# Patient Record
Sex: Female | Born: 1960 | Race: White | Hispanic: No | Smoking: Former smoker
Health system: Southern US, Community
[De-identification: ages and names within clinical notes are randomized; demographics above are authoritative.]

## PROBLEM LIST (undated history)

## (undated) ENCOUNTER — Emergency Department (HOSPITAL_COMMUNITY): Payer: Medicaid Other

## (undated) DIAGNOSIS — I219 Acute myocardial infarction, unspecified: Secondary | ICD-10-CM

## (undated) DIAGNOSIS — J439 Emphysema, unspecified: Secondary | ICD-10-CM

## (undated) DIAGNOSIS — Z9889 Other specified postprocedural states: Secondary | ICD-10-CM

## (undated) DIAGNOSIS — F319 Bipolar disorder, unspecified: Secondary | ICD-10-CM

## (undated) DIAGNOSIS — IMO0001 Reserved for inherently not codable concepts without codable children: Secondary | ICD-10-CM

## (undated) DIAGNOSIS — E039 Hypothyroidism, unspecified: Secondary | ICD-10-CM

## (undated) DIAGNOSIS — S82002A Unspecified fracture of left patella, initial encounter for closed fracture: Secondary | ICD-10-CM

## (undated) DIAGNOSIS — I509 Heart failure, unspecified: Secondary | ICD-10-CM

## (undated) DIAGNOSIS — M549 Dorsalgia, unspecified: Secondary | ICD-10-CM

## (undated) DIAGNOSIS — Z9981 Dependence on supplemental oxygen: Secondary | ICD-10-CM

## (undated) DIAGNOSIS — M1990 Unspecified osteoarthritis, unspecified site: Secondary | ICD-10-CM

## (undated) DIAGNOSIS — S42301A Unspecified fracture of shaft of humerus, right arm, initial encounter for closed fracture: Secondary | ICD-10-CM

## (undated) DIAGNOSIS — S4291XA Fracture of right shoulder girdle, part unspecified, initial encounter for closed fracture: Secondary | ICD-10-CM

## (undated) DIAGNOSIS — R569 Unspecified convulsions: Secondary | ICD-10-CM

## (undated) DIAGNOSIS — K219 Gastro-esophageal reflux disease without esophagitis: Secondary | ICD-10-CM

## (undated) DIAGNOSIS — F339 Major depressive disorder, recurrent, unspecified: Secondary | ICD-10-CM

## (undated) DIAGNOSIS — G8929 Other chronic pain: Secondary | ICD-10-CM

## (undated) DIAGNOSIS — I050 Rheumatic mitral stenosis: Secondary | ICD-10-CM

## (undated) DIAGNOSIS — J449 Chronic obstructive pulmonary disease, unspecified: Secondary | ICD-10-CM

## (undated) DIAGNOSIS — R112 Nausea with vomiting, unspecified: Secondary | ICD-10-CM

## (undated) DIAGNOSIS — D696 Thrombocytopenia, unspecified: Secondary | ICD-10-CM

## (undated) DIAGNOSIS — I10 Essential (primary) hypertension: Secondary | ICD-10-CM

## (undated) DIAGNOSIS — G629 Polyneuropathy, unspecified: Secondary | ICD-10-CM

## (undated) DIAGNOSIS — F411 Generalized anxiety disorder: Secondary | ICD-10-CM

## (undated) DIAGNOSIS — E119 Type 2 diabetes mellitus without complications: Secondary | ICD-10-CM

## (undated) DIAGNOSIS — F4001 Agoraphobia with panic disorder: Secondary | ICD-10-CM

## (undated) DIAGNOSIS — Z720 Tobacco use: Secondary | ICD-10-CM

## (undated) DIAGNOSIS — Z9289 Personal history of other medical treatment: Secondary | ICD-10-CM

## (undated) DIAGNOSIS — D649 Anemia, unspecified: Secondary | ICD-10-CM

## (undated) DIAGNOSIS — I00 Rheumatic fever without heart involvement: Secondary | ICD-10-CM

## (undated) DIAGNOSIS — E7800 Pure hypercholesterolemia, unspecified: Secondary | ICD-10-CM

## (undated) DIAGNOSIS — R011 Cardiac murmur, unspecified: Secondary | ICD-10-CM

## (undated) DIAGNOSIS — J189 Pneumonia, unspecified organism: Secondary | ICD-10-CM

## (undated) HISTORY — PX: COLON SURGERY: SHX602

## (undated) HISTORY — PX: KNEE SURGERY: SHX244

## (undated) HISTORY — PX: INGUINAL HERNIA REPAIR: SUR1180

## (undated) HISTORY — PX: BACK SURGERY: SHX140

## (undated) HISTORY — PX: PATELLA FRACTURE SURGERY: SHX735

## (undated) HISTORY — PX: ORIF ANKLE FRACTURE: SUR919

## (undated) HISTORY — PX: SHOULDER SURGERY: SHX246

## (undated) HISTORY — PX: TONSILLECTOMY AND ADENOIDECTOMY: SUR1326

## (undated) HISTORY — PX: FOOT SURGERY: SHX648

## (undated) HISTORY — PX: TUBAL LIGATION: SHX77

---

## 1998-01-19 ENCOUNTER — Other Ambulatory Visit: Admission: RE | Admit: 1998-01-19 | Discharge: 1998-01-19 | Payer: Self-pay | Admitting: Family Medicine

## 1999-08-14 ENCOUNTER — Other Ambulatory Visit: Admission: RE | Admit: 1999-08-14 | Discharge: 1999-08-14 | Payer: Self-pay | Admitting: Family Medicine

## 2000-02-22 ENCOUNTER — Encounter: Payer: Self-pay | Admitting: Emergency Medicine

## 2000-02-22 ENCOUNTER — Inpatient Hospital Stay (HOSPITAL_COMMUNITY): Admission: EM | Admit: 2000-02-22 | Discharge: 2000-02-27 | Payer: Self-pay | Admitting: *Deleted

## 2000-10-20 ENCOUNTER — Encounter: Payer: Self-pay | Admitting: Ophthalmology

## 2000-10-20 ENCOUNTER — Ambulatory Visit (HOSPITAL_COMMUNITY): Admission: RE | Admit: 2000-10-20 | Discharge: 2000-10-20 | Payer: Self-pay | Admitting: Ophthalmology

## 2000-10-22 ENCOUNTER — Ambulatory Visit (HOSPITAL_COMMUNITY): Admission: RE | Admit: 2000-10-22 | Discharge: 2000-10-23 | Payer: Self-pay | Admitting: Ophthalmology

## 2001-02-15 ENCOUNTER — Emergency Department (HOSPITAL_COMMUNITY): Admission: EM | Admit: 2001-02-15 | Discharge: 2001-02-15 | Payer: Self-pay | Admitting: *Deleted

## 2001-06-07 ENCOUNTER — Emergency Department (HOSPITAL_COMMUNITY): Admission: EM | Admit: 2001-06-07 | Discharge: 2001-06-07 | Payer: Self-pay | Admitting: Emergency Medicine

## 2002-01-21 ENCOUNTER — Encounter: Payer: Self-pay | Admitting: Emergency Medicine

## 2002-01-21 ENCOUNTER — Emergency Department (HOSPITAL_COMMUNITY): Admission: EM | Admit: 2002-01-21 | Discharge: 2002-01-21 | Payer: Self-pay | Admitting: Emergency Medicine

## 2002-01-29 ENCOUNTER — Ambulatory Visit (HOSPITAL_BASED_OUTPATIENT_CLINIC_OR_DEPARTMENT_OTHER): Admission: RE | Admit: 2002-01-29 | Discharge: 2002-01-30 | Payer: Self-pay | Admitting: Orthopedic Surgery

## 2002-03-19 ENCOUNTER — Ambulatory Visit: Admission: RE | Admit: 2002-03-19 | Discharge: 2002-03-19 | Payer: Self-pay | Admitting: Orthopedic Surgery

## 2002-09-20 ENCOUNTER — Emergency Department (HOSPITAL_COMMUNITY): Admission: EM | Admit: 2002-09-20 | Discharge: 2002-09-20 | Payer: Self-pay | Admitting: Emergency Medicine

## 2002-09-20 ENCOUNTER — Encounter: Payer: Self-pay | Admitting: Emergency Medicine

## 2002-10-13 ENCOUNTER — Encounter: Payer: Self-pay | Admitting: Family Medicine

## 2002-10-13 ENCOUNTER — Encounter (INDEPENDENT_AMBULATORY_CARE_PROVIDER_SITE_OTHER): Payer: Self-pay | Admitting: Specialist

## 2002-10-13 ENCOUNTER — Encounter: Admission: RE | Admit: 2002-10-13 | Discharge: 2002-10-13 | Payer: Self-pay | Admitting: Family Medicine

## 2003-02-10 ENCOUNTER — Emergency Department (HOSPITAL_COMMUNITY): Admission: EM | Admit: 2003-02-10 | Discharge: 2003-02-10 | Payer: Self-pay | Admitting: Emergency Medicine

## 2003-04-07 ENCOUNTER — Ambulatory Visit (HOSPITAL_COMMUNITY): Admission: RE | Admit: 2003-04-07 | Discharge: 2003-04-07 | Payer: Self-pay | Admitting: Family Medicine

## 2003-04-07 ENCOUNTER — Encounter: Payer: Self-pay | Admitting: Family Medicine

## 2003-06-15 ENCOUNTER — Ambulatory Visit (HOSPITAL_COMMUNITY): Admission: RE | Admit: 2003-06-15 | Discharge: 2003-06-15 | Payer: Self-pay | Admitting: Family Medicine

## 2003-06-15 ENCOUNTER — Encounter: Payer: Self-pay | Admitting: Family Medicine

## 2003-06-27 ENCOUNTER — Encounter: Payer: Self-pay | Admitting: Emergency Medicine

## 2003-06-27 ENCOUNTER — Emergency Department (HOSPITAL_COMMUNITY): Admission: EM | Admit: 2003-06-27 | Discharge: 2003-06-27 | Payer: Self-pay | Admitting: *Deleted

## 2003-12-05 ENCOUNTER — Other Ambulatory Visit: Admission: RE | Admit: 2003-12-05 | Discharge: 2003-12-05 | Payer: Self-pay | Admitting: Family Medicine

## 2003-12-21 ENCOUNTER — Encounter: Admission: RE | Admit: 2003-12-21 | Discharge: 2003-12-21 | Payer: Self-pay | Admitting: Family Medicine

## 2004-02-07 ENCOUNTER — Inpatient Hospital Stay (HOSPITAL_COMMUNITY): Admission: RE | Admit: 2004-02-07 | Discharge: 2004-02-15 | Payer: Self-pay | Admitting: Psychiatry

## 2004-05-01 ENCOUNTER — Emergency Department (HOSPITAL_COMMUNITY): Admission: EM | Admit: 2004-05-01 | Discharge: 2004-05-01 | Payer: Self-pay | Admitting: Emergency Medicine

## 2004-09-12 ENCOUNTER — Ambulatory Visit: Payer: Self-pay | Admitting: Family Medicine

## 2005-01-17 ENCOUNTER — Ambulatory Visit: Payer: Self-pay | Admitting: Family Medicine

## 2005-01-31 ENCOUNTER — Ambulatory Visit (HOSPITAL_COMMUNITY): Admission: RE | Admit: 2005-01-31 | Discharge: 2005-01-31 | Payer: Self-pay | Admitting: Family Medicine

## 2005-02-05 ENCOUNTER — Encounter (INDEPENDENT_AMBULATORY_CARE_PROVIDER_SITE_OTHER): Payer: Self-pay | Admitting: *Deleted

## 2005-02-05 ENCOUNTER — Inpatient Hospital Stay (HOSPITAL_COMMUNITY): Admission: RE | Admit: 2005-02-05 | Discharge: 2005-02-07 | Payer: Self-pay | Admitting: Orthopedic Surgery

## 2005-06-30 ENCOUNTER — Emergency Department (HOSPITAL_COMMUNITY): Admission: EM | Admit: 2005-06-30 | Discharge: 2005-07-01 | Payer: Self-pay | Admitting: Emergency Medicine

## 2005-07-17 ENCOUNTER — Encounter: Admission: RE | Admit: 2005-07-17 | Discharge: 2005-08-02 | Payer: Self-pay | Admitting: Orthopedic Surgery

## 2005-08-14 ENCOUNTER — Ambulatory Visit: Payer: Self-pay | Admitting: Family Medicine

## 2005-08-21 ENCOUNTER — Emergency Department (HOSPITAL_COMMUNITY): Admission: EM | Admit: 2005-08-21 | Discharge: 2005-08-21 | Payer: Self-pay | Admitting: Emergency Medicine

## 2005-09-05 ENCOUNTER — Ambulatory Visit: Payer: Self-pay | Admitting: Family Medicine

## 2005-09-23 DIAGNOSIS — I219 Acute myocardial infarction, unspecified: Secondary | ICD-10-CM

## 2005-09-23 HISTORY — DX: Acute myocardial infarction, unspecified: I21.9

## 2005-11-22 ENCOUNTER — Ambulatory Visit: Payer: Self-pay | Admitting: Family Medicine

## 2006-06-17 ENCOUNTER — Inpatient Hospital Stay (HOSPITAL_COMMUNITY): Admission: RE | Admit: 2006-06-17 | Discharge: 2006-06-20 | Payer: Self-pay | Admitting: Orthopedic Surgery

## 2006-06-20 ENCOUNTER — Emergency Department (HOSPITAL_COMMUNITY): Admission: EM | Admit: 2006-06-20 | Discharge: 2006-06-21 | Payer: Self-pay | Admitting: Emergency Medicine

## 2006-07-01 ENCOUNTER — Ambulatory Visit: Payer: Self-pay | Admitting: Family Medicine

## 2006-07-18 ENCOUNTER — Encounter: Admission: RE | Admit: 2006-07-18 | Discharge: 2006-09-25 | Payer: Self-pay | Admitting: Orthopedic Surgery

## 2006-11-07 ENCOUNTER — Emergency Department (HOSPITAL_COMMUNITY): Admission: EM | Admit: 2006-11-07 | Discharge: 2006-11-08 | Payer: Self-pay | Admitting: Emergency Medicine

## 2006-12-04 ENCOUNTER — Ambulatory Visit: Payer: Self-pay | Admitting: Cardiology

## 2006-12-04 ENCOUNTER — Inpatient Hospital Stay (HOSPITAL_COMMUNITY): Admission: EM | Admit: 2006-12-04 | Discharge: 2006-12-07 | Payer: Self-pay | Admitting: Emergency Medicine

## 2006-12-11 ENCOUNTER — Ambulatory Visit: Payer: Self-pay | Admitting: Cardiovascular Disease

## 2006-12-19 ENCOUNTER — Inpatient Hospital Stay (HOSPITAL_COMMUNITY): Admission: RE | Admit: 2006-12-19 | Discharge: 2006-12-26 | Payer: Self-pay | Admitting: Orthopedic Surgery

## 2006-12-27 ENCOUNTER — Emergency Department (HOSPITAL_COMMUNITY): Admission: EM | Admit: 2006-12-27 | Discharge: 2006-12-27 | Payer: Self-pay | Admitting: *Deleted

## 2007-01-06 ENCOUNTER — Ambulatory Visit: Payer: Self-pay | Admitting: Family Medicine

## 2007-03-23 ENCOUNTER — Ambulatory Visit: Payer: Self-pay | Admitting: Family Medicine

## 2007-07-15 ENCOUNTER — Emergency Department (HOSPITAL_COMMUNITY): Admission: EM | Admit: 2007-07-15 | Discharge: 2007-07-15 | Payer: Self-pay | Admitting: Emergency Medicine

## 2007-08-17 ENCOUNTER — Encounter: Admission: RE | Admit: 2007-08-17 | Discharge: 2007-08-17 | Payer: Self-pay | Admitting: Orthopedic Surgery

## 2007-08-27 ENCOUNTER — Encounter: Admission: RE | Admit: 2007-08-27 | Discharge: 2007-11-25 | Payer: Self-pay | Admitting: Family Medicine

## 2007-09-22 ENCOUNTER — Inpatient Hospital Stay (HOSPITAL_COMMUNITY): Admission: RE | Admit: 2007-09-22 | Discharge: 2007-10-05 | Payer: Self-pay | Admitting: Orthopedic Surgery

## 2007-09-29 ENCOUNTER — Encounter (INDEPENDENT_AMBULATORY_CARE_PROVIDER_SITE_OTHER): Payer: Self-pay | Admitting: Orthopedic Surgery

## 2007-12-04 ENCOUNTER — Emergency Department (HOSPITAL_COMMUNITY): Admission: EM | Admit: 2007-12-04 | Discharge: 2007-12-05 | Payer: Self-pay | Admitting: Emergency Medicine

## 2007-12-15 ENCOUNTER — Inpatient Hospital Stay (HOSPITAL_COMMUNITY): Admission: RE | Admit: 2007-12-15 | Discharge: 2007-12-22 | Payer: Self-pay | Admitting: Orthopedic Surgery

## 2008-01-12 ENCOUNTER — Inpatient Hospital Stay (HOSPITAL_COMMUNITY): Admission: EM | Admit: 2008-01-12 | Discharge: 2008-01-14 | Payer: Self-pay | Admitting: Emergency Medicine

## 2008-02-18 ENCOUNTER — Encounter: Admission: RE | Admit: 2008-02-18 | Discharge: 2008-02-18 | Payer: Self-pay | Admitting: Orthopedic Surgery

## 2008-02-18 ENCOUNTER — Inpatient Hospital Stay (HOSPITAL_COMMUNITY): Admission: EM | Admit: 2008-02-18 | Discharge: 2008-02-22 | Payer: Self-pay | Admitting: Emergency Medicine

## 2008-02-29 ENCOUNTER — Observation Stay (HOSPITAL_COMMUNITY): Admission: EM | Admit: 2008-02-29 | Discharge: 2008-03-01 | Payer: Self-pay | Admitting: Emergency Medicine

## 2008-02-29 ENCOUNTER — Ambulatory Visit: Payer: Self-pay | Admitting: Surgery

## 2008-04-05 ENCOUNTER — Emergency Department (HOSPITAL_COMMUNITY): Admission: EM | Admit: 2008-04-05 | Discharge: 2008-04-05 | Payer: Self-pay | Admitting: Emergency Medicine

## 2008-06-07 ENCOUNTER — Emergency Department (HOSPITAL_COMMUNITY): Admission: EM | Admit: 2008-06-07 | Discharge: 2008-06-07 | Payer: Self-pay | Admitting: Emergency Medicine

## 2008-06-29 ENCOUNTER — Ambulatory Visit: Payer: Self-pay | Admitting: Surgery

## 2008-07-11 ENCOUNTER — Emergency Department (HOSPITAL_COMMUNITY): Admission: EM | Admit: 2008-07-11 | Discharge: 2008-07-12 | Payer: Self-pay | Admitting: Emergency Medicine

## 2008-07-12 ENCOUNTER — Inpatient Hospital Stay (HOSPITAL_COMMUNITY): Admission: EM | Admit: 2008-07-12 | Discharge: 2008-07-20 | Payer: Self-pay | Admitting: Emergency Medicine

## 2008-07-19 ENCOUNTER — Encounter (INDEPENDENT_AMBULATORY_CARE_PROVIDER_SITE_OTHER): Payer: Self-pay | Admitting: Gastroenterology

## 2008-08-16 ENCOUNTER — Inpatient Hospital Stay (HOSPITAL_COMMUNITY): Admission: RE | Admit: 2008-08-16 | Discharge: 2008-08-20 | Payer: Self-pay | Admitting: Orthopedic Surgery

## 2008-10-06 ENCOUNTER — Emergency Department (HOSPITAL_COMMUNITY): Admission: EM | Admit: 2008-10-06 | Discharge: 2008-10-07 | Payer: Self-pay | Admitting: Emergency Medicine

## 2009-08-07 ENCOUNTER — Other Ambulatory Visit: Payer: Self-pay | Admitting: Emergency Medicine

## 2009-08-07 ENCOUNTER — Ambulatory Visit: Payer: Self-pay | Admitting: Psychiatry

## 2009-08-07 ENCOUNTER — Inpatient Hospital Stay (HOSPITAL_COMMUNITY): Admission: AD | Admit: 2009-08-07 | Discharge: 2009-08-14 | Payer: Self-pay | Admitting: Psychiatry

## 2009-09-07 ENCOUNTER — Emergency Department (HOSPITAL_COMMUNITY): Admission: EM | Admit: 2009-09-07 | Discharge: 2009-09-07 | Payer: Self-pay | Admitting: Emergency Medicine

## 2009-09-09 ENCOUNTER — Emergency Department (HOSPITAL_COMMUNITY): Admission: EM | Admit: 2009-09-09 | Discharge: 2009-09-09 | Payer: Self-pay | Admitting: Emergency Medicine

## 2009-10-16 ENCOUNTER — Emergency Department (HOSPITAL_COMMUNITY): Admission: EM | Admit: 2009-10-16 | Discharge: 2009-10-17 | Payer: Self-pay | Admitting: Emergency Medicine

## 2010-03-20 ENCOUNTER — Inpatient Hospital Stay (HOSPITAL_COMMUNITY): Admission: EM | Admit: 2010-03-20 | Discharge: 2010-03-29 | Payer: Self-pay | Admitting: Emergency Medicine

## 2010-07-10 IMAGING — CT CT HEAD W/O CM
4 of 6 series · 14 of 30 positions shown, 16 images · non-contrast
Comparison: [DATE]

CLINICAL DATA: Altered level of consciousness.  Confusion.
Diabetic ketoacidosis.

CT HEAD WITHOUT CONTRAST
TECHNIQUE: Contiguous axial images were obtained from the base of
the skull through the vertex without contrast.

[Series 2: head_seq 4.5 h37s st · axial · 0.43mm/px · z∈[+904,+1030]mm · 8 of 36 slices shown, 10 images (1 of 4)]
[im 4/36  brain]
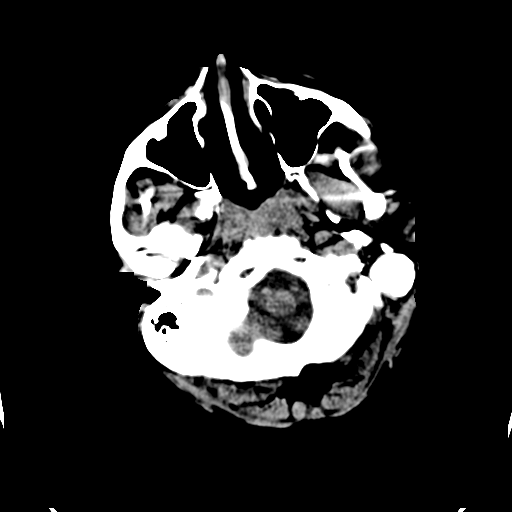
[im 4/36  bone]
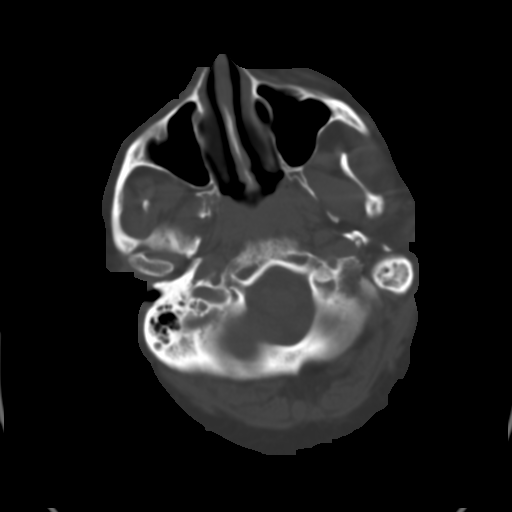
[im 8/36  brain]
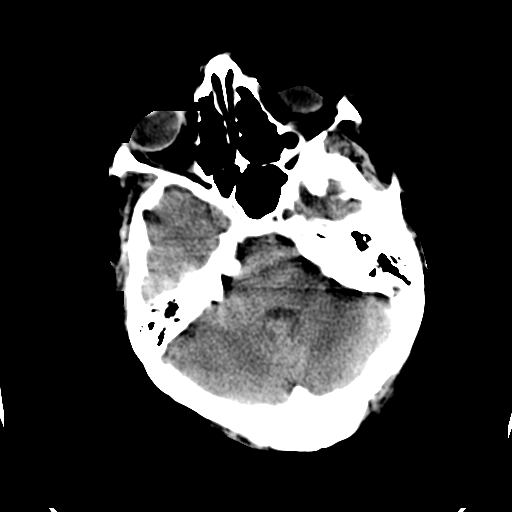
[im 12/36  brain]
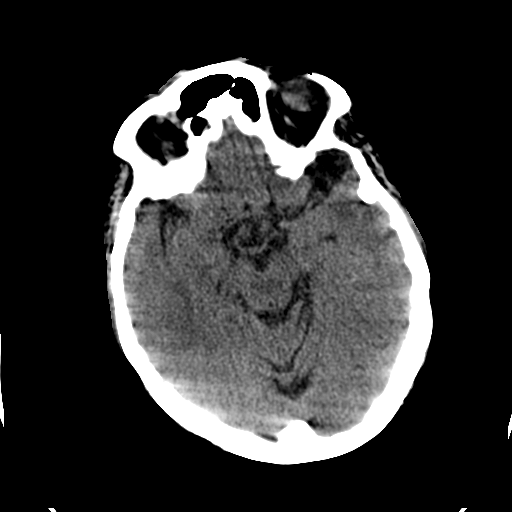
[im 16/36  brain]
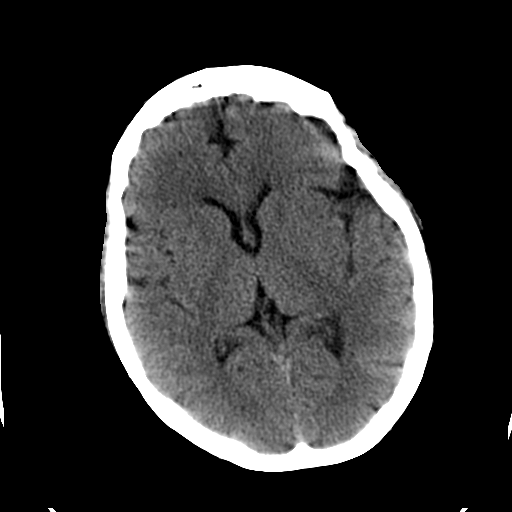
[im 20/36  brain]
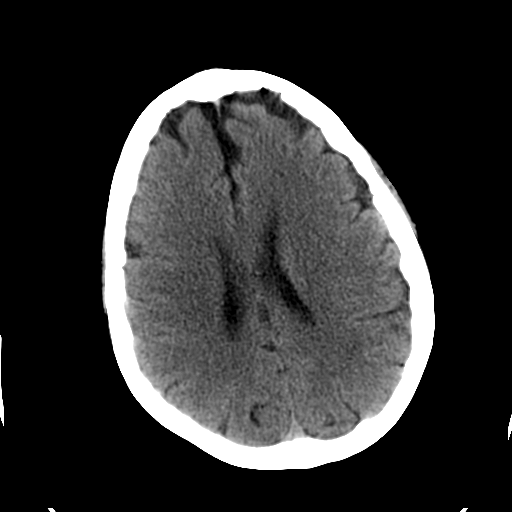
[im 20/36  bone]
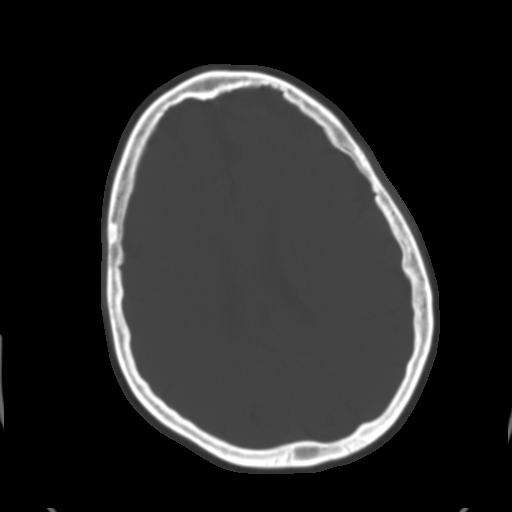
[im 24/36  brain]
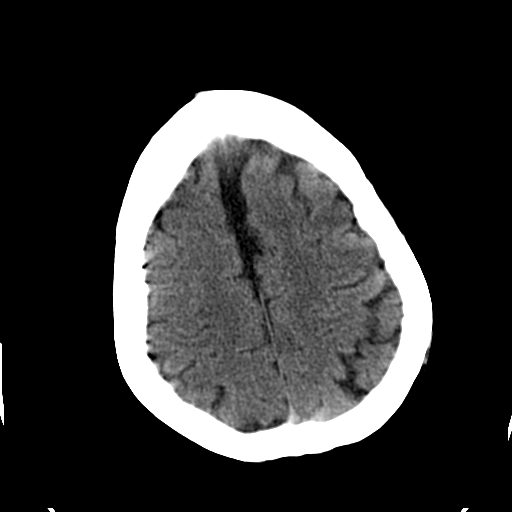
[im 28/36  brain]
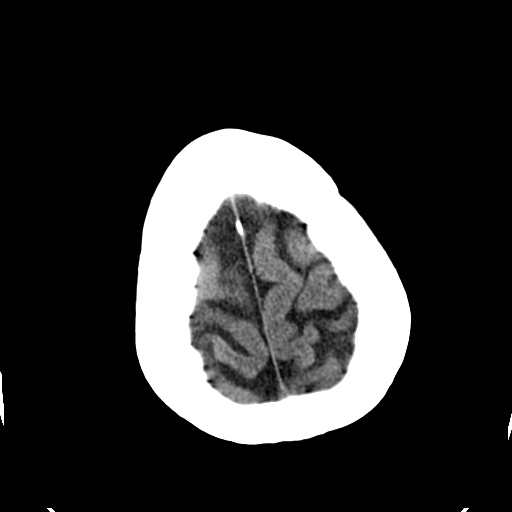
[im 32/36  brain]
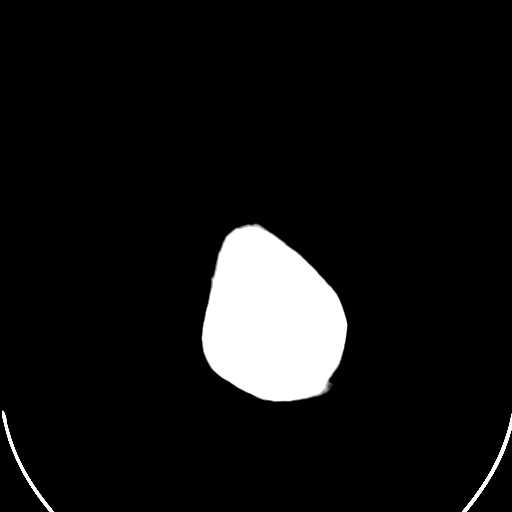

[Series 3: head_seq 4.5 h37s st · axial · 0.43mm/px · z∈[+913,+935]mm · 2 of 16 slices shown (2 of 4)]
[im 6/16  brain]
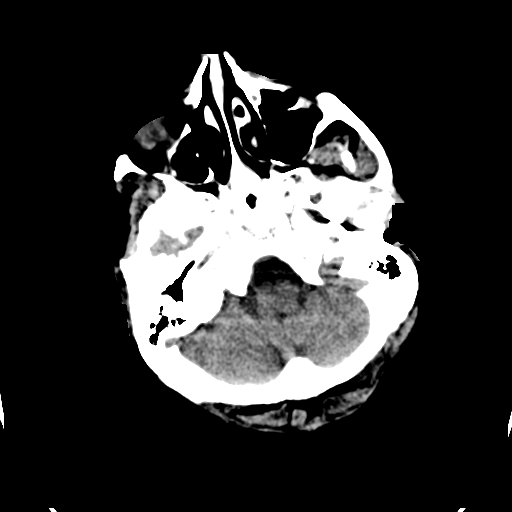
[im 11/16  brain]
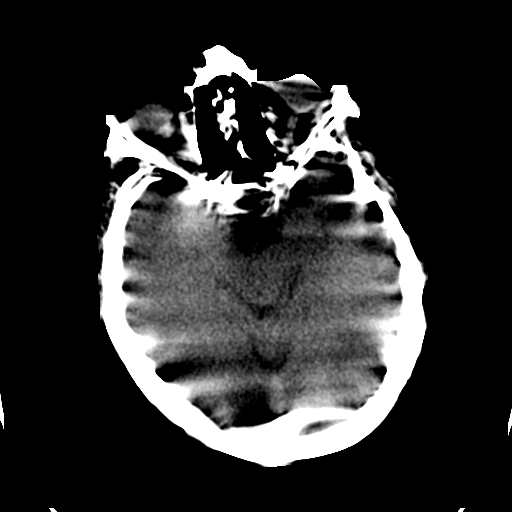

[Series 4: head_seq 4.5 h37s st · axial · 0.43mm/px · z∈[+913,+935]mm · 2 of 16 slices shown (3 of 4)]
[im 6/16  brain]
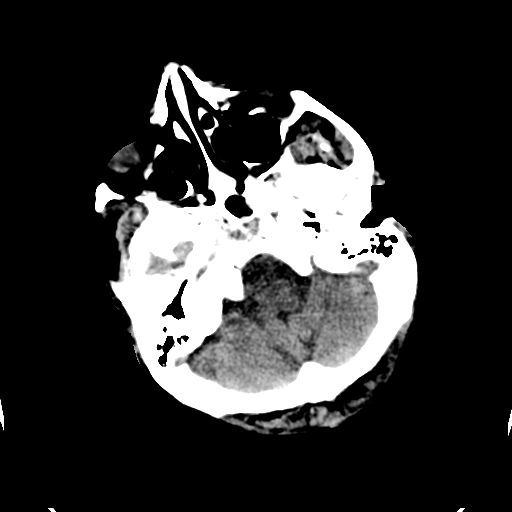
[im 11/16  brain]
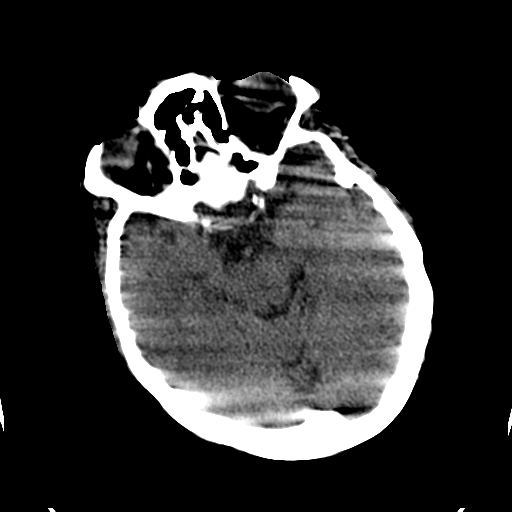

[Series 5: head_seq 4.5 h37s st · axial · 0.43mm/px · z∈[+913,+935]mm · 2 of 16 slices shown (4 of 4)]
[im 6/16  brain]
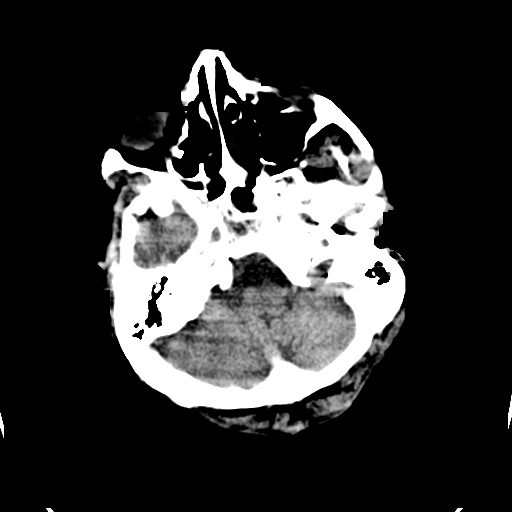
[im 11/16  brain]
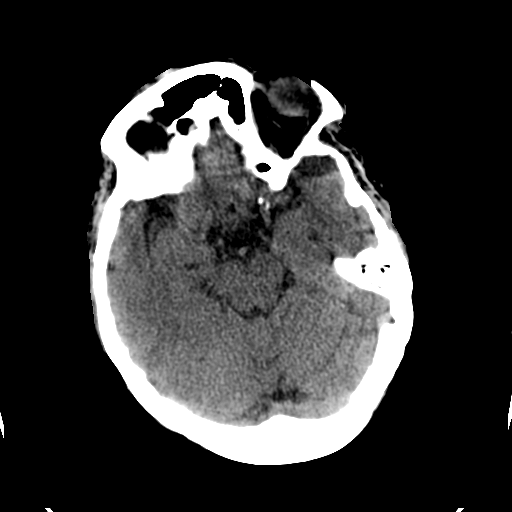

[14 of 30 positions shown; findings below may reference images not displayed]

FINDINGS: No change since previous examination.  There is mild
generalized atrophy but no evidence of acute infarction, mass
lesion, hemorrhage, hydrocephalus or extra-axial collection.
Calvarium is unremarkable.  The sinuses, middle ears and mastoids
are clear.
IMPRESSION: No change or acute finding.  Mild brain atrophy.

## 2010-10-13 ENCOUNTER — Encounter: Payer: Self-pay | Admitting: Internal Medicine

## 2010-10-15 ENCOUNTER — Encounter: Payer: Self-pay | Admitting: Orthopedic Surgery

## 2010-12-09 LAB — BASIC METABOLIC PANEL
BUN: 6 mg/dL (ref 6–23)
BUN: 15 mg/dL (ref 6–23)
Calcium: 9.2 mg/dL (ref 8.4–10.5)
Chloride: 102 mEq/L (ref 96–112)
Chloride: 84 mEq/L — ABNORMAL LOW (ref 96–112)
Creatinine, Ser: 0.51 mg/dL (ref 0.4–1.2)
Creatinine, Ser: 1.47 mg/dL — ABNORMAL HIGH (ref 0.4–1.2)
GFR calc Af Amer: >60 mL/min (ref >60)
GFR calc non Af Amer: >60 mL/min (ref >60)

## 2010-12-09 LAB — COMPREHENSIVE METABOLIC PANEL
AST: 12 U/L (ref 0–37)
AST: 14 U/L (ref 0–37)
Albumin: 3 g/dL — ABNORMAL LOW (ref 3.5–5.2)
Albumin: 3.2 g/dL — ABNORMAL LOW (ref 3.5–5.2)
Albumin: 3.3 g/dL — ABNORMAL LOW (ref 3.5–5.2)
Albumin: 3.3 g/dL — ABNORMAL LOW (ref 3.5–5.2)
Albumin: 3.6 g/dL (ref 3.5–5.2)
Alkaline Phosphatase: 86 U/L (ref 39–117)
Alkaline Phosphatase: 89 U/L (ref 39–117)
BUN: 11 mg/dL (ref 6–23)
BUN: 5 mg/dL — ABNORMAL LOW (ref 6–23)
BUN: 6 mg/dL (ref 6–23)
BUN: 8 mg/dL (ref 6–23)
Calcium: 8.5 mg/dL (ref 8.4–10.5)
Calcium: 8.9 mg/dL (ref 8.4–10.5)
Calcium: 9.3 mg/dL (ref 8.4–10.5)
Chloride: 96 mEq/L (ref 96–112)
Chloride: 97 mEq/L (ref 96–112)
Creatinine, Ser: 0.5 mg/dL (ref 0.4–1.2)
Creatinine, Ser: 0.55 mg/dL (ref 0.4–1.2)
GFR calc Af Amer: >60 mL/min (ref >60)
GFR calc Af Amer: >60 mL/min (ref >60)
GFR calc non Af Amer: >60 mL/min (ref >60)
Glucose, Bld: 147 mg/dL — ABNORMAL HIGH (ref 70–99)
Glucose, Bld: 127 mg/dL — ABNORMAL HIGH (ref 70–99)
Potassium: 2.8 mEq/L — ABNORMAL LOW (ref 3.5–5.1)
Potassium: 3.3 mEq/L — ABNORMAL LOW (ref 3.5–5.1)
Sodium: 136 mEq/L (ref 135–145)
Sodium: 131 mEq/L — ABNORMAL LOW (ref 135–145)
Total Bilirubin: 0.9 mg/dL (ref 0.3–1.2)
Total Bilirubin: 1 mg/dL (ref 0.3–1.2)
Total Bilirubin: 1.3 mg/dL — ABNORMAL HIGH (ref 0.3–1.2)
Total Protein: 5.5 g/dL — ABNORMAL LOW (ref 6.0–8.3)
Total Protein: 6.5 g/dL (ref 6.0–8.3)

## 2010-12-09 LAB — GLUCOSE, CAPILLARY
Glucose-Capillary: 122 mg/dL — ABNORMAL HIGH (ref 70–99)
Glucose-Capillary: 132 mg/dL — ABNORMAL HIGH (ref 70–99)
Glucose-Capillary: 158 mg/dL — ABNORMAL HIGH (ref 70–99)
Glucose-Capillary: 159 mg/dL — ABNORMAL HIGH (ref 70–99)
Glucose-Capillary: 159 mg/dL — ABNORMAL HIGH (ref 70–99)
Glucose-Capillary: 171 mg/dL — ABNORMAL HIGH (ref 70–99)
Glucose-Capillary: 193 mg/dL — ABNORMAL HIGH (ref 70–99)
Glucose-Capillary: 198 mg/dL — ABNORMAL HIGH (ref 70–99)
Glucose-Capillary: 207 mg/dL — ABNORMAL HIGH (ref 70–99)
Glucose-Capillary: 209 mg/dL — ABNORMAL HIGH (ref 70–99)
Glucose-Capillary: 210 mg/dL — ABNORMAL HIGH (ref 70–99)
Glucose-Capillary: 258 mg/dL — ABNORMAL HIGH (ref 70–99)
Glucose-Capillary: 267 mg/dL — ABNORMAL HIGH (ref 70–99)
Glucose-Capillary: 281 mg/dL — ABNORMAL HIGH (ref 70–99)
Glucose-Capillary: 292 mg/dL — ABNORMAL HIGH (ref 70–99)
Glucose-Capillary: 317 mg/dL — ABNORMAL HIGH (ref 70–99)
Glucose-Capillary: 329 mg/dL — ABNORMAL HIGH (ref 70–99)
Glucose-Capillary: 51 mg/dL — ABNORMAL LOW (ref 70–99)
Glucose-Capillary: 52 mg/dL — ABNORMAL LOW (ref 70–99)
Glucose-Capillary: 60 mg/dL — ABNORMAL LOW (ref 70–99)
Glucose-Capillary: 89 mg/dL (ref 70–99)
Glucose-Capillary: 231 mg/dL — ABNORMAL HIGH (ref 70–99)
Glucose-Capillary: 314 mg/dL — ABNORMAL HIGH (ref 70–99)
Glucose-Capillary: 131 mg/dL — ABNORMAL HIGH (ref 70–99)
Glucose-Capillary: 244 mg/dL — ABNORMAL HIGH (ref 70–99)

## 2010-12-09 LAB — DIFFERENTIAL
Basophils Absolute: 0 10*3/uL (ref 0.0–0.1)
Basophils Absolute: 0 10*3/uL (ref 0.0–0.1)
Basophils Absolute: 0 10*3/uL (ref 0.0–0.1)
Basophils Absolute: 0 10*3/uL (ref 0.0–0.1)
Basophils Absolute: 0.1 10*3/uL (ref 0.0–0.1)
Basophils Relative: 0 % (ref 0–1)
Basophils Relative: 1 % (ref 0–1)
Eosinophils Relative: 2 % (ref 0–5)
Eosinophils Relative: 3 % (ref 0–5)
Lymphocytes Relative: 22 % (ref 12–46)
Lymphocytes Relative: 23 % (ref 12–46)
Lymphocytes Relative: 23 % (ref 12–46)
Lymphs Abs: 1.8 10*3/uL (ref 0.7–4.0)
Lymphs Abs: 1.9 10*3/uL (ref 0.7–4.0)
Lymphs Abs: 1.9 10*3/uL (ref 0.7–4.0)
Lymphs Abs: 0.9 10*3/uL (ref 0.7–4.0)
Monocytes Absolute: 0.5 10*3/uL (ref 0.1–1.0)
Monocytes Absolute: 0.6 10*3/uL (ref 0.1–1.0)
Monocytes Absolute: 0.5 10*3/uL (ref 0.1–1.0)
Monocytes Absolute: 0.6 10*3/uL (ref 0.1–1.0)
Monocytes Relative: 7 % (ref 3–12)
Monocytes Relative: 7 % (ref 3–12)
Monocytes Relative: 4 % (ref 3–12)
Neutro Abs: 4.7 10*3/uL (ref 1.7–7.7)
Neutro Abs: 5.2 10*3/uL (ref 1.7–7.7)
Neutro Abs: 5.4 10*3/uL (ref 1.7–7.7)
Neutro Abs: 5.7 10*3/uL (ref 1.7–7.7)
Neutro Abs: 10.4 10*3/uL — ABNORMAL HIGH (ref 1.7–7.7)
Neutro Abs: 10.9 10*3/uL — ABNORMAL HIGH (ref 1.7–7.7)
Neutro Abs: 8.3 10*3/uL — ABNORMAL HIGH (ref 1.7–7.7)
Neutrophils Relative %: 66 % (ref 43–77)
Neutrophils Relative %: 66 % (ref 43–77)
Neutrophils Relative %: 68 % (ref 43–77)
Neutrophils Relative %: 80 % — ABNORMAL HIGH (ref 43–77)
Neutrophils Relative %: 87 % — ABNORMAL HIGH (ref 43–77)
Neutrophils Relative %: 88 % — ABNORMAL HIGH (ref 43–77)

## 2010-12-09 LAB — CBC
HCT: 41.7 % (ref 36.0–46.0)
HCT: 36.6 % (ref 36.0–46.0)
HCT: 40.4 % (ref 36.0–46.0)
Hemoglobin: 12.8 g/dL (ref 12.0–15.0)
Hemoglobin: 13.8 g/dL (ref 12.0–15.0)
MCH: 31.4 pg (ref 26.0–34.0)
MCH: 31.4 pg (ref 26.0–34.0)
MCH: 31 pg (ref 26.0–34.0)
MCHC: 34.1 g/dL (ref 30.0–36.0)
MCHC: 34.5 g/dL (ref 30.0–36.0)
MCHC: 34.7 g/dL (ref 30.0–36.0)
MCV: 90.7 fL (ref 78.0–100.0)
MCV: 91 fL (ref 78.0–100.0)
MCV: 90.6 fL (ref 78.0–100.0)
Platelets: 166 10*3/uL (ref 150–400)
Platelets: 168 10*3/uL (ref 150–400)
Platelets: 193 10*3/uL (ref 150–400)
Platelets: 214 10*3/uL (ref 150–400)
Platelets: 181 10*3/uL (ref 150–400)
RBC: 4.31 MIL/uL (ref 3.87–5.11)
RBC: 4.03 MIL/uL (ref 3.87–5.11)
RBC: 4.37 MIL/uL (ref 3.87–5.11)
RBC: 4.44 MIL/uL (ref 3.87–5.11)
RDW: 12.4 % (ref 11.5–15.5)
RDW: 12.4 % (ref 11.5–15.5)
RDW: 12.4 % (ref 11.5–15.5)
RDW: 12.8 % (ref 11.5–15.5)
WBC: 6.9 10*3/uL (ref 4.0–10.5)
WBC: 8.2 10*3/uL (ref 4.0–10.5)
WBC: 10.4 10*3/uL (ref 4.0–10.5)
WBC: 12.1 10*3/uL — ABNORMAL HIGH (ref 4.0–10.5)

## 2010-12-09 LAB — LIPID PANEL
Cholesterol: 176 mg/dL (ref 0–200)
Total CHOL/HDL Ratio: 3.1 RATIO

## 2010-12-09 LAB — URINALYSIS, ROUTINE W REFLEX MICROSCOPIC
Hgb urine dipstick: NEGATIVE
Nitrite: NEGATIVE
Specific Gravity, Urine: 1.01 (ref 1.005–1.030)
Urobilinogen, UA: 0.2 mg/dL (ref 0.0–1.0)

## 2010-12-09 LAB — MAGNESIUM: Magnesium: 1.8 mg/dL (ref 1.5–2.5)

## 2010-12-09 LAB — HEMOGLOBIN A1C: Hgb A1c MFr Bld: 7.7 % — ABNORMAL HIGH (ref <5.7)

## 2010-12-24 LAB — GLUCOSE, CAPILLARY: Glucose-Capillary: 370 mg/dL — ABNORMAL HIGH (ref 70–99)

## 2010-12-24 LAB — BASIC METABOLIC PANEL
BUN: 6 mg/dL (ref 6–23)
CO2: 27 mEq/L (ref 19–32)
Chloride: 101 mEq/L (ref 96–112)
Creatinine, Ser: 0.65 mg/dL (ref 0.4–1.2)
Glucose, Bld: 168 mg/dL — ABNORMAL HIGH (ref 70–99)

## 2010-12-24 LAB — DIFFERENTIAL
Basophils Relative: 1 % (ref 0–1)
Eosinophils Absolute: 0.1 10*3/uL (ref 0.0–0.7)
Neutrophils Relative %: 73 % (ref 43–77)

## 2010-12-24 LAB — RAPID URINE DRUG SCREEN, HOSP PERFORMED
Benzodiazepines: NOT DETECTED
Cocaine: NOT DETECTED

## 2010-12-24 LAB — ETHANOL: Alcohol, Ethyl (B): <5 mg/dL (ref 0–10)

## 2010-12-24 LAB — POCT I-STAT, CHEM 8
Creatinine, Ser: 0.6 mg/dL (ref 0.4–1.2)
Glucose, Bld: 389 mg/dL — ABNORMAL HIGH (ref 70–99)
Hemoglobin: 16.7 g/dL — ABNORMAL HIGH (ref 12.0–15.0)
Potassium: 3.6 mEq/L (ref 3.5–5.1)

## 2010-12-24 LAB — CBC
MCHC: 34.3 g/dL (ref 30.0–36.0)
MCV: 89 fL (ref 78.0–100.0)
Platelets: 285 10*3/uL (ref 150–400)
RDW: 12.8 % (ref 11.5–15.5)
WBC: 8.7 10*3/uL (ref 4.0–10.5)

## 2010-12-26 LAB — URINALYSIS, ROUTINE W REFLEX MICROSCOPIC
Bilirubin Urine: NEGATIVE
Glucose, UA: NEGATIVE mg/dL
Hgb urine dipstick: NEGATIVE
Hgb urine dipstick: NEGATIVE
Ketones, ur: NEGATIVE mg/dL
Nitrite: NEGATIVE
Protein, ur: NEGATIVE mg/dL
Protein, ur: NEGATIVE mg/dL
Specific Gravity, Urine: 1.011 (ref 1.005–1.030)
Urobilinogen, UA: 0.2 mg/dL (ref 0.0–1.0)
Urobilinogen, UA: 1 mg/dL (ref 0.0–1.0)
pH: 8 (ref 5.0–8.0)

## 2010-12-26 LAB — CBC
HCT: 42.5 % (ref 36.0–46.0)
Hemoglobin: 12.7 g/dL (ref 12.0–15.0)
Hemoglobin: 14.5 g/dL (ref 12.0–15.0)
MCHC: 32.9 g/dL (ref 30.0–36.0)
MCHC: 34.2 g/dL (ref 30.0–36.0)
MCV: 88.2 fL (ref 78.0–100.0)
Platelets: 242 K/uL (ref 150–400)
RBC: 4.32 MIL/uL (ref 3.87–5.11)
RBC: 4.82 MIL/uL (ref 3.87–5.11)
RDW: 12.6 % (ref 11.5–15.5)
WBC: 7 10*3/uL (ref 4.0–10.5)
WBC: 9.5 10*3/uL (ref 4.0–10.5)

## 2010-12-26 LAB — BASIC METABOLIC PANEL WITH GFR
BUN: 9 mg/dL (ref 6–23)
CO2: 33 meq/L — ABNORMAL HIGH (ref 19–32)
Calcium: 9.5 mg/dL (ref 8.4–10.5)
Chloride: 98 meq/L (ref 96–112)
Creatinine, Ser: 0.7 mg/dL (ref 0.4–1.2)
Glucose, Bld: 90 mg/dL (ref 70–99)

## 2010-12-26 LAB — GLUCOSE, CAPILLARY
Glucose-Capillary: 139 mg/dL — ABNORMAL HIGH (ref 70–99)
Glucose-Capillary: 155 mg/dL — ABNORMAL HIGH (ref 70–99)
Glucose-Capillary: 156 mg/dL — ABNORMAL HIGH (ref 70–99)
Glucose-Capillary: 156 mg/dL — ABNORMAL HIGH (ref 70–99)
Glucose-Capillary: 173 mg/dL — ABNORMAL HIGH (ref 70–99)
Glucose-Capillary: 193 mg/dL — ABNORMAL HIGH (ref 70–99)
Glucose-Capillary: 198 mg/dL — ABNORMAL HIGH (ref 70–99)
Glucose-Capillary: 264 mg/dL — ABNORMAL HIGH (ref 70–99)
Glucose-Capillary: 264 mg/dL — ABNORMAL HIGH (ref 70–99)
Glucose-Capillary: 323 mg/dL — ABNORMAL HIGH (ref 70–99)
Glucose-Capillary: 361 mg/dL — ABNORMAL HIGH (ref 70–99)
Glucose-Capillary: 369 mg/dL — ABNORMAL HIGH (ref 70–99)
Glucose-Capillary: 97 mg/dL (ref 70–99)
Glucose-Capillary: 99 mg/dL (ref 70–99)

## 2010-12-26 LAB — POCT TOXICOLOGY PANEL: Benzodiazepines: POSITIVE

## 2010-12-26 LAB — BASIC METABOLIC PANEL
GFR calc Af Amer: >60 mL/min (ref >60)
GFR calc non Af Amer: >60 mL/min (ref >60)
Potassium: 3.3 mEq/L — ABNORMAL LOW (ref 3.5–5.1)
Sodium: 139 mEq/L (ref 135–145)

## 2010-12-26 LAB — ETHANOL: Alcohol, Ethyl (B): <5 mg/dL (ref 0–10)

## 2011-01-07 LAB — URINALYSIS, ROUTINE W REFLEX MICROSCOPIC
Hgb urine dipstick: NEGATIVE
Specific Gravity, Urine: 1.039 — ABNORMAL HIGH (ref 1.005–1.030)
Urobilinogen, UA: 0.2 mg/dL (ref 0.0–1.0)

## 2011-01-07 LAB — DIFFERENTIAL
Basophils Absolute: 0 10*3/uL (ref 0.0–0.1)
Basophils Relative: 0 % (ref 0–1)
Eosinophils Absolute: 0.2 10*3/uL (ref 0.0–0.7)
Eosinophils Relative: 3 % (ref 0–5)
Lymphocytes Relative: 23 % (ref 12–46)
Monocytes Absolute: 0.4 10*3/uL (ref 0.1–1.0)

## 2011-01-07 LAB — RAPID URINE DRUG SCREEN, HOSP PERFORMED
Amphetamines: NOT DETECTED
Opiates: POSITIVE — AB
Tetrahydrocannabinol: NOT DETECTED

## 2011-01-07 LAB — URINE MICROSCOPIC-ADD ON

## 2011-01-07 LAB — GLUCOSE, CAPILLARY: Glucose-Capillary: 214 mg/dL — ABNORMAL HIGH (ref 70–99)

## 2011-01-07 LAB — POCT I-STAT, CHEM 8
Calcium, Ion: 1.15 mmol/L (ref 1.12–1.32)
Glucose, Bld: 119 mg/dL — ABNORMAL HIGH (ref 70–99)
HCT: 40 % (ref 36.0–46.0)
Hemoglobin: 13.6 g/dL (ref 12.0–15.0)
TCO2: 27 mmol/L (ref 0–100)

## 2011-01-07 LAB — ACETAMINOPHEN LEVEL: Acetaminophen (Tylenol), Serum: 11.2 ug/mL (ref 10–30)

## 2011-01-07 LAB — CBC
HCT: 38.2 % (ref 36.0–46.0)
Hemoglobin: 12.6 g/dL (ref 12.0–15.0)
MCHC: 32.9 g/dL (ref 30.0–36.0)
MCV: 88.7 fL (ref 78.0–100.0)
Platelets: 266 10*3/uL (ref 150–400)
RDW: 13.9 % (ref 11.5–15.5)

## 2011-01-07 LAB — SALICYLATE LEVEL: Salicylate Lvl: <4 mg/dL (ref 2.8–20.0)

## 2011-02-05 NOTE — Discharge Summary (Signed)
NAMEELAISHA, ZAHNISER                ACCOUNT NO.:  [id number]   MEDICAL RECORD NO.:  [id number]          PATIENT TYPE:  INP   LOCATION:  1510                         FACILITY:  [facility]   PHYSICIAN:  Fleet Contras, M.D.    DATE OF BIRTH:  1961-01-27   DATE OF ADMISSION:  07/12/2008  DATE OF DISCHARGE:  07/20/2008                               DISCHARGE SUMMARY   HISTORY OF PRESENT ILLNESS:  Please see the dictated H and P for full  details of presentation.  In summary, Ms. Piscitelli is a 50 year old  Caucasian lady with past medical history significant for type 2 diabetes  mellitus, systemic hypertension, dyslipidemia, depression, anxiety,  herniated lumbar disk.  She presented to the emergency room at [facility] with progressively worsening confusion, disorientation and  delirium after she had been to the emergency room a second time.  Her  workup including CT scan of the head, urinalysis and chest x-ray  essentially unrevealing on the first presentation, but subsequently she  was found to have a glucose of over 900, with large ketones in her blood  and low bicarb and elevated BUN and creatinine.  She was in diabetic  ketoacidosis, therefore admitted to the intensive care unit.   HOSPITAL COURSE:  On admission, the patient was started on intravenous  fluids, received hydration, as well as a glucose stabilizer protocol  with hourly monitoring of CBGs, strict input and output monitoring.  Within 24 hours, her glucose seemed to have normalized and she was able  to be transitioned to Lantus insulin.  However, she did not wake up and  her delirium did not clear.  A repeat CT scan of the head was performed  and again this showed no acute intracranial lesions.  Neurology consult  was requested and the patient was kindly seen by Dr. Sharene Skeans, who  concurred that this was most likely metabolic delirium and very likely  will clear with time.  He requested an EEG which was performed and  showed slow waves, compatible with metabolic encephalopathy.  Other  tests performed during this time including ammonia levels, TSH, sed  rate, ANA all returned within normal limits.  In the next 24-hours, the  patient became more alert and awake and was able to communicate.  She  was therefore transferred to a medical bed.  She had a low potassium of  2.8, and this was repleted both orally, as well as intravenously.  When  she started feeding, she complained of severe odynophagia despite  treatment with Protonix, Carafate and Maalox.  A gastroenterology  consult was requested and the patient was kindly seen by Dr. Evette Cristal, who  recommended that the patient should have upper GI endoscopy.  At this  time, she has had ultrasound scan of the abdomen that revealed no  gallstones and upper GI series did not show any ulcers or other  abnormalities.  Upper GI endoscopy was performed by Dr. Bosie Clos on  July 19, 2008, and showed severe erosive esophagitis.  He recommended  the patient should be on intravenous Protonix 40 mg q.12 for 24 hours,  and then transitioned to p.o. b.i.d. for 12 weeks.  He also recommended  the patient should be on Carafate 1 gm q.i.d., and this is what the  patient will be discharged home on.  This morning, she is able to  tolerate clear liquids without significant pain.   PHYSICAL EXAMINATION:  VITAL SIGNS:  Her vital signs are stable.  [name]  afebrile.  Her CBG this morning is 155.  CHEST:  Clear to auscultation.  HEART:  Heart sounds 1-2 are heard with no murmurs.  No S3 gallops.  No  rubs.  ABDOMEN:  Soft, nontender, no masses.  Bowel sounds present.  EXTREMITIES:  Shows no edema, calf tenderness or swelling.  NEUROLOGIC:  [name] alert, oriented x3 with no focal neurological  deficits.   [name] therefore considered stable for discharge home today.   DISCHARGE DIAGNOSES:  1. Erosive esophagitis.  2. Diabetic ketoacidosis, now resolved.  3. Hypokalemia,  resolved.  4. Acute renal failure, resolved.  5. Metabolic acidosis, resolved.  6. Severe dehydration, resolved.  7. Type 2 diabetes mellitus, moderately controlled.  8. Systemic hypertension, well-controlled.   DISCHARGE MEDICATIONS:  1. Protonix 40 mg p.o. b.i.d. for 12 weeks.  2. Carafate 1 gm p.o. q.i.d.  3. Hydrocodone/APAP 7.5/325 as [facility] by [facility].  4. Lipitor 10 mg daily.  5. Enalapril 20 mg daily.  6. Phenergan 25 mg p.o. q.6 p.r.n.  7. Hydrochlorothiazide 25 mg p.o. daily.  8. Robaxin 500 mg p.o. t.i.d. p.r.n.  9. Celexa 20 mg p.o. daily.  10.Wellbutrin 300 mg p.o. daily.  11.Hydroxyzine 25 mg p.o. q.6 p.r.n.   This discharge plan has been discussed with her and her questions  answered.   CONDITION ON DISCHARGE:  Stable.   DISPOSITION:  Home.   Follow-up will be with me in the office in about 2 weeks.      Fleet Contras, M.D.  Electronically Signed     EA/MEDQ  D:  07/20/2008  T:  07/20/2008  Job:  [id number]

## 2011-02-05 NOTE — Consult Note (Signed)
[name], [name]                ACCOUNT NO.:  [id number]   MEDICAL RECORD NO.:  [id number]          PATIENT TYPE:  INP   LOCATION:  1231                         FACILITY:  [facility]   PHYSICIAN:  Elizabeth Artis. [name], M.D.DATE OF BIRTH:  07-23-1961   DATE OF CONSULTATION:  07/14/2008  DATE OF DISCHARGE:                                 CONSULTATION   CHIEF COMPLAINT:  Altered mental status, hyperglycemia, febrile illness.   HISTORY OF PRESENT ILLNESS:  [name] is a 50 year old woman  admitted to [facility] and placed in the intensive care unit  in a setting of delirium with glucose of 958, which was corrected over a  period of less than 12 hours to normal.   The patient has had ongoing delirium.  I was asked to see her to  determine the etiology of her dysfunction and make recommendations for  further workup and treatment.   The patient presented to [facility] 12 hours before she was  admitted.  She came in with fever and mild delirium.  She was thought to  have a drug overdose, however, drug screen failed to show anything other  than opiates which she takes for chronic pain.  She had a glucose of  300.  She had low grade fever.  She was discharged home.  Twelve hours  later she came in, in much more severe delirium.   CT scan of the brain on July 11, 2008, as well as July 13, 2008,  shows no acute findings, no stroke, no hydrocephalus, no subdural and no  hemorrhage.   Family does not report history of seizures or syncope.  The patient has  not complained of chest pain or shortness of breath, orthopnea or  palpitations.  Currently she is complaining of some abdominal pain but  at that time had no vomiting or diarrhea.  She also had no dysuria, no  cough, hemoptysis, passages of blood her stools.  She had a very  extensive workup on the first evaluation including CT scan of the brain,  CT scan of the abdomen and pelvis; all this was unremarkable.   PAST MEDICAL HISTORY:  Remarkable for  1. Type 2 diabetes mellitus requiring insulin.  Last hemoglobin A1c      January 2009 was 6.6.  2. Systemic hypertension.  3. Dyslipidemia.  4. Depression.  5. Anxiety disorder.  6. Herniated lumbar disk.  7. Possible deep vein thrombosis.   PAST SURGICAL HISTORY:  Lumbar laminectomy with L4-L5 fusion March 2009  by Dr. Alveda Reasons.   MEDICATIONS ON ADMISSION:  1. Lantus insulin 40 units at bedtime.  2. Humalog sliding scale.  3. Actos 30 mg daily.  4. Enalapril 20 mg daily.  5. Celexa 20 mg daily.  6. Hydrochlorothiazide 25 mg daily.  7. Lipitor 10 mg daily.  8. Xanax 0.25 mg twice daily as needed.  9. Wellbutrin XL 300 mg twice daily.  10.Norco 5/225 one every 6 hours as needed.  11.Reglan 10 mg 3 times a day before meals.  12.Prilosec 40 mg daily.  13.Phenergan 25 mg every 6 hours  as needed.   DRUG ALLERGIES:  CELEBREX, response unknown.   REVIEW OF SYSTEMS:  As noted above.   FAMILY HISTORY:  Unknown.   SOCIAL HISTORY:  The patient lives with her family.  Family has not been  present much during this hospitalization.  I cannot say more about that.  The ER note said that she did not use tobacco, alcohol or drugs.   PHYSICAL EXAMINATION:  GENERAL APPEARANCE:  The patient is awake.  She  tells me her name.  She will sometimes start a sentence and not finish  it.  When I ask her if she hurt, she said no; later on she said my  stomach, my stomach.  VITAL SIGNS:  Blood pressure 154/82, resting pulse 94, respirations 19,  temperature 98.5.  HEENT:  Tympanic membranes are normal.  Pharynx I cannot see.  Conjunctivae are not inflamed.  NECK:  Supple.  There are no cranial or cervical bruits.  LUNGS:  Clear.  No dyspnea.  She has a nonproductive cough.  HEART:  No murmurs.  Pulses normal.  ABDOMEN:  Protuberant.  Bowel sounds normal.  Mildly tender.  No  hepatosplenomegaly.  EXTREMITIES:  Well-formed, however, she has tight heel cords.   She has  obvious operations at the ankle which appeared to be some form of ankle  stabilization procedure.  Neurologic:  Mental status is has been described above.  The patient is  in delirium.  She tends to keep her eyes closed.  She will briefly open  them and then close them again.  She would not follow commands other  than to protrude her tongue.  She did not tell me where she was.  She  told my physician's assistant that she was in [place], could not tell  us where exactly she was.  Cranial nerves:  Round reactive pupils.  She  has definite photophobia.  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  Disk margins appeared  sharp.  I could not test visual fields.  Extraocular movements could not  be tested easily.  She has symmetric facial strength and midline tongue.  I could not see in her pharynx.  Motor examination:  The patient moves  all four extremities.  She can keep her arms in the space for a few  seconds at a time before they drift down.  She will not wiggle her  fingers.  She did not grasp.  She definitely withdraws to noxious  stimuli.  I can prop her legs on the bed, bend it at the knee with her  foot on the bed bilaterally.  She withdraws to noxious stimuli in her  legs as well.  Deep tendon reflexes were symmetric and normal.  There  were no crossed adductor.  There is no ankle clonus.  The patient  appears to have bilateral extensor plantar responses though she seems to  have hammertoes and the toes appear to be slightly in extension even at  rest.   IMPRESSION:  Delirium, etiology unknown.  (293.0)  I have some concerns  that this may be related not only to her hyperglycemia but rapid  correction.  The patient's fever is declining rapidly.  I do not think  that she has sepsis or meningitis.  [name] not showing signs of  seizures.  She has no focal features.  It is unlikely the patient has a  structural abnormality of her  brain.  It is unlikely that she  has  meningoencephalitis.   RECOMMENDATIONS:  1. EEG.  2. MRI scan would be helpful, but unless she calms down, she would      have to go under conscious sedation and be transferred to the [place] [place] temporarily for that procedure.      Similarly, I do not think that lumbar puncture is needed at this      time.  If she shows spiking fevers or develops stiff neck or      delivery worsens, then it would be necessary.   I appreciate the opportunity to participate in her care.  I would limit  the number of sedatives that she has and restrain her as needed.  I  suspect that this will clear with time.  I appreciate the opportunity to  participate in her care and will continue to follow along with you.      [name], M.D.  Electronically Signed     WHH/MEDQ  D:  07/14/2008  T:  07/14/2008  Job:  [id number]

## 2011-02-05 NOTE — Op Note (Signed)
NAMEVICI, NOVICK                ACCOUNT NO.:  [id number]   MEDICAL RECORD NO.:  [id number]          PATIENT TYPE:  INP   LOCATION:  1615                         [facility]ACILITY:  [facility]   PHYSICIAN:  Deidre Ala, M.D.    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   POSTOPERATIVE DIAGNOSIS:  DJD (degenerative joint disease) end-stage  left knee.   PROCEDURE:  Left total knee arthroplasty using cemented DePuy components  LCS type with rotating platform cemented MBT revision type stem.   SURGEON:  1. Charlesetta Shanks, M.D.   ASSISTANT:  Phineas Semen, P.A.   ANESTHESIA:  General with femoral nerve block.   CULTURES:  None.   DRAINS:  Two medium Hemovac to self suction.   ESTIMATED BLOOD LOSS:  Minimal.   TOURNIQUET TIME:  1 hour and 23 minutes.   PATHOLOGIC FINDINGS AND HISTORY:  [name] is a rather miserable  person with terrible back pain, end-stage DJD of the knee, failure of  cortisone injections, chronic knee pain who elected to proceed with left  total knee arthroplasty.  At surgery classic findings were noted.  We  fitted her to a 2.5 tibia revision cemented tray, a size medium femur, a  10 mm rotating platform, a 75 x 14 mm fluted stem and an oval dome three  peg all poly patella 32 mm.  In any case, she had full extension with  flexion to 100 degrees, stable to varus-valgus stressing, good patellar  tilt and track with a slight patellar release carried out.   PROCEDURE IN DETAIL:  With adequate anesthesia obtained using LMA  technique and femoral nerve block the patient was placed in a supine  position.  The left lower extremity was prepped from the toes to the  tourniquet in standard fashion.  After standard prepping and draping  Esmarch exsanguination was used.  The tourniquet was let up to 350 mmHg.  We then made a median parapatellar skin incision followed by a median  parapatellar retinacular incision.  The incision was deepened sharply  with a knife and hemostasis was obtained using the Bovie  electrocoagulator.  Dissection was carried down through the retinaculum.  The patella everted, the fat pad excised and then we removed both  cruciates and the menisci.  I then amputated the tibial spine, smoothed  it flat and started drilling with the intermedullary drill and reamed up  to a 14.  The tibial cutting jig 2 degree was put in place and the cut  made.  I cut 2.5 mm and 2 mm more.  I then sized the femur to a medium,  placed intermedullary guide, placed a C clamp with it moved up to  bringing it anterior which gave [place] a 10 mm flexion gap.  Anterior/posterior cuts were made and the 10 fit in flexion.  I then  placed the fourth degree distal femoral cutting jig in place and made  the cut  on the first cut but had to cut 5 mm more to fit 10 in  flexion/extension.  The finishing guide on the femur was then put in  place and those cuts made as well as the drill holes.  I then sized the  femur to a 2.5, reamed the proximal ring then the stem ring then placed  the trial with the 14 x 75 with a keel punch.  I then placed on the  femoral component and articulated the knee through a full range of  motion with full extension and appropriate valgus angle.  I then used  the patellar cutting jig to cut down 8 mm appropriately thinning the  patella to accept an 8 mm 32 all poly button.  The template was used to  drill those holes.  The patella button was then put in place and the  knee articulated through a range of motion with good track with a  lateral release carried out.  All trial components were then removed  while the knee was thoroughly jet lavaged and components came on the  field and were sized.  We mixed cement with the vancomycin and we then  assembled the tibial component and cemented  it, impacted it and removed  excess cement.  We placed the 10 mm rotating platform.  We then cemented  on the femoral component, impacted it, removed excess cement.  We then  placed the patella in place, impacted it and removed excess cement and  held the knee, held it until it cured.  The knee was then articulated  through a range of motion.  When the cement had cured the tourniquet was  let down.  Bleeding points were cauterized.  Hemovac drains were placed  in the medial and lateral gutter and brought out through the  superolateral portal.  Additional jet lavage was carried out.  FloSeal  was placed in the wound.  The wound was then closed in layers with #1  Vicryl on the retinaculum, 0 and 2-0 Vicryl on the subcu and skin  staples.  We did use a running locking over sew of #1 PDS on the  retinaculum.  Hemovacs were hooked up to self suction.  A bulky sterile  compressive dressing was applied with knee immobilizer and the patient  having tolerated the procedure well was awakened, taken to the recovery  room in satisfactory condition to be admitted for routine postoperative  care, CPM and analgesia.      Deidre Ala, M.D.  Electronically Signed     VEP/MEDQ  D:  08/16/2008  T:  08/17/2008  Job:  [id number]   cc:   Fleet Contras, M.D.  Fax: [phone]

## 2011-02-05 NOTE — H&P (Signed)
[name], [name]                ACCOUNT NO.:  [id number]   MEDICAL RECORD NO.:  [id number]          PATIENT TYPE:  INP   LOCATION:  4732                         FACILITY:  [facility]   PHYSICIAN:  Fleet Contras, M.D.    DATE OF BIRTH:  1960/12/15   DATE OF ADMISSION:  01/13/2008  DATE OF DISCHARGE:                              HISTORY & PHYSICAL   PRESENTING COMPLAINTS:  1. Chest pain.  2. Back pain.  3. Leg pains.   HISTORY OF PRESENT ILLNESS:  [name] is a 50 year old Caucasian lady  with past medical history significant for type 2 diabetes mellitus,  systemic hypertension, hyperlipidemia, depression, anxiety and herniated  lumbar disk, status post L4-L5 spondylolisthesis fusion performed on  December 15, 2007.  she presented to Emergency Room at [facility]  with 4 to 5 hours history of chest pain mainly in her left shoulder and  then moving to have central chest, which she says started while she was  home.  She felt nervous and anxious and also had pains in her lower back  and also in her legs.  [name] about a month out from recent surgery on  her lower back performed by Dr. Patrica Duel and she has been ambulating at  home with a walker.  She denied having any nausea, diaphoresis, or  shortness of breath with this pain.  The pain improved once she go to  the hospital and received some Vicodin at the emergency room.  She had  no fevers, no cough, or sputum production, or no hemoptysis.  She denied  having any palpitations, orthopnea, or PND.  She has also had chronic  low back pain and recently worse after her surgery on her lower back but  has been slowly getting better.  The pain radiated to her legs and had  become pain medication postoperatively.  At the emergency room, her  vital signs were stable, however she was in painful distress and could  not ambulate and was therefore admitted to the hospital for further  evaluation and appropriate management.   PAST MEDICAL  HISTORY:  1. Type 2 diabetes mellitus.  Her last hemoglobin A1c on October 22, 2007 was 6.6  2. Systemic hypertension.  3. Hyperlipidemia.  Her last total cholesterol was 130, triglycerides      106, LDL 39, HDL 70 on October 22, 2007.  4. Depression.  5. Anxiety disorder.  6. Herniated lumbar disk.   PAST SURGICAL HISTORY:  Lumbar spine surgeries were last one being the  fusion of L4-L5 on December 14, 2007.   MEDICATION HISTORY:  1. [name] on Lantus insulin 40 units at bedtime.  2. Humalog insulin per sliding scale.  3. Actos 30 mg daily.  4. Enalapril 20 mg daily.  5. Celexa 20 mg daily.  6. Hydrochlorothiazide 25 mg daily.  7. Lipitor 10 mg daily.  8. Xanax 0.25 mg b.i.d.  9. Wellbutrin XL 300 mg one p.o. b.i.d.  10.Norco 5/325 one p.o. q. 6 p.r.n.  11.Reglan 10 mg t.i.d.  12.Prilosec 40 mg daily.  13.Phenergan 25 mg q. 6 p.r.n.   ALLERGIES:  [name] allergic to CELEBREX which gives her stomachache.   SOCIAL HISTORY:  [name] married and lives with her family.  She has one  brother and two daughters who are alive and well.  Both parents are  deceased.  She does not smoke cigarettes or use alcohol or illicit  drugs.   REVIEW OF SYSTEMS:  Essentially as above.   PHYSICAL EXAMINATION:  GENERAL:  [name] lying in the hospital bed in  moderate painful distress in her lower back.  She denies any chest pain  at the movement.  She denied any acute respiratory distress.  VITAL SIGNS:  Blood pressure of 94/55, heart rate of 72 regular,  respiratory rate of 28, temperature 97.4, and O2 sats on room air is  99%.  [name] not pale.  [name] not icteric.  [name] not cyanosed.  She  is well-hydrated, oral mucosa is moist.  NECK:  Supple with no elevated JVD.  No cervical lymphadenopathy.  No  carotid bruit.  CHEST:  Good air entry bilaterally with no rales, rhonchi, or wheezes.  HEART:  Heart sounds S1 and S2 are heard with no murmurs, no S3 or  gallops.  ABDOMEN:  Obese, soft,  nontender.  No masses.  Bowel sounds are present.  EXTREMITIES:  No edema, no calf tenderness, or swelling.  LUMBAR SPINE:  Shows healing wounds over her lower lumbar spine with no  obvious redness or drainage.  CNS:  [name] alert, oriented x3 with no focal neurological deficits.   LABORATORY DATA:  Her white count is 5.4.  Hemoglobin 10.3, hematocrit  30.6, and platelet count of 323.  Sodium is 127, potassium 5.0, chloride  91, bicarbonate of 29, BUN is 9, creatinine 0.84, and glucose of 320.  D-  dimer is 3.87.  Chest x-ray shows no acute infiltrates.  Urinalysis  positive for small leukocyte esterase and 3 to 6 WBC per high-power  field.   ASSESSMENT:  [name] is a 50 year old Caucasian lady with multiple  medical problems presented to the emergency room with acute onset of  chest pain associated with anxiety.  She also has chronic low back pain,  recently was seen by surgery of her lumbar spine.  She was already to  the hospital to rule out acute coronary syndrome and control of her  pain.   ADMISSION DIAGNOSES:  1. Atypical chest pain, rule out myocardial infarction.  2. Chronic low back pain secondary to the lumbar spondylolisthesis      status post L4-L5 fusion a month ago.  3. Hypokalemia repleted.  4. Urinary tract infection likely related to recent catheterization      after lumbar spine surgery.  5. Type 2 diabetes mellitus poorly controlled.   PLAN OF CARE:  She will be on telemetry bed.  Serial CKs and troponin  were performed to rule out acute coronary syndrome with Cipro 500 mg  b.i.d. for 7 days.  CT scan of the chest to rule out acute pulmonary  embolism  or DVT and GI prophylaxis.  CBG is a.c. and nightly and cooperative  sliding scale NovoLog insulin per moderate protocol.  PT consult was  requested to help ambulate. This plan of care has been discussed with  the patient and her husband and their questions answered.      Fleet Contras, M.D.   Electronically Signed     EA/MEDQ  D:  01/13/2008  T:  01/14/2008  Job:  [id number]

## 2011-02-05 NOTE — Procedures (Signed)
DUPLEX DEEP VENOUS EXAM - LOWER EXTREMITY   INDICATION:  Right lower extremity pain with previous swelling.   HISTORY:  Edema:  No, had previous swelling until was put on fluid  pill.  Trauma/Surgery:  No.  Pain:  Right entire lower extremity.  PE:  No.  Previous DVT:  No.  Anticoagulants:  Coumadin.  Other:   DUPLEX EXAM:                CFV   SFV   PopV  PTV    GSV                R  L  R  L  R  L  R   L  R  L  Thrombosis    o  o  o     o     o      o  Spontaneous   +  +  +     +     +      +  Phasic        +  +  +     +     +      +  Augmentation  +  +  +     +     +      +  Compressible  +  +  +     +     +      +  Competent     +  +  +     +     +      +   Legend:  + - yes  o - no  p - partial  D - decreased   IMPRESSION:  No evidence of DVT or SVT in the right lower extremity or  the left common femoral vein.    _____________________________  V. Charlena Cross, MD   AS/MEDQ  D:  06/29/2008  T:  06/29/2008  Job:  [id number]

## 2011-02-05 NOTE — Op Note (Signed)
NAMEMADISON, [name]                ACCOUNT NO.:  [id number]   MEDICAL RECORD NO.:  [id number]          PATIENT TYPE:  INP   LOCATION:  5010                         FACILITY:  [facility]   PHYSICIAN:  Nelda Severe, MD      DATE OF BIRTH:  [birth year]   DATE OF PROCEDURE:  09/22/2007  DATE OF DISCHARGE:                               OPERATIVE REPORT   SURGEON:  Dr. Nelda Severe.   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   PROCEDURE:  Repair of L4-5 pseudoarthrosis (posterolateral L4-5 fusion);  re-insertion pedicle screws at L4-5; harvest iliac crest bone graft  right side posterior.   OPERATIVE NOTE:  The patient was placed under general endotracheal  anesthesia.  Prophylactic antibiotics were administered preoperatively  including Cipro 400 mg for apparent urinary tract infection.  Sequential  compression leggings were placed on both sides.  She was positioned  prone on a Wilson frame.  The upper extremities were positioned to avoid  hyper flexion and abduction of the right shoulder and hyperflexion of  the right elbow.  The right upper extremity was padded with foam from  axilla to hand.  The left upper extremity, where previous shoulder  hemiarthroplasty has been performed has limited range of motion, is  impossible to position the arm in the usual fashion.  Therefore, the arm  was well padded and tucked at the side in adduction with the elbow  extended.   A skin marker was used to outline the previous midline scar in  elliptical fashion.  A mark was also made on the skin over the proposed  incision posterior bone graft harvest on the right side.  A time-out was  held identifying the patient, the preoperative diagnosis and the  intended surgery etc.   After preparation of the skin with DuraPrep and draping in square  fashion  and securing the drape with Ioban, the skin was scored to  outline the elliptical excision of scar.  Subcutaneous tissue was  injected with a mixture of 0.25% plain Marcaine and 1% lidocaine with  epinephrine.  The excision of the ellipse was then carried out using  cutting current.  The dissection was carried down to the intact spinous  processes and the cross-table lateral radiograph taken.  Scar was  divided in the midline and mobilized bilaterally to reveal the pedicle  screws and rods at L4-5 which were grossly loose.  The rod/screw  construct was uncoupled and the screws removed.  The loosest screw of  all was right L4 and it could be just simply pulled out.  The others  were very loose, but had to be turned out.   We then further exposed the transverse processes of L4 and the L5  bilaterally and removed all adherent scar.  There was no vestige of the  previously placed bone graft.   Next, we harvested a right posterior iliac crest graft through a  separate incision.  Dissection was carried  down through the subcutaneous  tissue onto the gluteal fascia.  The gluteal fascia was detached from  the outer lip of the posterior aspect of the right iliac crest.  The  muscle was elevated off the outer table of the ilium.  A deep Taylor  retractor was placed.  The acetabular reamer was used to harvest bone  graft from the cortical outer table of the ilium and gouges were then  used to remove as much cancellous bone as was possible and ultimately,  we had a reasonably moderately large amount of graft available.  We then  irrigated the wound with saline and packed the bony void with Gelfoam.  The gluteal muscle was then reattached to the iliac crest using  interrupted figure-of-eight #1 Vicryl sutures, 3 in number.   Next, we used a high-speed bur to decorticate the transverse processes  on the right at L4-L5 as well as the lateral aspect of the L4 superior  articular process.  Graft was  packed in posterolaterally and then we  placed screws at L4 and L5 on the right side.  An 8.5- mm diameter screw  was used at L4 and depth was measured and the screw length chosen so as  to achieve bicortical fixation at L4 where the screw had been extremely  loose.  The previously placed screw was a 6.5-mm screw.  A 7.5-mm screw  was then placed at L5 on the right.  A 48-mm pre-contoured rod was then  provisionally attached but the couplings not torqued.   Next, we further exposed and then decorticated the transverse processes  on the left at L4-5 as well as the lateral aspect of the superior  articular process of L4.  More graft was packed in posterolaterally.  We  then placed 8.5-mm screws at both levels on the left.  Once again a  screw of length to facilitate bicortical fixation at L4 was placed.   Actually the rod was not attached on the right side (as dictated above)  until we had tested all screws and stimulated them and elicited distal  EMG activity, in each instance the stimulation occurring at a safe  level.  This process was repeated on the left with similar results.  We  then attached a pre-contoured 40-mm rod on the left.  We then torqued  all couplings on both sides.  The rest of the graft was packed in  posterolaterally on both sides.   A cross-table lateral radiograph was taken and showed that we had  penetrated the anterior cortex of L4, having achieved bicortical contact  at L4 which was the most loose level.   A 15-gauge Blake drain was placed in the subfascial layer and brought  out through the skin to the right side.  It was secured with 2-0 nylon  in basket weave fashion.  The thoracolumbar fascia was then closed using  interrupted and continuous #1 Vicryl suture.  A one-eighth inch Hemovac  drain was brought in the central lumbar wound out through the bone graft  harvest wound in a subcutaneous layer and through the skin more  proximally on the right side.  It  was secured with 2-0 nylon in basket  weave fashion.  Subcutaneous layer of both lumbar and bone graft harvest  wound were closed using interrupted 2-0 Vicryl suture and in the central  wound continuous 2-0 Vicryl suture.  Both wounds were closed with  subcuticular 3-0 undyed Vicryl suture in running fashion.  The skin  edges were reinforced with Dermabond.  Nonadherent antibiotic ointment  dressings were applied.   The blood loss estimated at 300-400 mL.  There were no intraoperative  complications.  The patient was stable throughout the procedure.  Sponge  and needle counts were correct.   At the time of dictation the patient has not awakened and therefore no  neurologic report is made here.      Nelda Severe, MD  Electronically Signed     MT/MEDQ  D:  09/22/2007  T:  09/23/2007  Job:  [id number]

## 2011-02-05 NOTE — Op Note (Signed)
[name], [name]                ACCOUNT NO.:  [id number]   MEDICAL RECORD NO.:  [id number]          PATIENT TYPE:  INP   LOCATION:  5010                         FACILITY:  [facility]   PHYSICIAN:  Nelda Severe, MD      DATE OF BIRTH:  06/12/1961   DATE OF PROCEDURE:  09/29/2007  DATE OF DISCHARGE:                               OPERATIVE REPORT   SURGEON:  Nelda Severe, MD   ASSISTANT:  Lianne Cure, PA-C   PREOPERATIVE DIAGNOSIS:  Status post repair of L4-5 pseudoarthrosis  (previous laminectomy at L4-5 and fusion at L4-5 for spinal stenosis and  spondylolisthesis; deep and superficial wound seroma, malposition of  left L4 and L5 pedicle screws, severe right leg pain L4 distribution).   POSTOPERATIVE DIAGNOSIS:  Status post repair of L4-5 pseudoarthrosis  (previous laminectomy at L4-5 and fusion at L4-5 for spinal stenosis and  spondylolisthesis; superficial wound seroma, malposition of left L4 and  L5 pedicle screws, severe right leg pain L4 distribution).   OPERATIVE PROCEDURE:  S1 revision laminectomy, right L4-5 with  decompression of right L4 nerve root; debridement of the wound, lumbar  spine; reinsertion of left L4 and L5 pedicle screws.   CLINICAL NOTE:  This woman is one week postop.  She has had wound  drainage which has persisted throughout that period of time.  The  draining has not been purulent but serosanguineous.  She has not had  fever nor a leukocytosis.  However, the wound has persistently drained  on a daily basis.  She also has had fairly intractable right leg pain in  what I perceived to be the right L4 nerve root distribution.  For this  reason a CT scan was done.  The cause of the right leg pain was not  evident on the CT scan, but it was noted that the left pedicle screws  had not followed the previous pedicle hole tracts and the screws were  malpositioned with most of the threaded portion of screw being lateral  to the vertebral body, left side  only.   For this reason I decided to return the patient to the operating room.  I was concerned that continued drainage might predispose her to a deep  wound infection and presumed that the seroma was deep as well as  superficial, as well as for the purpose of repositioning of the pedicle  screws for better fixation and to make sure that the L4 nerve root has  been satisfactorily decompressed on the right side, notwithstanding that  there did not appear to be any nerve root compression at L4-5 based on  the CT scan, performed without contrast.  This was reviewed with Dr.  Mariam Dollar prior to the surgery.   OPERATIVE PROCEDURE:  The patient was placed under general endotracheal  anesthesia.  A Foley catheter was placed in the bladder.  A gram of  vancomycin was administered intravenously.  Sequential compression  devices were placed on both lower extremities.   The patient was positioned prone on a Jackson frame.  Care was taken to  position the right upper extremities  so as to avoid hyperflexion and  abduction of the shoulder and hyperflexion of the elbow.  Because of her  previously placed hemiarthroplasty on the left side, the shoulder has  very limited range of motion and the left arm was secured at the side of  the patient in the position of adduction and supported on arm board  parallel to the table.  The skin of the lumbar area was first cleaned up  using adhesive remover to remove the residual adhesive tape.  It was  then prepped with DuraPrep.  A square draping was applied and the drapes  secured with Ioban around the perimeter of the operative field.   An elliptical incision was made around the entire excision so as to  debride the wound edges down into the subcutaneous layer.  The specimen  of excised wound was submitted for pathology.  Cultures were taken of  the subcutaneous layer for aerobic and anaerobic culture.   We then removed the sutures in the thoracolumbar fascia and  separated  the thoracolumbar fascia in the midline.  In fact there appeared to be  will have any liquefied seroma in the deep tissues, a small amount of  organized clot being present and nothing suggesting any pus formation,  etc.  The paraspinal muscles reflected bilaterally.   Initially we uncoupled the screws from the rod on the left side and  removed the pedicle screws.   I then proceeded to do a revision laminar foraminotomy of L4-5 on the  right side to address her L4 nerve root pain.  Combination of high-speed  bur, Kerrison rongeurs and curettes were used.  Essentially what I  accomplished was complete decompression of lateral recess medial to the  right L4 pedicle and following the nerve into the neural foramen.  No  foramen further foraminal compression could be identified with a ball-  tipped nerve hook.   We then having soaked the previously removed screws and antibiotic  solution and cleaned them up, reinserted them at L4-L5, making great  effort to direct the tips medially to the maximum extent possible.  This  involved the need to make a hole through the paraspinal muscles on the  left-sided order to medially angulate the L4-5 screw sufficiently.  We  then took AP and lateral radiographs.  The L5 screw tip appeared to be  too lateral in position, especially when I compared the x-ray with the  cross sectional CT scan showing that the deep end of the L5 pedicle  hole, previously created was near the midline.  The screw was then  removed and even greater effort made to direct it medially and reinsert  it.  Another AP x-ray revealed that we had achieved a more medial  angulation and that in all likelihood it is in the previously made screw  tract.   We then attached and torqued the couplings.   While waiting for x-rays on each occasion the wound was filled with  antibiotic solution and allowed to soak with antibiotic solution.   A 15 gauge Blake drain was then placed in  the depths of the wound and  secured with a basket weave nylon suture to the skin on the right side  of the wound.  We then closed the thoracolumbar fascia using interrupted  figure-of-eight #1 Vicryl sutures.  I then placed an eighth inch Hemovac  drain in the subcutaneous layer, also brought it out to the right side  and secured it with a 2-0  nylon suture in basket weave fashion.  The  subcutaneous layer was closed using interrupted 2-0 Vicryl sutures.  The  skin was closed using vertical mattress sutures of 2-0 nylon.  Antibiotic ointment dressing was applied and secured with OpSite.   There were no intraoperative complications.  The sponge and needle  counts were correct.  Blood loss estimated at approximately 200 mL  (surgeon's estimate).   The time of dictation, I have not examined the patient in the recovery  room as of yet.     Nelda Severe, MD  Electronically Signed    MT/MEDQ  D:  09/29/2007  T:  09/30/2007  Job:  [id number]

## 2011-02-05 NOTE — Op Note (Signed)
NAMELILLAR, [name]                ACCOUNT NO.:  [id number]   MEDICAL RECORD NO.:  [id number]          PATIENT TYPE:  INP   LOCATION:  1510                         [facility]   PHYSICIAN:  Shirley Friar, MDDATE OF BIRTH:  [birth year]   DATE OF PROCEDURE:  07/19/2008  DATE OF DISCHARGE:                               OPERATIVE REPORT   PROCEDURE:  Upper endoscopy.   INDICATION:  Abdominal pain, odynophagia.   MEDICATIONS:  Fentanyl 50 mcg IV, Versed 5 mg IV.   FINDINGS:  Endoscope was inserted through the oropharynx and esophagus  was intubated.  The proximal portion of the esophagus was normal in  appearance.  The midportion of the esophagus extending down to  gastroesophageal junction revealed superficial ulceration and edema  consistent with severe erosive esophagitis.  There was an area of  nodularity with some black eschar in the distal most portion of the  esophagus near the GE junction.  No active bleeding was seen.  Endoscope  was advanced down into the stomach which revealed normal body of the  stomach.  Retroflexion was done which revealed normal proximal stomach.  The scope was straightened and advanced down to the distal portion of  the stomach which revealed a small amount of erythema in the distal  portion of the stomach and no ulcer seen.  Endoscope was advanced into  the duodenal bulb which revealed a focal nodular area with edema but no  active ulcer or mass seen.  Endoscope was passed down to second portion  of duodenum which was normal.  Endoscope was withdrawn back to do the  duodenal bulb and one biopsy was taken of this edematous mucosa for  histology.  Endoscope was withdrawn back into the stomach and into the  esophagus and esophageal brushing was done for cytology.   ASSESSMENT:  1. Severe erosive esophagitis, status post esophageal brushing.  2. Slightly edematous duodenal bulb status post biopsy.  3. No ulcer or mass noted.   PLAN:  1.  Follow-up on path.  2. IV PPI therapy q.12 hours x24 hours then p.o. b.i.d. x 12 weeks.  3. Carafate q.i.d.  4. Avoid NSAIDs.      Shirley Friar, MD  Electronically Signed     VCS/MEDQ  D:  07/19/2008  T:  07/19/2008  Job:  [id number]   cc:   Fleet Contras, M.D.  Fax: [phone]

## 2011-02-05 NOTE — Discharge Summary (Signed)
NAMETANGIA, PINARD                ACCOUNT NO.:  [id number]   MEDICAL RECORD NO.:  [id number]          PATIENT TYPE:  INP   LOCATION:  5502                         FACILITY:  [facility]   PHYSICIAN:  Fleet Contras, M.D.    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 Alveda Reasons.  The patient has been admitted twice since  that time with recurrent severe pain involving the lower back and  radiating down to the right leg.  This time she came in complaining of  episode of dizzy spell without any syncope.  She had no seizures.  No  chest pain, palpitations, orthopnea, or PND.  In the emergency room, she  received intravenous pain medication with some relief of back and leg  pain, but initial lab work showed an elevated troponin at 0.11 and she  was therefore admitted to the hospital to rule out for acute coronary  syndrome.  She apparently was also evaluated about a week prior to this  admission with this right leg pain and was thought to have developed  deep venous thrombosis.  She was started on subcu Lovenox as well as  p.o. Coumadin by orthopedist.  Pending ultrasound scan and CT of the  abdomen and pelvis to rule out for deep venous thromboses of the iliac  vessels.  This turned out to be negative for deep venous thromboses on  Feb 19, 2008.  She, however, has continued to take these anticoagulants  at home and I was called by home health nurse who was performing INR  checks and I informed her I was not aware of this therapy that she was  on and she therefore came to the emergency room for further  evaluation.  She has had another venous Doppler study done of her right leg and again  showed no deep venous thrombosis.  She will therefore be discontinued  from the anticoagulation therapy pending her followup with Dr. Alveda Reasons on  March 02, 2008 in the office.   HOSPITAL COURSE:  On admission, the patient was placed on telemetry bed.  She received IV fluids with half-normal saline at 75 mL an hour.  She  received intravenous morphine 1-2 mg q.4 h. as well as Percocet 5/325  one p.o. q. 6 p.r.n.  CBGs were monitored a.c. and h.s., and covered  with sliding scale NovoLog insulin.  Her home medications were  continued.  Serial CKs and troponin were performed x3 so far are all  negative.  Today, she is feeling some better with less pain or swelling  involving the right leg.  [name] ambulant with a walker.  Her vital  signs are stable.  CBG this morning is 180 and chest is clear to  auscultation.  Abdomen is benign.  Right leg shows no swelling.  No  focal areas of tenderness or deformity.  [name] alert and oriented x3  with no focal neurological deficits.  Laboratory data this morning shows  a white count of 5.6, hemoglobin 12, hematocrit 35.4, platelet count of  334.  Sodium is 133, potassium is 4.1, chloride is 98, carbonate is 28,  BUN is 9, creatinine is 0.74, and glucose is 211.  Serial troponin x3 so  far have been negative.  [name] therefore considered stable for  discharge home.   DISCHARGE DIAGNOSES:  1. Chronic low-back pain with right sciatica status post L4-L5 fusion      about 3 months ago.  2. Chronic right leg pain.  There is no evidence of deep venous      thrombosis and she will be discontinued from anticoagulation      therapy.  3. Type 2 diabetes mellitus, moderately controlled.  4. Systemic hypertension, well controlled.   DISCHARGE MEDICATIONS:  1. Lantus insulin 40 units at bedtime.  2. Humalog insulin per sliding scale.  3. Actos 30 mg daily.  4. Enalapril 20 mg  daily.  5. Celexa 20 mg daily.  6. Hydrochlorothiazide 25 mg daily.  7. Lipitor 10 mg daily.  8. Xanax 0.25 mg b.i.d.  9. Wellbutrin XL 300 mg b.i.d.  10.Norco 5/325 one p.o. q. 6 p.r.n.  11.Reglan 10 mg p.o. t.i.d.  12.Prilosec 40 mg daily.  13.Phenergan 25 mg q. 6 p.r.n.   Follow-up will be with me in about 2 weeks and with Dr. Alveda Reasons as  previously arranged on March 02, 2008 in his office.  This plan of care  has been discussed with her and her questions answered.      Fleet Contras, M.D.  Electronically Signed     EA/MEDQ  D:  03/01/2008  T:  03/01/2008  Job:  [id number]

## 2011-02-05 NOTE — Discharge Summary (Signed)
[name], [name]                ACCOUNT NO.:  [id number]   MEDICAL RECORD NO.:  [id number]          PATIENT TYPE:  INP   LOCATION:  5023                         FACILITY:  [facility]   PHYSICIAN:  Lianne Cure, P.A.  DATE OF BIRTH:  Feb 24, 1961   DATE OF ADMISSION:  12/15/2007  DATE OF DISCHARGE:                               DISCHARGE SUMMARY   PREOPERATIVE ADMISSION DIAGNOSES:  Status post lumbar effusion L4- L5  spondylolisthesis, failed fixation of hardware screws.   On December 15, 2007, [name] was brought to the [facility]  for the purpose of removal of  hardware failure fixation of screws.  Postoperatively, she was neurovascularly intact.  She had minimal blood  loss.  Her surgery was postponed initially secondary to high glucose.  She was put on a Glucommander preoperatively.  Her CBG at surgery time  167.  Postop at 1520, CBG was 109.  On postop day 1, she has complained  of right leg pain down the posterior leg any time she moved.  She was  alert and oriented.  Postop day 1, she did have some extreme drowsiness,  would fall asleep during conversation.  We decreased her    DICTATION ENDED AT THIS POINT.      Lianne Cure, P.A.     MC/MEDQ  D:  12/22/2007  T:  12/22/2007  Job:  [id number]

## 2011-02-05 NOTE — Consult Note (Signed)
NAMEJASHIYA, [name]                ACCOUNT NO.:  [id number]   MEDICAL RECORD NO.:  [id number]          PATIENT TYPE:  INP   LOCATION:  1510                         FACILITY:  [facility]   PHYSICIAN:  Graylin Shiver, M.D.   DATE OF BIRTH:  07-31-1961   DATE OF CONSULTATION:  07/18/2008  DATE OF DISCHARGE:                                 CONSULTATION   We were asked to see [name] today in consultation for postprandial  pain by Dr. Fleet Contras.   HISTORY OF PRESENT ILLNESS:  This is a 50 year old female who was  admitted with altered mental status on July 12, 2008.  She also has  had a glucose of over 900.  The patient reports severe pain after eating  in her epigastrium.  She states that this has been going on since before  the first of the year.  She denies vomiting.  She tells me that her pain  starts immediately after eating and is severe, but if she just waits and  does not eat anymore, the pain will pass.  Sucralfate helps the pain.  Reglan and Protonix did not.  The patient takes ibuprofen every 6 hours  for back pain from previous surgeries.  She denies any dark stools or  change in her bowel habits.  She refuses to take pills now secondary to  her pain.   PAST MEDICAL HISTORY:  Significant for:  1. Type 2 diabetes.  2. Hypertension.  3. Dyslipidemia.  4. Depression.  5. Anxiety disorder.  6. Herniated lumbar disk.  [name] status post surgery in March 2009.  7. There is a question of history of a DVT.   CURRENT MEDICATIONS:  Current medications prior to this admission  include:  1. Hydrocodone/acetaminophen.  2. Lipitor.  3. Enalapril.  4. Phenergan.  5. Hydrochlorothiazide.  6. Coumadin.  7. Methocarbamol/aspirin.  8. Celexa.  9. Wellbutrin.  10.Reglan.  11.Prilosec.  12.Hydroxyzine.   ALLERGIES:  She has an allergy to CELEBREX.   SOCIAL HISTORY:  Her social history is significant for crack cocaine use  x 17 years.  She quit 4 years ago.  She smoked her  crack cocaine.  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   FAMILY HISTORY:  Significant for a mother who had stomach ulcers.   REVIEW OF SYSTEMS:  As per HPI and includes back pain.   PHYSICAL EXAMINATION:  GENERAL:  [name] alert and oriented and in no  apparent distress.  VITAL SIGNS:  Her temperature is 98, pulse 73, respirations 16, blood  pressure is 148/84.  HEART:  A regular rate and rhythm.  LUNGS:  Clear to auscultation.  ABDOMEN:  Soft, nontender, nondistended with good bowel sounds.   LABS:  Her BMET is within normal limits other than a glucose of 144.  Her LFTs on October 24 showed a mildly elevated bilirubin of 1.6.  Her  indirect is greater than her direct.  On October 24, her hemoglobin was  11.5, hematocrit 34.3, platelets 219,000, white  count 6.   RADIOLOGICAL EXAM:  She had an upper GI series today that showed she had  normal swallowing.  No esophageal obstruction.  No ulcers.  She did have  prominent folds in the first and second duodenum.  Abdominal ultrasound  done on the 24th showed no acute finding within normal gallbladder, and  on October 20 she had a CT scan of her abdomen and pelvis that showed a  normal gallbladder and small umbilical hernia that contained fat but no  bowel.   ASSESSMENT:  Dr. Herbert Moors has seen and examined the patient,  collected history and reviewed the chart.  His impression is that this  is a 50 year old female with several months of postprandial pain who has  also been using heavy ibuprofen and has a long-term history of crack  cocaine smoking, albeit 4 years ago that she quit.   PLAN:  1. Upper endoscopy in the morning.  2. Continue proton pump inhibitor therapy.  3. Will discontinue nonsteroidal antiinflammatory drugs.  4. Discontinue Reglan.  5. Plan to hold her heparin at 3:00 a.m. this morning for an endoscopy      at approximately 9:30 a.m. by Dr. Bosie Clos.   Thanks very much for      this consultation.      Stephani Police, PA    ______________________________  Graylin Shiver, M.D.    MLY/MEDQ  D:  07/18/2008  T:  07/18/2008  Job:  [id number]   cc:   Graylin Shiver, M.D.  Fax: [phone]   Fleet Contras, M.D.  Fax: [phone]

## 2011-02-05 NOTE — Op Note (Signed)
[name], [name]                ACCOUNT NO.:  [id number]   MEDICAL RECORD NO.:  [id number]          PATIENT TYPE:  INP   LOCATION:  5023                         FACILITY:  [facility]   PHYSICIAN:  Nelda Severe, MD      DATE OF BIRTH:  [birth year]   DATE OF PROCEDURE:  12/15/2007  DATE OF DISCHARGE:                               OPERATIVE REPORT   SURGEON:  Dr. Nelda Severe.   ASSISTANT:  Lianne Cure, PA-C   PREOPERATIVE DIAGNOSIS:  Status post lumbar fusion for L4-5  spondylolisthesis, failed fixation with screw dislodgement.   POSTOPERATIVE DIAGNOSIS:  Status post lumbar fusion for L4-5  spondylolisthesis, failed fixation with screw dislodgement.   OPERATIVE PROCEDURE:  Removal of nonsegmental fixation (screws  bilaterally at L4 and L5); augmentation of posterolateral fusion  bilaterally L4-5 (L4-5 fusion) with beta tricalcium phosphate and  infuse.   OPERATIVE NOTE:  The patient was placed under general endotracheal  anesthesia.  Foley catheter was placed in the bladder.  Sequential  compression devices were placed on both lower extremities.  Prophylactic  intravenous antibiotics had been administered.   She was positioned prone on a [place] frame.  Care was taken to position  the left upper extremity so as to avoid tension on the shoulder which  had exposed which had limited external rotation.  The upper extremity on  the left side was supported on an arm board and padded with foam from  axilla to hand.  The right shoulder was positioned the normal way, being  careful to avoid hyperflexion or abduction of the shoulder and being  careful to avoid hyperflexion of the elbow.  The extremity was padded  with foam from axilla to hand.  Hyperflexion of the elbows was also  avoided on the left side.  The thighs, knees and shins were supported  with pillows.   The previous midline scar was outlined the skin marker.  The lumbar area  was prepared using DuraPrep and the drapes  applied in rectangular  fashion and secured with Ioban.  The dermis was incised around the  ellipse of the scar and the subcutaneous tissue injected a mixture of  0.25% plain Marcaine with 1% lidocaine with epinephrine.  Then  dissection was carried down into the subcutaneous layer using cutting  current and the ellipse removed.  We then split the midline fascia and  scar and mobilized muscle and scar bilaterally until we got to the  spinous process the most distal vertebra.  Cross-table lateral  radiograph was taken which gave [place] an idea of how far above the fixation  we were.  We then mobilized the scar and paraspinal muscle laterally  until the screw heads were identified bilaterally.  The set screws were  removed from the couplings on the left side and the rod removed.  The  screws were then backed out.  I then removed scar tissue down to the  intertransverse area.  The previously placed cancellous graft could be  identified, as yet unincorporated.  I then removed more scar from  lateral aspect of the superior articular process  of L4 on the left side  and of L5 on the left side.  What remained of the transverse processes  was also denuded of scar.   I then moved to the right side and then removed the screws which had  backed out considerably.  Similarly I removed scar down to the  cancellous graft which had been previously placed and as yet has not  incorporated.  I denuded the bone on the lateral aspect of the superior  articular process of L4 and L5 and transverse process of scar tissue.   And we then aspirated 15 mL of bone marrow from the left posterior iliac  crest and mixed with 15 mL of beta tricalcium phosphate porous morsels.  A medium pack of Infuse was prepared.   I then created two small sandwiches placing the filler of beta  tricalcium phosphate soaked in bone marrow between two pieces of Infuse  soaked collagen sponge.  Once the sandwich was placed posterolaterally   on my right side and one on the left side.   The remaining beta tricalcium phosphate was packed in superficially on  either side, that is superficial to the sandwich.   Next a 15 gauge Blake drain was placed subfascially and brought out  through the skin to the right side.  It was secured with 2-0 nylon  suture.  We then closed the thoracolumbar fascia using continuous #1  Vicryl suture.  A one-eighth inch Hemovac drain was placed in the  subcutaneous layer and brought out through the skin to the right side  and secured with 2-0 nylon suture.  The subcutaneous layer was then  closed using interrupted 2-0 Vicryl suture.  Skin was closed then using  a subcuticular running 3-0 undyed Vicryl suture.  The skin edges were  reinforced with Steri-Strips.  Antibiotic ointment dressing was applied  and secured with OpSite.   Blood loss estimated to approximately 200 mL.  There were no  intraoperative complications.  The patient was stable throughout the  procedure.  Sponge, needle counts were correct.   At the time of dictation the patient has not awakened so no examination  in recovery room is reported here.      Nelda Severe, MD  Electronically Signed     MT/MEDQ  D:  12/15/2007  T:  12/16/2007  Job:  [id number]

## 2011-02-05 NOTE — H&P (Signed)
[name], [name]                ACCOUNT NO.:  [id number]   MEDICAL RECORD NO.:  [id number]          PATIENT TYPE:  INP   LOCATION:  1231                         [facility]AC[facility]:  [facility]   PHYSICIAN:  Fleet Contras, M.D.    DATE OF BIRTH:  10-23-1960   DATE OF ADMISSION:  07/12/2008  DATE OF DISCHARGE:                              HISTORY & PHYSICAL   PRESENTING COMPLAINT:  Mental status change.   HISTORY OF PRESENT ILLNESS:  [name] is a 50 year old lady with  multiple medical problems including systemic hypertension, type 2  diabetes mellitus, dyslipidemia, depression, anxiety, herniated lumbar  disk status post L4-L5 spondylolisthesis fusion.  She presented to the  emergency room brought by EMS and her family members with 2 days history  of progressively worsening mental status change who was apparently seen  at the emergency room yesterday for same complaints.  At that time  workup including blood work, CT scan of the head, urinalysis, chest x-  ray were essentially nonrevealing and she was discharged home.  She  returned with progressively worsening symptoms.  There was no report of  any seizures, syncope, chest pain, shortness of breath, orthopnea or  palpitations.  There was no report of any abdominal pain or vomiting or  diarrhea.  No urinary symptoms.  She had not had any recent cold or  cough symptoms, no hemoptysis, no passage of blood in her stools.  Essentially the cause of her symptoms was unknown.  However, an  evaluation of her laboratory data glucose was over 900 with large  ketones in her blood and very low bicarbonates and elevated BUN and  creatinine.  [name] not thought to be in diabetic ketoacidosis, so she  is being admitted to the intensive care unit for close monitoring and  further evaluation.   PAST MEDICAL HISTORY:  1. Type 2 diabetes mellitus requiring insulin therapy.  Her last      hemoglobin A1c January of 2009 was 6.6.  2. Systemic hypertension.  3.  Dyslipidemia.  4. Depression.  5. Anxiety disorder.  6. Herniated lumbar disk status post L4-L5 fusion in March 2009.  7. 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      This has not been completely confirmed.  I am not certain if she is      still on anticoagulation therapy.   PAST SURGICAL HISTORY:  Lumbar spine L4-L5 fusion in March 2009 by Dr.  Alveda Reasons.   MEDICATION HISTORY:  According to her last discharge summary in June  2009, she was on:  1. Lantus insulin 40 units at bedtime.  2. Humalog insulin per sliding scale.  3. Actos 30 mg daily.  4. Enalapril 20 mg daily.  5. Celexa 20 mg daily.  6. Hydrochlorothiazide 25 mg p.o. daily.  7. Lipitor 10 mg daily.  8. Xanax 0.25 mg b.i.d. p.r.n.  9. Wellbutrin XL 300 mg b.i.d.  10.Norco 5/325 1 p.o. every 6 p.r.n.  11.Reglan 10 mg p.o. t.i.d. before meals.  12.Prilosec 40 mg p.o. daily.  13.Phenergan 25 mg every 6 p.r.n.  ALLERGIES:  She has allergies to CELEBREX.   FAMILY/SOCIAL HISTORY:  [name] married and lives with her family but  there is nobody available at this point for further information.  She  does not smoke cigarettes or use alcohol or illicit drugs.   REVIEW OF SYSTEMS:  Essentially as above.   PHYSICAL EXAMINATION:  [name] lying comfortably in the examination bed,  not in acute respiratory or painful distress.  [name] not pale.  [name]  not icteric.  [name] not cyanosed.  [name] markedly dehydrated with dry  oral and tongue mucosa.  Her pupils are equal, round, reactive to light  and condition.  NECK:  Supple with no elevated JVD.  No cervical lymphadenopathy.  No  carotid bruit.  CHEST:  Shows good air entry bilaterally with scattered expiratory  rhonchi.  No wheezes and no rales.  Heart sounds 1 and 2 are heard with  no murmurs, no S3 gallops, no rubs.  ABDOMEN:  Obese, soft nontender.  There are multiple midline scars.  There are no palpable masses.  Bowel sounds are present.   EXTREMITIES:  Shows no edema, no calf tenderness or swelling.  There are  deformities of the feet with previous left ankle surgeries.  CNS:  [name] barely alert in response to speech, painful stimuli with  eye-opening.  She has no focal neurological deficits.   LABORATORY DATA:  Urinalysis is negative for nitrite or leukocyte  esterase with 0-2 WBCs per high-power field, 0-2 RBCs per high-power  field, sodium is 129, potassium 6.1, chloride 89, bicarbonate of 6,  glucose is 978, BUN 33, creatinine 2.11, calcium is 8.4, total protein  6.1, albumin 3.6, AST is 16, ALT 9, alkaline phosphatase 144, bilirubin  1.8, white blood cell count is 23.5, hemoglobin 11.8, hematocrit 36.2,  platelet count is 403.  Acetone in blood is large.  CT scan of the head  is reported as no acute intracranial abnormalities.  CT scan of the  abdomen shows no acute abnormality with arthrosclerosis.  The pelvis  shows postoperative changes of the pelvis and lower lumbar spine.  Urine  drug screen is positive for opiates which is prescribed.  Her later  vital signs at 6:30 p.m. shows a blood pressure of 129/88, heart rate of  102, respiratory rate of 20, temperature was 97.8.   ASSESSMENT:  This is a 50 year old Caucasian lady with multiple medical  problems present to the hospital emergency room at [facility]  with mental status change.  This is most likely due to diabetic  ketoacidosis with hyperosmolar state and anion gap metabolic acidosis.   ADMISSION DIAGNOSES:  1. Severe hyperglycemia.  2. Diabetic ketoacidosis.  3. Metabolic acidosis.  4. Acute renal failure.  5. Hyperglycemia.  6. Severe dehydration.   PLAN OF CARE:  She will be admitted to intensive care unit.  The PICC  line will be placed for poor peripheral access.  Her antihypertensive  medication will be put on hold.  She will be started on glucose  stabilizer protocol.  She will receive 3 L of IV fluids followed by  normal saline at  250 mL an hour for 4 hours and then half-normal saline  with 20 of potassium at 125 mL an hour and strict input and output to be  monitored.  CBGs to be checked according  to the glucose stabilizer protocol.  Electrolytes to be checked every 6  hours x2.  She will be started on  intravenous Zosyn 2.25 g every 8 hours  empirically for possible infection.  Her condition will be monitored  closely and treatment adjusted according to her response to the above  therapeutic plan.      Fleet Contras, M.D.  Electronically Signed     EA/MEDQ  D:  07/12/2008  T:  07/13/2008  Job:  [id number]

## 2011-02-05 NOTE — Procedures (Signed)
EEG NUMBER:  [id number].   REQUESTING PHYSICIAN:  Deanna Artis. Sharene Skeans, MD   CLINICAL HISTORY:  A 50 year old woman admitted July 12, 2008, with  diabetic ketoacidosis and altered mental status.  EEG is for evaluation.  The patient is described as awake and asleep.  This portable EEG is done  without photic stimulation done at the bedside without photic  stimulation and hyperventilation.   DESCRIPTION:  No dominant waking rhythm is discerned, although on brief  occasions or towards the end of the records, a 7-8 Hz alpha rhythm  appears to be few seconds at a time, perhaps during brief affects of  wakefulness.  Mostly, however, record consists of diffuse 5-6 Hz theta  rhythms which appear diffusely without abnormal asymmetry, sometimes  superimposed by 2-3 Hz higher amplitude delta slowing in the central and  temporal areas.  No focal slowing is noted and no epileptiform  discharges are seen.  Photic stimulation and hyperventilation are not  performed.  Single channel devoted EKG revealed sinus rhythm throughout  with a rate of approximately 84 beats per minute.   CONCLUSIONS:  Abnormal study due to the presence of moderately severe  generalized slowing and disorganization in the background rhythms.  Findings suggestive of diffuse widespread cerebral dysfunction and  consistent with encephalopathic and/or demented state.  No focal slowing  is noted and no epileptiform discharges are seen.      Michael L. Thad Ranger, M.D.  Electronically Signed     [id number]  D:  07/15/2008 18:28:58  T:  07/16/2008 03:53:49  Job #:  [id number]

## 2011-02-05 NOTE — Discharge Summary (Signed)
NAMEESTELLA, MALATESTA                ACCOUNT NO.:  [id number]   MEDICAL RECORD NO.:  [id number]          PATIENT TYPE:  INP   LOCATION:  5010                         FACILITY:  [facility]   PHYSICIAN:  Nelda Severe, MD      DATE OF BIRTH:  [birth year]   DATE OF ADMISSION:  09/22/2007  DATE OF DISCHARGE:                               DISCHARGE SUMMARY   On September 22, 2007, Ms. Kayes was admitted to [facility] for  posterior lumbar surgery.  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   Postoperatively, the patient was stable, admitted to room 5010.  Minimal  blood loss.  Neurovascular and motor intact.  On postop day #1, in  stable condition.   She has had to have her IV restarted from the IV team, pulled out in the  middle of the night.  States her back feels fine.  She does have some  right leg pain.  Blood glucose on a sliding scale was reported at 303  this morning.  Hemoglobin stable at 9.5.  Care management helping with  discharge planning.  Physical therapy helping with mobility.   On postop day #2, the Foley and the PCA were DC'd.  Breakthrough pain  medicine was written for.  T max was 99.1.  Vital signs were stable.  Drain output was 150 cc total.  Urinary output was 1000+ cc.  She was  eating and drinking at this point.  Hemoglobin 8.6.  She continued to be  afebrile.  She continued to increase her mobility.  Continued to  complain of right leg pain.  She had serosanguineous  drainage daily on  the dressing that was changed.  I also ordered a nystatin powder for her  skin fold abdominal area secondary to probable yeast and skin  irritation.   With continued drainage and continued leg pain, Dr. Alveda Reasons decided to  take her back to the operating room.  On September 29, 2007, we took her  back to the operating room secondary to superficial wound seroma and  malposition of left L4 and left L5 pedicle  screws with severe right leg  pain in the L4 known distribution.  Patient tolerated the second surgery  well.  She had minimal blood loss.  Neurovascular and motor intact.  Complained of postoperative pain but right leg pain was reported as  being better.  A new drain was placed, and interrupted sutures were  placed postoperatively.   Patient was readmitted to her room, 5010.  Patient complained of chest  pain radiating down the left arm.  EKG was done, which was normal.  Hemoglobin was critical at 7.8.  She was transfused 2 units of packed  red blood cells.  Patient tolerated this well.  She had some  hypoglycemic events at CBG 52 and CBG at 51 that were documented on  October 02, 2007.  Still complains of some right upper thigh pain but it  is not as severe as it was prior to the second surgical procedure of the  knee, now due to  blood loss following surgery.  We did take AP lateral x-  rays and order a brace.  She was continued to be afebrile, increased her  mobility.   Her LXO brace arrived.  [name] wearing it for out of bed activities.  The wound has no active drainage, still sanguineous on the dressing.   DISPOSITION:  Stable.   DIET:  Low carbohydrate.   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  Previous  laminectomy, L4-5 with spinal stenosis, spondylolisthesis, deep and  superficial wound seroma, malpositioning of L4-5.  Left screws  repositioned.   PLAN:  For activity, we are going to get a nurse through home health to  do wound checks and dressing changes.  She will come back to our office  in 10 days to have the sutures removed.  She also needs a bedside toilet  for home use.  She may shower, increase walking activities.  No bending,  stooping, lifting.  [name] going to follow up in about 10 days.  Sending  her home with Norco 10/325, count of 120, 1-2 q.4h. p.r.n. pain and  Robaxin 500 1-2 q.6h., count of 100 with refill.      Lianne Cure,  P.A.      Nelda Severe, MD  Electronically Signed    MC/MEDQ  D:  10/05/2007  T:  10/05/2007  Job:  [id number]

## 2011-02-05 NOTE — Discharge Summary (Signed)
NAMEADRIELLE, [name]                ACCOUNT NO.:  [id number]   MEDICAL RECORD NO.:  [id number]          PATIENT TYPE:  INP   LOCATION:  5023                         FACILITY:  [facility]   PHYSICIAN:  Lianne Cure, P.A.  DATE OF BIRTH:  02-17-1961   DATE OF ADMISSION:  12/15/2007  DATE OF DISCHARGE:                               DISCHARGE SUMMARY   ADMITTING DIAGNOSIS:  Status post fusion L4-L5 spondylolisthesis failed  fixations with screws dislodgment.   Postoperatively, the patient was grossly neurovascularly motor intact.  Preoperatively, we did had to put her on a Glucomed secondary to high  glucose of over 400.  At 1520,  the CBG was reported to be 109.  Postoperatively, the patient had chief complaint of right leg pain, with  scream out in pain.  She was placed on a Dilaudid PCA.  She was  extremely drowsy postop day #1.  She was afebrile and stable, otherwise.  We did place her on Neurontin 600 mg 2 in the a.m., 1 at noon, and 2 in  the p.m.  Her IV came out prematurely secondary to infiltration.  We  gave her IM injection of morphine periodically to help with pain control  as well as Norco 7.5.  She continued to have drowsiness.  We reduced her  Vicodin dose to further 500 one q.4.  We continued with Robaxin 500 to  try to help with muscle spasms and pain.  She still needed periodic IM  injections to help with pain control.  She maintained stable hemoglobin.  Glucose fluctuated, she was treated with a sliding scale.  Drains were  discontinued on December 17, 2007.  Incision appeared to be healing well.  No active drainage.  No erythema.  She continued to progress with her  mobility with the assistance of physical therapy.  The patient had  decreased leg pain over the following days today on December 22, 2007.  The  patient still has chief complaint of right leg pain down the posterior  aspect, but only happens when she rises, when she gets moving with a  rolling walker throughout her  room.  The pain does subsides some.  We  have decided to discharge her home today.   DISCHARGE DIAGNOSES:  Status post lumbar fusion L4-L5 spondylolisthesis,  failed fixation screw removal as well as rods, posterior spine.  We are  sending her home with Neurontin 600 mg 2 in the a.m., 1 at noon, and 2  in the evening. and send her to home with Norco 7.5/325 one to two q.4  h. p.r.n. for pain.  Robaxin 500 mg 1 every 6 as needed for muscles  spasms.  She does have equipment at home that includes rolling walker,  bedside toilet.  She has family to help care for her.  [name] going to  follow up in her office in 2 weeks.  She will call to make an  appointment.  No dressing is necessary.  She will continue on all her  preoperative home medications.   DISPOSITION:  [name] stable in her disposition.   DIET:  Regular/diabetic diet.      Lianne Cure, P.A.     MC/MEDQ  D:  12/22/2007  T:  12/22/2007  Job:  [id number]
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
## 2011-02-08 NOTE — Op Note (Signed)
[place]  Patient:    [name], [name]                         MRN: [id number] Proc. Date: 10/22/00 Adm. Date:  [id number] Attending:  Ernesto Rutherford                           Operative Report  PREOPERATIVE DIAGNOSES: 1. Dense vitreous hemorrhage, left eye. 2. 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  PROCEDURES:  Posterior vitrectomy with Endolaser panretinal photocoagulation, left eye.  SURGEON:  Ernesto Rutherford, M.D.  ANESTHESIA:  General endotracheal anesthesia.  DESCRIPTION OF PROCEDURE:  The patient is a 50 year old woman with profound vision loss and impairment of activities of daily living and the ability to work because of a profound vision loss in the left eye.  This is an attempt to induce quiescence of diabetic retinopathy so as to salvage and restore as much visual acuity functioning as possible in this left eye.  Patient understands the risks of anesthesia, including the rare of occurrence of death, as well as to the eye, including hemorrhage, infection, scarring, need for further surgery, no change in vision, loss of vision, and progressive disease despite intervention.  An appropriate signed consent was obtained.  DESCRIPTION OF PROCEDURE:  Patient was taken to the operating room.  In the operating room, general endotracheal anesthesia instituted without difficulty. Left periocular region sterilely prepped and draped in the usual ophthalmic fashion.  A lid speculum applied.  Conjunctival peritomy fashioned temporally and superonasally.  A 4 mm infusion was then secured 4 mm posterior to the limbus in the inferotemporal quadrant.  Placement in the vitreous cavity verified visually.  Superior sclerotomies were then fashioned while the microscope placed in position with BIOM attached.  Core vitrectomy was then begun.  Iatrogenic posterior vitreous detachment  was necessary, and neovascularization elsewhere along the superotemporal arcade was identified as the site of probable recurrent dense vitreous hemorrhages.  This attachment was transected and all traction released, and the remainder of the hyaloid was removed in modified en bloc technique, trimming of vitreous skirt 360 degrees. Hemostasis was spontaneous.  Preretinal vitreous hemorrhage was aspirated.  At this time, instruments removed from the eye.  Endolaser photocoagulation was then placed peripherally in fill-in pattern to complete the panretinal photocoagulation.  At this time, superior sclerotomies were then closed with 7-0 Vicryl suture. The infusion removed and closed with 7-0 Vicryl suture.  Conjunctiva closed with 7-0 Vicryl suture.  Subconjunctival injections of antibiotic and steroid applied.  The patient tolerated the procedure well without complications. DD:  10/22/00 TD:  10/23/00 Job: [id number]

## 2011-02-08 NOTE — Consult Note (Signed)
[name], [name]                ACCOUNT NO.:  [id number]   MEDICAL RECORD NO.:  [id number]          PATIENT TYPE:  INP   LOCATION:  4729                         FACILITY:  [facility]   PHYSICIAN:  Everardo Beals. Juanda Chance, MD, FACCDATE OF BIRTH:  [birth year]   DATE OF CONSULTATION:  12/05/2006  DATE OF DISCHARGE:                                 CONSULTATION   PATIENT PROFILE:  A 50 year old Caucasian female with multiple risk  factors for coronary disease including hypertension, hyperlipidemia and  diabetes who presented with epigastric and substernal discomfort, for  which we are asked to consult.   PROBLEM LIST:  1. Chest/epigastric pain.      a.     Abnormal D. dimer  0.76.      b.     November 05, 2006 chest CT:  No PE dissection or aneurysm.       Regular greater than left apical microcytic changes with scattered       alveolar consolidation.  Atypical infection cannot be excluded.  2. Hypertension.  3. Hyperlipidemia.  4. Type 2 diabetes mellitus.  5. Depression.  6. Anxiety.  7. Left shoulder hemiarthroplasty October 2007.  8. Right herniorrhaphy.  9. Tonsillectomy.  10.Surgery of the spine.  11.Bilateral multiple foot surgeries.  12.Herniated disk in the lower back.  13.Osteoarthritis.  14.Remote polysubstance abuse including 45 pack-year history of      tobacco abuse EtOH abuse and cocaine abuse.  Quit cocaine and      alcohol 10 years ago.  Quit tobacco 10 months ago.   HISTORY OF PRESENT ILLNESS:  A 50 year old Caucasian female with the  above problem list.  In November 2007, she had an episode of chest pain  which resolved on its own at home.  She presented to primary care the  next day and an ECG was performed and per patient reports she was told  that she may have had an MI and that she did not require any additional  treatment.  She was in her usual state of health until 7:00 p.m. on  March 12 when she developed epigastric and substernal chest discomfort  and stabbing  pain that was worse with inspiration occurring  approximately 30 minutes after eating a meal.  About 30 minutes later  she developed some nausea and shortness of breath as well as right arm  numbness.  She finally contacted EMS and was taken to [facility] ED.  On  transport to the ED, she was treated aspirin and nitroglycerin and  thinks the aspirin may have helped although the nitroglycerin just gave  her a headache.  While in the ED she received morphine and Protonix with  improvement in symptoms.  This morning she had a  1 out of 10 dull ache  which is reproducible with palpation in the epigastrium.  We have been  asked to consult given her risk factors and ongoing chest pain.   ALLERGIES:  CELEBREX.   HOME MEDICATIONS:  1. Vicodin 7.5/500 mg p.r.n.  2. Humalog.  3. Fluticasone 50 mcg inhaler.  4. Enalapril 20 mg daily.  5. Actos 30 mg daily.  6. Lipitor 10 daily.  7. Hydroxyzine 50 mg.  8. Celexa 20 mg daily.  9. Bupropion XL 300 mg daily.  10.Lantus 40 units subcu q.p.m.   FAMILY HISTORY:  Mother died at age 9 of an MI.  Father died of lung  cancer at age 58.  She had a grandfather died at age 96 an MI and  diabetes and a grandmother who died at 2 of an MI.   SOCIAL HISTORY:  She lives in [place] with her two daughters and two  grandchildren.  [name] on disability and does not work.  She has a 45  pack-year history of tobacco abuse, quitting approximately 10 months  ago.  She previously drank heavily on weekends but quit this 2-1/2 years  ago.  She previously used cocaine and quit that 2-1/2 years ago as well.   REVIEW OF SYSTEMS:  Positive for unintentional 30-pound weight loss over  the past six months, spurred by her son dying and loss of appetite as  result of that.  She will occasionally have chills and headaches.  She  had chest pain, shortness breath, dyspnea on exertion as per HPI.  She  has also noted some palpitations wheezing lower extremity edema.  She   reports a 73-month history of straining to urinate.  She also has  intermittent right hand numbness, arthralgias and back pain and  constipation.   PHYSICAL EXAMINATION:  VITAL SIGNS:  Temperature 97.1, heart rate 66,  respirations 20, blood pressure 101/56, pulse ox 97% in  room air.  Weight is 96.3 kg.  GENERAL:  Pleasant obese white female in no acute distress, awake, alert  x3.  NECK:  No bruits or JVD.  LUNGS:  Respirations regular and unlabored. Clear to auscultation.  CARDIAC:  Regular S1 and S2.  No S3 or S4.  There is 2/6 systolic murmur  heard best at the right upper sternal border.  She has reproducible  substernal/epigastric chest pain.  ABDOMEN:  Obese, soft, nontender and nondistended with a mildly tender  epigastrium.  Bowel sounds present x4.  EXTREMITIES:  Warm, dry and pink.  No clubbing, cyanosis or edema.   Chest x-ray shows no acute disease.  Chest CT shows no PE, dissection or  aneurysm. EKG shows sinus rhythm with no acute ST-T changes.  Lab work:  Hemoglobin 12.2, hematocrit 35, WBC 7.3, platelets 318.  Sodium 136,  potassium 3.7, chloride 100, CO2 25, BUN 12, creatinine 0.79, glucose  122, D. dimer 0.76, calcium 9.5, total bilirubin 0.8, alkaline  phosphatase 93, AST 19, ALT 10, total protein 6.9, albumin 3.9.  Cardiac  enzymes are negative x2.  Hemoglobin A1c 7.  TSH 29.758.   ASSESSMENT AND PLAN:  1. Chest pain, atypical and reproducible.  However, given the      patient's recurrence of chest discomfort with action including      hypertension, hyperlipidemia and diabetes we will pursue an      inpatient adenosine Myoview tomorrow morning to rule out ischemia      definitively.  Continue aspirin, statin and ACE inhibitor.  2. Hypertension.  This is well controlled on enalapril therapy.  3. Hyperlipidemia.  She remains on statin therapy.  LFTs look okay.  I      do not see lipids in the system. 4. Depression and anxiety per primary team.  5. Hypothyroidism.   TSH is elevated at 29.758..  Per primary team.      She  was not previously on Synthroid.      Nicolasa Ducking, ANP      Bruce R. Juanda Chance, MD, [facility]  Electronically Signed    CB/MEDQ  D:  12/05/2006  T:  12/06/2006  Job:  [id number]

## 2011-02-08 NOTE — Op Note (Signed)
[name], [name]                ACCOUNT NO.:  [id number]   MEDICAL RECORD NO.:  [id number]          PATIENT TYPE:  INP   LOCATION:  0010                         FACILITY:  [facility]   PHYSICIAN:  Deidre Ala, M.D.    DATE OF BIRTH:  01-15-1961   DATE OF PROCEDURE:  06/17/2006  DATE OF DISCHARGE:                                 OPERATIVE REPORT   PREOPERATIVE DIAGNOSIS:  End-stage DJD left shoulder, glenohumeral joint  disease with deformity.   POSTOPERATIVE DIAGNOSIS:  End-stage DJD left shoulder, glenohumeral joint  disease with deformity.   PROCEDURE:  Left shoulder hemiarthroplasty using DePuy Global Advantage  System.   SURGEON:  1. Charlesetta Shanks, M.D.   ASSISTANT:  Clarene Reamer, [facility]   ANESTHESIA:  General endotracheal with preoperative scalene block.   CULTURES:  None.   DRAINS:  None.   BLOOD LOSS:  100 mL, replaced without.   PATHOLOGIC FINDINGS AND HISTORY:  [name] is a 50 year old who had previous  shoulder scope for debridement but has had increasing pain in her shoulder  with evidence of severe DJD with some head and neck deformity almost an  avascular necrosis.  No previous trauma.  At surgery she was extraordinarily  tight, but we were able to put a uncemented Advantage 8 stem with impaction  grafting proximally with appropriate retroversion and a 44 x 15 mm humeral  head which is smallest size.  This granted her marked improvement in range  of motion over preoperatively where she had only an arc of about 5 degrees,  internal, external rotation as well as about 40 degrees of forward flexion.  At the end of surgery she had a 45 degrees arc of internal, external  rotation, abduction to about 80, forward flexion at least 80.   PROCEDURE:  With adequate anesthesia obtained using endotracheal technique  after scalene block.  The patient was placed in supine beach chair position.  The left shoulder was prepped and draped in standard fashion.  After  standard  prepping and draping, a [place] modified deltopectoral incision  was made obliquely from the distal clavicle and just medial to it, down  across to the lateral pectoralis insertion area.  Incision was deepened  sharply with knife and hemostasis obtained using Bovie electrocoagulater.  Dissection was carried down to the deltopectoral interval which was  developed with blunt finger dissection.  We then placed retractors. I  released the lateral conjoined tendon and then placed deep self-retaining  retractors.  I then exposed the proximal humerus and brought out the  subscapularis and capsule in one flap just medial to the lesser tuberosity  and brought it up with cutting Bovie and tagged it with #1 fiber wire  sutures.  There was a large anterior osteophyte that had to be removed to  gain visual access to the joint.  Retractors were then placed. I placed  template and made the head cut removing the deformed small femoral head.  I  then did a labrum debridement. The glenoid surface actually looked with only  moderate degenerative change.  I then exposed the femoral  head used the  canal finder. I had released the pectoralis insertion proximally on the  humerus and did some capsular release.  I was able expose the humeral neck  which was extremely tight but eventually got the 8 reamer down, broached to  an 8, trialed the appropriate stem with retroversion at the 45 x 15 mm head  and was satisfied with it.  I then took some cancellous graft from the head,  impacted the actual Dupuy Global Advantage humeral stem 8 mm diameter,  length 133, and then impacted on the Global Advantage humeral head, 44 x 15  mm.  I reduced the glenohumeral joint with improved range of motion.  I then  repaired the anterior capsule with the Fiberwire back to the anterior fascia  with simple sutures.  I then supplemented the repair on the subscapularis,  supraspinatus interval with #1 Ethibond and inferiorly with #1  Ethibond.  I  then irrigated.  I then closed the conjoin tendon release with #1 running  locking Ethibond. I then closed the subcutaneous tissue with 0, 2-0 and 3-0  Vicryl and skin staples.  No drain was used.  Bulky sterile compressive  dressing was applied with sling and the patient having tolerated procedure  well, was awakened, taken to recovery room in satisfactory condition to be  admitted for routine postoperative care and analgesia.           ______________________________  V. Charlesetta Shanks, M.D.     VEP/MEDQ  D:  06/17/2006  T:  06/19/2006  Job:  [id number]

## 2011-02-08 NOTE — Op Note (Signed)
[name], [name]                ACCOUNT NO.:  [id number]   MEDICAL RECORD NO.:  [id number]          PATIENT TYPE:  INP   LOCATION:  5006                         FACILITY:  [facility]   PHYSICIAN:  Nelda Severe, MD      DATE OF BIRTH:  September 17, 1961   DATE OF PROCEDURE:  12/19/2006  DATE OF DISCHARGE:                               OPERATIVE REPORT   SURGEON:  Dr. Nelda Severe.   ASSISTANT:  Lianne Cure, P.A.-C.   PREOPERATIVE DIAGNOSIS:  L4-5 spondylosis and spinal stenosis.   POSTOPERATIVE DIAGNOSIS:  L4-5 spondylosis and spinal stenosis.   OPERATIVE PROCEDURE:  L4-5 laminectomy and posterolateral fusion with  local autogenous graft and beta-tricalcium phosphate (Vitoss) with bone  marrow; pedicle screw insertion of L4-5 bilaterally.   OPERATIVE NOTE:  The patient was placed under general endotracheal  anesthesia.  A Foley catheter was placed in the bladder.  Sequential  compression devices were placed on both lower extremities.  Antibiotic  was infused.   She was positioned prone on transverse bolsters.  Because of a previous  shoulder replacement on the left side, it was impossible to place the  left upper extremity in the usual position.  Therefore, the forearm was  well padded and placed with the hand between the transverse bolsters,  between her abdomen and the table.  The upper arm was abducted at the  side.  The right upper extremity was placed on an arm board in the usual  fashion, avoiding hyperabduction and hyperflexion of the shoulder and  avoiding hyperflexion of the elbows.  The right upper extremity was  padded with foam from axilla to hands.  The thighs, knees, shins and  feet were supported on pillows.   The lumbar area was prepped with DuraPrep and draped in rectangular  fashion.  The drapes were secured with Ioban.  The subcutaneous tissue  was injected with a mixture of quarter percent Marcaine with epinephrine  with 1% lidocaine.  Dissection was carried  down to the spinous processes  and the paraspinal muscles reflected bilaterally.  The last mobile  segment was identified.  A cross-table lateral radiograph was also used  to confirm level.   Pedicle holes were made bilaterally at L5 and L4 in the usual fashion.  The base of the superior articular process was identified, a piece of  bone removed with a Leksell rongeur, the posterior pedicle identified,  perforated with an awl and then a hole made through the pedicle into the  vertebral body using either a pedicle probe or 3.5-mm drill bit.  Each  hole was carefully palpated with a ball-tip probe to make sure it was  circumferentially intact and then sounded for depth and the depths  recorded.  Radio-opaque markers were placed in each hole after injecting  each hole with FloSeal to stop the bleeding.  Cross-table lateral  radiographs showed satisfactory position of markers.   In the meantime, we performed bilateral laminectomy and facetectomy of  L4 and the upper portion of L5.  This had the effect of completely  decompressing the L4 neural foramen bilaterally and  decompressing the  lateral recess at L5 bilaterally.  We harvested bone graft meticulously  as the laminectomy was performed.  This revealed a moderate amount of  bone which was then mixed with 15 mL of beta-tricalcium phosphate  (Vitoss) which had been mixed with 15 mL of bone marrow aspirate from  the right posterior iliac crest, obtained via an 18-gauge spinal needle.   We then decorticated the transverse processes at L4 and L5 on the right  side and placed a moderately large quantity of bone graft/Vitoss at that  location.  Pedicle screws were then placed at L5 and L4 on the right  side.  These were stimulated and distal EMG activity monitored.  The  current required to stimulate was high enough that there was very little  likelihood that there was any direct contact between the screw and nerve  root.   We then did  exactly the same thing on the left side, decorticating the  transverse process and lateral aspects of the superior articular  processes of L4 and L5 and placing bone graft posterolaterally.  Screws  were again inserted, stimulated and the currents were at levels high  enough to suggest that there was no contact between screw thread and  nerve root.   We then coupled the screws to the rods.  Cross-table lateral radiographs  were satisfactory, actually done before attaching both rods.   We then closed the wound using continuous #1 Vicryl suture in the lumbar  fascia over a 15-gauge Blake drain placed subfascially.  The Blake drain  was secured to the skin using a 2-0 nylon in basket-weave fashion.  An  1/8-inch Hemovac drain was placed in the subcutaneous layer which was  very thick.  It was also secured to the 2-0 nylon suture in basket-weave  fashion.  The subcutaneous layer was closed using interrupted 2-0 Vicryl  and continuous 2-0 Vicryl suture.  The skin was closed using a  continuous subcuticular 3-0 undyed Vicryl.  The skin was reinforced with  Dermabond.  Antibiotic ointment dressing was then applied and secured  with OpSite.   The patient was then placed in her bed, awakened and brought to the  recovery room where she was in satisfactory condition and could move all  limbs.   Blood loss estimated at about 400 to 500 mL.   No intraoperative complications.  Sponge and needle counts were correct.      Nelda Severe, MD  Electronically Signed     MT/MEDQ  D:  12/19/2006  T:  12/20/2006  Job:  [id number]

## 2011-02-08 NOTE — Op Note (Signed)
. [facility]  Patient:    [name], [name] Visit Number: [id number] MRN: [id number]          Service Type: DSU Location: [facility] Attending Physician:  Teena Dunk Dictated by:   Sharlot Gowda., M.D. Proc. Date: 01/29/02 Admit Date:  01/29/2002 Discharge Date: 01/29/2002                             Operative Report  PREOPERATIVE DIAGNOSES: 1. Split depressed lateral tibial plateau fracture, left knee. 2. Lateral meniscus tear. 3. Possible partial anterior cruciate ligament.  POSTOPERATIVE DIAGNOSES: 1. Split depressed lateral tibial plateau fracture, left knee. 2. Lateral meniscus tear. 3. Possible partial anterior cruciate ligament.  PROCEDURES: 1. Arthroscopic-aided lateral tibial plateau fracture with 6.5 Ace titanium    screws. 2. Partial medial meniscectomy.  SURGEON:  Sharlot Gowda., M.D.  ASSISTANT:  Jamelle Rushing, P.A.  ANESTHESIA:  General.  INDICATION:  A 50 year old, fell approximately five to six days prior to being seen in the office.  Was seen earlier this week in the office, noted to have a depressed lateral tibial plateau fracture, based on her young age was thought to be amenable to open reduction with an arthroscope with possible substitute bone graft, as an overnight.  She was advised that without this she would stand a fairly good chance of having an arthritic knee balanced against the general risk of surgery.  She opted for the surgery.  DESCRIPTION OF PROCEDURE:  Arthroscope through superomedial, inferomedial, and inferolateral portal.  Systematic inspection of the joint showed the patellofemoral and medial compartment to be normal with some hemorrhage in the ACL but no significant rupture.  Lateral plateau showed more depression than a split.  It was elected to elevate the depression.  For this reason an accessory small incision was made laterally, a small window created after  the Concept tibial guide was used to place a pin just short of the joint. Elevation around this was carried out after insertion of an 8 mm cannulated drill bit just short of the joint.  elevation was accomplished, followed by placement of bone conduit from the DePuy system, 5 cc, packing the bone to pack up the depression.  This was followed by placement of the two pins from lateral to medial for the 6.5 Ace screws.  One of the screws fit extremely well in the lateral plateau.  One placed slightly inferior to this did not bite as well, but certainly this kept reduced the split compression fracture, confirmed to be in good position with the arthroscope as well as the OEC. Small meniscus tear representing about 15-20% of the meniscus substance was trimmed.  Again this was a relatively stable construct.  Closure was effected over the incisions with 2-0 Vicryl and skin clips.  Marcaine with epinephrine in the wound and the joint.  Taken to the recovery room in stable condition. A lightly compressive sterile dressing applied. Dictated by:   Sharlot Gowda., M.D. Attending Physician:  Teena Dunk DD:  01/29/02 TD:  02/01/02 Job: [id number]

## 2011-02-08 NOTE — Assessment & Plan Note (Signed)
[facility]                            CARDIOLOGY OFFICE NOTE   NAME:[name], Elizabeth PESCH                       MRN:          [id number]  DATE:12/11/2006                            DOB:          10-19-1960    [name] is seen today as a new [name] to me, however, she is not  new to [facility].  She has just been seen by Dr. Juanda Chance in [facility]  on December 05, 2006.  [name] a 50 year old female with multiple coronary  risk factors, including hypertension, hyperlipidemia, diabetes.  She was  admitted with epigastric and substernal chest pain.   She had a CT scan that was negative for aneurysm or dissection.  She had  a low risk Myoview scan which showed an EF of 76%.   [name] needs back surgery.  She has chronic lower back problems  since [facility] late 1990s.  [name] a previous alcoholic, and fell a lot.   In talking to [name], she has not had any problems since her  discharge from [facility].   [name] allergic to CELEBREX.   Her medications included Vicodin 7.5/500, Humalog as indicated by  sliding scale, fluconazole 50 mcg a day, enalapril 28 a day, Actos 30 a  day, Lipitor 10 a day, Celexa 20 a day, Lantus 40 units subcu at night.   Her mother died at age 11 of an MI, her father died of lung cancer at  age 5.  She lives in [place] with her 2 daughters and 2  grandchildren.  [name] on disability.  She quit smoking in June of 2007.  She reports being dry for at least 2-1/2 years.   Her exam is remarkable for a blood pressure of 120/70, pulse 77 and  regular.  HEENT:  Normal.  LUNGS:  Clear.  Carotid is normal without bruit.  There is an S1, S2 with normal heart  sounds.  ABDOMEN:  Benign.  LOWER EXTREMITIES:  Intact pulses, no edema.   EKG shows sinus rhythm.  She has mildly abnormal R-wave progression  across [facility] precordium and left axis deviation.   IMPRESSION:  A 50 year old diabetic, hypertensive with multiple coronary  risk factors.  [name] was just hospitalized for chest pain.  She  had a low risk Myoview study.  [name] clear for any type of back surgery  that she may need.  [name] will continue her current medications in  regards to her diabetes, blood pressure, and hyperlipidemia.   We would be happy to follow [name] in [facility] if Dr. Alveda Reasons  needs [place] to.     Noralyn Pick. Eden Emms, MD, [facility]  Electronically Signed    PCN/MedQ  DD: 12/11/2006  DT: 12/11/2006  Job #: [id number]

## 2011-02-08 NOTE — H&P (Signed)
[name], BAUS                ACCOUNT NO.:  [id number]   MEDICAL RECORD NO.:  [id number]          PATIENT TYPE:  INP   LOCATION:  2550                         FACILITY:  [facility]   PHYSICIAN:  Nelda Severe, MD      DATE OF BIRTH:  1961-05-24   DATE OF ADMISSION:  12/19/2006  DATE OF DISCHARGE:                              HISTORY & PHYSICAL   SUBJECTIVE:  She has come in to [facility] secondary to lumbar  pain with right leg pain.   PAST MEDICAL HISTORY:  Includes coronary artery disease, hypertension,  diabetes.   DRUG ALLERGIES:  CELEBREX, __________ ALLERGY.   MEDICATIONS:  Include fluconazole, Lantus, Lipitor, citalopram, HCTZ,  hydroxyzine, enalapril, Vicodin, Actos, bupropion, Humalog.   PAST SURGICAL HISTORY:  ORIF of left ankle, right __________shoulder  surgery, and cervical spinal fusion.  [name] a nonsmoker.  She stopped  smoking 11 months ago.   REVIEW OF SYSTEMS:  She reports no fever, no chills, no shortness of  breath, no bleeding tendencies, no bowel or bladder incontinence.  She  does use a wheelchair for mobility.  She can transfer independently from  the wheelchair to her bed or to the bathroom.   PHYSICAL EXAMINATION:  GENERAL:  In general, she appears well-developed,  well-nourished.  HEENT:  Normocephalic.  Her pupils are equal, round, and react to light.  She does have decreased vision secondary to diabetes.  NECK:  Range of motion is approximately 50%.  CHEST:  Clear to auscultation.  HEART:  Regular rate and rhythm.  ABDOMEN:  Soft, nontender.  EXTREMITIES:  She has sensation grossly intact and equal bilaterally,  but she does have peripheral neuropathy secondary to diabetes.  She has  __________ of her toes.  Limbs __________ ankle status post surgery.  Palpable DP pulses.   X-rays were reviewed at the office, shows spinal stenosis, lumbar disk  herniation at L4-5 and spinal stenosis __________ posterior lumbar  fusion with  laminectomy L4-5 with iliac crest bone graft by Dr. Nelda Severe.      Lianne Cure, P.A.      Nelda Severe, MD     MC/MEDQ  D:  12/19/2006  T:  12/19/2006  Job:  [id number]

## 2011-02-08 NOTE — Consult Note (Signed)
[name]                ACCOUNT NO.:  [id number]   MEDICAL RECORD NO.:  [id number]          PATIENT TYPE:  INP   LOCATION:  5006                         FACILITY:  [facility]   PHYSICIAN:  Antonietta Breach, M.D.  DATE OF BIRTH:  1961/09/20   DATE OF CONSULTATION:  12/23/2006  DATE OF DISCHARGE:                                 CONSULTATION   REQUESTING PHYSICIAN:  Nelda Severe, MD   REASON FOR CONSULTATION:  Thoughts of harming herself, thoughts of  harming others.Marland Kitchen   HISTORY OF PRESENT ILLNESS:  [name] is a 50 year old female  admitted to the [facility] on December 19, 2006, due to severe lumbar  pain and right leg pain.   [name] reports difficulty controlling her anxiety.  She has had  recent attacks of severe feeling on edge along with tremor and a sense  of doom.  She states that this does respond acutely to Xanax.   Earlier this morning she was extremely anxious and expressed thoughts of  harming herself and others.  She states that she no longer is having  those thoughts now that she is relieved from the severe anxiety.   She has no hallucinations or delusions.  Her interests and hope are  intact when not having a severe anxiety attack.  She looks forward to  time with her grandchildren.  She enjoys her church very much.   [name] currently socially appropriate and oriented to all spheres.   PAST PSYCHIATRIC HISTORY:  The patient has a long-term history of  intense feeling on edge, excess worry, muscle tension.  Recently her  anxiety symptoms have increased.   She also has a history of severe depression involving anhedonia,  decreased energy, decreased concentration.   Wellbutrin has worked well for the depression and initially the Celexa  was helping her anxiety.   She does have a history of attempting suicide in 2001.  That resulted in  a psychiatric admission.  She has no history of decreased need for  sleep, high energy or other manic  symptoms.  She has no history of  hallucinations or delusions.   The patient does have a history of multiple depressive episodes that  have occurred when she has stopped her antidepressant medication.  She  has not had a recent psychotherapy course.  She has not had adverse  effects from her Wellbutrin or Celexa.   FAMILY PSYCHIATRIC HISTORY:  None known.   OCCUPATION:  Medically disabled.   MARITAL HISTORY:  Divorced.   CHILDREN:  Four children.  One died last year, which precipitated  another depression.  She has grieved well.   [name] living with two daughters and enjoys her local church, which is  nearby.  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   In the past she consumed alcohol on a regular basis excessively.  She  has not had any alcohol in 3 years.  She also has a history of cocaine  abuse remotely but no longer uses illegal drugs.   GENERAL MEDICAL PROBLEMS:  Please see the history of present illness.  The patient also has osteoarthritis.  Surgical history includes neck  surgery and tonsillectomy.   MEDICATIONS:  The MAR is reviewed.  The patient was given a dosage of 2  mg of Ativan earlier today, which calmed her panic attack as described  above.   She also his on Xanax 0.25 mg q.6h. p.r.n., and she has received two of  those dosages today.  Her other psychotropic medication includes  Wellbutrin 300 mg daily, Celexa 20 mg q.h.s., Atarax 50 mg daily.   [name] on Restoril 15-30 mg q.h.s. p.r.n.   She has allergies to CELECOXIB, which causes GI upset.   LABORATORY DATA:  WBC 6.7, hemoglobin 8.5, platelet count 280.  Metabolic panel is showing the BUN 2, creatinine 0.68, calcium 8.8.  The  patient's hemoglobin A1c is elevated at 7.3.   REVIEW OF SYSTEMS:  CONSTITUTIONAL:  Afebrile.  HEAD:  No trauma.  EYES:  No visual changes.  EARS:  No hearing impairment.  NOSE:  No rhinorrhea.  MOUTH/THROAT:  No sore throat.  NEUROLOGIC:   No history of seizures.  PSYCHIATRIC:  As above.  CARDIOVASCULAR:  No chest pain, palpitations or  edema.  RESPIRATORY:  No coughing or wheezing.  GASTROINTESTINAL:  No  nausea, vomiting, diarrhea.  GENITOURINARY:  No dysuria.  SKIN:  Unremarkable.  MUSCULOSKELETAL:  Unremarkable.  ENDOCRINE/METABOLIC:  History of diabetes.  HEMATOLOGIC/LYMPHATIC:  Anemia.   EXAMINATION:  VITAL SIGNS:  Temperature 97.6, pulse 107, respiration 20,  blood pressure 139/72, O2 saturation on room air 95%.  MENTAL STATUS EXAM:  [name] is a middle-aged female appearing her  chronologic age lying in a supine position in her hospital bed.  [name]  alert.  Her eye contact is good.  Her affect is slightly anxious at  baseline but with a broad, appropriate response.  Her mood is slightly  anxious.  [name] oriented completely to all spheres.  Her memory is  intact to immediate, recent and remote.  Her fund of knowledge and  intelligence are within normal limits.  Her speech involves normal rate  and prosody without dysarthria.  Her thought process is logical,  coherent, goal-directed.  No looseness of associations.  Thought  content:  No thoughts of harming herself, no thoughts of harming others  no delusions, no hallucinations.  Concentration is mildly decreased.  Judgment is intact.  Insight is good.  [name] socially appropriate and  cooperative with the interview.  [name] well-groomed.   ASSESSMENT:  Axis I:  1,  293.84, anxiety disorder not otherwise specified.  The patient has  panic disorder symptoms as well as generalized anxiety disorder  symptoms.  Also, she has an aggravating general medical factor of pain.  1. Major depressive disorder, recurrent, in partial remission.  2. Polysubstance dependence, in remission.   Axis II:  Deferred,  Axis III:  See general medical problems.  Axis IV:  General medical.  Axis V:  55.  [name] is not at risk to harm herself or others.  She agrees to  call  emergency services immediately for any thoughts of harming herself,  thoughts of harming others or distress, including an unrelenting panic  attack.   The undersigned provided ego-supportive psychotherapy and education.  The indications, alternatives and adverse effects of the following were  discussed with the patient:  Celexa for both antidepression and  antianxiety; Wellbutrin for antidepression; benzodiazepines including  Xanax for anti acute anxiety, including  the risk of dependence.   The patient understands above information and wants to proceed with the  following.  She also concurs with psychotherapy and would like to have  some help arranging that.   RECOMMENDATIONS:  Would ask the social worker to set this patient up  with an outpatient psychiatric appointment with a psychotherapist as  well.  A psychiatrist can manage her psychotropic medications; however,  it is unusual for a psychiatrist in the community to provide  psychotherapy.  Therefore, would ask the social worker to find a clinic  that can provide psychotherapy, specifically cognitive behavioral  therapy with progressive muscle relaxation and deep breathing training  (this form of therapy can be extremely effective with anxiety attacks).   Possible clinics include the clinics attached to [facility],  [facility] or [facility].  The patient states that at her current  outpatient facility she has not been able to obtain psychotherapy.   Would increase the Celexa to 30 mg p.o. q.a.m. for antianxiety and  antidepression.  The patient may require an additional increase to 40 mg  q.a.m. for optimal antianxiety benefit, optimal SSRI benefit for  anxiety, particularly the cognitive symptoms can take as long as 16  weeks, usually yet 75-100% of the maximum daily dosage.   Would continue the Wellbutrin at 300 mg daily for antidepression,  synergizing with the Celexa.   Would continue Xanax at 0.25 to 1 mg  t.i.d. p.r.n. anxiety with a goal  of eventually discontinuing it as the Celexa and psychotherapy take  effect.   Would utilize the Xanax as a single benzodiazepine for both daytime  anxiety and insomnia and would avoid using multiple benzodiazepine.      Antonietta Breach, M.D.  Electronically Signed     JW/MEDQ  D:  12/23/2006  T:  12/24/2006  Job:  [id number]
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
## 2011-02-08 NOTE — H&P (Signed)
[name], [name]                ACCOUNT NO.:  [id number]   MEDICAL RECORD NO.:  [id number]          PATIENT TYPE:  INP   LOCATION:  2550                         FACILITY:  [facility]   PHYSICIAN:  Nelda Severe, MD      DATE OF BIRTH:  02-Jan-1961   DATE OF ADMISSION:  12/19/2006  DATE OF DISCHARGE:                              HISTORY & PHYSICAL   SUBJECTIVE:  She has come in to [facility] secondary to lumbar  pain with right leg pain.   PAST MEDICAL HISTORY:  Includes coronary artery disease, hypertension,  diabetes.   DRUG ALLERGIES:  CELEBREX, __________ ALLERGY.   MEDICATIONS:  Include fluconazole, Lantus, Lipitor, citalopram, HCTZ,  hydroxyzine, enalapril, Vicodin, Actos, bupropion, Humalog.   PAST SURGICAL HISTORY:  ORIF of left ankle, right __________shoulder  surgery, and cervical spinal fusion.  [name] a nonsmoker.  She stopped  smoking 11 months ago.   REVIEW OF SYSTEMS:  She reports no fever, no chills, no shortness of  breath, no bleeding tendencies, no bowel or bladder incontinence.  She  does use a wheelchair for mobility.  She can transfer independently from  the wheelchair to her bed or to the bathroom.   PHYSICAL EXAMINATION:  GENERAL:  In general, she appears well-developed,  well-nourished.  HEENT:  Normocephalic.  Her pupils are equal, round, and react to light.  She does have decreased vision secondary to diabetes.  NECK:  Range of motion is approximately 50%.  CHEST:  Clear to auscultation.  HEART:  Regular rate and rhythm.  ABDOMEN:  Soft, nontender.  EXTREMITIES:  She has sensation grossly intact and equal bilaterally,  but she does have peripheral neuropathy secondary to diabetes.  She has  __________ of her toes.  Limbs __________ ankle status post surgery.  Palpable DP pulses.   X-rays were reviewed at the office, shows spinal stenosis, lumbar disk  herniation at L4-5 and spinal stenosis __________ posterior lumbar  fusion with  laminectomy L4-5 with iliac crest bone graft by Dr. Nelda Severe.      [name], P.A.      Nelda Severe, MD  Electronically Signed    MC/MEDQ  D:  12/19/2006  T:  12/19/2006  Job:  [id number]

## 2011-02-08 NOTE — H&P (Signed)
[name], [name]                ACCOUNT NO.:  [id number]   MEDICAL RECORD NO.:  [id number]            PATIENT TYPE:  INP   LOCATION:                               FACILITY:  [facility]   PHYSICIAN:  Nelda Severe, MD      DATE OF BIRTH:  November 14, 1960   DATE OF ADMISSION:  12/19/2006  DATE OF DISCHARGE:                              HISTORY & PHYSICAL   ADDENDUM   The history and physical was performed on December 19, 2006.  [name] under  the care of Dr.  Nelda Severe.   [name] admitted to the [facility] secondary to severe low back  pain as well as left leg pain.  A 50 year old female.  Date of admission  was December 19, 2006.   PAST MEDICAL HISTORY:  Hypertension, diabetes, and depression.   ALLERGIES:  Her known drug allergy is to CELEBREX secondary to causing  stomach problem.   PAST SURGICAL HISTORY:  Bilateral feet secondary to high arches, left  ankle fracture, ORIF, right inguinal hernia repair, tonsillectomy age  79, and left knee fracture repair in 2003.   MEDICATIONS:  We have fluconazole, Lantus, Lipitor, citalopram, HCTZ,  hydroxyzine, enalapril, Vicodin, Actos, bupropion, and Humalog.   REVIEW OF SYSTEMS:  She reports no fever, no chills, no shortness of  breath, no bleeding tendencies, or no bowel or bladder incontinence.  She uses a wheelchair for most mobilities secondary to the lumbar and  leg pain.  She can transfer independently from wheelchair to bed to  bathroom.  No shortness of breath.  No chest pain.   PHYSICAL EXAMINATION:  GENERAL:  She appears normocephalic, obese.  HEENT:  Her pupils are equal, round, and reactive to light.  NECK:  Neck is supple.  She does have approximately 50% loss of active  range of motion.  CHEST:  Clear to auscultation.  No wheezes noted.  HEART:  Regular rate and rhythm.  No murmur was noted.  ABDOMEN:  Soft, nontender to palpation.  Positive bowel sounds  auscultated.  EXTREMITIES:  [name] grossly neurovascularly intact.   She has decreased  ankle range of motion secondary to surgery, decreased toe range of  motion secondary to foot surgery.  She does have palpable DP and PT  pulses that are intact and equal.  She has pain with straight leg raise  on the right lower extremity that radiates from the back.  She has  limited flexion and extension of her lumbar spine.  Extension increases  pain considerably, and she has difficulty coming to a neutral standing  position.   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   OPERATIVE PLAN:  L4-5 laminectomy, posterolateral fusion with local  autogenous graft and beta tricalcium phosphate, Vitoss, pedicle screws  and rods L4-5 bilaterally.   The surgery is performed by Dr. Nelda Severe.      Lianne Cure, P.A.      Nelda Severe, MD  Electronically Signed    MC/MEDQ  D:  04/14/2009  T:  04/15/2009  Job:  [id number]

## 2011-02-08 NOTE — Discharge Summary (Signed)
NAMESHANIK, [name]                ACCOUNT NO.:  [id number]   MEDICAL RECORD NO.:  [id number]          PATIENT TYPE:  INP   LOCATION:  5006                         FACILITY:  [facility]   PHYSICIAN:  Lianne Cure, P.A.  DATE OF BIRTH:  1960-12-18   DATE OF ADMISSION:  12/19/2006  DATE OF DISCHARGE:                               DISCHARGE SUMMARY   POSTERIOR LUMBAR SURGERY:   PREOPERATIVE DIAGNOSIS:  L4-5 spondylolysis with spinal stenosis.   OTHER ADMITTING DIAGNOSES:  Include:  1. Coronary artery disease.  2. Hypertension.  3. Diabetes.   On December 19, 2006, she was taken to the OR by Dr. Nelda Severe.  He  performed a L4-5 laminectomy and posterolateral fusion with local bone  graft, pedicle screw insertion at L4-5 bilaterally.  The patient  tolerated the surgery well.  She was then taken to 5006.  Postop day 1,  the patient was stable.  She complained of having increased anxiety.  She did have a hyperglycemic withdrawal.  She was given insulin.  She  was afebrile.  Hemoglobin is stable at 9.6.  Sodium is 130, serum  potassium of 4.0.  Glucose was measured at 400; this a.m. it was 419.  Drains were retained.  Postop day 2, the patient was afebrile.  Vital  signs were stable.  Hemoglobin is stable at 8.3.  The patient reports no  dizziness, no lightheadedness, drain outflow 110 mL total.  White count  9.1.  Distally, excellent range of motion, grossly intact bilateral  lower extremities, and calves were soft and nontender, TED hose in place  and SCDs, moving well.  She did have some serosanguineous drainage at  the distal portion of her wound and ileus is resolved.  [name] passing  gas and advancing diet.  Drains were discontinued.  PCA was maintained.  She had a hypoglycemic event on December 22, 2006, of 44.  Insulin was  held.  She was given 15 g of carbohydrate snack, and her blood sugar  exam was 132.  On December 22, 2006, hemoglobin was stable at 8.1.  The  patient is sitting  at bedside independently.  Incision being dry and  intact today and positive ecchymosis.  No active drainage.  Sensation  grossly intact.  Excellent range of motion, grossly intact bilateral  lower extremities.  Calves were soft, nontender to palpation.  We  discontinued the PCA, disconnected dressing.  We did use Dermabond on  the wound, postoperatively.  On April 1st of 2008, the patient was  afebrile and stable.  White count was 6.7, hemoglobin stable at 8.5.  She complains of increased anxiety, spoke with the nurse, had suicidal  thoughts and harming others.  We ordered a stat consult with Dr.  Jeanie Sewer.  He changed her prescriptions of Celexa and Xanax.  She does  have moderate amount of serosanguineous drainage.  We did culture and  sensitivity and sent it to the lab and asked the nurses to do dressing  changes when dressing is saturated.  On postop day 4, the wound was  cleaned with alcohol, clean dry dressing.  We talked with social work on  the floor about getting advanced tone care, social work as well as PT/OT  and Charity fundraiser daily dressing changes and wound check once she goes home.  On  December 24, 2006, drainage has decreased its serosanguineous fluid.  T-max  was 99.1.  Vital signs were stable.  Cultures, no organism seen.  Her  white count was 5.5 and hemoglobin stable at 8.4.  She states she feels  much better as far as her anxiety and that she has increased her  activity with clinical therapy.  She walked to the nursing station.  On  December 25, 2006, the patient is doing much better, wants to know when she  can go home.  Drainage significantly decreased.  A dry dressing was  applied.  There is no erythema or ecchymosis, no active drainage or  bleeding.  She has been increasing her ambulation with physical therapy.  Today, on December 26, 2006, drainage significantly decreased.  There is no  erythema.  Final cultures __________ have been seen.  The patient is  going to be discharged  home.   DISCHARGE DIAGNOSIS:  1. Status post lumbar laminectomy and fusion L4-5.  2. Anxiety.  3. Hypertension.  4. Coronary artery disease.  5. Diabetes.   PLAN:  [name] going to follow up with mental health services as an  outpatient.  [name] going to get an appointment.  [name] already  established.  We are also going to order physical therapy, occupational  therapy, RN for wound check and dressing changes.  I sent her home with  Robaxin 500 mg count of 60 with 1 refill, 1 every 6 p.r.n. for muscle  spasms.  Lorcet 10 1-2 every 6 for pain, count of 120 with no refills,  and Xanax 0.5 mg 1 tablet every 6 p.r.n. for anxiety count of 60 with 1  refill so she can establish with her outpatient mental health.  Also  spoke with the social worker on the floor.  We are going to retain  advanced tone care for this need to be established, and we need to get  transportation with a nonemergent ambulance to take her home.  [name]  going to follow up with Dr. Nelda Severe in 3 weeks, and she is going  to follow up with Mental health in the next 3-4 weeks as well.      Lianne Cure, P.A.     MC/MEDQ  D:  12/26/2006  T:  12/26/2006  Job:  [id number]

## 2011-02-08 NOTE — Discharge Summary (Signed)
[name], [name]                ACCOUNT NO.:  [id number]   MEDICAL RECORD NO.:  [id number]          PATIENT TYPE:  INP   LOCATION:  5013                         FACILITY:  [facility]   PHYSICIAN:  Nelda Severe, MD      DATE OF BIRTH:  Dec 09, 1960   DATE OF ADMISSION:  02/05/2005  DATE OF DISCHARGE:  02/07/2005                                 DISCHARGE SUMMARY   HOSPITAL COURSE:  This woman was admitted for management of cervical  spondylosis associated with cervical myelopathy.  The day of admission, she  was taken to the operating room where C4-5, C5-6, C6-7 anterior  decompression and fusion were carried out.  Fusion devices included  interbody cages and anterior titanium plate and screws.   Postoperatively, her diabetes was managed by the [facility].  There were no complications associated with the surgery per se.  Drain has  been removed this morning.   She has been advised that she may shower.  She has been advised that she  should wear her Aspen collar when up and around.  She can take off her iliac  crest wound dressing in 2 days' time.  It is occluded with OpSite and she  may shower with the dressing on.  She has Percocet at home, so no discharge  medications are given at this time.   [name] ambulatory and moving well.   FINAL DIAGNOSIS:  Cervical spondylosis with myelopathy.   CONDITION ON DISCHARGE:  Stable, ambulatory.   FOLLOWUP:  Follow up in office in 2 weeks.   DIET:  Additionally, she has been advised to eat soft food for the next 5  days to avoid any esophageal obstruction.      MT/MEDQ  D:  02/07/2005  T:  02/07/2005  Job:  [id number]

## 2011-02-08 NOTE — Op Note (Signed)
[name], [name]                ACCOUNT NO.:  [id number]   MEDICAL RECORD NO.:  [id number]          PATIENT TYPE:  INP   LOCATION:  2550                         FACILITY:  [facility]   PHYSICIAN:  Nelda Severe, MD      DATE OF BIRTH:  19-Aug-1961   DATE OF PROCEDURE:  02/05/2005  DATE OF DISCHARGE:                                 OPERATIVE REPORT   SURGEON:  Nelda Severe, M.D.   ASSISTANT:  1. Charlesetta Shanks, M.D and Junita Push, R.N.     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   PROCEDURE: Anterior C4-5, C5-6, C6-7 decompression and fusion with interbody  spacers, autograft, anterior plate and screws, left anteror iliac crest bone  graft harvest   INDICATIONS FOR PROCEDURE:  Cervical myelopathy.   DESCRIPTION OF PROCEDURE:  The patient was placed under general endotracheal  anesthesia.  A Foley catheter was placed in the bladder.  She was positioned  supine on the operating room with a transverse bump under her scapulae.  Her  arms were at her side and tucked in place.  A bump had been placed under the  left hip to facilitate bone graft harvest.  The head was supported on foam.   The anterior cervical area and left anterior iliac crest area were prepped  with DuraPrep and draped in a square fashion.  The drapes were secured with  Ioban.  A slightly oblique incision was made from the midline, left to the  anterior border of the sternocleidomastoid at approximately the level of C4-  5.  Dissection was carried through the subcutaneous fat and platysma.  The  deep cervical fascia was bluntly dissected.  The anterior aspect of the  spinal column cleared of soft tissue bluntly.  A cross-table lateral  radiograph was taken with a needle in what was perceived to be the C4-5  disk, and proved to be so.  The radiograph was taken to the  radiology  department and interpreted during the surgery.  This confirmed the level.   The longus coli muscles were mobilized from C4 proximally to C7 distally.  A  ShadowLine retractor was then placed.  The C4-5 disk was fenestrated and  enucleated using curets.  A small amount of the inferior lip of C4 was  burred away.  Foraminotomies were performed using a 2 mm Kerrison rongeur.  A probe which could be admitted to the neural foramen on both sides, and  appeared clear of obstruction.   Next, the anterior osteophyte at C5-6 was removed and the disk space  entered.  The bulk of the disk was curetted out and removed with pituitary  rongeurs.  A high-speed bur was then used to remove the anterior inferior  lip of C5, and to remove the end plate articular cartilage above and below,  and to bur away the spinal phytes posteriorly.  Ultimately a #3.0 curved  curet could be admitted to the spinal canal and used to create an  interval  between the posterior longitudinal ligament and the annulus and the  posterior aspect of the C5 and C56 bodies.  Once again, a 2 mm Kerrison  rongeur was used to further remove spinal phytes posteriorly.  Next,  foraminotomies were performed.  A nerve hook was used to dissect between the  dura and the posterior longitudinal ligament and a transverse incision made  across the outer fibers of the annulus and the longitudinal ligament.  The  remaining annulus and longitudinal ligament were then removed with a  rongeur, revealing dura.   Next, we removed the retractors to the C6-7 disk space.  This was overgrown  anteriorly with bone, which was removed partly with the rongeur and partly  with the bur.  The high-speed bur was used to bur the inferior end plate of  C6 and upper of C7, in order to create more of a space to do the posterior  decompression.  Once again, the posterior spinal phytes were burred down  until they were quite thin and then removed with curets  and Kerrison  rongeurs.  The annulus and posterior longitudinal ligament were removed in a  similar fashion to that, which was described for C5-6.  Foraminotomies were  performed bilaterally with the Kerrison rongeur and a nerve hook admitted  freely.   Next, we harvested a left anterior iliac crest cancellus graft.  A short  incision was made over the anterior iliac crest, approximately 3 cm  posterior to the anterior superior spine.  Dissection was carried down onto  the bone, and the external oblique fascia mobilized medially and proximally,  and the gluteal fascia mobilized laterally and distally.  The top of the  crest was fenestrated using a high-speed bur.  We then used curets and  osteotomes to remove a moderately large amount of cancellus bone from  between the tables of the crest.  The wound was then irrigated and Gelfoam  packed into the defect.   Next, fusions were performed starting at C6-7 and working proximally using  Signus lordotic cages packed with the harvested bone graft.  At each level,  trial sizers were used to find the best fit, and then a lordotic cage  chosen, packed with bone graft, and implanted.  While inserting the bone  graft, the anesthetist applied some traction to the head through a  previously-applied head Holter.   Next, a 59 mm titanium X-Spine plate was chosen to span from C4 to C7, and  14 mm titanium screws were then used to attach this proximally and distally.  At the C6 level two 14 mm screws were inserted, but at C5 only one was  placed on the right side. The reason for this was that the plate was not  sitting flush with the bone on the left side at the C5 level.  A cross-table  lateral radiograph was taken prior to placing the intermediate screws.  This  confirmed that we had placed the proximal screws at C4, the correct level. Because of the patient's size and shoulders, the distal screws at C7 could  not be evaluated intraoperatively, as is  the usual case.   The screws were all seated.  The remaining bone graft was then packed  through the fenestrations in the front of the plate and pushed laterally  into the interbody space in front of the Signus cages.  A 1/4 inch Penrose  drain was then placed in the depths of the wound.  The platysma was  closed  using interrupted #2-0 Vicryl sutures.  The skin was then closed using  continuous #3-0 Vicryl, securing the skin edges and reinforced with Steri-  Strips.  The 1/4 inch Penrose drain was secured with a #3-0 nylon.  Non-  adherent ointment dressing was then applied.   The fascia of the lumbar wound was closed using #0 Vicryl sutures.  The  subcutaneous tissue was closed using #2-0 Vicryl sutures, and the skin  closed using a subcuticular #3-0 Vicryl, reinforced with Steri-Strips.  Non-  adherent ointment dressing was then applied.   ESTIMATED BLOOD LOSS:  Was 150 mL.   COMPLICATIONS:  There were no intraoperative complications.  The patient was  stable throughout the procedure and the sponge and needle counts were  correct.   At the time of dictation, the patient is just being awakened, so it is  impossible to report any neurologic findings here.      MT/MEDQ  D:  02/05/2005  T:  02/05/2005  Job:  [id number]

## 2011-02-08 NOTE — Consult Note (Signed)
NAMEPALOMA, GRANGE                ACCOUNT NO.:  [id number]   MEDICAL RECORD NO.:  [id number]          PATIENT TYPE:  INP   LOCATION:  5013                         FACILITY:  [facility]   PHYSICIAN:  Melissa L. Ladona Ridgel, MD  DATE OF BIRTH:  [birth year]   DATE OF CONSULTATION:  02/05/2005  DATE OF DISCHARGE:                                   CONSULTATION   REASON FOR THE CONSULT:  Help with diabetic and hypertensive management.   Consult was called by Dr. Alveda Reasons.   HISTORY OF PRESENT ILLNESS:  The patient is a 50 year old white female  status post anterior cervical fusion of C4-C5, C5-C6, and C6-C7.  The  patient has a history of noncompliance with medications and ethanol abuse.  Currently, she only occasionally drinks beer, and that has been for about  five months.  I was asked by Dr. Alveda Reasons to assist with her postoperative  diabetic and hypertensive management.  At this time, the patient states that  her blood glucoses have been well managed as well as her hypertension.   PAST MEDICAL HISTORY:  1.  As stated, ethanol abuse.  2.  Diabetes.  3.  Congenital foot problem.  4.  Hypertension.  5.  Suicide attempts x 2, with behavioral health admissions.  6.  The patient relates that she recently started to menstruate after three      months of no menstrual cycle.   PAST SURGICAL HISTORY:  Foot surgery.   SOCIAL HISTORY:  She smokes a pack of cigarettes a day x 30 years.  She  occasionally drinks ethanol, although her history suggests heavier use.  She  denies any illicit drug use.   FAMILY HISTORY:  Father is deceased secondary to lung cancer.  Mother is  deceased secondary to coronary artery disease.   ALLERGIES:  CELEBREX, which caused vomiting.   MEDICATIONS:  1.  Wellbutrin XL 300 q.a.m.  2.  Enalapril 20 mg p.o. q.a.m.  3.  Hydrochlorothiazide 25 mg p.o. q.a.m.  4.  Provigil 200 mg p.o. q.a.m.  5.  Hydrocodone p.r.n.  6.  Lantus 15 units at bedtime.  7.  Actos 10 mg q.a.m.  8.  Lipitor 10 mg at bedtime.  9.  Celexa 20 mg q.a.m.  10. Humulin sliding scale insulin.   REVIEW OF SYSTEMS:  At this time, she is unable to complete a full review of  systems, as she is postoperative and slightly groggy, but she is complaining  of some nausea and some vomiting.  Denies any pain at this time.   PHYSICAL EXAMINATION:  VITAL SIGNS:  Temperature 98.1; blood pressure  126/62; pulse 80; respirations 16; saturation 96%; CBG 365.  GENERAL:  [name] groggy.  [name] in no acute distress.  HEENT:  [name] normocephalic, atraumatic.  Pupils equal, round, and reactive  to light.  Extraocular muscles are intact.  She has mild periorbital edema.  Mucous membranes are moist.  NECK:  In a soft collar and cannot be examined.  CHEST:  Shows decreased breath sounds bilaterally, but no rhonchi, rales, or  wheezes.  CARDIOVASCULAR:  Regular rate and rhythm.  Positive S1, S2.  No S3 or S4.  No murmurs, rubs, or gallops.  ABDOMEN:  Soft, nontender, nondistended, with positive bowel sounds.  EXTREMITIES:  Show small feet, with bilateral scars well healed.  She has 2+  pulses and no edema.  NEUROLOGIC:  [name] awake, alert, oriented x 3, but groggy.  Cranial nerves  II-XII are intact.  Power is 5/5.  DTRs are 2+.   LABORATORIES:  White count 15.2, hemoglobin 15.2, hematocrit 43.7, platelets  309.  Sodium 140, potassium 3.9, chloride 100, CO2 30, BUN 14, creatinine  0.9, blood sugar is 40.  PTT 25, PT 11.4, INR 0.8.   ASSESSMENT AND PLAN:  This is a 50 year old white female with hypertension,  diabetes, status post anterior cervical fusion.  Currently blood pressure  stable.  Her blood sugar is slightly elevated because of D5 IV fluid, as the  patient is n.p.o.  1.  Diabetes.  At this time, we will adjust down her Lantus slightly.  Cover      her with sliding scale insulin, and continue her on the D5-half normal      saline.  If her blood sugars continue to rise, we will change her to       normal saline, but at this time she is not able to eat.  Will check a      hemoglobin A1c if necessary.  2.  Hypertension.  The patient took her morning enalapril and      hydrochlorothiazide.  At this time, her blood pressure is stable.  We      will continue to monitor and restart her medications in the morning,      with hold parameters.  3.  GI.  She will be symptomatically treated for nausea.  4.  GU.  Her UA is negative.  5.  ID.  Antibiotics will be as per orthopedics surrounding her surgery.   Thank you for this consult.  We will follow along with you.      MLT/MEDQ  D:  02/06/2005  T:  02/06/2005  Job:  [id number]   cc:   Maurice March, M.D.  [address] [place]  [place] [address]  Fax: [phone]

## 2011-02-08 NOTE — H&P (Signed)
[name], [name]                ACCOUNT NO.:  [id number]   MEDICAL RECORD NO.:  [id number]          PATIENT TYPE:  INP   LOCATION:  4729                         FACILITY:  [facility]   PHYSICIAN:  Marcellus Scott, MD     DATE OF BIRTH:  [birth year]   DATE OF ADMISSION:  12/03/2006  DATE OF DISCHARGE:                              HISTORY & PHYSICAL   STAT HISTORY AND PHYSICAL   PRIMARY CARE PHYSICIAN:  Dr. Dow Adolph of [facility].   CHIEF COMPLAINT:  Chest pain.   HISTORY OF PRESENTING ILLNESS:  [name] is a pleasant, 50 year old,  Caucasian female patient with past medical history as indicated below.  She was in her usual state of health until approximately 7 p.m. tonight.  The patient said she had eaten her dinner half an hour prior and was on  the toilet bowl when she experienced a subacute onset of lower  sternal/epigastric chest pain, 10/10, stabbing in nature with radiation  to the back with associated numbness of the right arm.  Also associated  with worsening of chest pain with deep breathing.  She also complains of  feeling slightly dyspneic.  There was no diaphoresis, sense of doom,  nausea, or vomiting.  The patient subsequently went and lay down on her  bed; however, the symptoms continued to get worse when 911 was called,  and the patient presented to the emergency room.  On route, she received  oxygen, sublingual nitroglycerin, with some relief.  However, in about  approximately 55 minutes to an hour after the symptoms had started and  following receiving the morphine in the emergency room, patient says her  pain has almost resolved.   PAST MEDICAL HISTORY:  1. Type 2 diabetes.  2. Hypertension.  3. Dyslipidemia.  4. Heart murmur.  5. Questionable mild MI in November of 2007; however, the patient says      that there was no admit to the hospital.   PSYCHIATRIC:  1. Chronic depression.  2. Anxiety.   PAST SURGICAL HISTORY:  1. 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  2. Bilateral multiple feet surgeries.  3. Right herniorrhaphy.  4. Tonsillectomy.  5. Neck surgery.   ALLERGIES:  CELEBREX.   CURRENT MEDICATIONS:  1. Vicodin 7.5/500 one to two tablets q.6-8h p.r.n.  2. Humalog sliding scale insulin.  3. Fluticasone 50 mcg per spray, two sprays in each nostril daily.  4. Enalapril 20 mg p.o. daily.  5. Actos 30 mg p.o. q.h.s.  6. Lipitor 10 mg p.o. q.h.s.  7. Hydroxyzine 50 mg p.o. q.h.s.  8. Celexa 20 mg p.o. q.h.s.  9. Bupropion-XL 300 mg p.o. daily.  10.Lantus 40 units subcu in the morning.   FAMILY HISTORY:  1. Mother died at age 29 of a myocardial infarction.  2. Father died at age 72 of lung cancer.  3. Grandfather died at age 52.  He had diabetes and history of MI.  4. Grandmother died at the age of 60 of a myocardial infarction.   SOCIAL HISTORY:  The patient is single, on disability.  She lives with  her two daughters.  She says she was a heavy smoker and quit 10 months  ago.  She was also a heavy alcohol consumer, again quit 2 years and 8  months ago, and she also abused crack cocaine, which she claims she quit  2 years and 8 months ago.   ADVANCED DIRECTIVES:  The patient is a full code.   REVIEW OF SYSTEMS:  Over 10 systems reviewed and noncontributory.   PHYSICAL EXAMINATION:  GENERAL:  [name] is a moderately built and  obese female patient in no obvious cardiopulmonary distress or painful  distress at this time.  VITAL SIGNS:  Temperature is 97 degrees Fahrenheit, blood pressure is  118/62, pulse is 90 per minute, respirations 20 per minute, saturation  at 98% on room air.  HEAD, EYES, ENT:  Pupils are equally reacting to light and  accommodation.  There is no oropharyngeal erythema.  NECK:  No JVD, carotid bruit, lymphadenopathy, or goiter.  Supple.  RESPIRATORY SYSTEM:  Clear to auscultation bilaterally.  CARDIOVASCULAR SYSTEM:  First and second heart sounds  heard, no third or  fourth heart sounds, no murmurs, rubs, gallops, or clicks.  ABDOMEN:  Obese, nontender, no organomegaly or mass appreciated, bowel  sounds are present.  CENTRAL NERVOUS SYSTEM:  The patient is awake, alert, oriented x3 with  no focal neurological deficits.  EXTREMITIES:  Chronic deformities of both feet and toes.  The right  lower extremity seems shorter than the left.  There is no cyanosis,  clubbing, or edema.  Peripheral pulses are symmetrically felt.  SKIN:  Without any rashes.   LABORATORY DATA:  CBC:  Hemoglobin 12.5, hematocrit 36.7, white cell of  7.1, platelets at 329.  Basic metabolic panel with sodium of 130,  potassium 3.8, chloride 104, bicarb of 24.6, glucose 241, BUN 13,  creatinine 0.9.  Point-of-care cardiac markers x1 is negative.  A chest  x-ray has been viewed by me and reported as chronic lung disease with no  acute changes.  An EKG, which is normal sinus rhythm at 87 beats per  minute with normal axis, Q-waves in the inferior leads, which look new  compared to the patient's previous EKG on the 21st of September 2007.   ASSESSMENT AND PLAN:  1. Chest pain, rule out acute coronary syndrome.  The patient has all      the risk factors for heart disease including obesity, diabetes,      smoking and a family history.  The differential diagnoses are      gastric/musculoskeletal in origin for this chest pain.  Will admit      patient to telemetry.  Will check patient's D-dimer and cycle      cardiac enzymes.  We are going to place patient on aspirin, low      dose beta blockers, oxygen, nitroglycerin p.r.n.  If there is any      abnormality of her blood tests or new electrocardiogram changes or      recurrent chest pain to consider a Cardiology consultation.  2. Uncontrolled diabetes.  To continue with the patient's Lantus,      Actos, and a sliding scale insulin and to check her hemoglobin A1c. 3. Hypertension, which is at this time controlled.  To  continue her      Enalapril.  4. Dyslipidemia.  Check her fasting lipids and continue Lipitor.  5. Psychiatric.  To continue the patient on home medications of Celexa  and Bupropion.  The patient has no delusions, hallucinations,      suicidal or homicidal ideations and is stable at this time.  6. History of tobacco, alcohol, and substance abuse.  Will check a      urine toxicology screen and though she claims she has quit all of      these, to obtain a tobacco cessation and substance abuse      counseling.      Marcellus Scott, MD  Electronically Signed     AH/MEDQ  D:  12/04/2006  T:  12/04/2006  Job:  [id number]   cc:   Maurice March, M.D.
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
## 2011-02-08 NOTE — Discharge Summary (Signed)
[name], [name]                ACCOUNT NO.:  [id number]   MEDICAL RECORD NO.:  [id number]          PATIENT TYPE:  INP   LOCATION:  1615                         FACILITY:  [facility]   PHYSICIAN:  Deidre Ala, M.D.    DATE OF BIRTH:  11/12/1960   DATE OF ADMISSION:  08/16/2008  DATE OF DISCHARGE:  08/20/2008                               DISCHARGE SUMMARY   FINAL DIAGNOSES:  1. End-stage degenerative joint disease, left knee.  2. Hypertension.  3. Diabetes mellitus.  4. Gastroesophageal reflux disease.  5. Hypercholesterolemia.  6. Postoperative blood loss anemia.  7. Hyperosmolality.  8. Hypopotassemia.  9. Candida vulvovaginitis.  10.Anxiety.   HISTORY:  This is a 50 year old Caucasian female who was followed by Dr.  Renae Fickle for chronic knee pain.  [name] coming to the point where she has  failed conservative management and is ready for a total knee  replacement.  She was subsequently scheduled for thisand admitted to  [facility]   HOSPITAL COURSE:  On August 16, 2008, Mrs. Wynia was admitted to  [facility].  There she underwent left total knee arthroplasty.  The patient tolerated the procedure well, no intraoperative  complications occurred.  Postoperatively, the patient did well.  She had  postoperative blood loss anemia.  This was followed throughout her stay.  She was also seen by Dr. __________, her regular physician for following  up for her diabetes, which was controlled by the time of discharge.  She  did require blood transfusion.  She was given 2 units of packed red  cells on August 19, 2008, after her hemoglobin was 7.9.  The following  morning, she was ready for discharge.  She was doing well with physical  therapy, was walking without assistance at the time of discharge.  No  other untoward events occurred during her stay.  At the time of  discharge, her medications were:  1. Prilosec 40 mg b.i.d.  2. Wellbutrin XL 500 mg q. a.m.  3.  Hydroxyzine 50 mg daily.  4. Lantus 40 units q.a.m.  5. Enalapril 20 mg daily.  6. Citalopram 40 mg at h.s.  7. Lipitor 10 mg at bedtime.  8. Humalog insulin sliding scale.  9. Xanax 0.25 mg b.i.d.  10.Actos 30 mg h.s.  11.Celexa 20 mg q.a.m.  12.Hydrochlorothiazide 25 mg q.a.m.  13.Reglan 10 mg t.i.d.  14.She was given Percocet 5/325 one to 2 p.o. q.4-6 h. p.r.n. for      pain.  15.Robaxin  750 one p.o. q.i.d. p.r.n. for muscle spasms.  16.She will continue on Lovenox 30 mg subcu q.12 hours for 10 days.   She will follow up with Dr. Renae Fickle in 10 days.  She was subsequently  discharged home in satisfactory, stable condition on August 20, 2008.      Phineas Semen, Wynonia Hazard, M.D.  Electronically Signed    CL/MEDQ  D:  09/06/2008  T:  09/06/2008  Job:  [id number]

## 2011-02-08 NOTE — Discharge Summary (Signed)
NAMEJANCIE, KERCHER                ACCOUNT NO.:  [id number]   MEDICAL RECORD NO.:  [id number]          PATIENT TYPE:  INP   LOCATION:  4729                         FACILITY:  [facility]   PHYSICIAN:  Beckey Rutter, MD  DATE OF BIRTH:  June 06, 1961   DATE OF ADMISSION:  12/04/2006  DATE OF DISCHARGE:  12/07/2006                               DISCHARGE SUMMARY   PRIMARY CARE PHYSICIAN:  Dr. Audria Nine.   CHIEF COMPLAINT UPON ADMISSION:  Epigastric pain and chest tightness  together with shortness of breath.   For full H&P, please refer to the H&P dictated on the date of admission  by Dr. Marcellus Scott.   DISCHARGE DIAGNOSES:  1. Chest pain, rule out acute coronary syndrome/myocardial infarction.  2. Diabetes, Type 2.  3. Hypertension.  4. Dyslipidemia.  5. History of myocardial infarction in November 2007   SECONDARY DIAGNOSES/>  1. Chronic depression.  2. Anxiety.   DISCHARGE MEDICATIONS:  1. Albuterol metered-dose inhaler.  2. Aspirin 81 mg.  3. Wellbutrin 300 mg p.o. daily.  4. Celexa 20 mg p.o. daily.  5. Vasotec 20 mg daily.  6. Insulin Lantus 15 units at night.  7. Synthroid 100 mcg p.o. daily.  8. Lopressor 12.5 mg p.o. b.i.d.  9. Actos 30 mg p.o. daily.  10.Nitroglycerin 0.4 mg p.o. daily.   HOSPITAL COURSE:  1. Ms. Madkins is 50 year old Caucasian who came with chest pain.      Patient was admitted and evaluated by [facility].      Patient underwent stress test with Myoview Adenosine.  The results,      as per the cardiologist, is showing a small focus of nonreversible      decrease perfusion inferolateral wall and left ventricular on      rest/stress images.  In other words, a small scar.  Normal left      ventricular ejection fraction at 71%.  Normal left ventricular wall      motion.  So the patient has been stable during her hospitalization      here and the recommendation is to follow up with the Cardiology,      Dr. Juanda Chance with the address  provided to her with the discharge      instructions.  2. Patient was found to have elevated TSH at 29.75 with a normal range      and/or lab 0.350/5.500.  The free T4 is also decreased, but not as      much as the increase in the TSH where the number is 0.78 and the      normal range and/or lab is 0.89 to 1.80.  With that though the      patient does not have severe symptoms of hypothyroidism, but with      this chest pain a decision was made to start her on Synthroid.  She      is going to have 100 mcg of Synthroid.  Patient's thyroid status      was discussed with her and she was told the plan is to follow up  with her primary for further adjustment and decision regarding this      issue.  3. The rest of her medical problem including diabetes, hypertension      were stable during hospitalization.      Beckey Rutter, MD  Electronically Signed     EME/MEDQ  D:  12/07/2006  T:  12/07/2006  Job:  [id number]
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
## 2011-02-08 NOTE — Discharge Summary (Signed)
NAMEJAVONNA, BALLI                ACCOUNT NO.:  [id number]   MEDICAL RECORD NO.:  [id number]          PATIENT TYPE:  INP   LOCATION:  5156                         FACILITY:  [facility]   PHYSICIAN:  Nelda Severe, MD      DATE OF BIRTH:  May 06, 1961   DATE OF ADMISSION:  02/18/2008  DATE OF DISCHARGE:  02/22/2008                               DISCHARGE SUMMARY   Ms. Basquez was admitted to [facility] secondary to right lower  extremity swelling, edema - pitting, pain with weightbearing activities.  We are ruling out iliac DVT.  She was admitted to 5001 for workup,  started on Lovenox per pharmacy, and also Coumadin per pharmacy to  sustain INR between 2.0 and 3.0.  On admission, her basic metabolic  panel was within normal limits.  Her glucose was 159.  [name] a  diabetic.  Cardiac markers were drawn as well and were within normal  limits.  The Lovenox was dosed at 105 q.12 h. subcu, and a CT venogram  was ordered and pending.  Right lower extremity swelling did go down  throughout the course of her stay, and she was discharged home with  Coumadin 7.5 mg.  We ordered home health for PT/INR draws.  She was  discharged on February 22, 2008, with a diagnosis of right iliac DVT not  recognized on CT.  Her INR was 1.1 on discharge.  She was also  discharged on 105 b.i.d. subcu dosing of Lovenox until the INR has  reached at 2-3 through home health draws, and she will follow up.   PLAN:  Plan is to follow up with Dr. Nelda Severe in 1-2 weeks.  Weightbearing as tolerated.  [name] going to continue on her medications  that she was admitted with.   DISPOSITION:  Stable.   DIET:  Low carb modified.   DISCHARGE DIAGNOSIS:  Right iliac deep venous thrombosis.      Lianne Cure, P.A.      Nelda Severe, MD  Electronically Signed    MC/MEDQ  D:  04/19/2008  T:  04/19/2008  Job:  [id number]

## 2011-02-08 NOTE — Discharge Summary (Signed)
[name], [name]                ACCOUNT NO.:  [id number]   MEDICAL RECORD NO.:  [id number]          PATIENT TYPE:  INP   LOCATION:  1516                         FACILITY:  [facility]   PHYSICIAN:  Deidre Ala, M.D.    DATE OF BIRTH:  1961-01-18   DATE OF ADMISSION:  06/17/2006  DATE OF DISCHARGE:  06/20/2006                                 DISCHARGE SUMMARY   ADMISSION DIAGNOSES:  1. End-stage osteoarthritis of the left shoulder.  2. Diabetes mellitus.  3. Hypertension.  4. Hypercholesterolemia.   DISCHARGE DIAGNOSES:  1. End-stage osteoarthritis of the left shoulder status post left shoulder      hemiarthroplasty.  2. Diabetes mellitus.  3. Hypertension.  4. Hypercholesterolemia.  5. Postoperative hemorrhagic anemia, stable at the time of discharge.   OPERATION PERFORMED:  The patient was taken to the operating room on  June 17, 2006 and underwent a left shoulder hemiarthroplasty, DePuy  type, with surgeon Deidre Ala, M.D. and assistance [place], [place]. A.-  C.  The surgery was performed under general anesthesia with a interscalene  block.   CONSULTATIONS:  1. Physical therapy.  2. Occupational therapy.  3. Social worker case management.   BRIEF HISTORY:  This patient is a 50 year old female with a longstanding  history of left shoulder pain.  Radiographs in the office revealed end-stage  osteoarthritis of the left shoulder.  Due to these findings Dr. Renae Fickle felt it  would be best to proceed with left shoulder hemiarthroplasty.  The patient  agreed.  The risks and benefits, and the surgery were discussed with the  patient.  The patient wishes to proceed as noted above.  Marland Kitchen   LABORATORY DATA:  CBC on admission showed a hemoglobin of 13.3, hematocrit  38.2, white blood cell count 6.4, and red blood cell count 4.37.  Serial  H&Hs were followed throughout the hospital stay.  The hemoglobin and  hematocrit did climb to 9.9 and 28.0 respectively, and not 27, but  were  stable at the time of discharge.  Differential on admission was all within  normal limits.  Coagulation studies on admission were all within normal  limits.  Routine chemistries on admission showed glucose high at 237.  Serial chemistries were followed throughout the hospital stay; the sodium  did climb to 128 on June 18, 2006, but it was on the rise, and rose to  131 the following day.  Potassium fell to 3.2 on June 19, 2006, glucose  ranged from a high of 237 on admission to a low of 169.  Urinalysis on  admission showed a small amount of leukocyte esterase.   EKG on admission showed normal sinus rhythm with leftward axis. Preoperative  x-ray showed stale radiographic appearance of the chest.   HOSPITAL COURSE:  The patient was admitted to [facility]  and was  taken to the operating room where she underwent the above-stated procedure  without complications.  The patient tolerated the procedure well and was  taken to the recovery room for orthopedic recovery room or continued  postoperative care.   On  postoperative day number one the patient was resting comfortably.  The  hemoglobin and hematocrit  were 10.7 and 30.1 respectively.  She was never  asked for any tactile left upper extremity.  She worked with physical  therapy and occupational therapy with range of motion as tolerated.   On June 19, 2006, postoperative day number two the patient had a rough  night the previous night.  She did not feel her pain was controlled well  enough  to be discharged home. Hemoglobin and hematocrit were 9.9 and 28.0.  She never asked for tactile left upper extremity.  The incision was clean,  dry and intact.  The patient  is to continue to work with physical therapy  and occupational therapy.  The patient's PCA was discontinued and pain  control was monitored.   On June 20, 2006, postoperative day number three the patient was  resting comfortably and was read or  discharge.  She was neurovascularly  intact to the left upper extremity.  The incision was clean, dry and intact;  and, the patient was discharged home on this date   DISPOSITION:  The patient is to be discharged home on June 20, 2006.   DIET:  1. Previous home diabetic diet.  2. The patient has a range of motion that is tolerated to left upper      extremity.   WOUND CARE:  The patient is to perform daily dressing changes until she has  no drainage.   FOLLOW UP:  The patient will follow up with Dr. Renae Fickle two weeks from the date  of surgery; the office is to be called for an appointment at [phone].   DISCHARGE CONDITION:  The patient's condition on discharge is stable and  improved.     ______________________________  Clarene Reamer, P.A.-C.    ______________________________  V. Charlesetta Shanks, M.D.    SW/MEDQ  D:  06/20/2006  T:  06/22/2006  Job:  [id number]

## 2011-05-30 ENCOUNTER — Emergency Department (HOSPITAL_COMMUNITY)
Admission: EM | Admit: 2011-05-30 | Discharge: 2011-05-30 | Disposition: A | Discharge status: Home / Self Care | Payer: Medicaid Other | Attending: Emergency Medicine | Admitting: Emergency Medicine

## 2011-05-30 DIAGNOSIS — E119 Type 2 diabetes mellitus without complications: Secondary | ICD-10-CM | POA: Insufficient documentation

## 2011-05-30 DIAGNOSIS — F329 Major depressive disorder, single episode, unspecified: Secondary | ICD-10-CM | POA: Insufficient documentation

## 2011-05-30 DIAGNOSIS — Z79899 Other long term (current) drug therapy: Secondary | ICD-10-CM | POA: Insufficient documentation

## 2011-05-30 DIAGNOSIS — L259 Unspecified contact dermatitis, unspecified cause: Secondary | ICD-10-CM | POA: Insufficient documentation

## 2011-05-30 DIAGNOSIS — F3289 Other specified depressive episodes: Secondary | ICD-10-CM | POA: Insufficient documentation

## 2011-05-30 DIAGNOSIS — L299 Pruritus, unspecified: Secondary | ICD-10-CM | POA: Insufficient documentation

## 2011-05-30 DIAGNOSIS — E7800 Pure hypercholesterolemia, unspecified: Secondary | ICD-10-CM | POA: Insufficient documentation

## 2011-05-30 DIAGNOSIS — I10 Essential (primary) hypertension: Secondary | ICD-10-CM | POA: Insufficient documentation

## 2011-06-12 LAB — BASIC METABOLIC PANEL
BUN: 16
BUN: 19
BUN: 3 — ABNORMAL LOW
BUN: 3 — ABNORMAL LOW
BUN: 5 — ABNORMAL LOW
CO2: 27
CO2: 29
CO2: 29
CO2: 31
CO2: 31
CO2: 32
CO2: 32
CO2: 34 — ABNORMAL HIGH
Calcium: 7.6 — ABNORMAL LOW
Calcium: 7.8 — ABNORMAL LOW
Calcium: 8 — ABNORMAL LOW
Calcium: 8 — ABNORMAL LOW
Calcium: 8.3 — ABNORMAL LOW
Calcium: 8.5
Calcium: 8.6
Chloride: 100
Chloride: 101
Chloride: 107
Chloride: 93 — ABNORMAL LOW
Chloride: 96
Chloride: 96
Chloride: 96
Creatinine, Ser: 0.6
Creatinine, Ser: 0.64
Creatinine, Ser: 0.66
Creatinine, Ser: 0.69
Creatinine, Ser: 0.81
GFR calc Af Amer: >60
GFR calc Af Amer: >60
GFR calc Af Amer: >60
GFR calc Af Amer: >60
GFR calc Af Amer: >60
GFR calc Af Amer: >60
GFR calc Af Amer: >60
GFR calc non Af Amer: 56 — ABNORMAL LOW
GFR calc non Af Amer: 57 — ABNORMAL LOW
GFR calc non Af Amer: >60
GFR calc non Af Amer: >60
Glucose, Bld: 163 — ABNORMAL HIGH
Glucose, Bld: 205 — ABNORMAL HIGH
Glucose, Bld: 291 — ABNORMAL HIGH
Glucose, Bld: 63 — ABNORMAL LOW
Glucose, Bld: 95
Potassium: 3.2 — ABNORMAL LOW
Potassium: 3.3 — ABNORMAL LOW
Potassium: 3.6
Potassium: 4.1
Potassium: 4.3
Potassium: 4.8
Sodium: 128 — ABNORMAL LOW
Sodium: 131 — ABNORMAL LOW
Sodium: 131 — ABNORMAL LOW
Sodium: 134 — ABNORMAL LOW
Sodium: 136
Sodium: 137
Sodium: 138
Sodium: 140

## 2011-06-12 LAB — CROSSMATCH: Antibody Screen: NEGATIVE

## 2011-06-12 LAB — CBC
HCT: 24.7 — ABNORMAL LOW
HCT: 24.8 — ABNORMAL LOW
HCT: 25.2 — ABNORMAL LOW
HCT: 25.4 — ABNORMAL LOW
HCT: 25.7 — ABNORMAL LOW
HCT: 33.6 — ABNORMAL LOW
Hemoglobin: 10.1 — ABNORMAL LOW
Hemoglobin: 11.5 — ABNORMAL LOW
Hemoglobin: 8.4 — ABNORMAL LOW
Hemoglobin: 8.4 — ABNORMAL LOW
Hemoglobin: 8.6 — ABNORMAL LOW
Hemoglobin: 8.6 — ABNORMAL LOW
MCHC: 33.7
MCHC: 33.8
MCHC: 33.9
MCHC: 33.9
MCHC: 34.2
MCHC: 34.6
MCV: 85
MCV: 85.6
MCV: 85.8
MCV: 87.5
MCV: 88.3
Platelets: 177
Platelets: 207
Platelets: 237
Platelets: 413 — ABNORMAL HIGH
Platelets: 471 — ABNORMAL HIGH
RBC: 2.88 — ABNORMAL LOW
RBC: 2.89 — ABNORMAL LOW
RBC: 2.91 — ABNORMAL LOW
RBC: 3.01 — ABNORMAL LOW
RBC: 3.29 — ABNORMAL LOW
RBC: 3.35 — ABNORMAL LOW
RBC: 3.36 — ABNORMAL LOW
RBC: 3.8 — ABNORMAL LOW
RDW: 14.1
RDW: 14.3
RDW: 14.3
WBC: 5.4
WBC: 5.9
WBC: 6.2
WBC: 6.7
WBC: 6.8
WBC: 7
WBC: 8.4

## 2011-06-12 LAB — CULTURE, ROUTINE-ABSCESS
Culture: NO GROWTH
Culture: NO GROWTH

## 2011-06-12 LAB — ANAEROBIC CULTURE

## 2011-06-12 LAB — HEMOGLOBIN AND HEMATOCRIT, BLOOD: HCT: 21.9 — ABNORMAL LOW

## 2011-06-17 LAB — CBC
HCT: 26.3 — ABNORMAL LOW
HCT: 28.6 — ABNORMAL LOW
HCT: 28.8 — ABNORMAL LOW
HCT: 33 — ABNORMAL LOW
HCT: 38.2
Hemoglobin: 9 — ABNORMAL LOW
Hemoglobin: 9.7 — ABNORMAL LOW
Hemoglobin: 9.7 — ABNORMAL LOW
Hemoglobin: 9.8 — ABNORMAL LOW
MCHC: 33.9
MCHC: 33.9
MCV: 85.2
MCV: 85.2
MCV: 85.3
MCV: 85.4
Platelets: 222
Platelets: 247
Platelets: 259
Platelets: 276
RBC: 3.05 — ABNORMAL LOW
RBC: 3.2 — ABNORMAL LOW
RBC: 3.36 — ABNORMAL LOW
RDW: 14.8
RDW: 15.2
RDW: 15.3
RDW: 15.3
RDW: 15.5
WBC: 4.8
WBC: 5.2
WBC: 5.9
WBC: 6.3

## 2011-06-17 LAB — VITAMIN D 25 HYDROXY (VIT D DEFICIENCY, FRACTURES): Vit D, 25-Hydroxy: 7 — ABNORMAL LOW (ref 30–89)

## 2011-06-17 LAB — URINALYSIS, ROUTINE W REFLEX MICROSCOPIC
Bilirubin Urine: NEGATIVE
Ketones, ur: 40 — AB
Nitrite: NEGATIVE
Urobilinogen, UA: 0.2

## 2011-06-17 LAB — BASIC METABOLIC PANEL
BUN: 13
BUN: 16
BUN: 3 — ABNORMAL LOW
CO2: 33 — ABNORMAL HIGH
CO2: 34 — ABNORMAL HIGH
Calcium: 8.4
Calcium: 8.4
Calcium: 8.8
Chloride: 92 — ABNORMAL LOW
Chloride: 93 — ABNORMAL LOW
Creatinine, Ser: 0.61
Creatinine, Ser: 0.85
Creatinine, Ser: 0.88
GFR calc Af Amer: >60
GFR calc Af Amer: >60
GFR calc Af Amer: >60
GFR calc Af Amer: >60
GFR calc non Af Amer: 47 — ABNORMAL LOW
GFR calc non Af Amer: >60
GFR calc non Af Amer: >60
GFR calc non Af Amer: >60
Glucose, Bld: 130 — ABNORMAL HIGH
Glucose, Bld: 172 — ABNORMAL HIGH
Glucose, Bld: 220 — ABNORMAL HIGH
Glucose, Bld: 284 — ABNORMAL HIGH
Glucose, Bld: 310 — ABNORMAL HIGH
Potassium: 3.7
Potassium: 3.8
Potassium: 4.2
Sodium: 133 — ABNORMAL LOW
Sodium: 136
Sodium: 136
Sodium: 136
Sodium: 137

## 2011-06-17 LAB — COMPREHENSIVE METABOLIC PANEL
Albumin: 4
BUN: 11
Calcium: 9.3
Chloride: 95 — ABNORMAL LOW
Creatinine, Ser: 0.76
GFR calc non Af Amer: >60
Total Bilirubin: 1.1

## 2011-06-17 LAB — DIFFERENTIAL
Basophils Absolute: 0
Lymphocytes Relative: 24
Monocytes Absolute: 0.3
Neutro Abs: 3.6

## 2011-06-17 LAB — TYPE AND SCREEN
ABO/RH(D): B POS
Antibody Screen: NEGATIVE

## 2011-06-17 LAB — PROTIME-INR: Prothrombin Time: 12.7

## 2011-06-17 LAB — URINE CULTURE: Colony Count: 70000

## 2011-06-17 LAB — APTT: aPTT: 30

## 2011-06-18 LAB — BASIC METABOLIC PANEL
BUN: 6
CO2: 27
CO2: 29
CO2: 30
Chloride: 100
GFR calc Af Amer: >60
GFR calc non Af Amer: >60
GFR calc non Af Amer: >60
Glucose, Bld: 110 — ABNORMAL HIGH
Glucose, Bld: 320 — ABNORMAL HIGH
Glucose, Bld: 368 — ABNORMAL HIGH
Potassium: 4.2
Potassium: 5
Potassium: 5.3 — ABNORMAL HIGH
Sodium: 127 — ABNORMAL LOW
Sodium: 134 — ABNORMAL LOW
Sodium: 136

## 2011-06-18 LAB — POCT I-STAT, CHEM 8
Chloride: 89 — ABNORMAL LOW
Glucose, Bld: 73
HCT: 33 — ABNORMAL LOW
Potassium: 3.1 — ABNORMAL LOW

## 2011-06-18 LAB — URINALYSIS, ROUTINE W REFLEX MICROSCOPIC
Nitrite: NEGATIVE
Specific Gravity, Urine: 1.019
pH: 5

## 2011-06-18 LAB — POCT CARDIAC MARKERS
CKMB, poc: 1.2
Troponin i, poc: <0.05

## 2011-06-18 LAB — DIFFERENTIAL
Basophils Absolute: 0
Eosinophils Relative: 2
Lymphocytes Relative: 27
Monocytes Absolute: 0.5

## 2011-06-18 LAB — CBC
HCT: 30.6 — ABNORMAL LOW
HCT: 31.4 — ABNORMAL LOW
Hemoglobin: 10.3 — ABNORMAL LOW
Hemoglobin: 10.6 — ABNORMAL LOW
RBC: 3.74 — ABNORMAL LOW
RDW: 14.3
RDW: 14.5

## 2011-06-18 LAB — URINE MICROSCOPIC-ADD ON

## 2011-06-18 LAB — CARDIAC PANEL(CRET KIN+CKTOT+MB+TROPI)
CK, MB: 4
CK, MB: 6.2 — ABNORMAL HIGH
Relative Index: 3.7 — ABNORMAL HIGH
Relative Index: INVALID
Total CK: 109
Total CK: 94

## 2011-06-18 LAB — POCT PREGNANCY, URINE: Operator id: 277751

## 2011-06-19 LAB — POCT CARDIAC MARKERS
CKMB, poc: 3.3
Myoglobin, poc: 87.4
Operator id: 295021
Troponin i, poc: <0.05

## 2011-06-19 LAB — PROTIME-INR
INR: 1
INR: 1.1
Prothrombin Time: 13.2
Prothrombin Time: 13.9

## 2011-06-19 LAB — CBC
Hemoglobin: 11.5 — ABNORMAL LOW
MCV: 83.4
RBC: 3.98
WBC: 5.5

## 2011-06-19 LAB — POCT I-STAT, CHEM 8
BUN: 16
Calcium, Ion: 1.15
Chloride: 97
Creatinine, Ser: 1.3 — ABNORMAL HIGH
Glucose, Bld: 159 — ABNORMAL HIGH
HCT: 37
Hemoglobin: 12.6
Potassium: 3.4 — ABNORMAL LOW
Sodium: 134 — ABNORMAL LOW
TCO2: 28

## 2011-06-19 LAB — D-DIMER, QUANTITATIVE: D-Dimer, Quant: 1.59 — ABNORMAL HIGH

## 2011-06-19 LAB — APTT: aPTT: 32

## 2011-06-20 LAB — POCT CARDIAC MARKERS: Troponin i, poc: 0.11 — ABNORMAL HIGH

## 2011-06-20 LAB — CBC
HCT: 30 — ABNORMAL LOW
HCT: 35.4 — ABNORMAL LOW
HCT: 39
Hemoglobin: 10.2 — ABNORMAL LOW
Hemoglobin: 12
Hemoglobin: 13
MCHC: 34.1
MCV: 82
MCV: 82.7
RBC: 3.6 — ABNORMAL LOW
RDW: 14.9
WBC: 6.4

## 2011-06-20 LAB — BASIC METABOLIC PANEL
BUN: 9
Calcium: 9.5
Creatinine, Ser: 0.74
GFR calc non Af Amer: >60
Glucose, Bld: 211 — ABNORMAL HIGH
Potassium: 4.1

## 2011-06-20 LAB — DIFFERENTIAL
Eosinophils Absolute: 0.1
Eosinophils Relative: 2
Lymphocytes Relative: 28
Lymphs Abs: 1.8
Monocytes Absolute: 0.7

## 2011-06-20 LAB — CARDIAC PANEL(CRET KIN+CKTOT+MB+TROPI): CK, MB: 2.2

## 2011-06-20 LAB — TROPONIN I: Troponin I: <0.01

## 2011-06-20 LAB — CK TOTAL AND CKMB (NOT AT ARMC)
CK, MB: 2.3
Relative Index: 2
Total CK: 115

## 2011-06-20 LAB — PROTIME-INR: INR: 1.1

## 2011-06-24 LAB — GLUCOSE, CAPILLARY
Glucose-Capillary: 100 — ABNORMAL HIGH
Glucose-Capillary: 101 — ABNORMAL HIGH
Glucose-Capillary: 117 — ABNORMAL HIGH
Glucose-Capillary: 119 — ABNORMAL HIGH
Glucose-Capillary: 131 — ABNORMAL HIGH
Glucose-Capillary: 134 — ABNORMAL HIGH
Glucose-Capillary: 145 — ABNORMAL HIGH
Glucose-Capillary: 155 — ABNORMAL HIGH
Glucose-Capillary: 156 — ABNORMAL HIGH
Glucose-Capillary: 157 — ABNORMAL HIGH
Glucose-Capillary: 161 — ABNORMAL HIGH
Glucose-Capillary: 169 — ABNORMAL HIGH
Glucose-Capillary: 173 — ABNORMAL HIGH
Glucose-Capillary: 184 — ABNORMAL HIGH
Glucose-Capillary: 189 — ABNORMAL HIGH
Glucose-Capillary: 205 — ABNORMAL HIGH
Glucose-Capillary: 211 — ABNORMAL HIGH
Glucose-Capillary: 213 — ABNORMAL HIGH
Glucose-Capillary: 230 — ABNORMAL HIGH
Glucose-Capillary: 252 — ABNORMAL HIGH
Glucose-Capillary: 255 — ABNORMAL HIGH
Glucose-Capillary: 287 — ABNORMAL HIGH
Glucose-Capillary: 329 — ABNORMAL HIGH
Glucose-Capillary: 346 — ABNORMAL HIGH
Glucose-Capillary: 508
Glucose-Capillary: 85
Glucose-Capillary: 94
Glucose-Capillary: >600
Glucose-Capillary: >600
Glucose-Capillary: >600
Glucose-Capillary: >600
Glucose-Capillary: >600

## 2011-06-24 LAB — RAPID URINE DRUG SCREEN, HOSP PERFORMED
Amphetamines: NOT DETECTED
Cocaine: NOT DETECTED
Opiates: POSITIVE — AB
Tetrahydrocannabinol: NOT DETECTED

## 2011-06-24 LAB — URINALYSIS, ROUTINE W REFLEX MICROSCOPIC
Bilirubin Urine: NEGATIVE
Ketones, ur: >80 — AB
Ketones, ur: >80 — AB
Nitrite: NEGATIVE
Nitrite: NEGATIVE
Urobilinogen, UA: 0.2
pH: 5

## 2011-06-24 LAB — POCT CARDIAC MARKERS
CKMB, poc: 2.2
Myoglobin, poc: 111
Troponin i, poc: <0.05

## 2011-06-24 LAB — COMPREHENSIVE METABOLIC PANEL
ALT: 9
AST: 14
AST: 15
AST: 16
Albumin: 3.2 — ABNORMAL LOW
Albumin: 3.6
Albumin: 3.7
Alkaline Phosphatase: 115
Alkaline Phosphatase: 144 — ABNORMAL HIGH
BUN: 23
BUN: 7
CO2: 20
CO2: 30
Calcium: 9.7
Calcium: 8.4
Calcium: 9.1
Calcium: 9.3
Chloride: 102
Creatinine, Ser: 1.28 — ABNORMAL HIGH
Creatinine, Ser: 0.68
Creatinine, Ser: 1.16
GFR calc Af Amer: 54 — ABNORMAL LOW
GFR calc Af Amer: 30 — ABNORMAL LOW
GFR calc Af Amer: >60
GFR calc non Af Amer: 45 — ABNORMAL LOW
GFR calc non Af Amer: >60
GFR calc non Af Amer: >60
Glucose, Bld: 303 — ABNORMAL HIGH
Glucose, Bld: 195 — ABNORMAL HIGH
Glucose, Bld: 85
Glucose, Bld: 978
Potassium: 3.2 — ABNORMAL LOW
Potassium: 6.1 — ABNORMAL HIGH
Sodium: 126 — ABNORMAL LOW
Total Bilirubin: 1.6 — ABNORMAL HIGH
Total Protein: 5.6 — ABNORMAL LOW
Total Protein: 6
Total Protein: 6.1
Total Protein: 6.6

## 2011-06-24 LAB — PROTIME-INR
INR: 1
Prothrombin Time: 13.3
Prothrombin Time: 13.9

## 2011-06-24 LAB — BASIC METABOLIC PANEL
BUN: 4 — ABNORMAL LOW
BUN: 5 — ABNORMAL LOW
CO2: 21
CO2: 27
Calcium: 8.9
Calcium: 9
Calcium: 9
Chloride: 100
Chloride: 99
Creatinine, Ser: 0.54
Creatinine, Ser: 0.98
GFR calc Af Amer: >60
GFR calc Af Amer: >60
GFR calc non Af Amer: >60
GFR calc non Af Amer: >60
GFR calc non Af Amer: >60
Glucose, Bld: 144 — ABNORMAL HIGH
Glucose, Bld: 301 — ABNORMAL HIGH
Glucose, Bld: 368 — ABNORMAL HIGH
Potassium: 3.2 — ABNORMAL LOW
Potassium: 3.6
Sodium: 133 — ABNORMAL LOW
Sodium: 138
Sodium: 138

## 2011-06-24 LAB — DIFFERENTIAL
Basophils Relative: 0
Eosinophils Absolute: 0
Eosinophils Relative: 0
Lymphocytes Relative: 11 — ABNORMAL LOW
Lymphs Abs: 1.4
Lymphs Abs: 0.9
Monocytes Absolute: 0.7
Neutro Abs: 21.9 — ABNORMAL HIGH
Neutrophils Relative %: 88 — ABNORMAL HIGH

## 2011-06-24 LAB — MAGNESIUM
Magnesium: 2.2
Magnesium: 2.3

## 2011-06-24 LAB — CBC
HCT: 34.3 — ABNORMAL LOW
HCT: 37
Hemoglobin: 10.7 — ABNORMAL LOW
Hemoglobin: 11.5 — ABNORMAL LOW
Hemoglobin: 11.8 — ABNORMAL LOW
Hemoglobin: 12.5
MCHC: 33.4
MCHC: 33.3
MCHC: 33.5
MCHC: 33.9
MCHC: 34.3
MCV: 86.1
MCV: 86
Platelets: 238
Platelets: 315
Platelets: 403 — ABNORMAL HIGH
RBC: 4.8
RBC: 3.67 — ABNORMAL LOW
RBC: 3.9
RDW: 14.2
RDW: 14.4
RDW: 14.5
RDW: 14.9
RDW: 14.9
WBC: 7.8

## 2011-06-24 LAB — POCT I-STAT, CHEM 8
BUN: 32 — ABNORMAL HIGH
Calcium, Ion: 1.15
Chloride: 92 — ABNORMAL LOW
Potassium: 6.4

## 2011-06-24 LAB — URINE MICROSCOPIC-ADD ON

## 2011-06-24 LAB — FUNGUS CULTURE W SMEAR: Fungal Smear: NONE SEEN

## 2011-06-24 LAB — LACTIC ACID, PLASMA: Lactic Acid, Venous: 0.9

## 2011-06-24 LAB — AMMONIA: Ammonia: 12

## 2011-06-25 LAB — CBC
HCT: 26.6 — ABNORMAL LOW
HCT: 30.1 — ABNORMAL LOW
HCT: 34.8 — ABNORMAL LOW
Hemoglobin: 10.2 — ABNORMAL LOW
Hemoglobin: 11.6 — ABNORMAL LOW
Hemoglobin: 8.7 — ABNORMAL LOW
Hemoglobin: 9.1 — ABNORMAL LOW
MCHC: 33.2
MCHC: 34
MCHC: 34.2
MCHC: 34.5
MCV: 87.2
MCV: 87.4
MCV: 87.6
MCV: 88.5
Platelets: 242
Platelets: 297
RBC: 2.64 — ABNORMAL LOW
RBC: 2.87 — ABNORMAL LOW
RBC: 3.04 — ABNORMAL LOW
RBC: 3.4 — ABNORMAL LOW
RBC: 3.99
RDW: 13.9
RDW: 14
RDW: 14.3
WBC: 4.9
WBC: 5.8
WBC: 7.3

## 2011-06-25 LAB — GLUCOSE, CAPILLARY
Glucose-Capillary: 110 — ABNORMAL HIGH
Glucose-Capillary: 146 — ABNORMAL HIGH
Glucose-Capillary: 194 — ABNORMAL HIGH
Glucose-Capillary: 254 — ABNORMAL HIGH
Glucose-Capillary: 255 — ABNORMAL HIGH
Glucose-Capillary: 271 — ABNORMAL HIGH
Glucose-Capillary: 330 — ABNORMAL HIGH
Glucose-Capillary: 422 — ABNORMAL HIGH
Glucose-Capillary: 425 — ABNORMAL HIGH
Glucose-Capillary: 432 — ABNORMAL HIGH
Glucose-Capillary: 69 — ABNORMAL LOW
Glucose-Capillary: 96

## 2011-06-25 LAB — TYPE AND SCREEN
ABO/RH(D): B POS
Antibody Screen: NEGATIVE

## 2011-06-25 LAB — DIFFERENTIAL
Lymphocytes Relative: 26
Lymphs Abs: 1.5
Monocytes Relative: 6
Neutrophils Relative %: 65

## 2011-06-25 LAB — BASIC METABOLIC PANEL WITH GFR
BUN: 9
CO2: 29
Calcium: 8.3 — ABNORMAL LOW
Chloride: 91 — ABNORMAL LOW
Creatinine, Ser: 0.73
GFR calc Af Amer: >60
GFR calc non Af Amer: >60
Glucose, Bld: 403 — ABNORMAL HIGH
Potassium: 3.5
Sodium: 128 — ABNORMAL LOW

## 2011-06-25 LAB — COMPREHENSIVE METABOLIC PANEL WITH GFR
ALT: 8
AST: 12
Albumin: 3.5
Alkaline Phosphatase: 89
BUN: 14
CO2: 32
Calcium: 9.2
Chloride: 98
Creatinine, Ser: 0.69
GFR calc Af Amer: >60
GFR calc non Af Amer: >60
Glucose, Bld: 143 — ABNORMAL HIGH
Potassium: 3.3 — ABNORMAL LOW
Sodium: 136
Total Bilirubin: 0.5
Total Protein: 6.1

## 2011-06-25 LAB — URINALYSIS, ROUTINE W REFLEX MICROSCOPIC
Nitrite: NEGATIVE
Protein, ur: NEGATIVE
Specific Gravity, Urine: 1.014
Urobilinogen, UA: 0.2

## 2011-06-25 LAB — PROTIME-INR
INR: 1
Prothrombin Time: 12.9

## 2011-06-25 LAB — BASIC METABOLIC PANEL
CO2: 29
Calcium: 8.5
Chloride: 94 — ABNORMAL LOW
Creatinine, Ser: 0.74
GFR calc Af Amer: >60
GFR calc Af Amer: >60
GFR calc non Af Amer: >60
Sodium: 125 — ABNORMAL LOW

## 2011-06-25 LAB — URINE CULTURE
Colony Count: >=100000
Special Requests: NEGATIVE

## 2011-06-25 LAB — HEMOGLOBIN A1C: Mean Plasma Glucose: 157

## 2011-06-25 LAB — ABO/RH: ABO/RH(D): B POS

## 2011-06-28 LAB — CBC
HCT: 28.1 — ABNORMAL LOW
Hemoglobin: 12.6
Platelets: 214
RBC: 4.4
RDW: 14.1
WBC: 5.2

## 2011-06-28 LAB — URINE MICROSCOPIC-ADD ON

## 2011-06-28 LAB — COMPREHENSIVE METABOLIC PANEL
ALT: 11
AST: 17
Alkaline Phosphatase: 96
CO2: 30
GFR calc non Af Amer: >60
Glucose, Bld: 204 — ABNORMAL HIGH
Potassium: 3.6
Sodium: 136

## 2011-06-28 LAB — TYPE AND SCREEN: Antibody Screen: NEGATIVE

## 2011-06-28 LAB — URINALYSIS, ROUTINE W REFLEX MICROSCOPIC
Glucose, UA: NEGATIVE
Ketones, ur: NEGATIVE
Nitrite: POSITIVE — AB
Protein, ur: NEGATIVE
pH: 6

## 2011-06-28 LAB — BASIC METABOLIC PANEL
BUN: 16
Calcium: 7.3 — ABNORMAL LOW
Creatinine, Ser: 1.46 — ABNORMAL HIGH
GFR calc non Af Amer: 38 — ABNORMAL LOW
Glucose, Bld: 303 — ABNORMAL HIGH
Potassium: 4.2

## 2011-06-28 LAB — URINE CULTURE

## 2011-06-28 LAB — DIFFERENTIAL
Basophils Relative: 1
Eosinophils Absolute: 0.1
Eosinophils Relative: 1
Monocytes Relative: 7
Neutrophils Relative %: 63

## 2011-06-28 LAB — VITAMIN D 25 HYDROXY (VIT D DEFICIENCY, FRACTURES): Vit D, 25-Hydroxy: 10 — ABNORMAL LOW (ref 30–89)

## 2011-06-28 LAB — POCT I-STAT EG7
Calcium, Ion: 1.26
HCT: 25 — ABNORMAL LOW
Hemoglobin: 8.5 — ABNORMAL LOW
Operator id: 198871
Potassium: 3.7
Sodium: 141
pCO2, Ven: 48.1
pH, Ven: 7.353 — ABNORMAL HIGH

## 2011-06-28 LAB — HEMOGLOBIN A1C: Hgb A1c MFr Bld: 7 — ABNORMAL HIGH

## 2011-06-28 LAB — PROTIME-INR: INR: 0.9

## 2011-07-03 LAB — CBC
HCT: 40.5
Platelets: 317
RDW: 14.3 — ABNORMAL HIGH

## 2011-07-03 LAB — POCT I-STAT CREATININE: Operator id: 270651

## 2011-07-03 LAB — DIFFERENTIAL
Basophils Absolute: 0
Eosinophils Absolute: 0.1
Eosinophils Relative: 1
Lymphocytes Relative: 30
Neutrophils Relative %: 64

## 2011-07-03 LAB — I-STAT 8, (EC8 V) (CONVERTED LAB)
Chloride: 100
Glucose, Bld: 298 — ABNORMAL HIGH
Potassium: 3.7
pH, Ven: 7.46 — ABNORMAL HIGH

## 2011-07-03 LAB — D-DIMER, QUANTITATIVE: D-Dimer, Quant: 0.58 — ABNORMAL HIGH

## 2011-07-03 LAB — POCT CARDIAC MARKERS
CKMB, poc: <1 — ABNORMAL LOW
Operator id: 270651

## 2011-07-21 ENCOUNTER — Emergency Department (HOSPITAL_COMMUNITY): Payer: Medicaid Other

## 2011-07-21 ENCOUNTER — Emergency Department (HOSPITAL_COMMUNITY)
Admission: EM | Admit: 2011-07-21 | Discharge: 2011-07-22 | Disposition: A | Discharge status: Other Acute Inpatient Hospital | Payer: Medicaid Other | Source: Home / Self Care | Attending: Emergency Medicine | Admitting: Emergency Medicine

## 2011-07-21 DIAGNOSIS — R0602 Shortness of breath: Secondary | ICD-10-CM | POA: Insufficient documentation

## 2011-07-21 DIAGNOSIS — E119 Type 2 diabetes mellitus without complications: Secondary | ICD-10-CM | POA: Insufficient documentation

## 2011-07-21 DIAGNOSIS — R079 Chest pain, unspecified: Secondary | ICD-10-CM | POA: Insufficient documentation

## 2011-07-21 DIAGNOSIS — I10 Essential (primary) hypertension: Secondary | ICD-10-CM | POA: Insufficient documentation

## 2011-07-21 DIAGNOSIS — E7800 Pure hypercholesterolemia, unspecified: Secondary | ICD-10-CM | POA: Insufficient documentation

## 2011-07-21 DIAGNOSIS — R000 Tachycardia, unspecified: Secondary | ICD-10-CM | POA: Insufficient documentation

## 2011-07-21 DIAGNOSIS — F411 Generalized anxiety disorder: Secondary | ICD-10-CM | POA: Insufficient documentation

## 2011-07-21 DIAGNOSIS — F329 Major depressive disorder, single episode, unspecified: Secondary | ICD-10-CM | POA: Insufficient documentation

## 2011-07-21 DIAGNOSIS — F3289 Other specified depressive episodes: Secondary | ICD-10-CM | POA: Insufficient documentation

## 2011-07-21 DIAGNOSIS — R064 Hyperventilation: Secondary | ICD-10-CM | POA: Insufficient documentation

## 2011-07-21 DIAGNOSIS — Z79899 Other long term (current) drug therapy: Secondary | ICD-10-CM | POA: Insufficient documentation

## 2011-07-21 DIAGNOSIS — Z794 Long term (current) use of insulin: Secondary | ICD-10-CM | POA: Insufficient documentation

## 2011-07-21 DIAGNOSIS — R45851 Suicidal ideations: Secondary | ICD-10-CM | POA: Insufficient documentation

## 2011-07-21 LAB — URINALYSIS, ROUTINE W REFLEX MICROSCOPIC
Glucose, UA: NEGATIVE mg/dL
Hgb urine dipstick: NEGATIVE
Protein, ur: 30 mg/dL — AB

## 2011-07-21 LAB — POCT I-STAT TROPONIN I: Troponin i, poc: 0.01 ng/mL (ref 0.00–0.08)

## 2011-07-21 LAB — GLUCOSE, CAPILLARY
Glucose-Capillary: 267 mg/dL — ABNORMAL HIGH (ref 70–99)
Glucose-Capillary: 206 mg/dL — ABNORMAL HIGH (ref 70–99)

## 2011-07-21 LAB — COMPREHENSIVE METABOLIC PANEL
Albumin: 4 g/dL (ref 3.5–5.2)
BUN: 12 mg/dL (ref 6–23)
Calcium: 10.4 mg/dL (ref 8.4–10.5)
Creatinine, Ser: 0.61 mg/dL (ref 0.50–1.10)
Total Protein: 7.1 g/dL (ref 6.0–8.3)

## 2011-07-21 LAB — LIPASE, BLOOD: Lipase: 11 U/L (ref 11–59)

## 2011-07-21 LAB — URINE MICROSCOPIC-ADD ON

## 2011-07-22 ENCOUNTER — Inpatient Hospital Stay (HOSPITAL_COMMUNITY)
Admission: AD | Admit: 2011-07-22 | Discharge: 2011-07-30 | DRG: 885 | Disposition: A | Discharge status: Other Acute Inpatient Hospital | Payer: Medicaid Other | Source: Ambulatory Visit | Attending: Internal Medicine | Admitting: Internal Medicine

## 2011-07-22 DIAGNOSIS — IMO0001 Reserved for inherently not codable concepts without codable children: Secondary | ICD-10-CM | POA: Diagnosis present

## 2011-07-22 DIAGNOSIS — F339 Major depressive disorder, recurrent, unspecified: Principal | ICD-10-CM

## 2011-07-22 DIAGNOSIS — R45851 Suicidal ideations: Secondary | ICD-10-CM

## 2011-07-22 DIAGNOSIS — F4001 Agoraphobia with panic disorder: Secondary | ICD-10-CM

## 2011-07-22 DIAGNOSIS — E1149 Type 2 diabetes mellitus with other diabetic neurological complication: Secondary | ICD-10-CM

## 2011-07-22 DIAGNOSIS — E7800 Pure hypercholesterolemia, unspecified: Secondary | ICD-10-CM

## 2011-07-22 DIAGNOSIS — Z794 Long term (current) use of insulin: Secondary | ICD-10-CM

## 2011-07-22 DIAGNOSIS — K3184 Gastroparesis: Secondary | ICD-10-CM

## 2011-07-22 DIAGNOSIS — F32A Depression, unspecified: Secondary | ICD-10-CM | POA: Diagnosis present

## 2011-07-22 DIAGNOSIS — G8929 Other chronic pain: Secondary | ICD-10-CM

## 2011-07-22 DIAGNOSIS — F329 Major depressive disorder, single episode, unspecified: Secondary | ICD-10-CM | POA: Diagnosis present

## 2011-07-22 DIAGNOSIS — F411 Generalized anxiety disorder: Secondary | ICD-10-CM

## 2011-07-22 DIAGNOSIS — M549 Dorsalgia, unspecified: Secondary | ICD-10-CM

## 2011-07-22 DIAGNOSIS — R739 Hyperglycemia, unspecified: Secondary | ICD-10-CM

## 2011-07-22 DIAGNOSIS — I10 Essential (primary) hypertension: Secondary | ICD-10-CM | POA: Diagnosis present

## 2011-07-22 LAB — GLUCOSE, CAPILLARY
Glucose-Capillary: 327 mg/dL — ABNORMAL HIGH (ref 70–99)
Glucose-Capillary: 572 mg/dL (ref 70–99)
Glucose-Capillary: 551 mg/dL (ref 70–99)

## 2011-07-23 DIAGNOSIS — F411 Generalized anxiety disorder: Secondary | ICD-10-CM

## 2011-07-23 DIAGNOSIS — F4001 Agoraphobia with panic disorder: Secondary | ICD-10-CM

## 2011-07-23 DIAGNOSIS — F339 Major depressive disorder, recurrent, unspecified: Secondary | ICD-10-CM

## 2011-07-23 LAB — GLUCOSE, CAPILLARY
Glucose-Capillary: 414 mg/dL — ABNORMAL HIGH (ref 70–99)
Glucose-Capillary: 372 mg/dL — ABNORMAL HIGH (ref 70–99)

## 2011-07-23 LAB — HEPATIC FUNCTION PANEL
ALT: 7 U/L (ref 0–35)
Alkaline Phosphatase: 127 U/L — ABNORMAL HIGH (ref 39–117)
Bilirubin, Direct: 0.1 mg/dL (ref 0.0–0.3)
Indirect Bilirubin: 0.6 mg/dL (ref 0.3–0.9)
Total Bilirubin: 0.7 mg/dL (ref 0.3–1.2)

## 2011-07-24 LAB — GLUCOSE, CAPILLARY
Glucose-Capillary: 268 mg/dL — ABNORMAL HIGH (ref 70–99)
Glucose-Capillary: 212 mg/dL — ABNORMAL HIGH (ref 70–99)
Glucose-Capillary: 211 mg/dL — ABNORMAL HIGH (ref 70–99)

## 2011-07-25 LAB — GLUCOSE, CAPILLARY
Glucose-Capillary: 209 mg/dL — ABNORMAL HIGH (ref 70–99)
Glucose-Capillary: 94 mg/dL (ref 70–99)
Glucose-Capillary: 260 mg/dL — ABNORMAL HIGH (ref 70–99)
Glucose-Capillary: 112 mg/dL — ABNORMAL HIGH (ref 70–99)
Glucose-Capillary: 255 mg/dL — ABNORMAL HIGH (ref 70–99)
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
## 2011-07-26 LAB — GLUCOSE, CAPILLARY
Glucose-Capillary: 40 mg/dL — CL (ref 70–99)
Glucose-Capillary: 100 mg/dL — ABNORMAL HIGH (ref 70–99)
Glucose-Capillary: 331 mg/dL — ABNORMAL HIGH (ref 70–99)
Glucose-Capillary: 227 mg/dL — ABNORMAL HIGH (ref 70–99)

## 2011-07-26 LAB — GLUCOSE, RANDOM: Glucose, Bld: 52 mg/dL — ABNORMAL LOW (ref 70–99)

## 2011-07-27 LAB — GLUCOSE, CAPILLARY
Glucose-Capillary: 75 mg/dL (ref 70–99)
Glucose-Capillary: 116 mg/dL — ABNORMAL HIGH (ref 70–99)
Glucose-Capillary: 316 mg/dL — ABNORMAL HIGH (ref 70–99)
Glucose-Capillary: 88 mg/dL (ref 70–99)

## 2011-07-27 MED ORDER — GABAPENTIN 300 MG PO CAPS
300.0000 mg | ORAL_CAPSULE | ORAL | Status: DC
  Administered 2011-07-27 – 2011-07-30 (×6): 300 mg via ORAL
  Filled 2011-07-27 (×11): qty 1

## 2011-07-27 MED ORDER — INSULIN GLARGINE 100 UNIT/ML ~~LOC~~ SOLN
15.0000 [IU] | Freq: Two times a day (BID) | SUBCUTANEOUS | Status: DC
  Administered 2011-07-28: 15 [IU] via SUBCUTANEOUS

## 2011-07-27 MED ORDER — INSULIN GLARGINE 100 UNIT/ML ~~LOC~~ SOLN: 30.0000 [IU] | Freq: Every day | SUBCUTANEOUS | Status: DC

## 2011-07-27 MED ORDER — NICOTINE 21 MG/24HR TD PT24
21.0000 mg | MEDICATED_PATCH | Freq: Every day | TRANSDERMAL | Status: DC
  Filled 2011-07-27 (×2): qty 1

## 2011-07-27 MED ORDER — HYDROCODONE-ACETAMINOPHEN 5-325 MG PO TABS
1.0000 | ORAL_TABLET | ORAL | Status: DC
  Administered 2011-07-28 – 2011-07-29 (×2): 1 via ORAL
  Filled 2011-07-27 (×2): qty 1

## 2011-07-27 MED ORDER — DULOXETINE HCL 60 MG PO CPEP
60.0000 mg | ORAL_CAPSULE | ORAL | Status: DC
  Administered 2011-07-28 – 2011-07-29 (×4): 60 mg via ORAL
  Filled 2011-07-27 (×7): qty 1

## 2011-07-27 MED ORDER — INSULIN ASPART 100 UNIT/ML ~~LOC~~ SOLN
0.0000 [IU] | Freq: Three times a day (TID) | SUBCUTANEOUS | Status: DC
  Administered 2011-07-28 (×3): 15 [IU] via SUBCUTANEOUS
  Filled 2011-07-27: qty 3

## 2011-07-27 MED ORDER — MAGNESIUM HYDROXIDE 400 MG/5ML PO SUSP: 30.0000 mL | Freq: Every day | ORAL | Status: DC | PRN

## 2011-07-27 MED ORDER — ALUM & MAG HYDROXIDE-SIMETH 200-200-20 MG/5ML PO SUSP: 30.0000 mL | ORAL | Status: DC | PRN

## 2011-07-27 MED ORDER — ACETAMINOPHEN 325 MG PO TABS: 650.0000 mg | ORAL_TABLET | Freq: Four times a day (QID) | ORAL | Status: DC | PRN

## 2011-07-27 MED ORDER — CLONAZEPAM 0.5 MG PO TABS
1.0000 mg | ORAL_TABLET | ORAL | Status: DC
  Administered 2011-07-28 – 2011-07-29 (×4): 1 mg via ORAL
  Filled 2011-07-27 (×2): qty 1

## 2011-07-27 MED ORDER — METOCLOPRAMIDE HCL 10 MG PO TABS
10.0000 mg | ORAL_TABLET | Freq: Three times a day (TID) | ORAL | Status: DC
  Administered 2011-07-28 – 2011-07-29 (×4): 10 mg via ORAL
  Filled 2011-07-27 (×6): qty 1

## 2011-07-27 MED ORDER — ENALAPRIL MALEATE 20 MG PO TABS
20.0000 mg | ORAL_TABLET | Freq: Every day | ORAL | Status: DC
  Administered 2011-07-28 – 2011-07-29 (×2): 20 mg via ORAL
  Filled 2011-07-27 (×4): qty 1

## 2011-07-27 MED ORDER — INSULIN ASPART 100 UNIT/ML ~~LOC~~ SOLN: 0.0000 [IU] | Freq: Every day | SUBCUTANEOUS | Status: DC

## 2011-07-27 MED ORDER — TRAZODONE HCL 50 MG PO TABS
150.0000 mg | ORAL_TABLET | Freq: Every day | ORAL | Status: DC
  Administered 2011-07-27: 150 mg via ORAL
  Filled 2011-07-27 (×4): qty 1

## 2011-07-27 MED ORDER — INSULIN ASPART 100 UNIT/ML ~~LOC~~ SOLN
4.0000 [IU] | Freq: Three times a day (TID) | SUBCUTANEOUS | Status: DC
  Administered 2011-07-28 (×3): 4 [IU] via SUBCUTANEOUS
  Filled 2011-07-27: qty 3

## 2011-07-28 ENCOUNTER — Encounter: Payer: Self-pay | Admitting: *Deleted

## 2011-07-28 ENCOUNTER — Emergency Department (HOSPITAL_COMMUNITY)
Admit: 2011-07-28 | Discharge: 2011-07-28 | Discharge status: Absent without leave | Payer: Medicaid Other | Source: Home / Self Care

## 2011-07-28 ENCOUNTER — Other Ambulatory Visit: Payer: Self-pay

## 2011-07-28 HISTORY — DX: Essential (primary) hypertension: I10

## 2011-07-28 HISTORY — DX: Anemia, unspecified: D64.9

## 2011-07-28 HISTORY — DX: Other chronic pain: G89.29

## 2011-07-28 HISTORY — DX: Dorsalgia, unspecified: M54.9

## 2011-07-28 HISTORY — DX: Pure hypercholesterolemia, unspecified: E78.00

## 2011-07-28 HISTORY — DX: Polyneuropathy, unspecified: G62.9

## 2011-07-28 LAB — GLUCOSE, CAPILLARY
Glucose-Capillary: 120 mg/dL — ABNORMAL HIGH (ref 70–99)
Glucose-Capillary: 365 mg/dL — ABNORMAL HIGH (ref 70–99)
Glucose-Capillary: 455 mg/dL — ABNORMAL HIGH (ref 70–99)

## 2011-07-28 MED ORDER — BUPROPION HCL ER (SR) 150 MG PO TB12: 150.0000 mg | ORAL_TABLET | ORAL | Status: DC

## 2011-07-28 MED ORDER — BUPROPION HCL ER (SR) 150 MG PO TB12
150.0000 mg | ORAL_TABLET | ORAL | Status: DC
  Administered 2011-07-29 – 2011-07-30 (×2): 150 mg via ORAL
  Filled 2011-07-28 (×4): qty 1

## 2011-07-28 MED ORDER — INSULIN ASPART 100 UNIT/ML ~~LOC~~ SOLN
19.0000 [IU] | Freq: Once | SUBCUTANEOUS | Status: AC
  Administered 2011-07-28: 19 [IU] via SUBCUTANEOUS
  Filled 2011-07-28: qty 3

## 2011-07-28 NOTE — ED Notes (Signed)
Pt has been in rm 13 in [facility]. Pt been under observation.

## 2011-07-28 NOTE — Progress Notes (Addendum)
  Pt is brittle diabetic, cbg's have been in 400 range all day, hs been covered with total of 36 units novalog since 16:30, last cbg at 20:20 was still 419.  S/p falls x 2 in past 24 hours. Transferring to [facility] for eval.

## 2011-07-28 NOTE — Psych (Signed)
07/28/2011 1900 DAR D Pt's CBG 365 at 1200. Pt given 4 units novolog insulin with 15 units novolog insulin SQ per MD order.Pt UAL on the 500 hall with walker...tolerated fair. Pt is HIGH fall risk, has tremendous fall potential and poor balance. Pt fell as she was ambulating up the 500 hall. She did not lose consciousness. She reproted she feel on her right hip.  VS were immediately taken and dr readling notified  BP 140/103 HR 93 RR 18. Pt was immobilized while ROM was assessed and no deficit identfiied and pt was assissted to transfer to wheelchair and taken to the dayroom. CBG checked at this time and it was 449. Dr. Allena Katz made aware and pt was given 19 units Novolog insulin SQ. Pt UAL on the unit tolerated fair. CBG was rechecked at 1830 and it was 417. N Mashburn PA  Notified and per her order, pt was given 19 units novolog insulin sq. Pt assisted to get in her bed and tolerated fair. Poor balalnce and very unsteady on her feet. PDuke RN [facility]

## 2011-07-28 NOTE — Progress Notes (Signed)
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   Mild>(1-10) >Severe   Hopelessness (1-10): 8  Depression (1-10): 10 Anxiety (1-10): 8  Suicidal Ideation:  Plan: No  Intent: No  Means: No  Homicidal Ideation:  Plan: No  Intent: No  Means: No Mental Status: AO x 3  General Appearance /Behavior: Disheveled, using a walker.  Eye Contact: Fair  Motor Behavior: Psychomotor Retardation.  Speech: Normal but slightly slurred.  Level of Consciousness: Alert  Mood: Depressed - Severe Affect:  Moderately Constricted  Anxiety Level: Moderate to Severe Thought Process: Coherent  Thought Content: WNL  Perception: Normal  Judgment: Fair  Insight: Fair  Cognition: Orientation time, place and person  Sleep: Good   Vital Signs:Blood pressure 140/87, pulse 71, temperature 97.9 F (36.6 C), temperature source Oral, resp. rate 16, height 5\' 2"  (1.575 m), weight 73.02 kg (160 lb 15.7 oz).  Lab Results:  Results for orders placed during the hospital encounter of 07/22/11 (from the past 48 hour(s))  GLUCOSE, CAPILLARY     Status: Abnormal   Collection Time   07/26/11  4:46 PM      Component Value Range Comment   Glucose-Capillary 227 (*) 70 - 99 (mg/dL)    Comment 1 Notify RN     GLUCOSE, CAPILLARY     Status: Abnormal   Collection Time   07/26/11  8:56 PM      Component Value Range Comment   Glucose-Capillary 148 (*) 70 - 99 (mg/dL)    Comment 1 Notify RN     GLUCOSE, CAPILLARY     Status: Abnormal   Collection Time   07/27/11  6:02 AM      Component Value Range Comment   Glucose-Capillary 54 (*) 70 - 99 (mg/dL)    Comment 1 Notify RN     GLUCOSE, CAPILLARY     Status: Normal   Collection Time   07/27/11   6:28 AM      Component Value Range Comment   Glucose-Capillary 75  70 - 99 (mg/dL)    Comment 1 Notify RN     GLUCOSE, CAPILLARY     Status: Abnormal   Collection Time   07/27/11  6:45 AM      Component Value Range Comment   Glucose-Capillary 116 (*) 70 - 99 (mg/dL)    Comment 1 Notify RN     GLUCOSE, CAPILLARY     Status: Abnormal   Collection Time   07/27/11 11:50 AM      Component Value Range Comment   Glucose-Capillary 373 (*) 70 - 99 (mg/dL)    Comment 1 Notify RN     GLUCOSE, CAPILLARY     Status: Abnormal   Collection Time   07/27/11  5:26 PM      Component Value Range Comment   Glucose-Capillary 592 (*) 70 - 99 (mg/dL)    Comment 1 Notify RN     GLUCOSE, CAPILLARY     Status: Abnormal   Collection Time   07/27/11  5:28 PM      Component Value Range Comment  Glucose-Capillary 549 (*) 70 - 99 (mg/dL)    Comment 1 Notify RN     GLUCOSE, CAPILLARY     Status: Abnormal   Collection Time   07/27/11  7:17 PM      Component Value Range Comment   Glucose-Capillary 316 (*) 70 - 99 (mg/dL)    Comment 1 Notify RN     GLUCOSE, CAPILLARY     Status: Abnormal   Collection Time   07/27/11  8:31 PM      Component Value Range Comment   Glucose-Capillary 137 (*) 70 - 99 (mg/dL)    Comment 1 Notify RN     GLUCOSE, CAPILLARY     Status: Normal   Collection Time   07/27/11  9:14 PM      Component Value Range Comment   Glucose-Capillary 88  70 - 99 (mg/dL)    Comment 1 Notify RN     GLUCOSE, CAPILLARY     Status: Abnormal   Collection Time   07/28/11  7:28 AM      Component Value Range Comment   Glucose-Capillary 120 (*) 70 - 99 (mg/dL)    Comment 1 Notify RN      Comment 2 Documented in Chart     GLUCOSE, CAPILLARY     Status: Abnormal   Collection Time   07/28/11 11:47 AM      Component Value Range Comment   Glucose-Capillary 365 (*) 70 - 99 (mg/dL)    Comment 1 Notify RN      Treatment Plan Summary:  Daily contact with patient to assess and evaluate symptoms and progress in  treatment  Medication management  The patient will deny suicidal ideations for 48 hours prior to discharge and have a depression and anxiety rating of 3 or less.   Plan:  1. Continue current medications.  2. Will start Wellbutrin SR 150 mgs po qam to augment antidepressant. 3. Continue to monitor.   Naysha Sholl 07/28/2011, 12:56 PM

## 2011-07-28 NOTE — ED Notes (Signed)
Per EMS:  Pt comes form [facility] with a complaint of increased CBG.  Per EMS, the pt's CBG was 600+.  Pt has fallen 2 time today despite the use of a walker.

## 2011-07-28 NOTE — ED Notes (Signed)
Patients cbg 541

## 2011-07-28 NOTE — Progress Notes (Signed)
[facility] Group Notes:  (Counselor/Nursing/MHT/Case Management/Adjunct)  07/28/2011 1315pm  Type of Therapy:  Psychoeducational Skills  Participation Level:  Did Not Attend   Neila Gear 07/28/2011, 3:18 PM

## 2011-07-28 NOTE — Psych (Signed)
DAR: [name] is status quo today. [name] depressed, has a flat affect, has poor hygiene and her main focus is getting her medications on time. She does not make eye contact. She ambulates using a walker,that she drags up and down the halls. [name] slow to process and is limited in her insight and understanding of not only her disease, but also the severity of her life circumstances. She does not fill out and return her self inventory back to the nurse. She denies SI and / or HI. A [name] medicated per DM order . R Safety is maintained and POC includes  Maintaining therapeutic realtionship with trust. PD RN [facility]

## 2011-07-29 ENCOUNTER — Encounter (HOSPITAL_COMMUNITY): Payer: Self-pay | Admitting: *Deleted

## 2011-07-29 ENCOUNTER — Emergency Department (HOSPITAL_COMMUNITY)
Admission: EM | Admit: 2011-07-29 | Discharge: 2011-07-29 | Disposition: A | Discharge status: Other Acute Inpatient Hospital | Payer: Medicaid Other | Source: Home / Self Care

## 2011-07-29 DIAGNOSIS — IMO0001 Reserved for inherently not codable concepts without codable children: Secondary | ICD-10-CM | POA: Diagnosis present

## 2011-07-29 DIAGNOSIS — I10 Essential (primary) hypertension: Secondary | ICD-10-CM | POA: Diagnosis present

## 2011-07-29 DIAGNOSIS — F329 Major depressive disorder, single episode, unspecified: Secondary | ICD-10-CM | POA: Diagnosis present

## 2011-07-29 DIAGNOSIS — F32A Depression, unspecified: Secondary | ICD-10-CM | POA: Diagnosis present

## 2011-07-29 LAB — GLUCOSE, CAPILLARY: Glucose-Capillary: 173 mg/dL — ABNORMAL HIGH (ref 70–99)

## 2011-07-29 LAB — DIFFERENTIAL
Basophils Relative: 1 % (ref 0–1)
Eosinophils Absolute: 0.2 10*3/uL (ref 0.0–0.7)
Eosinophils Relative: 2 % (ref 0–5)
Monocytes Absolute: 0.5 10*3/uL (ref 0.1–1.0)
Monocytes Relative: 7 % (ref 3–12)
Neutrophils Relative %: 57 % (ref 43–77)

## 2011-07-29 LAB — POCT I-STAT, CHEM 8
Creatinine, Ser: 0.7 mg/dL (ref 0.50–1.10)
HCT: 43 % (ref 36.0–46.0)
Hemoglobin: 14.6 g/dL (ref 12.0–15.0)
Potassium: 4.3 mEq/L (ref 3.5–5.1)
Sodium: 134 mEq/L — ABNORMAL LOW (ref 135–145)

## 2011-07-29 LAB — COMPREHENSIVE METABOLIC PANEL
ALT: 8 U/L (ref 0–35)
AST: 12 U/L (ref 0–37)
CO2: 28 mEq/L (ref 19–32)
Calcium: 9.2 mg/dL (ref 8.4–10.5)
Chloride: 96 mEq/L (ref 96–112)
GFR calc non Af Amer: >90 mL/min (ref >90)
Sodium: 132 mEq/L — ABNORMAL LOW (ref 135–145)
Total Bilirubin: 0.3 mg/dL (ref 0.3–1.2)

## 2011-07-29 LAB — CBC
Hemoglobin: 13.4 g/dL (ref 12.0–15.0)
MCH: 29.6 pg (ref 26.0–34.0)
MCHC: 33.7 g/dL (ref 30.0–36.0)

## 2011-07-29 LAB — URINALYSIS, ROUTINE W REFLEX MICROSCOPIC
Bilirubin Urine: NEGATIVE
Nitrite: NEGATIVE
Specific Gravity, Urine: 1.008 (ref 1.005–1.030)
Urobilinogen, UA: 0.2 mg/dL (ref 0.0–1.0)

## 2011-07-29 MED ORDER — SODIUM CHLORIDE 0.9 % IV SOLN
Freq: Once | INTRAVENOUS | Status: AC
  Administered 2011-07-29: via INTRAVENOUS

## 2011-07-29 MED ORDER — SODIUM CHLORIDE 0.9 % IV SOLN
Freq: Once | INTRAVENOUS | Status: AC
  Administered 2011-07-29: 1000 mL via INTRAVENOUS

## 2011-07-29 MED ORDER — DEXTROSE-NACL 5-0.45 % IV SOLN
INTRAVENOUS | Status: DC
  Filled 2011-07-29: qty 1000

## 2011-07-29 MED ORDER — SODIUM CHLORIDE 0.9 % IV SOLN
INTRAVENOUS | Status: DC
  Filled 2011-07-29: qty 1000

## 2011-07-29 MED ORDER — SODIUM CHLORIDE 0.9 % IV BOLUS (SEPSIS)
1000.0000 mL | Freq: Once | INTRAVENOUS | Status: AC
  Administered 2011-07-29: 1000 mL via INTRAVENOUS

## 2011-07-29 MED ORDER — INSULIN ASPART 100 UNIT/ML ~~LOC~~ SOLN
15.0000 [IU] | Freq: Once | SUBCUTANEOUS | Status: AC
  Administered 2011-07-29: 15 [IU] via SUBCUTANEOUS
  Filled 2011-07-29: qty 3

## 2011-07-29 MED ORDER — SODIUM CHLORIDE 0.9 % IV SOLN
INTRAVENOUS | Status: DC
  Filled 2011-07-29: qty 1

## 2011-07-29 MED ORDER — HYDROCODONE-ACETAMINOPHEN 5-325 MG PO TABS: 2.0000 | ORAL_TABLET | ORAL | Status: DC

## 2011-07-29 MED ORDER — METFORMIN HCL 500 MG PO TABS
1000.0000 mg | ORAL_TABLET | ORAL | Status: DC
  Administered 2011-07-30: 1000 mg via ORAL
  Filled 2011-07-29 (×3): qty 2

## 2011-07-29 MED ORDER — INSULIN ASPART 100 UNIT/ML ~~LOC~~ SOLN
SUBCUTANEOUS | Status: AC
  Administered 2011-07-29: 15 [IU] via SUBCUTANEOUS
  Filled 2011-07-29: qty 3

## 2011-07-29 MED ORDER — DEXTROSE 50 % IV SOLN
25.0000 mL | INTRAVENOUS | Status: DC | PRN
  Filled 2011-07-29: qty 50

## 2011-07-29 MED ORDER — INSULIN GLARGINE 100 UNIT/ML ~~LOC~~ SOLN: 40.0000 [IU] | Freq: Every day | SUBCUTANEOUS | Status: DC

## 2011-07-29 MED ORDER — INSULIN ASPART 100 UNIT/ML ~~LOC~~ SOLN
10.0000 [IU] | Freq: Once | SUBCUTANEOUS | Status: AC
  Administered 2011-07-29: 10 [IU] via SUBCUTANEOUS

## 2011-07-29 NOTE — Progress Notes (Signed)
  Pt was sent to [facility] for eval of high cbg's at the beginning of this shift,  which she carried all day, in spite of insulin coverage.  Had also fallen twice in the past 24 hours.  She was initially drowsy and hard to arouse, but able to follow simple commands, grasps were equal.  Was taken  By EMS, where she remains at this time.

## 2011-07-29 NOTE — ED Provider Notes (Signed)
Pt stable u/a normal.  Dx hyperglycemia  Benny Lennert, MD 07/29/11 [id number]

## 2011-07-29 NOTE — ED Notes (Signed)
Patient here from [facility] after fall while getting water, patient complains of lower backpain

## 2011-07-29 NOTE — ED Notes (Signed)
Patient [name] was 43

## 2011-07-29 NOTE — Progress Notes (Signed)
[facility] ([facility]) MD Progress Note  07/29/2011 1:52 PM  The patient was seen today and reports the following:   Diagnosis: Axis I:  Major Depressive Disorder - Recurrent                                  Generalized Anxiety Disorder                                  Panic Disorder with Agoraphobia  ADL's: Disheveled  Sleep: The patient reports poor sleep last night due to being in the ED for elevated CBG.  Appetite: Yes, AEB: Good Appetite.  Mild>(1-10) >Severe  Hopelessness (1-10): 10 Depression (1-10): 10  Anxiety (1-10): 10  Suicidal Ideation:  The patient denies suicidal ideations today. Plan: No  Intent: No  Means: No  Homicidal Ideation:  The patient denies homicidal ideations. Plan: No  Intent: No  Means: No Mental Status: AO x 3  General Appearance /Behavior: Disheveled, using a walker.  Eye Contact: Fair  Motor Behavior: Psychomotor Retardation.  Speech: Normal but slightly slurred.  Level of Consciousness: Alert  Mood: Depressed - Severe  Affect: Moderately Constricted and tearful.  Anxiety Level: Severe  Thought Process: Coherent  Thought Content: WNL  Perception: Normal  Judgment: Fair  Insight: Fair  Cognition: Orientation time, place and person    Vital Signs:Blood pressure 156/73, pulse 80, temperature 98.8 F (37.1 C), temperature source Oral, resp. rate 18, height 5\' 2"  (1.575 m), weight 73.02 kg (160 lb 15.7 oz), SpO2 95.00%.  Lab Results:  Results for orders placed during the hospital encounter of 07/22/11 (from the past 48 hour(s))  GLUCOSE, CAPILLARY     Status: Abnormal   Collection Time   07/27/11  5:26 PM      Component Value Range Comment   Glucose-Capillary 592 (*) 70 - 99 (mg/dL)    Comment 1 Notify RN     GLUCOSE, CAPILLARY     Status: Abnormal   Collection Time   07/27/11  5:28 PM      Component Value Range Comment   Glucose-Capillary 549 (*) 70 - 99 (mg/dL)    Comment 1 Notify RN     GLUCOSE, CAPILLARY     Status: Abnormal   Collection Time   07/27/11  7:17 PM      Component Value Range Comment   Glucose-Capillary 316 (*) 70 - 99 (mg/dL)    Comment 1 Notify RN     GLUCOSE, CAPILLARY     Status: Abnormal   Collection Time   07/27/11  8:31 PM      Component Value Range Comment   Glucose-Capillary 137 (*) 70 - 99 (mg/dL)    Comment 1 Notify RN     GLUCOSE, CAPILLARY     Status: Normal   Collection Time   07/27/11  9:14 PM      Component Value Range Comment   Glucose-Capillary 88  70 - 99 (mg/dL)    Comment 1 Notify RN     GLUCOSE, CAPILLARY     Status: Abnormal   Collection Time   07/28/11  7:28 AM      Component Value Range Comment   Glucose-Capillary 120 (*) 70 - 99 (mg/dL)    Comment 1 Notify RN      Comment 2 Documented in Chart     GLUCOSE, CAPILLARY  Status: Abnormal   Collection Time   07/28/11 11:47 AM      Component Value Range Comment   Glucose-Capillary 365 (*) 70 - 99 (mg/dL)    Comment 1 Notify RN     GLUCOSE, CAPILLARY     Status: Abnormal   Collection Time   07/28/11  4:40 PM      Component Value Range Comment   Glucose-Capillary 455 (*) 70 - 99 (mg/dL)    Comment 1 Notify RN     GLUCOSE, CAPILLARY     Status: Abnormal   Collection Time   07/28/11  6:35 PM      Component Value Range Comment   Glucose-Capillary 417 (*) 70 - 99 (mg/dL)    Comment 1 Notify RN     GLUCOSE, CAPILLARY     Status: Abnormal   Collection Time   07/28/11  8:18 PM      Component Value Range Comment   Glucose-Capillary 419 (*) 70 - 99 (mg/dL)   COMPREHENSIVE METABOLIC PANEL     Status: Abnormal   Collection Time   07/29/11 12:02 AM      Component Value Range Comment   Sodium 132 (*) 135 - 145 (mEq/L)    Potassium 4.2  3.5 - 5.1 (mEq/L)    Chloride 96  96 - 112 (mEq/L)    CO2 28  19 - 32 (mEq/L)    Glucose, Bld 430 (*) 70 - 99 (mg/dL)    BUN 19  6 - 23 (mg/dL)    Creatinine, Ser 1.61  0.50 - 1.10 (mg/dL)    Calcium 9.2  8.4 - 10.5 (mg/dL)    Total Protein 6.5  6.0 - 8.3 (g/dL)    Albumin 3.5  3.5 - 5.2 (g/dL)    AST 12   0 - 37 (U/L)    ALT 8  0 - 35 (U/L)    Alkaline Phosphatase 133 (*) 39 - 117 (U/L)    Total Bilirubin 0.3  0.3 - 1.2 (mg/dL)    GFR calc non Af Amer >90  >90 (mL/min)    GFR calc Af Amer >90  >90 (mL/min)   CBC     Status: Normal   Collection Time   07/29/11 12:23 AM      Component Value Range Comment   WBC 6.6  4.0 - 10.5 (K/uL)    RBC 4.53  3.87 - 5.11 (MIL/uL)    Hemoglobin 13.4  12.0 - 15.0 (g/dL)    HCT 09.6  04.5 - 40.9 (%)    MCV 87.9  78.0 - 100.0 (fL)    MCH 29.6  26.0 - 34.0 (pg)    MCHC 33.7  30.0 - 36.0 (g/dL)    RDW 81.1  91.4 - 78.2 (%)    Platelets 239  150 - 400 (K/uL)   DIFFERENTIAL     Status: Normal   Collection Time   07/29/11 12:23 AM      Component Value Range Comment   Neutrophils Relative 57  43 - 77 (%)    Neutro Abs 3.8  1.7 - 7.7 (K/uL)    Lymphocytes Relative 32  12 - 46 (%)    Lymphs Abs 2.1  0.7 - 4.0 (K/uL)    Monocytes Relative 7  3 - 12 (%)    Monocytes Absolute 0.5  0.1 - 1.0 (K/uL)    Eosinophils Relative 2  0 - 5 (%)    Eosinophils Absolute 0.2  0.0 - 0.7 (K/uL)  Basophils Relative 1  0 - 1 (%)    Basophils Absolute 0.1  0.0 - 0.1 (K/uL)   GLUCOSE, CAPILLARY     Status: Abnormal   Collection Time   07/29/11  3:56 AM      Component Value Range Comment   Glucose-Capillary 263 (*) 70 - 99 (mg/dL)    Comment 1 Documented in Chart      Comment 2 Notify RN     GLUCOSE, CAPILLARY     Status: Abnormal   Collection Time   07/29/11  5:04 AM      Component Value Range Comment   Glucose-Capillary 173 (*) 70 - 99 (mg/dL)    Comment 1 Documented in Chart      Comment 2 Notify RN     GLUCOSE, CAPILLARY     Status: Abnormal   Collection Time   07/29/11  7:48 AM      Component Value Range Comment   Glucose-Capillary 116 (*) 70 - 99 (mg/dL)   URINALYSIS, ROUTINE W REFLEX MICROSCOPIC     Status: Normal   Collection Time   07/29/11  8:44 AM      Component Value Range Comment   Color, Urine YELLOW  YELLOW     Appearance CLEAR  CLEAR     Specific  Gravity, Urine 1.008  1.005 - 1.030     pH 7.0  5.0 - 8.0     Glucose, UA NEGATIVE  NEGATIVE (mg/dL)    Hgb urine dipstick NEGATIVE  NEGATIVE     Bilirubin Urine NEGATIVE  NEGATIVE     Ketones, ur NEGATIVE  NEGATIVE (mg/dL)    Protein, ur NEGATIVE  NEGATIVE (mg/dL)    Urobilinogen, UA 0.2  0.0 - 1.0 (mg/dL)    Nitrite NEGATIVE  NEGATIVE     Leukocytes, UA NEGATIVE  NEGATIVE  MICROSCOPIC NOT DONE ON URINES WITH NEGATIVE PROTEIN, BLOOD, LEUKOCYTES, NITRITE, OR GLUCOSE <1000 mg/dL.  GLUCOSE, CAPILLARY     Status: Abnormal   Collection Time   07/29/11 12:05 PM      Component Value Range Comment   Glucose-Capillary 324 (*) 70 - 99 (mg/dL)    Treatment Plan Summary:  1.  Daily contact with patient to assess and evaluate symptoms and progress in treatment  2.  Medication management  3.  The patient will deny suicidal ideations for 48 hours prior to discharge and have a depression and            anxiety rating of 3 or less.   Plan:  1. Continue current medications.  2. Will increase Vicodin 5/325 to 2 tablets po qam and hs to further address the patient's report of back pain..  3.  Will order an Internal Medicine Consult to further evaluate the patient's diabetes and pain. 4. Continue to monitor.    Elianny Buxbaum 07/29/2011, 1:52 PM

## 2011-07-29 NOTE — ED Notes (Signed)
Patient is resting comfortably. 

## 2011-07-29 NOTE — Consult Note (Signed)
Requesting physician: Dr. Allena Katz  Reason for consultation: Uncontrolled diabetes  History of Present Illness: 50 year old female with history of hypertension, depression, uncontrolled diabetes who was admitted to behavioral health for major depressive disorder. Internal medicine consult request due to high blood glucose levels, uncontrolled with current regimen which includes insulin glargine 15 units twice a day and sliding scale insulin. Pt denies chest pain, SOB, no abdominal or urinary concerns.No headaches or visual changes, no other systemic symptoms.  Allergies:   Allergies  Allergen Reactions  . Celecoxib Nausea Only  . Ibuprofen Nausea Only      Past Medical History  Diagnosis Date  . Diabetes mellitus   . Hypertension   . Anemia   . Chronic back pain   . Depression   . Neuropathy   . Hypercholesterolemia     History reviewed. No pertinent past surgical history.  Medications:  Scheduled Meds:   . sodium chloride   Intravenous Once  . sodium chloride   Intravenous Once  . buPROPion  150 mg Oral QAM  . clonazePAM  1 mg Oral BH-[id number]  . DULoxetine  60 mg Oral BH-q8a2p  . enalapril  20 mg Oral Daily  . gabapentin  300 mg Oral BH-q8a2phs  . HYDROcodone-acetaminophen  2 tablet Oral BH-qamhs  . insulin aspart  10 Units Subcutaneous Once  . insulin aspart  15 Units Subcutaneous Once  . insulin aspart  19 Units Subcutaneous Once  . insulin glargine  15 Units Subcutaneous BID  . metoCLOPramide  10 mg Oral TID AC  . nicotine  21 mg Transdermal Q0600  . sodium chloride  1,000 mL Intravenous Once  . traZODone  150 mg Oral QHS  . DISCONTD: HYDROcodone-acetaminophen  1 tablet Oral BH-qamhs  . DISCONTD: insulin aspart      . DISCONTD: insulin aspart  0-15 Units Subcutaneous TID WC  . DISCONTD: insulin aspart  4 Units Subcutaneous TID WC   Continuous Infusions:   . sodium chloride    . dextrose 5 % and 0.45% NaCl    . insulin (NOVOLIN-R) infusion     PRN  Meds:.acetaminophen, alum & mag hydroxide-simeth, dextrose, magnesium hydroxide  Social History:  does not have a smoking history on file. She does not have any smokeless tobacco history on file. She reports that she does not drink alcohol or use illicit drugs.  History reviewed. No pertinent family history.  Review of Systems:  Constitutional: Denies fever, chills, diaphoresis, appetite change and fatigue.  HEENT: Denies photophobia, eye pain, redness, hearing loss, ear pain, congestion, sore throat, rhinorrhea, sneezing, mouth sores, trouble swallowing, neck pain, neck stiffness and tinnitus.   Respiratory: Denies SOB, DOE, cough, chest tightness,  and wheezing.   Cardiovascular: Denies chest pain, palpitations and leg swelling.  Gastrointestinal: Denies nausea, vomiting, abdominal pain, diarrhea, constipation, blood in stool and abdominal distention.  Genitourinary: Denies dysuria, urgency, frequency, hematuria, flank pain and difficulty urinating.  Musculoskeletal: Denies myalgias, back pain, joint swelling, arthralgias and gait problem.  Skin: Denies pallor, rash and wound.  Neurological: Denies dizziness, seizures, syncope, weakness, light-headedness, numbness and headaches.  Hematological: Denies adenopathy. Easy bruising, personal or family bleeding history  Psychiatric/Behavioral: Denies suicidal ideation, mood changes, confusion, nervousness, sleep disturbance and agitation   Physical Exam:  Filed Vitals:   07/29/11 0339 07/29/11 0552 07/29/11 1020 07/29/11 1035  BP:  167/80  156/73  Pulse:  78 80 80  Temp:   98.8 F (37.1 C) 98.8 F (37.1 C)  TempSrc:  Oral   Resp:   18 18  Height:      Weight:      SpO2: 93% 91% 95%     No intake or output data in the 24 hours ending 07/29/11 1620  General: Alert, awake, oriented x3, in no acute distress. HEENT: No bruits, no goiter. Heart: Regular rate and rhythm, without murmurs, rubs, gallops. Lungs: Clear to auscultation  bilaterally. Abdomen: Soft, nontender, nondistended, positive bowel sounds. Extremities: No clubbing cyanosis or edema with positive pedal pulses. Neuro: Grossly intact, nonfocal.  Labs on Admission:  CBC:    Component Value Date/Time   WBC 6.6 07/29/2011 0023   HGB 14.6 07/29/2011 0033   HCT 43.0 07/29/2011 0033   PLT 239 07/29/2011 0023   MCV 87.9 07/29/2011 0023   NEUTROABS 3.8 07/29/2011 0023   LYMPHSABS 2.1 07/29/2011 0023   MONOABS 0.5 07/29/2011 0023   EOSABS 0.2 07/29/2011 0023   BASOSABS 0.1 07/29/2011 0023    Basic Metabolic Panel:    Component Value Date/Time   NA 134* 07/29/2011 0033   K 4.3 07/29/2011 0033   CL 98 07/29/2011 0033   CO2 28 07/29/2011 0002   BUN 20 07/29/2011 0033   CREATININE 0.70 07/29/2011 0033   GLUCOSE 435* 07/29/2011 0033   CALCIUM 9.2 07/29/2011 0002    Radiological Exams on Admission: No results found.  Assessment/Plan Principal Problem:  *Diabetes mellitus  - uncontrolled, will initiate home medication regimen with lantus 40 units subQ at nightime and metformin 1000 mg BID. Will also check electrolyte panel in the AM.  Active Problems:  Depression - as per primary team   HTN (hypertension) - at goal and appears to be well controlled   Time Spent on Admission: Over 30 minutes  [name] 07/29/2011, 4:20 PM

## 2011-07-29 NOTE — ED Notes (Signed)
Patient has used the bed side commode x5

## 2011-07-29 NOTE — Progress Notes (Signed)
Pt blood sugar 462 at 1630--notified provider who ordered 15 units of novolog and place a consult.  Pt given the 15 units of Novolog insulin in her abdomen and blood sugar reassessed at 1700 with a result of 501--pt was placed in a chair beside the med room.  The RN told the pt she was going to the desk to try and 'catch' the provider since it was 5 and she needed an insulin order-- no order in place.  The provider sent text to consultant to call the RN--when RN returned to the hall, 2 other RNs told her the pt had gone to the bathroom and found sitting on the floor--no apparent injury, full ROM--so RNs assisted the pt to a standing position and she was ambulating without difficulties or complaints.  The RN was interrupted for a MD phone call--message that MD would consult tonight.  AC in the hall and told him and DON about the elevated blood sugar and fall--DON notified on-call MD for transfer to ED--pt sent to ED for elevated blood sugar and any follow-up for fall if needed.  RN completed a safety zone and contacted the pt's emergency contact even though the pt had called before she left.  The person was hard of hearing but repeated back the information and stated he was going to get a lawyer to sue--AC and charged nurse notified

## 2011-07-29 NOTE — ED Provider Notes (Signed)
History     CSN: [id number] Arrival date & time: 07/22/2011  5:37 PM   First MD Initiated Contact with Patient 07/28/11 2357      Chief Complaint  Patient presents with  . Hyperglycemia    (Consider location/radiation/quality/duration/timing/severity/associated sxs/prior treatment) HPI Comments: Pt is a 50  Y/o [name] with hx of DM and Depression who has been at [facility] for the last week for depression and SI - she states that today her CBG went up and she was sent here for further evaluation.  She does admit to intermittent cough but stable chronically, no diarrhea and no rash or fever or n/v.  She has a headache chronically.  She was sent over for eval and tx fo the hyperglycemia.  Sx are constant, gradually getting worse.  She has minimal sx at this time.  The history is provided by the patient and medical records.    Past Medical History  Diagnosis Date  . Diabetes mellitus   . Hypertension   . Anemia   . Chronic back pain   . Depression   . Neuropathy   . Hypercholesterolemia     History reviewed. No pertinent past surgical history.  History reviewed. No pertinent family history.  History  Substance Use Topics  . Smoking status: Not on file  . Smokeless tobacco: Not on file  . Alcohol Use: No    OB History    Grav Para Term Preterm Abortions TAB SAB Ect Mult Living                  Review of Systems  All other systems reviewed and are negative.    Allergies  Celecoxib and Ibuprofen  Home Medications   Current Outpatient Rx  Name Route Sig Dispense Refill  . ALPRAZOLAM 0.5 MG PO TABS Oral Take 0.5 mg by mouth 2 (two) times daily as needed. For anxiety    . CITALOPRAM HYDROBROMIDE 20 MG PO TABS Oral Take 20 mg by mouth at bedtime.     . DULOXETINE HCL 60 MG PO CPEP Oral Take 60 mg by mouth daily.     . ENALAPRIL MALEATE 20 MG PO TABS Oral Take 20 mg by mouth daily.     Marland Kitchen HYDROCODONE-ACETAMINOPHEN 5-500 MG PO TABS Oral Take 1 tablet by mouth daily as  needed. For pain    . INSULIN GLARGINE 100 UNIT/ML [place] SOLN Subcutaneous Inject 40 Units into the skin at bedtime.     . INSULIN LISPRO (HUMAN) 100 UNIT/ML [place] SOLN Subcutaneous Inject 5 Units into the skin 3 (three) times daily before meals. As needed for every 300    . METOCLOPRAMIDE HCL 10 MG PO TABS Oral Take 10 mg by mouth 3 (three) times daily before meals.     . TRAZODONE HCL 100 MG PO TABS Oral Take 100 mg by mouth at bedtime.       BP 146/89  Pulse 79  Temp(Src) 98.3 F (36.8 C) (Oral)  Resp 16  Ht 5\' 2"  (1.575 m)  Wt 160 lb 15.7 oz (73.02 kg)  BMI 29.44 kg/m2  SpO2 95%  Physical Exam  Nursing note and vitals reviewed. Constitutional: She appears well-developed and well-nourished. No distress.  HENT:  Head: Normocephalic and atraumatic.  Mouth/Throat: Oropharynx is clear and moist. No oropharyngeal exudate.  Eyes: Conjunctivae and EOM are normal. Pupils are equal, round, and reactive to light. Right eye exhibits no discharge. Left eye exhibits no discharge. No scleral icterus.  Neck: Normal range of  motion. Neck supple. No JVD present. No thyromegaly present.  Cardiovascular: Normal rate, regular rhythm, normal heart sounds and intact distal pulses.  Exam reveals no gallop and no friction rub.   No murmur heard. Pulmonary/Chest: Effort normal and breath sounds normal. No respiratory distress. She has no wheezes. She has no rales.  Abdominal: Soft. Bowel sounds are normal. She exhibits no distension and no mass. There is no tenderness.  Musculoskeletal: Normal range of motion. She exhibits no edema and no tenderness.  Lymphadenopathy:    She has no cervical adenopathy.  Neurological: [name] alert. Coordination normal.  Skin: Skin is warm and dry. No rash noted. No erythema.  Psychiatric: She has a normal mood and affect. Her behavior is normal.    ED Course  Procedures (including critical care time)  No results found.   1. Hyperglycemia       MDM  Well  appearing, has high CBG > 500, VS normal, start insulin after K back.   Patient was given IV insulin with good improvement of her blood sugar down to 173. She has tolerated a meal without nausea or vomiting. She has no signs of diabetic ketoacidosis and lab work was unremarkable.  UA shows no signs of urinary infection - tolerated PO prior to d/c.  Pt to be transferred back to [facility] for ongoing depression treatment.  Vida Roller, MD 07/29/11 2002

## 2011-07-29 NOTE — ED Notes (Signed)
Patient cbg was 173

## 2011-07-30 ENCOUNTER — Inpatient Hospital Stay (HOSPITAL_COMMUNITY)
Admission: RE | Admit: 2011-07-30 | Discharge: 2011-08-09 | DRG: 639 | Payer: Medicaid Other | Source: Ambulatory Visit | Attending: Internal Medicine | Admitting: Internal Medicine

## 2011-07-30 ENCOUNTER — Other Ambulatory Visit: Payer: Self-pay

## 2011-07-30 ENCOUNTER — Inpatient Hospital Stay (HOSPITAL_COMMUNITY): Payer: Medicaid Other

## 2011-07-30 DIAGNOSIS — Z794 Long term (current) use of insulin: Secondary | ICD-10-CM

## 2011-07-30 DIAGNOSIS — I10 Essential (primary) hypertension: Secondary | ICD-10-CM | POA: Diagnosis present

## 2011-07-30 DIAGNOSIS — E1149 Type 2 diabetes mellitus with other diabetic neurological complication: Secondary | ICD-10-CM | POA: Diagnosis present

## 2011-07-30 DIAGNOSIS — F329 Major depressive disorder, single episode, unspecified: Secondary | ICD-10-CM | POA: Diagnosis present

## 2011-07-30 DIAGNOSIS — F411 Generalized anxiety disorder: Secondary | ICD-10-CM | POA: Diagnosis present

## 2011-07-30 DIAGNOSIS — E1169 Type 2 diabetes mellitus with other specified complication: Principal | ICD-10-CM | POA: Diagnosis present

## 2011-07-30 DIAGNOSIS — F3289 Other specified depressive episodes: Secondary | ICD-10-CM | POA: Diagnosis present

## 2011-07-30 DIAGNOSIS — M549 Dorsalgia, unspecified: Secondary | ICD-10-CM | POA: Diagnosis present

## 2011-07-30 DIAGNOSIS — E1142 Type 2 diabetes mellitus with diabetic polyneuropathy: Secondary | ICD-10-CM | POA: Diagnosis present

## 2011-07-30 DIAGNOSIS — Z720 Tobacco use: Secondary | ICD-10-CM

## 2011-07-30 DIAGNOSIS — F17200 Nicotine dependence, unspecified, uncomplicated: Secondary | ICD-10-CM | POA: Diagnosis present

## 2011-07-30 DIAGNOSIS — E7800 Pure hypercholesterolemia, unspecified: Secondary | ICD-10-CM | POA: Diagnosis present

## 2011-07-30 LAB — GLUCOSE, CAPILLARY
Glucose-Capillary: 386 mg/dL — ABNORMAL HIGH (ref 70–99)
Glucose-Capillary: 219 mg/dL — ABNORMAL HIGH (ref 70–99)
Glucose-Capillary: 150 mg/dL — ABNORMAL HIGH (ref 70–99)
Glucose-Capillary: 37 mg/dL — CL (ref 70–99)
Glucose-Capillary: 162 mg/dL — ABNORMAL HIGH (ref 70–99)
Glucose-Capillary: 367 mg/dL — ABNORMAL HIGH (ref 70–99)
Glucose-Capillary: 440 mg/dL — ABNORMAL HIGH (ref 70–99)

## 2011-07-30 LAB — URINALYSIS, ROUTINE W REFLEX MICROSCOPIC
Bilirubin Urine: NEGATIVE
Glucose, UA: >1000 mg/dL — AB
Hgb urine dipstick: NEGATIVE
Ketones, ur: 40 mg/dL — AB
Nitrite: NEGATIVE
pH: 6.5 (ref 5.0–8.0)

## 2011-07-30 LAB — BASIC METABOLIC PANEL
BUN: 18 mg/dL (ref 6–23)
Chloride: 91 mEq/L — ABNORMAL LOW (ref 96–112)
Creatinine, Ser: 0.61 mg/dL (ref 0.50–1.10)
GFR calc Af Amer: >90 mL/min (ref >90)

## 2011-07-30 LAB — CBC
HCT: 39.7 % (ref 36.0–46.0)
HCT: 37 % (ref 36.0–46.0)
Hemoglobin: 13.1 g/dL (ref 12.0–15.0)
MCH: 30 pg (ref 26.0–34.0)
MCHC: 35.4 g/dL (ref 30.0–36.0)
MCV: 86.1 fL (ref 78.0–100.0)
MCV: 84.7 fL (ref 78.0–100.0)
RBC: 4.37 MIL/uL (ref 3.87–5.11)
RDW: 12.6 % (ref 11.5–15.5)
WBC: 7.4 10*3/uL (ref 4.0–10.5)

## 2011-07-30 LAB — URINE MICROSCOPIC-ADD ON

## 2011-07-30 LAB — CREATININE, SERUM: Creatinine, Ser: 0.6 mg/dL (ref 0.50–1.10)

## 2011-07-30 LAB — TSH: TSH: 2.266 u[IU]/mL (ref 0.350–4.500)

## 2011-07-30 MED ORDER — DEXTROSE-NACL 5-0.45 % IV SOLN: INTRAVENOUS | Status: DC

## 2011-07-30 MED ORDER — SODIUM CHLORIDE 0.9 % IV SOLN
INTRAVENOUS | Status: DC
  Administered 2011-07-30: 4 [IU]/h via INTRAVENOUS
  Administered 2011-07-30: 6.5 [IU]/h via INTRAVENOUS
  Filled 2011-07-30: qty 1

## 2011-07-30 MED ORDER — ACETAMINOPHEN 650 MG RE SUPP: 650.0000 mg | Freq: Four times a day (QID) | RECTAL | Status: DC | PRN

## 2011-07-30 MED ORDER — HYDROCODONE-ACETAMINOPHEN 5-325 MG PO TABS
1.0000 | ORAL_TABLET | Freq: Four times a day (QID) | ORAL | Status: DC | PRN
  Administered 2011-07-30 – 2011-08-09 (×21): 2 via ORAL
  Administered 2011-08-09: 1 via ORAL
  Filled 2011-07-30 (×20): qty 2
  Filled 2011-07-30: qty 1
  Filled 2011-07-30 (×2): qty 2

## 2011-07-30 MED ORDER — AMLODIPINE BESYLATE 10 MG PO TABS
10.0000 mg | ORAL_TABLET | Freq: Every day | ORAL | Status: DC
  Filled 2011-07-30: qty 1

## 2011-07-30 MED ORDER — INSULIN ASPART 100 UNIT/ML ~~LOC~~ SOLN
12.0000 [IU] | Freq: Three times a day (TID) | SUBCUTANEOUS | Status: DC
  Filled 2011-07-30: qty 3

## 2011-07-30 MED ORDER — ENALAPRIL MALEATE 20 MG PO TABS
20.0000 mg | ORAL_TABLET | Freq: Every day | ORAL | Status: DC
  Administered 2011-07-30 – 2011-08-09 (×11): 20 mg via ORAL
  Filled 2011-07-30 (×12): qty 1

## 2011-07-30 MED ORDER — SENNOSIDES-DOCUSATE SODIUM 8.6-50 MG PO TABS
1.0000 | ORAL_TABLET | Freq: Every day | ORAL | Status: DC | PRN
  Administered 2011-08-03: 1 via ORAL
  Filled 2011-07-30: qty 1

## 2011-07-30 MED ORDER — INSULIN ASPART 100 UNIT/ML ~~LOC~~ SOLN: 0.0000 [IU] | SUBCUTANEOUS | Status: DC

## 2011-07-30 MED ORDER — ACETAMINOPHEN 325 MG PO TABS: 650.0000 mg | ORAL_TABLET | Freq: Four times a day (QID) | ORAL | Status: DC | PRN

## 2011-07-30 MED ORDER — CITALOPRAM HYDROBROMIDE 20 MG PO TABS
20.0000 mg | ORAL_TABLET | Freq: Every day | ORAL | Status: DC
  Filled 2011-07-30: qty 1

## 2011-07-30 MED ORDER — CITALOPRAM HYDROBROMIDE 20 MG PO TABS
20.0000 mg | ORAL_TABLET | Freq: Every day | ORAL | Status: DC
  Administered 2011-07-30 – 2011-08-08 (×10): 20 mg via ORAL
  Filled 2011-07-30 (×12): qty 1

## 2011-07-30 MED ORDER — ENOXAPARIN SODIUM 40 MG/0.4ML ~~LOC~~ SOLN
40.0000 mg | SUBCUTANEOUS | Status: DC
  Administered 2011-07-31 – 2011-08-09 (×10): 40 mg via SUBCUTANEOUS
  Filled 2011-07-30 (×13): qty 0.4

## 2011-07-30 MED ORDER — ONDANSETRON HCL 4 MG PO TABS
4.0000 mg | ORAL_TABLET | Freq: Four times a day (QID) | ORAL | Status: DC | PRN
  Administered 2011-08-03: 4 mg via ORAL
  Filled 2011-07-30: qty 1

## 2011-07-30 MED ORDER — HYDROCODONE-ACETAMINOPHEN 5-325 MG PO TABS: 1.0000 | ORAL_TABLET | Freq: Four times a day (QID) | ORAL | Status: DC | PRN

## 2011-07-30 MED ORDER — INSULIN ASPART 100 UNIT/ML ~~LOC~~ SOLN
0.0000 [IU] | SUBCUTANEOUS | Status: DC
Start: 2011-07-30 — End: 2011-07-30
  Filled 2011-07-30: qty 3

## 2011-07-30 MED ORDER — AMLODIPINE BESYLATE 10 MG PO TABS
10.0000 mg | ORAL_TABLET | Freq: Every day | ORAL | Status: DC
  Administered 2011-07-30 – 2011-08-09 (×11): 10 mg via ORAL
  Filled 2011-07-30 (×12): qty 1

## 2011-07-30 MED ORDER — SODIUM CHLORIDE 0.9 % IV BOLUS (SEPSIS): 1000.0000 mL | Freq: Once | INTRAVENOUS | Status: DC

## 2011-07-30 MED ORDER — DULOXETINE HCL 60 MG PO CPEP
60.0000 mg | ORAL_CAPSULE | Freq: Every day | ORAL | Status: DC
  Filled 2011-07-30: qty 1

## 2011-07-30 MED ORDER — INSULIN GLARGINE 100 UNIT/ML ~~LOC~~ SOLN
40.0000 [IU] | Freq: Every day | SUBCUTANEOUS | Status: DC
  Filled 2011-07-30: qty 3

## 2011-07-30 MED ORDER — SODIUM CHLORIDE 0.9 % IV SOLN
INTRAVENOUS | Status: DC
  Administered 2011-07-30 – 2011-08-01 (×4): via INTRAVENOUS

## 2011-07-30 MED ORDER — ENOXAPARIN SODIUM 40 MG/0.4ML ~~LOC~~ SOLN
40.0000 mg | SUBCUTANEOUS | Status: DC
  Filled 2011-07-30: qty 0.4

## 2011-07-30 MED ORDER — SODIUM CHLORIDE 0.9 % IV SOLN
INTRAVENOUS | Status: DC
  Administered 2011-07-30: 1000 mL via INTRAVENOUS

## 2011-07-30 MED ORDER — ONDANSETRON HCL 4 MG/2ML IJ SOLN
4.0000 mg | Freq: Four times a day (QID) | INTRAMUSCULAR | Status: DC | PRN
  Administered 2011-07-31: 4 mg via INTRAVENOUS
  Filled 2011-07-30: qty 2

## 2011-07-30 MED ORDER — INSULIN ASPART 100 UNIT/ML ~~LOC~~ SOLN
0.0000 [IU] | SUBCUTANEOUS | Status: DC
  Administered 2011-07-30: 15 [IU] via SUBCUTANEOUS
  Administered 2011-07-31: 11 [IU] via SUBCUTANEOUS
  Filled 2011-07-30: qty 3

## 2011-07-30 MED ORDER — DEXTROSE 50 % IV SOLN
INTRAVENOUS | Status: AC
  Administered 2011-07-30: 50 mL via INTRAVENOUS
  Filled 2011-07-30: qty 50

## 2011-07-30 MED ORDER — METOCLOPRAMIDE HCL 10 MG PO TABS: 10.0000 mg | ORAL_TABLET | Freq: Three times a day (TID) | ORAL | Status: DC

## 2011-07-30 MED ORDER — INSULIN ASPART 100 UNIT/ML ~~LOC~~ SOLN: 12.0000 [IU] | Freq: Three times a day (TID) | SUBCUTANEOUS | Status: DC

## 2011-07-30 MED ORDER — ENALAPRIL MALEATE 20 MG PO TABS
20.0000 mg | ORAL_TABLET | Freq: Every day | ORAL | Status: DC
  Filled 2011-07-30: qty 1

## 2011-07-30 MED ORDER — METOCLOPRAMIDE HCL 10 MG PO TABS
10.0000 mg | ORAL_TABLET | Freq: Three times a day (TID) | ORAL | Status: DC
  Administered 2011-07-31 – 2011-08-09 (×29): 10 mg via ORAL
  Filled 2011-07-30 (×36): qty 1

## 2011-07-30 MED ORDER — ALPRAZOLAM 0.5 MG PO TABS: 0.5000 mg | ORAL_TABLET | Freq: Two times a day (BID) | ORAL | Status: DC | PRN

## 2011-07-30 MED ORDER — DEXTROSE 50 % IV SOLN
50.0000 mL | Freq: Once | INTRAVENOUS | Status: AC | PRN
  Administered 2011-07-30: 50 mL via INTRAVENOUS

## 2011-07-30 MED ORDER — DULOXETINE HCL 60 MG PO CPEP
60.0000 mg | ORAL_CAPSULE | Freq: Every day | ORAL | Status: DC
  Administered 2011-07-30 – 2011-08-09 (×11): 60 mg via ORAL
  Filled 2011-07-30 (×12): qty 1

## 2011-07-30 MED ORDER — DEXTROSE 50 % IV SOLN: 25.0000 mL | INTRAVENOUS | Status: DC | PRN

## 2011-07-30 MED ORDER — ALPRAZOLAM 0.5 MG PO TABS
0.5000 mg | ORAL_TABLET | Freq: Two times a day (BID) | ORAL | Status: DC | PRN
  Administered 2011-08-04 – 2011-08-09 (×8): 0.5 mg via ORAL
  Filled 2011-07-30 (×9): qty 1

## 2011-07-30 MED ORDER — TRAZODONE HCL 100 MG PO TABS
100.0000 mg | ORAL_TABLET | Freq: Every day | ORAL | Status: DC
  Administered 2011-07-30 – 2011-08-08 (×10): 100 mg via ORAL
  Filled 2011-07-30 (×13): qty 1

## 2011-07-30 MED ORDER — INSULIN GLARGINE 100 UNIT/ML ~~LOC~~ SOLN: 40.0000 [IU] | Freq: Every day | SUBCUTANEOUS | Status: DC

## 2011-07-30 MED ORDER — SODIUM CHLORIDE 0.9 % IV SOLN
INTRAVENOUS | Status: DC
  Administered 2011-07-30: 09:00:00 via INTRAVENOUS

## 2011-07-30 MED ORDER — TRAZODONE HCL 100 MG PO TABS: 100.0000 mg | ORAL_TABLET | Freq: Every day | ORAL | Status: DC

## 2011-07-30 NOTE — Significant Event (Signed)
CBG: 24  Treatment: D50 IV 50 mL  Symptoms: Pale  Follow-up CBG: Time:1600 CBG Result:150  Possible Reasons for Event: Medication regimen: recent insulin drip  Comments/MD notified: M. Wilhelmina Mcardle, Nelida Gores

## 2011-07-30 NOTE — ED Notes (Signed)
Patient here from [facility] after falling while getting water at [facility]. Patient actually arrived and triaged at 18:53

## 2011-07-30 NOTE — ED Notes (Signed)
Labs not to be drawn Per RN

## 2011-07-30 NOTE — ED Provider Notes (Signed)
History     CSN: [id number] Arrival date & time: 07/22/2011  5:37 PM   First MD Initiated Contact with Patient 07/28/11 2357      Chief Complaint  Patient presents with  . Fall    (Consider location/radiation/quality/duration/timing/severity/associated sxs/prior treatment) Patient is a 50 y.o. female presenting with fall. The history is provided by the patient.  Fall   Patient here with fall while at [facility].  Patient normally uses a walker and was using one today when she became weak in her legs and fell onto her buttocks. No loss of consciousness patient denies any radicular pain to her lower extremities. Patient also has been having increased blood sugars while an inpatient at the behavioral health hospital. Does have some polyuria and polydipsia. Patient has been receiving IV fluids and insulin but sugars remain elevated. Patient sent here for admission for uncontrollable blood sugars and for evaluation of her fall  Past Medical History  Diagnosis Date  . Diabetes mellitus   . Hypertension   . Anemia   . Chronic back pain   . Depression   . Neuropathy   . Hypercholesterolemia     History reviewed. No pertinent past surgical history.  History reviewed. No pertinent family history.  History  Substance Use Topics  . Smoking status: Not on file  . Smokeless tobacco: Not on file  . Alcohol Use: No    OB History    Grav Para Term Preterm Abortions TAB SAB Ect Mult Living                  Review of Systems  All other systems reviewed and are negative.    Allergies  Celecoxib and Ibuprofen  Home Medications   Current Outpatient Rx  Name Route Sig Dispense Refill  . ALPRAZOLAM 0.5 MG PO TABS Oral Take 0.5 mg by mouth 2 (two) times daily as needed. For anxiety    . CITALOPRAM HYDROBROMIDE 20 MG PO TABS Oral Take 20 mg by mouth at bedtime.     . DULOXETINE HCL 60 MG PO CPEP Oral Take 60 mg by mouth daily.     . ENALAPRIL MALEATE 20 MG PO TABS Oral Take 20 mg by  mouth daily.     Marland Kitchen HYDROCODONE-ACETAMINOPHEN 5-500 MG PO TABS Oral Take 1 tablet by mouth daily as needed. For pain    . INSULIN GLARGINE 100 UNIT/ML [place] SOLN Subcutaneous Inject 40 Units into the skin at bedtime.     . INSULIN LISPRO (HUMAN) 100 UNIT/ML [place] SOLN Subcutaneous Inject 5 Units into the skin 3 (three) times daily before meals. As needed for every 300    . METOCLOPRAMIDE HCL 10 MG PO TABS Oral Take 10 mg by mouth 3 (three) times daily before meals.     . TRAZODONE HCL 100 MG PO TABS Oral Take 100 mg by mouth at bedtime.       BP 146/89  Pulse 79  Temp(Src) 98.3 F (36.8 C) (Oral)  Resp 16  Ht 5\' 2"  (1.575 m)  Wt 160 lb 15.7 oz (73.02 kg)  BMI 29.44 kg/m2  SpO2 95%  Physical Exam  Nursing note and vitals reviewed. Constitutional: [name] oriented to person, place, and time. She appears well-developed and well-nourished.  Non-toxic appearance. No distress.  HENT:  Head: Normocephalic and atraumatic.  Eyes: Conjunctivae and EOM are normal. Pupils are equal, round, and reactive to light.  Neck: Normal range of motion. Neck supple. No tracheal deviation present.  Cardiovascular: Normal  rate, regular rhythm and normal heart sounds.  Exam reveals no gallop.   No murmur heard. Pulmonary/Chest: Effort normal and breath sounds normal. No stridor. No respiratory distress. She has no wheezes.  Abdominal: Soft. Normal appearance and bowel sounds are normal. She exhibits no distension. There is no tenderness. There is no rebound.  Musculoskeletal: Normal range of motion. She exhibits no edema and no tenderness.       Lumbar back: She exhibits tenderness, pain and spasm. She exhibits no bony tenderness and no swelling.       Back:  Neurological: [name] alert and oriented to person, place, and time. She has normal strength. No cranial nerve deficit or sensory deficit. Gait abnormal. GCS eye subscore is 4. GCS verbal subscore is 5. GCS motor subscore is 6.  Skin: Skin is warm and dry.    Psychiatric: Her speech is normal and behavior is normal. Her affect is blunt. She exhibits a depressed mood.    ED Course  Procedures (including critical care time)  Labs Reviewed  GLUCOSE, CAPILLARY - Abnormal; Notable for the following:    Glucose-Capillary 572 (*)    All other components within normal limits  GLUCOSE, CAPILLARY - Abnormal; Notable for the following:    Glucose-Capillary 551 (*)    All other components within normal limits  GLUCOSE, CAPILLARY - Abnormal; Notable for the following:    Glucose-Capillary 334 (*)    All other components within normal limits  HEPATIC FUNCTION PANEL - Abnormal; Notable for the following:    Alkaline Phosphatase 127 (*)    All other components within normal limits  GLUCOSE, CAPILLARY - Abnormal; Notable for the following:    Glucose-Capillary 278 (*)    All other components within normal limits  HEMOGLOBIN A1C - Abnormal; Notable for the following:    Hemoglobin A1C 8.8 (*)    Mean Plasma Glucose 206 (*)    All other components within normal limits  TSH - Abnormal; Notable for the following:    TSH 8.164 (*)    All other components within normal limits  GLUCOSE, CAPILLARY - Abnormal; Notable for the following:    Glucose-Capillary 414 (*)    All other components within normal limits  GLUCOSE, CAPILLARY - Abnormal; Notable for the following:    Glucose-Capillary 372 (*)    All other components within normal limits  GLUCOSE, CAPILLARY - Abnormal; Notable for the following:    Glucose-Capillary 268 (*)    All other components within normal limits  GLUCOSE, CAPILLARY - Abnormal; Notable for the following:    Glucose-Capillary 212 (*)    All other components within normal limits  GLUCOSE, CAPILLARY - Abnormal; Notable for the following:    Glucose-Capillary 211 (*)    All other components within normal limits  GLUCOSE, CAPILLARY - Abnormal; Notable for the following:    Glucose-Capillary 111 (*)    All other components within  normal limits  GLUCOSE, CAPILLARY - Abnormal; Notable for the following:    Glucose-Capillary 260 (*)    All other components within normal limits  GLUCOSE, CAPILLARY - Abnormal; Notable for the following:    Glucose-Capillary 209 (*)    All other components within normal limits  GLUCOSE, CAPILLARY - Abnormal; Notable for the following:    Glucose-Capillary 112 (*)    All other components within normal limits  GLUCOSE, CAPILLARY - Abnormal; Notable for the following:    Glucose-Capillary 255 (*)    All other components within normal limits  GLUCOSE,  CAPILLARY - Abnormal; Notable for the following:    Glucose-Capillary 38 (*)    All other components within normal limits  GLUCOSE, CAPILLARY - Abnormal; Notable for the following:    Glucose-Capillary 40 (*)    All other components within normal limits  GLUCOSE, CAPILLARY - Abnormal; Notable for the following:    Glucose-Capillary 58 (*)    All other components within normal limits  GLUCOSE, CAPILLARY - Abnormal; Notable for the following:    Glucose-Capillary 100 (*)    All other components within normal limits  GLUCOSE, RANDOM - Abnormal; Notable for the following:    Glucose, Bld 52 (*)    All other components within normal limits  GLUCOSE, CAPILLARY - Abnormal; Notable for the following:    Glucose-Capillary 331 (*)    All other components within normal limits  GLUCOSE, CAPILLARY - Abnormal; Notable for the following:    Glucose-Capillary 227 (*)    All other components within normal limits  GLUCOSE, CAPILLARY - Abnormal; Notable for the following:    Glucose-Capillary 148 (*)    All other components within normal limits  GLUCOSE, CAPILLARY - Abnormal; Notable for the following:    Glucose-Capillary 54 (*)    All other components within normal limits  GLUCOSE, CAPILLARY - Abnormal; Notable for the following:    Glucose-Capillary 116 (*)    All other components within normal limits  GLUCOSE, CAPILLARY - Abnormal; Notable for  the following:    Glucose-Capillary 373 (*)    All other components within normal limits  GLUCOSE, CAPILLARY - Abnormal; Notable for the following:    Glucose-Capillary 592 (*)    All other components within normal limits  GLUCOSE, CAPILLARY - Abnormal; Notable for the following:    Glucose-Capillary 549 (*)    All other components within normal limits  GLUCOSE, CAPILLARY - Abnormal; Notable for the following:    Glucose-Capillary 316 (*)    All other components within normal limits  GLUCOSE, CAPILLARY - Abnormal; Notable for the following:    Glucose-Capillary 137 (*)    All other components within normal limits  GLUCOSE, CAPILLARY - Abnormal; Notable for the following:    Glucose-Capillary 120 (*)    All other components within normal limits  GLUCOSE, CAPILLARY - Abnormal; Notable for the following:    Glucose-Capillary 365 (*)    All other components within normal limits  GLUCOSE, CAPILLARY - Abnormal; Notable for the following:    Glucose-Capillary 455 (*)    All other components within normal limits  GLUCOSE, CAPILLARY - Abnormal; Notable for the following:    Glucose-Capillary 417 (*)    All other components within normal limits  GLUCOSE, CAPILLARY - Abnormal; Notable for the following:    Glucose-Capillary 419 (*)    All other components within normal limits  COMPREHENSIVE METABOLIC PANEL - Abnormal; Notable for the following:    Sodium 132 (*)    Glucose, Bld 430 (*)    Alkaline Phosphatase 133 (*)    All other components within normal limits  GLUCOSE, CAPILLARY - Abnormal; Notable for the following:    Glucose-Capillary 116 (*)    All other components within normal limits  GLUCOSE, CAPILLARY - Abnormal; Notable for the following:    Glucose-Capillary 173 (*)    All other components within normal limits  GLUCOSE, CAPILLARY - Abnormal; Notable for the following:    Glucose-Capillary 263 (*)    All other components within normal limits  GLUCOSE, CAPILLARY - Abnormal;  Notable for the  following:    Glucose-Capillary 324 (*)    All other components within normal limits  POCT I-STAT, CHEM 8 - Abnormal; Notable for the following:    Sodium 134 (*)    Glucose, Bld 435 (*)    Calcium, Ion 1.10 (*)    All other components within normal limits  GLUCOSE, CAPILLARY  T4, FREE  T3, FREE  GLUCOSE, CAPILLARY  GLUCOSE, CAPILLARY  GLUCOSE, CAPILLARY  CBC  DIFFERENTIAL  URINALYSIS, ROUTINE W REFLEX MICROSCOPIC  POCT CBG MONITORING  I-STAT, CHEM 8  POCT CBG MONITORING  POCT CBG MONITORING  BASIC METABOLIC PANEL  POCT CBG MONITORING  POCT CBG MONITORING  BASIC METABOLIC PANEL  BASIC METABOLIC PANEL  BASIC METABOLIC PANEL  BASIC METABOLIC PANEL  POCT CBG MONITORING  CBC  BASIC METABOLIC PANEL  URINALYSIS, ROUTINE W REFLEX MICROSCOPIC   No results found.   1. Hyperglycemia       MDM  Patient given IV fluids and insulin stabilizer, patient to be admitted to [facility] hospitalist        Toy Baker, MD 07/30/11 [phone]

## 2011-07-30 NOTE — H&P (Signed)
PCP:  No primary provider on file.   DOA:  07/22/2011  5:37 PM  Chief Complaint:  Uncontrolled diabetes.  HPI: 50 -year-old female with history of uncontrolled diabetes, hypertension, depression, chronic back pain rule [facility]. Internal medicine consult was requested due to uncontrolled blood glucose measurements. Due to persistent increased chemistry blood glucose M.D. in behavioral health decided to send the patient emergency room for further evaluation and management. Patient has no complaints of chest pain, cough, shortness of breath, palpitations. There are no complaints of blurry vision dizziness or lightheadedness or loss of consciousness. There no complaints of nausea or vomiting or abdominal pain. No complaints of diarrhea or constipation. No complaints of burning sensation on urination or blood in the stool in the urine. As per nurse in behavioral health patient had one episode of fall. Patient is admitted to hospitalist service for further evaluation and management.  Allergies: Allergies  Allergen Reactions  . Celecoxib Nausea Only  . Ibuprofen Nausea Only    Prior to Admission medications   Medication Sig Start Date End Date Taking? Authorizing Provider  ALPRAZolam Prudy Feeler) 0.5 MG tablet Take 0.5 mg by mouth 2 (two) times daily as needed. For anxiety   Yes Historical Provider, MD  citalopram (CELEXA) 20 MG tablet Take 20 mg by mouth at bedtime.    Yes Historical Provider, MD  DULoxetine (CYMBALTA) 60 MG capsule Take 60 mg by mouth daily.    Yes Historical Provider, MD  enalapril (VASOTEC) 20 MG tablet Take 20 mg by mouth daily.    Yes Historical Provider, MD  HYDROcodone-acetaminophen (VICODIN) 5-500 MG per tablet Take 1 tablet by mouth daily as needed. For pain   Yes Historical Provider, MD  insulin glargine (LANTUS) 100 UNIT/ML injection Inject 40 Units into the skin at bedtime.    Yes Historical  Provider, MD  insulin lispro (HUMALOG) 100 UNIT/ML injection Inject 5 Units into the skin 3 (three) times daily before meals. As needed for every 300   Yes Historical Provider, MD  metoCLOPramide (REGLAN) 10 MG tablet Take 10 mg by mouth 3 (three) times daily before meals.    Yes Historical Provider, MD  traZODone (DESYREL) 100 MG tablet Take 100 mg by mouth at bedtime.    Yes Historical Provider, MD    Past Medical History  Diagnosis Date  . Diabetes mellitus   . Hypertension   . Anemia   . Chronic back pain   . Depression   . Neuropathy   . Hypercholesterolemia     History reviewed. No pertinent past surgical history.  Social History:  does not have a smoking history on file. She does not have any smokeless tobacco history on file. She reports that she does not drink alcohol or use illicit drugs.  History reviewed. No pertinent family history.  Review of Systems:  Constitutional: Denies fever, chills, diaphoresis, appetite change and fatigue.  HEENT: Denies photophobia, eye pain, redness, hearing loss, ear pain, congestion, sore throat, rhinorrhea, sneezing, mouth sores, trouble swallowing, neck pain, neck stiffness and tinnitus.   Respiratory: Denies SOB, DOE, cough, chest tightness,  and wheezing.   Cardiovascular: Denies chest pain, palpitations and leg swelling.  Gastrointestinal: Denies nausea, vomiting, abdominal pain, diarrhea, constipation, blood in stool and abdominal distention.  Genitourinary: Denies dysuria, urgency, frequency, hematuria, flank pain and difficulty urinating.  Musculoskeletal: Denies myalgias, back pain, joint swelling, arthralgias and gait problem.  Skin: Denies pallor, rash and wound.  Neurological: Denies dizziness, seizures, syncope, weakness, light-headedness, numbness and headaches.  Hematological: Denies adenopathy. Easy bruising, personal or family bleeding history  Psychiatric/Behavioral: Denies suicidal ideation, mood changes, confusion,  nervousness, sleep disturbance and agitation   Physical Exam:  Filed Vitals:   07/30/11 0534 07/30/11 0817 07/30/11 0920 07/30/11 0921  BP: 159/80 166/81  142/60  Pulse: 84 95  85  Temp: 98.4 F (36.9 C) 98 F (36.7 C)  99 F (37.2 C)  TempSrc: Oral Oral  Oral  Resp:  18    Height:      Weight:      SpO2: 93% 96% 98% 98%    Constitutional: Vital signs reviewed.  Patient is a well-developed and well-nourished in no acute distress and cooperative with exam. Alert and oriented x3.  Head: Normocephalic and atraumatic Ear: TM normal bilaterally Mouth: no erythema or exudates, MMM Eyes: PERRL, EOMI, conjunctivae normal, No scleral icterus.  Neck: Supple, Trachea midline normal ROM, No JVD, mass, thyromegaly, or carotid bruit present.  Cardiovascular: RRR, S1 normal, S2 normal, no MRG, pulses symmetric and intact bilaterally Pulmonary/Chest: CTAB, no wheezes, rales, or rhonchi Abdominal: Soft. Non-tender, non-distended, bowel sounds are normal, no masses, organomegaly, or guarding present.  GU: no CVA tenderness Musculoskeletal: No joint deformities, erythema, or stiffness, ROM full and no nontender Ext: no edema and no cyanosis, pulses palpable bilaterally (DP and PT) Hematology: no cervical, inginal, or axillary adenopathy.  Neurological: A&O x3, Strenght is normal and symmetric bilaterally, cranial nerve II-XII are grossly intact, no focal motor deficit, sensory intact to light touch bilaterally.  Skin: Warm, dry and intact. No rash, cyanosis, or clubbing.  Psychiatric: Normal mood and affect. speech and behavior is normal.  Cognition and memory are normal.   Labs on Admission:  Results for orders placed during the hospital encounter of 07/22/11 (from the past 48 hour(s))  GLUCOSE, CAPILLARY     Status: Abnormal   Collection Time   07/28/11 11:47 AM      Component Value Range Comment   Glucose-Capillary 365 (*) 70 - 99 (mg/dL)    Comment 1 Notify RN     GLUCOSE, CAPILLARY      Status: Abnormal   Collection Time   07/28/11  4:40 PM      Component Value Range Comment   Glucose-Capillary 455 (*) 70 - 99 (mg/dL)    Comment 1 Notify RN     GLUCOSE, CAPILLARY     Status: Abnormal   Collection Time   07/28/11  6:35 PM      Component Value Range Comment   Glucose-Capillary 417 (*) 70 - 99 (mg/dL)    Comment 1 Notify RN     GLUCOSE, CAPILLARY     Status: Abnormal   Collection Time   07/28/11  8:18 PM      Component Value Range Comment   Glucose-Capillary 419 (*) 70 - 99 (mg/dL)   COMPREHENSIVE METABOLIC PANEL     Status: Abnormal   Collection Time   07/29/11 12:02 AM      Component Value Range Comment   Sodium 132 (*) 135 - 145 (mEq/L)    Potassium 4.2  3.5 - 5.1 (mEq/L)    Chloride 96  96 - 112 (mEq/L)    CO2 28  19 - 32 (mEq/L)    Glucose, Bld 430 (*) 70 - 99 (mg/dL)    BUN 19  6 - 23 (mg/dL)    Creatinine, Ser 1.61  0.50 - 1.10 (mg/dL)  Calcium 9.2  8.4 - 10.5 (mg/dL)    Total Protein 6.5  6.0 - 8.3 (g/dL)    Albumin 3.5  3.5 - 5.2 (g/dL)    AST 12  0 - 37 (U/L)    ALT 8  0 - 35 (U/L)    Alkaline Phosphatase 133 (*) 39 - 117 (U/L)    Total Bilirubin 0.3  0.3 - 1.2 (mg/dL)    GFR calc non Af Amer >90  >90 (mL/min)    GFR calc Af Amer >90  >90 (mL/min)   CBC     Status: Normal   Collection Time   07/29/11 12:23 AM      Component Value Range Comment   WBC 6.6  4.0 - 10.5 (K/uL)    RBC 4.53  3.87 - 5.11 (MIL/uL)    Hemoglobin 13.4  12.0 - 15.0 (g/dL)    HCT 13.0  86.5 - 78.4 (%)    MCV 87.9  78.0 - 100.0 (fL)    MCH 29.6  26.0 - 34.0 (pg)    MCHC 33.7  30.0 - 36.0 (g/dL)    RDW 69.6  29.5 - 28.4 (%)    Platelets 239  150 - 400 (K/uL)   DIFFERENTIAL     Status: Normal   Collection Time   07/29/11 12:23 AM      Component Value Range Comment   Neutrophils Relative 57  43 - 77 (%)    Neutro Abs 3.8  1.7 - 7.7 (K/uL)    Lymphocytes Relative 32  12 - 46 (%)    Lymphs Abs 2.1  0.7 - 4.0 (K/uL)    Monocytes Relative 7  3 - 12 (%)    Monocytes Absolute  0.5  0.1 - 1.0 (K/uL)    Eosinophils Relative 2  0 - 5 (%)    Eosinophils Absolute 0.2  0.0 - 0.7 (K/uL)    Basophils Relative 1  0 - 1 (%)    Basophils Absolute 0.1  0.0 - 0.1 (K/uL)   POCT I-STAT, CHEM 8     Status: Abnormal   Collection Time   07/29/11 12:33 AM      Component Value Range Comment   Sodium 134 (*) 135 - 145 (mEq/L)    Potassium 4.3  3.5 - 5.1 (mEq/L)    Chloride 98  96 - 112 (mEq/L)    BUN 20  6 - 23 (mg/dL)    Creatinine, Ser 1.32  0.50 - 1.10 (mg/dL)    Glucose, Bld 440 (*) 70 - 99 (mg/dL)    Calcium, Ion 1.02 (*) 1.12 - 1.32 (mmol/L)    TCO2 26  0 - 100 (mmol/L)    Hemoglobin 14.6  12.0 - 15.0 (g/dL)    HCT 72.5  36.6 - 44.0 (%)   GLUCOSE, CAPILLARY     Status: Abnormal   Collection Time   07/29/11  3:56 AM      Component Value Range Comment   Glucose-Capillary 263 (*) 70 - 99 (mg/dL)    Comment 1 Documented in Chart      Comment 2 Notify RN     GLUCOSE, CAPILLARY     Status: Abnormal   Collection Time   07/29/11  5:04 AM      Component Value Range Comment   Glucose-Capillary 173 (*) 70 - 99 (mg/dL)    Comment 1 Documented in Chart      Comment 2 Notify RN     GLUCOSE, CAPILLARY  Status: Abnormal   Collection Time   07/29/11  7:48 AM      Component Value Range Comment   Glucose-Capillary 116 (*) 70 - 99 (mg/dL)   URINALYSIS, ROUTINE W REFLEX MICROSCOPIC     Status: Normal   Collection Time   07/29/11  8:44 AM      Component Value Range Comment   Color, Urine YELLOW  YELLOW     Appearance CLEAR  CLEAR     Specific Gravity, Urine 1.008  1.005 - 1.030     pH 7.0  5.0 - 8.0     Glucose, UA NEGATIVE  NEGATIVE (mg/dL)    Hgb urine dipstick NEGATIVE  NEGATIVE     Bilirubin Urine NEGATIVE  NEGATIVE     Ketones, ur NEGATIVE  NEGATIVE (mg/dL)    Protein, ur NEGATIVE  NEGATIVE (mg/dL)    Urobilinogen, UA 0.2  0.0 - 1.0 (mg/dL)    Nitrite NEGATIVE  NEGATIVE     Leukocytes, UA NEGATIVE  NEGATIVE  MICROSCOPIC NOT DONE ON URINES WITH NEGATIVE PROTEIN, BLOOD,  LEUKOCYTES, NITRITE, OR GLUCOSE <1000 mg/dL.  GLUCOSE, CAPILLARY     Status: Abnormal   Collection Time   07/29/11 12:05 PM      Component Value Range Comment   Glucose-Capillary 324 (*) 70 - 99 (mg/dL)   CBC     Status: Normal   Collection Time   07/30/11  2:30 AM      Component Value Range Comment   WBC 7.4  4.0 - 10.5 (K/uL)    RBC 4.61  3.87 - 5.11 (MIL/uL)    Hemoglobin 13.9  12.0 - 15.0 (g/dL)    HCT 21.3  08.6 - 57.8 (%)    MCV 86.1  78.0 - 100.0 (fL)    MCH 30.2  26.0 - 34.0 (pg)    MCHC 35.0  30.0 - 36.0 (g/dL)    RDW 46.9  62.9 - 52.8 (%)    Platelets 224  150 - 400 (K/uL)   BASIC METABOLIC PANEL     Status: Abnormal   Collection Time   07/30/11  2:30 AM      Component Value Range Comment   Sodium 129 (*) 135 - 145 (mEq/L)    Potassium 4.2  3.5 - 5.1 (mEq/L)    Chloride 91 (*) 96 - 112 (mEq/L)    CO2 25  19 - 32 (mEq/L)    Glucose, Bld 459 (*) 70 - 99 (mg/dL)    BUN 18  6 - 23 (mg/dL)    Creatinine, Ser 4.13  0.50 - 1.10 (mg/dL)    Calcium 9.6  8.4 - 10.5 (mg/dL)    GFR calc non Af Amer >90  >90 (mL/min)    GFR calc Af Amer >90  >90 (mL/min)   URINALYSIS, ROUTINE W REFLEX MICROSCOPIC     Status: Abnormal   Collection Time   07/30/11  8:16 AM      Component Value Range Comment   Color, Urine YELLOW  YELLOW     Appearance CLEAR  CLEAR     Specific Gravity, Urine 1.024  1.005 - 1.030     pH 6.5  5.0 - 8.0     Glucose, UA >1000 (*) NEGATIVE (mg/dL)    Hgb urine dipstick NEGATIVE  NEGATIVE     Bilirubin Urine NEGATIVE  NEGATIVE     Ketones, ur 40 (*) NEGATIVE (mg/dL)    Protein, ur NEGATIVE  NEGATIVE (mg/dL)    Urobilinogen, UA 0.2  0.0 - 1.0 (mg/dL)    Nitrite NEGATIVE  NEGATIVE     Leukocytes, UA NEGATIVE  NEGATIVE    URINE MICROSCOPIC-ADD ON     Status: Normal   Collection Time   07/30/11  8:16 AM      Component Value Range Comment   Squamous Epithelial / LPF RARE  RARE     WBC, UA 0-2  <3 (WBC/hpf)    RBC / HPF 0-2  <3 (RBC/hpf)     Radiological Exams on  Admission: No results found.  Assessment/Plan Principal Problem:   *Diabetes mellitus - continue Lantus 40 units at bedtime. Start novolog 12 Units TID AC and continue sliding scale insulin. Continue CBG monitor every 4 hours.  Active Problems:   Depression - continue psychiatric medications as per psych recommendations.   HTN (hypertension) - continue lisinopril, we will let Norvasc 10 mg daily.  Disposition - social work involvement for safe discharge plan (home versus behavioral health). We'll obtain PT/OT evaluation.   Time Spent on Admission: 30 minutes.  Joyel Chenette 07/30/2011, 9:38 AM

## 2011-07-31 LAB — BASIC METABOLIC PANEL
BUN: 17 mg/dL (ref 6–23)
BUN: 17 mg/dL (ref 6–23)
CO2: 28 mEq/L (ref 19–32)
Chloride: 95 mEq/L — ABNORMAL LOW (ref 96–112)
Creatinine, Ser: 0.81 mg/dL (ref 0.50–1.10)
GFR calc Af Amer: >90 mL/min (ref >90)
GFR calc non Af Amer: 82 mL/min — ABNORMAL LOW (ref >90)
Glucose, Bld: 74 mg/dL (ref 70–99)
Potassium: 3.5 mEq/L (ref 3.5–5.1)
Sodium: 136 mEq/L (ref 135–145)

## 2011-07-31 LAB — GLUCOSE, CAPILLARY
Glucose-Capillary: 117 mg/dL — ABNORMAL HIGH (ref 70–99)
Glucose-Capillary: 304 mg/dL — ABNORMAL HIGH (ref 70–99)
Glucose-Capillary: 51 mg/dL — ABNORMAL LOW (ref 70–99)
Glucose-Capillary: 52 mg/dL — ABNORMAL LOW (ref 70–99)

## 2011-07-31 LAB — CBC
HCT: 36 % (ref 36.0–46.0)
Hemoglobin: 12.4 g/dL (ref 12.0–15.0)
MCHC: 34.4 g/dL (ref 30.0–36.0)
RBC: 4.18 MIL/uL (ref 3.87–5.11)

## 2011-07-31 MED ORDER — INSULIN GLARGINE 100 UNIT/ML ~~LOC~~ SOLN
20.0000 [IU] | Freq: Every day | SUBCUTANEOUS | Status: DC
  Administered 2011-07-31: 20 [IU] via SUBCUTANEOUS

## 2011-07-31 MED ORDER — INSULIN ASPART 100 UNIT/ML ~~LOC~~ SOLN
0.0000 [IU] | SUBCUTANEOUS | Status: DC
  Administered 2011-07-31: 9 [IU] via SUBCUTANEOUS
  Administered 2011-07-31: 5 [IU] via SUBCUTANEOUS
  Administered 2011-08-01: 9 [IU] via SUBCUTANEOUS
  Administered 2011-08-01: 0 [IU] via SUBCUTANEOUS
  Administered 2011-08-01: 1 [IU] via SUBCUTANEOUS
  Administered 2011-08-01: 5 [IU] via SUBCUTANEOUS
  Administered 2011-08-01 – 2011-08-02 (×2): 1 [IU] via SUBCUTANEOUS
  Administered 2011-08-02: 3 [IU] via SUBCUTANEOUS
  Administered 2011-08-02: 0 [IU] via SUBCUTANEOUS
  Administered 2011-08-02: 9 [IU] via SUBCUTANEOUS
  Administered 2011-08-03: 1 [IU] via SUBCUTANEOUS
  Administered 2011-08-03: 2 [IU] via SUBCUTANEOUS
  Administered 2011-08-03: 5 [IU] via SUBCUTANEOUS
  Administered 2011-08-03: 0 [IU] via SUBCUTANEOUS
  Administered 2011-08-04 (×2): 5 [IU] via SUBCUTANEOUS
  Administered 2011-08-04: 2 [IU] via SUBCUTANEOUS
  Administered 2011-08-04: 7 [IU] via SUBCUTANEOUS
  Administered 2011-08-04: 5 [IU] via SUBCUTANEOUS
  Administered 2011-08-05: 9 [IU] via SUBCUTANEOUS
  Administered 2011-08-05: 2 [IU] via SUBCUTANEOUS
  Administered 2011-08-05 (×2): 3 [IU] via SUBCUTANEOUS

## 2011-07-31 MED ORDER — INSULIN GLARGINE 100 UNIT/ML ~~LOC~~ SOLN
10.0000 [IU] | Freq: Once | SUBCUTANEOUS | Status: AC
  Administered 2011-07-31: 10 [IU] via SUBCUTANEOUS

## 2011-07-31 MED ORDER — INSULIN GLARGINE 100 UNIT/ML ~~LOC~~ SOLN
30.0000 [IU] | Freq: Every day | SUBCUTANEOUS | Status: DC
  Administered 2011-08-01: 30 [IU] via SUBCUTANEOUS

## 2011-07-31 MED ORDER — INSULIN ASPART 100 UNIT/ML ~~LOC~~ SOLN
5.0000 [IU] | Freq: Three times a day (TID) | SUBCUTANEOUS | Status: DC
  Administered 2011-07-31 – 2011-08-02 (×4): 5 [IU] via SUBCUTANEOUS

## 2011-07-31 NOTE — Progress Notes (Signed)
Subjective:  No events overnight.  Objective:  Vital signs in last 24 hours:  Filed Vitals:   07/30/11 1550 07/30/11 2125 07/31/11 0100 07/31/11 0614  BP: 124/75 151/89  108/68  Pulse: 87 92  79  Temp: 98.4 F (36.9 C) 98.7 F (37.1 C)  97.5 F (36.4 C)  TempSrc: Oral Oral  Oral  Resp: 16 20  18   Height:   5\' 2"  (1.575 m)   Weight:   74.844 kg (165 lb)   SpO2: 91% 93%  96%    Intake/Output from previous day:   Intake/Output Summary (Last 24 hours) at 07/31/11 1024 Last data filed at 07/31/11 0951  Gross per 24 hour  Intake    700 ml  Output   1075 ml  Net   -375 ml    Physical Exam: General: Alert, awake, oriented x3, in no acute distress. HEENT: No bruits, no goiter. Heart: Regular rate and rhythm, without murmurs, rubs, gallops. Lungs: Clear to auscultation bilaterally. Abdomen: Soft, nontender, nondistended, positive bowel sounds. Extremities: No clubbing cyanosis or edema with positive pedal pulses. Neuro: Grossly intact, nonfocal.   Lab Results:  Basic Metabolic Panel:    Component Value Date/Time   NA 136 07/31/2011 0355   K 3.5 07/31/2011 0355   CL 104 07/31/2011 0355   CO2 28 07/31/2011 0355   BUN 17 07/31/2011 0355   CREATININE 0.69 07/31/2011 0355   GLUCOSE 74 07/31/2011 0355   CALCIUM 9.1 07/31/2011 0355   CBC:    Component Value Date/Time   WBC 7.8 07/31/2011 0355   HGB 12.4 07/31/2011 0355   HCT 36.0 07/31/2011 0355   PLT 242 07/31/2011 0355   MCV 86.1 07/31/2011 0355   NEUTROABS 3.8 07/29/2011 0023   LYMPHSABS 2.1 07/29/2011 0023   MONOABS 0.5 07/29/2011 0023   EOSABS 0.2 07/29/2011 0023   BASOSABS 0.1 07/29/2011 0023    No results found for this or any previous visit (from the past 240 hour(s)).  Studies/Results: Dg Lumbar Spine Complete  07/30/2011  IMPRESSION: Grade 2 anterolisthesis of L4 on L5.  25% height loss at L1.  These findings are unchanged from the comparison study.  Multilevel degenerative change.  Original Report Authenticated  By: Waneta Martins, M.D.   Dg Hip Complete Right  07/30/2011  IMPRESSION: No acute osseous abnormality identified.  If clinical concern for a nondisplaced right hip fracture persists, recommend MRI.  Original Report Authenticated By: Waneta Martins, M.D.    Medications: Scheduled Meds:   . amLODipine  10 mg Oral Daily  . citalopram  20 mg Oral QHS  . DULoxetine  60 mg Oral Daily  . enalapril  20 mg Oral Daily  . enoxaparin  40 mg Subcutaneous Q24H  . insulin aspart  0-15 Units Subcutaneous Q4H  . metoCLOPramide  10 mg Oral TID AC  . traZODone  100 mg Oral QHS  . DISCONTD: insulin aspart  0-15 Units Subcutaneous Q4H  . DISCONTD: insulin aspart  12 Units Subcutaneous TID WC  . DISCONTD: insulin glargine  40 Units Subcutaneous QHS   Continuous Infusions:   . sodium chloride 75 mL/hr at 07/31/11 1020  . dextrose 5 % and 0.45% NaCl    . DISCONTD: sodium chloride 1,000 mL (07/30/11 1402)   PRN Meds:.acetaminophen, acetaminophen, ALPRAZolam, dextrose, HYDROcodone-acetaminophen, ondansetron (ZOFRAN) IV, ondansetron, senna-docusate  Assessment/Plan:  Principal Problem:  *Diabetes mellitus - difficult to establish control, given multiple episodes of hypoglycemia yesterday and only one CBG 350 and other readings <50  we will start SSI for now and will order basal insulin once we get a better idea of sugar control. Encourage PO intake. Active Problems:  Depression - continue medications per psych recommendations  HTN (hypertension) - at goal for now    LOS: 1 day   Azusena Erlandson 07/31/2011, 10:24 AM

## 2011-07-31 NOTE — H&P (Signed)
PCP:  No primary provider on file.   DOA:  07/30/2011 11:04 AM  Chief Complaint:  Hypoglycemia  HPI: Pt was transferred from [facility] department to [facility] ED for continuation of care after she has fell overnight at the [facility]. Pt denies any chest pain or SOB, no abdominal or urinary concerns. She denies focal weakness or dizziness, no headaches, no syncopal episodes in the past. No recent sicknesses or hospitalizations, no fever, chills, or other systemic concerns.  Allergies: Allergies  Allergen Reactions  . Celecoxib Nausea Only  . Ibuprofen Nausea Only    Prior to Admission medications   Medication Sig Start Date End Date Taking? Authorizing Provider  ALPRAZolam Prudy Feeler) 0.5 MG tablet Take 0.5 mg by mouth 2 (two) times daily as needed. For anxiety    Historical Provider, MD  citalopram (CELEXA) 20 MG tablet Take 20 mg by mouth at bedtime.     Historical Provider, MD  DULoxetine (CYMBALTA) 60 MG capsule Take 60 mg by mouth daily.     Historical Provider, MD  enalapril (VASOTEC) 20 MG tablet Take 20 mg by mouth daily.     Historical Provider, MD  HYDROcodone-acetaminophen (VICODIN) 5-500 MG per tablet Take 1 tablet by mouth daily as needed. For pain    Historical Provider, MD  insulin glargine (LANTUS) 100 UNIT/ML injection Inject 40 Units into the skin at bedtime.     Historical Provider, MD  insulin lispro (HUMALOG) 100 UNIT/ML injection Inject 5 Units into the skin 3 (three) times daily before meals. As needed for every 300    Historical Provider, MD  metoCLOPramide (REGLAN) 10 MG tablet Take 10 mg by mouth 3 (three) times daily before meals.     Historical Provider, MD  traZODone (DESYREL) 100 MG tablet Take 100 mg by mouth at bedtime.     Historical Provider, MD    Past Medical History  Diagnosis Date  . Diabetes mellitus   . Hypertension   . Anemia   . Chronic back pain   . Depression   . Neuropathy   . Hypercholesterolemia     No past surgical history on file.  Social History:  does not have a smoking history on file. She does not have any smokeless tobacco history on file. She reports that she does not drink alcohol or use illicit drugs.  No family history on file.  Review of Systems:  Constitutional: Denies fever, chills, diaphoresis, appetite change and fatigue.  HEENT: Denies photophobia, eye pain, redness, hearing loss, ear pain, congestion, sore throat, rhinorrhea, sneezing, mouth sores, trouble swallowing, neck pain, neck stiffness and tinnitus.   Respiratory: Denies SOB, DOE, cough, chest tightness,  and wheezing.   Cardiovascular: Denies chest pain, palpitations and leg swelling.  Gastrointestinal: Denies nausea, vomiting, abdominal pain, diarrhea, constipation, blood in stool and abdominal distention.  Genitourinary: Denies dysuria, urgency, frequency, hematuria, flank pain and difficulty urinating.  Musculoskeletal: Denies myalgias, back pain, joint swelling, arthralgias and gait problem.  Skin: Denies pallor, rash and wound.  Neurological: Denies dizziness, seizures, syncope, weakness, light-headedness, numbness and headaches.  Hematological: Denies adenopathy. Easy bruising, personal or family bleeding history  Psychiatric/Behavioral: Denies suicidal ideation, mood changes, confusion, nervousness, sleep disturbance and agitation   Physical Exam:  Filed Vitals:   07/30/11 1550 07/30/11 2125 07/31/11 0100 07/31/11 0614  BP: 124/75 151/89  108/68  Pulse: 87 92  79  Temp: 98.4 F (36.9 C) 98.7 F (37.1 C)  97.5 F (36.4 C)  TempSrc: Oral Oral  Oral  Resp:  16 20  18   Height:   5\' 2"  (1.575 m)   Weight:   74.844 kg (165 lb)   SpO2: 91% 93%  96%    Constitutional: Vital signs reviewed.  Patient is a well-developed and well-nourished in no acute distress and cooperative with exam. Alert and oriented x3.  Head: Normocephalic and atraumatic Ear: TM normal bilaterally Mouth: no erythema or exudates, MMM Eyes: PERRL, EOMI, conjunctivae normal, No  scleral icterus.  Neck: Supple, Trachea midline normal ROM, No JVD, mass, thyromegaly, or carotid bruit present.  Cardiovascular: RRR, S1 normal, S2 normal, no MRG, pulses symmetric and intact bilaterally Pulmonary/Chest: CTAB, no wheezes, rales, or rhonchi Abdominal: Soft. Non-tender, non-distended, bowel sounds are normal, no masses, organomegaly, or guarding present.  GU: no CVA tenderness Musculoskeletal: No joint deformities, erythema, or stiffness, ROM full and no nontender Ext: no edema and no cyanosis, pulses palpable bilaterally (DP and PT) Hematology: no cervical, inginal, or axillary adenopathy.  Neurological: A&O x3, Strenght is normal and symmetric bilaterally, cranial nerve II-XII are grossly intact, no focal motor deficit, sensory intact to light touch bilaterally.  Skin: Warm, dry and intact. No rash, cyanosis, or clubbing.  Psychiatric: Normal mood and affect. speech and behavior is normal. Judgment and thought content normal. Cognition and memory are normal.   Labs on Admission:  Results for orders placed during the hospital encounter of 07/30/11 (from the past 48 hour(s))  CBC     Status: Normal   Collection Time   07/30/11 12:15 PM      Component Value Range Comment   WBC 7.7  4.0 - 10.5 (K/uL)    RBC 4.37  3.87 - 5.11 (MIL/uL)    Hemoglobin 13.1  12.0 - 15.0 (g/dL)    HCT 16.1  09.6 - 04.5 (%)    MCV 84.7  78.0 - 100.0 (fL)    MCH 30.0  26.0 - 34.0 (pg)    MCHC 35.4  30.0 - 36.0 (g/dL)    RDW 40.9  81.1 - 91.4 (%)    Platelets 238  150 - 400 (K/uL)   CREATININE, SERUM     Status: Normal   Collection Time   07/30/11 12:15 PM      Component Value Range Comment   Creatinine, Ser 0.60  0.50 - 1.10 (mg/dL)    GFR calc non Af Amer >90  >90 (mL/min)    GFR calc Af Amer >90  >90 (mL/min)   TSH     Status: Normal   Collection Time   07/30/11 12:15 PM      Component Value Range Comment   TSH 2.266  0.350 - 4.500 (uIU/mL)   GLUCOSE, CAPILLARY     Status: Abnormal    Collection Time   07/30/11  1:00 PM      Component Value Range Comment   Glucose-Capillary 219 (*) 70 - 99 (mg/dL)    Comment 1 Documented in Chart      Comment 2 Notify RN     GLUCOSE, CAPILLARY     Status: Abnormal   Collection Time   07/30/11  3:44 PM      Component Value Range Comment   Glucose-Capillary 24 (*) 70 - 99 (mg/dL)    Comment 1 Notify RN     GLUCOSE, CAPILLARY     Status: Abnormal   Collection Time   07/30/11  3:48 PM      Component Value Range Comment   Glucose-Capillary 23 (*) 70 - 99 (mg/dL)  Comment 1 Notify RN     GLUCOSE, CAPILLARY     Status: Abnormal   Collection Time   07/30/11  4:03 PM      Component Value Range Comment   Glucose-Capillary 150 (*) 70 - 99 (mg/dL)   GLUCOSE, CAPILLARY     Status: Abnormal   Collection Time   07/30/11  5:22 PM      Component Value Range Comment   Glucose-Capillary 40 (*) 70 - 99 (mg/dL)   GLUCOSE, CAPILLARY     Status: Abnormal   Collection Time   07/30/11  5:46 PM      Component Value Range Comment   Glucose-Capillary 37 (*) 70 - 99 (mg/dL)    Comment 1 Notify RN     GLUCOSE, CAPILLARY     Status: Abnormal   Collection Time   07/30/11  6:41 PM      Component Value Range Comment   Glucose-Capillary 162 (*) 70 - 99 (mg/dL)   GLUCOSE, CAPILLARY     Status: Abnormal   Collection Time   07/30/11  8:05 PM      Component Value Range Comment   Glucose-Capillary 367 (*) 70 - 99 (mg/dL)   GLUCOSE, CAPILLARY     Status: Abnormal   Collection Time   07/30/11 10:03 PM      Component Value Range Comment   Glucose-Capillary 440 (*) 70 - 99 (mg/dL)   GLUCOSE, CAPILLARY     Status: Abnormal   Collection Time   07/31/11 12:13 AM      Component Value Range Comment   Glucose-Capillary 304 (*) 70 - 99 (mg/dL)    Comment 1 Notify RN     CBC     Status: Normal   Collection Time   07/31/11  3:55 AM      Component Value Range Comment   WBC 7.8  4.0 - 10.5 (K/uL)    RBC 4.18  3.87 - 5.11 (MIL/uL)    Hemoglobin 12.4  12.0 - 15.0  (g/dL)    HCT 16.1  09.6 - 04.5 (%)    MCV 86.1  78.0 - 100.0 (fL)    MCH 29.7  26.0 - 34.0 (pg)    MCHC 34.4  30.0 - 36.0 (g/dL)    RDW 40.9  81.1 - 91.4 (%)    Platelets 242  150 - 400 (K/uL)   BASIC METABOLIC PANEL     Status: Normal   Collection Time   07/31/11  3:55 AM      Component Value Range Comment   Sodium 136  135 - 145 (mEq/L) DELTA CHECK NOTED   Potassium 3.5  3.5 - 5.1 (mEq/L)    Chloride 104  96 - 112 (mEq/L) DELTA CHECK NOTED   CO2 28  19 - 32 (mEq/L)    Glucose, Bld 74  70 - 99 (mg/dL)    BUN 17  6 - 23 (mg/dL)    Creatinine, Ser 7.82  0.50 - 1.10 (mg/dL)    Calcium 9.1  8.4 - 10.5 (mg/dL)    GFR calc non Af Amer >90  >90 (mL/min)    GFR calc Af Amer >90  >90 (mL/min)   GLUCOSE, CAPILLARY     Status: Abnormal   Collection Time   07/31/11  5:31 AM      Component Value Range Comment   Glucose-Capillary 51 (*) 70 - 99 (mg/dL)    Comment 1 Notify RN     GLUCOSE, CAPILLARY     Status:  Abnormal   Collection Time   07/31/11  6:05 AM      Component Value Range Comment   Glucose-Capillary 52 (*) 70 - 99 (mg/dL)    Comment 1 Notify RN     GLUCOSE, CAPILLARY     Status: Abnormal   Collection Time   07/31/11  6:27 AM      Component Value Range Comment   Glucose-Capillary 117 (*) 70 - 99 (mg/dL)    Comment 1 Notify RN     GLUCOSE, CAPILLARY     Status: Abnormal   Collection Time   07/31/11  7:39 AM      Component Value Range Comment   Glucose-Capillary 370 (*) 70 - 99 (mg/dL)     Radiological Exams on Admission: No results found.  Assessment/Plan Principal Problem:  *Diabetes mellitus - uncontrolled, continue SSI for now until figure out insulin requirements. Pt has persistent hypoglycemia so will have to observe carefully.  Active Problems:  Depression - continue medications per psych recommendations.  HTN (hypertension) - currently well controlled   Time Spent on Admission: Over 30 minutes  Henri Baumler 07/31/2011, 12:39 PM

## 2011-07-31 NOTE — Progress Notes (Signed)
Suspect hypoglycemia secondary to 26 units Novolog within 2 hours at [place].  Recommend restarting Lantus and titrate until FBS < 140 mg/dL.  Recommend meal coverage insulin - Novolog 4 units tid.  Will follow.

## 2011-07-31 NOTE — Progress Notes (Signed)
Occupational Therapy Evaluation Patient Details Name: [name] MRN: [id number] DOB: 02-May-1961 Today's Date: 07/31/2011 1030-1050 Eval 2  Problem List:  Patient Active Problem List  Diagnoses  . Diabetes mellitus  . Depression  . HTN (hypertension)    Past Medical History:  Past Medical History  Diagnosis Date  . Diabetes mellitus   . Hypertension   . Anemia   . Chronic back pain   . Depression   . Neuropathy   . Hypercholesterolemia    Past Surgical History: No past surgical history on file.  OT Assessment/Plan/Recommendation OT Assessment Clinical Impression Statement: Pt would benefit from a few sessions of OT to achieve a mod I level of performance with ADLS and ADL transfers OT Recommendation/Assessment: Patient will need skilled OT in the acute care venue OT Problem List: Decreased strength;Decreased activity tolerance;Decreased range of motion;Impaired balance (sitting and/or standing) OT Therapy Diagnosis : Generalized weakness;Acute pain OT Plan OT Frequency: Min 2X/week OT Treatment/Interventions: Self-care/ADL training;Therapeutic activities OT Recommendation Follow Up Recommendations: None Equipment Recommended: Rolling walker with 5" wheels Individuals Consulted Consulted and Agree with Results and Recommendations: Patient OT Goals Acute Rehab OT Goals OT Goal Formulation: With patient Time For Goal Achievement: 7 days ADL Goals Pt Will Perform Grooming: with modified independence;Standing at sink ADL Goal: Grooming - Progress: Other (comment) Pt Will Perform Lower Body Bathing: with modified independence;Sit to stand from chair ADL Goal: Lower Body Bathing - Progress: Other (comment) Pt Will Perform Lower Body Dressing: with modified independence;Sit to stand from chair ADL Goal: Lower Body Dressing - Progress: Other (comment) Pt Will Transfer to Toilet: with modified independence;Regular height toilet (w grab bars) ADL Goal: Toilet Transfer -  Progress: Other (comment) Pt Will Perform Toileting - Clothing Manipulation: with modified independence;Standing ADL Goal: Toileting - Clothing Manipulation - Progress: Other (comment) Pt Will Perform Toileting - Hygiene: with modified independence;Sit to stand from 3-in-1/toilet ADL Goal: Toileting - Hygiene - Progress: Other (comment) Pt Will Perform Tub/Shower Transfer: with supervision;Shower seat without back ADL Goal: Web designer - Progress: Other (comment)  OT Evaluation Precautions/Restrictions  Restrictions Weight Bearing Restrictions: No Prior Functioning Home Living Lives With: Spouse Receives Help From: Family Type of Home: Apartment Home Layout: One level Home Access: Level entry Bathroom Shower/Tub: Tub/shower unit;Curtain Firefighter: Standard Home Adaptive Equipment: Shower chair without back;Grab bars around toilet;Grab bars in shower Prior Function Level of Independence: Independent with basic ADLs;Other (comment) (was taking appx 1 fall a month) Able to Take Stairs?: Yes Driving: No Vocation: On disability ADL ADL Eating/Feeding: Performed;Independent Where Assessed - Eating/Feeding: Edge of bed Grooming: Simulated;Wash/dry hands;Supervision/safety Where Assessed - Grooming: Standing at sink Upper Body Bathing: Simulated;Chest;Right arm;Left arm;Abdomen;Supervision/safety Where Assessed - Upper Body Bathing: Sitting, bed Lower Body Bathing: Simulated;Minimal assistance (min guard when standing) Where Assessed - Lower Body Bathing: Sit to stand from bed Upper Body Dressing: Simulated;Set up Where Assessed - Upper Body Dressing: Sitting, bed Lower Body Dressing: Performed;Minimal assistance Lower Body Dressing Details (indicate cue type and reason): Pt w posterior lean when sitting requiring assist to maintain sitting Where Assessed - Lower Body Dressing: Sitting, bed Toilet Transfer: Simulated;Minimal assistance Toilet Transfer Method:  Ambulating Toilet Transfer Equipment: Regular height toilet;Grab bars Toileting - Clothing Manipulation: Minimal assistance (min guard for balance) Where Assessed - Toileting Clothing Manipulation: Standing Toileting - Hygiene: Minimal assistance Where Assessed - Toileting Hygiene: Standing Tub/Shower Transfer: Not assessed Tub/Shower Transfer Method: Not assessed Vision/Perception  Vision - History Baseline Vision: No visual deficits Patient Visual Report:  No change from baseline Vision - Assessment Eye Alignment: Within Functional Limits Vision Assessment: Vision not tested Cognition Cognition Arousal/Alertness: Awake/alert Overall Cognitive Status: Appears within functional limits for tasks assessed Cognition - Other Comments: pt mildly impulsive when up on feet. Sensation/Coordination Sensation Light Touch: Appears Intact Coordination Gross Motor Movements are Fluid and Coordinated: Yes Fine Motor Movements are Fluid and Coordinated: Yes Extremity Assessment RUE Assessment RUE Assessment: Within Functional Limits LUE Assessment LUE Assessment: Exceptions to WFL LUE AROM (degrees) LUE Overall AROM Comments: Pt w old L shoulder fx.  Flexion/abduction limited to 90 Mobility  Bed Mobility Bed Mobility: Yes Supine to Sit: 6: Modified independent (Device/Increase time);HOB elevated (Comment degrees) (HOB 30 degrees) Sit to Supine - Left: 7: Independent Transfers Transfers: Yes Sit to Stand: 5: Supervision Sit to Stand Details (indicate cue type and reason): cues for safety Stand to Sit: 5: Supervision;To bed Exercises   End of Session OT - End of Session Equipment Utilized During Treatment: Gait belt Activity Tolerance: Patient limited by fatigue Patient left: in bed;with call bell in reach Nurse Communication: Mobility status for ambulation General Behavior During Session: [facility] for tasks performed Cognition: [facility] for tasks performed   Hope Budds 07/31/2011,  11:26 AM

## 2011-07-31 NOTE — Progress Notes (Signed)
Physical Therapy Evaluation Patient Details Name: [name] MRN: [id number] DOB: 05-02-1961 Today's Date: 07/31/2011 10:30-10:50, EV2  Problem List:  Patient Active Problem List  Diagnoses  . Diabetes mellitus  . Depression  . HTN (hypertension)    Past Medical History:  Past Medical History  Diagnosis Date  . Diabetes mellitus   . Hypertension   . Anemia   . Chronic back pain   . Depression   . Neuropathy   . Hypercholesterolemia    Past Surgical History: No past surgical history on file.  PT Assessment/Plan/Recommendation PT Assessment Clinical Impression Statement: Pt presents with back pain and abnormal gait pattern which is baseline for pt, but is not very safe.  Pt would benefit from PT to work towards improving gait pattern with use of RW.  Pt reports she has had RW in past and like it,but hers has broken.   PT Recommendation/Assessment: Patient will need skilled PT in the acute care venue PT Problem List: Decreased balance;Decreased coordination;Decreased mobility;Decreased knowledge of use of DME PT Therapy Diagnosis : Abnormality of gait PT Plan PT Frequency: Min 3X/week PT Treatment/Interventions: Balance training;Therapeutic exercise;Functional mobility training;Gait training;DME instruction PT Recommendation Follow Up Recommendations: Other (comment) (Return to [employer]) Equipment Recommended: Rolling walker with 5" wheels PT Goals  Acute Rehab PT Goals PT Goal Formulation: With patient Time For Goal Achievement: 7 days Pt will go Supine/Side to Sit: Independently PT Goal: Supine/Side to Sit - Progress: Progressing toward goal Pt will Transfer Sit to Stand/Stand to Sit: Independently PT Transfer Goal: Sit to Stand/Stand to Sit - Progress: Progressing toward goal Pt will Ambulate: >150 feet;with modified independence PT Goal: Ambulate - Progress: Progressing toward goal  PT Evaluation Precautions/Restrictions    Prior Functioning  Home  Living Lives With: Spouse Receives Help From: Family Type of Home: Apartment Home Layout: One level Home Access: Level entry Bathroom Shower/Tub: Tub/shower unit;Curtain Firefighter: Standard Home Adaptive Equipment: Shower chair without back;Grab bars around toilet;Grab bars in shower Prior Function Level of Independence: Independent with basic ADLs;Other (comment) (was taking appx 1 fall a month) Able to Take Stairs?: Yes Driving: No Vocation: On disability Cognition Cognition Arousal/Alertness: Awake/alert Overall Cognitive Status: Appears within functional limits for tasks assessed Cognition - Other Comments: pt mildly impulsive when up on feet. Sensation/Coordination Sensation Light Touch: Appears Intact Coordination Gross Motor Movements are Fluid and Coordinated: Yes Fine Motor Movements are Fluid and Coordinated: Yes Extremity Assessment RUE Assessment RUE Assessment: Within Functional Limits LUE Assessment LUE Assessment: Exceptions to WFL LUE AROM (degrees) LUE Overall AROM Comments: Pt w old L shoulder fx.  Flexion/abduction limited to 90 RLE Assessment RLE Assessment: Within Functional Limits LLE Assessment LLE Assessment: Within Functional Limits Mobility (including Balance) Bed Mobility Bed Mobility: Yes Supine to Sit: 6: Modified independent (Device/Increase time);HOB elevated (Comment degrees) (HOB 30 degrees) Sit to Supine - Left: 7: Independent Transfers Sit to Stand: 5: Supervision Sit to Stand Details (indicate cue type and reason): cues for safety Stand to Sit: 5: Supervision;To bed Ambulation/Gait Ambulation/Gait: Yes Ambulation/Gait Assistance: 4: Min assist Ambulation/Gait Assistance Details (indicate cue type and reason): MIN/guard Ambulation Distance (Feet): 120 Feet Assistive device: Other (Comment);None (used IV pole for first 20 feet) Gait Pattern: Decreased step length - left;Decreased step length - right (Walks on toes)   Balance Balance Assessed: Yes Dynamic Sitting Balance Dynamic Sitting - Comments: With MMTs, pt with decreased sitting balance Exercise    End of Session PT - End of Session Equipment Utilized During  Treatment: Gait belt Activity Tolerance: Patient tolerated treatment well Patient left: in bed;Other (comment) (with Recruitment consultant) Nurse Communication: Mobility status for ambulation General Behavior During Session: [facility] for tasks performed Cognition: [facility] for tasks performed  [facility] LUBECK 07/31/2011, 11:25 AM

## 2011-07-31 NOTE — Consults (Signed)
Reason for consultation: Hyperglycemia  History of Present Illness: PT admitted to [facility] for treatment of depression, now with uncontrolled diabetes and persistent hyperglycemia. She denies any chest pain or shortness of breath, no abdominal or urinary concerns. Reports feeling well, no weakness or dizziness, no headaches or visual changes, no other systemic concerns.  Allergies:   Allergies  Allergen Reactions  . Celecoxib Nausea Only  . Ibuprofen Nausea Only      Past Medical History  Diagnosis Date  . Diabetes mellitus   . Hypertension   . Anemia   . Chronic back pain   . Depression   . Neuropathy   . Hypercholesterolemia     No past surgical history on file.  Medications:  Scheduled Meds:   . amLODipine  10 mg Oral Daily  . citalopram  20 mg Oral QHS  . DULoxetine  60 mg Oral Daily  . enalapril  20 mg Oral Daily  . enoxaparin  40 mg Subcutaneous Q24H  . insulin aspart  0-15 Units Subcutaneous Q4H  . metoCLOPramide  10 mg Oral TID AC  . traZODone  100 mg Oral QHS  . DISCONTD: insulin aspart  0-15 Units Subcutaneous Q4H  . DISCONTD: insulin aspart  12 Units Subcutaneous TID WC  . DISCONTD: insulin glargine  40 Units Subcutaneous QHS   Continuous Infusions:   . sodium chloride 75 mL/hr at 07/31/11 1020  . DISCONTD: sodium chloride 1,000 mL (07/30/11 1402)  . DISCONTD: dextrose 5 % and 0.45% NaCl     PRN Meds:.acetaminophen, acetaminophen, ALPRAZolam, dextrose, HYDROcodone-acetaminophen, ondansetron (ZOFRAN) IV, ondansetron, senna-docusate  Social History:  does not have a smoking history on file. She does not have any smokeless tobacco history on file. She reports that she does not drink alcohol or use illicit drugs.  No family history on file.  Review of Systems:  Constitutional: Denies fever, chills, diaphoresis, appetite change and fatigue.  HEENT: Denies photophobia, eye pain, redness, hearing loss, ear pain, congestion, sore throat, rhinorrhea, sneezing,  mouth sores, trouble swallowing, neck pain, neck stiffness and tinnitus.   Respiratory: Denies SOB, DOE, cough, chest tightness,  and wheezing.   Cardiovascular: Denies chest pain, palpitations and leg swelling.  Gastrointestinal: Denies nausea, vomiting, abdominal pain, diarrhea, constipation, blood in stool and abdominal distention.  Genitourinary: Denies dysuria, urgency, frequency, hematuria, flank pain and difficulty urinating.  Musculoskeletal: Denies myalgias, back pain, joint swelling, arthralgias and gait problem.  Skin: Denies pallor, rash and wound.  Neurological: Denies dizziness, seizures, syncope, weakness, light-headedness, numbness and headaches.  Hematological: Denies adenopathy. Easy bruising, personal or family bleeding history  Psychiatric/Behavioral: Denies suicidal ideation, mood changes, confusion, nervousness, sleep disturbance and agitation   Physical Exam:  Filed Vitals:   07/30/11 1550 07/30/11 2125 07/31/11 0100 07/31/11 0614  BP: 124/75 151/89  108/68  Pulse: 87 92  79  Temp: 98.4 F (36.9 C) 98.7 F (37.1 C)  97.5 F (36.4 C)  TempSrc: Oral Oral  Oral  Resp: 16 20  18   Height:   5\' 2"  (1.575 m)   Weight:   74.844 kg (165 lb)   SpO2: 91% 93%  96%     Intake/Output Summary (Last 24 hours) at 07/31/11 1243 Last data filed at 07/31/11 1100  Gross per 24 hour  Intake    700 ml  Output   2175 ml  Net  -1475 ml    General: Alert, awake, oriented x3, in no acute distress. HEENT: No bruits, no goiter. Heart: Regular rate and rhythm,  without murmurs, rubs, gallops. Lungs: Clear to auscultation bilaterally. Abdomen: Soft, nontender, nondistended, positive bowel sounds. Extremities: No clubbing cyanosis or edema with positive pedal pulses. Neuro: Grossly intact, nonfocal.  Labs on Admission:  CBC:    Component Value Date/Time   WBC 7.8 07/31/2011 0355   HGB 12.4 07/31/2011 0355   HCT 36.0 07/31/2011 0355   PLT 242 07/31/2011 0355   MCV 86.1  07/31/2011 0355   NEUTROABS 3.8 07/29/2011 0023   LYMPHSABS 2.1 07/29/2011 0023   MONOABS 0.5 07/29/2011 0023   EOSABS 0.2 07/29/2011 0023   BASOSABS 0.1 07/29/2011 0023    Basic Metabolic Panel:    Component Value Date/Time   NA 136 07/31/2011 0355   K 3.5 07/31/2011 0355   CL 104 07/31/2011 0355   CO2 28 07/31/2011 0355   BUN 17 07/31/2011 0355   CREATININE 0.69 07/31/2011 0355   GLUCOSE 74 07/31/2011 0355   CALCIUM 9.1 07/31/2011 0355    Radiological Exams on Admission: Dg Lumbar Spine Complete  07/30/2011  *RADIOLOGY REPORT*  Clinical Data: Recent fall, back pain.  LUMBAR SPINE - COMPLETE 4+ VIEW  Comparison: 03/20/2010  Findings:  There is grade 2 anterolisthesis of L4-L5.  This is similar to prior.  There is a proximally 25% height loss of the L1 vertebral body anteriorly.  This is unchanged from the prior.  No definite acute fracture or dislocation identified.  Multilevel degenerative changes and facet arthropathy.  Postsurgical changes status post bilateral laminectomies of the lower lumbar spine. Atherosclerotic vascular calcification.  IMPRESSION: Grade 2 anterolisthesis of L4 on L5.  25% height loss at L1.  These findings are unchanged from the comparison study.  Multilevel degenerative change.  Original Report Authenticated By: Waneta Martins, M.D.   Dg Hip Complete Right  07/30/2011  *RADIOLOGY REPORT*  Clinical Data: Low back pain radiating to the right hip status post recent fall.  RIGHT HIP - COMPLETE 2+ VIEW  Comparison: 03/21/2010  Findings: No displaced acute fracture or dislocation identified. No aggressive appearing osseous lesion.  Sacrum is partially obscured by overlying bowel.  IMPRESSION: No acute osseous abnormality identified.  If clinical concern for a nondisplaced right hip fracture persists, recommend MRI.  Original Report Authenticated By: Waneta Martins, M.D.    Assessment/Plan Principal Problem:  *Diabetes mellitus - will initiate pt's home regimen with  lantus and will monitor CBG to determine the correct insulin dose. Active Problems:  Depression - continue per primary team  HTN (hypertension) -at goal and well controlled   Time Spent on Admission: Over 30 minutes  Zakyla Tonche 07/31/2011, 12:43 PM

## 2011-07-31 NOTE — Progress Notes (Signed)
CBG:   Treatment: 15 GM carbohydrate snack  Symptoms: None  Follow-up CBG: Time:0625 CBG Result:127  Possible Reasons for Event: Unknown  Comments/MD notified:passed onto next shift    [name], Zachery Dauer

## 2011-07-31 NOTE — Progress Notes (Signed)
Patient said she was "feeling better" today. She asked for prayer.  I asked her if she'd like to pray with me and she said she didn't know how.  I let her just share her thoughts with God and then finished up the prayer.  Will follow up.

## 2011-07-31 NOTE — Progress Notes (Signed)
CBG: 51  Treatment: 15 GM carbohydrate snack  Symptoms: None  Follow-up CBG: Time:0555 CBG Result:52  Possible Reasons for Event: Unknown  Comments/MD notified:followed protocol    Keryn Nessler, Zachery Dauer

## 2011-08-01 LAB — GLUCOSE, CAPILLARY
Glucose-Capillary: 24 mg/dL — CL (ref 70–99)
Glucose-Capillary: >600 mg/dL (ref 70–99)
Glucose-Capillary: 94 mg/dL (ref 70–99)
Glucose-Capillary: 130 mg/dL — ABNORMAL HIGH (ref 70–99)
Glucose-Capillary: 352 mg/dL — ABNORMAL HIGH (ref 70–99)
Glucose-Capillary: 66 mg/dL — ABNORMAL LOW (ref 70–99)
Glucose-Capillary: 104 mg/dL — ABNORMAL HIGH (ref 70–99)
Glucose-Capillary: 254 mg/dL — ABNORMAL HIGH (ref 70–99)

## 2011-08-01 LAB — CBC
HCT: 33.8 % — ABNORMAL LOW (ref 36.0–46.0)
Hemoglobin: 11.4 g/dL — ABNORMAL LOW (ref 12.0–15.0)
MCH: 29 pg (ref 26.0–34.0)
MCHC: 33.7 g/dL (ref 30.0–36.0)
MCV: 86 fL (ref 78.0–100.0)
Platelets: 234 K/uL (ref 150–400)
RBC: 3.93 MIL/uL (ref 3.87–5.11)
RDW: 12.6 % (ref 11.5–15.5)
WBC: 7.1 K/uL (ref 4.0–10.5)

## 2011-08-01 LAB — BASIC METABOLIC PANEL WITH GFR
BUN: 14 mg/dL (ref 6–23)
CO2: 26 meq/L (ref 19–32)
Calcium: 8.8 mg/dL (ref 8.4–10.5)
Chloride: 99 meq/L (ref 96–112)
Creatinine, Ser: 0.56 mg/dL (ref 0.50–1.10)
GFR calc Af Amer: >90 mL/min
GFR calc non Af Amer: >90 mL/min
Glucose, Bld: 138 mg/dL — ABNORMAL HIGH (ref 70–99)
Potassium: 3.8 meq/L (ref 3.5–5.1)
Sodium: 133 meq/L — ABNORMAL LOW (ref 135–145)

## 2011-08-01 MED ORDER — POTASSIUM CHLORIDE CRYS ER 20 MEQ PO TBCR
20.0000 meq | EXTENDED_RELEASE_TABLET | Freq: Once | ORAL | Status: AC
  Administered 2011-08-01: 20 meq via ORAL
  Filled 2011-08-01: qty 1

## 2011-08-01 NOTE — Progress Notes (Signed)
Subjective: No major complains , still with episodes of hypoglycemia  Objective: Vital signs in last 24 hours: Filed Vitals:   07/31/11 1351 07/31/11 2055 08/01/11 0635 08/01/11 1305  BP: 115/72 139/78 109/66 123/76  Pulse: 108 78 71 81  Temp: 99 F (37.2 C) 98.1 F (36.7 C) 98.8 F (37.1 C) 99.1 F (37.3 C)  TempSrc: Oral Oral Oral Oral  Resp: 15 17 16 18   Height:      Weight:      SpO2: 91% 95% 90% 93%   Weight change:   Intake/Output Summary (Last 24 hours) at 08/01/11 1621 Last data filed at 08/01/11 1515  Gross per 24 hour  Intake 3284.35 ml  Output   4975 ml  Net -1690.65 ml    BP 123/76  Pulse 81  Temp(Src) 99.1 F (37.3 C) (Oral)  Resp 18  Ht 5\' 2"  (1.575 m)  Wt 74.844 kg (165 lb)  BMI 30.18 kg/m2  SpO2 93%  General Appearance:    Alert, cooperative, no distress, appears stated age  Head:    Normocephalic, without obvious abnormality, atraumatic  Eyes:    PERRL, conjunctiva/corneas clear, EOM's intact, fundi    benign, both eyes  Ears:    Normal TM's and external ear canals, both ears  Nose:   Nares normal, septum midline, mucosa normal, no drainage    or sinus tenderness  Throat:   Lips, mucosa, and tongue normal; teeth and gums normal  Neck:   Supple, symmetrical, trachea midline, no adenopathy;    thyroid:  no enlargement/tenderness/nodules; no carotid   bruit or JVD  Back:     Symmetric, no curvature, ROM normal, no CVA tenderness  Lungs:     Clear to auscultation bilaterally, respirations unlabored  Chest Wall:    No tenderness or deformity   Heart:    Regular rate and rhythm, S1 and S2 normal, no murmur, rub   or gallop  Breast Exam:    No tenderness, masses, or nipple abnormality  Abdomen:     Soft, non-tender, bowel sounds active all four quadrants,    no masses, no organomegaly  Genitalia:    Normal female without lesion, discharge or tenderness  Rectal:    Normal tone, normal prostate, no masses or tenderness;   guaiac negative stool    Extremities:   Extremities normal, atraumatic, no cyanosis or edema  Pulses:   2+ and symmetric all extremities  Skin:   Skin color, texture, turgor normal, no rashes or lesions  Lymph nodes:   Cervical, supraclavicular, and axillary nodes normal  Neurologic:   CNII-XII intact, normal strength, sensation and reflexes    throughout    Lab Results:  Basename 08/01/11 0349 07/31/11 1245  NA 133* 132*  K 3.8 4.5  CL 99 95*  CO2 26 23  GLUCOSE 138* 669*  BUN 14 17  CREATININE 0.56 0.81  CALCIUM 8.8 9.0  MG -- --  PHOS -- --   No results found for this basename: AST:2,ALT:2,ALKPHOS:2,BILITOT:2,PROT:2,ALBUMIN:2 in the last 72 hours No results found for this basename: LIPASE:2,AMYLASE:2 in the last 72 hours  Basename 08/01/11 0349 07/31/11 0355  WBC 7.1 7.8  NEUTROABS -- --  HGB 11.4* 12.4  HCT 33.8* 36.0  MCV 86.0 86.1  PLT 234 242   No results found for this basename: CKTOTAL:3,CKMB:3,CKMBINDEX:3,TROPONINI:3 in the last 72 hours No results found for this basename: POCBNP:3 in the last 72 hours No results found for this basename: DDIMER:2 in the last 72 hours  No results found for this basename: HGBA1C:2 in the last 72 hours No results found for this basename: CHOL:2,HDL:2,LDLCALC:2,TRIG:2,CHOLHDL:2,LDLDIRECT:2 in the last 72 hours  Basename 07/30/11 1215  TSH 2.266  T4TOTAL --  T3FREE --  THYROIDAB --   No results found for this basename: VITAMINB12:2,FOLATE:2,FERRITIN:2,TIBC:2,IRON:2,RETICCTPCT:2 in the last 72 hours  Micro Results: No results found for this or any previous visit (from the past 240 hour(s)).  Studies/Results: Dg Chest 2 View  07/21/2011  *RADIOLOGY REPORT*  Clinical Data: Shortness of breath, chest pain  CHEST - 2 VIEW  Comparison: 03/21/2010  Findings: Cervical fixation hardware and left humeral head prosthesis partially seen.  Stable compression deformity in the visualized upper lumbar spine.  Lungs clear.  Heart size normal. No effusion.   IMPRESSION:  1.  No acute disease.  Original Report Authenticated By: Osa Craver, M.D.   Dg Lumbar Spine Complete  07/30/2011  *RADIOLOGY REPORT*  Clinical Data: Recent fall, back pain.  LUMBAR SPINE - COMPLETE 4+ VIEW  Comparison: 03/20/2010  Findings:  There is grade 2 anterolisthesis of L4-L5.  This is similar to prior.  There is a proximally 25% height loss of the L1 vertebral body anteriorly.  This is unchanged from the prior.  No definite acute fracture or dislocation identified.  Multilevel degenerative changes and facet arthropathy.  Postsurgical changes status post bilateral laminectomies of the lower lumbar spine. Atherosclerotic vascular calcification.  IMPRESSION: Grade 2 anterolisthesis of L4 on L5.  25% height loss at L1.  These findings are unchanged from the comparison study.  Multilevel degenerative change.  Original Report Authenticated By: Waneta Martins, M.D.   Dg Hip Complete Right  07/30/2011  *RADIOLOGY REPORT*  Clinical Data: Low back pain radiating to the right hip status post recent fall.  RIGHT HIP - COMPLETE 2+ VIEW  Comparison: 03/21/2010  Findings: No displaced acute fracture or dislocation identified. No aggressive appearing osseous lesion.  Sacrum is partially obscured by overlying bowel.  IMPRESSION: No acute osseous abnormality identified.  If clinical concern for a nondisplaced right hip fracture persists, recommend MRI.  Original Report Authenticated By: Waneta Martins, M.D.    Medications: I have reviewed the patient's current medications. Scheduled Meds:   . amLODipine  10 mg Oral Daily  . citalopram  20 mg Oral QHS  . DULoxetine  60 mg Oral Daily  . enalapril  20 mg Oral Daily  . enoxaparin  40 mg Subcutaneous Q24H  . insulin aspart  0-9 Units Subcutaneous Q4H  . insulin aspart  5 Units Subcutaneous TID WC  . insulin glargine  10 Units Subcutaneous Once  . insulin glargine  30 Units Subcutaneous QHS  . metoCLOPramide  10 mg Oral TID AC    . traZODone  100 mg Oral QHS  . DISCONTD: insulin glargine  20 Units Subcutaneous QHS   Continuous Infusions:   . sodium chloride 75 mL/hr at 08/01/11 1333   PRN Meds:.acetaminophen, acetaminophen, ALPRAZolam, HYDROcodone-acetaminophen, ondansetron (ZOFRAN) IV, ondansetron, senna-docusate  Assessment/Plan: Principal Problem: *Diabetes mellitus - difficult to establish control, given multiple episodes of hypoglycemia yesterday and only one CBG 350 and other readings <50 we will continue SSI for now and current basal insulin once we get a better idea of sugar control. Encourage PO intake.  Active Problems:  Depression - continue medications per psych recommendations  HTN (hypertension) - at goal for now    LOS: 2 days  Ronalee Scheunemann I. 08/01/2011, 4:21 PM

## 2011-08-01 NOTE — Progress Notes (Signed)
CBG at 00:37 on 08/01/11 was 69. Hypoglycemia protocol initiated. Graham crackers given to pt. CBG at 00:52 was 56. Orange juice given to pt. CBG at 01:07 was 96. Hypoglycemia resolved. Pt not exhibiting any symptoms during entire episode.   Earnest Conroy, RN 08/01/11 03:03

## 2011-08-02 ENCOUNTER — Encounter (HOSPITAL_COMMUNITY): Payer: Self-pay | Admitting: *Deleted

## 2011-08-02 LAB — GLUCOSE, CAPILLARY
Glucose-Capillary: 25 mg/dL — CL (ref 70–99)
Glucose-Capillary: 114 mg/dL — ABNORMAL HIGH (ref 70–99)
Glucose-Capillary: 365 mg/dL — ABNORMAL HIGH (ref 70–99)

## 2011-08-02 LAB — BASIC METABOLIC PANEL
CO2: 30 mEq/L (ref 19–32)
Chloride: 102 mEq/L (ref 96–112)
GFR calc Af Amer: >90 mL/min (ref >90)
Potassium: 3.5 mEq/L (ref 3.5–5.1)

## 2011-08-02 LAB — CBC
Platelets: 217 10*3/uL (ref 150–400)
RBC: 4.17 MIL/uL (ref 3.87–5.11)
RDW: 12.8 % (ref 11.5–15.5)
WBC: 6.1 10*3/uL (ref 4.0–10.5)

## 2011-08-02 MED ORDER — INSULIN GLARGINE 100 UNIT/ML ~~LOC~~ SOLN
35.0000 [IU] | Freq: Every day | SUBCUTANEOUS | Status: DC
  Administered 2011-08-02 – 2011-08-03 (×2): 35 [IU] via SUBCUTANEOUS
  Filled 2011-08-02: qty 3

## 2011-08-02 MED ORDER — DEXTROSE 50 % IV SOLN
INTRAVENOUS | Status: AC
  Administered 2011-08-02: 25 mL
  Filled 2011-08-02: qty 50

## 2011-08-02 MED ORDER — NICOTINE 21 MG/24HR TD PT24
21.0000 mg | MEDICATED_PATCH | Freq: Every day | TRANSDERMAL | Status: DC
  Administered 2011-08-02 – 2011-08-09 (×8): 21 mg via TRANSDERMAL
  Filled 2011-08-02 (×9): qty 1

## 2011-08-02 NOTE — Progress Notes (Signed)
Awaiting psych consult for possible return to [facility].  CSW will follow for d/c planning.
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
## 2011-08-02 NOTE — Progress Notes (Signed)
Occupational Therapy Treatment Patient Details Name: [name] MRN: [id number] DOB: 08-Sep-1961 Today's Date: 08/02/2011 Time in: 9:10am Time out: 9:25 am Pager: [phone]  OT Assessment/Plan OT Assessment/Plan Comments on Treatment Session: Patient tolerated session well. OT Frequency: Min 2X/week Follow Up Recommendations: None OT Goals    OT Treatment Precautions/Restrictions      ADL ADL Upper Body Bathing: Performed;Chest;Right arm;Left arm;Abdomen;Minimal assistance Upper Body Bathing Details (indicate cue type and reason): min guard assist standing Where Assessed - Upper Body Bathing: Standing at sink Lower Body Bathing: Performed;Minimal assistance Lower Body Bathing Details (indicate cue type and reason): min guard assist standing. Able to wash all parts without assist. Where Assessed - Lower Body Bathing: Other (comment);Sit to stand from chair (sit to stand from toilet) Upper Body Dressing: Simulated;Minimal assistance Upper Body Dressing Details (indicate cue type and reason): min guard assist standing Where Assessed - Upper Body Dressing: Standing Lower Body Dressing: Performed;Minimal assistance Lower Body Dressing Details (indicate cue type and reason): min guard assist with mesh underwear to stand and pull up.. setup with socks. Where Assessed - Lower Body Dressing: Sit to stand from chair (sit to stand from toilet) Toilet Transfer: Minimal assistance Toilet Transfer Details (indicate cue type and reason): min guard assist no device. Patient refused RW use. Toilet Transfer Method: Proofreader: Comfort height toilet Mobility    Exercises    End of Session OT - End of Session Activity Tolerance: Patient tolerated treatment well Patient left: in bed (with sitter) General Behavior During Session: [facility] for tasks performed Cognition:  (patient refused use of RW even with history of falls.)  Lennox Laity  08/02/2011,  9:49 AM

## 2011-08-02 NOTE — Progress Notes (Signed)
CBG: 30  Treatment: D50 IV 25 mL  Symptoms: Shaky, Hungry and Nervous/irritable  Follow-up CBG: Time :0642 CBG Result 114  Possible Reasons for Event: Medication regimen: Pt's insulin is being adjusted daily.  Comments/MD notified: Dr. Tomasa Blase, Clinton Quant

## 2011-08-02 NOTE — Progress Notes (Signed)
Subjective: No major compalins. Has some mild back pain  Objective: Vital signs in last 24 hours: Filed Vitals:   08/01/11 1305 08/01/11 2043 08/02/11 0632 08/02/11 1346  BP: 123/76 147/88 117/78 130/74  Pulse: 81 68 79 81  Temp: 99.1 F (37.3 C) 98 F (36.7 C) 97.8 F (36.6 C) 97.9 F (36.6 C)  TempSrc: Oral Oral Oral Oral  Resp: 18 18 20 18   Height:      Weight:      SpO2: 93% 97% 95% 94%   Weight change:   Intake/Output Summary (Last 24 hours) at 08/02/11 2045 Last data filed at 08/02/11 1933  Gross per 24 hour  Intake    320 ml  Output   2050 ml  Net  -1730 ml    Physical Exam:  General: Alert, awake, oriented x3, in no acute distress.  HEENT: No bruits, no goiter.  Heart: Regular rate and rhythm, without murmurs, rubs, gallops.  Lungs: Clear to auscultation bilaterally.  Abdomen: Soft, nontender, nondistended, positive bowel sounds.  Extremities: No clubbing cyanosis or edema with positive pedal pulses.  Neuro: Grossly intact, nonfocal.    lab Results:  [facility] 08/02/11 0415 08/01/11 0349  NA 139 133*  K 3.5 3.8  CL 102 99  CO2 30 26  GLUCOSE 75 138*  BUN 9 14  CREATININE 0.55 0.56  CALCIUM 9.4 8.8  MG -- --  PHOS -- --    Studies/Results: Dg Chest 2 View  Dg Lumbar Spine Complete  07/30/2011  *RADIOLOGY REPORT*  Clinical Data: Recent fall, back pain.  LUMBAR SPINE - COMPLETE 4+ VIEW  Comparison: 03/20/2010  Findings:  There is grade 2 anterolisthesis of L4-L5.  This is similar to prior.  There is a proximally 25% height loss of the L1 vertebral body anteriorly.  This is unchanged from the prior.  No definite acute fracture or dislocation identified.  Multilevel degenerative changes and facet arthropathy.  Postsurgical changes status post bilateral laminectomies of the lower lumbar spine. Atherosclerotic vascular calcification.  IMPRESSION: Grade 2 anterolisthesis of L4 on L5.  25% height loss at L1.  These findings are unchanged from the comparison  study.  Multilevel degenerative change.  Original Report Authenticated By: Waneta Martins, M.D.   Dg Hip Complete Right  07/30/2011  *RADIOLOGY REPORT*  Clinical Data: Low back pain radiating to the right hip status post recent fall.  RIGHT HIP - COMPLETE 2+ VIEW  Comparison: 03/21/2010  Findings: No displaced acute fracture or dislocation identified. No aggressive appearing osseous lesion.  Sacrum is partially obscured by overlying bowel.  IMPRESSION: No acute osseous abnormality identified.  If clinical concern for a nondisplaced right hip fracture persists, recommend MRI.  Original Report Authenticated By: Waneta Martins, M.D.    Medications: I have reviewed the patient's current medications. Scheduled Meds:   . amLODipine  10 mg Oral Daily  . citalopram  20 mg Oral QHS  . dextrose      . DULoxetine  60 mg Oral Daily  . enalapril  20 mg Oral Daily  . enoxaparin  40 mg Subcutaneous Q24H  . insulin aspart  0-9 Units Subcutaneous Q4H  . insulin aspart  5 Units Subcutaneous TID WC  . insulin glargine  30 Units Subcutaneous QHS  . metoCLOPramide  10 mg Oral TID AC  . traZODone  100 mg Oral QHS   Continuous Infusions:  PRN Meds:.acetaminophen, acetaminophen, ALPRAZolam, HYDROcodone-acetaminophen, ondansetron (ZOFRAN) IV, ondansetron, senna-docusate  Assessment/Plan: Principal Problem: Diabetes mellitus - difficult  to establish control, given multiple episodes of hypoglycemia yesterday and only one CBG 350, still has another hypoglycemic will increase lantus to 35 unit [place] QHS,  Still with hypoglycemia. Back pain acute on chronic. Xray negative , please consider MRI IF still in pain currently pain better controllActive Problems:  Depression - continue medications per psych recommendations . Psych consult for patient disposition as patient transfer from [facility] HTN (hypertension) - at goal for now  Active Problems:  Depression  HTN (hypertension)     LOS: 3 days  Landen Knoedler  I. 08/02/2011, 8:45 PM

## 2011-08-02 NOTE — Progress Notes (Signed)
PER PT/OT RECOMMENDATIONS-NO PT/OT NEEDS,JUST  ROLLING WALKER NEEDED. CURRENTLY RW  IN PATIENT'S RM WILL BE USED @ [facility].IF D/C HOME THEN WE WILL ORDER RW FROM [facility] IF NEEDED.SW NOTIFIED OF RW TO GO W/PATIENT TO [facility].PT/OT AWARE OF PATIENT'S NAME SO THEY CAN FOLLOW FOR P/U OF RW FROM [facility].

## 2011-08-02 NOTE — Progress Notes (Signed)
Inpatient Diabetes Program Recommendations  AACE/ADA: New Consensus Statement on Inpatient Glycemic Control (2009)  Target Ranges:  Prepandial:   less than 140 mg/dL      Peak postprandial:   less than 180 mg/dL (1-2 hours)      Critically ill patients:  140 - 180 mg/dL   Reason for Visit: Uncontrolled blood sugars (hyper and hypo)  Inpatient Diabetes Program Recommendations Insulin - Basal: Decrease Lantus to 25 units QHS.  Note: CBGs up and down.  Has had hypoglycemia last 3 days in am.  Adjust insulin for better glycemic control.

## 2011-08-02 NOTE — Progress Notes (Signed)
Physical Therapy Treatment Patient Details Name: [name] MRN: [id number] DOB: [birth year] Today's Date: 08/02/2011 Time: 1500-1509  1G PT Assessment/Plan  PT - Assessment/Plan Comments on Treatment Session: Plan is for pt to return to [facility] with use of RW. Pt agreeable to use of RW during this session. PT Plan: Discharge plan remains appropriate Equipment Recommended: Rolling walker with 5" wheels PT Goals  Acute Rehab PT Goals PT Goal: Supine/Side to Sit - Progress: Met PT Transfer Goal: Sit to Stand/Stand to Sit - Progress: Progressing toward goal PT Goal: Ambulate - Progress: Progressing toward goal  PT Treatment Precautions/Restrictions  Restrictions Weight Bearing Restrictions: No Mobility (including Balance) Bed Mobility Bed Mobility: Yes Supine to Sit: 7: Independent Sit to Supine - Left: 7: Independent Transfers Transfers: Yes Sit to Stand: 5: Supervision Stand to Sit: 5: Supervision Ambulation/Gait Ambulation/Gait: Yes Ambulation/Gait Assistance: 5: Supervision Ambulation/Gait Assistance Details (indicate cue type and reason): VCs safety.  Ambulation Distance (Feet): 140 Feet Assistive device: Rolling walker Gait Pattern:  (pt walks on toes)    Exercise    End of Session PT - End of Session Equipment Utilized During Treatment: Gait belt Activity Tolerance: Patient tolerated treatment well Patient left: in bed;Other (comment) (with Recruitment consultant) General Behavior During Session: [facility] for tasks performed Cognition: [facility] for tasks performed  [name] [facility] 08/02/2011, 3:47 PM

## 2011-08-03 DIAGNOSIS — F17200 Nicotine dependence, unspecified, uncomplicated: Secondary | ICD-10-CM

## 2011-08-03 LAB — CBC
HCT: 39.3 % (ref 36.0–46.0)
Hemoglobin: 13.4 g/dL (ref 12.0–15.0)
MCH: 29.7 pg (ref 26.0–34.0)
MCHC: 34.1 g/dL (ref 30.0–36.0)
MCV: 87.1 fL (ref 78.0–100.0)

## 2011-08-03 LAB — GLUCOSE, CAPILLARY
Glucose-Capillary: 37 mg/dL — CL (ref 70–99)
Glucose-Capillary: 66 mg/dL — ABNORMAL LOW (ref 70–99)
Glucose-Capillary: 273 mg/dL — ABNORMAL HIGH (ref 70–99)

## 2011-08-03 LAB — BASIC METABOLIC PANEL
BUN: 14 mg/dL (ref 6–23)
GFR calc non Af Amer: >90 mL/min (ref >90)
Glucose, Bld: 43 mg/dL — CL (ref 70–99)
Potassium: 3.6 mEq/L (ref 3.5–5.1)

## 2011-08-03 MED ORDER — GLUCOSE-VITAMIN C 4-6 GM-MG PO CHEW
CHEWABLE_TABLET | ORAL | Status: AC
  Administered 2011-08-03: 04:00:00
  Filled 2011-08-03: qty 1

## 2011-08-03 NOTE — Progress Notes (Signed)
Per RN, [name], Pt not ready for D/c today.  CSW will f/u tomorrow.  [place], [place]  [phone]  Weekend Coverage

## 2011-08-03 NOTE — Progress Notes (Signed)
Dr. Burnadette Peter notified of serum glucose 43 @ 0553. Gwen Neeva Trew,RN 08-03-11.

## 2011-08-03 NOTE — Progress Notes (Signed)
CBG: 37   Treatment: 3 glucose tabs  Symptoms: Shaky, Hungry and Nervous/irritable  Follow-up CBG: Time:0430 CBG Result:66  Possible Reasons for Event: Medication regimen:   Comments/MD notified:Dr. Louellen Molder

## 2011-08-03 NOTE — Progress Notes (Signed)
[name] is a 50 y.o. female patient.  SUBJECTIVE Psych appreciated. reviewed chart. Saw and examined patient at bedside. Patient with psych disorder, transferred from [facility] for diabetic management. Her sugars seem to be stabilizing. [name] worried about returning home too soon, because of her psychiatric condition.   1. Tobacco abuse     Past Medical History  Diagnosis Date  . Diabetes mellitus   . Hypertension   . Anemia   . Chronic back pain   . Depression   . Neuropathy   . Hypercholesterolemia    Current Facility-Administered Medications  Medication Dose Route Frequency Provider Last Rate Last Dose  . acetaminophen (TYLENOL) tablet 650 mg  650 mg Oral Q6H PRN Manson Passey, MD       Or  . acetaminophen (TYLENOL) suppository 650 mg  650 mg Rectal Q6H PRN Manson Passey, MD      . ALPRAZolam Prudy Feeler) tablet 0.5 mg  0.5 mg Oral BID PRN Manson Passey, MD      . amLODipine (NORVASC) tablet 10 mg  10 mg Oral Daily Manson Passey, MD   10 mg at 08/03/11 1121  . citalopram (CELEXA) tablet 20 mg  20 mg Oral QHS Manson Passey, MD   20 mg at 08/02/11 2202  . DULoxetine (CYMBALTA) DR capsule 60 mg  60 mg Oral Daily Manson Passey, MD   60 mg at 08/03/11 1119  . enalapril (VASOTEC) tablet 20 mg  20 mg Oral Daily Manson Passey, MD   20 mg at 08/03/11 1121  . enoxaparin (LOVENOX) injection 40 mg  40 mg Subcutaneous Q24H Manson Passey, MD   40 mg at 08/03/11 1551  . glucose-Vitamin C 4-0.006 GM per chewable tablet           . HYDROcodone-acetaminophen (NORCO) 5-325 MG per tablet 1-2 tablet  1-2 tablet Oral Q6H PRN Manson Passey, MD   2 tablet at 08/03/11 1119  . insulin aspart (novoLOG) injection 0-9 Units  0-9 Units Subcutaneous Q4H Manson Passey, MD   0 Units at 08/03/11 1655  . insulin glargine (LANTUS) injection 35 Units  35 Units Subcutaneous QHS Hind I. Elsaid   35 Units at 08/02/11 2200  . metoCLOPramide (REGLAN) tablet 10 mg  10 mg Oral TID [facility] Manson Passey, MD   10 mg at 08/03/11 1656  . nicotine  (NICODERM CQ - dosed in mg/24 hours) patch 21 mg  21 mg Transdermal Daily Lorenza A. Lynch   21 mg at 08/03/11 1120  . ondansetron (ZOFRAN) tablet 4 mg  4 mg Oral Q6H PRN Manson Passey, MD       Or  . ondansetron [facility]) injection 4 mg  4 mg Intravenous Q6H PRN Manson Passey, MD   4 mg at 07/31/11 1535  . senna-docusate (Senokot-S) tablet 1 tablet  1 tablet Oral Daily PRN Manson Passey, MD      . traZODone (DESYREL) tablet 100 mg  100 mg Oral QHS Manson Passey, MD   100 mg at 08/02/11 2202  . DISCONTD: insulin aspart (novoLOG) injection 5 Units  5 Units Subcutaneous TID [facility] Manson Passey, MD   5 Units at 08/02/11 [phone]  . DISCONTD: insulin glargine (LANTUS) injection 30 Units  30 Units Subcutaneous QHS Manson Passey, MD   30 Units at 08/01/11 2257   Allergies  Allergen Reactions  . Celecoxib Nausea Only  . Ibuprofen Nausea Only   Principal Problem:  *Diabetes mellitus Active Problems:  Depression  HTN (hypertension)   Vital signs in last  24 hours: Temp:  [98 F (36.7 C)-98.9 F (37.2 C)] 98.9 F (37.2 C) (11/10 1415) Pulse Rate:  [78-88] 79  (11/10 1415) Resp:  [18-20] 18  (11/10 1415) BP: (119-149)/(63-84) 127/83 mmHg (11/10 1415) SpO2:  [96 %-98 %] 96 % (11/10 1415) Weight change:  Last BM Date: 07/31/11  Intake/Output from previous day: 11/09 0701 - 11/10 0700 In: 760 [P.O.:760] Out: 1850 [Urine:1850] Intake/Output this shift: Total I/O In: 480 [P.O.:480] Out: 1350 [Urine:1350]  Lab Results:  [facility] 08/03/11 0418 08/02/11 0415  WBC 7.3 6.1  HGB 13.4 12.4  HCT 39.3 35.7*  PLT 245 217   BMET  Basename 08/03/11 0418 08/02/11 0415  NA 138 139  K 3.6 3.5  CL 100 102  CO2 30 30  GLUCOSE 43* 75  BUN 14 9  CREATININE 0.64 0.55  CALCIUM 10.1 9.4    Studies/Results: No results found.  Medications: I have reviewed the patient's current medications.   Physical exam GENERAL- alert, robust, well, happy and active HEAD- normal atraumatic, no neck masses, normal thyroid,  no jvd RESPIRATORY- chest clear, no wheezing, crepitations, rhonchi, normal symmetric air entry CVS- regular rate and rhythm, S1, S2 normal, no murmur, click, rub or gallop ABDOMEN- abdomen is soft without significant tenderness, masses, organomegaly or guarding NEURO- Alert and oriented X 3, normal strength and tone. Normal symmetric reflexes. Normal coordination and gait EXTREMITIES- extremities normal, atraumatic, no cyanosis or edema  Plan  1. Diabetes mellitus- better controlled. Continue current meds.  2.  Depression- defer management to Dr Rogers Blocker. Patient medically stable for transfer to [facility] when bed becomes available. Will plan for d/c in am if bed available. 3.  HTN (hypertension)-stable. 4. DVT/GI prophylaxis.    Jeptha Hinnenkamp 08/03/2011 5:49 PM Pager: [phone].
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
## 2011-08-04 ENCOUNTER — Telehealth (HOSPITAL_COMMUNITY): Payer: Self-pay | Admitting: Licensed Clinical Social Worker

## 2011-08-04 ENCOUNTER — Inpatient Hospital Stay (HOSPITAL_COMMUNITY): Admission: RE | Admit: 2011-08-04 | Payer: Medicaid Other | Source: Ambulatory Visit | Admitting: Psychiatry

## 2011-08-04 LAB — BASIC METABOLIC PANEL
BUN: 13 mg/dL (ref 6–23)
CO2: 30 mEq/L (ref 19–32)
Chloride: 102 mEq/L (ref 96–112)
Glucose, Bld: 41 mg/dL — CL (ref 70–99)
Potassium: 3.5 mEq/L (ref 3.5–5.1)

## 2011-08-04 LAB — GLUCOSE, CAPILLARY
Glucose-Capillary: 322 mg/dL — ABNORMAL HIGH (ref 70–99)
Glucose-Capillary: 103 mg/dL — ABNORMAL HIGH (ref 70–99)
Glucose-Capillary: 157 mg/dL — ABNORMAL HIGH (ref 70–99)
Glucose-Capillary: 68 mg/dL — ABNORMAL LOW (ref 70–99)
Glucose-Capillary: 257 mg/dL — ABNORMAL HIGH (ref 70–99)
Glucose-Capillary: 296 mg/dL — ABNORMAL HIGH (ref 70–99)

## 2011-08-04 LAB — CBC
HCT: 38.5 % (ref 36.0–46.0)
Hemoglobin: 12.8 g/dL (ref 12.0–15.0)
MCV: 88.1 fL (ref 78.0–100.0)
WBC: 6.4 10*3/uL (ref 4.0–10.5)

## 2011-08-04 MED ORDER — AMLODIPINE BESYLATE 10 MG PO TABS: 10.0000 mg | ORAL_TABLET | Freq: Every day | ORAL | Status: DC

## 2011-08-04 MED ORDER — HYDROCODONE-ACETAMINOPHEN 5-500 MG PO TABS: 1.0000 | ORAL_TABLET | Freq: Every day | ORAL | Status: DC | PRN

## 2011-08-04 MED ORDER — INSULIN GLARGINE 100 UNIT/ML ~~LOC~~ SOLN: 28.0000 [IU] | Freq: Every day | SUBCUTANEOUS | Status: DC

## 2011-08-04 MED ORDER — INSULIN GLARGINE 100 UNIT/ML ~~LOC~~ SOLN
28.0000 [IU] | Freq: Every day | SUBCUTANEOUS | Status: DC
  Administered 2011-08-04: 28 [IU] via SUBCUTANEOUS
  Filled 2011-08-04: qty 3

## 2011-08-04 MED ORDER — ALPRAZOLAM 0.5 MG PO TABS: 0.5000 mg | ORAL_TABLET | Freq: Two times a day (BID) | ORAL | Status: DC | PRN

## 2011-08-04 NOTE — BH Assessment (Incomplete)
Assessment Note   [name] is an 50 y.o. female.   {Axis Diagnosis:3049000}  Past Medical History:  Past Medical History  Diagnosis Date  . Diabetes mellitus   . Hypertension   . Anemia   . Chronic back pain   . Depression   . Neuropathy   . Hypercholesterolemia     No past surgical history on file.  Family History: No family history on file.  Social History:  reports that she has been smoking Cigarettes.  She has a 10 pack-year smoking history. She does not have any smokeless tobacco history on file. She reports that she does not drink alcohol or use illicit drugs.  Allergies:  Allergies  Allergen Reactions  . Celecoxib Nausea Only  . Ibuprofen Nausea Only    Home Medications:  Medications Prior to Admission  Medication Dose Route Frequency Provider Last Rate Last Dose  . acetaminophen (TYLENOL) tablet 650 mg  650 mg Oral Q6H PRN Manson Passey, MD       Or  . acetaminophen (TYLENOL) suppository 650 mg  650 mg Rectal Q6H PRN Manson Passey, MD      . ALPRAZolam Prudy Feeler) tablet 0.5 mg  0.5 mg Oral BID PRN Manson Passey, MD      . amLODipine (NORVASC) tablet 10 mg  10 mg Oral Daily Manson Passey, MD   10 mg at 08/04/11 1045  . citalopram (CELEXA) tablet 20 mg  20 mg Oral QHS Manson Passey, MD   20 mg at 08/03/11 2114  . DULoxetine (CYMBALTA) DR capsule 60 mg  60 mg Oral Daily Manson Passey, MD   60 mg at 08/04/11 1045  . enalapril (VASOTEC) tablet 20 mg  20 mg Oral Daily Manson Passey, MD   20 mg at 08/04/11 1045  . enoxaparin (LOVENOX) injection 40 mg  40 mg Subcutaneous Q24H Manson Passey, MD   40 mg at 08/04/11 1457  . glucose-Vitamin C 4-0.006 GM per chewable tablet           . HYDROcodone-acetaminophen (NORCO) 5-325 MG per tablet 1-2 tablet  1-2 tablet Oral Q6H PRN Manson Passey, MD   2 tablet at 08/04/11 1157  . insulin aspart (novoLOG) injection 0-9 Units  0-9 Units Subcutaneous Q4H Manson Passey, MD   5 Units at 08/04/11 1159  . insulin glargine (LANTUS) injection 35 Units  35  Units Subcutaneous QHS Hind I. Elsaid   35 Units at 08/03/11 2116  . metoCLOPramide (REGLAN) tablet 10 mg  10 mg Oral TID [facility] Manson Passey, MD   10 mg at 08/04/11 1201  . nicotine (NICODERM CQ - dosed in mg/24 hours) patch 21 mg  21 mg Transdermal Daily Ileta A. Lynch   21 mg at 08/04/11 1044  . ondansetron (ZOFRAN) tablet 4 mg  4 mg Oral Q6H PRN Manson Passey, MD   4 mg at 08/03/11 2156   Or  . ondansetron (ZOFRAN) injection 4 mg  4 mg Intravenous Q6H PRN Manson Passey, MD   4 mg at 07/31/11 1535  . senna-docusate (Senokot-S) tablet 1 tablet  1 tablet Oral Daily PRN Manson Passey, MD   1 tablet at 08/03/11 2114  . traZODone (DESYREL) tablet 100 mg  100 mg Oral QHS Manson Passey, MD   100 mg at 08/03/11 2114   Medications Prior to Admission  Medication Sig Dispense Refill  . ALPRAZolam (XANAX) 0.5 MG tablet Take 1 tablet (0.5 mg total) by mouth 2 (two) times daily as needed. For anxiety  20 tablet  0  . amLODipine (NORVASC) 10 MG tablet Take 1 tablet (10 mg total) by mouth daily.  30 tablet  0  . HYDROcodone-acetaminophen (VICODIN) 5-500 MG per tablet Take 1 tablet by mouth daily as needed. For pain  15 tablet  0  . insulin glargine (LANTUS) 100 UNIT/ML injection Inject 28 Units into the skin at bedtime.  10 mL  0    OB/GYN Status:  No LMP recorded.     Risk to self Depression: No (no current depression) Depression Symptoms:  (no current depression)                                       Disposition:  Disposition Disposition of Patient: Inpatient treatment program (Pt has been accepted by Dr. Elsie Saas to [facility] Rm [phone])  On Site Evaluation by:   Reviewed with Physician:     Melynda Ripple [facility] 08/04/2011 3:34 PM

## 2011-08-04 NOTE — Discharge Summary (Signed)
DISCHARGE SUMMARY  [name]  MR#: [id number]  DOB:23-Sep-1961  Date of Admission: 07/30/2011 Date of Discharge: 08/04/2011  Attending Physician:Ahsha Hinsley  Patient's PCP:No primary provider on file.  Consults: Dr Rogers Blocker  Discharge Diagnoses: Present on Admission:  .Diabetes mellitus .Depression .HTN (hypertension)    Current Discharge Medication List    CONTINUE these medications which have CHANGED   Details  ALPRAZolam (XANAX) 0.5 MG tablet Take 1 tablet (0.5 mg total) by mouth 2 (two) times daily as needed. For anxiety Qty: 20 tablet, Refills: 0    HYDROcodone-acetaminophen (VICODIN) 5-500 MG per tablet Take 1 tablet by mouth daily as needed. For pain Qty: 15 tablet, Refills: 0    insulin glargine (LANTUS) 100 UNIT/ML injection Inject 28 Units into the skin at bedtime. Qty: 10 mL, Refills: 0      CONTINUE these medications which have NOT CHANGED   Details  citalopram (CELEXA) 20 MG tablet Take 20 mg by mouth at bedtime.     DULoxetine (CYMBALTA) 60 MG capsule Take 60 mg by mouth daily.     enalapril (VASOTEC) 20 MG tablet Take 20 mg by mouth daily.     insulin lispro (HUMALOG) 100 UNIT/ML injection Inject 5 Units into the skin 3 (three) times daily before meals. As needed for every 300    metoCLOPramide (REGLAN) 10 MG tablet Take 10 mg by mouth 3 (three) times daily before meals.     traZODone (DESYREL) 100 MG tablet Take 100 mg by mouth at bedtime.           Hospital Course: Present on Admission:  [name] was admitted with uncontrolled DM 2 from [facility]. During the hospital stay she was noted to have episodes of hypoglycemia. She says she has taken 40 units of Lantus at home for years, in addition to humalog sliding scale with meals but this dosage seems to be too much for her. I reduced her Lantus dose to 28 units, and she should continue the humalog as she was doing. [name] better off with high normal sugars than low sugars, which could be  catastrophic. She may need gradual adjustment of the Lantus in the outpatient setting when she is back to her usual diet at home. I would not be aggressive after perfecting her sugars in the hospital setting as she has no evidence of DKA or nonketotic coma. I have discussed this with the patient and she expresses understanding. Psychiatric input is greatly appreciated. Patient is ready for discharge from medical standpoint. We will refer her for transfer to [facility] when bed becomes available. Day of Discharge BP 108/65  Pulse 73  Temp(Src) 98.4 F (36.9 C) (Oral)  Resp 16  Ht 5\' 2"  (1.575 m)  Wt 74.844 kg (165 lb)  BMI 30.18 kg/m2  SpO2 94%  Physical Exam: Normal.  Results for orders placed during the hospital encounter of 07/30/11 (from the past 24 hour(s))  GLUCOSE, CAPILLARY     Status: Abnormal   Collection Time   08/03/11 12:01 PM      Component Value Range   Glucose-Capillary 186 (*) 70 - 99 (mg/dL)   Comment 1 Notify RN    GLUCOSE, CAPILLARY     Status: Abnormal   Collection Time   08/03/11  5:01 PM      Component Value Range   Glucose-Capillary 114 (*) 70 - 99 (mg/dL)   Comment 1 Notify RN    GLUCOSE, CAPILLARY     Status: Abnormal   Collection Time  08/03/11  8:21 PM      Component Value Range   Glucose-Capillary 273 (*) 70 - 99 (mg/dL)   Comment 1 Notify RN     Comment 2 Documented in Chart    GLUCOSE, CAPILLARY     Status: Abnormal   Collection Time   08/04/11 12:07 AM      Component Value Range   Glucose-Capillary 322 (*) 70 - 99 (mg/dL)   Comment 1 Notify RN     Comment 2 Documented in Chart    GLUCOSE, CAPILLARY     Status: Abnormal   Collection Time   08/04/11  2:59 AM      Component Value Range   Glucose-Capillary 103 (*) 70 - 99 (mg/dL)  CBC     Status: Normal   Collection Time   08/04/11  4:35 AM      Component Value Range   WBC 6.4  4.0 - 10.5 (K/uL)   RBC 4.37  3.87 - 5.11 (MIL/uL)   Hemoglobin 12.8  12.0 - 15.0 (g/dL)   HCT 25.3  66.4 - 40.3 (%)    MCV 88.1  78.0 - 100.0 (fL)   MCH 29.3  26.0 - 34.0 (pg)   MCHC 33.2  30.0 - 36.0 (g/dL)   RDW 47.4  25.9 - 56.3 (%)   Platelets 212  150 - 400 (K/uL)  BASIC METABOLIC PANEL     Status: Abnormal   Collection Time   08/04/11  4:35 AM      Component Value Range   Sodium 139  135 - 145 (mEq/L)   Potassium 3.5  3.5 - 5.1 (mEq/L)   Chloride 102  96 - 112 (mEq/L)   CO2 30  19 - 32 (mEq/L)   Glucose, Bld 41 (*) 70 - 99 (mg/dL)   BUN 13  6 - 23 (mg/dL)   Creatinine, Ser 8.75  0.50 - 1.10 (mg/dL)   Calcium 9.1  8.4 - 64.3 (mg/dL)   GFR calc non Af Amer >90  >90 (mL/min)   GFR calc Af Amer >90  >90 (mL/min)  GLUCOSE, CAPILLARY     Status: Abnormal   Collection Time   08/04/11  5:40 AM      Component Value Range   Glucose-Capillary 68 (*) 70 - 99 (mg/dL)   Comment 1 Notify RN     Comment 2 Documented in Chart    GLUCOSE, CAPILLARY     Status: Abnormal   Collection Time   08/04/11  6:11 AM      Component Value Range   Glucose-Capillary 157 (*) 70 - 99 (mg/dL)   Comment 1 Notify RN     Comment 2 Documented in Chart      Disposition: to [facility] when bed available.   Follow-up Appts: Discharge Orders    Future Orders Please Complete By Expires   Diet - low sodium heart healthy      Diet Carb Modified      Increase activity slowly         Follow-up with Your regular doctor in 1-2 weeks after discharge from [facility] for further management of your diabetes.   Signed: Dujuan Stankowski 08/04/2011, 9:59 AM

## 2011-08-04 NOTE — Progress Notes (Signed)
Per RN, Pt ready for d/c, as MD has written D/c summary.  Contacted [facility].  Spoke with Tim.  Tim to review Pt info and check bed status.  Tim to call CSW back regarding bed status.  [place] [place], [place] [phone] Weekend coverage
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
## 2011-08-04 NOTE — BH Assessment (Incomplete)
Assessment Note   [name] is an 50 y.o. female.   {Axis Diagnosis:3049000}  Past Medical History:  Past Medical History  Diagnosis Date  . Diabetes mellitus   . Hypertension   . Anemia   . Chronic back pain   . Depression   . Neuropathy   . Hypercholesterolemia     No past surgical history on file.  Family History: No family history on file.  Social History:  reports that she has been smoking Cigarettes.  She has a 10 pack-year smoking history. She does not have any smokeless tobacco history on file. She reports that she does not drink alcohol or use illicit drugs.  Allergies:  Allergies  Allergen Reactions  . Celecoxib Nausea Only  . Ibuprofen Nausea Only    Home Medications:  Medications Prior to Admission  Medication Dose Route Frequency Provider Last Rate Last Dose  . acetaminophen (TYLENOL) tablet 650 mg  650 mg Oral Q6H PRN Manson Passey, MD       Or  . acetaminophen (TYLENOL) suppository 650 mg  650 mg Rectal Q6H PRN Manson Passey, MD      . ALPRAZolam Prudy Feeler) tablet 0.5 mg  0.5 mg Oral BID PRN Manson Passey, MD      . amLODipine (NORVASC) tablet 10 mg  10 mg Oral Daily Manson Passey, MD   10 mg at 08/04/11 1045  . citalopram (CELEXA) tablet 20 mg  20 mg Oral QHS Manson Passey, MD   20 mg at 08/03/11 2114  . DULoxetine (CYMBALTA) DR capsule 60 mg  60 mg Oral Daily Manson Passey, MD   60 mg at 08/04/11 1045  . enalapril (VASOTEC) tablet 20 mg  20 mg Oral Daily Manson Passey, MD   20 mg at 08/04/11 1045  . enoxaparin (LOVENOX) injection 40 mg  40 mg Subcutaneous Q24H Manson Passey, MD   40 mg at 08/04/11 1457  . glucose-Vitamin C 4-0.006 GM per chewable tablet           . HYDROcodone-acetaminophen (NORCO) 5-325 MG per tablet 1-2 tablet  1-2 tablet Oral Q6H PRN Manson Passey, MD   2 tablet at 08/04/11 1157  . insulin aspart (novoLOG) injection 0-9 Units  0-9 Units Subcutaneous Q4H Manson Passey, MD   5 Units at 08/04/11 1159  . insulin glargine (LANTUS) injection 35 Units  35  Units Subcutaneous QHS Hind I. Elsaid   35 Units at 08/03/11 2116  . metoCLOPramide (REGLAN) tablet 10 mg  10 mg Oral TID [facility] Manson Passey, MD   10 mg at 08/04/11 1201  . nicotine (NICODERM CQ - dosed in mg/24 hours) patch 21 mg  21 mg Transdermal Daily Keilani A. Lynch   21 mg at 08/04/11 1044  . ondansetron (ZOFRAN) tablet 4 mg  4 mg Oral Q6H PRN Manson Passey, MD   4 mg at 08/03/11 2156   Or  . ondansetron (ZOFRAN) injection 4 mg  4 mg Intravenous Q6H PRN Manson Passey, MD   4 mg at 07/31/11 1535  . senna-docusate (Senokot-S) tablet 1 tablet  1 tablet Oral Daily PRN Manson Passey, MD   1 tablet at 08/03/11 2114  . traZODone (DESYREL) tablet 100 mg  100 mg Oral QHS Manson Passey, MD   100 mg at 08/03/11 2114   Medications Prior to Admission  Medication Sig Dispense Refill  . ALPRAZolam (XANAX) 0.5 MG tablet Take 1 tablet (0.5 mg total) by mouth 2 (two) times daily as needed. For anxiety  20 tablet  0  . amLODipine (NORVASC) 10 MG tablet Take 1 tablet (10 mg total) by mouth daily.  30 tablet  0  . HYDROcodone-acetaminophen (VICODIN) 5-500 MG per tablet Take 1 tablet by mouth daily as needed. For pain  15 tablet  0  . insulin glargine (LANTUS) 100 UNIT/ML injection Inject 28 Units into the skin at bedtime.  10 mL  0    OB/GYN Status:  No LMP recorded.     Risk to self Depression: No (no current depression) Depression Symptoms:  (no current depression)                                       Disposition:  Disposition Disposition of Patient: Inpatient treatment program (Pt has been accepted by Dr. Elsie Saas to [facility] Rm [phone])  On Site Evaluation by:   Reviewed with Physician:     Melynda Ripple [facility] 08/04/2011 3:36 PM

## 2011-08-04 NOTE — Progress Notes (Signed)
CRITICAL VALUE ALERT  Critical value received:  CBG=41  Date of notification:  08/04/11 Time of notification: 0537  Critical value read back:yes  Nurse who received alert: [place] Jezlyn Westerfield, RN MD notified (1st page): M. Burnadette Peter, NP  Time of first page:  [phone] MD notified (2nd page):  Time of second page:  Responding MD:  M. Burnadette Peter, NP Time MD responded:  [phone]

## 2011-08-04 NOTE — Progress Notes (Signed)
Patient had critical blood glucose of 41. Gave 15 gram carbohydrate snack. Rechecked in 15 minutes, CBG increased to 68. Will recheck in 15-20 minutes. Notified Elray Mcgregor, NP. [place] Shanna Un, RN

## 2011-08-05 LAB — GLUCOSE, CAPILLARY
Glucose-Capillary: 174 mg/dL — ABNORMAL HIGH (ref 70–99)
Glucose-Capillary: 222 mg/dL — ABNORMAL HIGH (ref 70–99)
Glucose-Capillary: 232 mg/dL — ABNORMAL HIGH (ref 70–99)
Glucose-Capillary: 338 mg/dL — ABNORMAL HIGH (ref 70–99)
Glucose-Capillary: 462 mg/dL — ABNORMAL HIGH (ref 70–99)
Glucose-Capillary: 67 mg/dL — ABNORMAL LOW (ref 70–99)
Glucose-Capillary: 96 mg/dL (ref 70–99)

## 2011-08-05 LAB — BASIC METABOLIC PANEL
Calcium: 9.2 mg/dL (ref 8.4–10.5)
GFR calc non Af Amer: >90 mL/min (ref >90)
Glucose, Bld: 47 mg/dL — ABNORMAL LOW (ref 70–99)
Potassium: 3.6 mEq/L (ref 3.5–5.1)
Sodium: 139 mEq/L (ref 135–145)

## 2011-08-05 LAB — CBC
Hemoglobin: 12.8 g/dL (ref 12.0–15.0)
MCH: 29.8 pg (ref 26.0–34.0)
MCHC: 33.8 g/dL (ref 30.0–36.0)
Platelets: 214 10*3/uL (ref 150–400)
RBC: 4.3 MIL/uL (ref 3.87–5.11)

## 2011-08-05 MED ORDER — INSULIN GLARGINE 100 UNIT/ML ~~LOC~~ SOLN
24.0000 [IU] | Freq: Every day | SUBCUTANEOUS | Status: DC
  Administered 2011-08-05: 24 [IU] via SUBCUTANEOUS

## 2011-08-05 NOTE — Progress Notes (Signed)
  [name] is a 50 y.o. female [id number] 21-Dec-1960  Subjective/Objective: Patient still depressed and verbalized having the suicidal ideations without any significant plan. She still anxious. Patient is very concerned about her social situation. I spoke with Dr. Venetia Constable as per him patient is medically stable and he will followup with patient once patient go to behavioral health.    History   Social History  . Marital Status: Single    Spouse Name: N/A    Number of Children: N/A  . Years of Education: N/A   Social History Main Topics  . Smoking status: Current Everyday Smoker -- 0.5 packs/day for 20 years    Types: Cigarettes  . Smokeless tobacco: None  . Alcohol Use: No  . Drug Use: No  . Sexually Active: No   Other Topics Concern  . None   Social History Narrative  . None      Lab Results:   BMET    Component Value Date/Time   NA 139 08/05/2011 0352   K 3.6 08/05/2011 0352   CL 103 08/05/2011 0352   CO2 30 08/05/2011 0352   GLUCOSE 47* 08/05/2011 0352   BUN 13 08/05/2011 0352   CREATININE 0.64 08/05/2011 0352   CALCIUM 9.2 08/05/2011 0352   GFRNONAA >90 08/05/2011 0352   GFRAA >90 08/05/2011 0352    Medications:  Scheduled:     . amLODipine  10 mg Oral Daily  . citalopram  20 mg Oral QHS  . DULoxetine  60 mg Oral Daily  . enalapril  20 mg Oral Daily  . enoxaparin  40 mg Subcutaneous Q24H  . insulin aspart  0-9 Units Subcutaneous Q4H  . insulin glargine  24 Units Subcutaneous QHS  . metoCLOPramide  10 mg Oral TID AC  . nicotine  21 mg Transdermal Daily  . traZODone  100 mg Oral QHS  . DISCONTD: insulin glargine  28 Units Subcutaneous QHS  . DISCONTD: insulin glargine  35 Units Subcutaneous QHS     PRN Meds acetaminophen, acetaminophen, ALPRAZolam, HYDROcodone-acetaminophen, ondansetron (ZOFRAN) IV, ondansetron, senna-docusate   Assessment/Plan: Patient can be transferred to  [facility] once medically stable.   LOS: 2 days    Markiyah Gahm S 08/05/2011

## 2011-08-05 NOTE — Progress Notes (Signed)
Pt spoke with chaplain about family stress factors - Husband's alcoholism, grief over daughters. Spoke about fear and anxiety over home situation and expressed grief and disappointment in not being able to care for daughters better.    Provided support and explored resources with pt.  Pt spoke briefly with chaplain about faith and where she is finding / not finding God in the midst of her struggles.    08/05/11 1557  Clinical Encounter Type  Visited With Patient  Visit Type Follow-up;Psychological support;Spiritual support;Social support  Referral From Nurse  Spiritual Encounters  Spiritual Needs Emotional;Grief support;Prayer  Stress Factors  Patient Stress Factors Family relationships;Exhausted

## 2011-08-05 NOTE — Progress Notes (Signed)
Patient has cbg of 57, gave patient 15 gram carbohydrate snack. No complaints of polyuria, polydipsia, or polyphagia. Re-checked in 15 min, cbg increased to 67. Notified M. Lynch FNP. Will re-check in 15-20 min and monitor throughout shift. [name], [place] 1610, 08/05/11

## 2011-08-05 NOTE — Progress Notes (Signed)
Patient has low normal blood sugars at times but her mentation remains intact. I was notified by CSW that [facility] was uncomfortable taking patient till she is more stable clinically. It seems like the patient overstated her Lantus dose, and I have reduced it to 24 units qhs. I suspect that there will continue to be fluctuations in the Blood sugar numbers but since patient otherwise doing OK, she will not benefit from continued stay in a medical unit. Her insulin regimen can be optimized gradually over time but this should not necessitate further medical hospitalization. Today the patient is very tearful and says she is depressed. I have called [facility] patient assessment to clarify reason for hesitancy to accept patient. The coordinator promised to discuss with there psych director and get back to me. Meanwhile we will continue supportive care. Marcene Duos, MD pager [phone].

## 2011-08-05 NOTE — Progress Notes (Signed)
CSW made call to [facility] to facilitate patient's return as she is medically ready to be discharged. [facility] informed CSW that they cannot take pt due to her medical instability and blood sugar bottoming out. CSW informed Dr. Venetia Constable.

## 2011-08-06 LAB — GLUCOSE, CAPILLARY
Glucose-Capillary: 56 mg/dL — ABNORMAL LOW (ref 70–99)
Glucose-Capillary: 64 mg/dL — ABNORMAL LOW (ref 70–99)
Glucose-Capillary: 289 mg/dL — ABNORMAL HIGH (ref 70–99)
Glucose-Capillary: 267 mg/dL — ABNORMAL HIGH (ref 70–99)

## 2011-08-06 MED ORDER — INSULIN GLARGINE 100 UNIT/ML ~~LOC~~ SOLN
20.0000 [IU] | Freq: Every day | SUBCUTANEOUS | Status: DC
  Administered 2011-08-06 – 2011-08-08 (×3): 20 [IU] via SUBCUTANEOUS

## 2011-08-06 MED ORDER — INSULIN ASPART 100 UNIT/ML ~~LOC~~ SOLN
0.0000 [IU] | Freq: Three times a day (TID) | SUBCUTANEOUS | Status: DC
  Administered 2011-08-06 (×2): 5 [IU] via SUBCUTANEOUS
  Administered 2011-08-07 (×2): 3 [IU] via SUBCUTANEOUS
  Administered 2011-08-08: 7 [IU] via SUBCUTANEOUS
  Administered 2011-08-08: 5 [IU] via SUBCUTANEOUS
  Administered 2011-08-08: 2 [IU] via SUBCUTANEOUS
  Administered 2011-08-09: 3 [IU] via SUBCUTANEOUS
  Administered 2011-08-09 (×2): 7 [IU] via SUBCUTANEOUS
  Filled 2011-08-06: qty 3

## 2011-08-06 NOTE — Progress Notes (Signed)
Occupational Therapy Note  Attempted to see patient for treatment session but patient declines getting up with OT. She states she is feeling depressed. Informed nursing of patient's response. Will try back at a different time.  Judithann Sauger OTR/L [phone]

## 2011-08-06 NOTE — Progress Notes (Signed)
[name] is a 50 y.o. female patient. Please see d/c summary on 08/04/11.  SUBJECTIVE Patient tearful. Per CSW, [facility] will not be able to take patient and CSW is looking at alternative facilities.   1. Tobacco abuse     Past Medical History  Diagnosis Date  . Diabetes mellitus   . Hypertension   . Anemia   . Chronic back pain   . Depression   . Neuropathy   . Hypercholesterolemia    Current Facility-Administered Medications  Medication Dose Route Frequency Provider Last Rate Last Dose  . acetaminophen (TYLENOL) tablet 650 mg  650 mg Oral Q6H PRN Elizabeth Passey, MD       Or  . acetaminophen (TYLENOL) suppository 650 mg  650 mg Rectal Q6H PRN Elizabeth Passey, MD      . ALPRAZolam Prudy Feeler) tablet 0.5 mg  0.5 mg Oral BID PRN Elizabeth Passey, MD   0.5 mg at 08/05/11 2016  . amLODipine (NORVASC) tablet 10 mg  10 mg Oral Daily Elizabeth Passey, MD   10 mg at 08/06/11 0915  . citalopram (CELEXA) tablet 20 mg  20 mg Oral QHS Elizabeth Passey, MD   20 mg at 08/05/11 2145  . DULoxetine (CYMBALTA) Elizabeth capsule 60 mg  60 mg Oral Daily Elizabeth Passey, MD   60 mg at 08/06/11 0915  . enalapril (VASOTEC) tablet 20 mg  20 mg Oral Daily Elizabeth Passey, MD   20 mg at 08/06/11 0915  . enoxaparin (LOVENOX) injection 40 mg  40 mg Subcutaneous Q24H Elizabeth Passey, MD   40 mg at 08/05/11 1425  . HYDROcodone-acetaminophen (NORCO) 5-325 MG per tablet 1-2 tablet  1-2 tablet Oral Q6H PRN Elizabeth Passey, MD   2 tablet at 08/06/11 1151  . insulin aspart (novoLOG) injection 0-9 Units  0-9 Units Subcutaneous TID [facility] [name]   5 Units at 08/06/11 1138  . insulin glargine (LANTUS) injection 20 Units  20 Units Subcutaneous QHS [name]      . metoCLOPramide (REGLAN) tablet 10 mg  10 mg Oral TID [name] [place] Elizabeth Passey, MD   10 mg at 08/06/11 1138  . nicotine (NICODERM CQ - dosed in mg/24 hours) patch 21 mg  21 mg Transdermal Daily [name]   21 mg at 08/06/11 0915  . ondansetron (ZOFRAN) tablet 4 mg  4 mg Oral Q6H PRN Elizabeth Passey, MD   4  mg at 08/03/11 2156   Or  . ondansetron (ZOFRAN) injection 4 mg  4 mg Intravenous Q6H PRN Elizabeth Passey, MD   4 mg at 07/31/11 1535  . senna-docusate (Senokot-S) tablet 1 tablet  1 tablet Oral Daily PRN Elizabeth Passey, MD   1 tablet at 08/03/11 2114  . traZODone (DESYREL) tablet 100 mg  100 mg Oral QHS Elizabeth Passey, MD   100 mg at 08/05/11 2145  . DISCONTD: insulin aspart (novoLOG) injection 0-9 Units  0-9 Units Subcutaneous Q4H Elizabeth Passey, MD   9 Units at 08/05/11 2016  . DISCONTD: insulin glargine (LANTUS) injection 24 Units  24 Units Subcutaneous QHS [name]   24 Units at 08/05/11 2145   Allergies  Allergen Reactions  . Celecoxib Nausea Only  . Ibuprofen Nausea Only   Principal Problem:  *Diabetes mellitus Active Problems:  Depression  HTN (hypertension)   Vital signs in last 24 hours: Temp:  [97.9 F (36.6 C)-98.9 F (37.2 C)] 98.9 F (37.2 C) (11/13 0555) Pulse Rate:  [69-70] 70  (11/13 0555) Resp:  [15-18]  18  (11/13 0555) BP: (106-144)/(66-79) 144/72 mmHg (11/13 0915) SpO2:  [95 %-97 %] 95 % (11/13 0555) Weight change:  Last BM Date: 07/31/11  Intake/Output from previous day: 11/12 0701 - 11/13 0700 In: 2280 [P.O.:2280] Out: 3200 [Urine:3200] Intake/Output this shift: Total I/O In: 480 [P.O.:480] Out: 600 [Urine:600]  Lab Results:  [facility] 08/05/11 0352 08/04/11 0435  WBC 6.2 6.4  HGB 12.8 12.8  HCT 37.9 38.5  PLT 214 212   BMET  Basename 08/05/11 0352 08/04/11 0435  NA 139 139  K 3.6 3.5  CL 103 102  CO2 30 30  GLUCOSE 47* 41*  BUN 13 13  CREATININE 0.64 0.64  CALCIUM 9.2 9.1    Studies/Results: No results found.  Medications: I have reviewed the patient's current medications.   Physical exam GENERAL- alert, sad, angry and depressed HEAD- normal atraumatic, no neck masses, normal thyroid, no jvd RESPIRATORY- chest clear, no wheezing, crepitations, rhonchi, normal symmetric air entry CVS- regular rate and rhythm, S1, S2 normal, no  murmur, click, rub or gallop ABDOMEN- abdomen is soft without significant tenderness, masses, organomegaly or guarding NEURO- Grossly normal EXTREMITIES- extremities normal, atraumatic, no cyanosis or edema  Plan 1. Diabetes mellitus- better controlled, some episodes of hypoglycemia in the mornings. I don't see need for continued monitoring in the hospital. Lantus reduced to 20 units qhs. Avoid q4hrly checks- creating a vicious cycle. I have discussed this with nursing staff. 2. Depression- defer management to [name]. Patient medically stable for transfer to Psychiatrywhen bed becomes available. Await identification of appropriate psychiatric facility for d/c. 3. HTN (hypertension)-stable.  4. DVT/GI prophylaxis.     [name] 08/06/2011 1:14 PM Pager: [phone].

## 2011-08-06 NOTE — Progress Notes (Signed)
Patient had a blood sugar of 51, gave 15 gram carbohydrate snack. CBG rechecked in 15 minutes, increased to 68. Patient not exhibiting polyuria, polydipsia, or polyphagia. Notified Dr. Raquel James, stated that patients blood sugar will be addressed. Will continue to monitor patient throughout shift. [place] Zarin Knupp, RN

## 2011-08-06 NOTE — Progress Notes (Signed)
Physical Therapy Treatment Patient Details Name: [name] MRN: [id number] DOB: 25-May-1961 Today's Date: 08/06/2011 [id number] 1Gt  PT Assessment/Plan  PT - Assessment/Plan Comments on Treatment Session: Patient will benefit from continued use of rolling walker due to limitations with back pain ambulating without device.  Continues with short shuffling pattern and widened base of support, but reported increased pain with increased step length.  She also mentioned wanting to get a CBG meter when she goes home. PT Plan: Discharge plan remains appropriate PT Frequency: Min 3X/week Follow Up Recommendations: Other (comment) (return to inpatient psych hospital) Equipment Recommended: Rolling walker with 5" wheels PT Goals  Acute Rehab PT Goals PT Goal: Supine/Side to Sit - Progress: Met PT Transfer Goal: Sit to Stand/Stand to Sit - Progress: Met PT Goal: Ambulate - Progress: Progressing toward goal  PT Treatment Precautions/Restrictions  Precautions Precautions: Fall (suicide) Restrictions Weight Bearing Restrictions: No Mobility (including Balance) Bed Mobility Supine to Sit: 7: Independent Sit to Supine - Left: 7: Independent Transfers Sit to Stand: 7: Independent Stand to Sit: 7: Independent Ambulation/Gait Ambulation/Gait Assistance: 5: Supervision Ambulation Distance (Feet): 280 Feet Assistive device: None Gait Pattern: Shuffle;Decreased stride length (increased lateral sway with increased BOS)  Posture/Postural Control Posture/Postural Control: Postural limitations Postural Limitations: bilateral feet abnormality with shortened toes, and increased arch with varus curve and small size.  Educated patient on options for fitting for diabetic shoes with numbers for two local orthotists given. Exercise    End of Session PT - End of Session Activity Tolerance: Patient limited by pain Patient left: Other (comment) (sitter present) General Behavior During Session: [facility] for  tasks performed Cognition: [facility] for tasks performed  [facility] 08/06/2011, 4:11 PM

## 2011-08-07 LAB — GLUCOSE, CAPILLARY
Glucose-Capillary: 186 mg/dL — ABNORMAL HIGH (ref 70–99)
Glucose-Capillary: 415 mg/dL — ABNORMAL HIGH (ref 70–99)
Glucose-Capillary: 435 mg/dL — ABNORMAL HIGH (ref 70–99)

## 2011-08-07 MED ORDER — INSULIN GLARGINE 100 UNIT/ML ~~LOC~~ SOLN: 20.0000 [IU] | Freq: Every day | SUBCUTANEOUS | Status: DC

## 2011-08-07 MED ORDER — INSULIN ASPART 100 UNIT/ML ~~LOC~~ SOLN: 4.0000 [IU] | Freq: Three times a day (TID) | SUBCUTANEOUS | Status: DC

## 2011-08-07 NOTE — Progress Notes (Signed)
Pt unable to return to [facility]. Per Patsy Lager in admissions, pt was declined a bed by [facility] medical director. CSW pursuing other inpt psych facilities. CSW will continue to follow.  Dellie Burns, MSW, LCSWA [phone] (psych cover)
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
## 2011-08-08 LAB — GLUCOSE, CAPILLARY
Glucose-Capillary: 235 mg/dL — ABNORMAL HIGH (ref 70–99)
Glucose-Capillary: 319 mg/dL — ABNORMAL HIGH (ref 70–99)
Glucose-Capillary: 264 mg/dL — ABNORMAL HIGH (ref 70–99)

## 2011-08-08 MED ORDER — SALINE SPRAY 0.65 % NA SOLN
1.0000 | NASAL | Status: DC | PRN
  Filled 2011-08-08: qty 44

## 2011-08-08 MED ORDER — LORATADINE 10 MG PO TABS
10.0000 mg | ORAL_TABLET | Freq: Every day | ORAL | Status: DC
  Administered 2011-08-08 – 2011-08-09 (×2): 10 mg via ORAL
  Filled 2011-08-08 (×3): qty 1

## 2011-08-08 NOTE — Progress Notes (Addendum)
OT Note Chart reviewed and noted that pt was cleared for D/C to [facility] on 08/07/11 w/ note in chart today (08/08/11) stating pt cleared for D/C home as she is medically stable.  No further OT recommendations at this time secondary to pt being d/c'd, sign off OT.

## 2011-08-08 NOTE — Progress Notes (Signed)
Patient is medically stable, she was denied the patient can go home instead of behavioral health. By Dr.Ahluwalia's recommendation she will need behavioral health. She also states that she is congested today. Will order a nasal saline spray, and Claritin.

## 2011-08-09 ENCOUNTER — Encounter (HOSPITAL_COMMUNITY): Payer: Self-pay

## 2011-08-09 ENCOUNTER — Inpatient Hospital Stay (HOSPITAL_COMMUNITY)
Admission: AD | Admit: 2011-08-09 | Discharge: 2011-08-11 | DRG: 885 | Disposition: A | Discharge status: Other Acute Inpatient Hospital | Payer: Medicaid Other | Source: Ambulatory Visit | Attending: Family Medicine | Admitting: Family Medicine

## 2011-08-09 DIAGNOSIS — G8929 Other chronic pain: Secondary | ICD-10-CM

## 2011-08-09 DIAGNOSIS — Z720 Tobacco use: Secondary | ICD-10-CM

## 2011-08-09 DIAGNOSIS — I10 Essential (primary) hypertension: Secondary | ICD-10-CM

## 2011-08-09 DIAGNOSIS — F4001 Agoraphobia with panic disorder: Secondary | ICD-10-CM

## 2011-08-09 DIAGNOSIS — M549 Dorsalgia, unspecified: Secondary | ICD-10-CM

## 2011-08-09 DIAGNOSIS — G589 Mononeuropathy, unspecified: Secondary | ICD-10-CM

## 2011-08-09 DIAGNOSIS — Z79899 Other long term (current) drug therapy: Secondary | ICD-10-CM

## 2011-08-09 DIAGNOSIS — F411 Generalized anxiety disorder: Secondary | ICD-10-CM

## 2011-08-09 DIAGNOSIS — F329 Major depressive disorder, single episode, unspecified: Secondary | ICD-10-CM | POA: Diagnosis present

## 2011-08-09 DIAGNOSIS — IMO0001 Reserved for inherently not codable concepts without codable children: Secondary | ICD-10-CM

## 2011-08-09 DIAGNOSIS — E7800 Pure hypercholesterolemia, unspecified: Secondary | ICD-10-CM

## 2011-08-09 DIAGNOSIS — F332 Major depressive disorder, recurrent severe without psychotic features: Principal | ICD-10-CM

## 2011-08-09 DIAGNOSIS — Z794 Long term (current) use of insulin: Secondary | ICD-10-CM

## 2011-08-09 DIAGNOSIS — F17200 Nicotine dependence, unspecified, uncomplicated: Secondary | ICD-10-CM

## 2011-08-09 HISTORY — DX: Major depressive disorder, recurrent, unspecified: F33.9

## 2011-08-09 HISTORY — DX: Generalized anxiety disorder: F41.1

## 2011-08-09 HISTORY — DX: Agoraphobia with panic disorder: F40.01

## 2011-08-09 LAB — GLUCOSE, CAPILLARY
Glucose-Capillary: 354 mg/dL — ABNORMAL HIGH (ref 70–99)
Glucose-Capillary: 319 mg/dL — ABNORMAL HIGH (ref 70–99)
Glucose-Capillary: 326 mg/dL — ABNORMAL HIGH (ref 70–99)

## 2011-08-09 MED ORDER — METOCLOPRAMIDE HCL 10 MG PO TABS
10.0000 mg | ORAL_TABLET | Freq: Three times a day (TID) | ORAL | Status: DC
  Administered 2011-08-10 – 2011-08-11 (×4): 10 mg via ORAL
  Filled 2011-08-09 (×9): qty 1

## 2011-08-09 MED ORDER — INSULIN GLARGINE 100 UNIT/ML ~~LOC~~ SOLN
25.0000 [IU] | Freq: Every day | SUBCUTANEOUS | Status: DC
  Filled 2011-08-09: qty 3

## 2011-08-09 MED ORDER — LORATADINE 10 MG PO TABS: 10.0000 mg | ORAL_TABLET | Freq: Every day | ORAL | Status: DC

## 2011-08-09 MED ORDER — INSULIN GLARGINE 100 UNIT/ML ~~LOC~~ SOLN: 25.0000 [IU] | Freq: Every day | SUBCUTANEOUS | Status: DC

## 2011-08-09 MED ORDER — TRAZODONE HCL 100 MG PO TABS
100.0000 mg | ORAL_TABLET | Freq: Every day | ORAL | Status: DC
  Administered 2011-08-09 – 2011-08-11 (×2): 100 mg via ORAL
  Filled 2011-08-09 (×5): qty 1

## 2011-08-09 MED ORDER — ACETAMINOPHEN 325 MG PO TABS: 650.0000 mg | ORAL_TABLET | Freq: Four times a day (QID) | ORAL | Status: DC | PRN

## 2011-08-09 MED ORDER — SENNOSIDES-DOCUSATE SODIUM 8.6-50 MG PO TABS: 1.0000 | ORAL_TABLET | Freq: Every day | ORAL | Status: DC | PRN

## 2011-08-09 MED ORDER — ALPRAZOLAM 0.5 MG PO TABS
0.5000 mg | ORAL_TABLET | Freq: Two times a day (BID) | ORAL | Status: DC | PRN
  Administered 2011-08-09 – 2011-08-11 (×4): 0.5 mg via ORAL
  Filled 2011-08-09 (×4): qty 1

## 2011-08-09 MED ORDER — ENALAPRIL MALEATE 20 MG PO TABS
20.0000 mg | ORAL_TABLET | Freq: Every day | ORAL | Status: DC
  Administered 2011-08-10 – 2011-08-11 (×2): 20 mg via ORAL
  Filled 2011-08-09 (×4): qty 1

## 2011-08-09 MED ORDER — CITALOPRAM HYDROBROMIDE 20 MG PO TABS
20.0000 mg | ORAL_TABLET | Freq: Every day | ORAL | Status: DC
  Administered 2011-08-09: 20 mg via ORAL
  Filled 2011-08-09 (×6): qty 1

## 2011-08-09 MED ORDER — SALINE SPRAY 0.65 % NA SOLN
1.0000 | NASAL | Status: DC | PRN
  Filled 2011-08-09: qty 44

## 2011-08-09 MED ORDER — LORATADINE 10 MG PO TABS
10.0000 mg | ORAL_TABLET | Freq: Every day | ORAL | Status: DC
  Administered 2011-08-10 – 2011-08-11 (×2): 10 mg via ORAL
  Filled 2011-08-09 (×3): qty 1

## 2011-08-09 MED ORDER — DULOXETINE HCL 60 MG PO CPEP
60.0000 mg | ORAL_CAPSULE | Freq: Every day | ORAL | Status: DC
  Administered 2011-08-10 – 2011-08-11 (×2): 60 mg via ORAL
  Filled 2011-08-09 (×3): qty 1

## 2011-08-09 MED ORDER — AMLODIPINE BESYLATE 10 MG PO TABS
10.0000 mg | ORAL_TABLET | Freq: Every day | ORAL | Status: DC
  Administered 2011-08-10 – 2011-08-11 (×2): 10 mg via ORAL
  Filled 2011-08-09 (×4): qty 1

## 2011-08-09 MED ORDER — INSULIN ASPART 100 UNIT/ML ~~LOC~~ SOLN
0.0000 [IU] | Freq: Three times a day (TID) | SUBCUTANEOUS | Status: DC
  Administered 2011-08-10: 5 [IU] via SUBCUTANEOUS
  Administered 2011-08-10: 3 [IU] via SUBCUTANEOUS
  Filled 2011-08-09: qty 3

## 2011-08-09 MED ORDER — INSULIN GLARGINE 100 UNIT/ML ~~LOC~~ SOLN
25.0000 [IU] | Freq: Every day | SUBCUTANEOUS | Status: DC
  Administered 2011-08-09: 25 [IU] via SUBCUTANEOUS
  Filled 2011-08-09: qty 3

## 2011-08-09 MED ORDER — HYDROCODONE-ACETAMINOPHEN 5-325 MG PO TABS
1.0000 | ORAL_TABLET | Freq: Four times a day (QID) | ORAL | Status: DC | PRN
  Administered 2011-08-10 (×2): 2 via ORAL
  Administered 2011-08-11: 1 via ORAL
  Filled 2011-08-09: qty 2
  Filled 2011-08-09: qty 1
  Filled 2011-08-09: qty 2

## 2011-08-09 MED ORDER — AMLODIPINE BESYLATE 10 MG PO TABS: 10.0000 mg | ORAL_TABLET | Freq: Every day | ORAL | Status: DC

## 2011-08-09 MED ORDER — INSULIN ASPART 100 UNIT/ML ~~LOC~~ SOLN: 4.0000 [IU] | Freq: Three times a day (TID) | SUBCUTANEOUS | Status: DC

## 2011-08-09 MED ORDER — INSULIN GLARGINE 100 UNIT/ML ~~LOC~~ SOLN
8.0000 [IU] | Freq: Once | SUBCUTANEOUS | Status: AC
  Administered 2011-08-09: 8 [IU] via SUBCUTANEOUS

## 2011-08-09 MED ORDER — NICOTINE 21 MG/24HR TD PT24
21.0000 mg | MEDICATED_PATCH | Freq: Every day | TRANSDERMAL | Status: DC
  Administered 2011-08-10 – 2011-08-11 (×2): 21 mg via TRANSDERMAL
  Filled 2011-08-09 (×3): qty 1

## 2011-08-09 MED ORDER — INSULIN GLARGINE 100 UNIT/ML ~~LOC~~ SOLN: 20.0000 [IU] | Freq: Every day | SUBCUTANEOUS | Status: DC

## 2011-08-09 MED ORDER — ACETAMINOPHEN 650 MG RE SUPP: 650.0000 mg | Freq: Four times a day (QID) | RECTAL | Status: DC | PRN

## 2011-08-09 NOTE — Progress Notes (Signed)
  [name] is a 50 y.o. female [id number] 11-30-1960  Subjective/Objective: Patient is doing good medically. But she still depressed anxious and had passive suicidal ideations but told me that the as behavioral health don't want  to take her so she wants to go home but I told her that her depression needs further stabilization , will speak with the behavioral health for transfer. I will recommend the medical doctor to speak with the [place] administration to speak with the administration of the behavioral health regarding her transfer. As patient was transferred from the behavioral health she should go back there for further stabilization.    History   Social History  . Marital Status: Single    Spouse Name: N/A    Number of Children: N/A  . Years of Education: N/A   Social History Main Topics  . Smoking status: Current Everyday Smoker -- 0.5 packs/day for 20 years    Types: Cigarettes  . Smokeless tobacco: None  . Alcohol Use: No  . Drug Use: No  . Sexually Active: No   Other Topics Concern  . None   Social History Narrative  . None     Lab Results:   BMET    Component Value Date/Time   NA 139 08/05/2011 0352   K 3.6 08/05/2011 0352   CL 103 08/05/2011 0352   CO2 30 08/05/2011 0352   GLUCOSE 47* 08/05/2011 0352   BUN 13 08/05/2011 0352   CREATININE 0.64 08/05/2011 0352   CALCIUM 9.2 08/05/2011 0352   GFRNONAA >90 08/05/2011 0352   GFRAA >90 08/05/2011 0352    Medications:  Scheduled:     . amLODipine  10 mg Oral Daily  . citalopram  20 mg Oral QHS  . DULoxetine  60 mg Oral Daily  . enalapril  20 mg Oral Daily  . enoxaparin  40 mg Subcutaneous Q24H  . insulin aspart  0-9 Units Subcutaneous TID WC  . insulin glargine  25 Units Subcutaneous QHS  . insulin glargine  8 Units Subcutaneous Once  . loratadine  10 mg Oral Daily  . metoCLOPramide  10 mg Oral TID AC  . nicotine  21 mg Transdermal Daily  . traZODone  100 mg Oral QHS  . DISCONTD:  insulin glargine  20 Units Subcutaneous QHS     PRN Meds acetaminophen, acetaminophen, ALPRAZolam, HYDROcodone-acetaminophen, ondansetron (ZOFRAN) IV, ondansetron, senna-docusate, sodium chloride   Plan: Continue current treatment plan.     AHLUWALIA,SHAMSHER S 08/09/2011

## 2011-08-09 NOTE — Progress Notes (Signed)
CSW reviewed last note from psych.  Sent clinicals to [facility].  Awaiting acceptance/bed availability.  CSW will continue to follow to assist with d/c planning.   Contact [phone]
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
## 2011-08-10 ENCOUNTER — Encounter (HOSPITAL_COMMUNITY): Payer: Self-pay

## 2011-08-10 DIAGNOSIS — F411 Generalized anxiety disorder: Secondary | ICD-10-CM

## 2011-08-10 DIAGNOSIS — F332 Major depressive disorder, recurrent severe without psychotic features: Principal | ICD-10-CM

## 2011-08-10 LAB — BASIC METABOLIC PANEL
CO2: 24 mEq/L (ref 19–32)
Calcium: 9.6 mg/dL (ref 8.4–10.5)
Chloride: 92 mEq/L — ABNORMAL LOW (ref 96–112)
Glucose, Bld: 483 mg/dL — ABNORMAL HIGH (ref 70–99)
Potassium: 3.9 mEq/L (ref 3.5–5.1)
Sodium: 128 mEq/L — ABNORMAL LOW (ref 135–145)

## 2011-08-10 LAB — DIFFERENTIAL
Basophils Absolute: 0.1 10*3/uL (ref 0.0–0.1)
Lymphocytes Relative: 30 % (ref 12–46)
Lymphs Abs: 2.9 10*3/uL (ref 0.7–4.0)
Neutro Abs: 5.9 10*3/uL (ref 1.7–7.7)
Neutrophils Relative %: 61 % (ref 43–77)

## 2011-08-10 LAB — GLUCOSE, CAPILLARY
Glucose-Capillary: 223 mg/dL — ABNORMAL HIGH (ref 70–99)
Glucose-Capillary: 497 mg/dL — ABNORMAL HIGH (ref 70–99)
Glucose-Capillary: 566 mg/dL (ref 70–99)

## 2011-08-10 LAB — CBC
Platelets: 268 10*3/uL (ref 150–400)
RBC: 4.68 MIL/uL (ref 3.87–5.11)
RDW: 12.5 % (ref 11.5–15.5)
WBC: 9.8 10*3/uL (ref 4.0–10.5)

## 2011-08-10 MED ORDER — INSULIN ASPART 100 UNIT/ML ~~LOC~~ SOLN
13.0000 [IU] | Freq: Once | SUBCUTANEOUS | Status: AC
  Administered 2011-08-10: 13 [IU] via SUBCUTANEOUS

## 2011-08-10 MED ORDER — INSULIN GLARGINE 100 UNIT/ML ~~LOC~~ SOLN: 30.0000 [IU] | Freq: Every day | SUBCUTANEOUS | Status: DC

## 2011-08-10 NOTE — ED Notes (Signed)
[id number]<WU> Expected date:08/10/11<BR> Expected time: 9:22 PM<BR> Means of arrival:Ambulance<BR> Comments:<BR> EMS 32 Ptar - abd pain/swelling and tenderness

## 2011-08-10 NOTE — Progress Notes (Signed)
Suicide Risk Assessment  Admission Assessment     Demographic factors:   pt Current Mental Status:  admits si idiation now Loss Factors:  Unknown Historical Factors:   Prior suicide attempts; Risk Reduction Factors:  Lives with husband CLINICAL FACTORS:   Depression:   Hopelessness  COGNITIVE FEATURES THAT CONTRIBUTE TO RISK:  Closed-mindedness    SUICIDE RISK:   Moderate:  Frequent suicidal ideation with limited intensity, and duration, some specificity in terms of plans, no associated intent, good self-control, limited dysphoria/symptomatology, some risk factors present, and identifiable protective factors, including available and accessible social support.  PLAN OF CARE:  Continue to observe [facility] med & group therapy  Wonda Cerise 08/10/2011, 9:33 PM
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
## 2011-08-10 NOTE — Progress Notes (Signed)
[facility] Adult Inpatient Family/Significant Other Suicide Prevention Education  Suicide Prevention Education:  Contact Attempts: Fayrene Fearing [phone]( [phone]) has been identified by the patient as the family member/significant other with whom the patient will be residing, and identified as the person(s) who will aid the patient in the event of a mental health crisis.  With written consent from the patient, two attempts were made to provide suicide prevention education, prior to and/or following the patient's discharge.  We were unsuccessful in providing suicide prevention education.  A suicide education pamphlet was given to the patient to share with family/significant other.  Date and time of first attempt: by Lamar Blinks on 08/10/11 at 4;50 p.m Date and time of second attempt:  Neila Gear 08/10/2011, 5:46 PM
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
## 2011-08-10 NOTE — Progress Notes (Signed)
  Glucose level 495.  Ordered one-time dose of Novolog 13U sq.  STAT lab cancelled

## 2011-08-10 NOTE — Progress Notes (Addendum)
  S: Animal nutritionist, pt's CBG = 443.  Nurse has concerns regarding consistently high glucose readings since pt returned from tranfer to ED.    O: CBGs reviewed and pt averaging in 200s to 300s  A: Poorly controlled DM  P: Will give pre-meal dose of 9U Novolog and increase Lantus to 30U and change to am dosing to better enable monitoring.  Will cancel STAT labs

## 2011-08-10 NOTE — Progress Notes (Signed)
  H &P was reviewed and I agree with key elements and treatment plan. Please see H& P.

## 2011-08-10 NOTE — Progress Notes (Signed)
  08/10/2011 Nrsg 1900 D [name] has done a good job today . She has been OOB UAL on the hall, attending her groups, interacting with the other pts, going to her meals, and taking her meds as ordered by her MD. [name] observed transferring from w/c to bed, to standing position, ambulating to the BR, tolerated well. [name] much stronger when she ambulates and more alert and aware. Her affect remains labile, she cries easily and gets very upset when she speaks to family via phone call. A [name] medicated per MD order. CBG at 1230 443 and pt given 9 units novolog insulin per MD order.1730 CBG 497 and pt given 13 units novoloog per MD order. R Safety is maintaiend and POC includes continuing to foster therapeutic relationship PD RN [facility]

## 2011-08-10 NOTE — ED Notes (Signed)
BJY:[id number]<FA> Expected date:08/10/11<BR> Expected time: 9:40 PM<BR> Means of arrival:Ambulance<BR> Comments:<BR> [facility] transfer, Hyperglycemia

## 2011-08-10 NOTE — Tx Team (Signed)
Initial Interdisciplinary Treatment Plan  PATIENT STRENGTHS: (choose at least two) Ability for insight Motivation for treatment/growth  PATIENT STRESSORS: Financial difficulties Loss of daughter to DSS custody* Marital or family conflict   PROBLEM LIST: Problem List/Patient Goals Date to be addressed Date deferred Reason deferred Estimated date of resolution                                                         DISCHARGE CRITERIA:  Improved stabilization in mood, thinking, and/or behavior Medical problems require only outpatient monitoring Safe-care adequate arrangements made Verbal commitment to aftercare and medication compliance  PRELIMINARY DISCHARGE PLAN: Outpatient therapy  PATIENT/FAMIILY INVOLVEMENT: This treatment plan has been presented to and reviewed with the patient, [name].  The patient and family have been given the opportunity to ask questions and make suggestions.  Alma Mohiuddin Dyer 08/10/2011, 2340

## 2011-08-11 ENCOUNTER — Inpatient Hospital Stay (HOSPITAL_COMMUNITY)
Admission: EM | Admit: 2011-08-11 | Discharge: 2011-08-19 | DRG: 074 | Disposition: A | Discharge status: Home / Self Care | Payer: Medicaid Other | Source: Other Acute Inpatient Hospital | Attending: Internal Medicine | Admitting: Internal Medicine

## 2011-08-11 ENCOUNTER — Encounter (HOSPITAL_COMMUNITY): Payer: Self-pay | Admitting: Urology

## 2011-08-11 ENCOUNTER — Encounter (HOSPITAL_COMMUNITY): Payer: Self-pay | Admitting: Family Medicine

## 2011-08-11 DIAGNOSIS — Z794 Long term (current) use of insulin: Secondary | ICD-10-CM

## 2011-08-11 DIAGNOSIS — E7800 Pure hypercholesterolemia, unspecified: Secondary | ICD-10-CM | POA: Diagnosis present

## 2011-08-11 DIAGNOSIS — E1149 Type 2 diabetes mellitus with other diabetic neurological complication: Principal | ICD-10-CM | POA: Diagnosis present

## 2011-08-11 DIAGNOSIS — R0789 Other chest pain: Secondary | ICD-10-CM | POA: Diagnosis present

## 2011-08-11 DIAGNOSIS — E1142 Type 2 diabetes mellitus with diabetic polyneuropathy: Secondary | ICD-10-CM | POA: Diagnosis present

## 2011-08-11 DIAGNOSIS — F17200 Nicotine dependence, unspecified, uncomplicated: Secondary | ICD-10-CM | POA: Diagnosis present

## 2011-08-11 DIAGNOSIS — G8929 Other chronic pain: Secondary | ICD-10-CM | POA: Diagnosis present

## 2011-08-11 DIAGNOSIS — F339 Major depressive disorder, recurrent, unspecified: Secondary | ICD-10-CM | POA: Diagnosis present

## 2011-08-11 DIAGNOSIS — F4001 Agoraphobia with panic disorder: Secondary | ICD-10-CM | POA: Diagnosis present

## 2011-08-11 DIAGNOSIS — M549 Dorsalgia, unspecified: Secondary | ICD-10-CM | POA: Diagnosis present

## 2011-08-11 DIAGNOSIS — I10 Essential (primary) hypertension: Secondary | ICD-10-CM | POA: Diagnosis present

## 2011-08-11 LAB — GLUCOSE, CAPILLARY
Glucose-Capillary: 389 mg/dL — ABNORMAL HIGH (ref 70–99)
Glucose-Capillary: 329 mg/dL — ABNORMAL HIGH (ref 70–99)
Glucose-Capillary: 347 mg/dL — ABNORMAL HIGH (ref 70–99)
Glucose-Capillary: 118 mg/dL — ABNORMAL HIGH (ref 70–99)
Glucose-Capillary: 140 mg/dL — ABNORMAL HIGH (ref 70–99)
Glucose-Capillary: 203 mg/dL — ABNORMAL HIGH (ref 70–99)
Glucose-Capillary: 147 mg/dL — ABNORMAL HIGH (ref 70–99)
Glucose-Capillary: 138 mg/dL — ABNORMAL HIGH (ref 70–99)
Glucose-Capillary: 139 mg/dL — ABNORMAL HIGH (ref 70–99)
Glucose-Capillary: 160 mg/dL — ABNORMAL HIGH (ref 70–99)
Glucose-Capillary: 177 mg/dL — ABNORMAL HIGH (ref 70–99)

## 2011-08-11 LAB — BLOOD GAS, VENOUS
O2 Saturation: 49.3 %
pCO2, Ven: 50.9 mmHg — ABNORMAL HIGH (ref 45.0–50.0)
pH, Ven: 7.358 — ABNORMAL HIGH (ref 7.250–7.300)
pO2, Ven: 29.6 mmHg — CL (ref 30.0–45.0)

## 2011-08-11 LAB — BASIC METABOLIC PANEL
GFR calc Af Amer: >90 mL/min (ref >90)
GFR calc non Af Amer: >90 mL/min (ref >90)
Glucose, Bld: 201 mg/dL — ABNORMAL HIGH (ref 70–99)
Potassium: 3.7 mEq/L (ref 3.5–5.1)
Sodium: 135 mEq/L (ref 135–145)

## 2011-08-11 LAB — URINE MICROSCOPIC-ADD ON

## 2011-08-11 LAB — CBC
Hemoglobin: 12.3 g/dL (ref 12.0–15.0)
MCHC: 35.2 g/dL (ref 30.0–36.0)
RDW: 12.5 % (ref 11.5–15.5)
WBC: 6.4 10*3/uL (ref 4.0–10.5)

## 2011-08-11 LAB — URINALYSIS, ROUTINE W REFLEX MICROSCOPIC
Leukocytes, UA: NEGATIVE
Nitrite: NEGATIVE
Protein, ur: NEGATIVE mg/dL
Specific Gravity, Urine: 1.018 (ref 1.005–1.030)
Urobilinogen, UA: 0.2 mg/dL (ref 0.0–1.0)

## 2011-08-11 MED ORDER — ONDANSETRON HCL 4 MG/2ML IJ SOLN: 4.0000 mg | Freq: Four times a day (QID) | INTRAMUSCULAR | Status: DC | PRN

## 2011-08-11 MED ORDER — SODIUM CHLORIDE 0.9 % IV SOLN: INTRAVENOUS | Status: DC

## 2011-08-11 MED ORDER — ACETAMINOPHEN 325 MG PO TABS: 650.0000 mg | ORAL_TABLET | Freq: Four times a day (QID) | ORAL | Status: DC | PRN

## 2011-08-11 MED ORDER — SODIUM CHLORIDE 0.9 % IV SOLN
INTRAVENOUS | Status: DC
  Administered 2011-08-11: 08:00:00 via INTRAVENOUS

## 2011-08-11 MED ORDER — HYDROCODONE-ACETAMINOPHEN 5-325 MG PO TABS
1.0000 | ORAL_TABLET | Freq: Four times a day (QID) | ORAL | Status: DC | PRN
  Administered 2011-08-11 – 2011-08-19 (×16): 1 via ORAL
  Filled 2011-08-11 (×17): qty 1

## 2011-08-11 MED ORDER — ONDANSETRON HCL 4 MG PO TABS: 4.0000 mg | ORAL_TABLET | Freq: Four times a day (QID) | ORAL | Status: DC | PRN

## 2011-08-11 MED ORDER — ALPRAZOLAM 0.5 MG PO TABS
0.5000 mg | ORAL_TABLET | Freq: Two times a day (BID) | ORAL | Status: DC | PRN
  Administered 2011-08-11 – 2011-08-19 (×16): 0.5 mg via ORAL
  Filled 2011-08-11 (×16): qty 1

## 2011-08-11 MED ORDER — SENNOSIDES-DOCUSATE SODIUM 8.6-50 MG PO TABS
1.0000 | ORAL_TABLET | Freq: Every day | ORAL | Status: DC | PRN
  Filled 2011-08-11: qty 1

## 2011-08-11 MED ORDER — INSULIN GLARGINE 100 UNIT/ML ~~LOC~~ SOLN
10.0000 [IU] | Freq: Every day | SUBCUTANEOUS | Status: DC
  Administered 2011-08-11: 10 [IU] via SUBCUTANEOUS
  Filled 2011-08-11: qty 3

## 2011-08-11 MED ORDER — DEXTROSE 50 % IV SOLN: 25.0000 mL | INTRAVENOUS | Status: DC | PRN

## 2011-08-11 MED ORDER — METOCLOPRAMIDE HCL 10 MG PO TABS
10.0000 mg | ORAL_TABLET | Freq: Three times a day (TID) | ORAL | Status: DC
  Administered 2011-08-11 – 2011-08-19 (×23): 10 mg via ORAL
  Filled 2011-08-11 (×27): qty 1

## 2011-08-11 MED ORDER — ALUM & MAG HYDROXIDE-SIMETH 200-200-20 MG/5ML PO SUSP: 30.0000 mL | Freq: Four times a day (QID) | ORAL | Status: DC | PRN

## 2011-08-11 MED ORDER — CITALOPRAM HYDROBROMIDE 20 MG PO TABS
20.0000 mg | ORAL_TABLET | Freq: Every day | ORAL | Status: DC
  Administered 2011-08-11: 20 mg via ORAL
  Filled 2011-08-11 (×2): qty 1

## 2011-08-11 MED ORDER — DEXTROSE-NACL 5-0.45 % IV SOLN
INTRAVENOUS | Status: DC
  Administered 2011-08-11: 11:00:00 via INTRAVENOUS

## 2011-08-11 MED ORDER — ACETAMINOPHEN 650 MG RE SUPP: 650.0000 mg | Freq: Four times a day (QID) | RECTAL | Status: DC | PRN

## 2011-08-11 MED ORDER — INSULIN GLARGINE 100 UNIT/ML ~~LOC~~ SOLN
10.0000 [IU] | Freq: Every day | SUBCUTANEOUS | Status: DC
  Filled 2011-08-11: qty 3

## 2011-08-11 MED ORDER — SODIUM CHLORIDE 0.9 % IV SOLN
INTRAVENOUS | Status: DC
  Administered 2011-08-11: 1.6 [IU]/h via INTRAVENOUS

## 2011-08-11 MED ORDER — ENALAPRIL MALEATE 20 MG PO TABS
20.0000 mg | ORAL_TABLET | Freq: Every day | ORAL | Status: DC
Start: 2011-08-11 — End: 2011-08-11
  Filled 2011-08-11 (×2): qty 1

## 2011-08-11 MED ORDER — DEXTROSE-NACL 5-0.45 % IV SOLN
INTRAVENOUS | Status: DC
  Administered 2011-08-11 (×2): 125 mL/h via INTRAVENOUS
  Administered 2011-08-11 – 2011-08-12 (×2): via INTRAVENOUS

## 2011-08-11 MED ORDER — ENALAPRIL MALEATE 10 MG PO TABS
20.0000 mg | ORAL_TABLET | Freq: Every day | ORAL | Status: DC
  Administered 2011-08-11 – 2011-08-19 (×9): 20 mg via ORAL
  Filled 2011-08-11 (×10): qty 2

## 2011-08-11 MED ORDER — SODIUM CHLORIDE 0.9 % IV SOLN
INTRAVENOUS | Status: DC
  Administered 2011-08-11: 08:00:00 via INTRAVENOUS
  Filled 2011-08-11: qty 1

## 2011-08-11 MED ORDER — DULOXETINE HCL 60 MG PO CPEP
60.0000 mg | ORAL_CAPSULE | Freq: Every day | ORAL | Status: DC
  Administered 2011-08-11 – 2011-08-13 (×3): 60 mg via ORAL
  Filled 2011-08-11 (×3): qty 1

## 2011-08-11 MED ORDER — TRAZODONE HCL 100 MG PO TABS
100.0000 mg | ORAL_TABLET | Freq: Every day | ORAL | Status: DC
  Administered 2011-08-11 – 2011-08-18 (×8): 100 mg via ORAL
  Filled 2011-08-11 (×10): qty 1

## 2011-08-11 NOTE — H&P (Addendum)
[name]  [id number]  [birth year]  Referring physician: April Palumbo, M.D.  PCP: No primary provider on file.  Chief Complaint: High blood sugar   Note that this patient was transferred from behavioral health without being discharged therefore previous admission orders and history and physical were entered on the incorrect account number. The history and physical entered electronically by myself and timestamped 08/11/2011, 8:17 AM was indeed performed on that day and time. However, it erroneously appears as if it was related to an encounter from 08/09/2011.  HPI:  50 year old woman with a history of brittle diabetes mellitus. She has been asked [facility] for treatment of recurrent depression, generalized anxiety disorder with agoraphobia; she presented emergency department initially November 4 for hyperglycemia but was transferred back to behavioral health. However she presented again on November 5 emergency room and at that time was admitted. She was discharged November 16. Her 17 she was transferred back to [facility].  She was not seen in the emergency department by staff until approximately 6 AM November 18. The last time she received insulin of any kind with sliding scale insulin at 5:53 PM November 17. Her last dose of Lantus was November 16 at 11:47 PM.  Laboratory studies were last drawn 9 hours ago. Hospital service was consulted for consideration for admission for hyperglycemia. The emergency room physician has placed the patient on an IV insulin infusion. No documentation is yet available however I have discussed the case with the treating physician in the emergency department.  Past Medical History   Diagnosis  Date   .  Diabetes mellitus    .  Hypertension    .  Anemia    .  Chronic back pain    .  Neuropathy    .  Hypercholesterolemia    .  Major depressive disorder, recurrent    .  Generalized anxiety disorder    .  Panic disorder with  agoraphobia     Past Surgical History   Procedure  Date   .  Foot surgery    .  Back surgery    .  Hernia repair    .  Tonsillectomy and adenoidectomy    .  Shoulder surgery    .  Knee surgery     Social History: reports that she has been smoking Cigarettes. She has a 20 pack-year smoking history. She does not have any smokeless tobacco history on file. She reports that she does not drink alcohol or use illicit drugs.  Allergies:  Allergies   Allergen  Reactions   .  Celecoxib  Nausea Only   .  Ibuprofen  Nausea Only    Family History   Problem  Relation  Age of Onset   .  Heart disease  Mother    .  Lung cancer  Father     Prior to Admission medications   Medication  Sig  Start Date  End Date  Taking?  Authorizing Provider   ALPRAZolam Prudy Feeler) 0.5 MG tablet  Take 1 tablet (0.5 mg total) by mouth 2 (two) times daily as needed. For anxiety  08/04/11    Simbiso Ranga   citalopram (CELEXA) 20 MG tablet  Take 20 mg by mouth at bedtime.     Historical Provider, MD   DULoxetine (CYMBALTA) 60 MG capsule  Take 60 mg by mouth daily.     Historical Provider, MD   enalapril (VASOTEC) 20 MG tablet  Take 20 mg by mouth  daily.     Historical Provider, MD   HYDROcodone-acetaminophen (VICODIN) 5-500 MG per tablet  Take 1 tablet by mouth daily as needed. For pain  08/04/11    Simbiso Ranga   metoCLOPramide (REGLAN) 10 MG tablet  Take 10 mg by mouth 3 (three) times daily before meals.     Historical Provider, MD   traZODone (DESYREL) 100 MG tablet  Take 100 mg by mouth at bedtime.     Historical Provider, MD   Review of Systems:  Positive for blurred vision, chronic back pain. Negative for fever, sore throat, rash, chest pain, shortness of breath, dysuria, bleeding, nausea, vomiting, abdominal pain, diarrhea. [name] eating breakfast this morning.  Physical Exam:  General: Well-developed lying flat on a bed in the emergency department. No acute distress.  Eyes: Pupils equal, round and reactive to  light. Normal lids and irises.  ENT: Hearing grossly normal. Poor dentition and missing teeth.  Neck: No lymphadenopathy or masses; no thyromegaly.  Cardiovascular: Regular rate and rhythm. No murmur, rub or gallop. No lower extremity edema.  Respiratory: Clear to auscultation bilaterally. No wheezes, Rales or rhonchi.  Abdomen: Soft, nontender nondistended.  Skin: No rash or lesions seen.  Musculoskeletal: Chronic foot deformities bilaterally.  Psychiatric: Grossly normal mood and affect.  Neurologic: Grossly normal.  Labs on Admission:  Results for orders placed during the hospital encounter of 08/09/11 (from the past 24 hour(s))   GLUCOSE, CAPILLARY Status: Abnormal    Collection Time    08/10/11 11:51 AM   Component  Value  Range    Glucose-Capillary  443 (*)  70 - 99 (mg/dL)    Comment 1  Notify RN    GLUCOSE, CAPILLARY Status: Abnormal    Collection Time    08/10/11 5:15 PM   Component  Value  Range    Glucose-Capillary  497 (*)  70 - 99 (mg/dL)    Comment 1  Notify RN    GLUCOSE, CAPILLARY Status: Abnormal    Collection Time    08/10/11 9:10 PM   Component  Value  Range    Glucose-Capillary  546 (*)  70 - 99 (mg/dL)    Comment 1  Notify RN    GLUCOSE, CAPILLARY Status: Abnormal    Collection Time    08/10/11 10:11 PM   Component  Value  Range    Glucose-Capillary  566 (*)  70 - 99 (mg/dL)    Comment 1  Notify RN     Comment 2  Documented in Chart    CBC Status: Normal    Collection Time    08/10/11 11:00 PM   Component  Value  Range    WBC  9.8  4.0 - 10.5 (K/uL)    RBC  4.68  3.87 - 5.11 (MIL/uL)    Hemoglobin  14.3  12.0 - 15.0 (g/dL)    HCT  04.5  40.9 - 81.1 (%)    MCV  85.7  78.0 - 100.0 (fL)    MCH  30.6  26.0 - 34.0 (pg)    MCHC  35.7  30.0 - 36.0 (g/dL)    RDW  91.4  78.2 - 95.6 (%)    Platelets  268  150 - 400 (K/uL)   DIFFERENTIAL Status: Normal    Collection Time    08/10/11 11:00 PM   Component  Value  Range    Neutrophils Relative  61  43 - 77  (%)    Neutro Abs  5.9  1.7 - 7.7 (K/uL)    Lymphocytes Relative  30  12 - 46 (%)    Lymphs Abs  2.9  0.7 - 4.0 (K/uL)    Monocytes Relative  7  3 - 12 (%)    Monocytes Absolute  0.7  0.1 - 1.0 (K/uL)    Eosinophils Relative  3  0 - 5 (%)    Eosinophils Absolute  0.3  0.0 - 0.7 (K/uL)    Basophils Relative  1  0 - 1 (%)    Basophils Absolute  0.1  0.0 - 0.1 (K/uL)   BASIC METABOLIC PANEL Status: Abnormal    Collection Time    08/10/11 11:00 PM   Component  Value  Range    Sodium  128 (*)  135 - 145 (mEq/L)    Potassium  3.9  3.5 - 5.1 (mEq/L)    Chloride  92 (*)  96 - 112 (mEq/L)    CO2  24  19 - 32 (mEq/L)    Glucose, Bld  483 (*)  70 - 99 (mg/dL)    BUN  19  6 - 23 (mg/dL)    Creatinine, Ser  1.61  0.50 - 1.10 (mg/dL)    Calcium  9.6  8.4 - 10.5 (mg/dL)    GFR calc non Af Amer  >90  >90 (mL/min)    GFR calc Af Amer  >90  >90 (mL/min)   GLUCOSE, CAPILLARY Status: Abnormal    Collection Time    08/11/11 2:45 AM   Component  Value  Range    Glucose-Capillary  389 (*)  70 - 99 (mg/dL)    Comment 1  Notify RN     Comment 2  Documented in Chart    GLUCOSE, CAPILLARY Status: Abnormal    Collection Time    08/11/11 6:50 AM   Component  Value  Range    Glucose-Capillary  329 (*)  70 - 99 (mg/dL)    Assessment/Plan  Uncontrolled brittle diabetes mellitus: Her review of her previous hospitalizations is notable for brittle diabetes has been quite difficult control. There have been numerous episodes of hypo-and hyperglycemia. Her presentation is complicated by the fact that she has received no long acting insulin for more than 24 hours. Given the difficulty of controlling her blood sugars we will admit her to the hospital for stabilization of her blood sugar. I would anticipate transfer back to behavioral [facility] tomorrow.  Hypertension: Stable.  Fall with back pain: Stable. Prior to admission.  Maj. depressive disorder, recurrent: Continue current psychiatric medications.  Psychiatry consult.  Code Status: Full.  Disposition Plan: Return to behavioral health anticipated 1-2 days.

## 2011-08-11 NOTE — H&P (Signed)
[name] [id number] 07-25-1961  Referring physician: April Palumbo, M.D. PCP: No primary provider on file.   Chief Complaint: High blood sugar  HPI:  50 year old woman with a history of brittle diabetes mellitus. She has been asked [facility] for treatment of recurrent depression, generalized anxiety disorder with agoraphobia; she presented emergency department initially November 4 for hyperglycemia but was transferred back to behavioral health. However she presented again on November 5 emergency room and at that time was admitted. She was discharged November 16. Her 17 she was transferred back to [facility] for hyperglycemia.  She was not seen in the emergency department by staff until approximately 6 AM November 18. The last time she received insulin of any kind with sliding scale insulin at 5:53 PM November 17. Her last dose of Lantus was November 16 at 11:47 PM.  Laboratory studies were last drawn 9 hours ago. Hospital service was consulted for consideration for admission for hyperglycemia. The emergency room physician has placed the patient on an IV insulin infusion. No documentation is yet available however I have discussed the case with the treating physician in the emergency department.  Past Medical History  Diagnosis Date  . Diabetes mellitus   . Hypertension   . Anemia   . Chronic back pain   . Neuropathy   . Hypercholesterolemia   . Major depressive disorder, recurrent   . Generalized anxiety disorder   . Panic disorder with agoraphobia    Past Surgical History  Procedure Date  . Foot surgery   . Back surgery   . Hernia repair   . Tonsillectomy and adenoidectomy   . Shoulder surgery   . Knee surgery    Social History:  reports that she has been smoking Cigarettes.  She has a 20 pack-year smoking history. She does not have any smokeless tobacco history on file. She reports that she does not drink alcohol or use illicit drugs.  Allergies:    Allergies  Allergen Reactions  . Celecoxib Nausea Only  . Ibuprofen Nausea Only   Family History  Problem Relation Age of Onset  . Heart disease Mother   . Lung cancer Father    Prior to Admission medications   Medication Sig Start Date End Date Taking? Authorizing Provider  ALPRAZolam Prudy Feeler) 0.5 MG tablet Take 1 tablet (0.5 mg total) by mouth 2 (two) times daily as needed. For anxiety 08/04/11   Simbiso Ranga  citalopram (CELEXA) 20 MG tablet Take 20 mg by mouth at bedtime.     Historical Provider, MD  DULoxetine (CYMBALTA) 60 MG capsule Take 60 mg by mouth daily.     Historical Provider, MD  enalapril (VASOTEC) 20 MG tablet Take 20 mg by mouth daily.     Historical Provider, MD  HYDROcodone-acetaminophen (VICODIN) 5-500 MG per tablet Take 1 tablet by mouth daily as needed. For pain 08/04/11   Simbiso Ranga  metoCLOPramide (REGLAN) 10 MG tablet Take 10 mg by mouth 3 (three) times daily before meals.     Historical Provider, MD  traZODone (DESYREL) 100 MG tablet Take 100 mg by mouth at bedtime.     Historical Provider, MD   Review of Systems:  Positive for blurred vision, chronic back pain. Negative for fever, sore throat, rash, chest pain, shortness of breath, dysuria, bleeding, nausea, vomiting, abdominal pain, diarrhea. [name] eating breakfast this morning.  Physical Exam: General:  Well-developed lying flat on a bed in the emergency department. No acute distress. Eyes: Pupils equal, round  and reactive to light. Normal lids and irises. ENT: Hearing grossly normal. Poor dentition and missing teeth. Neck: No lymphadenopathy or masses; no thyromegaly. Cardiovascular: Regular rate and rhythm. No murmur, rub or gallop. No lower extremity edema. Respiratory: Clear to auscultation bilaterally. No wheezes, Rales or rhonchi. Abdomen: Soft, nontender nondistended. Skin: No rash or lesions seen. Musculoskeletal: Chronic foot deformities bilaterally. Psychiatric: Grossly normal mood and  affect. Neurologic: Grossly normal.  Labs on Admission:  Results for orders placed during the hospital encounter of 08/09/11 (from the past 24 hour(s))  GLUCOSE, CAPILLARY     Status: Abnormal   Collection Time   08/10/11 11:51 AM      Component Value Range   Glucose-Capillary 443 (*) 70 - 99 (mg/dL)   Comment 1 Notify RN    GLUCOSE, CAPILLARY     Status: Abnormal   Collection Time   08/10/11  5:15 PM      Component Value Range   Glucose-Capillary 497 (*) 70 - 99 (mg/dL)   Comment 1 Notify RN    GLUCOSE, CAPILLARY     Status: Abnormal   Collection Time   08/10/11  9:10 PM      Component Value Range   Glucose-Capillary 546 (*) 70 - 99 (mg/dL)   Comment 1 Notify RN    GLUCOSE, CAPILLARY     Status: Abnormal   Collection Time   08/10/11 10:11 PM      Component Value Range   Glucose-Capillary 566 (*) 70 - 99 (mg/dL)   Comment 1 Notify RN     Comment 2 Documented in Chart    CBC     Status: Normal   Collection Time   08/10/11 11:00 PM      Component Value Range   WBC 9.8  4.0 - 10.5 (K/uL)   RBC 4.68  3.87 - 5.11 (MIL/uL)   Hemoglobin 14.3  12.0 - 15.0 (g/dL)   HCT 16.1  09.6 - 04.5 (%)   MCV 85.7  78.0 - 100.0 (fL)   MCH 30.6  26.0 - 34.0 (pg)   MCHC 35.7  30.0 - 36.0 (g/dL)   RDW 40.9  81.1 - 91.4 (%)   Platelets 268  150 - 400 (K/uL)  DIFFERENTIAL     Status: Normal   Collection Time   08/10/11 11:00 PM      Component Value Range   Neutrophils Relative 61  43 - 77 (%)   Neutro Abs 5.9  1.7 - 7.7 (K/uL)   Lymphocytes Relative 30  12 - 46 (%)   Lymphs Abs 2.9  0.7 - 4.0 (K/uL)   Monocytes Relative 7  3 - 12 (%)   Monocytes Absolute 0.7  0.1 - 1.0 (K/uL)   Eosinophils Relative 3  0 - 5 (%)   Eosinophils Absolute 0.3  0.0 - 0.7 (K/uL)   Basophils Relative 1  0 - 1 (%)   Basophils Absolute 0.1  0.0 - 0.1 (K/uL)  BASIC METABOLIC PANEL     Status: Abnormal   Collection Time   08/10/11 11:00 PM      Component Value Range   Sodium 128 (*) 135 - 145 (mEq/L)    Potassium 3.9  3.5 - 5.1 (mEq/L)   Chloride 92 (*) 96 - 112 (mEq/L)   CO2 24  19 - 32 (mEq/L)   Glucose, Bld 483 (*) 70 - 99 (mg/dL)   BUN 19  6 - 23 (mg/dL)   Creatinine, Ser 7.82  0.50 - 1.10 (mg/dL)  Calcium 9.6  8.4 - 10.5 (mg/dL)   GFR calc non Af Amer >90  >90 (mL/min)   GFR calc Af Amer >90  >90 (mL/min)  GLUCOSE, CAPILLARY     Status: Abnormal   Collection Time   08/11/11  2:45 AM      Component Value Range   Glucose-Capillary 389 (*) 70 - 99 (mg/dL)   Comment 1 Notify RN     Comment 2 Documented in Chart    GLUCOSE, CAPILLARY     Status: Abnormal   Collection Time   08/11/11  6:50 AM      Component Value Range   Glucose-Capillary 329 (*) 70 - 99 (mg/dL)   Assessment/Plan Uncontrolled brittle diabetes mellitus: Her review of her previous hospitalizations is notable for brittle diabetes has been quite difficult control. There have been numerous episodes of hypo-and hyperglycemia. Her presentation is complicated by the fact that she has received no long acting insulin for more than 24 hours. Given the difficulty of controlling her blood sugars we will admit her to the hospital for stabilization of her blood sugar. I would anticipate transfer back to behavioral [facility] tomorrow. Hypertension: Stable. Fall with back pain: Stable. Prior to admission. Maj. depressive disorder, recurrent: Continue current psychiatric medications. Psychiatry consult.  Code Status: Full.  Disposition Plan: Return to behavioral health anticipated 1-2 days.      Pandora Leiter MD 08/11/2011, 8:17 AM

## 2011-08-11 NOTE — ED Notes (Signed)
Insulin gtt increased to 5.7 cc/hr for CBG 347

## 2011-08-11 NOTE — ED Provider Notes (Signed)
History     CSN: [id number] Arrival date & time: 08/09/2011  9:35 PM   First MD Initiated Contact with Patient 08/11/11 0544      Chief Complaint  Patient presents with  . Hyperglycemia    (Consider location/radiation/quality/duration/timing/severity/associated sxs/prior treatment) The history is provided by the patient. No language interpreter was used.  Patient presents from [facility] with complaints of not having received her lantus and sliding scale for more than 24 hours and now her sugars are high.  Denies f/c/r.  No cp, sob,doe, no n/v/d.  No rashes on the skin.    Past Medical History  Diagnosis Date  . Diabetes mellitus   . Hypertension   . Anemia   . Chronic back pain   . Depression   . Neuropathy   . Hypercholesterolemia     History reviewed. No pertinent past surgical history.  History reviewed. No pertinent family history.  History  Substance Use Topics  . Smoking status: Current Everyday Smoker -- 0.5 packs/day for 20 years    Types: Cigarettes  . Smokeless tobacco: Not on file  . Alcohol Use: No    OB History    Grav Para Term Preterm Abortions TAB SAB Ect Mult Living                  Review of Systems  Constitutional: Negative for activity change.  HENT: Negative for facial swelling.   Eyes: Negative for discharge.  Respiratory: Negative for apnea.   Cardiovascular: Negative for chest pain.  Gastrointestinal: Negative for abdominal distention.  Genitourinary: Negative for difficulty urinating.  Musculoskeletal: Negative for arthralgias.  Skin: Negative for color change.  Neurological: Negative for dizziness.  Hematological: Negative.   Psychiatric/Behavioral: Negative.     Allergies  Celecoxib and Ibuprofen  Home Medications   Current Outpatient Rx  Name Route Sig Dispense Refill  . ALPRAZOLAM 0.5 MG PO TABS Oral Take 1 tablet (0.5 mg total) by mouth 2 (two) times daily as needed. For anxiety 20 tablet 0  . CITALOPRAM HYDROBROMIDE 20 MG  PO TABS Oral Take 20 mg by mouth at bedtime.     . DULOXETINE HCL 60 MG PO CPEP Oral Take 60 mg by mouth daily.     . ENALAPRIL MALEATE 20 MG PO TABS Oral Take 20 mg by mouth daily.     Marland Kitchen HYDROCODONE-ACETAMINOPHEN 5-500 MG PO TABS Oral Take 1 tablet by mouth daily as needed. For pain 15 tablet 0  . METOCLOPRAMIDE HCL 10 MG PO TABS Oral Take 10 mg by mouth 3 (three) times daily before meals.     . TRAZODONE HCL 100 MG PO TABS Oral Take 100 mg by mouth at bedtime.       BP 139/69  Pulse 68  Temp(Src) 98.1 F (36.7 C) (Oral)  Resp 18  SpO2 96%  Physical Exam  Constitutional: [name] oriented to person, place, and time. She appears well-developed and well-nourished. No distress.  HENT:  Head: Normocephalic and atraumatic.  Eyes: EOM are normal. Pupils are equal, round, and reactive to light.  Neck: Normal range of motion. Neck supple.  Cardiovascular: Normal rate and regular rhythm.   Pulmonary/Chest: Effort normal and breath sounds normal. She has no wheezes.  Abdominal: Soft. Bowel sounds are normal. There is no tenderness. There is no rebound and no guarding.  Musculoskeletal: Normal range of motion.  Neurological: [name] alert and oriented to person, place, and time.  Skin: Skin is warm and dry.  Psychiatric: Thought  content normal.    ED Course  Procedures (including critical care time)  Labs Reviewed  GLUCOSE, CAPILLARY - Abnormal; Notable for the following:    Glucose-Capillary 274 (*)    All other components within normal limits  GLUCOSE, CAPILLARY - Abnormal; Notable for the following:    Glucose-Capillary 223 (*)    All other components within normal limits  GLUCOSE, CAPILLARY - Abnormal; Notable for the following:    Glucose-Capillary 443 (*)    All other components within normal limits  GLUCOSE, CAPILLARY - Abnormal; Notable for the following:    Glucose-Capillary 497 (*)    All other components within normal limits  GLUCOSE, CAPILLARY - Abnormal; Notable for the  following:    Glucose-Capillary 546 (*)    All other components within normal limits  GLUCOSE, CAPILLARY - Abnormal; Notable for the following:    Glucose-Capillary 566 (*)    All other components within normal limits  BASIC METABOLIC PANEL - Abnormal; Notable for the following:    Sodium 128 (*)    Chloride 92 (*)    Glucose, Bld 483 (*)    All other components within normal limits  GLUCOSE, CAPILLARY - Abnormal; Notable for the following:    Glucose-Capillary 389 (*)    All other components within normal limits  GLUCOSE, CAPILLARY - Abnormal; Notable for the following:    Glucose-Capillary 329 (*)    All other components within normal limits  CBC  DIFFERENTIAL  POCT CBG MONITORING  POCT CBG MONITORING  POCT CBG MONITORING  POCT CBG MONITORING  URINALYSIS, ROUTINE W REFLEX MICROSCOPIC  URINE CULTURE  BLOOD GAS, VENOUS   No results found.   1. Tobacco abuse   2. Diabetes mellitus       MDM  Glucose stabilizer initiated plan to admit.          Jasmine Awe, MD 08/11/11 [phone]

## 2011-08-11 NOTE — ED Notes (Signed)
Insulin gtt decreased to 3.3

## 2011-08-11 NOTE — ED Notes (Signed)
Insulin gtt increased to 7.7 for CBG 315.

## 2011-08-11 NOTE — ED Notes (Signed)
RN opened chart due to confusion regarding discharge/ admit to floor, Epic working with registration to resolve the problem.

## 2011-08-11 NOTE — Progress Notes (Signed)
Has Cm received consult for pt medication assistance. Pt ineligible for indigent funds. Pt has medicaid. Can benefit from [facility] copay waiver assistance program. MD notified of medication assiastance ineligibility.

## 2011-08-11 NOTE — ED Notes (Signed)
Pt given prn pain medication for back pain 9/10, ambulated to restroom w/o difficulty.

## 2011-08-11 NOTE — ED Notes (Signed)
Report called to nurse receiving pt on 4E, blood work to be drawn and CBG to be checked before pt arrives to 1419.

## 2011-08-11 NOTE — Progress Notes (Signed)
Cm spoke with pt. Pt states pharmacy is incorrect concerning ineligibility of indigent funds. Pt given application for [employer] medicaid copay waiver assistance program. Pt request RW. Per pt choice [facility] to provide DME. [facility] notified of referral.

## 2011-08-12 LAB — GLUCOSE, CAPILLARY
Glucose-Capillary: 352 mg/dL — ABNORMAL HIGH (ref 70–99)
Glucose-Capillary: 387 mg/dL — ABNORMAL HIGH (ref 70–99)
Glucose-Capillary: 428 mg/dL — ABNORMAL HIGH (ref 70–99)

## 2011-08-12 LAB — CBC
HCT: 34.2 % — ABNORMAL LOW (ref 36.0–46.0)
Hemoglobin: 11.7 g/dL — ABNORMAL LOW (ref 12.0–15.0)
MCH: 29.5 pg (ref 26.0–34.0)
MCV: 86.4 fL (ref 78.0–100.0)
RBC: 3.96 MIL/uL (ref 3.87–5.11)

## 2011-08-12 LAB — BASIC METABOLIC PANEL
CO2: 27 mEq/L (ref 19–32)
Calcium: 9 mg/dL (ref 8.4–10.5)
Chloride: 105 mEq/L (ref 96–112)
Glucose, Bld: 153 mg/dL — ABNORMAL HIGH (ref 70–99)
Potassium: 3.3 mEq/L — ABNORMAL LOW (ref 3.5–5.1)
Sodium: 138 mEq/L (ref 135–145)

## 2011-08-12 LAB — URINE CULTURE

## 2011-08-12 MED ORDER — BUPROPION HCL 75 MG PO TABS
75.0000 mg | ORAL_TABLET | Freq: Two times a day (BID) | ORAL | Status: DC
  Administered 2011-08-12 – 2011-08-14 (×5): 75 mg via ORAL
  Filled 2011-08-12 (×6): qty 1

## 2011-08-12 MED ORDER — INSULIN GLARGINE 100 UNIT/ML ~~LOC~~ SOLN
10.0000 [IU] | Freq: Every day | SUBCUTANEOUS | Status: DC
  Filled 2011-08-12: qty 3

## 2011-08-12 MED ORDER — INSULIN ASPART 100 UNIT/ML ~~LOC~~ SOLN
0.0000 [IU] | SUBCUTANEOUS | Status: DC
  Administered 2011-08-12 (×2): 3 [IU] via SUBCUTANEOUS
  Administered 2011-08-12: 2 [IU] via SUBCUTANEOUS
  Filled 2011-08-12: qty 3

## 2011-08-12 MED ORDER — INSULIN GLARGINE 100 UNIT/ML ~~LOC~~ SOLN
15.0000 [IU] | Freq: Every day | SUBCUTANEOUS | Status: DC
  Administered 2011-08-12: 15 [IU] via SUBCUTANEOUS

## 2011-08-12 MED ORDER — INSULIN ASPART 100 UNIT/ML ~~LOC~~ SOLN
3.0000 [IU] | Freq: Three times a day (TID) | SUBCUTANEOUS | Status: DC
  Administered 2011-08-12: 3 [IU] via SUBCUTANEOUS

## 2011-08-12 MED ORDER — INSULIN ASPART 100 UNIT/ML ~~LOC~~ SOLN
0.0000 [IU] | Freq: Three times a day (TID) | SUBCUTANEOUS | Status: DC
Start: 2011-08-12 — End: 2011-08-19
  Administered 2011-08-12: 9 [IU] via SUBCUTANEOUS
  Administered 2011-08-12 – 2011-08-13 (×2): 7 [IU] via SUBCUTANEOUS
  Administered 2011-08-13: 2 [IU] via SUBCUTANEOUS
  Administered 2011-08-13: 5 [IU] via SUBCUTANEOUS
  Administered 2011-08-14: 2 [IU] via SUBCUTANEOUS
  Administered 2011-08-14: 5 [IU] via SUBCUTANEOUS
  Administered 2011-08-15: 1 [IU] via SUBCUTANEOUS
  Administered 2011-08-15: 3 [IU] via SUBCUTANEOUS
  Administered 2011-08-16: 7 [IU] via SUBCUTANEOUS
  Administered 2011-08-16: 3 [IU] via SUBCUTANEOUS
  Administered 2011-08-17: 1 [IU] via SUBCUTANEOUS
  Administered 2011-08-17: 7 [IU] via SUBCUTANEOUS
  Administered 2011-08-17: 3 [IU] via SUBCUTANEOUS
  Administered 2011-08-18: 9 [IU] via SUBCUTANEOUS
  Administered 2011-08-18: 3 [IU] via SUBCUTANEOUS
  Administered 2011-08-19: 2 [IU] via SUBCUTANEOUS
  Filled 2011-08-12: qty 3

## 2011-08-12 NOTE — Progress Notes (Signed)
PROGRESS NOTE  [name] WUJ:[id number] DOB: [birth year] DOA: 08/11/2011 PCP: No primary provider on file.  Brief narrative: 50 year old woman with brittle diabetes mellitus with several hospitalizations for the same. [facility] for [facility].  Past medical history: Diabetes, hypertension, neuropathy, major depressive disorder.  Consultants:  Psychiatry  Procedures:  None  Interim History: No issues overnight per RN. Transitioned off infusion of insulin. No hypo-or hyperglycemia significance on Lantus last night.  Subjective: Poor sleep last night but otherwise feels well.   Objective: Filed Vitals:   08/12/11 0430  BP: 105/68  Pulse: 60  Temp: 97.9 F (36.6 C)  Resp: 18   Blood pressure 105/68, pulse 60, temperature 97.9 F (36.6 C), temperature source Oral, resp. rate 18, height 5\' 4"  (1.626 m), weight 74.2 kg (163 lb 9.3 oz), SpO2 96.00%.   Intake/Output Summary (Last 24 hours) at 08/12/11 1140 Last data filed at 08/12/11 0900  Gross per 24 hour  Intake 2190.43 ml  Output   2050 ml  Net 140.43 ml    Exam: General: Well-appearing. Cardiovascular: Regular rate and rhythm. No murmur, rub or gallop. Respiratory: Clear to patient bilaterally. No wheezes, rales or rhonchi. Normal respiratory effort.  Basic Metabolic Panel:  Lab 08/12/11 9562 08/11/11 1050 08/10/11 2300  NA 138 135 128*  K 3.3* 3.7 --  CL 105 101 92*  CO2 27 24 24   GLUCOSE 153* 201* 483*  BUN 11 14 19   CREATININE 0.57 0.58 0.68  CALCIUM 9.0 9.0 9.6  MG -- -- --  PHOS -- -- --   CBC:  Lab 08/12/11 0340 08/11/11 1050 08/10/11 2300  WBC 7.3 6.4 9.8  NEUTROABS -- -- 5.9  HGB 11.7* 12.3 14.3  HCT 34.2* 34.9* 40.1  MCV 86.4 84.5 85.7  PLT 233 221 268   CBG:  Lab 08/12/11 0758 08/12/11 0407 08/12/11 0032 08/11/11 2306 08/11/11 2201  GLUCAP 161* 142* 170* 177* 160*   Scheduled Meds:   . citalopram  20 mg Oral QHS  . DULoxetine  60 mg Oral Daily    . enalapril  20 mg Oral Daily  . insulin aspart  0-15 Units Subcutaneous Q4H  . metoCLOPramide  10 mg Oral TID AC  . traZODone  100 mg Oral QHS  . DISCONTD: enalapril  20 mg Oral Daily  . DISCONTD: insulin glargine  10 Units Subcutaneous QHS  . DISCONTD: insulin glargine  10 Units Subcutaneous QHS   Continuous Infusions:   . sodium chloride    . dextrose 5 % and 0.45% NaCl 125 mL/hr at 08/12/11 0806  . DISCONTD: insulin (NOVOLIN-R) infusion Stopped (08/12/11 0140)     Assessment/Plan: 1. Brittle diabetes mellitus 2. Hypertension:, Stable 3. Maj. depressive disorder, recurrent: Continue psychiatric medications. Psychiatry consult.  Patient is medically stable for return to behavioral [facility].  Code Status: Full    LOS: 1 day   Brendia Sacks, MD  [facility] Pager [phone] 08/12/2011, 11:40 AM

## 2011-08-12 NOTE — Progress Notes (Signed)
Spoke with [name], care manager for [name].  [name] asked me if pt could be d/c'd on insulin pens.  Reviewed home insulins with care mgr.  Per last H&P note from 07/30/11, pt was supposed to be taking Lantus 40 units QHS plus Humalog 5 units tid with meals.  Not sure if patient was using insulin pens or vial and syringe.  [name], care mgr to determine this information.  If MD desires to send pt home on Lantus and Novolog pens that we have been using with pt during this admission, pt will need prescriptions for these pens.  Pt cannot take home hospital pens without Rxs for these pens.  Pt will also need a separate Rx for insulin pen needles.  If this decision is made, please have RN show pt how to use insulin pens before d/c with the Insulin pen starter kit that can be ordered from pharmacy.

## 2011-08-12 NOTE — Progress Notes (Signed)
Patient uses Pen @ home for Lantus,& uses the syringe for Humalog.  Noted DM Coordin recommendations for scripts @ d/c.

## 2011-08-12 NOTE — Consult Note (Signed)
Patient Identification:  [name] Date of Evaluation:  08/12/2011   History of Present Illness 50 year old Caucasian female with history of  diabetes mellitus. Patient has a history of depression and generalized anxiety disorder. Patient was admitted to behavioral health but because of her high sugar she was sent to the medical floor. The patient is feeling sick but she denies feeling suicidal. She told me that she has a history of a number of suicide attempts but that was 20 years ago when she was using drugs and was not having any stable relationship. But now she is living with her current husband for the last 20 years and denied any suicide attempt during this time period. Patient is logical and goal-directed during the interview no psychotic or manic symptoms present. Her insight and judgment is fair attention concentration is fair abstraction ability is fair.   Past Medical History:     Past Medical History  Diagnosis Date  . Diabetes mellitus   . Hypertension   . Anemia   . Chronic back pain   . Neuropathy   . Hypercholesterolemia   . Major depressive disorder, recurrent   . Generalized anxiety disorder   . Panic disorder with agoraphobia        Past Surgical History  Procedure Date  . Foot surgery   . Back surgery   . Hernia repair   . Tonsillectomy and adenoidectomy   . Shoulder surgery   . Knee surgery     Allergies:  Allergies  Allergen Reactions  . Celecoxib Nausea Only  . Ibuprofen Nausea Only    Current Medications:  Prior to Admission medications   Medication Sig Start Date End Date Taking? Authorizing Provider  ALPRAZolam Prudy Feeler) 0.5 MG tablet Take 1 tablet (0.5 mg total) by mouth 2 (two) times daily as needed. For anxiety 08/04/11  Yes Simbiso Ranga  citalopram (CELEXA) 20 MG tablet Take 20 mg by mouth at bedtime.    Yes Historical Provider, MD  DULoxetine (CYMBALTA) 60 MG capsule Take 60 mg by mouth daily.    Yes Historical Provider, MD  enalapril  (VASOTEC) 20 MG tablet Take 20 mg by mouth daily.    Yes Historical Provider, MD  HYDROcodone-acetaminophen (VICODIN) 5-500 MG per tablet Take 1 tablet by mouth daily as needed. For pain 08/04/11  Yes Simbiso Ranga  metoCLOPramide (REGLAN) 10 MG tablet Take 10 mg by mouth 3 (three) times daily before meals.    Yes Historical Provider, MD  traZODone (DESYREL) 100 MG tablet Take 100 mg by mouth at bedtime.    Yes Historical Provider, MD    Social History:    reports that she has been smoking Cigarettes.  She has a 20 pack-year smoking history. She does not have any smokeless tobacco history on file. She reports that she does not drink alcohol or use illicit drugs.   Family History:    Family History  Problem Relation Age of Onset  . Heart disease Mother   . 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 Generalized anxiety disorder.   AXIS II  Deffered  AXIS III See medical notes.  AXIS IV  chronic medical issues   AXIS V 50     Recommendations: 1# I will discontinue Celexa and start patient on Wellbutrin 75 mg twice a day to prevent serotonin syndrome from the combination of Celexa and Cymbalta. #2 Cymbalta can be continued at  this time. #3 CSW should call the patient's husband regarding safety concerns. #4 I will follow up on this patient as needed.    Eulogio Ditch, MD

## 2011-08-12 NOTE — Progress Notes (Signed)
Inpatient Diabetes Program Recommendations  AACE/ADA: New Consensus Statement on Inpatient Glycemic Control (2009)  Target Ranges:  Prepandial:   less than 140 mg/dL      Peak postprandial:   less than 180 mg/dL (1-2 hours)      Critically ill patients:  140 - 180 mg/dL   Reason for Visit:  Pt now off IV insulin drip.  Currently only getting Novolog Moderate SSI q4 hours.  Inpatient Diabetes Program Recommendations Insulin - Basal: Please add standing Lantus dose- Pt received 10 units Lantus last pm at 2300.  Per records, pt takes Lantus 40 units QHS at home. Correction (SSI): Change SSI to tid ac + HS.  Pt on PO diet.  PO intake- 100% of meals.  Note:

## 2011-08-12 NOTE — Progress Notes (Deleted)
See same day progress note

## 2011-08-12 NOTE — Discharge Summary (Deleted)
Error

## 2011-08-12 NOTE — Progress Notes (Signed)
CBG value 428. MD notified. Instructed to go ahead and give the 9 units of insulin as ordered. Will continue to monitor.

## 2011-08-13 LAB — GLUCOSE, CAPILLARY
Glucose-Capillary: 347 mg/dL — ABNORMAL HIGH (ref 70–99)
Glucose-Capillary: 283 mg/dL — ABNORMAL HIGH (ref 70–99)
Glucose-Capillary: 171 mg/dL — ABNORMAL HIGH (ref 70–99)

## 2011-08-13 MED ORDER — FLUOXETINE HCL 20 MG PO CAPS
40.0000 mg | ORAL_CAPSULE | Freq: Every day | ORAL | Status: DC
  Administered 2011-08-14 – 2011-08-19 (×6): 40 mg via ORAL
  Filled 2011-08-13 (×7): qty 2

## 2011-08-13 MED ORDER — INSULIN GLARGINE 100 UNIT/ML ~~LOC~~ SOLN
20.0000 [IU] | Freq: Every day | SUBCUTANEOUS | Status: DC
  Administered 2011-08-13 – 2011-08-18 (×6): 20 [IU] via SUBCUTANEOUS

## 2011-08-13 MED ORDER — INSULIN ASPART 100 UNIT/ML ~~LOC~~ SOLN
6.0000 [IU] | Freq: Three times a day (TID) | SUBCUTANEOUS | Status: DC
  Administered 2011-08-13 – 2011-08-19 (×18): 6 [IU] via SUBCUTANEOUS

## 2011-08-13 NOTE — Progress Notes (Signed)
  [name] is a 50 y.o. female [id number] 1961/03/17  Subjective/Objective:  Patient is today feeling ready depressed and was crying during the interview. But she denied having suicidal ideations. I discussed with her about followup in the outpatient setting but then she told me that she can't even afford $4 co-pay for her medications both for psych and medical. I discussed that with Dr. Irene Limbo about providing some assistance in the outpatient setting he will speak to the clinical social worker and case manager regarding it. Regarding her psych medications as she is unable to afford the co-pays instead of Cymbalta I will start her on Prozac 40 mg by mouth daily and discontinue the Cymbalta. I will follow up on this patient as needed.  Filed Vitals:   08/13/11 1310  BP: 107/67  Pulse: 73  Temp: 98.9 F (37.2 C)  Resp: 16    Lab Results:   BMET    Component Value Date/Time   NA 138 08/12/2011 0340   K 3.3* 08/12/2011 0340   CL 105 08/12/2011 0340   CO2 27 08/12/2011 0340   GLUCOSE 153* 08/12/2011 0340   BUN 11 08/12/2011 0340   CREATININE 0.57 08/12/2011 0340   CALCIUM 9.0 08/12/2011 0340   GFRNONAA >90 08/12/2011 0340   GFRAA >90 08/12/2011 0340    Medications:  Scheduled:     . buPROPion  75 mg Oral BID  . DULoxetine  60 mg Oral Daily  . enalapril  20 mg Oral Daily  . insulin aspart  0-9 Units Subcutaneous TID WC  . insulin aspart  6 Units Subcutaneous TID WC  . insulin glargine  20 Units Subcutaneous QHS  . metoCLOPramide  10 mg Oral TID AC  . traZODone  100 mg Oral QHS  . DISCONTD: insulin aspart  3 Units Subcutaneous TID WC  . DISCONTD: insulin glargine  10 Units Subcutaneous QHS  . DISCONTD: insulin glargine  15 Units Subcutaneous QHS     PRN Meds acetaminophen, acetaminophen, ALPRAZolam, alum & mag hydroxide-simeth, dextrose, HYDROcodone-acetaminophen, ondansetron (ZOFRAN) IV, ondansetron, senna-docusate   Kailash Hinze S MD 08/13/2011

## 2011-08-13 NOTE — Progress Notes (Signed)
PROGRESS NOTE  [name] AVW:[id number] DOB: 04-May-1961 DOA: 08/11/2011 PCP: No primary provider on file.  Brief narrative: 50 year old woman with brittle diabetes mellitus with several hospitalizations for the same. [facility]. Was admitted and restarted on Lantus and sliding scale insulin.  Past medical history: Diabetes, hypertension, neuropathy, major depressive disorder.  Consultants:  Psychiatry  Inpatient diabetes nurse coordinator  Procedures:  None  Interim History: No issues overnight per RN. Hyperglycemic yesterday. Subjective: Overall feels well.  Objective: Filed Vitals:   08/13/11 0518  BP: 131/84  Pulse: 64  Temp: 98.3 F (36.8 C)  Resp: 18   Blood pressure 131/84, pulse 64, temperature 98.3 F (36.8 C), temperature source Oral, resp. rate 18, height 5\' 4"  (1.626 m), weight 74.2 kg (163 lb 9.3 oz), SpO2 93.00%.   Intake/Output Summary (Last 24 hours) at 08/13/11 1226 Last data filed at 08/13/11 0429  Gross per 24 hour  Intake    690 ml  Output   3375 ml  Net  -2685 ml    Exam: General: Well-appearing. Cardiovascular: Regular rate and rhythm. No murmur, rub or gallop. Respiratory: Clear to patient bilaterally. No wheezes, rales or rhonchi. Normal respiratory effort.  CBG:  Lab 08/13/11 1159 08/13/11 0810 08/13/11 0354 08/12/11 2345 08/12/11 2052  GLUCAP 283* 336* 347* 387* 352*   Scheduled Meds:    . buPROPion  75 mg Oral BID  . DULoxetine  60 mg Oral Daily  . enalapril  20 mg Oral Daily  . insulin aspart  0-9 Units Subcutaneous TID WC  . insulin aspart  6 Units Subcutaneous TID WC  . insulin glargine  20 Units Subcutaneous QHS  . metoCLOPramide  10 mg Oral TID AC  . traZODone  100 mg Oral QHS  . DISCONTD: citalopram  20 mg Oral QHS  . DISCONTD: insulin aspart  3 Units Subcutaneous TID WC  . DISCONTD: insulin glargine  10 Units Subcutaneous QHS  . DISCONTD: insulin glargine  15  Units Subcutaneous QHS   Continuous Infusions:    Assessment/Plan: 1. Brittle diabetes mellitus: Poor control. Remains hyperglycemic, no lows. Will increase Lantus. Increase meal coverage. 2. Hypertension:, Stable 3. Maj. depressive disorder, recurrent: Continue psychiatric medications as per psychiatry. I left a message for Dr. Rogers Blocker yesterday and paged him twice to discuss the case. Await response. [facility] has reservations about accepting the patient again because of hyperglycemia. I would recommend that at time of transfer to [facility] medical consultation be obtained.  Patient will likely be stable for return to behavioral [facility] November 21.  Code Status: Full    LOS: 2 days   Brendia Sacks, MD  [facility] Pager [phone] 08/13/2011, 12:26 PM

## 2011-08-13 NOTE — Progress Notes (Signed)
Inpatient Diabetes Program Recommendations  AACE/ADA: New Consensus Statement on Inpatient Glycemic Control (2009)  Target Ranges:  Prepandial:   less than 140 mg/dL      Peak postprandial:   less than 180 mg/dL (1-2 hours)      Critically ill patients:  140 - 180 mg/dL   Reason for Visit:  CBGs today: 387/ 347/ 336/ 283 mg/dl Noted 6 units Novolog meal coverage added today- Agree  Inpatient Diabetes Program Recommendations Insulin - Basal: Please titrate Lantus.  Pt's home dose is supposed to be 40 units QHS. Correction (SSI): Currently on Novolog Sensitive SSI.  Note:

## 2011-08-14 LAB — GLUCOSE, CAPILLARY
Glucose-Capillary: 273 mg/dL — ABNORMAL HIGH (ref 70–99)
Glucose-Capillary: 193 mg/dL — ABNORMAL HIGH (ref 70–99)

## 2011-08-14 MED ORDER — ARIPIPRAZOLE 5 MG PO TABS
5.0000 mg | ORAL_TABLET | Freq: Every day | ORAL | Status: DC
  Administered 2011-08-14 – 2011-08-18 (×5): 5 mg via ORAL
  Filled 2011-08-14 (×8): qty 1

## 2011-08-14 MED ORDER — BUPROPION HCL 75 MG PO TABS
150.0000 mg | ORAL_TABLET | Freq: Two times a day (BID) | ORAL | Status: DC
  Administered 2011-08-14 – 2011-08-19 (×10): 150 mg via ORAL
  Filled 2011-08-14 (×13): qty 2

## 2011-08-14 NOTE — Progress Notes (Signed)
PROGRESS NOTE  [name] ZOX:[id number] DOB: [birth year] DOA: 08/11/2011 PCP: No primary provider on file.  Brief narrative: 50 year old woman with brittle diabetes mellitus with several hospitalizations for the same. [facility]. Was admitted and restarted on Lantus and sliding scale insulin.  Past medical history: Diabetes, hypertension, neuropathy, major depressive disorder.  Consultants:  Psychiatry  Inpatient diabetes nurse coordinator  Procedures:  None  Interim History: No issues overnight per RN. Hyperglycemic yesterday. Subjective: Overall feels well.  Objective: Filed Vitals:   08/14/11 1431  BP: 139/84  Pulse: 67  Temp: 98.7 F (37.1 C)  Resp: 16   Blood pressure 139/84, pulse 67, temperature 98.7 F (37.1 C), temperature source Oral, resp. rate 16, height 5\' 4"  (1.626 m), weight 74.2 kg (163 lb 9.3 oz), SpO2 94.00%.   Intake/Output Summary (Last 24 hours) at 08/14/11 1647 Last data filed at 08/14/11 1300  Gross per 24 hour  Intake   2080 ml  Output   3401 ml  Net  -1321 ml    Exam: General: Well-appearing. Cardiovascular: Regular rate and rhythm. No murmur, rub or gallop. Respiratory: Clear to patient bilaterally. No wheezes, rales or rhonchi. Normal respiratory effort.  CBG:  Lab 08/14/11 1241 08/14/11 0814 08/13/11 2129 08/13/11 1624 08/13/11 1159  GLUCAP 273* 75 223* 171* 283*   Scheduled Meds:    . ARIPiprazole  5 mg Oral QHS  . buPROPion  150 mg Oral BID  . enalapril  20 mg Oral Daily  . FLUoxetine  40 mg Oral Daily  . insulin aspart  0-9 Units Subcutaneous TID WC  . insulin aspart  6 Units Subcutaneous TID WC  . insulin glargine  20 Units Subcutaneous QHS  . metoCLOPramide  10 mg Oral TID AC  . traZODone  100 mg Oral QHS  . DISCONTD: buPROPion  75 mg Oral BID   Continuous Infusions:    Assessment/Plan: 1. Brittle diabetes mellitus:better today, will not change her lantus and  meal coverage today 2. Hypertension:, Stable Maj. depressive disorder, recurrent: Continue psychiatric medications as per psychiatry, Can she go back to [facility] once sugars better.  Code Status: Full    LOS: 3 days   Zannie Cove, MD Pager: [phone] 08/14/2011, 4:47 PM

## 2011-08-14 NOTE — Progress Notes (Signed)
Follow up with pt after readmission to medical.  Pt mood fluctuated many times from tearful to calm.  Pt spoke with chaplain about feeling lonely and not wanting to be away from her family for the holidays.  Also spoke about anxiety surrounding family relationships, financial and medical situation.   Chaplain provided support and worked with pt on coping and goal setting, directing the pt to resources.  Pt did not seem able to focus on goals for care or family relationships.  Chaplain will call behavioral health and see if we can get pt's pocketbook, so she can call some relatives on Thanksgiving.  Chaplain will follow up with pt.   During encounter, Pt. Told chaplain "if my husband dies, I probably will kill myself"  -  Pt then quickly moved to different subject.      08/11/11 1300  Clinical Encounter Type  Visited With Patient  Visit Type Follow-up;Psychological support;Spiritual support  Referral From Nurse  Consult/Referral To Nurse  Spiritual Encounters  Spiritual Needs Emotional (Work on coping mechanisms, goal setting and staying present)  Stress Factors  Patient Stress Factors Family relationships;Financial concerns;Lack of knowledge;Lack of caregivers  Advance Directives (For Healthcare)  Advance Directive Patient does not have advance directive;Patient would not like information

## 2011-08-14 NOTE — Progress Notes (Signed)
  [name] is a 50 y.o. female [id number] 1960/11/02  Subjective/Objective:  Patient is still feeling very depressed. She was crying throughout the interview. She was stating that she is missing her family for Thanksgiving but when I told her that she can go home for Thanksgiving then she stated that if she goes home the girls would not be there so she will become more depressed. Patient wants to stay in the hospital during the holiday period. Patient was also concerned that she she will not be able to get her medications and she will end up back in the hospital as she don't have the money to pay the co-pay. I will start patient on on Abilify 50 mg at bedtime to boost the action  off antidepressants. I will also increase the Wellbutrin to 150 mg by mouth daily. Supportive psychotherapy given to the patient.  Filed Vitals:   08/14/11 1431  BP: 139/84  Pulse: 67  Temp: 98.7 F (37.1 C)  Resp: 16    Lab Results:   BMET    Component Value Date/Time   NA 138 08/12/2011 0340   K 3.3* 08/12/2011 0340   CL 105 08/12/2011 0340   CO2 27 08/12/2011 0340   GLUCOSE 153* 08/12/2011 0340   BUN 11 08/12/2011 0340   CREATININE 0.57 08/12/2011 0340   CALCIUM 9.0 08/12/2011 0340   GFRNONAA >90 08/12/2011 0340   GFRAA >90 08/12/2011 0340    Medications:  Scheduled:     . buPROPion  75 mg Oral BID  . enalapril  20 mg Oral Daily  . FLUoxetine  40 mg Oral Daily  . insulin aspart  0-9 Units Subcutaneous TID WC  . insulin aspart  6 Units Subcutaneous TID WC  . insulin glargine  20 Units Subcutaneous QHS  . metoCLOPramide  10 mg Oral TID AC  . traZODone  100 mg Oral QHS     PRN Meds acetaminophen, acetaminophen, ALPRAZolam, alum & mag hydroxide-simeth, dextrose, HYDROcodone-acetaminophen, ondansetron (ZOFRAN) IV, ondansetron, senna-docusate   Myanna Ziesmer S MD 08/14/2011

## 2011-08-15 LAB — GLUCOSE, CAPILLARY
Glucose-Capillary: 85 mg/dL (ref 70–99)
Glucose-Capillary: 122 mg/dL — ABNORMAL HIGH (ref 70–99)
Glucose-Capillary: 230 mg/dL — ABNORMAL HIGH (ref 70–99)
Glucose-Capillary: 184 mg/dL — ABNORMAL HIGH (ref 70–99)

## 2011-08-15 NOTE — Progress Notes (Signed)
PROGRESS NOTE  Subjective: Denies any complaints  Objective: Filed Vitals:   08/15/11 0510  BP: 128/79  Pulse: 57  Temp: 97.7 F (36.5 C)  Resp: 18   Blood pressure 128/79, pulse 57, temperature 97.7 F (36.5 C), temperature source Oral, resp. rate 18, height 5\' 4"  (1.626 m), weight 74.2 kg (163 lb 9.3 oz), SpO2 97.00%.   Intake/Output Summary (Last 24 hours) at 08/15/11 0840 Last data filed at 08/15/11 0500  Gross per 24 hour  Intake   1080 ml  Output   3161 ml  Net  -2081 ml    Exam: General: Well-appearing. Cardiovascular: Regular rate and rhythm. No murmur, rub or gallop. Respiratory: Clear to patient bilaterally. No wheezes, rales or rhonchi. Normal respiratory effort.  CBG:  Lab 08/15/11 0809 08/14/11 2230 08/14/11 1659 08/14/11 1241 08/14/11 0814  GLUCAP 85 123* 193* 273* 75   Scheduled Meds:    . ARIPiprazole  5 mg Oral QHS  . buPROPion  150 mg Oral BID  . enalapril  20 mg Oral Daily  . FLUoxetine  40 mg Oral Daily  . insulin aspart  0-9 Units Subcutaneous TID WC  . insulin aspart  6 Units Subcutaneous TID WC  . insulin glargine  20 Units Subcutaneous QHS  . metoCLOPramide  10 mg Oral TID AC  . traZODone  100 mg Oral QHS  . DISCONTD: buPROPion  75 mg Oral BID   Continuous Infusions:    Assessment/Plan: 1. Brittle diabetes mellitus:reasonably well controlled, will not change her lantus and meal coverage today 2. Hypertension:, Stable Maj. depressive disorder, recurrent: Continue psychiatric medications as per psychiatry, Can she go back to [facility] since sugars better, disposition per psych  Code Status: Full    LOS: 4 days   Zannie Cove, MD Pager: [phone] 08/15/2011, 8:40 AM

## 2011-08-16 DIAGNOSIS — E119 Type 2 diabetes mellitus without complications: Secondary | ICD-10-CM

## 2011-08-16 LAB — GLUCOSE, CAPILLARY
Glucose-Capillary: 107 mg/dL — ABNORMAL HIGH (ref 70–99)
Glucose-Capillary: 226 mg/dL — ABNORMAL HIGH (ref 70–99)

## 2011-08-16 NOTE — Progress Notes (Signed)
UR CHART REVIEWED 

## 2011-08-16 NOTE — Progress Notes (Signed)
Pt has Medicaid, Unable to assist pt with medication. MPearson, Charity fundraiser, BSN [phone]

## 2011-08-16 NOTE — Progress Notes (Signed)
  [name] is a 50 y.o. female [id number] Aug 27, 1961  Subjective/Objective:  Patient is feeling much better today she is not having mood lability or crying spells. She denies feeling suicidal or homicidal. [name] logical and goal-directed during the interview. Is not hallucinating or delusional. She was concerned about getting medications in the outpatient setting I told her that she should be able to pay for the co-pay for her medications but she told me after paying all the bills whatever the money she gets more disability she don't have any money to pay for the co-pay.  I told her to speak with the clinical social worker to see if there is any options available for her. Patient wants to go home tomorrow so before discharge please call psychiatry to followup on her to see if she is stable for discharge. No side effects reported from psych medications.  Filed Vitals:   08/16/11 0515  BP: 144/79  Pulse: 61  Temp: 98 F (36.7 C)  Resp: 14    Lab Results:   BMET    Component Value Date/Time   NA 138 08/12/2011 0340   K 3.3* 08/12/2011 0340   CL 105 08/12/2011 0340   CO2 27 08/12/2011 0340   GLUCOSE 153* 08/12/2011 0340   BUN 11 08/12/2011 0340   CREATININE 0.57 08/12/2011 0340   CALCIUM 9.0 08/12/2011 0340   GFRNONAA >90 08/12/2011 0340   GFRAA >90 08/12/2011 0340    Medications:  Scheduled:     . ARIPiprazole  5 mg Oral QHS  . buPROPion  150 mg Oral BID  . enalapril  20 mg Oral Daily  . FLUoxetine  40 mg Oral Daily  . insulin aspart  0-9 Units Subcutaneous TID WC  . insulin aspart  6 Units Subcutaneous TID WC  . insulin glargine  20 Units Subcutaneous QHS  . metoCLOPramide  10 mg Oral TID AC  . traZODone  100 mg Oral QHS     PRN Meds acetaminophen, acetaminophen, ALPRAZolam, alum & mag hydroxide-simeth, dextrose, HYDROcodone-acetaminophen, ondansetron (ZOFRAN) IV, ondansetron, senna-docusate   AHLUWALIA,SHAMSHER S MD 08/16/2011

## 2011-08-17 LAB — D-DIMER, QUANTITATIVE: D-Dimer, Quant: 0.39 ug/mL-FEU (ref 0.00–0.48)

## 2011-08-17 LAB — GLUCOSE, CAPILLARY
Glucose-Capillary: 229 mg/dL — ABNORMAL HIGH (ref 70–99)
Glucose-Capillary: 203 mg/dL — ABNORMAL HIGH (ref 70–99)

## 2011-08-17 MED ORDER — HYDROCODONE-ACETAMINOPHEN 5-325 MG PO TABS: 1.0000 | ORAL_TABLET | Freq: Four times a day (QID) | ORAL | Status: AC | PRN

## 2011-08-17 MED ORDER — FLUOXETINE HCL 40 MG PO CAPS: 40.0000 mg | ORAL_CAPSULE | Freq: Every day | ORAL | Status: DC

## 2011-08-17 MED ORDER — IBUPROFEN 800 MG PO TABS
ORAL_TABLET | ORAL | Status: AC
  Administered 2011-08-17: 400 mg via ORAL
  Filled 2011-08-17: qty 1

## 2011-08-17 MED ORDER — INSULIN ASPART 100 UNIT/ML ~~LOC~~ SOLN: 6.0000 [IU] | Freq: Three times a day (TID) | SUBCUTANEOUS | Status: DC

## 2011-08-17 MED ORDER — BUPROPION HCL 75 MG PO TABS: 150.0000 mg | ORAL_TABLET | Freq: Two times a day (BID) | ORAL | Status: DC

## 2011-08-17 MED ORDER — IBUPROFEN 400 MG PO TABS
400.0000 mg | ORAL_TABLET | Freq: Three times a day (TID) | ORAL | Status: DC
  Administered 2011-08-17 – 2011-08-19 (×6): 400 mg via ORAL
  Filled 2011-08-17 (×12): qty 1

## 2011-08-17 MED ORDER — ARIPIPRAZOLE 5 MG PO TABS: 5.0000 mg | ORAL_TABLET | Freq: Every day | ORAL | Status: DC

## 2011-08-17 MED ORDER — INSULIN GLARGINE 100 UNIT/ML ~~LOC~~ SOLN: 20.0000 [IU] | Freq: Every day | SUBCUTANEOUS | Status: DC

## 2011-08-17 NOTE — Progress Notes (Signed)
Message to MD Psychiatry, please look at the discharge psych meds( current meds),and recommend any changes if needed at discharge

## 2011-08-17 NOTE — Progress Notes (Signed)
Physician Discharge Summary  Patient ID: [name] MRN: [id number] DOB/AGE: December 03, 1960 50 y.o.  Admit date: 08/11/2011 Discharge date: 08/17/2011  Primary Care Physician:  No primary provider on file.   Discharge Diagnoses:   1. Major Depression 2. Brittle DM 3. Anxiety disorder 4. Hypertension 5. Chronic back pain 6.H/o Peripheral Neuropathy 7. Musculoskeletal Right chest/back pain  Current Discharge Medication List    START taking these medications   Details  ARIPiprazole (ABILIFY) 5 MG tablet Take 1 tablet (5 mg total) by mouth at bedtime. Qty: 15 tablet, Refills: 0    buPROPion (WELLBUTRIN) 75 MG tablet Take 2 tablets (150 mg total) by mouth 2 (two) times daily. Qty: 30 tablet, Refills: 0    FLUoxetine (PROZAC) 40 MG capsule Take 1 capsule (40 mg total) by mouth daily. Qty: 20 capsule, Refills: 0    insulin aspart (NOVOLOG) 100 UNIT/ML injection Inject 6 Units into the skin 3 (three) times daily with meals. Qty: 1 vial, Refills: 1    insulin glargine (LANTUS) 100 UNIT/ML injection Inject 20 Units into the skin at bedtime. Qty: 10 mL, Refills: 1      CONTINUE these medications which have CHANGED   Details  HYDROcodone-acetaminophen (NORCO) 5-325 MG per tablet Take 1 tablet by mouth every 6 (six) hours as needed. Qty: 15 tablet, Refills: 0      CONTINUE these medications which have NOT CHANGED   Details  ALPRAZolam (XANAX) 0.5 MG tablet Take 1 tablet (0.5 mg total) by mouth 2 (two) times daily as needed. For anxiety Qty: 20 tablet, Refills: 0    enalapril (VASOTEC) 20 MG tablet Take 20 mg by mouth daily.     HYDROcodone-acetaminophen (VICODIN) 5-500 MG per tablet Take 1 tablet by mouth daily as needed. For pain Qty: 15 tablet, Refills: 0    metoCLOPramide (REGLAN) 10 MG tablet Take 10 mg by mouth 3 (three) times daily before meals.     traZODone (DESYREL) 100 MG tablet Take 100 mg by mouth at bedtime.       STOP taking these medications     citalopram (CELEXA) 20 MG tablet      DULoxetine (CYMBALTA) 60 MG capsule          Disposition and Follow-up:  Psychiatrist in 1 week  Consults: Psychiatry  Brief H and P: 50 year old woman with a history of brittle diabetes mellitus. She had been at behavioral [facility] for treatment of recurrent depression, generalized anxiety disorder with agoraphobia;On November 17 she was transferred to [facility] long for hyperglycemia, uncontrolled brittle DM  Hospital Course:  1. Brittle DM: her insulin regimen was titrated, and has relatively stabilized on the current regimen of Lantus and novolog with meals. 2. Recurrent depression, generalized anxiety disorder with agoraphobia: she was followed by Psychiatry through her hospital course, and adjusted her cocktail of medication to Bupropion,  Abilify , Prozac, with PRN Xanax and Trazodone. She will need to be followed closely in the outpatient setting. 3. Musculoskeletal R chest and Back pain:exacerbated with movement and reproducible with palpation, will check a d-dimer just in case. Apparently going on intermittently for years.try ibuprofen/tylenol pRN.  Time spent on Discharge: Signed: Jayland Null 08/17/2011, 11:54 AM

## 2011-08-18 DIAGNOSIS — F329 Major depressive disorder, single episode, unspecified: Secondary | ICD-10-CM

## 2011-08-18 LAB — GLUCOSE, CAPILLARY
Glucose-Capillary: 392 mg/dL — ABNORMAL HIGH (ref 70–99)
Glucose-Capillary: 238 mg/dL — ABNORMAL HIGH (ref 70–99)

## 2011-08-18 NOTE — Consult Note (Signed)
Reason for Consult: Follow Up.   [name] is an 50 y.o. female.   HPI: Patient seen and evaluated. Chart reviewed. Patient was seen by this Clinical research associate with Dr. Rogers Blocker last week. At this point, she is interested in going home. She has had significant depression and reported difficulty paying for her outpatient medications. She stated today that she follows up with [facility] and that she needs a followup appointment made with them in the morning. She currently denies any significant difficulties and said that she felt safe to go home psychiatrically.  Past Medical History  Diagnosis Date  . Diabetes mellitus   . Hypertension   . Anemia   . Chronic back pain   . Neuropathy   . Hypercholesterolemia   . Major depressive disorder, recurrent   . Generalized anxiety disorder   . Panic disorder with agoraphobia     Past Surgical History  Procedure Date  . Foot surgery   . Back surgery   . Hernia repair   . Tonsillectomy and adenoidectomy   . Shoulder surgery   . Knee surgery     Family History  Problem Relation Age of Onset  . Heart disease Mother   . Lung cancer Father     Social History:  reports that she has been smoking Cigarettes.  She has a 20 pack-year smoking history. She does not have any smokeless tobacco history on file. She reports that she does not drink alcohol or use illicit drugs.  Allergies:  Allergies  Allergen Reactions  . Celecoxib Nausea Only  . Ibuprofen Nausea Only    Medications: See chart.  Results for orders placed during the hospital encounter of 08/11/11 (from the past 48 hour(s))  GLUCOSE, CAPILLARY     Status: Abnormal   Collection Time   08/16/11 12:21 PM      Component Value Range Comment   Glucose-Capillary 305 (*) 70 - 99 (mg/dL)   GLUCOSE, CAPILLARY     Status: Abnormal   Collection Time   08/16/11  4:33 PM      Component Value Range Comment   Glucose-Capillary 226 (*) 70 - 99 (mg/dL)    Comment 1 Notify RN     GLUCOSE, CAPILLARY      Status: Abnormal   Collection Time   08/16/11  8:34 PM      Component Value Range Comment   Glucose-Capillary 103 (*) 70 - 99 (mg/dL)   GLUCOSE, CAPILLARY     Status: Abnormal   Collection Time   08/17/11  7:45 AM      Component Value Range Comment   Glucose-Capillary 139 (*) 70 - 99 (mg/dL)   GLUCOSE, CAPILLARY     Status: Abnormal   Collection Time   08/17/11 12:03 PM      Component Value Range Comment   Glucose-Capillary 229 (*) 70 - 99 (mg/dL)   D-DIMER, QUANTITATIVE     Status: Normal   Collection Time   08/17/11 12:55 PM      Component Value Range Comment   D-Dimer, Quant 0.39  0.00 - 0.48 (ug/mL-FEU)   GLUCOSE, CAPILLARY     Status: Abnormal   Collection Time   08/17/11  5:34 PM      Component Value Range Comment   Glucose-Capillary 306 (*) 70 - 99 (mg/dL)   GLUCOSE, CAPILLARY     Status: Abnormal   Collection Time   08/17/11  8:59 PM      Component Value Range Comment   Glucose-Capillary 203 (*)  70 - 99 (mg/dL)    Comment 1 Documented in Chart      Comment 2 Notify RN     GLUCOSE, CAPILLARY     Status: Abnormal   Collection Time   08/18/11  7:18 AM      Component Value Range Comment   Glucose-Capillary 106 (*) 70 - 99 (mg/dL)     No results found.  ROS: Pt denied any current CP/SOB/N/V/D/HA at this time.  Blood pressure 110/70, pulse 56, temperature 97.9 F (36.6 C), temperature source Oral, resp. rate 18, height 5\' 4"  (1.626 m), weight 74.2 kg (163 lb 9.3 oz), SpO2 94.00%.  Physical Exam: done upon admission/initial consult.  MSE: Patient stated that her mood was "good". Her affect was stable and euthymic. There was no mood lability or any crying during the interview today. Her speech was normal rate, tone and volume. She denied any current auditory or visual hallucinations, suicidal ideation, thoughts of self injurious behavior, or homicidal ideation. She stated that she was ready to go home, but was still having significant medical difficulties. She also  follows up with [facility] and said that she will need potential medication samples because she has difficulty paying for medications. Her insight is limited and her judgment is poor. Nevertheless, there are no acute safety concerns and she stated that she be stable to go home at this time. Eye contact was good. Thought process was linear and goal directed.  Assessment/Plan: 1) Continue current medications per Dr. Rogers Blocker. 2) Schedule psychiatric follow up with [facility] prior to discharge. 3) Ensure pt is given medication to last her until that follow up appt in light of her difficulty paying for medications and, thus, med noncompliance. 4) Pt stable for discharge from [facility] standpoint.  Nevertheless, she remains a chronic increased risk of harm to herself in light of her past history and risk factors. [name] in a need of continued psychiatric followup for medication management to mitigate against this risk. Patient agreeable to the plan. Discussed with the team.   [name] 08/18/2011, 10:13 AM

## 2011-08-19 LAB — GLUCOSE, CAPILLARY: Glucose-Capillary: 93 mg/dL (ref 70–99)

## 2011-08-19 NOTE — Progress Notes (Signed)
Pt to be discharged home, all discharge medications and instructions reviewed and questions answered. Pt to be assisted to vehicle in wheelchair.

## 2011-08-19 NOTE — Progress Notes (Signed)
Patient d/c home today; requested Cab transportation home. Met with patient. She adamantly denies any resources to provide transportation. She states that her husband and daughter do not have cars and that other family members do not have anything to do with her.  Cab voucher provided for [employer]. Notified pt's nurse of above.  Mireille Lacombe T. Quay Simkin,BSW  08/19/11  16:07 

## 2011-08-19 NOTE — Progress Notes (Signed)
Referral from care management.  Pt in need of funds to cover co-pay for medication.  Referred care manager to [facility] main office at [facility].  Wilmon Arms and Theda Belfast control discretionary funds.

## 2011-08-19 NOTE — Progress Notes (Deleted)
Patient d/c home today; requested Cab transportation home. Met with patient. She adamantly denies any resources to provide transportation. She states that her husband and daughter do not have cars and that other family members do not have anything to do with her.  Cab voucher provided for [employer]. Notified pt's nurse of above.  Lorri Frederick. Crowder,BSW  08/19/11  16:07

## 2011-08-19 NOTE — Progress Notes (Signed)
Received request by case manager to speak with patient regarding need for glucose meter. Patient states she used to get meter through [facility]. When Medicaid changed brand of meter covered, [facility] no longer could service a medicaid Rx and she never got a replacement meter. AccuChek meters are currently covered by Medicaid and can be filled by her [facility] as long as she has a prescription. Patient currently uses Lantus pens at home. Does not have rapid acting insulin in pen-form at home (has a vial). Request MD give Rx for rapid-acting insulin in pen form for patient to use as correction and meal coverage. Reviewed need to do air-shot before each injection with an insulin pen and to count a full 10 seconds before withdrawing needle with injection.  Encouraged patient to stick with plan to stop smoking so she will have more money available for medical needs.  Informed her that I know of no resources available to help pay Medicaid co-payments, so encouraged her to apply for help with utilities, rent, etc. States that she goes to [facility] and see Dr. Victory Dakin-- but as a drop-in, not regular basis due to $3 co-payment.

## 2011-08-20 NOTE — Discharge Summary (Signed)
Patient ID:  [name]  MRN: [id number]  DOB/AGE: 52-Oct-1962 52 y.o.  Admit date: 08/11/2011  Discharge date: 08/17/2011  Primary Care Physician: No primary provider on file.  Discharge Diagnoses:  1. Major Depression  2. Brittle DM  3. Anxiety disorder  4. Hypertension  5. Chronic back pain  6.H/o Peripheral Neuropathy  7. Musculoskeletal Right chest/back pain   Current Discharge Medication List     START taking these medications    Details   ARIPiprazole (ABILIFY) 5 MG tablet  Take 1 tablet (5 mg total) by mouth at bedtime.  Qty: 15 tablet, Refills: 0     buPROPion (WELLBUTRIN) 75 MG tablet  Take 2 tablets (150 mg total) by mouth 2 (two) times daily.  Qty: 30 tablet, Refills: 0     FLUoxetine (PROZAC) 40 MG capsule  Take 1 capsule (40 mg total) by mouth daily.  Qty: 20 capsule, Refills: 0     insulin aspart (NOVOLOG) 100 UNIT/ML injection  Inject 6 Units into the skin 3 (three) times daily with meals.  Qty: 1 vial, Refills: 1     insulin glargine (LANTUS) 100 UNIT/ML injection  Inject 20 Units into the skin at bedtime.  Qty: 10 mL, Refills: 1       CONTINUE these medications which have CHANGED    Details   HYDROcodone-acetaminophen (NORCO) 5-325 MG per tablet  Take 1 tablet by mouth every 6 (six) hours as needed.  Qty: 15 tablet, Refills: 0       CONTINUE these medications which have NOT CHANGED    Details   ALPRAZolam (XANAX) 0.5 MG tablet  Take 1 tablet (0.5 mg total) by mouth 2 (two) times daily as needed. For anxiety  Qty: 20 tablet, Refills: 0     enalapril (VASOTEC) 20 MG tablet  Take 20 mg by mouth daily.     HYDROcodone-acetaminophen (VICODIN) 5-500 MG per tablet  Take 1 tablet by mouth daily as needed. For pain  Qty: 15 tablet, Refills: 0     metoCLOPramide (REGLAN) 10 MG tablet  Take 10 mg by mouth 3 (three) times daily before meals.     traZODone (DESYREL) 100 MG tablet  Take 100 mg by mouth at bedtime.       STOP taking these medications      citalopram (CELEXA) 20 MG tablet       DULoxetine (CYMBALTA) 60 MG capsule        Disposition and Follow-up:  Psychiatrist in 1 week  Consults: Psychiatry  Brief H and P:  50 year old woman with a history of brittle diabetes mellitus. She had been at behavioral [facility] for treatment of recurrent depression, generalized anxiety disorder with agoraphobia;On November 17 she was transferred to [facility] long for hyperglycemia, uncontrolled brittle DM  Hospital Course:  1. Brittle DM: her insulin regimen was titrated, and has relatively stabilized on the current regimen of Lantus and novolog with meals.  2. Recurrent depression, generalized anxiety disorder with agoraphobia: she was followed by Psychiatry through her hospital course, and adjusted her cocktail of medication to Bupropion, Abilify , Prozac, with PRN Xanax and Trazodone. She will need to be followed closely in the outpatient setting.  3. Musculoskeletal R chest and Back pain:exacerbated with movement and reproducible with palpation, will check a d-dimer just in case. Apparently going on intermittently for years.try ibuprofen/tylenol pRN.  Time spent on Discharge:   Signed:  Edgardo Petrenko  08/17/2011, 11:54 AM

## 2011-10-03 ENCOUNTER — Emergency Department (HOSPITAL_COMMUNITY): Payer: Medicaid Other

## 2011-10-03 ENCOUNTER — Encounter (HOSPITAL_COMMUNITY): Payer: Self-pay | Admitting: Emergency Medicine

## 2011-10-03 ENCOUNTER — Inpatient Hospital Stay (HOSPITAL_COMMUNITY)
Admission: EM | Admit: 2011-10-03 | Discharge: 2011-10-07 | DRG: 192 | Disposition: A | Discharge status: Home / Self Care | Payer: Medicaid Other | Attending: Internal Medicine | Admitting: Internal Medicine

## 2011-10-03 ENCOUNTER — Other Ambulatory Visit: Payer: Self-pay

## 2011-10-03 DIAGNOSIS — J449 Chronic obstructive pulmonary disease, unspecified: Secondary | ICD-10-CM | POA: Diagnosis present

## 2011-10-03 DIAGNOSIS — F17200 Nicotine dependence, unspecified, uncomplicated: Secondary | ICD-10-CM | POA: Diagnosis present

## 2011-10-03 DIAGNOSIS — M545 Low back pain, unspecified: Secondary | ICD-10-CM | POA: Diagnosis present

## 2011-10-03 DIAGNOSIS — Z8659 Personal history of other mental and behavioral disorders: Secondary | ICD-10-CM

## 2011-10-03 DIAGNOSIS — D649 Anemia, unspecified: Secondary | ICD-10-CM | POA: Diagnosis present

## 2011-10-03 DIAGNOSIS — F4001 Agoraphobia with panic disorder: Secondary | ICD-10-CM | POA: Diagnosis present

## 2011-10-03 DIAGNOSIS — J441 Chronic obstructive pulmonary disease with (acute) exacerbation: Principal | ICD-10-CM | POA: Diagnosis present

## 2011-10-03 DIAGNOSIS — J40 Bronchitis, not specified as acute or chronic: Secondary | ICD-10-CM

## 2011-10-03 DIAGNOSIS — F329 Major depressive disorder, single episode, unspecified: Secondary | ICD-10-CM

## 2011-10-03 DIAGNOSIS — I10 Essential (primary) hypertension: Secondary | ICD-10-CM | POA: Diagnosis present

## 2011-10-03 DIAGNOSIS — G589 Mononeuropathy, unspecified: Secondary | ICD-10-CM | POA: Diagnosis present

## 2011-10-03 DIAGNOSIS — IMO0001 Reserved for inherently not codable concepts without codable children: Secondary | ICD-10-CM | POA: Diagnosis present

## 2011-10-03 DIAGNOSIS — E7800 Pure hypercholesterolemia, unspecified: Secondary | ICD-10-CM | POA: Diagnosis present

## 2011-10-03 DIAGNOSIS — Z720 Tobacco use: Secondary | ICD-10-CM | POA: Diagnosis present

## 2011-10-03 LAB — BASIC METABOLIC PANEL
CO2: 27 mEq/L (ref 19–32)
Calcium: 9.9 mg/dL (ref 8.4–10.5)
Creatinine, Ser: 0.9 mg/dL (ref 0.50–1.10)
Glucose, Bld: 268 mg/dL — ABNORMAL HIGH (ref 70–99)

## 2011-10-03 LAB — URINALYSIS, ROUTINE W REFLEX MICROSCOPIC
Bilirubin Urine: NEGATIVE
Nitrite: NEGATIVE
Specific Gravity, Urine: 1.026 (ref 1.005–1.030)
pH: 6.5 (ref 5.0–8.0)

## 2011-10-03 LAB — POCT I-STAT TROPONIN I

## 2011-10-03 LAB — CBC
Hemoglobin: 16 g/dL — ABNORMAL HIGH (ref 12.0–15.0)
MCH: 29.6 pg (ref 26.0–34.0)
MCV: 83.9 fL (ref 78.0–100.0)
RBC: 5.41 MIL/uL — ABNORMAL HIGH (ref 3.87–5.11)

## 2011-10-03 LAB — URINE MICROSCOPIC-ADD ON

## 2011-10-03 MED ORDER — IPRATROPIUM BROMIDE 0.02 % IN SOLN
0.5000 mg | Freq: Once | RESPIRATORY_TRACT | Status: AC
  Administered 2011-10-03: 0.5 mg via RESPIRATORY_TRACT
  Filled 2011-10-03: qty 2.5

## 2011-10-03 MED ORDER — ALBUTEROL SULFATE (5 MG/ML) 0.5% IN NEBU
5.0000 mg | INHALATION_SOLUTION | Freq: Once | RESPIRATORY_TRACT | Status: AC
  Administered 2011-10-03: 5 mg via RESPIRATORY_TRACT
  Filled 2011-10-03: qty 1

## 2011-10-03 MED ORDER — AZITHROMYCIN 250 MG PO TABS: ORAL_TABLET | ORAL | Status: DC

## 2011-10-03 MED ORDER — ALBUTEROL SULFATE HFA 108 (90 BASE) MCG/ACT IN AERS: 2.0000 | INHALATION_SPRAY | RESPIRATORY_TRACT | Status: DC | PRN

## 2011-10-03 MED ORDER — SODIUM CHLORIDE 0.9 % IV BOLUS (SEPSIS)
1000.0000 mL | Freq: Once | INTRAVENOUS | Status: AC
  Administered 2011-10-03: 1000 mL via INTRAVENOUS

## 2011-10-03 MED ORDER — IPRATROPIUM BROMIDE 0.02 % IN SOLN
0.5000 mg | Freq: Once | RESPIRATORY_TRACT | Status: AC
Start: 2011-10-03 — End: 2011-10-03
  Administered 2011-10-03: 0.5 mg via RESPIRATORY_TRACT
  Filled 2011-10-03: qty 2.5

## 2011-10-03 NOTE — ED Notes (Signed)
Per EMS: pt with cough, n/v, body aches for 4 days

## 2011-10-03 NOTE — ED Provider Notes (Signed)
History     CSN: [id number]  Arrival date & time 10/03/11  4098   First MD Initiated Contact with Patient 10/03/11 2143      Chief Complaint  Patient presents with  . Influenza     HPI  History provided by the patient. Patient is a 51 year old female with past history of diabetes, hypertension, hypercholesterolemia, chronic pain presents with complaints of productive cough, fever, chills and bodyaches for the past 3-4 days. Patient reports symptoms came on acutely and have been persistent. She has tried some over-the-counter cough medicines without significant improvement. Patient reports occasional symptoms of shortness of breath but denies this at this time. Patient does report having an episode of vomiting with coughing fits. She reports mostly vomiting phlegm. She denies any hemoptysis. She denies any chest pains or heart palpitations. Patient denies any abdominal pain, diarrhea or constipation. She reports decreased appetite. Patient denies other symptoms. Patient has no other significant past medical history.    Past Medical History  Diagnosis Date  . Diabetes mellitus   . Hypertension   . Anemia   . Chronic back pain   . Neuropathy   . Hypercholesterolemia   . Major depressive disorder, recurrent   . Generalized anxiety disorder   . Panic disorder with agoraphobia     Past Surgical History  Procedure Date  . Foot surgery   . Back surgery   . Hernia repair   . Tonsillectomy and adenoidectomy   . Shoulder surgery   . Knee surgery     Family History  Problem Relation Age of Onset  . Heart disease Mother   . Lung cancer Father     History  Substance Use Topics  . Smoking status: Current Everyday Smoker -- 1.0 packs/day for 20 years    Types: Cigarettes  . Smokeless tobacco: Not on file  . Alcohol Use: No    OB History    Grav Para Term Preterm Abortions TAB SAB Ect Mult Living                  Review of Systems  Constitutional: Positive for fever,  chills and appetite change.  HENT: Positive for rhinorrhea. Negative for congestion and sore throat.   Respiratory: Positive for cough and shortness of breath.   Cardiovascular: Negative for chest pain and palpitations.  Gastrointestinal: Positive for nausea and vomiting. Negative for abdominal pain, diarrhea and constipation.  Genitourinary: Negative for dysuria, frequency, hematuria, flank pain, vaginal bleeding and vaginal discharge.  All other systems reviewed and are negative.    Allergies  Celecoxib and Ibuprofen  Home Medications   Current Outpatient Rx  Name Route Sig Dispense Refill  . ALPRAZOLAM 0.5 MG PO TABS Oral Take 1 tablet (0.5 mg total) by mouth 2 (two) times daily as needed. For anxiety 20 tablet 0  . BUPROPION HCL 75 MG PO TABS Oral Take 2 tablets (150 mg total) by mouth 2 (two) times daily. 30 tablet 0  . ENALAPRIL MALEATE 20 MG PO TABS Oral Take 20 mg by mouth daily.     Marland Kitchen FLUOXETINE HCL 40 MG PO CAPS Oral Take 1 capsule (40 mg total) by mouth daily. 20 capsule 0  . HYDROCODONE-ACETAMINOPHEN 5-500 MG PO TABS Oral Take 1 tablet by mouth daily as needed. For pain 15 tablet 0  . INSULIN ASPART 100 UNIT/ML [place] SOLN Subcutaneous Inject 6 Units into the skin 3 (three) times daily with meals. 1 vial 1  . INSULIN GLARGINE 100 UNIT/ML [place]  SOLN Subcutaneous Inject 20 Units into the skin at bedtime. 10 mL 1  . METOCLOPRAMIDE HCL 10 MG PO TABS Oral Take 10 mg by mouth 3 (three) times daily before meals.     . TRAZODONE HCL 100 MG PO TABS Oral Take 100 mg by mouth at bedtime.       BP 124/76  Pulse 115  Temp(Src) 100.8 F (38.2 C) (Oral)  Resp 18  SpO2 98%  Physical Exam  Nursing note and vitals reviewed. Constitutional: [name] oriented to person, place, and time. She appears well-developed and well-nourished. No distress.  HENT:  Head: Normocephalic and atraumatic.  Mouth/Throat: Oropharynx is clear and moist.  Neck: Normal range of motion. Neck supple.       No  meningeal signs  Cardiovascular: Normal rate and regular rhythm.   Pulmonary/Chest: Effort normal. No respiratory distress. She has wheezes. She has no rales. She exhibits no tenderness.  Abdominal: Soft. She exhibits no distension. There is no tenderness. There is no rebound and no guarding.  Lymphadenopathy:    She has no cervical adenopathy.  Neurological: [name] alert and oriented to person, place, and time.  Skin: Skin is warm and dry. No rash noted.  Psychiatric: She has a normal mood and affect. Her behavior is normal.    ED Course  Procedures (including critical care time)  Labs Reviewed  CBC - Abnormal; Notable for the following:    RBC 5.41 (*)    Hemoglobin 16.0 (*)    All other components within normal limits  BASIC METABOLIC PANEL - Abnormal; Notable for the following:    Sodium 132 (*)    Potassium 3.4 (*)    Chloride 92 (*)    Glucose, Bld 268 (*)    GFR calc non Af Amer 72 (*)    GFR calc Af Amer 84 (*)    All other components within normal limits  POCT I-STAT TROPONIN I  I-STAT TROPONIN I   Results for orders placed during the hospital encounter of 10/03/11  CBC      Component Value Range   WBC 6.2  4.0 - 10.5 (K/uL)   RBC 5.41 (*) 3.87 - 5.11 (MIL/uL)   Hemoglobin 16.0 (*) 12.0 - 15.0 (g/dL)   HCT 16.1  09.6 - 04.5 (%)   MCV 83.9  78.0 - 100.0 (fL)   MCH 29.6  26.0 - 34.0 (pg)   MCHC 35.2  30.0 - 36.0 (g/dL)   RDW 40.9  81.1 - 91.4 (%)   Platelets 243  150 - 400 (K/uL)  BASIC METABOLIC PANEL      Component Value Range   Sodium 132 (*) 135 - 145 (mEq/L)   Potassium 3.4 (*) 3.5 - 5.1 (mEq/L)   Chloride 92 (*) 96 - 112 (mEq/L)   CO2 27  19 - 32 (mEq/L)   Glucose, Bld 268 (*) 70 - 99 (mg/dL)   BUN 11  6 - 23 (mg/dL)   Creatinine, Ser 7.82  0.50 - 1.10 (mg/dL)   Calcium 9.9  8.4 - 95.6 (mg/dL)   GFR calc non Af Amer 72 (*) >90 (mL/min)   GFR calc Af Amer 84 (*) >90 (mL/min)  POCT I-STAT TROPONIN I      Component Value Range   Troponin i, poc 0.00   0.00 - 0.08 (ng/mL)   Comment 3           URINALYSIS, ROUTINE W REFLEX MICROSCOPIC      Component Value Range  Color, Urine YELLOW  YELLOW    APPearance TURBID (*) CLEAR    Specific Gravity, Urine 1.026  1.005 - 1.030    pH 6.5  5.0 - 8.0    Glucose, UA >1000 (*) NEGATIVE (mg/dL)   Hgb urine dipstick TRACE (*) NEGATIVE    Bilirubin Urine NEGATIVE  NEGATIVE    Ketones, ur 15 (*) NEGATIVE (mg/dL)   Protein, ur 30 (*) NEGATIVE (mg/dL)   Urobilinogen, UA 0.2  0.0 - 1.0 (mg/dL)   Nitrite NEGATIVE  NEGATIVE    Leukocytes, UA NEGATIVE  NEGATIVE   URINE MICROSCOPIC-ADD ON      Component Value Range   Squamous Epithelial / LPF MANY (*) RARE    WBC, UA 0-2  <3 (WBC/hpf)   RBC / HPF 0-2  <3 (RBC/hpf)   Bacteria, UA MANY (*) RARE       Dg Chest 2 View  10/03/2011  *RADIOLOGY REPORT*  Clinical Data: Chest pain and cough.  CHEST - 2 VIEW  Comparison: Chest x-ray 07/21/2011.  Findings: The cardiac silhouette, mediastinal and hilar contours are within normal limits and stable.  There are chronic lung changes but no acute pulmonary findings.  Remote healed rib fractures are noted.  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  IMPRESSION: Chronic lung changes but no acute pulmonary findings.  Original Report Authenticated By: P. Loralie Champagne, M.D.     1. Bronchitis   2. COPD (chronic obstructive pulmonary disease)       MDM  9:50 PM patient seen and evaluated. Patient no acute distress.  Labs with some signs for dehydration. Will give IV fluids. Patient also with wheezing on exam his symptoms for bronchitis. Patient is a smoker but states she hasn't smoked for the past 4 days and hopes to continue to quit. Chest x-ray without findings for pneumonia.   Patient complains of still having symptoms of shortness of breath. O2 saturation remains around 89-90% on room air at rest. She continues to have diffuse wheezing throughout lungs. Oxygen drops lower  while ambulating.  Have spoken with [facility] hospitalist. They will admit patient.    Angus Seller, [place] 10/04/11 [phone]

## 2011-10-04 ENCOUNTER — Encounter (HOSPITAL_COMMUNITY): Payer: Self-pay | Admitting: Internal Medicine

## 2011-10-04 DIAGNOSIS — Z720 Tobacco use: Secondary | ICD-10-CM | POA: Diagnosis present

## 2011-10-04 DIAGNOSIS — J449 Chronic obstructive pulmonary disease, unspecified: Secondary | ICD-10-CM | POA: Diagnosis present

## 2011-10-04 LAB — CBC
HCT: 41 % (ref 36.0–46.0)
MCHC: 34.9 g/dL (ref 30.0–36.0)
Platelets: 191 10*3/uL (ref 150–400)
RDW: 12.5 % (ref 11.5–15.5)
WBC: 4.2 10*3/uL (ref 4.0–10.5)

## 2011-10-04 LAB — GLUCOSE, CAPILLARY
Glucose-Capillary: 486 mg/dL — ABNORMAL HIGH (ref 70–99)
Glucose-Capillary: 437 mg/dL — ABNORMAL HIGH (ref 70–99)
Glucose-Capillary: 428 mg/dL — ABNORMAL HIGH (ref 70–99)
Glucose-Capillary: 360 mg/dL — ABNORMAL HIGH (ref 70–99)

## 2011-10-04 LAB — CREATININE, SERUM
Creatinine, Ser: 0.62 mg/dL (ref 0.50–1.10)
GFR calc non Af Amer: >90 mL/min (ref >90)

## 2011-10-04 MED ORDER — SALINE SPRAY 0.65 % NA SOLN
1.0000 | NASAL | Status: DC | PRN
  Administered 2011-10-04: 1 via NASAL
  Filled 2011-10-04: qty 44

## 2011-10-04 MED ORDER — BUPROPION HCL 75 MG PO TABS
150.0000 mg | ORAL_TABLET | Freq: Two times a day (BID) | ORAL | Status: DC
  Administered 2011-10-04 – 2011-10-07 (×7): 150 mg via ORAL
  Filled 2011-10-04 (×9): qty 2

## 2011-10-04 MED ORDER — SODIUM CHLORIDE 0.9 % IV SOLN
INTRAVENOUS | Status: DC
  Administered 2011-10-04: 4.3 [IU]/h via INTRAVENOUS
  Filled 2011-10-04 (×2): qty 1

## 2011-10-04 MED ORDER — ALPRAZOLAM 0.5 MG PO TABS
0.5000 mg | ORAL_TABLET | Freq: Two times a day (BID) | ORAL | Status: DC | PRN
  Administered 2011-10-04 – 2011-10-07 (×5): 0.5 mg via ORAL
  Filled 2011-10-04 (×5): qty 1

## 2011-10-04 MED ORDER — INSULIN ASPART 100 UNIT/ML ~~LOC~~ SOLN
0.0000 [IU] | Freq: Three times a day (TID) | SUBCUTANEOUS | Status: DC
  Administered 2011-10-05: 7 [IU] via SUBCUTANEOUS
  Administered 2011-10-05 (×2): 20 [IU] via SUBCUTANEOUS
  Administered 2011-10-06: 4 [IU] via SUBCUTANEOUS
  Administered 2011-10-06: 15 [IU] via SUBCUTANEOUS
  Administered 2011-10-06: 20 [IU] via SUBCUTANEOUS
  Administered 2011-10-07 (×2): 11 [IU] via SUBCUTANEOUS

## 2011-10-04 MED ORDER — TRAZODONE HCL 100 MG PO TABS
100.0000 mg | ORAL_TABLET | Freq: Every day | ORAL | Status: DC
  Administered 2011-10-04 – 2011-10-06 (×3): 100 mg via ORAL
  Filled 2011-10-04 (×5): qty 1

## 2011-10-04 MED ORDER — FLUOXETINE HCL 20 MG PO CAPS
40.0000 mg | ORAL_CAPSULE | Freq: Every day | ORAL | Status: DC
  Administered 2011-10-04 – 2011-10-07 (×4): 40 mg via ORAL
  Filled 2011-10-04 (×4): qty 2

## 2011-10-04 MED ORDER — INSULIN ASPART 100 UNIT/ML ~~LOC~~ SOLN
6.0000 [IU] | Freq: Three times a day (TID) | SUBCUTANEOUS | Status: DC
  Administered 2011-10-04 – 2011-10-06 (×7): 6 [IU] via SUBCUTANEOUS
  Filled 2011-10-04: qty 1
  Filled 2011-10-04: qty 3
  Filled 2011-10-04: qty 1

## 2011-10-04 MED ORDER — METHYLPREDNISOLONE SODIUM SUCC 125 MG IJ SOLR
125.0000 mg | Freq: Once | INTRAMUSCULAR | Status: AC
  Administered 2011-10-04: 125 mg via INTRAVENOUS
  Filled 2011-10-04: qty 2

## 2011-10-04 MED ORDER — SODIUM CHLORIDE 0.9 % IV SOLN: INTRAVENOUS | Status: DC

## 2011-10-04 MED ORDER — SODIUM CHLORIDE 0.9 % IV SOLN
INTRAVENOUS | Status: DC
  Administered 2011-10-05 – 2011-10-07 (×6): via INTRAVENOUS

## 2011-10-04 MED ORDER — SODIUM CHLORIDE 0.9 % IV SOLN
INTRAVENOUS | Status: DC
  Administered 2011-10-04: 09:00:00 via INTRAVENOUS

## 2011-10-04 MED ORDER — ACETAMINOPHEN 325 MG PO TABS: 650.0000 mg | ORAL_TABLET | Freq: Four times a day (QID) | ORAL | Status: DC | PRN

## 2011-10-04 MED ORDER — ALBUTEROL SULFATE (5 MG/ML) 0.5% IN NEBU
2.5000 mg | INHALATION_SOLUTION | Freq: Four times a day (QID) | RESPIRATORY_TRACT | Status: DC
  Administered 2011-10-04 – 2011-10-07 (×13): 2.5 mg via RESPIRATORY_TRACT
  Filled 2011-10-04 (×14): qty 0.5

## 2011-10-04 MED ORDER — ENOXAPARIN SODIUM 40 MG/0.4ML ~~LOC~~ SOLN
40.0000 mg | SUBCUTANEOUS | Status: DC
  Administered 2011-10-04 – 2011-10-07 (×4): 40 mg via SUBCUTANEOUS
  Filled 2011-10-04 (×4): qty 0.4

## 2011-10-04 MED ORDER — ENALAPRIL MALEATE 20 MG PO TABS
20.0000 mg | ORAL_TABLET | Freq: Every day | ORAL | Status: DC
  Administered 2011-10-04 – 2011-10-07 (×4): 20 mg via ORAL
  Filled 2011-10-04 (×4): qty 1

## 2011-10-04 MED ORDER — MOXIFLOXACIN HCL IN NACL 400 MG/250ML IV SOLN
400.0000 mg | INTRAVENOUS | Status: DC
  Administered 2011-10-04 – 2011-10-06 (×3): 400 mg via INTRAVENOUS
  Filled 2011-10-04 (×4): qty 250

## 2011-10-04 MED ORDER — INSULIN GLARGINE 100 UNIT/ML ~~LOC~~ SOLN
20.0000 [IU] | Freq: Every day | SUBCUTANEOUS | Status: DC
  Administered 2011-10-05: 20 [IU] via SUBCUTANEOUS
  Filled 2011-10-04: qty 3

## 2011-10-04 MED ORDER — METOCLOPRAMIDE HCL 10 MG PO TABS
10.0000 mg | ORAL_TABLET | Freq: Three times a day (TID) | ORAL | Status: DC
  Administered 2011-10-04 – 2011-10-07 (×10): 10 mg via ORAL
  Filled 2011-10-04 (×11): qty 1

## 2011-10-04 MED ORDER — METHYLPREDNISOLONE SODIUM SUCC 125 MG IJ SOLR
60.0000 mg | INTRAMUSCULAR | Status: DC
  Administered 2011-10-04 – 2011-10-05 (×7): 60 mg via INTRAVENOUS
  Filled 2011-10-04 (×13): qty 2

## 2011-10-04 MED ORDER — INSULIN REGULAR BOLUS VIA INFUSION
0.0000 [IU] | Freq: Three times a day (TID) | INTRAVENOUS | Status: DC
  Filled 2011-10-04 (×4): qty 10

## 2011-10-04 MED ORDER — HYDROCODONE-ACETAMINOPHEN 5-325 MG PO TABS
1.0000 | ORAL_TABLET | ORAL | Status: DC | PRN
  Administered 2011-10-04 – 2011-10-07 (×10): 1 via ORAL
  Filled 2011-10-04 (×10): qty 1

## 2011-10-04 MED ORDER — INSULIN ASPART 100 UNIT/ML ~~LOC~~ SOLN
0.0000 [IU] | Freq: Every day | SUBCUTANEOUS | Status: DC
  Administered 2011-10-06: 3 [IU] via SUBCUTANEOUS

## 2011-10-04 MED ORDER — DEXTROSE-NACL 5-0.45 % IV SOLN: INTRAVENOUS | Status: DC

## 2011-10-04 MED ORDER — HYDROMORPHONE HCL PF 1 MG/ML IJ SOLN: 1.0000 mg | INTRAMUSCULAR | Status: DC | PRN

## 2011-10-04 MED ORDER — ALBUTEROL SULFATE (5 MG/ML) 0.5% IN NEBU
2.5000 mg | INHALATION_SOLUTION | RESPIRATORY_TRACT | Status: DC | PRN
  Filled 2011-10-04: qty 0.5

## 2011-10-04 MED ORDER — DEXTROSE 50 % IV SOLN: 25.0000 mL | INTRAVENOUS | Status: DC | PRN

## 2011-10-04 MED ORDER — INSULIN ASPART 100 UNIT/ML ~~LOC~~ SOLN
25.0000 [IU] | Freq: Once | SUBCUTANEOUS | Status: DC
  Filled 2011-10-04: qty 1

## 2011-10-04 MED ORDER — ACETAMINOPHEN 650 MG RE SUPP: 650.0000 mg | Freq: Four times a day (QID) | RECTAL | Status: DC | PRN

## 2011-10-04 NOTE — ED Notes (Signed)
Pt ambulated with pulse ox.  95 at beginning and at middle patient said she couldn't breathe.  Pt was in no distress. Pt returned to room with sats of 91-92%.  Pt in no resp distress.

## 2011-10-04 NOTE — H&P (Signed)
PCP: None   Chief Complaint: Shortness of breath, cough   HPI: [name] is an 51 y.o. female with history of diabetes, anemia, hypertension, chronic low back pain, depression, hypercholesterolemia, and unfortunately active tobacco use, presents to the emergency room with wheezing, nonproductive cough, shortness of breath, muscle aches for 1 day, and low-grade fever. She has no distant travel or any ill contact. She denied any chest pain. She was never given a diagnosis of emphysema, however she has been a long-time smoker. Evaluation in emergency room included a chest x-ray which showed chronic changes but no acute infiltrate, a urinalysis with many epithelial cells, negative cardiac markers, and a normal white count with hemoglobin of 16 g per decaliter. After she was given nebulizers and IV steroids, hospitalist was asked to admit her for COPD exacerbation.  Rewiew of Systems:  The patient denies anorexia, fever, weight loss,, vision loss, decreased hearing, hoarseness, chest pain, syncope, , peripheral edema, balance deficits, hemoptysis, abdominal pain, melena, hematochezia, severe indigestion/heartburn, hematuria, incontinence, genital sores, muscle weakness, suspicious skin lesions, transient blindness, difficulty walking, depression, unusual weight change, abnormal bleeding, enlarged lymph nodes, angioedema, and breast masses.   Past Medical History  Diagnosis Date  . Diabetes mellitus   . Hypertension   . Anemia   . Chronic back pain   . Neuropathy   . Hypercholesterolemia   . Major depressive disorder, recurrent   . Generalized anxiety disorder   . Panic disorder with agoraphobia     Past Surgical History  Procedure Date  . Foot surgery   . Back surgery   . Hernia repair   . Tonsillectomy and adenoidectomy   . Shoulder surgery   . Knee surgery     Medications:  HOME MEDS: Prior to Admission medications   Medication Sig Start Date End Date Taking? Authorizing  Provider  ALPRAZolam Prudy Feeler) 0.5 MG tablet Take 1 tablet (0.5 mg total) by mouth 2 (two) times daily as needed. For anxiety 08/04/11  Yes Simbiso Ranga  buPROPion (WELLBUTRIN) 75 MG tablet Take 2 tablets (150 mg total) by mouth 2 (two) times daily. 08/17/11 08/16/12 Yes Preetha Joseph  enalapril (VASOTEC) 20 MG tablet Take 20 mg by mouth daily.    Yes Historical Provider, MD  FLUoxetine (PROZAC) 40 MG capsule Take 1 capsule (40 mg total) by mouth daily. 08/17/11 08/16/12 Yes Preetha Joseph  HYDROcodone-acetaminophen (VICODIN) 5-500 MG per tablet Take 1 tablet by mouth daily as needed. For pain 08/04/11  Yes Simbiso Ranga  insulin aspart (NOVOLOG) 100 UNIT/ML injection Inject 6 Units into the skin 3 (three) times daily with meals. 08/17/11 08/16/12 Yes Preetha Joseph  insulin glargine (LANTUS) 100 UNIT/ML injection Inject 20 Units into the skin at bedtime. 08/17/11 08/16/12 Yes Preetha Joseph  metoCLOPramide (REGLAN) 10 MG tablet Take 10 mg by mouth 3 (three) times daily before meals.    Yes Historical Provider, MD  traZODone (DESYREL) 100 MG tablet Take 100 mg by mouth at bedtime.    Yes Historical Provider, MD     Allergies:  Allergies  Allergen Reactions  . Celecoxib Nausea Only  . Ibuprofen Nausea Only    Social History:   reports that she has been smoking Cigarettes.  She has a 20 pack-year smoking history. She does not have any smokeless tobacco history on file. She reports that she does not drink alcohol or use illicit drugs.  Family History: Family History  Problem Relation Age of Onset  . Heart disease Mother   . Lung  cancer Father      Physical Exam: Filed Vitals:   10/04/11 0230 10/04/11 0245 10/04/11 0300 10/04/11 0315  BP: 131/63 134/115 143/79 130/63  Pulse: 94 100 102 95  Temp:      TempSrc:      Resp:      SpO2: 95% 97% 97% 93%   Blood pressure 130/63, pulse 95, temperature 97.7 F (36.5 C), temperature source Oral, resp. rate 29, SpO2 93.00%.  GEN:   Pleasant  person lying in the stretcher in no acute distress; cooperative with exam PSYCH:  alert and oriented x4; does not appear anxious does not appear depressed; affect is normal HEENT: Mucous membranes pink and anicteric; PERRLA; EOM intact; no cervical lymphadenopathy nor thyromegaly or carotid bruit; no JVD; Breasts:: Not examined CHEST WALL: No tenderness CHEST: [name] tachypneic, with tight inspiratory and expiratory wheezes, and audible rales in her right mid field. HEART: Regular rate and rhythm; no murmurs rubs or gallops BACK: No kyphosis or scoliosis; no CVA tenderness ABDOMEN: Obese, soft non-tender; no masses, no organomegaly, normal abdominal bowel sounds; no pannus; no intertriginous candida. Rectal Exam: Not done EXTREMITIES: No bone or joint deformity; age-appropriate arthropathy of the hands and knees; no edema; no ulcerations. Genitalia: not examined PULSES: 2+ and symmetric SKIN: Normal hydration no rash or ulceration CNS: Cranial nerves 2-12 grossly intact no focal neurologic deficit   Labs & Imaging Results for orders placed during the hospital encounter of 10/03/11 (from the past 48 hour(s))  CBC     Status: Abnormal   Collection Time   10/03/11  9:11 PM      Component Value Range Comment   WBC 6.2  4.0 - 10.5 (K/uL)    RBC 5.41 (*) 3.87 - 5.11 (MIL/uL)    Hemoglobin 16.0 (*) 12.0 - 15.0 (g/dL)    HCT 19.1  47.8 - 29.5 (%)    MCV 83.9  78.0 - 100.0 (fL)    MCH 29.6  26.0 - 34.0 (pg)    MCHC 35.2  30.0 - 36.0 (g/dL)    RDW 62.1  30.8 - 65.7 (%)    Platelets 243  150 - 400 (K/uL)   BASIC METABOLIC PANEL     Status: Abnormal   Collection Time   10/03/11  9:11 PM      Component Value Range Comment   Sodium 132 (*) 135 - 145 (mEq/L)    Potassium 3.4 (*) 3.5 - 5.1 (mEq/L)    Chloride 92 (*) 96 - 112 (mEq/L)    CO2 27  19 - 32 (mEq/L)    Glucose, Bld 268 (*) 70 - 99 (mg/dL)    BUN 11  6 - 23 (mg/dL)    Creatinine, Ser 8.46  0.50 - 1.10 (mg/dL)    Calcium 9.9   8.4 - 10.5 (mg/dL)    GFR calc non Af Amer 72 (*) >90 (mL/min)    GFR calc Af Amer 84 (*) >90 (mL/min)   POCT I-STAT TROPONIN I     Status: Normal   Collection Time   10/03/11  9:19 PM      Component Value Range Comment   Troponin i, poc 0.00  0.00 - 0.08 (ng/mL)    Comment 3            URINALYSIS, ROUTINE W REFLEX MICROSCOPIC     Status: Abnormal   Collection Time   10/03/11 10:20 PM      Component Value Range Comment   Color, Urine YELLOW  YELLOW     APPearance TURBID (*) CLEAR     Specific Gravity, Urine 1.026  1.005 - 1.030     pH 6.5  5.0 - 8.0     Glucose, UA >1000 (*) NEGATIVE (mg/dL)    Hgb urine dipstick TRACE (*) NEGATIVE     Bilirubin Urine NEGATIVE  NEGATIVE     Ketones, ur 15 (*) NEGATIVE (mg/dL)    Protein, ur 30 (*) NEGATIVE (mg/dL)    Urobilinogen, UA 0.2  0.0 - 1.0 (mg/dL)    Nitrite NEGATIVE  NEGATIVE     Leukocytes, UA NEGATIVE  NEGATIVE    URINE MICROSCOPIC-ADD ON     Status: Abnormal   Collection Time   10/03/11 10:20 PM      Component Value Range Comment   Squamous Epithelial / LPF MANY (*) RARE     WBC, UA 0-2  <3 (WBC/hpf)    RBC / HPF 0-2  <3 (RBC/hpf)    Bacteria, UA MANY (*) RARE     Dg Chest 2 View  10/03/2011  *RADIOLOGY REPORT*  Clinical Data: Chest pain and cough.  CHEST - 2 VIEW  Comparison: Chest x-ray 07/21/2011.  Findings: The cardiac silhouette, mediastinal and hilar contours are within normal limits and stable.  There are chronic lung changes but no acute pulmonary findings.  Remote healed rib fractures are noted.  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  IMPRESSION: Chronic lung changes but no acute pulmonary findings.  Original Report Authenticated By: P. Loralie Champagne, M.D.      Assessment Present on Admission:  .COPD exacerbation .Tobacco abuse Diabetes  PLAN: Will admit her to telemetry. She will continue with nebulizer treatments. I will continue IV steroids, frequent nebulizers, and  intravenous Avelox. I recommended she stop smoking. For diabetes, will adjust her insulin dosages, and place her on insulin sliding scale. [name] stable, full code, and will be admitted to triad hospitalist service.   Other plans as per orders.    Shayne Diguglielmo 10/04/2011, 4:17 AM

## 2011-10-04 NOTE — ED Notes (Signed)
Adm md called to receive orders for pt's cbg. md unable to locate order set will call back with order set

## 2011-10-04 NOTE — ED Notes (Signed)
Cleotis Lema, MD (Team 3) repaged for critical high blood sugar.

## 2011-10-04 NOTE — ED Provider Notes (Signed)
Medical screening examination/treatment/procedure(s) were performed by non-physician practitioner and as supervising physician I was immediately available for consultation/collaboration.  Geoffery Lyons, MD 10/04/11 2023

## 2011-10-04 NOTE — ED Notes (Signed)
RT at bedside.

## 2011-10-04 NOTE — ED Notes (Signed)
RT called for treatment

## 2011-10-04 NOTE — ED Notes (Signed)
  CBG 342  

## 2011-10-04 NOTE — Progress Notes (Signed)
Chart reviewed, patient seen and examined complaining of cough and shortness of breath.  Vitals stable, O2 sat 94% on 2 L of oxygen. Lung exam showed diffuse expiratory wheezing  I agree with our assessment and plan as per Dr. Nedra Hai Continue nebs, Solu-Medrol, Avelox Continue Lantus insulin and NovoLog meal coverage, I would add resistant insulin scale.

## 2011-10-04 NOTE — ED Notes (Signed)
Attempted to give report to the RN on the 4th floor. Secretary states that once her report is done, will call back.

## 2011-10-04 NOTE — ED Notes (Addendum)
CBG 486mg /dL. Team 3 Cleotis Lema, MD paged.

## 2011-10-04 NOTE — Progress Notes (Signed)
This RN called Vincenza Hews in ER for report at 73. Was told by him that he couldn't give report at that time and when I asked if I could give him my number to call directly back to he said he had nothing to write with. He states he will get another nurse to call me report.

## 2011-10-04 NOTE — ED Notes (Signed)
Adm md want's pt placed on glucose stabilizer

## 2011-10-05 LAB — CBC
HCT: 35.8 % — ABNORMAL LOW (ref 36.0–46.0)
MCHC: 34.9 g/dL (ref 30.0–36.0)
Platelets: 203 10*3/uL (ref 150–400)
RDW: 12.3 % (ref 11.5–15.5)

## 2011-10-05 LAB — GLUCOSE, CAPILLARY
Glucose-Capillary: 121 mg/dL — ABNORMAL HIGH (ref 70–99)
Glucose-Capillary: 132 mg/dL — ABNORMAL HIGH (ref 70–99)
Glucose-Capillary: 381 mg/dL — ABNORMAL HIGH (ref 70–99)
Glucose-Capillary: 375 mg/dL — ABNORMAL HIGH (ref 70–99)
Glucose-Capillary: 190 mg/dL — ABNORMAL HIGH (ref 70–99)

## 2011-10-05 LAB — BASIC METABOLIC PANEL
BUN: 23 mg/dL (ref 6–23)
Calcium: 9.4 mg/dL (ref 8.4–10.5)
Chloride: 101 mEq/L (ref 96–112)
Creatinine, Ser: 0.76 mg/dL (ref 0.50–1.10)
GFR calc Af Amer: >90 mL/min (ref >90)

## 2011-10-05 MED ORDER — GUAIFENESIN-DM 100-10 MG/5ML PO SYRP
5.0000 mL | ORAL_SOLUTION | ORAL | Status: DC | PRN
  Administered 2011-10-05 – 2011-10-07 (×9): 5 mL via ORAL
  Filled 2011-10-05 (×9): qty 10

## 2011-10-05 MED ORDER — BENZONATATE 100 MG PO CAPS
100.0000 mg | ORAL_CAPSULE | Freq: Three times a day (TID) | ORAL | Status: DC | PRN
  Administered 2011-10-05 – 2011-10-07 (×4): 100 mg via ORAL
  Filled 2011-10-05 (×4): qty 1

## 2011-10-05 MED ORDER — INSULIN GLARGINE 100 UNIT/ML ~~LOC~~ SOLN
20.0000 [IU] | Freq: Once | SUBCUTANEOUS | Status: AC
  Administered 2011-10-05: 20 [IU] via SUBCUTANEOUS
  Filled 2011-10-05: qty 3

## 2011-10-05 MED ORDER — NICOTINE 21 MG/24HR TD PT24
21.0000 mg | MEDICATED_PATCH | Freq: Every day | TRANSDERMAL | Status: DC
  Administered 2011-10-05 – 2011-10-07 (×3): 21 mg via TRANSDERMAL
  Filled 2011-10-05 (×3): qty 1

## 2011-10-05 MED ORDER — METHYLPREDNISOLONE SODIUM SUCC 125 MG IJ SOLR
60.0000 mg | Freq: Three times a day (TID) | INTRAMUSCULAR | Status: DC
  Administered 2011-10-05 – 2011-10-06 (×3): 60 mg via INTRAVENOUS
  Filled 2011-10-05 (×6): qty 0.96

## 2011-10-05 NOTE — Progress Notes (Signed)
Subjective: Patient seen and examined,feeling better today    Objective: Vital signs in last 24 hours: Temp:  [97.8 F (36.6 C)-99 F (37.2 C)] 97.8 F (36.6 C) (01/12 0505) Pulse Rate:  [69-105] 69  (01/12 0505) Resp:  [16-22] 18  (01/12 0505) BP: (95-139)/(55-84) 95/58 mmHg (01/12 0505) SpO2:  [92 %-99 %] 97 % (01/12 0733) Weight:  [74.1 kg (163 lb 5.8 oz)-75.751 kg (167 lb)] 74.7 kg (164 lb 10.9 oz) (01/12 0500) Weight change:  Last BM Date: 10/02/11  Intake/Output from previous day: 01/11 0701 - 01/12 0700 In: 1232.3 [I.V.:1232.3] Out: 700 [Urine:700] Total I/O In: 480 [P.O.:480] Out: 150 [Urine:150]   Physical Exam: General: Alert, awake, oriented x3, in no acute distress. Heart: scattered rhonchi and expiratory wheezes Lungs: Clear to auscultation bilaterally. Abdomen: Soft, nontender, nondistended, positive bowel sounds. Extremities: No clubbing cyanosis or edema with positive pedal pulses. Neuro: Grossly intact, nonfocal.    Lab Results: Results for orders placed during the hospital encounter of 10/03/11 (from the past 24 hour(s))  GLUCOSE, CAPILLARY     Status: Abnormal   Collection Time   10/04/11 12:41 PM      Component Value Range   Glucose-Capillary 486 (*) 70 - 99 (mg/dL)  GLUCOSE, CAPILLARY     Status: Abnormal   Collection Time   10/04/11  3:00 PM      Component Value Range   Glucose-Capillary 514 (*) 70 - 99 (mg/dL)  GLUCOSE, CAPILLARY     Status: Abnormal   Collection Time   10/04/11  4:28 PM      Component Value Range   Glucose-Capillary 437 (*) 70 - 99 (mg/dL)  GLUCOSE, CAPILLARY     Status: Abnormal   Collection Time   10/04/11  5:29 PM      Component Value Range   Glucose-Capillary 490 (*) 70 - 99 (mg/dL)  GLUCOSE, CAPILLARY     Status: Abnormal   Collection Time   10/04/11  6:47 PM      Component Value Range   Glucose-Capillary 402 (*) 70 - 99 (mg/dL)  GLUCOSE, CAPILLARY     Status: Abnormal   Collection Time   10/04/11  8:21 PM   Component Value Range   Glucose-Capillary 428 (*) 70 - 99 (mg/dL)   Comment 1 Notify RN     Comment 2 Documented in Chart    GLUCOSE, CAPILLARY     Status: Abnormal   Collection Time   10/04/11  9:39 PM      Component Value Range   Glucose-Capillary 410 (*) 70 - 99 (mg/dL)   Comment 1 Notify RN     Comment 2 Documented in Chart    GLUCOSE, CAPILLARY     Status: Abnormal   Collection Time   10/04/11 10:32 PM      Component Value Range   Glucose-Capillary 360 (*) 70 - 99 (mg/dL)   Comment 1 Notify RN     Comment 2 Documented in Chart    GLUCOSE, CAPILLARY     Status: Abnormal   Collection Time   10/04/11 11:38 PM      Component Value Range   Glucose-Capillary 232 (*) 70 - 99 (mg/dL)   Comment 1 Notify RN    GLUCOSE, CAPILLARY     Status: Abnormal   Collection Time   10/05/11 12:34 AM      Component Value Range   Glucose-Capillary 168 (*) 70 - 99 (mg/dL)   Comment 1 Documented in Chart  Comment 2 Notify RN    BASIC METABOLIC PANEL     Status: Abnormal   Collection Time   10/05/11 12:40 AM      Component Value Range   Sodium 136  135 - 145 (mEq/L)   Potassium 3.5  3.5 - 5.1 (mEq/L)   Chloride 101  96 - 112 (mEq/L)   CO2 24  19 - 32 (mEq/L)   Glucose, Bld 146 (*) 70 - 99 (mg/dL)   BUN 23  6 - 23 (mg/dL)   Creatinine, Ser 4.09  0.50 - 1.10 (mg/dL)   Calcium 9.4  8.4 - 81.1 (mg/dL)   GFR calc non Af Amer >90  >90 (mL/min)   GFR calc Af Amer >90  >90 (mL/min)  CBC     Status: Abnormal   Collection Time   10/05/11 12:40 AM      Component Value Range   WBC 8.7  4.0 - 10.5 (K/uL)   RBC 4.31  3.87 - 5.11 (MIL/uL)   Hemoglobin 12.5  12.0 - 15.0 (g/dL)   HCT 91.4 (*) 78.2 - 46.0 (%)   MCV 83.1  78.0 - 100.0 (fL)   MCH 29.0  26.0 - 34.0 (pg)   MCHC 34.9  30.0 - 36.0 (g/dL)   RDW 95.6  21.3 - 08.6 (%)   Platelets 203  150 - 400 (K/uL)  GLUCOSE, CAPILLARY     Status: Abnormal   Collection Time   10/05/11  1:36 AM      Component Value Range   Glucose-Capillary 142 (*) 70 - 99  (mg/dL)   Comment 1 Notify RN     Comment 2 Documented in Chart    GLUCOSE, CAPILLARY     Status: Abnormal   Collection Time   10/05/11  2:32 AM      Component Value Range   Glucose-Capillary 121 (*) 70 - 99 (mg/dL)  GLUCOSE, CAPILLARY     Status: Abnormal   Collection Time   10/05/11  3:30 AM      Component Value Range   Glucose-Capillary 132 (*) 70 - 99 (mg/dL)   Comment 1 Notify RN     Comment 2 Documented in Chart    GLUCOSE, CAPILLARY     Status: Abnormal   Collection Time   10/05/11  4:28 AM      Component Value Range   Glucose-Capillary 122 (*) 70 - 99 (mg/dL)   Comment 1 Notify RN     Comment 2 Documented in Chart      Studies/Results: Dg Chest 2 View  10/03/2011  *RADIOLOGY REPORT*  Clinical Data: Chest pain and cough.  CHEST - 2 VIEW  Comparison: Chest x-ray 07/21/2011.  Findings: The cardiac silhouette, mediastinal and hilar contours are within normal limits and stable.  There are chronic lung changes but no acute pulmonary findings.  Remote healed rib fractures are noted.  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  IMPRESSION: Chronic lung changes but no acute pulmonary findings.  Original Report Authenticated By: P. Loralie Champagne, M.D.    Medications:    . albuterol  2.5 mg Nebulization Q6H  . buPROPion  150 mg Oral BID  . enalapril  20 mg Oral Daily  . enoxaparin  40 mg Subcutaneous Q24H  . FLUoxetine  40 mg Oral Daily  . insulin aspart  0-20 Units Subcutaneous TID WC  . insulin aspart  0-5 Units Subcutaneous QHS  . insulin aspart  6 Units Subcutaneous TID WC  .  insulin glargine  20 Units Subcutaneous QHS  . insulin glargine  20 Units Subcutaneous Once  . insulin regular  0-10 Units Intravenous TID WC  . methylPREDNISolone (SOLU-MEDROL) injection  60 mg Intravenous Q4H  . metoCLOPramide  10 mg Oral TID AC  . moxifloxacin  400 mg Intravenous Q24H  . traZODone  100 mg Oral QHS  . DISCONTD: insulin aspart  25 Units  Subcutaneous Once    acetaminophen, acetaminophen, albuterol, albuterol, ALPRAZolam, dextrose, HYDROcodone-acetaminophen, HYDROmorphone, sodium chloride     . sodium chloride 100 mL/hr at 10/05/11 0340  . insulin (NOVOLIN-R) infusion Stopped (10/05/11 0435)  . DISCONTD: sodium chloride 100 mL/hr at 10/04/11 0844  . DISCONTD: dextrose 5 % and 0.45% NaCl    . DISCONTD: insulin (NOVOLIN-R) infusion 4.3 Units/hr (10/04/11 1738)    Assessment/Plan:  Principal Problem:  *COPD exacerbation Improving  Continue nebs and Avelox ,taper solumedrol down . Active Problems: DM: Uncontrolled ,probably worsened by steroids .currently off insulin drip ,continue lantus insulin ,novolog meal coverage and resistant insulin scale   Tobacco abuse counseled ,start nictoine patch  HTN: Controlled on enalapril Depression/anxiety dx: Continue meds      LOS: 2 days   [name] 10/05/2011, 10:24 AM

## 2011-10-06 LAB — GLUCOSE, CAPILLARY: Glucose-Capillary: 483 mg/dL — ABNORMAL HIGH (ref 70–99)

## 2011-10-06 MED ORDER — INSULIN GLARGINE 100 UNIT/ML ~~LOC~~ SOLN
25.0000 [IU] | Freq: Every day | SUBCUTANEOUS | Status: DC
  Administered 2011-10-06: 25 [IU] via SUBCUTANEOUS

## 2011-10-06 MED ORDER — MENTHOL 3 MG MT LOZG: 1.0000 | LOZENGE | OROMUCOSAL | Status: DC | PRN

## 2011-10-06 MED ORDER — INSULIN ASPART 100 UNIT/ML ~~LOC~~ SOLN
10.0000 [IU] | Freq: Three times a day (TID) | SUBCUTANEOUS | Status: DC
  Administered 2011-10-06 – 2011-10-07 (×3): 10 [IU] via SUBCUTANEOUS

## 2011-10-06 MED ORDER — MOXIFLOXACIN HCL 400 MG PO TABS
400.0000 mg | ORAL_TABLET | Freq: Every day | ORAL | Status: DC
  Administered 2011-10-07: 400 mg via ORAL
  Filled 2011-10-06 (×3): qty 1

## 2011-10-06 MED ORDER — METHYLPREDNISOLONE SODIUM SUCC 125 MG IJ SOLR
60.0000 mg | Freq: Two times a day (BID) | INTRAMUSCULAR | Status: DC
  Administered 2011-10-06 – 2011-10-07 (×2): 60 mg via INTRAVENOUS
  Filled 2011-10-06 (×3): qty 0.96

## 2011-10-06 NOTE — Progress Notes (Signed)
Dr. Cleotis Lema notified of CBG this am.  New orders to give scheduled SSI 20 Units and NOT to obtain a serum glucose reading.

## 2011-10-06 NOTE — Progress Notes (Signed)
Subjective: Patient seen and examined ,still coughing but breathing better   Objective: Vital signs in last 24 hours: Temp:  [97.9 F (36.6 C)-98.6 F (37 C)] 98.2 F (36.8 C) (01/13 0527) Pulse Rate:  [81-87] 81  (01/13 0527) Resp:  [18-24] 18  (01/13 0527) BP: (104-125)/(67-79) 118/72 mmHg (01/13 0527) SpO2:  [93 %-98 %] 98 % (01/13 0915) Weight:  [77.5 kg (170 lb 13.7 oz)] 77.5 kg (170 lb 13.7 oz) (01/13 0500) Weight change: 1.749 kg (3 lb 13.7 oz) Last BM Date: 10/02/11  Intake/Output from previous day: 01/12 0701 - 01/13 0700 In: 3150 [P.O.:1800; I.V.:1100; IV Piggyback:250] Out: 1600 [Urine:1600] Total I/O In: 120 [P.O.:120] Out: -    Physical Exam: General: Alert, awake, oriented x3, in no acute distress. Heart: Regular rate and rhythm, without murmurs, rubs, gallops. Lungs: Clear to auscultation bilaterally. Abdomen: Soft, nontender, nondistended, positive bowel sounds. Extremities: No clubbing cyanosis or edema with positive pedal pulses. Neuro: Grossly intact, nonfocal.    Lab Results: Results for orders placed during the hospital encounter of 10/03/11 (from the past 24 hour(s))  GLUCOSE, CAPILLARY     Status: Abnormal   Collection Time   10/05/11  5:12 PM      Component Value Range   Glucose-Capillary 375 (*) 70 - 99 (mg/dL)  GLUCOSE, CAPILLARY     Status: Abnormal   Collection Time   10/05/11  8:31 PM      Component Value Range   Glucose-Capillary 190 (*) 70 - 99 (mg/dL)  GLUCOSE, CAPILLARY     Status: Abnormal   Collection Time   10/06/11  7:24 AM      Component Value Range   Glucose-Capillary 483 (*) 70 - 99 (mg/dL)  GLUCOSE, CAPILLARY     Status: Abnormal   Collection Time   10/06/11 11:38 AM      Component Value Range   Glucose-Capillary 343 (*) 70 - 99 (mg/dL)    Studies/Results: No results found.  Medications:    . albuterol  2.5 mg Nebulization Q6H  . buPROPion  150 mg Oral BID  . enalapril  20 mg Oral Daily  . enoxaparin  40 mg  Subcutaneous Q24H  . FLUoxetine  40 mg Oral Daily  . insulin aspart  0-20 Units Subcutaneous TID WC  . insulin aspart  0-5 Units Subcutaneous QHS  . insulin aspart  6 Units Subcutaneous TID WC  . insulin glargine  20 Units Subcutaneous QHS  . methylPREDNISolone (SOLU-MEDROL) injection  60 mg Intravenous Q8H  . metoCLOPramide  10 mg Oral TID AC  . moxifloxacin  400 mg Intravenous Q24H  . nicotine  21 mg Transdermal Daily  . traZODone  100 mg Oral QHS    acetaminophen, acetaminophen, albuterol, albuterol, ALPRAZolam, benzonatate, dextrose, guaiFENesin-dextromethorphan, HYDROcodone-acetaminophen, HYDROmorphone, sodium chloride     . sodium chloride 100 mL/hr at 10/06/11 0152    Assessment/Plan:  Principal Problem:  *COPD exacerbation  Improving  Continue nebs and Avelox ,taper solumedrol down .  Active Problems:  DM:  Uncontrolled ,probably worsened by steroids .increase lantus insulin to 25 ,increase novolog meal coverage  To 10 units and continue  insulin scale  Tobacco abuse  counseled ,start nictoine patch  HTN:  Controlled on enalapril  Depression/anxiety dx:  Continue meds     LOS: 3 days   Safal Halderman 10/06/2011, 12:40 PM

## 2011-10-06 NOTE — Progress Notes (Signed)
PHARMACIST - PHYSICIAN COMMUNICATION DR:   Cleotis Lema CONCERNING: Antibiotic IV to Oral Route Change Policy  RECOMMENDATION: This patient is receiving Avelox by the intravenous route.  Based on criteria approved by the Pharmacy and Therapeutics Committee, the antibiotic(s) is/are being converted to the equivalent oral dose form(s).   DESCRIPTION: These criteria include:  Patient being treated for a respiratory tract infection, urinary tract infection, or cellulitis  The patient is not neutropenic and does not exhibit a GI malabsorption state  The patient is eating (either orally or via tube) and/or has been taking other orally administered medications for a least 24 hours  The patient is improving clinically and has a Tmax < 100.5  If you have questions about this conversion, please contact the Pharmacy Department  []   [phone] )  [facility] []   [phone] )  [facility]  []   [phone] )  [facility] [x]   [phone] )  [facility]

## 2011-10-07 LAB — GLUCOSE, CAPILLARY

## 2011-10-07 LAB — CBC
Hemoglobin: 11.8 g/dL — ABNORMAL LOW (ref 12.0–15.0)
MCH: 28.8 pg (ref 26.0–34.0)
MCHC: 34.1 g/dL (ref 30.0–36.0)
MCV: 84.4 fL (ref 78.0–100.0)
Platelets: 208 10*3/uL (ref 150–400)
RBC: 4.1 MIL/uL (ref 3.87–5.11)

## 2011-10-07 LAB — BASIC METABOLIC PANEL
CO2: 24 mEq/L (ref 19–32)
Calcium: 8.3 mg/dL — ABNORMAL LOW (ref 8.4–10.5)
Creatinine, Ser: 0.67 mg/dL (ref 0.50–1.10)
GFR calc non Af Amer: >90 mL/min (ref >90)
Glucose, Bld: 249 mg/dL — ABNORMAL HIGH (ref 70–99)

## 2011-10-07 MED ORDER — ALBUTEROL 90 MCG/ACT IN AERS: 2.0000 | INHALATION_SPRAY | RESPIRATORY_TRACT | Status: DC | PRN

## 2011-10-07 MED ORDER — INSULIN GLARGINE 100 UNIT/ML ~~LOC~~ SOLN: 30.0000 [IU] | Freq: Every day | SUBCUTANEOUS | Status: DC

## 2011-10-07 MED ORDER — MOXIFLOXACIN HCL 400 MG PO TABS: 400.0000 mg | ORAL_TABLET | Freq: Every day | ORAL | Status: AC

## 2011-10-07 MED ORDER — PREDNISONE 10 MG PO TABS: ORAL_TABLET | ORAL | Status: DC

## 2011-10-07 MED ORDER — INSULIN ASPART 100 UNIT/ML ~~LOC~~ SOLN: 10.0000 [IU] | Freq: Three times a day (TID) | SUBCUTANEOUS | Status: DC

## 2011-10-07 MED ORDER — BENZONATATE 100 MG PO CAPS: 100.0000 mg | ORAL_CAPSULE | Freq: Three times a day (TID) | ORAL | Status: AC | PRN

## 2011-10-07 MED ORDER — GUAIFENESIN-DM 100-10 MG/5ML PO SYRP: 5.0000 mL | ORAL_SOLUTION | ORAL | Status: AC | PRN

## 2011-10-07 NOTE — Progress Notes (Signed)
   CARE MANAGEMENT NOTE 10/07/2011  Patient:  [name], [name]   Account Number:  [id number]  Date Initiated:  10/07/2011  Documentation initiated by:  [facility]  Subjective/Objective Assessment:   ADMITTED W/SOB. HX: COPD.     Action/Plan:   FROM HOME W/FAMILY,HAS PCP,PHARMACY.HAS USED [facility] IN PAST FOR DME-RW,3N1,HOSP BED,W/C   Anticipated DC Date:  10/07/2011   Anticipated DC Plan:  HOME/SELF CARE         Choice offered to / List presented to:             Status of service:  In process, will continue to follow Medicare Important Message given?   (If response is "NO", the following Medicare IM given date fields will be blank) Date Medicare IM given:   Date Additional Medicare IM given:    Discharge Disposition:  HOME/SELF CARE  Per UR Regulation:  Reviewed for med. necessity/level of care/duration of stay  Comments:  10/07/11 Molly Maselli RN,BSN NCM [phone] NOTED ON 02,MD ORDERED TO CHECK SATS ON RA.IF QUALIFY CAN ORDER HOME 02.

## 2011-10-07 NOTE — Discharge Instructions (Signed)

## 2011-10-07 NOTE — Progress Notes (Signed)
Patient ambulated approximately 20 feet on room air.  Oxygen saturation stayed between 94-96%.  Dr. Cleotis Lema notified.

## 2011-10-07 NOTE — Discharge Summary (Signed)
Patient ID: [name] MRN: [id number] DOB/AGE: January 10, 1961 51 y.o.  Admit date: 10/03/2011 Discharge date: 10/07/2011  Primary Care Physician:  No primary provider on file.   Discharge Diagnoses:    Present on Admission:  .COPD exacerbation .Tobacco abuse Uncontrolled diabetes mellitus  Current Discharge Medication List    START taking these medications   Details  albuterol (PROVENTIL,VENTOLIN) 90 MCG/ACT inhaler Inhale 2 puffs into the lungs every 4 (four) hours as needed for wheezing. Qty: 17 g, Refills: 12    benzonatate (TESSALON) 100 MG capsule Take 1 capsule (100 mg total) by mouth 3 (three) times daily as needed for cough. Qty: 20 capsule, Refills: 0    guaiFENesin-dextromethorphan (ROBITUSSIN DM) 100-10 MG/5ML syrup Take 5 mLs by mouth every 4 (four) hours as needed for cough. Qty: 120 mL, Refills: 0    moxifloxacin (AVELOX) 400 MG tablet Take 1 tablet (400 mg total) by mouth daily at 6 (six) AM. Qty: 3 tablet, Refills: 0    predniSONE (DELTASONE) 10 MG tablet Take 6 tablets daily  for 3 days then 4 tablets daily for 3 days ,then 2 tablets daily for 3 days then 1 tablet daily for 3 days Qty: 39 tablet, Refills: 0      CONTINUE these medications which have CHANGED   Details  insulin aspart (NOVOLOG) 100 UNIT/ML injection Inject 10 Units into the skin 3 (three) times daily with meals. Qty: 1 vial, Refills: 1    insulin glargine (LANTUS) 100 UNIT/ML injection Inject 30 Units into the skin at bedtime. Qty: 10 mL, Refills: 1      CONTINUE these medications which have NOT CHANGED   Details  ALPRAZolam (XANAX) 0.5 MG tablet Take 1 tablet (0.5 mg total) by mouth 2 (two) times daily as needed. For anxiety Qty: 20 tablet, Refills: 0    buPROPion (WELLBUTRIN) 75 MG tablet Take 2 tablets (150 mg total) by mouth 2 (two) times daily. Qty: 30 tablet, Refills: 0    enalapril (VASOTEC) 20 MG tablet Take 20 mg by mouth daily.     FLUoxetine (PROZAC) 40 MG capsule Take  1 capsule (40 mg total) by mouth daily. Qty: 20 capsule, Refills: 0    HYDROcodone-acetaminophen (VICODIN) 5-500 MG per tablet Take 1 tablet by mouth daily as needed. For pain Qty: 15 tablet, Refills: 0    metoCLOPramide (REGLAN) 10 MG tablet Take 10 mg by mouth 3 (three) times daily before meals.     traZODone (DESYREL) 100 MG tablet Take 100 mg by mouth at bedtime.          Consults:  None   Significant Diagnostic Studies:  Dg Chest 2 View  10/03/2011  *RADIOLOGY REPORT*  Clinical Data: Chest pain and cough.  CHEST - 2 VIEW  Comparison: Chest x-ray 07/21/2011.  Findings: The cardiac silhouette, mediastinal and hilar contours are within normal limits and stable.  There are chronic lung changes but no acute pulmonary findings.  Remote healed rib fractures are noted.  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  IMPRESSION: Chronic lung changes but no acute pulmonary findings.  Original Report Authenticated By: P. Loralie Champagne, M.D.    Brief H and P: For complete details please refer to admission H and P, but in brief  [name] is an 51 y.o. female with history of diabetes, anemia, hypertension, chronic low back pain, depression, hypercholesterolemia, and unfortunately active tobacco use, presents to the emergency room with wheezing, nonproductive cough, shortness of  breath, muscle aches for 1 day, and low-grade fever. She has no distant travel or any ill contact. She denied any chest pain. She was never given a diagnosis of emphysema, however she has been a long-time smoker. Evaluation in emergency room included a chest x-ray which showed chronic changes but no acute infiltrate, a urinalysis with many epithelial cells, negative cardiac markers, and a normal white count with hemoglobin of 16 g per decaliter. After she was given nebulizers and IV steroids, hospitalist was asked to admit her for COPD exacerbation.      Hospital Course:    Patient was admitted to the hospital, chest x-ray showed no evidence of pneumonia. she was treated with albuterol nebulizer, Solu-Medrol, Avelox. She was also given Tessalon capsules and Robitussin-DM when necessary for cough. Patient's symptoms significantly improved, she is still coughing however her lung exam is currently clear. Blood glucose noted to be elevated, which is probably worsened by steroids, insulin dose was adjusted as above . Patient is counseled on  smoking cessation. Patient seen and examined by me today she is stable for discharge to home to follow with PCP tomorrow as scheduled. Will check oxygen saturation off oxygen with exertion to see if she will qualify for home oxygen before discharge.  Filed Vitals:   10/07/11 0629  BP: 165/90  Pulse: 84  Temp: 97.9 F (36.6 C)  Resp: 20    General: Alert, awake, oriented x3, in no acute distress. HEENT: No bruits, no goiter. Heart: Regular rate and rhythm, without murmurs, rubs, gallops. Lungs: Clear to auscultation bilaterally. Abdomen: Soft, nontender, nondistended, positive bowel sounds. Extremities: No clubbing cyanosis or edema with positive pedal pulses. Neuro: Grossly intact, nonfocal.   Disposition and Follow-up:  To home Follow his PCP tomorrow as scheduled   Time spent on Discharge: 45 minutes   Signed: Rosamaria Donn 10/07/2011, 11:06 AM

## 2011-10-15 NOTE — Discharge Summary (Signed)
  Identifying information: This is a 51 year old female, this is a voluntary admission.  Date of admission: 08/09/2011. Date of discharge: 08/11/2011.  Discharge medications: Abilify 5 mg by mouth each bedtime, for clear thoughts. Wellbutrin 150 mg twice daily, for depression. Fluoxetine 40 mg daily, for depression. Lantus insulin 20 units each bedtime. Hydrocodone 5/325 mg every 6 hours when necessary for pain Alprazolam 0.5 mg twice daily, for anxiety. Vasotec 20 mg daily, for blood pressure. Reglan 10 mg 3 times a day, for gastroparesis. Trazodone 100 mg by mouth each bedtime, for insomnia.  Discharge diagnoses: Axis I: Maj. depression recurrent severe, generalized anxiety disorder. Axis II: No diagnosis. Axis III: Diabetes uncontrolled, Chronic back pain. Hypertension Axis IV: Deferred Axis V: Current 40 past year not known.   Discharge disposition: Patient is transferred to the emergency room for stabilization, and subsequent admission to the medical unit for stabilization of her diabetes.  Course of hospitalization: This was a brief one day stay for [facility], who had returned to our unit after hospitalization on our medical unit for hyperglycemia. She was readmitted on Lantus 20 units each bedtime,  And she was started on Abilify 5 mg to control agitation.  She tolerated the Abilify well. While on our unit her capillary blood sugars continue to be over 400 and she was transferred back to the medical unit for admission, where psychiatry continued to follow her.
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
## 2012-01-02 ENCOUNTER — Other Ambulatory Visit: Payer: Self-pay | Admitting: Orthopedic Surgery

## 2012-01-02 DIAGNOSIS — G959 Disease of spinal cord, unspecified: Secondary | ICD-10-CM

## 2012-01-10 ENCOUNTER — Other Ambulatory Visit: Payer: Medicaid Other

## 2012-01-20 ENCOUNTER — Ambulatory Visit
Admission: RE | Admit: 2012-01-20 | Discharge: 2012-01-20 | Disposition: A | Discharge status: Home / Self Care | Payer: Medicaid Other | Source: Ambulatory Visit | Attending: Orthopedic Surgery | Admitting: Orthopedic Surgery

## 2012-01-20 VITALS — BP 112/59 | HR 66

## 2012-01-20 DIAGNOSIS — G959 Disease of spinal cord, unspecified: Secondary | ICD-10-CM

## 2012-01-20 MED ORDER — IOHEXOL 300 MG/ML  SOLN
10.0000 mL | Freq: Once | INTRAMUSCULAR | Status: AC | PRN
  Administered 2012-01-20: 10 mL via INTRATHECAL

## 2012-01-20 MED ORDER — ONDANSETRON HCL 4 MG/2ML IJ SOLN
4.0000 mg | Freq: Once | INTRAMUSCULAR | Status: AC
  Administered 2012-01-20: 4 mg via INTRAMUSCULAR

## 2012-01-20 MED ORDER — MEPERIDINE HCL 100 MG/ML IJ SOLN
75.0000 mg | Freq: Once | INTRAMUSCULAR | Status: AC
  Administered 2012-01-20: 75 mg via INTRAMUSCULAR

## 2012-01-20 MED ORDER — DIAZEPAM 5 MG PO TABS
10.0000 mg | ORAL_TABLET | Freq: Once | ORAL | Status: AC
  Administered 2012-01-20: 10 mg via ORAL

## 2012-01-20 NOTE — Progress Notes (Signed)
Patient states she has been off Abilify and Prozac for the past two days.  jkl

## 2012-01-20 NOTE — Discharge Instructions (Signed)
Myelogram Discharge Instructions  1. Go home and rest quietly for the next 24 hours.  It is important to lie flat for the next 24 hours.  Get up only to go to the restroom.  You may lie in the bed or on a couch on your back, your stomach, your left side or your right side.  You may have one pillow under your head.  You may have pillows between your knees while you are on your side or under your knees while you are on your back.  2. DO NOT drive today.  Recline the seat as far back as it will go, while still wearing your seat belt, on the way home.  3. You may get up to go to the bathroom as needed.  You may sit up for 10 minutes to eat.  You may resume your normal diet and medications unless otherwise indicated.  Drink plenty of extra fluids today and tomorrow.  4. The incidence of a spinal headache with nausea and/or vomiting is about 5% (one in 20 patients).  If you develop a headache, lie flat and drink plenty of fluids until the headache goes away.  Caffeinated beverages may be helpful.  If you develop severe nausea and vomiting or a headache that does not go away with flat bed rest, call [phone].  5. You may resume normal activities after your 24 hours of bed rest is over; however, do not exert yourself strongly or do any heavy lifting tomorrow.  6. Call your physician for a follow-up appointment.    You may resume Prozac and Abilify on Tuesday, January 21, 2012 after 9:30a.m.

## 2012-01-20 NOTE — Progress Notes (Signed)
Patient checked her blood sugar.  Stated it was 252.  "I'm not going to worry about it until it reaches 300;" no insulin used by patient.  jkl

## 2012-03-16 ENCOUNTER — Other Ambulatory Visit (HOSPITAL_COMMUNITY): Payer: Self-pay | Admitting: Internal Medicine

## 2012-03-17 ENCOUNTER — Other Ambulatory Visit (HOSPITAL_COMMUNITY): Payer: Self-pay | Admitting: Internal Medicine

## 2012-04-01 ENCOUNTER — Other Ambulatory Visit: Payer: Self-pay | Admitting: Orthopedic Surgery

## 2012-04-01 DIAGNOSIS — G959 Disease of spinal cord, unspecified: Secondary | ICD-10-CM

## 2012-04-10 ENCOUNTER — Other Ambulatory Visit: Payer: Medicaid Other

## 2012-04-28 ENCOUNTER — Ambulatory Visit
Admission: RE | Admit: 2012-04-28 | Discharge: 2012-04-28 | Disposition: A | Discharge status: Home / Self Care | Payer: Medicaid Other | Source: Ambulatory Visit | Attending: Orthopedic Surgery | Admitting: Orthopedic Surgery

## 2012-04-28 DIAGNOSIS — G959 Disease of spinal cord, unspecified: Secondary | ICD-10-CM

## 2012-07-06 ENCOUNTER — Observation Stay (HOSPITAL_COMMUNITY)
Admission: EM | Admit: 2012-07-06 | Discharge: 2012-07-07 | Disposition: A | Discharge status: Home / Self Care | Payer: Medicaid Other | Attending: Emergency Medicine | Admitting: Emergency Medicine

## 2012-07-06 ENCOUNTER — Encounter (HOSPITAL_COMMUNITY): Payer: Self-pay

## 2012-07-06 ENCOUNTER — Emergency Department (HOSPITAL_COMMUNITY): Payer: Medicaid Other

## 2012-07-06 DIAGNOSIS — E7800 Pure hypercholesterolemia, unspecified: Secondary | ICD-10-CM | POA: Insufficient documentation

## 2012-07-06 DIAGNOSIS — M545 Low back pain, unspecified: Secondary | ICD-10-CM | POA: Insufficient documentation

## 2012-07-06 DIAGNOSIS — F411 Generalized anxiety disorder: Secondary | ICD-10-CM | POA: Insufficient documentation

## 2012-07-06 DIAGNOSIS — F17200 Nicotine dependence, unspecified, uncomplicated: Secondary | ICD-10-CM | POA: Insufficient documentation

## 2012-07-06 DIAGNOSIS — R0602 Shortness of breath: Secondary | ICD-10-CM | POA: Insufficient documentation

## 2012-07-06 DIAGNOSIS — F329 Major depressive disorder, single episode, unspecified: Secondary | ICD-10-CM | POA: Insufficient documentation

## 2012-07-06 DIAGNOSIS — J40 Bronchitis, not specified as acute or chronic: Secondary | ICD-10-CM

## 2012-07-06 DIAGNOSIS — M25519 Pain in unspecified shoulder: Secondary | ICD-10-CM | POA: Insufficient documentation

## 2012-07-06 DIAGNOSIS — R079 Chest pain, unspecified: Principal | ICD-10-CM | POA: Insufficient documentation

## 2012-07-06 DIAGNOSIS — F4001 Agoraphobia with panic disorder: Secondary | ICD-10-CM | POA: Insufficient documentation

## 2012-07-06 DIAGNOSIS — E1142 Type 2 diabetes mellitus with diabetic polyneuropathy: Secondary | ICD-10-CM | POA: Insufficient documentation

## 2012-07-06 DIAGNOSIS — I10 Essential (primary) hypertension: Secondary | ICD-10-CM | POA: Insufficient documentation

## 2012-07-06 DIAGNOSIS — R739 Hyperglycemia, unspecified: Secondary | ICD-10-CM

## 2012-07-06 DIAGNOSIS — R110 Nausea: Secondary | ICD-10-CM | POA: Insufficient documentation

## 2012-07-06 DIAGNOSIS — F3289 Other specified depressive episodes: Secondary | ICD-10-CM | POA: Insufficient documentation

## 2012-07-06 DIAGNOSIS — E1149 Type 2 diabetes mellitus with other diabetic neurological complication: Secondary | ICD-10-CM | POA: Insufficient documentation

## 2012-07-06 DIAGNOSIS — G8929 Other chronic pain: Secondary | ICD-10-CM | POA: Insufficient documentation

## 2012-07-06 DIAGNOSIS — D649 Anemia, unspecified: Secondary | ICD-10-CM | POA: Insufficient documentation

## 2012-07-06 HISTORY — DX: Tobacco use: Z72.0

## 2012-07-06 LAB — URINALYSIS, ROUTINE W REFLEX MICROSCOPIC
Bilirubin Urine: NEGATIVE
Glucose, UA: NEGATIVE mg/dL
Hgb urine dipstick: NEGATIVE
Ketones, ur: NEGATIVE mg/dL
Leukocytes, UA: NEGATIVE
Protein, ur: NEGATIVE mg/dL
pH: 7 (ref 5.0–8.0)

## 2012-07-06 LAB — CBC
HCT: 41.1 % (ref 36.0–46.0)
MCHC: 34.3 g/dL (ref 30.0–36.0)
RDW: 13.1 % (ref 11.5–15.5)
WBC: 13.3 10*3/uL — ABNORMAL HIGH (ref 4.0–10.5)

## 2012-07-06 LAB — COMPREHENSIVE METABOLIC PANEL
ALT: 9 U/L (ref 0–35)
AST: 12 U/L (ref 0–37)
Albumin: 3.5 g/dL (ref 3.5–5.2)
Alkaline Phosphatase: 94 U/L (ref 39–117)
Chloride: 100 mEq/L (ref 96–112)
Potassium: 3.4 mEq/L — ABNORMAL LOW (ref 3.5–5.1)
Sodium: 138 mEq/L (ref 135–145)
Total Bilirubin: 0.5 mg/dL (ref 0.3–1.2)
Total Protein: 6.4 g/dL (ref 6.0–8.3)

## 2012-07-06 LAB — GLUCOSE, CAPILLARY
Glucose-Capillary: 187 mg/dL — ABNORMAL HIGH (ref 70–99)
Glucose-Capillary: 434 mg/dL — ABNORMAL HIGH (ref 70–99)

## 2012-07-06 LAB — PRO B NATRIURETIC PEPTIDE: Pro B Natriuretic peptide (BNP): 247.4 pg/mL — ABNORMAL HIGH (ref 0–125)

## 2012-07-06 LAB — D-DIMER, QUANTITATIVE: D-Dimer, Quant: 0.42 ug/mL-FEU (ref 0.00–0.48)

## 2012-07-06 LAB — POCT I-STAT TROPONIN I: Troponin i, poc: 0 ng/mL (ref 0.00–0.08)

## 2012-07-06 MED ORDER — ONDANSETRON HCL 4 MG/2ML IJ SOLN
4.0000 mg | Freq: Once | INTRAMUSCULAR | Status: AC
  Administered 2012-07-06: 4 mg via INTRAVENOUS
  Filled 2012-07-06: qty 2

## 2012-07-06 MED ORDER — ASPIRIN 81 MG PO CHEW
324.0000 mg | CHEWABLE_TABLET | Freq: Once | ORAL | Status: AC
  Administered 2012-07-06: 324 mg via ORAL
  Filled 2012-07-06: qty 4

## 2012-07-06 MED ORDER — ARIPIPRAZOLE 5 MG PO TABS
5.0000 mg | ORAL_TABLET | Freq: Every day | ORAL | Status: DC
  Administered 2012-07-06 – 2012-07-07 (×2): 5 mg via ORAL
  Filled 2012-07-06 (×2): qty 1

## 2012-07-06 MED ORDER — OXYCODONE-ACETAMINOPHEN 5-325 MG PO TABS
1.0000 | ORAL_TABLET | Freq: Four times a day (QID) | ORAL | Status: DC | PRN
  Administered 2012-07-06: 1 via ORAL
  Filled 2012-07-06: qty 1

## 2012-07-06 MED ORDER — BUPROPION HCL ER (SR) 150 MG PO TB12
150.0000 mg | ORAL_TABLET | Freq: Two times a day (BID) | ORAL | Status: DC
  Administered 2012-07-06: 150 mg via ORAL
  Administered 2012-07-07: 100 mg via ORAL
  Filled 2012-07-06 (×2): qty 1

## 2012-07-06 MED ORDER — TRAZODONE HCL 50 MG PO TABS
100.0000 mg | ORAL_TABLET | Freq: Every day | ORAL | Status: DC
  Administered 2012-07-06: 100 mg via ORAL
  Filled 2012-07-06: qty 2

## 2012-07-06 MED ORDER — HYDROMORPHONE HCL PF 1 MG/ML IJ SOLN
1.0000 mg | Freq: Once | INTRAMUSCULAR | Status: AC
  Administered 2012-07-06: 1 mg via INTRAVENOUS
  Filled 2012-07-06: qty 1

## 2012-07-06 MED ORDER — INSULIN GLARGINE 100 UNIT/ML ~~LOC~~ SOLN
20.0000 [IU] | Freq: Every day | SUBCUTANEOUS | Status: DC
  Administered 2012-07-06: 20 [IU] via SUBCUTANEOUS

## 2012-07-06 MED ORDER — SODIUM CHLORIDE 0.9 % IV SOLN
1000.0000 mL | INTRAVENOUS | Status: DC
  Administered 2012-07-07 (×2): 1000 mL via INTRAVENOUS

## 2012-07-06 MED ORDER — ENALAPRIL MALEATE 20 MG PO TABS
20.0000 mg | ORAL_TABLET | Freq: Every day | ORAL | Status: DC
  Administered 2012-07-06 – 2012-07-07 (×2): 20 mg via ORAL
  Filled 2012-07-06 (×2): qty 1

## 2012-07-06 MED ORDER — SODIUM CHLORIDE 0.9 % IV SOLN
1000.0000 mL | Freq: Once | INTRAVENOUS | Status: AC
  Administered 2012-07-06: 1000 mL via INTRAVENOUS

## 2012-07-06 MED ORDER — INSULIN ASPART 100 UNIT/ML ~~LOC~~ SOLN
8.0000 [IU] | Freq: Three times a day (TID) | SUBCUTANEOUS | Status: DC | PRN
  Administered 2012-07-06: 8 [IU] via SUBCUTANEOUS
  Filled 2012-07-06: qty 1

## 2012-07-06 MED ORDER — ALPRAZOLAM 0.5 MG PO TABS
0.5000 mg | ORAL_TABLET | Freq: Two times a day (BID) | ORAL | Status: DC | PRN
  Administered 2012-07-06: 0.5 mg via ORAL
  Filled 2012-07-06: qty 1

## 2012-07-06 MED ORDER — NITROGLYCERIN 0.4 MG SL SUBL
0.4000 mg | SUBLINGUAL_TABLET | SUBLINGUAL | Status: DC | PRN
  Administered 2012-07-06 (×2): 0.4 mg via SUBLINGUAL
  Filled 2012-07-06: qty 25

## 2012-07-06 NOTE — ED Notes (Signed)
BMI calculated off of what pt states she weighs is 31.6

## 2012-07-06 NOTE — ED Notes (Signed)
Pt requesting her blood sugar be checked

## 2012-07-06 NOTE — ED Notes (Signed)
Pt is back from XR 

## 2012-07-06 NOTE — ED Notes (Signed)
PT transp. To XR on 02

## 2012-07-06 NOTE — ED Notes (Signed)
Report called to Lelon Mast, RN in CDU

## 2012-07-06 NOTE — ED Provider Notes (Signed)
History     CSN: [id number]  Arrival date & time 07/06/12  1132   First MD Initiated Contact with Patient 07/06/12 1223      Chief Complaint  Patient presents with  . Shoulder Pain  . Chest Pain    (Consider location/radiation/quality/duration/timing/severity/associated sxs/prior treatment) HPI  [name] is a 51 y.o. female c/o central chest pain, radiating to left shoulder, described as knife like, rated at 10/10 at worst, 10/10 now.  Pain started x2.5hours ago while pt was asleep. Pain is described as pleuritic and exacerbated by exertion. Pain has been constant and non-positional. Pain is associated with SOB and nausea. Denies  emesis, diaphoresis, cough, fever, change from her normal smoker's cough, change from normal back pain, syncope. Denies recent travel,  hemoptysis.  Endorses bilateral leg swelling x2 weeks. Pt has not received any ASA or NTG in the last 24 hours.  RF: "light" MI 2007, HTN, IDDM, High Cholesterol Cath: ? Last Stress test: ? Cardiologost: ? PCP: Ramond Craver   Past Medical History  Diagnosis Date  . Diabetes mellitus   . Hypertension   . Anemia   . Chronic back pain   . Neuropathy   . Hypercholesterolemia   . Major depressive disorder, recurrent   . Generalized anxiety disorder   . Panic disorder with agoraphobia     Past Surgical History  Procedure Date  . Foot surgery   . Back surgery   . Hernia repair   . Tonsillectomy and adenoidectomy   . Shoulder surgery   . Knee surgery     Family History  Problem Relation Age of Onset  . Heart disease Mother   . Lung cancer Father     History  Substance Use Topics  . Smoking status: Current Every Day Smoker -- 1.0 packs/day for 20 years    Types: Cigarettes  . Smokeless tobacco: Never Used  . Alcohol Use: No    OB History    Grav Para Term Preterm Abortions TAB SAB Ect Mult Living                  Review of Systems  Constitutional: Negative for fever.  Respiratory: Positive for  shortness of breath.   Cardiovascular: Positive for chest pain.  Gastrointestinal: Positive for nausea. Negative for vomiting, abdominal pain and diarrhea.  All other systems reviewed and are negative.    Allergies  Celecoxib and Ibuprofen  Home Medications   Current Outpatient Rx  Name Route Sig Dispense Refill  . ALBUTEROL 90 MCG/ACT IN AERS Inhalation Inhale 2 puffs into the lungs every 4 (four) hours as needed for wheezing. 17 g 12  . ALPRAZOLAM 0.5 MG PO TABS Oral Take 1 tablet (0.5 mg total) by mouth 2 (two) times daily as needed. For anxiety 20 tablet 0  . BUPROPION HCL 75 MG PO TABS Oral Take 2 tablets (150 mg total) by mouth 2 (two) times daily. 30 tablet 0  . ENALAPRIL MALEATE 20 MG PO TABS Oral Take 20 mg by mouth daily.     Marland Kitchen FLUOXETINE HCL 40 MG PO CAPS Oral Take 1 capsule (40 mg total) by mouth daily. 20 capsule 0  . HYDROCODONE-ACETAMINOPHEN 5-500 MG PO TABS Oral Take 1 tablet by mouth daily as needed. For pain 15 tablet 0  . INSULIN ASPART 100 UNIT/ML [place] SOLN Subcutaneous Inject 10 Units into the skin 3 (three) times daily with meals. 1 vial 1  . INSULIN GLARGINE 100 UNIT/ML [place] SOLN Subcutaneous Inject 30  Units into the skin at bedtime. 10 mL 1  . METOCLOPRAMIDE HCL 10 MG PO TABS Oral Take 10 mg by mouth 3 (three) times daily before meals.     Marland Kitchen PREDNISONE 10 MG PO TABS  Take 6 tablets daily  for 3 days then 4 tablets daily for 3 days ,then 2 tablets daily for 3 days then 1 tablet daily for 3 days 39 tablet 0  . TRAZODONE HCL 100 MG PO TABS Oral Take 100 mg by mouth at bedtime.       BP 133/110  Pulse 69  Temp 98.8 F (37.1 C) (Oral)  Resp 18  SpO2 94%  Physical Exam  Nursing note and vitals reviewed. Constitutional: [name] oriented to person, place, and time. She appears well-developed and well-nourished. No distress.  HENT:  Head: Normocephalic.  Eyes: Conjunctivae normal and EOM are normal. Pupils are equal, round, and reactive to light.  Cardiovascular:  Normal rate, regular rhythm, normal heart sounds and intact distal pulses.   Pulmonary/Chest: Effort normal and breath sounds normal. No stridor. No respiratory distress. She has no wheezes. She has no rales. She exhibits tenderness.       Patient is exquisitely tender to palpation of anterior chest  Abdominal: Soft. Bowel sounds are normal. She exhibits no distension and no mass. There is no tenderness. There is no rebound and no guarding.  Musculoskeletal: Normal range of motion.  Neurological: [name] alert and oriented to person, place, and time.  Psychiatric: She has a normal mood and affect.    ED Course  Procedures (including critical care time)  Labs Reviewed  PRO B NATRIURETIC PEPTIDE - Abnormal; Notable for the following:    Pro B Natriuretic peptide (BNP) 247.4 (*)     All other components within normal limits  CBC - Abnormal; Notable for the following:    WBC 13.3 (*)     All other components within normal limits  COMPREHENSIVE METABOLIC PANEL - Abnormal; Notable for the following:    Potassium 3.4 (*)     Glucose, Bld 137 (*)     All other components within normal limits  GLUCOSE, CAPILLARY - Abnormal; Notable for the following:    Glucose-Capillary 187 (*)     All other components within normal limits  URINALYSIS, ROUTINE W REFLEX MICROSCOPIC  POCT I-STAT TROPONIN I  D-DIMER, QUANTITATIVE   Dg Chest 2 View  07/06/2012  *RADIOLOGY REPORT*  Clinical Data: Chest pain.  CHEST - 2 VIEW  Comparison: October 03, 2011.  Findings: Cardiomediastinal silhouette appears normal.  No acute pulmonary disease is noted.  Old left rib fractures are noted.  IMPRESSION: No acute cardiopulmonary abnormality seen.   Original Report Authenticated By: Venita Sheffield., M.D.      Date: 07/06/2012  Rate: 69  Rhythm: normal sinus rhythm  QRS Axis: normal  Intervals: normal  ST/T Wave abnormalities: nonspecific ST/T changes  Conduction Disutrbances:none  Narrative Interpretation:   Old  EKG Reviewed: unchanged   No diagnosis found.    MDM  DDx: ACS, PE, costochondritis, pneumonia  EKG is nonischemic and troponin is negative. Chest x-ray shows no abnormalities that would explain her pain. Blood work is unremarkable. This patient has multiple cardiac risk factors I think it is reasonable to transfer her to the CDU for low risk cardiac rule out.  2:29 PM pain is still 6/10 after nitroglycerin. I will give her narcotic pain control and also send off a d-dimer.  Filed Vitals:  07/06/12 1245 07/06/12 1301 07/06/12 1307 07/06/12 1347  BP: 145/85 166/87 145/80 101/89  Pulse: 78 82 85 83  Temp:      TempSrc:      Resp: 23 23 18 20   Height:   5\' 2"  (1.575 m)   Weight:   173 lb (78.472 kg)   SpO2: 96% 96% 96% 95%   3:32 PM D-dimer is negative and patient has minimal chest pain upon deep exertion. She will go to CDU and chest pain protocol with coronary CT in the a.m.  Sign out given NP Smith in CDU.           Wynetta Emery, PA-C 07/06/12 1605

## 2012-07-06 NOTE — ED Notes (Signed)
IV team at bedside 

## 2012-07-06 NOTE — ED Notes (Signed)
Heart healthy meal tray ordered for patient. 

## 2012-07-06 NOTE — ED Provider Notes (Signed)
Patient in CDU under chest pain protocol.  Patient with history of pleuritic type chest pain that began while patient was asleep.  Reports associated shortness of breath and nausea.  ECG without indication of ischemia.  Negative cardiac markers and d-dimer.  Pain improved with dilaudid.  Patient awake, alert, with intermittent return of chest discomfort.  No change in ECG.  Lungs CTA bilaterally.  S1/S2, RRR.  Abdomen soft, bowel sounds present.  Patient is scheduled for coronary CT in AM.  Jimmye Norman, NP 07/07/12 [phone]

## 2012-07-06 NOTE — ED Notes (Signed)
Iv team paged.

## 2012-07-06 NOTE — ED Notes (Signed)
Report received from Hayley, RN.

## 2012-07-06 NOTE — ED Notes (Signed)
IV team notified and will come to assess for an 18G IV placement

## 2012-07-06 NOTE — ED Notes (Signed)
Pt to room 2 via [facility] with c/o left shoulder pain since 1000 this morning upon waking. Pain is reproducible with movement and deep respirations. Pt also c/o HA. VSS. Pt on 3L O2 [place]

## 2012-07-07 ENCOUNTER — Observation Stay (HOSPITAL_COMMUNITY): Payer: Medicaid Other

## 2012-07-07 ENCOUNTER — Encounter (HOSPITAL_COMMUNITY): Payer: Self-pay | Admitting: Cardiology

## 2012-07-07 DIAGNOSIS — R079 Chest pain, unspecified: Principal | ICD-10-CM

## 2012-07-07 LAB — GLUCOSE, CAPILLARY: Glucose-Capillary: 220 mg/dL — ABNORMAL HIGH (ref 70–99)

## 2012-07-07 MED ORDER — PREDNISONE (PAK) 10 MG PO TABS: 10.0000 mg | ORAL_TABLET | Freq: Every day | ORAL | Status: DC

## 2012-07-07 MED ORDER — NITROGLYCERIN 0.4 MG SL SUBL
0.4000 mg | SUBLINGUAL_TABLET | Freq: Once | SUBLINGUAL | Status: DC
  Filled 2012-07-07: qty 25

## 2012-07-07 MED ORDER — METOPROLOL TARTRATE 1 MG/ML IV SOLN
5.0000 mg | Freq: Once | INTRAVENOUS | Status: DC
  Filled 2012-07-07: qty 10

## 2012-07-07 MED ORDER — LOSARTAN POTASSIUM 100 MG PO TABS: 100.0000 mg | ORAL_TABLET | Freq: Every day | ORAL | Status: DC

## 2012-07-07 MED ORDER — MORPHINE SULFATE 4 MG/ML IJ SOLN
2.0000 mg | Freq: Once | INTRAMUSCULAR | Status: AC
  Administered 2012-07-07: 2 mg via INTRAVENOUS
  Filled 2012-07-07: qty 1

## 2012-07-07 MED ORDER — ALBUTEROL SULFATE HFA 108 (90 BASE) MCG/ACT IN AERS: 2.0000 | INHALATION_SPRAY | Freq: Four times a day (QID) | RESPIRATORY_TRACT | Status: DC | PRN

## 2012-07-07 MED ORDER — METOPROLOL TARTRATE 25 MG PO TABS: 50.0000 mg | ORAL_TABLET | Freq: Once | ORAL | Status: DC

## 2012-07-07 MED ORDER — METOPROLOL TARTRATE 25 MG PO TABS
100.0000 mg | ORAL_TABLET | Freq: Once | ORAL | Status: AC
  Administered 2012-07-07: 100 mg via ORAL
  Filled 2012-07-07: qty 4

## 2012-07-07 MED ORDER — GI COCKTAIL ~~LOC~~
30.0000 mL | Freq: Once | ORAL | Status: AC
  Administered 2012-07-07: 30 mL via ORAL
  Filled 2012-07-07: qty 30

## 2012-07-07 MED ORDER — ALBUTEROL SULFATE (5 MG/ML) 0.5% IN NEBU
5.0000 mg | INHALATION_SOLUTION | Freq: Once | RESPIRATORY_TRACT | Status: AC
  Administered 2012-07-07: 5 mg via RESPIRATORY_TRACT
  Filled 2012-07-07: qty 1

## 2012-07-07 NOTE — Consult Note (Signed)
CARDIOLOGY CONSULT NOTE  Patient ID: [name], MRN: [id number], DOB/AGE: Feb 08, 1961 51 y.o. Admit date: 07/06/2012 Date of Consult: 07/07/2012  Primary Physician: None Primary Cardiologist: None  Chief Complaint: cough and chest pain  HPI: 51 y.o. female w/ PMHx significant for DMII, HTN, HLD, tobacco abuse, depression, anxiety, and panic disorder who presented to [facility] on 07/06/2012 with complaints of cough and chest pain.  She was evaluated by Dr. Juanda Chance in 2008 for complaints of atypical chest pain at which time a Myoview showed small area of nonreversible decreased perfusion in the inferolateral Macarthur Lorusso, no WMAs, EF 71%. No further ischemic evaluation was indicated. CTA chest 2009 showed normal heart and great vessels. No prior echo or cardiac cath.   Reports chest pain that started around 10am yesterday while getting dressed. It was sharp "like a knife stabbing" all over her chest. No associated nausea or diaphoresis. It was constant for three hours prompting her to present to the ED. It is worse with deep inspiration, coughing, and movement. She has had a productive cough for ~53mos that has become worse recently as well as nasal congestion for the last couple of months. No fever or chills. Continues to smoke cigarettes. No regular medical care "for a while". No recent surgeries, prolonged immobilization or extended travel. Denies shortness of breath, orthopnea, PND or edema. Has gained about 30lbs in the last year due to inactivity 2/2 chronic back/neck pain. Very sedentary, rarely leaves the house, no exercise.  In the ED, EKG shows NSR 69bpm, no acute ST/T changes compared to prior EKGs. CXR is without acute cardiopulmonary findings. Labs are significant for pBNP 247, troponin normal x 3, normal DDimer, WBC 13.3, otherwise unremarkable CBC/CMET. TMax 99.2. [name] not hypoxic, tachycardic, or tachypneic. She received 324mg  ASA, SL NTG, Dilaudid, Morphine, Percocet, GI  cocktail, and albuterol neb. She reports improvement in chest pain, but is not sure which medication helped. She was observed in the CDU with plans for cardiac CT this morning, but due to patient anxiety, inability to hold her breath, and elevated heart rate this was not completed and cardiology is asked to evaluate. During evaluation she is coughing incessantly and noted sharp, pin point chest pain.   Past Medical History  Diagnosis Date  . Diabetes mellitus   . Hypertension   . Anemia   . Chronic back pain   . Neuropathy   . Hypercholesterolemia   . Major depressive disorder, recurrent   . Generalized anxiety disorder   . Panic disorder with agoraphobia   . Tobacco abuse      Myoview 2008 IMPRESSION: Small focus of nonreversible decreased myocardial perfusion involving inferolateral Shantoria Ellwood of left ventricle. While there are scattered artifacts, the observed nonreversible perfusion defect is felt to not be artifactual in origin as this was identified on both the stress and resting SPECT exams. Normal left ventricular ejection fraction of 71% with normal LV Antionetta Ator motion.  Surgical History:  Past Surgical History  Procedure Date  . Foot surgery   . Back surgery   . Hernia repair   . Tonsillectomy and adenoidectomy   . Shoulder surgery   . Knee surgery      Home Meds: Medication Sig  ALPRAZolam (XANAX) 0.5 MG tablet Take 1 tablet (0.5 mg total) by mouth 2 (two) times daily as needed. For anxiety  ARIPiprazole (ABILIFY) 5 MG tablet Take 5 mg by mouth daily.  buPROPion (WELLBUTRIN SR) 150 MG 12 hr tablet Take 150 mg by  mouth 2 (two) times daily.  enalapril (VASOTEC) 20 MG tablet Take 20 mg by mouth daily.   FLUoxetine (PROZAC) 40 MG capsule Take 1 capsule (40 mg total) by mouth daily.  insulin aspart (NOVOLOG) 100 UNIT/ML injection Inject 8 Units into the skin 3 (three) times daily as needed. If CBG > 100  insulin glargine (LANTUS) 100 UNIT/ML injection Inject 20 Units into the skin at  bedtime.  traZODone (DESYREL) 100 MG tablet Take 100 mg by mouth at bedtime.   ARIPiprazole (ABILIFY) 5 MG tablet Take 1 tablet (5 mg total) by mouth at bedtime.    Inpatient Medications:   . sodium chloride  1,000 mL Intravenous Once  . albuterol  5 mg Nebulization Once  . ARIPiprazole  5 mg Oral Daily  . buPROPion  150 mg Oral BID  . enalapril  20 mg Oral Daily  . gi cocktail  30 mL Oral Once  .  HYDROmorphone (DILAUDID) injection  1 mg Intravenous Once  . insulin glargine  20 Units Subcutaneous QHS  . metoprolol tartrate  100 mg Oral Once  .  morphine injection  2 mg Intravenous Once  .  morphine injection  2 mg Intravenous Once  . ondansetron (ZOFRAN) IV  4 mg Intravenous Once  . traZODone  100 mg Oral QHS   . sodium chloride 1,000 mL (07/07/12 0849)    Allergies:  Allergies  Allergen Reactions  . Celecoxib Nausea Only  . Ibuprofen Nausea Only    History   Social History  . Marital Status: Single    Spouse Name: N/A    Number of Children: N/A  . Years of Education: N/A   Occupational History  . Not on file.   Social History Main Topics  . Smoking status: Current Every Day Smoker -- 1.0 packs/day for 20 years    Types: Cigarettes  . Smokeless tobacco: Never Used  . Alcohol Use: No  . Drug Use: No  . Sexually Active: No   Other Topics Concern  . Not on file   Social History Narrative  . No narrative on file     Family History  Problem Relation Age of Onset  . Heart disease Mother   . Lung cancer Father      Review of Systems: General: negative for chills, fever, night sweats or weight changes.  Cardiovascular: As per HPI Dermatological: negative for rash Respiratory: (+) cough, wheezing, nasal congestion Urologic: negative for hematuria Abdominal: negative for nausea, vomiting, diarrhea, bright red blood per rectum, melena, or hematemesis Neurologic: negative for visual changes, syncope, or dizziness All other systems reviewed and are otherwise  negative except as noted above.  Labs:  Lab Results  Component Value Date   WBC 13.3* 07/06/2012   HGB 14.1 07/06/2012   HCT 41.1 07/06/2012   MCV 86.9 07/06/2012   PLT 196 07/06/2012    Lab 07/06/12 1250  NA 138  K 3.4*  CL 100  CO2 30  BUN 11  CREATININE 0.71  CALCIUM 8.9  PROT 6.4  BILITOT 0.5  ALKPHOS 94  ALT 9  AST 12  GLUCOSE 137*     07/06/2012 13:03 07/06/2012 15:47 07/06/2012 19:06  Troponin i, poc 0.00 0.00 0.01     07/06/2012 12:50  Pro B Natriuretic peptide (BNP) 247.4 (H)   Lab Results  Component Value Date   DDIMER 0.42 07/06/2012    Radiology/Studies:   07/06/2012 -  CHEST - 2 VIEW    Findings: Cardiomediastinal silhouette appears normal.  No acute pulmonary disease is noted.  Old left rib fractures are noted.  IMPRESSION: No acute cardiopulmonary abnormality seen.    EKG: 07/06/12 @ 1139 - NSR 69bpm, no acute ST/T changes compared to prior EKGs  Physical Exam: Blood pressure 135/61, pulse 72, temperature 99.2 F (37.3 C), temperature source Oral, resp. rate 20, height 5\' 2"  (1.575 m), weight 176 lb 12.8 oz (80.196 kg), SpO2 95.00%. General: Disheveled appearing white female, in no acute distress. Head: Normocephalic, atraumatic, sclera non-icteric, no xanthomas, nares are without discharge.  Neck: Supple. Negative for carotid bruits. No JVD. Lungs: Poor air movement, diminished with wheezes throughout. Breathing is unlabored. Heart: RRR with S1 S2. No murmurs, rubs, or gallops appreciated. Abdomen: Soft, non-tender, non-distended with normoactive bowel sounds. No rebound/guarding. No obvious abdominal masses. Msk:  Strength and tone appear decreased for age. Extremities: No clubbing or cyanosis. No edema.  Distal pedal pulses are intact and equal bilaterally. Neuro: Alert and oriented X 3. Moves all extremities spontaneously. Psych:  Responds to questions appropriately with a normal affect.   Assessment and Plan:   51 y.o. female w/ PMHx  significant for DMII, HTN, HLD, tobacco abuse, depression, anxiety, and panic disorder who presented to [facility] on 07/06/2012 with complaints of cough and chest pain.  1. Atypical chest pain 2. Cough 3. Leukocytosis 4. Tobacco Abuse 5. Hypertension 6. Hyperlipidemia 7. Diabetes Mellitus 8. Depression/Anxiety/Panic Disorder  Patient presents with atypical chest pain that started >24hrs ago. It is worse with cough, movement, and deep inspiration. Troponins normal x3. EKG without acute ischemic changes. She had a myoview in 2008 that showed a small old scar, but no new ischemia. She does have significant cardiac risk factors, however, there is no objective evidence to suggest cardiac ischemia and her symptoms are atypical. Her symptoms are more likely pulmonary or musculoskeletal in etiology. CXR is without acute findings and DDimer normal. [name] not hypoxic, tachycardic, or tachypneic. Low suspicision for PE. She has a long history of tobacco abuse as well as a productive cough, wheezing, and leukocytosis.  ?Acute bronchitis. Would recommend further evaluation and treatment of pulmonary issues. Avoid BB given active wheezing and possible underlying COPD. Counseled on smoking cessation. Switch ACEI to ARB. Arrange for outpatient pulmonology follow up. Give prescription for Albuterol MDI PRN.    Signed, HOPE, JESSICA PA-C 07/07/2012, 2:54 PM   I have taken a history, reviewed medications, allergies, PMH, SH, FH, and reviewed ROS and examined the patient.  I agree with the assessment and plan. Her discomfort is clearly musculoskeletal secondary to coughing and perhaps from her ACEI. Advised to stop smoking, will give prescriptions for albuterol and losarten. Arranged a office visit with pulmonary.  Sable Knoles C. Daleen Squibb, MD, [facility] Pager:  [phone]

## 2012-07-07 NOTE — ED Provider Notes (Signed)
[name] is a 51 y.o. female in CDU on chest pain protocol from pod A. Sign out from Dr. Hyacinth Meeker as follows: Patient with multiple cardiac risk factors presenting with acute onset of pleuritic chest pain yesterday a.m.  [id number] patient seen and evaluated at the bedside. [name] resting comfortably. She states that her pain is resolved except for sharp pain upon deep breathing. Her lung sounds are clear to auscultation with upper airway wheezing. Heart sounds are regular rate and rhythm with no murmurs rubs or gallops abdominal exam is benign with no tenderness to palpation.  10:29 AM Dr. Kearney Hard called to update me that coronary CT could not be initiated as patient's heart rate is too high, she cannot hold her breath, and she is too agitated for obtaining good images.  Cardiology consult from [facility] initiated.  Sign out given to PA [place] at shift change.   Wynetta Emery, PA-C 07/08/12 1255

## 2012-07-07 NOTE — ED Provider Notes (Signed)
4:13 PM Sign out received from [facility], PA-C.  Plan is for discharge home. Nothing left pending for patient.   Patient has been seen by [facility] cardiology, whose note I have reviewed.  They suspect patient's symptoms may be related to bronchitis - pt does state to me that she has been coughing for several months. Pt is an active smoker.  [place] has prescribed albuterol inhaler and has switched ACE to ARB.  I will add steroids.  They have also referred patient to pulmonology.    I will also give patient PCP resources for follow up.  Pt verbalizes understanding and agrees with this plan.  [place], [place] 07/07/12 [phone]

## 2012-07-07 NOTE — ED Provider Notes (Signed)
Medical screening examination/treatment/procedure(s) were performed by non-physician practitioner and as supervising physician I was immediately available for consultation/collaboration.  Saphia Vanderford R. [place] Lingenfelter, MD 07/07/12 1557 

## 2012-07-07 NOTE — ED Notes (Signed)
Aroused from sleep, C/o pain, "no change, still the same, 8/10, worse with movement", pinpoints to epigastric sub-sternal area. Alert, NAD, calm, interactive, skin W&D, resps e/u, speaking in clear complete sentences, VSS. (BP cuff slides down arm to wrong place frequently, needs to be frequently adjusted to prevent erroneous BP readings).

## 2012-07-07 NOTE — ED Notes (Signed)
Pt sleeping/ easily arousable, amiable, alert, interactive, calm, skin W&D, resps e/u, speaking in clear complete sentences, NSR on monitor, VSS.

## 2012-07-08 NOTE — ED Provider Notes (Signed)
Medical screening examination/treatment/procedure(s) were performed by non-physician practitioner and as supervising physician I was immediately available for consultation/collaboration.    Vida Roller, MD 07/08/12 0430

## 2012-07-09 NOTE — ED Provider Notes (Signed)
Medical screening examination/treatment/procedure(s) were performed by non-physician practitioner and as supervising physician I was immediately available for consultation/collaboration.  Chasyn Cinque L Sheralyn Pinegar, MD 07/09/12 0651 

## 2012-07-13 NOTE — ED Provider Notes (Signed)
Medical screening examination/treatment/procedure(s) were performed by non-physician practitioner and as supervising physician I was immediately available for consultation/collaboration.   Suzi Roots, MD 07/13/12 [phone]

## 2012-07-22 ENCOUNTER — Institutional Professional Consult (permissible substitution): Payer: Medicaid Other | Admitting: Internal Medicine

## 2012-07-30 ENCOUNTER — Encounter: Payer: Self-pay | Admitting: Internal Medicine

## 2012-07-30 ENCOUNTER — Ambulatory Visit (INDEPENDENT_AMBULATORY_CARE_PROVIDER_SITE_OTHER): Payer: Medicaid Other | Admitting: Internal Medicine

## 2012-07-30 VITALS — BP 142/88 | HR 68 | Temp 98.4°F | Ht 62.0 in | Wt 175.6 lb

## 2012-07-30 DIAGNOSIS — J449 Chronic obstructive pulmonary disease, unspecified: Secondary | ICD-10-CM

## 2012-07-30 MED ORDER — OMEPRAZOLE 20 MG PO CPDR: DELAYED_RELEASE_CAPSULE | ORAL | Status: DC

## 2012-07-30 NOTE — Patient Instructions (Addendum)
Congratulations on not smoking  - it's the most important aspect of your care  Start omeprazole 20 mg Take 30-60 min before first meal of the day   GERD (REFLUX)  is an extremely common cause of respiratory symptoms and problem swallowing like what you have, many times with no significant heartburn at all.    It can be treated with medication, but also with lifestyle changes including avoidance of late meals, excessive alcohol, smoking cessation, and avoid fatty foods, chocolate, peppermint, colas, red wine, and acidic juices such as orange juice.  NO MINT OR MENTHOL PRODUCTS SO NO COUGH DROPS  USE SUGARLESS CANDY INSTEAD (jolley ranchers or Stover's)  NO OIL BASED VITAMINS - use powdered substitutes.    Only use your albuterol (proventil)as a rescue medication to be used if you can't catch your breath by resting or doing a relaxed purse lip breathing pattern. The less you use it, the better it will work when you need it.   Work on inhaler technique:  relax and gently blow all the way out then take a nice smooth deep breath back in, triggering the inhaler at same time you start breathing in.  Hold for up to 5 seconds if you can.  Rinse and gargle with water when done   If your mouth or throat starts to bother you,   I suggest you time the inhaler to your dental care and after using the inhaler(s) brush teeth and tongue with a baking soda containing toothpaste and when you rinse this out, gargle with it first to see if this helps your mouth and throat.     Please schedule a follow up office visit in 4 weeks, sooner if needed with pfts

## 2012-07-30 NOTE — Progress Notes (Signed)
  Subjective:    Patient ID: [name], female    DOB: [birth year]  MRN: [id number]  HPI  41 yowf quit smoking 06/2012 with 2 year h/o variable short of breath admit with cp with neg cardiac w/u referred 07/30/2012 by Dr Juanito Doom for copd.   07/30/2012 1st pulmonary ov/ Wert cc limited by back rather than breathing to point can't do shopping anymore > mostly spends days sitting watching tv,  Min prod cough worse in winter, min mucoid sputum, better since quit. Also lose breath at rest, not really sure inhaler helps.  Sleeping ok without nocturnal  or early am exacerbation  of respiratory  c/o's or need for noct saba. Also denies any obvious fluctuation of symptoms with weather or environmental changes or other aggravating or alleviating factors except as outlined above     Review of Systems  Constitutional: Negative for fever, chills and unexpected weight change.  HENT: Positive for ear pain, congestion, trouble swallowing and dental problem. Negative for nosebleeds, sore throat, rhinorrhea, sneezing, voice change, postnasal drip and sinus pressure.   Eyes: Negative for visual disturbance.  Respiratory: Positive for cough and shortness of breath. Negative for choking.   Cardiovascular: Positive for chest pain. Negative for leg swelling.  Gastrointestinal: Negative for vomiting, abdominal pain and diarrhea.  Genitourinary: Negative for difficulty urinating.  Musculoskeletal: Positive for arthralgias.  Skin: Negative for rash.  Neurological: Positive for headaches. Negative for tremors and syncope.  Hematological: Does not bruise/bleed easily.       Objective:   Physical Exam   Animated wf with ? Push of speech nad Wt Readings from Last 3 Encounters:  07/30/12 175 lb 9.6 oz (79.652 kg)  07/06/12 176 lb 12.8 oz (80.196 kg)  10/07/11 175 lb 7.8 oz (79.6 kg)    HEENT: very poor  dentition, turbinates, and orophanx. Nl external ear canals without cough reflex   NECK :  without  JVD/Nodes/TM/ nl carotid upstrokes bilaterally   LUNGS: no acc muscle use, clear to A and P bilaterally without cough on insp or exp maneuvers   CV:  RRR  no s3 or murmur or increase in P2, no edema   ABD:  soft and nontender with nl excursion in the supine position. No bruits or organomegaly, bowel sounds nl  MS:  warm without deformities, calf tenderness, cyanosis or clubbing  SKIN: warm and dry without lesions    NEURO:  alert, approp, no deficits    07/06/12 cxr Findings: Cardiomediastinal silhouette appears normal. No acute  pulmonary disease is noted. Old left rib fractures are noted.  IMPRESSION:  No acute cardiopulmonary abnormality seen      Assessment & Plan:

## 2012-08-01 NOTE — Assessment & Plan Note (Addendum)
  When respiratory symptoms begin or become refractory well after a patient reports complete smoking cessation,  Especially when this wasn't the case while they were smoking, a red flag is raised based on the work of Dr Primitivo Gauze which states:  if you quit smoking when your best day FEV1 is still well preserved it is highly unlikely you will progress to severe disease.  That is to say, once the smoking stops,  the symptoms should not suddenly erupt or markedly worsen.  If so, the differential diagnosis should include  obesity/deconditioning,  LPR/Reflux/Aspiration syndromes,  occult CHF, or  especially side effect of medications commonly used in this population.    ? Acid reflux > rx reviewed  ? Anxiety > sob at rest not responsive to saba strongly suggests this might be a component, perhaps uncovered by absence of nicotine which might have masked this component until she quit breathing.   The proper method of use, as well as anticipated side effects, of a metered-dose inhaler are discussed and demonstrated to the patient. Improved effectiveness after extensive coaching during this visit to a level of approximately  75%

## 2012-09-11 ENCOUNTER — Other Ambulatory Visit: Payer: Self-pay | Admitting: Cardiology

## 2012-09-14 ENCOUNTER — Ambulatory Visit: Payer: Medicaid Other | Admitting: Internal Medicine

## 2012-10-12 ENCOUNTER — Other Ambulatory Visit: Payer: Self-pay | Admitting: Internal Medicine

## 2012-12-21 ENCOUNTER — Other Ambulatory Visit: Payer: Self-pay

## 2013-01-19 ENCOUNTER — Other Ambulatory Visit: Payer: Self-pay | Admitting: Internal Medicine

## 2013-02-09 ENCOUNTER — Other Ambulatory Visit: Payer: Self-pay | Admitting: Internal Medicine

## 2013-04-27 ENCOUNTER — Other Ambulatory Visit: Payer: Self-pay

## 2013-04-27 DIAGNOSIS — Z1231 Encounter for screening mammogram for malignant neoplasm of breast: Secondary | ICD-10-CM

## 2013-05-13 ENCOUNTER — Ambulatory Visit: Payer: Self-pay

## 2013-05-28 ENCOUNTER — Other Ambulatory Visit: Payer: Self-pay | Admitting: Gastroenterology

## 2013-05-31 ENCOUNTER — Ambulatory Visit
Admission: RE | Admit: 2013-05-31 | Discharge: 2013-05-31 | Disposition: A | Discharge status: Home / Self Care | Payer: Medicaid Other | Source: Ambulatory Visit

## 2013-05-31 DIAGNOSIS — Z1231 Encounter for screening mammogram for malignant neoplasm of breast: Secondary | ICD-10-CM

## 2013-06-01 ENCOUNTER — Other Ambulatory Visit: Payer: Self-pay | Admitting: Internal Medicine

## 2013-06-01 DIAGNOSIS — R928 Other abnormal and inconclusive findings on diagnostic imaging of breast: Secondary | ICD-10-CM

## 2013-06-21 ENCOUNTER — Ambulatory Visit
Admission: RE | Admit: 2013-06-21 | Discharge: 2013-06-21 | Disposition: A | Discharge status: Home / Self Care | Payer: Medicaid Other | Source: Ambulatory Visit | Attending: Internal Medicine | Admitting: Internal Medicine

## 2013-06-21 DIAGNOSIS — R928 Other abnormal and inconclusive findings on diagnostic imaging of breast: Secondary | ICD-10-CM

## 2013-07-06 ENCOUNTER — Ambulatory Visit (INDEPENDENT_AMBULATORY_CARE_PROVIDER_SITE_OTHER): Payer: Medicaid Other

## 2013-07-06 VITALS — BP 145/77 | HR 76 | Resp 16 | Ht 62.0 in | Wt 202.0 lb

## 2013-07-06 DIAGNOSIS — R269 Unspecified abnormalities of gait and mobility: Secondary | ICD-10-CM

## 2013-07-06 DIAGNOSIS — Q828 Other specified congenital malformations of skin: Secondary | ICD-10-CM

## 2013-07-06 DIAGNOSIS — E1149 Type 2 diabetes mellitus with other diabetic neurological complication: Secondary | ICD-10-CM

## 2013-07-06 DIAGNOSIS — B351 Tinea unguium: Secondary | ICD-10-CM

## 2013-07-06 DIAGNOSIS — Q6600 Congenital talipes equinovarus, unspecified foot: Secondary | ICD-10-CM

## 2013-07-06 DIAGNOSIS — E1142 Type 2 diabetes mellitus with diabetic polyneuropathy: Secondary | ICD-10-CM

## 2013-07-06 DIAGNOSIS — E1140 Type 2 diabetes mellitus with diabetic neuropathy, unspecified: Secondary | ICD-10-CM

## 2013-07-06 DIAGNOSIS — M79609 Pain in unspecified limb: Secondary | ICD-10-CM

## 2013-07-06 NOTE — Patient Instructions (Signed)

## 2013-07-06 NOTE — Progress Notes (Signed)
  Subjective:    Patient ID: [name], female    DOB: Feb 16, 1961, 52 y.o.   MRN: [id number]  "My toenails hurt and I need to get my callouses trimmed"   HPI Comments: N  Sharp pain, sore  L  Debridement toenails b/l and callouses b/l D  3 months O  gradually C  Gotten worse A  Shoes, walking T  See Podiatrist every 3 months      Review of Systems  Constitutional: Positive for appetite change.  Eyes: Negative.   Respiratory: Positive for shortness of breath and wheezing.   Cardiovascular: Positive for chest pain and leg swelling.  Gastrointestinal: Negative.   Endocrine: Positive for cold intolerance.  Genitourinary: Negative.   Musculoskeletal: Positive for arthralgias, back pain, gait problem and neck pain.  Skin: Negative.   Allergic/Immunologic: Negative.   Neurological: Positive for headaches.  Hematological: Bruises/bleeds easily.       Objective:   Physical Exam  Vitals reviewed. Constitutional: She appears well-developed and well-nourished.  Cardiovascular:  Pulses:      Dorsalis pedis pulses are 2+ on the right side, and 2+ on the left side.       Posterior tibial pulses are 1+ on the right side, and 1+ on the left side.  Capillary refill timed 3-4 seconds all digits. Skin temperature warm. Turgor normal. No edema. Mild varicosities bilateral  Musculoskeletal:       Right foot: She exhibits deformity.       Left foot: She exhibits deformity.  Bilateral lower crease demonstrate deformity with residual talipes equinovarus or clubfoot deformity with plantarflexed metatarsal and met adductus type appearance. The cavovarus type foot with keratoses sub-5 bilateral in sub-1 left. There is history of previous ulcerations.  Neurological: [name] alert. She exhibits abnormal muscle tone.  Epicritic sensation diminished on Semmes Weinstein testing bilateral. To the digits and forefoot equally. There is normal plantar response. DTRs not listed.  Skin: Skin is warm and  dry. Rash is not vesicular. No cyanosis. Nails show no clubbing.  Absent hair growth bilateral. Skin color pigment normal. Multiple keratoses sub-5 bilateral sub-1 left with history of present also or hemorrhage a keratoses.  Psychiatric: Her behavior is normal.          Assessment & Plan:  Assessment diabetes with peripheral neuropathy and foot deformity abnormality gait. Patient has residual clubfoot/talipes equinovarus deformity bilateral. Has worn custom shoes with molded inlays in the past and requests replacement at this time. A prescription was issued at this time for [employer] orth[employer] to provide custom molded shoes and Plastizote inlays. Again patient diabetes and neuropathy and deformity place her at high risk for ulceration and complications. Nails debrided x10 at this visit followup in 3 months for palliative care  Alvan Dame DPM

## 2013-08-27 ENCOUNTER — Encounter (HOSPITAL_COMMUNITY): Payer: Self-pay | Admitting: Emergency Medicine

## 2013-08-27 ENCOUNTER — Emergency Department (HOSPITAL_COMMUNITY): Payer: Medicaid Other

## 2013-08-27 ENCOUNTER — Inpatient Hospital Stay (HOSPITAL_COMMUNITY)
Admission: EM | Admit: 2013-08-27 | Discharge: 2013-08-30 | DRG: 192 | Disposition: A | Discharge status: Home / Self Care | Payer: Medicaid Other | Attending: Internal Medicine | Admitting: Internal Medicine

## 2013-08-27 ENCOUNTER — Other Ambulatory Visit: Payer: Self-pay

## 2013-08-27 DIAGNOSIS — M549 Dorsalgia, unspecified: Secondary | ICD-10-CM | POA: Diagnosis present

## 2013-08-27 DIAGNOSIS — F4001 Agoraphobia with panic disorder: Secondary | ICD-10-CM | POA: Diagnosis present

## 2013-08-27 DIAGNOSIS — J441 Chronic obstructive pulmonary disease with (acute) exacerbation: Principal | ICD-10-CM | POA: Diagnosis present

## 2013-08-27 DIAGNOSIS — E876 Hypokalemia: Secondary | ICD-10-CM | POA: Diagnosis not present

## 2013-08-27 DIAGNOSIS — I252 Old myocardial infarction: Secondary | ICD-10-CM

## 2013-08-27 DIAGNOSIS — Z87891 Personal history of nicotine dependence: Secondary | ICD-10-CM

## 2013-08-27 DIAGNOSIS — E039 Hypothyroidism, unspecified: Secondary | ICD-10-CM | POA: Diagnosis present

## 2013-08-27 DIAGNOSIS — J449 Chronic obstructive pulmonary disease, unspecified: Secondary | ICD-10-CM

## 2013-08-27 DIAGNOSIS — Z794 Long term (current) use of insulin: Secondary | ICD-10-CM

## 2013-08-27 DIAGNOSIS — G589 Mononeuropathy, unspecified: Secondary | ICD-10-CM | POA: Diagnosis present

## 2013-08-27 DIAGNOSIS — R0600 Dyspnea, unspecified: Secondary | ICD-10-CM

## 2013-08-27 DIAGNOSIS — I2510 Atherosclerotic heart disease of native coronary artery without angina pectoris: Secondary | ICD-10-CM | POA: Diagnosis present

## 2013-08-27 DIAGNOSIS — I119 Hypertensive heart disease without heart failure: Secondary | ICD-10-CM | POA: Diagnosis present

## 2013-08-27 DIAGNOSIS — E119 Type 2 diabetes mellitus without complications: Secondary | ICD-10-CM | POA: Diagnosis present

## 2013-08-27 DIAGNOSIS — F329 Major depressive disorder, single episode, unspecified: Secondary | ICD-10-CM

## 2013-08-27 DIAGNOSIS — I050 Rheumatic mitral stenosis: Secondary | ICD-10-CM | POA: Diagnosis present

## 2013-08-27 DIAGNOSIS — K219 Gastro-esophageal reflux disease without esophagitis: Secondary | ICD-10-CM | POA: Diagnosis present

## 2013-08-27 DIAGNOSIS — G8929 Other chronic pain: Secondary | ICD-10-CM | POA: Diagnosis present

## 2013-08-27 DIAGNOSIS — F3289 Other specified depressive episodes: Secondary | ICD-10-CM | POA: Diagnosis present

## 2013-08-27 DIAGNOSIS — Z79899 Other long term (current) drug therapy: Secondary | ICD-10-CM

## 2013-08-27 DIAGNOSIS — Z720 Tobacco use: Secondary | ICD-10-CM

## 2013-08-27 DIAGNOSIS — E785 Hyperlipidemia, unspecified: Secondary | ICD-10-CM | POA: Diagnosis present

## 2013-08-27 DIAGNOSIS — R079 Chest pain, unspecified: Secondary | ICD-10-CM

## 2013-08-27 DIAGNOSIS — F411 Generalized anxiety disorder: Secondary | ICD-10-CM | POA: Diagnosis present

## 2013-08-27 DIAGNOSIS — I10 Essential (primary) hypertension: Secondary | ICD-10-CM | POA: Diagnosis present

## 2013-08-27 LAB — POCT I-STAT, CHEM 8
BUN: 15 mg/dL (ref 6–23)
Calcium, Ion: 0.96 mmol/L — ABNORMAL LOW (ref 1.12–1.23)
Chloride: 96 mEq/L (ref 96–112)
HCT: 38 % (ref 36.0–46.0)
Potassium: 7.6 mEq/L (ref 3.5–5.1)

## 2013-08-27 LAB — COMPREHENSIVE METABOLIC PANEL
ALT: 10 U/L (ref 0–35)
AST: 21 U/L (ref 0–37)
Albumin: 3.2 g/dL — ABNORMAL LOW (ref 3.5–5.2)
Alkaline Phosphatase: 109 U/L (ref 39–117)
Calcium: 8.1 mg/dL — ABNORMAL LOW (ref 8.4–10.5)
Chloride: 94 mEq/L — ABNORMAL LOW (ref 96–112)
Creatinine, Ser: 0.81 mg/dL (ref 0.50–1.10)
GFR calc Af Amer: >90 mL/min (ref >90)
Glucose, Bld: 120 mg/dL — ABNORMAL HIGH (ref 70–99)
Potassium: 3.9 mEq/L (ref 3.5–5.1)
Sodium: 135 mEq/L (ref 135–145)
Total Protein: 6.4 g/dL (ref 6.0–8.3)

## 2013-08-27 LAB — CBC
MCV: 89.5 fL (ref 78.0–100.0)
Platelets: 266 10*3/uL (ref 150–400)
RBC: 3.9 MIL/uL (ref 3.87–5.11)
RDW: 14.6 % (ref 11.5–15.5)
WBC: 9.2 10*3/uL (ref 4.0–10.5)

## 2013-08-27 LAB — POCT I-STAT TROPONIN I: Troponin i, poc: 0 ng/mL (ref 0.00–0.08)

## 2013-08-27 MED ORDER — SODIUM CHLORIDE 0.9 % IV SOLN
Freq: Once | INTRAVENOUS | Status: AC
  Administered 2013-08-27: 500 mL/h via INTRAVENOUS

## 2013-08-27 MED ORDER — ALBUTEROL (5 MG/ML) CONTINUOUS INHALATION SOLN
10.0000 mg/h | INHALATION_SOLUTION | Freq: Once | RESPIRATORY_TRACT | Status: AC
  Administered 2013-08-27: 10 mg/h via RESPIRATORY_TRACT
  Filled 2013-08-27: qty 80

## 2013-08-27 MED ORDER — ASPIRIN 81 MG PO CHEW
324.0000 mg | CHEWABLE_TABLET | Freq: Once | ORAL | Status: AC
  Administered 2013-08-27: 324 mg via ORAL
  Filled 2013-08-27: qty 4

## 2013-08-27 MED ORDER — SODIUM CHLORIDE 0.9 % IV BOLUS (SEPSIS)
500.0000 mL | Freq: Once | INTRAVENOUS | Status: AC
  Administered 2013-08-27: 500 mL via INTRAVENOUS

## 2013-08-27 MED ORDER — NITROGLYCERIN 0.4 MG SL SUBL
0.4000 mg | SUBLINGUAL_TABLET | SUBLINGUAL | Status: DC | PRN
  Filled 2013-08-27: qty 25

## 2013-08-27 NOTE — ED Notes (Signed)
Dr. Ward in to see patient

## 2013-08-27 NOTE — ED Provider Notes (Signed)
TIME SEEN: 7:24 PM  CHIEF COMPLAINT: Chest pain, shortness of breath  HPI: Patient is a 52 year old female with a history of diabetes, hypertension, hyperlipidemia, tobacco use who presents the emergency department with one week of intermittent left-sided chest heaviness without radiation and shortness of breath. She states her symptoms are worse with exertion and better with rest. They're associated with nausea, diaphoresis and dizziness. Her last stress test was in 2008 and showed non-reversible inferior ischemic changes. She denies a prior history of cardiac catheterization. She has had a dry cough but no fevers or chills. No history of COPD or oxygen use. No prior history of PE or DVT. No recent hospitalization, surgery, trauma, fracture, long flight or exogenous estrogen use.  ROS: See HPI Constitutional: no fever  Eyes: no drainage  ENT: no runny nose   Cardiovascular:  chest pain  Resp:  SOB  GI: no vomiting GU: no dysuria Integumentary: no rash  Allergy: no hives  Musculoskeletal: no leg swelling  Neurological: no slurred speech ROS otherwise negative  PAST MEDICAL HISTORY/PAST SURGICAL HISTORY:  Past Medical History  Diagnosis Date  . Diabetes mellitus   . Hypertension   . Anemia   . Chronic back pain   . Neuropathy   . Hypercholesterolemia   . Major depressive disorder, recurrent   . Generalized anxiety disorder   . Panic disorder with agoraphobia   . Tobacco abuse     MEDICATIONS:  Prior to Admission medications   Medication Sig Start Date End Date Taking? Authorizing Provider  ALPRAZolam Prudy Feeler) 0.5 MG tablet Take 1 tablet (0.5 mg total) by mouth 2 (two) times daily as needed. For anxiety 08/04/11  Yes Simbiso Ranga, MD  ARIPiprazole (ABILIFY) 5 MG tablet Take 5 mg by mouth daily.   Yes Historical Provider, MD  buPROPion (WELLBUTRIN SR) 150 MG 12 hr tablet Take 150 mg by mouth 2 (two) times daily.   Yes Historical Provider, MD  HYDROCHLOROTHIAZIDE PO Take 1 tablet  by mouth daily.   Yes Historical Provider, MD  insulin aspart (NOVOLOG) 100 UNIT/ML injection Inject 8 Units into the skin 3 (three) times daily as needed. If CBG > 100   Yes Historical Provider, MD  insulin glargine (LANTUS) 100 UNIT/ML injection Inject 40 Units into the skin at bedtime.    Yes Historical Provider, MD  losartan (COZAAR) 100 MG tablet Take 1 tablet (100mg ) by mouth daily 09/11/12  Yes Jessica A Hope, PA-C  omeprazole (PRILOSEC) 20 MG capsule Take 1 capsule 30-60 minutes before first meal of the day 10/12/12  Yes Nyoka Cowden, MD  PROAIR HFA 108 [phone] BASE) MCG/ACT inhaler Inhale 2 puffs into the lungs every 6 hours as needed for wheezing or shortness of breath 09/11/12  Yes Jessica A Hope, PA-C  traZODone (DESYREL) 100 MG tablet Take 100 mg by mouth at bedtime.    Yes Historical Provider, MD  ARIPiprazole (ABILIFY) 5 MG tablet Take 1 tablet (5 mg total) by mouth at bedtime. 08/17/11 09/16/11  Zannie Cove, MD  FLUoxetine (PROZAC) 40 MG capsule Take 1 capsule (40 mg total) by mouth daily. 08/17/11 08/16/12  Zannie Cove, MD    ALLERGIES:  Allergies  Allergen Reactions  . Celecoxib Nausea Only  . Ibuprofen Nausea Only    SOCIAL HISTORY:  History  Substance Use Topics  . Smoking status: Former Smoker -- 1.00 packs/day for 20 years    Types: Cigarettes    Quit date: 07/05/2012  . Smokeless tobacco: Never Used  . Alcohol  Use: No    FAMILY HISTORY: Family History  Problem Relation Age of Onset  . Heart disease Mother   . Lung cancer Father     was a former smoker    EXAM: BP 116/55  Pulse 84  Temp(Src) 98.7 F (37.1 C)  Resp 18  Ht 5\' 2"  (1.575 m)  Wt 205 lb (92.987 kg)  BMI 37.49 kg/m2  SpO2 93% CONSTITUTIONAL: Alert and oriented and responds appropriately to questions. Well-appearing; well-nourished HEAD: Normocephalic EYES: Conjunctivae clear, PERRL ENT: normal nose; no rhinorrhea; moist mucous membranes; pharynx without lesions noted NECK: Supple,  no meningismus, no LAD  CARD: RRR; S1 and S2 appreciated; no murmurs, no clicks, no rubs, no gallops RESP: Normal chest excursion without splinting; patient speak short sentences only, she is mildly tachypneic, expiratory wheeze diffusely with diminished aeration at bases bilaterally, no rhonchi or rales ABD/GI: Normal bowel sounds; non-distended; soft, non-tender, no rebound, no guarding BACK:  The back appears normal and is non-tender to palpation, there is no CVA tenderness EXT: Normal ROM in all joints; non-tender to palpation; no edema; normal capillary refill; no cyanosis    SKIN: Normal color for age and race; warm NEURO: Moves all extremities equally PSYCH: The patient's mood and manner are appropriate. Grooming and personal hygiene are appropriate.  MEDICAL DECISION MAKING: Patient here with chest pain shortness of breath with multiple risk factors for ACS. Her last stress test was in 2008. She has diffuse wheezing on exam. Will give continuous albuterol. Her initial potassium was 7.8. She denies taking any potassium supplements. [name] on hydrochlorothiazide but no other diuretic. She denies a prior history of hyperkalemia. Will give small fluids and repeat potassium. Anticipate patient will need admission. Her primary care physician is Dr. Gwyneth Revels with [facility].  She does not have a cardiologist.  ED PROGRESS: Patient's repeat potassium is 3.9. Her other labs are unremarkable. Her troponin is negative. Her chest x-ray shows bilateral linear atelectasis versus scarring and stable cardiomegaly. No infiltrate or edema. Patient is still complaining of chest pain that we have been limited in medications we can give her secondary to her intermittent hypotension. Her repeat EKG is unchanged. Her shortness of breath is improved and her lungs are now clear to auscultation with good aeration after albuterol treatment. Given her multiple risk factors for ACS and concerning story, we'll discuss with  hospitalist for admission for ACS rule out.  Spoke with hospitalist for admission.     EKG Interpretation    Date/Time:  Friday August 27 2013 19:13:56 EST Ventricular Rate:  85 PR Interval:  165 QRS Duration: 84 QT Interval:  408 QTC Calculation: 485 R Axis:   47 Text Interpretation:  Sinus rhythm Borderline low voltage, extremity leads Borderline prolonged QT interval No significant change since last tracing Confirmed by WARD  DO, KRISTEN ([id number]) on 08/27/2013 10:02:51 PM               Layla Maw Ward, DO 08/28/13 [id number]

## 2013-08-27 NOTE — ED Notes (Signed)
Called lab to follow up on pt CMP results, lab states they will run the lab work

## 2013-08-27 NOTE — ED Notes (Signed)
Lab called cmp hemolyzed

## 2013-08-27 NOTE — ED Notes (Addendum)
EMS reports being called out for cp that started yesterday and got worse today. Pt denies n/v/d with EMS, lungs sounds clear. Pt given 324 mg of asprin en route as well as 1 nitro with relief. Pt now complains of 5/10 cp. Hx of MI in 2007

## 2013-08-27 NOTE — ED Notes (Signed)
Potassium results reported to Dr.Ward

## 2013-08-27 NOTE — ED Notes (Signed)
Dr. Elesa Massed made aware that pt BP is 93/63 and RN held [facility], Dr. Elesa Massed states to start bolus and hold nitro.

## 2013-08-28 ENCOUNTER — Encounter (HOSPITAL_COMMUNITY): Payer: Self-pay | Admitting: Internal Medicine

## 2013-08-28 DIAGNOSIS — I059 Rheumatic mitral valve disease, unspecified: Secondary | ICD-10-CM

## 2013-08-28 DIAGNOSIS — R079 Chest pain, unspecified: Secondary | ICD-10-CM | POA: Diagnosis present

## 2013-08-28 DIAGNOSIS — J441 Chronic obstructive pulmonary disease with (acute) exacerbation: Principal | ICD-10-CM | POA: Diagnosis present

## 2013-08-28 DIAGNOSIS — E119 Type 2 diabetes mellitus without complications: Secondary | ICD-10-CM | POA: Diagnosis present

## 2013-08-28 DIAGNOSIS — I10 Essential (primary) hypertension: Secondary | ICD-10-CM

## 2013-08-28 LAB — GLUCOSE, CAPILLARY
Glucose-Capillary: 306 mg/dL — ABNORMAL HIGH (ref 70–99)
Glucose-Capillary: 326 mg/dL — ABNORMAL HIGH (ref 70–99)
Glucose-Capillary: 293 mg/dL — ABNORMAL HIGH (ref 70–99)
Glucose-Capillary: 330 mg/dL — ABNORMAL HIGH (ref 70–99)

## 2013-08-28 LAB — COMPREHENSIVE METABOLIC PANEL
Albumin: 3.4 g/dL — ABNORMAL LOW (ref 3.5–5.2)
Alkaline Phosphatase: 117 U/L (ref 39–117)
BUN: 12 mg/dL (ref 6–23)
CO2: 29 mEq/L (ref 19–32)
Calcium: 8.2 mg/dL — ABNORMAL LOW (ref 8.4–10.5)
Chloride: 92 mEq/L — ABNORMAL LOW (ref 96–112)
Creatinine, Ser: 0.8 mg/dL (ref 0.50–1.10)
GFR calc Af Amer: >90 mL/min (ref >90)
Potassium: 3.4 mEq/L — ABNORMAL LOW (ref 3.5–5.1)
Total Bilirubin: 0.6 mg/dL (ref 0.3–1.2)

## 2013-08-28 LAB — TROPONIN I
Troponin I: <0.3 ng/mL (ref <0.30)
Troponin I: <0.3 ng/mL (ref <0.30)

## 2013-08-28 LAB — CBC
Hemoglobin: 11.6 g/dL — ABNORMAL LOW (ref 12.0–15.0)
MCV: 88.5 fL (ref 78.0–100.0)
Platelets: 250 10*3/uL (ref 150–400)
RBC: 4 MIL/uL (ref 3.87–5.11)
WBC: 9.3 10*3/uL (ref 4.0–10.5)

## 2013-08-28 LAB — TSH: TSH: 34.809 u[IU]/mL — ABNORMAL HIGH (ref 0.350–4.500)

## 2013-08-28 LAB — HEMOGLOBIN A1C
Hgb A1c MFr Bld: 8.9 % — ABNORMAL HIGH (ref <5.7)
Mean Plasma Glucose: 209 mg/dL — ABNORMAL HIGH (ref <117)

## 2013-08-28 LAB — MAGNESIUM: Magnesium: 2 mg/dL (ref 1.5–2.5)

## 2013-08-28 LAB — PHOSPHORUS: Phosphorus: 2.9 mg/dL (ref 2.3–4.6)

## 2013-08-28 LAB — POCT I-STAT TROPONIN I

## 2013-08-28 LAB — PRO B NATRIURETIC PEPTIDE: Pro B Natriuretic peptide (BNP): 304.3 pg/mL — ABNORMAL HIGH (ref 0–125)

## 2013-08-28 MED ORDER — POTASSIUM CHLORIDE CRYS ER 20 MEQ PO TBCR: 40.0000 meq | EXTENDED_RELEASE_TABLET | Freq: Once | ORAL | Status: DC

## 2013-08-28 MED ORDER — PANTOPRAZOLE SODIUM 40 MG PO TBEC
40.0000 mg | DELAYED_RELEASE_TABLET | Freq: Every day | ORAL | Status: DC
  Administered 2013-08-28 – 2013-08-30 (×3): 40 mg via ORAL
  Filled 2013-08-28: qty 1

## 2013-08-28 MED ORDER — GUAIFENESIN ER 600 MG PO TB12
600.0000 mg | ORAL_TABLET | Freq: Two times a day (BID) | ORAL | Status: DC
  Administered 2013-08-28 – 2013-08-30 (×5): 600 mg via ORAL
  Filled 2013-08-28 (×6): qty 1

## 2013-08-28 MED ORDER — GUAIFENESIN-DM 100-10 MG/5ML PO SYRP: 5.0000 mL | ORAL_SOLUTION | ORAL | Status: DC | PRN

## 2013-08-28 MED ORDER — IPRATROPIUM BROMIDE 0.02 % IN SOLN
0.5000 mg | Freq: Four times a day (QID) | RESPIRATORY_TRACT | Status: DC
  Administered 2013-08-28: 0.5 mg via RESPIRATORY_TRACT
  Filled 2013-08-28: qty 2.5

## 2013-08-28 MED ORDER — BUPROPION HCL ER (SR) 150 MG PO TB12
150.0000 mg | ORAL_TABLET | Freq: Two times a day (BID) | ORAL | Status: DC
  Administered 2013-08-28 – 2013-08-30 (×5): 150 mg via ORAL
  Filled 2013-08-28 (×6): qty 1

## 2013-08-28 MED ORDER — TRAZODONE HCL 100 MG PO TABS
100.0000 mg | ORAL_TABLET | Freq: Every day | ORAL | Status: DC
Start: 2013-08-28 — End: 2013-08-30
  Administered 2013-08-28 – 2013-08-29 (×2): 100 mg via ORAL
  Filled 2013-08-28 (×3): qty 1

## 2013-08-28 MED ORDER — HYDROCODONE-ACETAMINOPHEN 5-325 MG PO TABS
1.0000 | ORAL_TABLET | ORAL | Status: DC | PRN
  Administered 2013-08-28: 1 via ORAL
  Administered 2013-08-29: 2 via ORAL
  Administered 2013-08-30 (×2): 1 via ORAL
  Filled 2013-08-28 (×2): qty 1
  Filled 2013-08-28: qty 2
  Filled 2013-08-28: qty 1

## 2013-08-28 MED ORDER — ONDANSETRON HCL 4 MG PO TABS: 4.0000 mg | ORAL_TABLET | Freq: Four times a day (QID) | ORAL | Status: DC | PRN

## 2013-08-28 MED ORDER — ACETAMINOPHEN 650 MG RE SUPP: 650.0000 mg | Freq: Four times a day (QID) | RECTAL | Status: DC | PRN

## 2013-08-28 MED ORDER — ALBUTEROL SULFATE (5 MG/ML) 0.5% IN NEBU: 2.5000 mg | INHALATION_SOLUTION | RESPIRATORY_TRACT | Status: DC | PRN

## 2013-08-28 MED ORDER — INSULIN GLARGINE 100 UNIT/ML ~~LOC~~ SOLN
25.0000 [IU] | Freq: Two times a day (BID) | SUBCUTANEOUS | Status: DC
  Administered 2013-08-28 – 2013-08-29 (×4): 25 [IU] via SUBCUTANEOUS
  Filled 2013-08-28 (×5): qty 0.25

## 2013-08-28 MED ORDER — POTASSIUM CHLORIDE CRYS ER 20 MEQ PO TBCR
40.0000 meq | EXTENDED_RELEASE_TABLET | Freq: Two times a day (BID) | ORAL | Status: AC
  Administered 2013-08-28 (×2): 40 meq via ORAL
  Filled 2013-08-28 (×2): qty 2

## 2013-08-28 MED ORDER — FLUOXETINE HCL 20 MG PO CAPS
40.0000 mg | ORAL_CAPSULE | Freq: Every day | ORAL | Status: DC
  Administered 2013-08-28 – 2013-08-30 (×3): 40 mg via ORAL
  Filled 2013-08-28 (×3): qty 2

## 2013-08-28 MED ORDER — ARIPIPRAZOLE 5 MG PO TABS
5.0000 mg | ORAL_TABLET | Freq: Every day | ORAL | Status: DC
  Administered 2013-08-28 – 2013-08-30 (×3): 5 mg via ORAL
  Filled 2013-08-28 (×3): qty 1

## 2013-08-28 MED ORDER — INSULIN ASPART 100 UNIT/ML ~~LOC~~ SOLN
0.0000 [IU] | Freq: Every day | SUBCUTANEOUS | Status: DC
  Administered 2013-08-28: 4 [IU] via SUBCUTANEOUS
  Administered 2013-08-29: 2 [IU] via SUBCUTANEOUS

## 2013-08-28 MED ORDER — INSULIN ASPART 100 UNIT/ML ~~LOC~~ SOLN
0.0000 [IU] | Freq: Three times a day (TID) | SUBCUTANEOUS | Status: DC
  Administered 2013-08-28: 11 [IU] via SUBCUTANEOUS
  Administered 2013-08-28: 3 [IU] via SUBCUTANEOUS
  Administered 2013-08-29: 15 [IU] via SUBCUTANEOUS
  Administered 2013-08-29: 8 [IU] via SUBCUTANEOUS
  Administered 2013-08-29: 11 [IU] via SUBCUTANEOUS
  Administered 2013-08-30: 18 [IU] via SUBCUTANEOUS

## 2013-08-28 MED ORDER — ONDANSETRON HCL 4 MG/2ML IJ SOLN: 4.0000 mg | Freq: Four times a day (QID) | INTRAMUSCULAR | Status: DC | PRN

## 2013-08-28 MED ORDER — FUROSEMIDE 10 MG/ML IJ SOLN
40.0000 mg | Freq: Once | INTRAMUSCULAR | Status: AC
  Administered 2013-08-28: 40 mg via INTRAVENOUS
  Filled 2013-08-28: qty 4

## 2013-08-28 MED ORDER — METHYLPREDNISOLONE SODIUM SUCC 125 MG IJ SOLR
60.0000 mg | Freq: Two times a day (BID) | INTRAMUSCULAR | Status: DC
  Administered 2013-08-28: 60 mg via INTRAVENOUS
  Administered 2013-08-28: 06:00:00 via INTRAVENOUS
  Administered 2013-08-29: 60 mg via INTRAVENOUS
  Filled 2013-08-28 (×5): qty 0.96

## 2013-08-28 MED ORDER — ACETAMINOPHEN 325 MG PO TABS
650.0000 mg | ORAL_TABLET | Freq: Four times a day (QID) | ORAL | Status: DC | PRN
  Administered 2013-08-29: 650 mg via ORAL

## 2013-08-28 MED ORDER — INFLUENZA VAC SPLIT QUAD 0.5 ML IM SUSP
0.5000 mL | INTRAMUSCULAR | Status: DC
  Filled 2013-08-28: qty 0.5

## 2013-08-28 MED ORDER — ENOXAPARIN SODIUM 40 MG/0.4ML ~~LOC~~ SOLN
40.0000 mg | SUBCUTANEOUS | Status: DC
  Administered 2013-08-28: 40 mg via SUBCUTANEOUS
  Administered 2013-08-29: 30 mg via SUBCUTANEOUS
  Filled 2013-08-28 (×4): qty 0.4

## 2013-08-28 MED ORDER — INSULIN ASPART 100 UNIT/ML ~~LOC~~ SOLN
6.0000 [IU] | Freq: Once | SUBCUTANEOUS | Status: AC
  Administered 2013-08-28: 6 [IU] via SUBCUTANEOUS

## 2013-08-28 MED ORDER — ALPRAZOLAM 0.25 MG PO TABS
0.5000 mg | ORAL_TABLET | Freq: Two times a day (BID) | ORAL | Status: DC | PRN
  Administered 2013-08-28 – 2013-08-29 (×3): 0.5 mg via ORAL
  Filled 2013-08-28 (×3): qty 2

## 2013-08-28 MED ORDER — LEVOFLOXACIN 500 MG PO TABS
500.0000 mg | ORAL_TABLET | Freq: Every day | ORAL | Status: DC
  Administered 2013-08-28 – 2013-08-30 (×3): 500 mg via ORAL
  Filled 2013-08-28 (×3): qty 1

## 2013-08-28 MED ORDER — SODIUM CHLORIDE 0.9 % IJ SOLN
3.0000 mL | Freq: Two times a day (BID) | INTRAMUSCULAR | Status: DC
  Administered 2013-08-29: 3 mL via INTRAVENOUS

## 2013-08-28 MED ORDER — DOCUSATE SODIUM 100 MG PO CAPS
100.0000 mg | ORAL_CAPSULE | Freq: Two times a day (BID) | ORAL | Status: DC
  Administered 2013-08-28 – 2013-08-29 (×2): 100 mg via ORAL
  Filled 2013-08-28 (×5): qty 1

## 2013-08-28 MED ORDER — INSULIN GLARGINE 100 UNIT/ML ~~LOC~~ SOLN
30.0000 [IU] | Freq: Every day | SUBCUTANEOUS | Status: DC
  Filled 2013-08-28: qty 0.3

## 2013-08-28 MED ORDER — MORPHINE SULFATE 2 MG/ML IJ SOLN: 2.0000 mg | INTRAMUSCULAR | Status: DC | PRN

## 2013-08-28 MED ORDER — LOSARTAN POTASSIUM 50 MG PO TABS
100.0000 mg | ORAL_TABLET | Freq: Every day | ORAL | Status: DC
  Administered 2013-08-28 – 2013-08-30 (×3): 100 mg via ORAL
  Filled 2013-08-28 (×3): qty 2

## 2013-08-28 MED ORDER — ALBUTEROL SULFATE HFA 108 (90 BASE) MCG/ACT IN AERS: 2.0000 | INHALATION_SPRAY | RESPIRATORY_TRACT | Status: DC | PRN

## 2013-08-28 MED ORDER — INSULIN ASPART 100 UNIT/ML ~~LOC~~ SOLN
0.0000 [IU] | SUBCUTANEOUS | Status: DC
  Administered 2013-08-28: 7 [IU] via SUBCUTANEOUS

## 2013-08-28 NOTE — ED Notes (Signed)
Pt CBG 306 RN notified.

## 2013-08-28 NOTE — Progress Notes (Signed)
  Echocardiogram 2D Echocardiogram has been performed.  Georgian Co 08/28/2013, 12:09 PM

## 2013-08-28 NOTE — Consult Note (Signed)
CARDIOLOGY CONSULT NOTE   Patient ID: [name] MRN: [id number], DOB/AGE: 1961-01-09   Admit date: 08/27/2013 Date of Consult: 08/28/2013   Primary Physician: Elizabeth German, MD Primary Cardiologist: None  Pt. Profile  [name] admitted with chest pain and shortness of breath.  Problem List  Past Medical History  Diagnosis Date  . Diabetes mellitus   . Hypertension   . Anemia   . Chronic back pain   . Neuropathy   . Hypercholesterolemia   . Major depressive disorder, recurrent   . Generalized anxiety disorder   . Panic disorder with agoraphobia   . Tobacco abuse     Past Surgical History  Procedure Laterality Date  . Foot surgery    . Back surgery    . Hernia repair    . Tonsillectomy and adenoidectomy    . Shoulder surgery    . Knee surgery    . Colon surgery    . Breast surgery       Allergies  Allergies  Allergen Reactions  . Celecoxib Nausea Only  . Ibuprofen Nausea Only    HPI   This 52 year old woman was admitted on the morning of 08/28/13 with chest discomfort and shortness of breath.  She states that she had a heart attack in 2007 but did not seek medical attention at that time.  A year later she was seen by Dr. Dickie [name] in 2008 for chest pain and had a Myoview with small area of nonreversible decreased perfusion in the inferolateral wall.  Her ejection fraction was 71%. In 2013 she was seen by Dr. Juanito [name] for chest pain which was thought to be noncardiac and more likely pulmonary in etiology.  She has a history of previous cigarette usage.  She states that her chest discomfort is brought on by exertion and relieved by rest.  She states that she has had a history of a heart murmur. Since admission her cardiac enzymes have been negative and her EKG is nonacute.  Inpatient Medications  . ARIPiprazole  5 mg Oral Daily  . buPROPion  150 mg Oral BID  . docusate sodium  100 mg Oral BID  . enoxaparin (LOVENOX) injection  40 mg Subcutaneous  Q24H  . FLUoxetine  40 mg Oral Daily  . furosemide  40 mg Intravenous Once  . guaiFENesin  600 mg Oral BID  . insulin aspart  0-15 Units Subcutaneous TID WC  . insulin aspart  0-5 Units Subcutaneous QHS  . insulin glargine  25 Units Subcutaneous BID  . levofloxacin  500 mg Oral Daily  . losartan  100 mg Oral Daily  . methylPREDNISolone (SOLU-MEDROL) injection  60 mg Intravenous Q12H  . pantoprazole  40 mg Oral Daily  . potassium chloride  40 mEq Oral BID  . sodium chloride  3 mL Intravenous Q12H  . traZODone  100 mg Oral QHS    Family History Family History  Problem Relation Age of Onset  . Heart disease Mother   . Lung cancer Father     was a former smoker     Social History History   Social History  . Marital Status: Single    Spouse Name: N/A    Number of Children: 2  . Years of Education: N/A   Occupational History  . Disabled    Social History Main Topics  . Smoking status: Former Smoker -- 1.00 packs/day for 20 years    Types: Cigarettes    Quit date: 07/05/2012  .  Smokeless tobacco: Never Used  . Alcohol Use: No  . Drug Use: No  . Sexual Activity: No   Other Topics Concern  . Not on file   Social History Narrative  . No narrative on file     Review of Systems  General:  No chills, fever, night sweats or weight changes.  Cardiovascular:  Positive for chest pain and shortness of breath with exertion  Dermatological: No rash, lesions/masses Respiratory: No cough, dyspnea Urologic: No hematuria, dysuria Abdominal:   No nausea, vomiting, diarrhea, bright red blood per rectum, melena, or hematemesis Neurologic:  No visual changes, wkns, changes in mental status. All other systems reviewed and are otherwise negative except as noted above.  Physical Exam  Blood pressure 129/75, pulse 95, temperature 98.1 F (36.7 C), temperature source Oral, resp. rate 20, height 5\' 2"  (1.575 m), weight 215 lb 9.8 oz (97.8 kg), SpO2 98.00%.  General: Pleasant,  NAD Psych: Normal affect. Neuro: Alert and oriented X 3. Moves all extremities spontaneously. HEENT: Normal  Neck: Supple without bruits or JVD. Lungs: There are a few inspiratory rales at the right base Heart: RRR no s3, s4, there is a soft apical systolic murmur. Abdomen: Soft, non-tender, non-distended, BS + x 4.  Extremities: No clubbing, cyanosis or edema. DP/PT/Radials 2+ and equal bilaterally.  Labs   Recent Labs  08/28/13 0530  TROPONINI <0.30   Lab Results  Component Value Date   WBC 9.3 08/28/2013   HGB 11.6* 08/28/2013   HCT 35.4* 08/28/2013   MCV 88.5 08/28/2013   PLT 250 08/28/2013     Recent Labs Lab 08/28/13 0530  NA 133*  K 3.4*  CL 92*  CO2 29  BUN 12  CREATININE 0.80  CALCIUM 8.2*  PROT 6.6  BILITOT 0.6  ALKPHOS 117  ALT 8  AST 14  GLUCOSE 340*   Lab Results  Component Value Date   CHOL  Value: 176        ATP III CLASSIFICATION:  <200     mg/dL   Desirable  409-811  mg/dL   Borderline High  >=914    mg/dL   High        7/82/9562   HDL 57 03/21/2010   LDLCALC  Value: 103        Total Cholesterol/HDL:CHD Risk Coronary Heart Disease Risk Table                     Men   Women  1/2 Average Risk   3.4   3.3  Average Risk       5.0   4.4  2 X Average Risk   9.6   7.1  3 X Average Risk  23.4   11.0        Use the calculated Patient Ratio above and the CHD Risk Table to determine the patient's CHD Risk.        ATP III CLASSIFICATION (LDL):  <100     mg/dL   Optimal  130-865  mg/dL   Near or Above                    Optimal  130-159  mg/dL   Borderline  784-696  mg/dL   High  >295     mg/dL   Very High* 2/84/1324   TRIG 80 03/21/2010   Lab Results  Component Value Date   DDIMER 0.42 07/06/2012    Radiology/Studies  Dg Chest Portable 1 View  08/27/2013   CLINICAL DATA:  Chest pain, shortness of breath and weakness.  EXAM: PORTABLE CHEST - 1 VIEW  COMPARISON:  07/06/2012.  FINDINGS: The cardiac silhouette remains mildly enlarged. The interstitial markings  remain mildly prominent. A interval small amount of bilateral linear density. Left humeral head prosthesis and cervical spine fixation hardware are again demonstrated.  IMPRESSION: 1. Interval small amount of bilateral linear atelectasis or scarring. 2. Stable mild cardiomegaly and mild chronic interstitial lung disease.   Electronically Signed   By: Gordan Payment M.D.   On: 08/27/2013 19:55    ECG  Normal sinus rhythm.  No ischemic changes.  2-D echo: - Left ventricle: The cavity size was normal. Wall thickness was normal. Systolic function was normal. The estimated ejection fraction was in the range of 50% to 55%. Wall motion was normal; there were no regional wall motion abnormalities. - Mitral valve: The findings are consistent with mild stenosis.   ASSESSMENT AND PLAN 1. exertional chest pain and dyspnea rule out ischemic heart disease.  Multiple risk factors present. 2. mild mitral stenosis.  She gives a history of possible rheumatic heart disease at age 24.  She does not have left atrial enlargement on echo. 3. hypertensive cardiovascular disease 4. diabetes mellitus type 2 5. COPD exacerbation  Plan: Will arrange for a Lexiscan  Myoview stress test    Signed, Cassell Clement,  MD 08/28/2013, 2:22 PM

## 2013-08-28 NOTE — Progress Notes (Signed)
CBG-442 pt with no complaints. [facility] hospitalist made aware of CBG orders received. Will recheck CBG in 30 min. Will continue to reassess pt.

## 2013-08-28 NOTE — H&P (Signed)
PCP: [name], MD    Chief Complaint:  Chest pain and shortness of breath  HPI: [name] is a 52 y.o. female   has a past medical history of Diabetes mellitus; Hypertension; Anemia; Chronic back pain; Neuropathy; Hypercholesterolemia; Major depressive disorder, recurrent; Generalized anxiety disorder; Panic disorder with agoraphobia; and Tobacco abuse.   Presented with  Reports hx of chest pain anytime she walks or does any activity. Chest pain goes away when she is resting. Patient reports she have had similar pain with her prior MI. She also have had some nausea and wheezing associated with this. She endorses chest pain when she swallows cold liquids as well but states it is a different sensation. She quit smoking 14 months ago.  She has been seen by Dr. Juanda Chance in 2008 for  chest pain and had  Myoview with small area of nonreversible decreased perfusion in the inferolateral wall,   EF 71%. She has been seen by Dr. Daleen Squibb again in 2013 and though to have non cardiac chest pain but more likely pulmonary etiology.   hospitalist had been called for her admission   Review of Systems:    Pertinent positives include:  chest pain,  Chills, nausea, shortness of breath at rest.   dyspnea on exertion,  non-productive cough,  Constitutional:  No weight loss, night sweats, Fevers, fatigue, weight loss  HEENT:  No headaches, Difficulty swallowing,Tooth/dental problems,Sore throat,  No sneezing, itching, ear ache, nasal congestion, post nasal drip,  Cardio-vascular:  NoOrthopnea, PND, anasarca, dizziness, palpitations.no Bilateral lower extremity swelling  GI:  No heartburn, indigestion, abdominal pain, vomiting, diarrhea, change in bowel habits, loss of appetite, melena, blood in stool, hematemesis Resp:  no  No excess mucus, no productive cough, NoNo coughing up of blood.No change in color of mucus.No wheezing. Skin:  no rash or lesions. No jaundice GU:  no dysuria, change in color  of urine, no urgency or frequency. No straining to urinate.  No flank pain.  Musculoskeletal:  No joint pain or no joint swelling. No decreased range of motion. No back pain.  Psych:  No change in mood or affect. No depression or anxiety. No memory loss.  Neuro: no localizing neurological complaints, no tingling, no weakness, no double vision, no gait abnormality, no slurred speech, no confusion  Otherwise ROS are negative except for above, 10 systems were reviewed  Past Medical History: Past Medical History  Diagnosis Date  . Diabetes mellitus   . Hypertension   . Anemia   . Chronic back pain   . Neuropathy   . Hypercholesterolemia   . Major depressive disorder, recurrent   . Generalized anxiety disorder   . Panic disorder with agoraphobia   . Tobacco abuse    Past Surgical History  Procedure Laterality Date  . Foot surgery    . Back surgery    . Hernia repair    . Tonsillectomy and adenoidectomy    . Shoulder surgery    . Knee surgery    . Colon surgery    . Breast surgery       Medications: Prior to Admission medications   Medication Sig Start Date End Date Taking? Authorizing Provider  ALPRAZolam Prudy Feeler) 0.5 MG tablet Take 1 tablet (0.5 mg total) by mouth 2 (two) times daily as needed. For anxiety 08/04/11  Yes Simbiso Ranga, MD  ARIPiprazole (ABILIFY) 5 MG tablet Take 5 mg by mouth daily.   Yes Historical Provider, MD  buPROPion (WELLBUTRIN SR) 150 MG 12  hr tablet Take 150 mg by mouth 2 (two) times daily.   Yes Historical Provider, MD  HYDROCHLOROTHIAZIDE PO Take 1 tablet by mouth daily.   Yes Historical Provider, MD  insulin aspart (NOVOLOG) 100 UNIT/ML injection Inject 8 Units into the skin 3 (three) times daily as needed. If CBG > 100   Yes Historical Provider, MD  insulin glargine (LANTUS) 100 UNIT/ML injection Inject 40 Units into the skin at bedtime.    Yes Historical Provider, MD  losartan (COZAAR) 100 MG tablet Take 1 tablet (100mg ) by mouth daily 09/11/12   Yes Jessica A Hope, PA-C  omeprazole (PRILOSEC) 20 MG capsule Take 1 capsule 30-60 minutes before first meal of the day 10/12/12  Yes Nyoka Cowden, MD  PROAIR HFA 108 [phone] BASE) MCG/ACT inhaler Inhale 2 puffs into the lungs every 6 hours as needed for wheezing or shortness of breath 09/11/12  Yes Jessica A Hope, PA-C  traZODone (DESYREL) 100 MG tablet Take 100 mg by mouth at bedtime.    Yes Historical Provider, MD  FLUoxetine (PROZAC) 40 MG capsule Take 1 capsule (40 mg total) by mouth daily. 08/17/11 08/16/12  Zannie Cove, MD    Allergies:   Allergies  Allergen Reactions  . Celecoxib Nausea Only  . Ibuprofen Nausea Only    Social History:  Ambulatory  independently   Lives at home with family   reports that she quit smoking about 13 months ago. Her smoking use included Cigarettes. She has a 20 pack-year smoking history. She has never used smokeless tobacco. She reports that she does not drink alcohol or use illicit drugs.   Family History: family history includes Heart disease in her mother; Lung cancer in her father.    Physical Exam: Patient Vitals for the past 24 hrs:  BP Temp Pulse Resp SpO2 Height Weight  08/28/13 0145 114/50 mmHg - 77 16 100 % - -  08/28/13 0130 109/60 mmHg - 79 17 98 % - -  08/28/13 0115 115/88 mmHg - 83 17 98 % - -  08/28/13 0100 122/67 mmHg - 81 15 97 % - -  08/28/13 0045 112/76 mmHg - 84 16 97 % - -  08/28/13 0030 78/36 mmHg - 85 21 97 % - -  08/28/13 0015 95/44 mmHg - 86 28 95 % - -  08/28/13 0000 90/41 mmHg - 83 16 99 % - -  08/27/13 2345 117/64 mmHg - 83 17 97 % - -  08/27/13 2330 126/56 mmHg - 83 15 98 % - -  08/27/13 2315 106/56 mmHg - 81 16 100 % - -  08/27/13 2300 110/62 mmHg - 85 14 98 % - -  08/27/13 2245 129/48 mmHg - 87 23 99 % - -  08/27/13 2145 91/51 mmHg - 89 14 98 % - -  08/27/13 2100 114/64 mmHg - 80 13 100 % - -  08/27/13 1945 107/58 mmHg - 77 19 92 % - -  08/27/13 1915 113/58 mmHg - 83 14 90 % - -  08/27/13 1912 116/55  mmHg 98.7 F (37.1 C) 84 18 93 % 5\' 2"  (1.575 m) 92.987 kg (205 lb)  08/27/13 1909 - - - - 94 % - -    1. General:  in No Acute distress, older appearing when her stated age 32. Psychological: Alert and  Oriented 3. Head/ENT:    Dry Mucous Membranes  Head Non traumatic, neck supple                            Poor Dentition 4. SKIN: normal   Skin turgor,  Skin clean Dry and intact no rash 5. Heart: Regular rate and rhythm no Murmur, Rub or gallop 6. Lungs:  wheezes bilaterally no significant crackles   7. Abdomen: Soft, non-tender, Non distended 8. Lower extremities: no clubbing, cyanosis, or edema 9. Neurologically Grossly intact, moving all 4 extremities equally 10. MSK: Normal range of motion  body mass index is 37.49 kg/(m^2).   Labs on Admission:   Recent Labs  08/27/13 1947 08/27/13 2029  NA 133* 135  K 7.6* 3.9  CL 96 94*  CO2  --  27  GLUCOSE 107* 120*  BUN 15 11  CREATININE 1.10 0.81  CALCIUM  --  8.1*    Recent Labs  08/27/13 2029  AST 21  ALT 10  ALKPHOS 109  BILITOT 0.4  PROT 6.4  ALBUMIN 3.2*   No results found for this basename: LIPASE, AMYLASE,  in the last 72 hours  Recent Labs  08/27/13 1926 08/27/13 1947  WBC 9.2  --   HGB 11.4* 12.9  HCT 34.9* 38.0  MCV 89.5  --   PLT 266  --    No results found for this basename: CKTOTAL, CKMB, CKMBINDEX, TROPONINI,  in the last 72 hours No results found for this basename: TSH, T4TOTAL, FREET3, T3FREE, THYROIDAB,  in the last 72 hours No results found for this basename: VITAMINB12, FOLATE, FERRITIN, TIBC, IRON, RETICCTPCT,  in the last 72 hours Lab Results  Component Value Date   HGBA1C 8.8* 07/23/2011    Estimated Creatinine Clearance: 84.4 ml/min (by C-G formula based on Cr of 0.81). ABG    Component Value Date/Time   HCO3 27.9* 08/11/2011 0855   TCO2 33 08/27/2013 1947   O2SAT 49.3 08/11/2011 0855     Lab Results  Component Value Date   DDIMER 0.42 07/06/2012      Other results:  I have pearsonaly reviewed this: ECG REPORT  Rate: 84  Rhythm: NSR ST&T Change: non sichemic Cultures:    Component Value Date/Time   SDES URINE, CLEAN CATCH 08/11/2011 0806   SPECREQUEST Normal 08/11/2011 0806   CULT Multiple bacterial morphotypes present, none predominant. Suggest appropriate recollection if clinically indicated. 08/11/2011 0806   REPTSTATUS 08/12/2011 FINAL 08/11/2011 0806       Radiological Exams on Admission: Dg Chest Portable 1 View  08/27/2013   CLINICAL DATA:  Chest pain, shortness of breath and weakness.  EXAM: PORTABLE CHEST - 1 VIEW  COMPARISON:  07/06/2012.  FINDINGS: The cardiac silhouette remains mildly enlarged. The interstitial markings remain mildly prominent. A interval small amount of bilateral linear density. Left humeral head prosthesis and cervical spine fixation hardware are again demonstrated.  IMPRESSION: 1. Interval small amount of bilateral linear atelectasis or scarring. 2. Stable mild cardiomegaly and mild chronic interstitial lung disease.   Electronically Signed   By: Gordan Payment M.D.   On: 08/27/2013 19:55    Chart has been reviewed  Assessment/Plan  52 year old lady with history of coronary artery disease here with chest pain and wheezing  Present on Admission:  . Chest pain on exertion - will admit cycle cardiac markers watch in telemetry obtain serial EKG keep n.p.o. in case she needed cardiac study  . HTN (hypertension) - continue home medications  . Diabetes type  2, controlled - slightly low dose of Lantus given patient will be n.p.o. for possible cardiac studies  . COPD exacerbation - when necessary albuterol scheduled Atrovent will start Levaquin and Solu-Medrol 60 mg twice daily   Prophylaxis:   Lovenox, Protonix  CODE STATUS: FULL CODE  Other plan as per orders.  I have spent a total of 55 min on this admission  Kenton Fortin 08/28/2013, 2:39 AM

## 2013-08-28 NOTE — Progress Notes (Signed)
52yo female beginning on PO ABX for COPD exacerbation, ordered appropriately by MD as Levaquin 500mg  daily for CrCl ~85 ml/min.  Vernard Gambles, PharmD, BCPS 08/28/2013 4:50 AM

## 2013-08-28 NOTE — ED Notes (Signed)
Ambulated to the BR without difficulty.  On the way back to the room she stated that she was getting SOB.  Back to bed RA sat 93%.  Requesting something to eat.

## 2013-08-28 NOTE — Progress Notes (Signed)
[facility]                                                                                Patient Demographics  [name], is a 52 y.o. female, DOB - Dec 13, 1960, [id number]  Admit date - 08/27/2013   Admitting Physician Therisa Doyne, MD  Outpatient Primary MD for the patient is [name], MD  LOS - 1   Chief Complaint  Patient presents with  . Chest Pain        Assessment & Plan    1. Shortness of breath likely due to COPD exacerbation. Currently is having wheezing but also some rales, we'll check pro BNP and echogram, continue IV steroids, will continue Levaquin for atypical coverage and COPD exacerbation, give her trial of Lasix. Oxygen and nebulizer treatments as needed and monitor.    2. Chest pain. History of CAD. Recent stress test shows small reversible ischemia, cycle troponins which are so far negative, EKG is nonacute, she is now chest pain-free, we'll place on aspirin, bisoprolol, request cardiology to see the patient.     3. Diabetes mellitus type 2 in poor control. Check A1c, have adjusted dose Lantus and short-acting sliding scale.  Lab Results  Component Value Date   HGBA1C 8.8* 07/23/2011    CBG (last 3)   Recent Labs  08/28/13 0328 08/28/13 0509 08/28/13 1118  GLUCAP 306* 326* 194*     4. Hypokalemia replace and recheck.    5. GERD on PPI continue.    6. Depression anxiety home  psych medications continued      Code Status: Full  Family Communication:    Disposition Plan: Home   Procedures Echo, possible Stress Test   Consults  Cards   Medications  Scheduled Meds: . ARIPiprazole  5 mg Oral Daily  . buPROPion  150 mg Oral BID  . docusate sodium  100 mg Oral BID  . enoxaparin (LOVENOX) injection  40 mg Subcutaneous Q24H  . FLUoxetine  40 mg Oral Daily  . furosemide  40 mg Intravenous Once  . guaiFENesin  600 mg Oral BID  . insulin aspart  0-15 Units Subcutaneous TID WC  . insulin aspart  0-5  Units Subcutaneous QHS  . insulin glargine  25 Units Subcutaneous BID  . ipratropium  0.5 mg Nebulization Q6H  . levofloxacin  500 mg Oral Daily  . losartan  100 mg Oral Daily  . methylPREDNISolone (SOLU-MEDROL) injection  60 mg Intravenous Q12H  . pantoprazole  40 mg Oral Daily  . potassium chloride  40 mEq Oral BID  . sodium chloride  3 mL Intravenous Q12H  . traZODone  100 mg Oral QHS   Continuous Infusions:  PRN Meds:.acetaminophen, acetaminophen, albuterol, albuterol, ALPRAZolam, guaiFENesin-dextromethorphan, HYDROcodone-acetaminophen, morphine injection, nitroGLYCERIN, ondansetron (ZOFRAN) IV, ondansetron  DVT Prophylaxis  Lovenox   Lab Results  Component Value Date   PLT 250 08/28/2013    Antibiotics     Anti-infectives   Start     Dose/Rate Route Frequency Ordered Stop   08/28/13 1000  levofloxacin (LEVAQUIN) tablet 500 mg     500 mg Oral Daily 08/28/13 0443  Subjective:   [name] today has, No headache, No chest pain, No abdominal pain - No Nausea, No new weakness tingling or numbness, No Cough - improved SOB.   Objective:   Filed Vitals:   08/28/13 0230 08/28/13 0300 08/28/13 0354 08/28/13 0453  BP: 133/94 100/81 144/86   Pulse: 82 83 85   Temp:   97.8 F (36.6 C)   TempSrc:   Oral   Resp: 19 16 18    Height:   5\' 2"  (1.575 m)   Weight:   97.8 kg (215 lb 9.8 oz)   SpO2: 93% 99% 98% 97%    Wt Readings from Last 3 Encounters:  08/28/13 97.8 kg (215 lb 9.8 oz)  07/06/13 91.627 kg (202 lb)  07/30/12 79.652 kg (175 lb 9.6 oz)    No intake or output data in the 24 hours ending 08/28/13 1219  Exam Awake Alert, Oriented X 3, No new F.N deficits, Normal affect Quail.AT,PERRAL Supple Neck,No JVD, No cervical lymphadenopathy appriciated.  Symmetrical Chest wall movement, Good air movement bilaterally, CTAB RRR,No Gallops,Rubs or new Murmurs, No Parasternal Heave +ve B.Sounds, Abd Soft, Non tender, No organomegaly appriciated, No rebound -  guarding or rigidity. No Cyanosis, Clubbing or edema, No new Rash or bruise     Data Review   Micro Results No results found for this or any previous visit (from the past 240 hour(s)).  Radiology Reports Dg Chest Portable 1 View  08/27/2013   CLINICAL DATA:  Chest pain, shortness of breath and weakness.  EXAM: PORTABLE CHEST - 1 VIEW  COMPARISON:  07/06/2012.  FINDINGS: The cardiac silhouette remains mildly enlarged. The interstitial markings remain mildly prominent. A interval small amount of bilateral linear density. Left humeral head prosthesis and cervical spine fixation hardware are again demonstrated.  IMPRESSION: 1. Interval small amount of bilateral linear atelectasis or scarring. 2. Stable mild cardiomegaly and mild chronic interstitial lung disease.   Electronically Signed   By: Gordan Payment M.D.   On: 08/27/2013 19:55    CBC  Recent Labs Lab 08/27/13 1926 08/27/13 1947 08/28/13 0530  WBC 9.2  --  9.3  HGB 11.4* 12.9 11.6*  HCT 34.9* 38.0 35.4*  PLT 266  --  250  MCV 89.5  --  88.5  MCH 29.2  --  29.0  MCHC 32.7  --  32.8  RDW 14.6  --  14.2    Chemistries   Recent Labs Lab 08/27/13 1947 08/27/13 2029 08/28/13 0530  NA 133* 135 133*  K 7.6* 3.9 3.4*  CL 96 94* 92*  CO2  --  27 29  GLUCOSE 107* 120* 340*  BUN 15 11 12   CREATININE 1.10 0.81 0.80  CALCIUM  --  8.1* 8.2*  MG  --   --  2.0  AST  --  21 14  ALT  --  10 8  ALKPHOS  --  109 117  BILITOT  --  0.4 0.6   ------------------------------------------------------------------------------------------------------------------ estimated creatinine clearance is 87.8 ml/min (by C-G formula based on Cr of 0.8). ------------------------------------------------------------------------------------------------------------------ No results found for this basename: HGBA1C,  in the last 72 hours ------------------------------------------------------------------------------------------------------------------ No  results found for this basename: CHOL, HDL, LDLCALC, TRIG, CHOLHDL, LDLDIRECT,  in the last 72 hours ------------------------------------------------------------------------------------------------------------------ No results found for this basename: TSH, T4TOTAL, FREET3, T3FREE, THYROIDAB,  in the last 72 hours ------------------------------------------------------------------------------------------------------------------ No results found for this basename: VITAMINB12, FOLATE, FERRITIN, TIBC, IRON, RETICCTPCT,  in the last 72 hours  Coagulation profile No results  found for this basename: INR, PROTIME,  in the last 168 hours  No results found for this basename: DDIMER,  in the last 72 hours  Cardiac Enzymes  Recent Labs Lab 08/28/13 0530  TROPONINI <0.30   ------------------------------------------------------------------------------------------------------------------ No components found with this basename: POCBNP,      Time Spent in minutes  35   Linnea Todisco K M.D on 08/28/2013 at 12:19 PM  Between 7am to 7pm - Pager - [phone]  After 7pm go to [email or web address] - password [email or web address]  And look for the night coverage person covering for me after hours  [facility] Office  [phone]

## 2013-08-29 ENCOUNTER — Other Ambulatory Visit (HOSPITAL_COMMUNITY): Payer: Self-pay

## 2013-08-29 ENCOUNTER — Inpatient Hospital Stay (HOSPITAL_COMMUNITY): Payer: Medicaid Other

## 2013-08-29 DIAGNOSIS — R079 Chest pain, unspecified: Secondary | ICD-10-CM

## 2013-08-29 LAB — GLUCOSE, CAPILLARY
Glucose-Capillary: 204 mg/dL — ABNORMAL HIGH (ref 70–99)
Glucose-Capillary: 292 mg/dL — ABNORMAL HIGH (ref 70–99)
Glucose-Capillary: 293 mg/dL — ABNORMAL HIGH (ref 70–99)

## 2013-08-29 LAB — POTASSIUM: Potassium: 4.6 mEq/L (ref 3.5–5.1)

## 2013-08-29 LAB — T4: T4, Total: 3.9 ug/dL — ABNORMAL LOW (ref 5.0–12.5)

## 2013-08-29 MED ORDER — TECHNETIUM TC 99M SESTAMIBI - CARDIOLITE
30.0000 | Freq: Once | INTRAVENOUS | Status: AC | PRN
  Administered 2013-08-29: 30 via INTRAVENOUS

## 2013-08-29 MED ORDER — ACETAMINOPHEN 325 MG PO TABS
ORAL_TABLET | ORAL | Status: AC
  Filled 2013-08-29: qty 2

## 2013-08-29 MED ORDER — FUROSEMIDE 40 MG PO TABS
40.0000 mg | ORAL_TABLET | Freq: Once | ORAL | Status: AC
  Administered 2013-08-29: 40 mg via ORAL
  Filled 2013-08-29: qty 1

## 2013-08-29 MED ORDER — INSULIN ASPART 100 UNIT/ML ~~LOC~~ SOLN: 4.0000 [IU] | Freq: Three times a day (TID) | SUBCUTANEOUS | Status: DC

## 2013-08-29 MED ORDER — TECHNETIUM TC 99M SESTAMIBI - CARDIOLITE
10.0000 | Freq: Once | INTRAVENOUS | Status: AC | PRN
  Administered 2013-08-29: 10 via INTRAVENOUS

## 2013-08-29 MED ORDER — PREDNISONE 50 MG PO TABS
50.0000 mg | ORAL_TABLET | Freq: Every day | ORAL | Status: DC
  Administered 2013-08-30: 50 mg via ORAL
  Filled 2013-08-29 (×2): qty 1

## 2013-08-29 MED ORDER — INSULIN ASPART 100 UNIT/ML ~~LOC~~ SOLN
7.0000 [IU] | Freq: Three times a day (TID) | SUBCUTANEOUS | Status: DC
  Administered 2013-08-29: 7 [IU] via SUBCUTANEOUS

## 2013-08-29 MED ORDER — LEVOTHYROXINE SODIUM 50 MCG PO TABS
50.0000 ug | ORAL_TABLET | Freq: Every day | ORAL | Status: DC
  Administered 2013-08-29 – 2013-08-30 (×2): 50 ug via ORAL
  Filled 2013-08-29 (×3): qty 1

## 2013-08-29 MED ORDER — REGADENOSON 0.4 MG/5ML IV SOLN
INTRAVENOUS | Status: AC
  Administered 2013-08-29: 0.4 mg
  Filled 2013-08-29: qty 5

## 2013-08-29 NOTE — Progress Notes (Addendum)
[facility]                                                                                Patient Demographics  [name], is a 52 y.o. female, DOB - 1961-02-18, [id number]  Admit date - 08/27/2013   Admitting Physician Therisa Doyne, MD  Outpatient Primary MD for the patient is [name], MD  LOS - 2   Chief Complaint  Patient presents with  . Chest Pain        Assessment & Plan    1. Shortness of breath likely due to COPD exacerbation. Currently is having wheezing but also some rales, or BNP was slightly elevated however echogram shows a preserved EF of 55% with no LVH probably has nonspecific fluid overload, she has responded well to Lasix and IV steroids, switched to oral prednisone and will continue Levaquin for atypical coverage and COPD exacerbation, Oxygen and nebulizer treatments as needed and monitor.    2. Chest pain. History of CAD. Recent stress test shows small reversible ischemia, negative troponins, EKG is nonacute, she is now chest pain-free, we'll place on aspirin, bisoprolol, cardiology seeing the patient patient is scheduled for a stress test on 08/29/2013.    3. Diabetes mellitus type 2 in poor control. Check A1c, have adjusted dose Lantus and short-acting sliding scale. Glycemic control poor secondary to steroids, have lowered steroid dose and adjusted pre-meal NovoLog.  Lab Results  Component Value Date   HGBA1C 8.9* 08/28/2013    CBG (last 3)   Recent Labs  08/28/13 2153 08/29/13 0026 08/29/13 0629  GLUCAP 314* 292* 379*     4. Hypokalemia replace and stable.    5. GERD on PPI continue.    6. Depression anxiety home  psych medications continued    7. Hypothyroidism. Undiagnosed previously. TSH is in excess of 35. Placed on Synthroid, repeat TSH, T4 and free T3  Lab Results  Component Value Date   TSH 34.809* 08/28/2013      Code Status: Full  Family Communication:    Disposition Plan:  Home   Procedures Echo, possible Stress Test   Consults  Cards   Medications  Scheduled Meds: . ARIPiprazole  5 mg Oral Daily  . buPROPion  150 mg Oral BID  . docusate sodium  100 mg Oral BID  . enoxaparin (LOVENOX) injection  40 mg Subcutaneous Q24H  . FLUoxetine  40 mg Oral Daily  . furosemide  40 mg Oral Once  . guaiFENesin  600 mg Oral BID  . influenza vac split quadrivalent PF  0.5 mL Intramuscular Tomorrow-1000  . insulin aspart  0-15 Units Subcutaneous TID WC  . insulin aspart  0-5 Units Subcutaneous QHS  . insulin aspart  4 Units Subcutaneous TID WC  . insulin glargine  25 Units Subcutaneous BID  . levofloxacin  500 mg Oral Daily  . levothyroxine  50 mcg Oral QAC breakfast  . losartan  100 mg Oral Daily  . pantoprazole  40 mg Oral Daily  . [START ON 08/30/2013] predniSONE  50 mg Oral Q breakfast  . sodium chloride  3 mL Intravenous Q12H  . traZODone  100 mg Oral QHS   Continuous  Infusions:  PRN Meds:.acetaminophen, acetaminophen, albuterol, albuterol, ALPRAZolam, guaiFENesin-dextromethorphan, HYDROcodone-acetaminophen, morphine injection, nitroGLYCERIN, ondansetron (ZOFRAN) IV, ondansetron  DVT Prophylaxis  Lovenox   Lab Results  Component Value Date   PLT 250 08/28/2013    Antibiotics     Anti-infectives   Start     Dose/Rate Route Frequency Ordered Stop   08/28/13 1000  levofloxacin (LEVAQUIN) tablet 500 mg     500 mg Oral Daily 08/28/13 0443            Subjective:   [name] today has, No headache, No chest pain, No abdominal pain - No Nausea, No new weakness tingling or numbness, No Cough - improved SOB.   Objective:   Filed Vitals:   08/28/13 0453 08/28/13 1258 08/28/13 2020 08/29/13 0405  BP:  129/75 130/88 125/77  Pulse:  95 98 96  Temp:  98.1 F (36.7 C) 98.3 F (36.8 C) 97.7 F (36.5 C)  TempSrc:  Oral Oral Oral  Resp:  20 20 20   Height:      Weight:      SpO2: 97% 98% 99% 95%    Wt Readings from Last 3 Encounters:   08/28/13 97.8 kg (215 lb 9.8 oz)  07/06/13 91.627 kg (202 lb)  07/30/12 79.652 kg (175 lb 9.6 oz)     Intake/Output Summary (Last 24 hours) at 08/29/13 0932 Last data filed at 08/29/13 0600  Gross per 24 hour  Intake    240 ml  Output   1750 ml  Net  -1510 ml    Exam Awake Alert, Oriented X 3, No new F.N deficits, Normal affect .AT,PERRAL Supple Neck,No JVD, No cervical lymphadenopathy appriciated.  Symmetrical Chest wall movement, Good air movement bilaterally, CTAB RRR,No Gallops,Rubs or new Murmurs, No Parasternal Heave +ve B.Sounds, Abd Soft, Non tender, No organomegaly appriciated, No rebound - guarding or rigidity. No Cyanosis, Clubbing or edema, No new Rash or bruise     Data Review   Micro Results No results found for this or any previous visit (from the past 240 hour(s)).  Radiology Reports Dg Chest Portable 1 View  08/27/2013   CLINICAL DATA:  Chest pain, shortness of breath and weakness.  EXAM: PORTABLE CHEST - 1 VIEW  COMPARISON:  07/06/2012.  FINDINGS: The cardiac silhouette remains mildly enlarged. The interstitial markings remain mildly prominent. A interval small amount of bilateral linear density. Left humeral head prosthesis and cervical spine fixation hardware are again demonstrated.  IMPRESSION: 1. Interval small amount of bilateral linear atelectasis or scarring. 2. Stable mild cardiomegaly and mild chronic interstitial lung disease.   Electronically Signed   By: Gordan Payment M.D.   On: 08/27/2013 19:55    CBC  Recent Labs Lab 08/27/13 1926 08/27/13 1947 08/28/13 0530  WBC 9.2  --  9.3  HGB 11.4* 12.9 11.6*  HCT 34.9* 38.0 35.4*  PLT 266  --  250  MCV 89.5  --  88.5  MCH 29.2  --  29.0  MCHC 32.7  --  32.8  RDW 14.6  --  14.2    Chemistries   Recent Labs Lab 08/27/13 1947 08/27/13 2029 08/28/13 0530 08/29/13 0520  NA 133* 135 133*  --   K 7.6* 3.9 3.4* 4.6  CL 96 94* 92*  --   CO2  --  27 29  --   GLUCOSE 107* 120* 340*  --   BUN  15 11 12   --   CREATININE 1.10 0.81 0.80  --   CALCIUM  --  8.1* 8.2*  --   MG  --   --  2.0  --   AST  --  21 14  --   ALT  --  10 8  --   ALKPHOS  --  109 117  --   BILITOT  --  0.4 0.6  --    ------------------------------------------------------------------------------------------------------------------ estimated creatinine clearance is 87.8 ml/min (by C-G formula based on Cr of 0.8). ------------------------------------------------------------------------------------------------------------------  Recent Labs  08/28/13 0530  HGBA1C 8.9*   ------------------------------------------------------------------------------------------------------------------ No results found for this basename: CHOL, HDL, LDLCALC, TRIG, CHOLHDL, LDLDIRECT,  in the last 72 hours ------------------------------------------------------------------------------------------------------------------  Recent Labs  08/28/13 0530  TSH 34.809*   ------------------------------------------------------------------------------------------------------------------ No results found for this basename: VITAMINB12, FOLATE, FERRITIN, TIBC, IRON, RETICCTPCT,  in the last 72 hours  Coagulation profile No results found for this basename: INR, PROTIME,  in the last 168 hours  No results found for this basename: DDIMER,  in the last 72 hours  Cardiac Enzymes  Recent Labs Lab 08/28/13 0530 08/28/13 1400  TROPONINI <0.30 <0.30   ------------------------------------------------------------------------------------------------------------------ No components found with this basename: POCBNP,      Time Spent in minutes  35   Susa Raring K M.D on 08/29/2013 at 9:32 AM  Between 7am to 7pm - Pager - [phone]  After 7pm go to [email or web address] - password [email or web address]  And look for the night coverage person covering for me after hours  [facility] Office  [phone]

## 2013-08-29 NOTE — Progress Notes (Signed)
Patient Name: [name] Date of Encounter: 08/29/2013     Active Problems:   HTN (hypertension)   Chest pain on exertion   Diabetes type 2, controlled   Chest pain   COPD exacerbation    SUBJECTIVE  No further severe chest pain. Had myoview this am, results pending. TSH very high suggests new Dx of hypothyroidism.  CURRENT MEDS . acetaminophen      . ARIPiprazole  5 mg Oral Daily  . buPROPion  150 mg Oral BID  . docusate sodium  100 mg Oral BID  . enoxaparin (LOVENOX) injection  40 mg Subcutaneous Q24H  . FLUoxetine  40 mg Oral Daily  . furosemide  40 mg Oral Once  . guaiFENesin  600 mg Oral BID  . influenza vac split quadrivalent PF  0.5 mL Intramuscular Tomorrow-1000  . insulin aspart  0-15 Units Subcutaneous TID WC  . insulin aspart  0-5 Units Subcutaneous QHS  . insulin aspart  7 Units Subcutaneous TID WC  . insulin glargine  25 Units Subcutaneous BID  . levofloxacin  500 mg Oral Daily  . levothyroxine  50 mcg Oral QAC breakfast  . losartan  100 mg Oral Daily  . pantoprazole  40 mg Oral Daily  . [START ON 08/30/2013] predniSONE  50 mg Oral Q breakfast  . sodium chloride  3 mL Intravenous Q12H  . traZODone  100 mg Oral QHS    OBJECTIVE  Filed Vitals:   08/29/13 0405 08/29/13 1013 08/29/13 1015 08/29/13 1017  BP: 125/77 113/60 129/65 128/66  Pulse: 96 93 96 96  Temp: 97.7 F (36.5 C)     TempSrc: Oral     Resp: 20     Height:      Weight:      SpO2: 95%       Intake/Output Summary (Last 24 hours) at 08/29/13 1145 Last data filed at 08/29/13 0600  Gross per 24 hour  Intake      0 ml  Output   1450 ml  Net  -1450 ml   Filed Weights   08/27/13 1912 08/28/13 0354  Weight: 205 lb (92.987 kg) 215 lb 9.8 oz (97.8 kg)    PHYSICAL EXAM  General: Pleasant, NAD. Neuro: Alert and oriented X 3. Moves all extremities spontaneously. Psych: Normal affect. HEENT:  Normal  Neck: Supple without bruits or JVD. Lungs:  Resp regular and unlabored,  CTA. Heart: RRR no s3, s4, soft apical systolic murmur. Abdomen: Soft, non-tender, non-distended, BS + x 4.  Extremities: No clubbing, cyanosis or edema. DP/PT/Radials 2+ and equal bilaterally.  Accessory Clinical Findings  CBC  Recent Labs  08/27/13 1926 08/27/13 1947 08/28/13 0530  WBC 9.2  --  9.3  HGB 11.4* 12.9 11.6*  HCT 34.9* 38.0 35.4*  MCV 89.5  --  88.5  PLT 266  --  250   Basic Metabolic Panel  Recent Labs  08/27/13 2029 08/28/13 0530 08/29/13 0520  NA 135 133*  --   K 3.9 3.4* 4.6  CL 94* 92*  --   CO2 27 29  --   GLUCOSE 120* 340*  --   BUN 11 12  --   CREATININE 0.81 0.80  --   CALCIUM 8.1* 8.2*  --   MG  --  2.0  --   PHOS  --  2.9  --    Liver Function Tests  Recent Labs  08/27/13 2029 08/28/13 0530  AST 21 14  ALT 10 8  ALKPHOS 109 117  BILITOT 0.4 0.6  PROT 6.4 6.6  ALBUMIN 3.2* 3.4*   No results found for this basename: LIPASE, AMYLASE,  in the last 72 hours Cardiac Enzymes  Recent Labs  08/28/13 0530 08/28/13 1400  TROPONINI <0.30 <0.30   BNP No components found with this basename: POCBNP,  D-Dimer No results found for this basename: DDIMER,  in the last 72 hours Hemoglobin A1C  Recent Labs  08/28/13 0530  HGBA1C 8.9*   Fasting Lipid Panel No results found for this basename: CHOL, HDL, LDLCALC, TRIG, CHOLHDL, LDLDIRECT,  in the last 72 hours Thyroid Function Tests  Recent Labs  08/28/13 0530  TSH 34.809*    TELE  NSR  ECG  29-Aug-2013 06:33:29 [facility]-20 ROUTINE RECORD Normal sinus rhythm Prolonged QT Abnormal ECG  Radiology/Studies  Dg Chest Portable 1 View  08/27/2013   CLINICAL DATA:  Chest pain, shortness of breath and weakness.  EXAM: PORTABLE CHEST - 1 VIEW  COMPARISON:  07/06/2012.  FINDINGS: The cardiac silhouette remains mildly enlarged. The interstitial markings remain mildly prominent. A interval small amount of bilateral linear density. Left humeral head prosthesis and  cervical spine fixation hardware are again demonstrated.  IMPRESSION: 1. Interval small amount of bilateral linear atelectasis or scarring. 2. Stable mild cardiomegaly and mild chronic interstitial lung disease.   Electronically Signed   By: Gordan Payment M.D.   On: 08/27/2013 19:55    ASSESSMENT AND PLAN 1. exertional chest pain and dyspnea rule out ischemic heart disease. Multiple risk factors present. Myoview done, results pending. 2. mild mitral stenosis. She gives a history of possible rheumatic heart disease at age 63. She does not have left atrial enlargement on echo.  3. hypertensive cardiovascular disease  4. diabetes mellitus type 2  5. COPD exacerbation 6. Previously untreated hypothyroidism can be contributing to her fatigue and dyspnea.  From cardiac standpoint can be discharged if myoview is okay.  Signed, Cassell Clement NP

## 2013-08-30 LAB — GLUCOSE, CAPILLARY
Glucose-Capillary: 67 mg/dL — ABNORMAL LOW (ref 70–99)
Glucose-Capillary: 434 mg/dL — ABNORMAL HIGH (ref 70–99)
Glucose-Capillary: 421 mg/dL — ABNORMAL HIGH (ref 70–99)

## 2013-08-30 MED ORDER — LEVOFLOXACIN 500 MG PO TABS: 500.0000 mg | ORAL_TABLET | Freq: Every day | ORAL | Status: DC

## 2013-08-30 MED ORDER — METHYLPREDNISOLONE 4 MG PO KIT: PACK | ORAL | Status: DC

## 2013-08-30 MED ORDER — LEVOTHYROXINE SODIUM 25 MCG PO TABS: 25.0000 ug | ORAL_TABLET | Freq: Every day | ORAL | Status: DC

## 2013-08-30 MED ORDER — INSULIN GLARGINE 100 UNIT/ML ~~LOC~~ SOLN: 40.0000 [IU] | Freq: Every day | SUBCUTANEOUS | Status: DC

## 2013-08-30 MED ORDER — PREDNISONE 20 MG PO TABS
40.0000 mg | ORAL_TABLET | Freq: Every day | ORAL | Status: DC
  Filled 2013-08-30: qty 2

## 2013-08-30 MED ORDER — LEVOTHYROXINE SODIUM 25 MCG PO TABS
25.0000 ug | ORAL_TABLET | Freq: Every day | ORAL | Status: DC
  Filled 2013-08-30: qty 1

## 2013-08-30 MED ORDER — INSULIN ASPART 100 UNIT/ML ~~LOC~~ SOLN
10.0000 [IU] | Freq: Once | SUBCUTANEOUS | Status: AC
  Administered 2013-08-30: 10 [IU] via SUBCUTANEOUS

## 2013-08-30 MED ORDER — INSULIN GLARGINE 100 UNIT/ML ~~LOC~~ SOLN
20.0000 [IU] | Freq: Two times a day (BID) | SUBCUTANEOUS | Status: DC
  Administered 2013-08-30: 20 [IU] via SUBCUTANEOUS
  Filled 2013-08-30 (×2): qty 0.2

## 2013-08-30 NOTE — Progress Notes (Signed)
Nursing note 08/30/13 Patient blood sugar 421 after recheck at one hour... Dr Thedore Mins made awareOrders received will recheck in one hour. [name], Elizabeth An  rN

## 2013-08-30 NOTE — Discharge Summary (Addendum)
[facility]                                                                                   [name], is a 52 y.o. female  DOB 1960/12/06  MRN [id number].  Admission date:  08/27/2013  Admitting Physician  Therisa Doyne, MD  Discharge Date:  08/30/2013   Primary MD  [name], MD  Recommendations for primary care physician for things to follow:   Monitor glycemic control, repeat 2 view chest x-ray in a week.   Admission Diagnosis  Dyspnea [786.09] Chest pain [786.50]  Discharge Diagnosis  COPD exacerbation atypical noncardiac chest pain  Active Problems:   HTN (hypertension)   Chest pain on exertion   Diabetes type 2, controlled   Chest pain   COPD exacerbation      Past Medical History  Diagnosis Date  . Diabetes mellitus   . Hypertension   . Anemia   . Chronic back pain   . Neuropathy   . Hypercholesterolemia   . Major depressive disorder, recurrent   . Generalized anxiety disorder   . Panic disorder with agoraphobia   . Tobacco abuse     Past Surgical History  Procedure Laterality Date  . Foot surgery    . Back surgery    . Hernia repair    . Tonsillectomy and adenoidectomy    . Shoulder surgery    . Knee surgery    . Colon surgery    . Breast surgery       Discharge Condition: stable       Follow-up Information   Follow up with AVBUERE,EDWIN A, MD. Schedule an appointment as soon as possible for a visit in 2 days.   Specialty:  Internal Medicine   Contact information:   [address] [place] [address] [phone]       Follow up with Willa Rough, MD. Schedule an appointment as soon as possible for a visit in 2 weeks.   Specialty:  Cardiology   Contact information:   [address] [place] [address] [phone]         Consults obtained - Cards   Discharge Medications      Medication List         ALPRAZolam 0.5 MG tablet  Commonly known as:  XANAX  Take 1 tablet (0.5  mg total) by mouth 2 (two) times daily as needed. For anxiety     ARIPiprazole 5 MG tablet  Commonly known as:  ABILIFY  Take 5 mg by mouth daily.     buPROPion 150 MG 12 hr tablet  Commonly known as:  WELLBUTRIN SR  Take 150 mg by mouth 2 (two) times daily.     FLUoxetine 40 MG capsule  Commonly known as:  PROZAC  Take 1 capsule (40 mg total) by mouth daily.     HYDROCHLOROTHIAZIDE PO  Take 1 tablet by mouth daily.     insulin aspart 100 UNIT/ML injection  Commonly known as:  novoLOG  Inject 8 Units into the skin 3 (three) times daily as needed. If CBG > 100     insulin glargine 100 UNIT/ML  injection  Commonly known as:  LANTUS  Inject 0.4 mLs (40 Units total) into the skin at bedtime.     levofloxacin 500 MG tablet  Commonly known as:  LEVAQUIN  Take 1 tablet (500 mg total) by mouth daily.     levothyroxine 25 MCG tablet  Commonly known as:  LEVOTHROID  Take 1 tablet (25 mcg total) by mouth daily before breakfast.     losartan 100 MG tablet  Commonly known as:  COZAAR  Take 1 tablet (100mg ) by mouth daily     methylPREDNISolone 4 MG tablet  Commonly known as:  MEDROL DOSEPAK  follow package directions     omeprazole 20 MG capsule  Commonly known as:  PRILOSEC  Take 1 capsule 30-60 minutes before first meal of the day     PROAIR HFA 108 (90 BASE) MCG/ACT inhaler  Generic drug:  albuterol  Inhale 2 puffs into the lungs every 6 hours as needed for wheezing or shortness of breath     traZODone 100 MG tablet  Commonly known as:  DESYREL  Take 100 mg by mouth at bedtime.         Diet and Activity recommendation: See Discharge Instructions below   Discharge Instructions     Follow with Primary MD Fleet Contras A, MD in 2 days   Get CBC, CMP, TSH checked 2 days by Primary MD and again as instructed by your Primary MD. Get a 2 view Chest X ray done next visit.  Accuchecks 4 times/day, Once in AM empty stomach and then before each meal. Log in all results  and show them to your Prim.MD in 3 days. If any glucose reading is under 80 or above 300 call your Prim MD immidiately. Follow Low glucose instructions for glucose under 80 as instructed.  Get Medicines reviewed and adjusted.  Please request your Prim.MD to go over all Hospital Tests and Procedure/Radiological results at the follow up, please get all Hospital records sent to your Prim MD by signing hospital release before you go home.  Activity: As tolerated with Full fall precautions use walker/cane & assistance as needed   Diet:  Heart healthy low carbohydrate  For Heart failure patients - Check your Weight same time everyday, if you gain over 2 pounds, or you develop in leg swelling, experience more shortness of breath or chest pain, call your Primary MD immediately. Follow Cardiac Low Salt Diet and 1.8 lit/day fluid restriction.  Disposition Home   If you experience worsening of your admission symptoms, develop shortness of breath, life threatening emergency, suicidal or homicidal thoughts you must seek medical attention immediately by calling 911 or calling your MD immediately  if symptoms less severe.  You Must read complete instructions/literature along with all the possible adverse reactions/side effects for all the Medicines you take and that have been prescribed to you. Take any new Medicines after you have completely understood and accpet all the possible adverse reactions/side effects.   Do not drive and provide baby sitting services if your were admitted for syncope or siezures until you have seen by Primary MD or a Neurologist and advised to do so again.  Do not drive when taking Pain medications.    Do not take more than prescribed Pain, Sleep and Anxiety Medications  Special Instructions: If you have smoked or chewed Tobacco  in the last 2 yrs please stop smoking, stop any regular Alcohol  and or any Recreational drug use.  Wear Seat belts while driving.  Please  note  You were cared for by a hospitalist during your hospital stay. If you have any questions about your discharge medications or the care you received while you were in the hospital after you are discharged, you can call the unit and asked to speak with the hospitalist on call if the hospitalist that took care of you is not available. Once you are discharged, your primary care physician will handle any further medical issues. Please note that NO REFILLS for any discharge medications will be authorized once you are discharged, as it is imperative that you return to your primary care physician (or establish a relationship with a primary care physician if you do not have one) for your aftercare needs so that they can reassess your need for medications and monitor your lab values.    Major procedures and Radiology Reports - PLEASE review detailed and final reports for all details, in brief -    Echo  - Left ventricle: The cavity size was normal. Wall thickness was normal. Systolic function was normal. The estimated ejection fraction was in the range of 50% to 55%. Wall motion was normal; there were no regional wall motion abnormalities. - Mitral valve: The findings are consistent with mild stenosis.    Nm Myocar Multi W/spect W/wall Motion / Ef  08/30/2013   CLINICAL DATA:  Chest pain, hypertension and diabetes.  EXAM: MYOCARDIAL IMAGING WITH SPECT (REST AND PHARMACOLOGIC-STRESS)  GATED LEFT VENTRICULAR WALL MOTION STUDY  LEFT VENTRICULAR EJECTION FRACTION  TECHNIQUE: Standard myocardial SPECT imaging was performed after resting intravenous injection of 10 mCi Tc-42m sestamibi. Subsequently, intravenous infusion of Lexiscan was performed under the supervision of the Cardiology staff. At peak effect of the drug, 30 mCi Tc-11m sestamibi was injected intravenously and standard myocardial SPECT imaging was performed. Quantitative gated imaging was also performed to evaluate left ventricular wall motion,  and estimate left ventricular ejection fraction.  COMPARISON:  None.  FINDINGS: Utilizing gated data, the end-diastolic volume is estimated to be 52 mL and the end systolic volume 14 mL. Calculated ejection fraction is 73%.  Gated wall motion analysis is unremarkable and shows no regional wall motion abnormalities.  SPECT imaging shows mild apical attenuation on both stress and rest acquisitions which is likely a related to overlying breast soft tissue attenuation. There is no evidence of inducible myocardial ischemia or significant fixed perfusion defects.  IMPRESSION: Normal study demonstrating no evidence of inducible myocardial ischemia. Left ventricular function is normal with quantitative ejection fraction calculation of 73%.   Electronically Signed   By: Irish Lack M.D.   On: 08/30/2013 07:44   Dg Chest Portable 1 View  08/27/2013   CLINICAL DATA:  Chest pain, shortness of breath and weakness.  EXAM: PORTABLE CHEST - 1 VIEW  COMPARISON:  07/06/2012.  FINDINGS: The cardiac silhouette remains mildly enlarged. The interstitial markings remain mildly prominent. A interval small amount of bilateral linear density. Left humeral head prosthesis and cervical spine fixation hardware are again demonstrated.  IMPRESSION: 1. Interval small amount of bilateral linear atelectasis or scarring. 2. Stable mild cardiomegaly and mild chronic interstitial lung disease.   Electronically Signed   By: Gordan Payment M.D.   On: 08/27/2013 19:55    Micro Results      No results found for this or any previous visit (from the past 240 hour(s)).   History of present illness and  Hospital Course:     Kindly see H&P for history of present illness and admission  details, please review complete Labs, Consult reports and Test reports for all details in brief [name], is a 52 y.o. female, patient with history of  COPD, past history of smoking, diabetes mellitus type 2, hypertension, depression and anxiety. Was admitted  to the hospital with chief complaints of exertional shortness of breath, wheezing and atypical chest pain.   She ruled out for MI with serial negative troponin, EKG was nonacute, had a stable echo gram along with stress test, she was seen by cardiology. After full workup her diagnosis was consistent with COPD exacerbation. She responded well to IV steroids and empiric Levaquin, she has now transitioned well into oral steroids, although she will require 2 L nasal cannula home oxygen, she is completely symptom free and eager to go home. She will be placed on short course of oral steroid taper along with oral Levaquin.   Of note during her workup she was noted to have mild mitral valve stenosis on the echogram we'll request PCP to kindly issue patient follows with recommended cardiologist on a close basis.   Outpatient glycemic control could be better her A1c was close to 9, we'll request PCP to kindly monitor her glycemic control and adjust insulin dose as needed. She will resume all her other home medications for her chronic medical problems unchanged.    During her hospital workup for fatigue and exertional shortness of breath patient had a TSH initially it came to 37.5, she was started on Synthroid repeat TSH, free T3 and T4 levels were much better however they were suggestive of primary hypothyroidism with mildly elevated TSH and reduced free T3 and T4 levels. She will placed on Synthroid we'll request PCP to repeat TSH again in a few weeks       Today   Subjective:   [name] today has no headache,no chest abdominal pain,no new weakness tingling or numbness, feels much better wants to go home today.    Objective:   Blood pressure 106/60, pulse 70, temperature 97.6 F (36.4 C), temperature source Oral, resp. rate 18, height 5\' 2"  (1.575 m), weight 97.8 kg (215 lb 9.8 oz), SpO2 94.00%.   Intake/Output Summary (Last 24 hours) at 08/30/13 1110 Last data filed at 08/30/13 0700   Gross per 24 hour  Intake    780 ml  Output    400 ml  Net    380 ml    Exam Awake Alert, Oriented *3, No new F.N deficits, Normal affect [place].AT,PERRAL Supple Neck,No JVD, No cervical lymphadenopathy appriciated.  Symmetrical Chest wall movement, Good air movement bilaterally, CTAB RRR,No Gallops,Rubs or new Murmurs, No Parasternal Heave +ve B.Sounds, Abd Soft, Non tender, No organomegaly appriciated, No rebound -guarding or rigidity. No Cyanosis, Clubbing or edema, No new Rash or bruise  Data Review   CBC w Diff: Lab Results  Component Value Date   WBC 9.3 08/28/2013   HGB 11.6* 08/28/2013   HCT 35.4* 08/28/2013   PLT 250 08/28/2013   LYMPHOPCT 30 08/10/2011   MONOPCT 7 08/10/2011   EOSPCT 3 08/10/2011   BASOPCT 1 08/10/2011    CMP: Lab Results  Component Value Date   NA 133* 08/28/2013   K 4.6 08/29/2013   CL 92* 08/28/2013   CO2 29 08/28/2013   BUN 12 08/28/2013   CREATININE 0.80 08/28/2013   PROT 6.6 08/28/2013   ALBUMIN 3.4* 08/28/2013   BILITOT 0.6 08/28/2013   ALKPHOS 117 08/28/2013   AST 14 08/28/2013   ALT 8 08/28/2013  .  Lab Results  Component Value Date   TSH 4.781* 08/29/2013   Lab Results  Component Value Date   HGBA1C 8.9* 08/28/2013     Total Time in preparing paper work, data evaluation and todays exam - 35 minutes  Leroy Sea M.D on 08/30/2013 at 11:10 AM  [facility] Office  [phone]

## 2013-08-30 NOTE — Progress Notes (Signed)
Dr Leta Speller made aware of pt blood sugar 342, he stated OK to discharge home. Will discharge home as ordered. Choice Kleinsasser, Randall An  rN

## 2013-08-30 NOTE — Progress Notes (Signed)
Doing well this am. Anxious to go home.  States her chest pain and dyspnea have resolved. Myoview shows normal EF and no ischemia.  Okay for discharge from cardiac standpoint.

## 2013-08-30 NOTE — Care Management Note (Signed)
    Page 1 of 1   08/30/2013     5:09:32 PM   CARE MANAGEMENT NOTE 08/30/2013  Patient:  [name], [name]   Account Number:  [id number]  Date Initiated:  08/30/2013  Documentation initiated by:  Ranata Laughery  Subjective/Objective Assessment:   PT ADM ON 08/27/13 WITH CP, COPD EXACERBATION.     Action/Plan:   CM REFERRAL FOR HOME OXYGEN; SATS 86 ON ROOM AIR.  REFERRAL TO [facility] FOR HOME O2 SET UP.   Anticipated DC Date:  08/30/2013   Anticipated DC Plan:  HOME/SELF CARE      DC Planning Services  CM consult      Choice offered to / List presented to:     DME arranged  [facility]      DME agency  [facility]        Status of service:  Completed, signed off Medicare Important Message given?   (If response is "NO", the following Medicare IM given date fields will be blank) Date Medicare IM given:   Date Additional Medicare IM given:    Discharge Disposition:  HOME/SELF CARE  Per UR Regulation:  Reviewed for med. necessity/level of care/duration of stay  If discussed at Long Length of Stay Meetings, dates discussed:    Comments:

## 2013-08-30 NOTE — Progress Notes (Signed)
Patient given discharge instructions and medication list, no paper prescriptions given as they were called into patient pharmacy. All questions were answered will discharge home as ordered, home 02 has been set up with home health. Zalman Hull, Randall An rN

## 2013-08-30 NOTE — Progress Notes (Signed)
CBG 434 at lunchtime, Dr. Thedore Mins made aware, ordered to give 18 units novolog, recheck in 1 hr, and DC home if stable. [address] Filter

## 2013-08-30 NOTE — Progress Notes (Signed)
SATURATION QUALIFICATIONS: (This note is used to comply with regulatory documentation for home oxygen)  Patient Saturations on Room Air at Rest = 86%  Patient Saturations on2  Liters of oxygen while 94%  Please briefly explain why patient needs home oxygen: oxygen levels drop on room air to 86%.

## 2013-08-30 NOTE — Progress Notes (Signed)
Hypoglycemic Event  CBG: 29  Treatment: 15 GM carbohydrate snack plus 2 juices  Symptoms: Shaky and headache  Follow-up CBG: Time:0535 CBG Result:67  Possible Reasons for Event: Unknown  Comments/MD notified:pt rechecked at 0610 after eating a half a sandwich after initial juice and ensure recheck was 121    Viviana Simpler  Remember to initiate Hypoglycemia Order Set & complete

## 2013-08-30 NOTE — Progress Notes (Signed)
Nursing Note 08/30/13 Dr Thedore Mins made aware of patients resting o2 sat on room air. Will continue to monitor patient. Rozina Pointer, [employer]

## 2013-09-07 ENCOUNTER — Ambulatory Visit
Admission: RE | Admit: 2013-09-07 | Discharge: 2013-09-07 | Disposition: A | Discharge status: Home / Self Care | Payer: Medicaid Other | Source: Ambulatory Visit | Attending: Internal Medicine | Admitting: Internal Medicine

## 2013-09-07 ENCOUNTER — Other Ambulatory Visit: Payer: Self-pay | Admitting: Internal Medicine

## 2013-09-07 DIAGNOSIS — J449 Chronic obstructive pulmonary disease, unspecified: Secondary | ICD-10-CM

## 2013-09-11 ENCOUNTER — Emergency Department (HOSPITAL_COMMUNITY)
Admission: EM | Admit: 2013-09-11 | Discharge: 2013-09-11 | Disposition: A | Discharge status: Home / Self Care | Payer: Medicaid Other | Attending: Emergency Medicine | Admitting: Emergency Medicine

## 2013-09-11 ENCOUNTER — Encounter (HOSPITAL_COMMUNITY): Payer: Self-pay | Admitting: Emergency Medicine

## 2013-09-11 ENCOUNTER — Emergency Department (HOSPITAL_COMMUNITY): Payer: Medicaid Other

## 2013-09-11 DIAGNOSIS — S4980XA Other specified injuries of shoulder and upper arm, unspecified arm, initial encounter: Secondary | ICD-10-CM | POA: Insufficient documentation

## 2013-09-11 DIAGNOSIS — I10 Essential (primary) hypertension: Secondary | ICD-10-CM | POA: Insufficient documentation

## 2013-09-11 DIAGNOSIS — F339 Major depressive disorder, recurrent, unspecified: Secondary | ICD-10-CM | POA: Insufficient documentation

## 2013-09-11 DIAGNOSIS — Z79899 Other long term (current) drug therapy: Secondary | ICD-10-CM | POA: Insufficient documentation

## 2013-09-11 DIAGNOSIS — Y929 Unspecified place or not applicable: Secondary | ICD-10-CM | POA: Insufficient documentation

## 2013-09-11 DIAGNOSIS — F4001 Agoraphobia with panic disorder: Secondary | ICD-10-CM | POA: Insufficient documentation

## 2013-09-11 DIAGNOSIS — Y9389 Activity, other specified: Secondary | ICD-10-CM | POA: Insufficient documentation

## 2013-09-11 DIAGNOSIS — M791 Myalgia, unspecified site: Secondary | ICD-10-CM

## 2013-09-11 DIAGNOSIS — W010XXA Fall on same level from slipping, tripping and stumbling without subsequent striking against object, initial encounter: Secondary | ICD-10-CM | POA: Insufficient documentation

## 2013-09-11 DIAGNOSIS — T148XXA Other injury of unspecified body region, initial encounter: Secondary | ICD-10-CM | POA: Insufficient documentation

## 2013-09-11 DIAGNOSIS — E119 Type 2 diabetes mellitus without complications: Secondary | ICD-10-CM | POA: Insufficient documentation

## 2013-09-11 DIAGNOSIS — IMO0001 Reserved for inherently not codable concepts without codable children: Secondary | ICD-10-CM | POA: Insufficient documentation

## 2013-09-11 DIAGNOSIS — Z862 Personal history of diseases of the blood and blood-forming organs and certain disorders involving the immune mechanism: Secondary | ICD-10-CM | POA: Insufficient documentation

## 2013-09-11 DIAGNOSIS — G8929 Other chronic pain: Secondary | ICD-10-CM | POA: Insufficient documentation

## 2013-09-11 DIAGNOSIS — E7800 Pure hypercholesterolemia, unspecified: Secondary | ICD-10-CM | POA: Insufficient documentation

## 2013-09-11 DIAGNOSIS — Z87891 Personal history of nicotine dependence: Secondary | ICD-10-CM | POA: Insufficient documentation

## 2013-09-11 DIAGNOSIS — Z794 Long term (current) use of insulin: Secondary | ICD-10-CM | POA: Insufficient documentation

## 2013-09-11 DIAGNOSIS — S46909A Unspecified injury of unspecified muscle, fascia and tendon at shoulder and upper arm level, unspecified arm, initial encounter: Secondary | ICD-10-CM | POA: Insufficient documentation

## 2013-09-11 LAB — URINALYSIS, ROUTINE W REFLEX MICROSCOPIC
Bilirubin Urine: NEGATIVE
Hgb urine dipstick: NEGATIVE
Ketones, ur: NEGATIVE mg/dL
Leukocytes, UA: NEGATIVE
Nitrite: NEGATIVE
Protein, ur: NEGATIVE mg/dL
Specific Gravity, Urine: 1.021 (ref 1.005–1.030)
Urobilinogen, UA: 1 mg/dL (ref 0.0–1.0)

## 2013-09-11 LAB — CBC WITH DIFFERENTIAL/PLATELET
Basophils Absolute: 0 10*3/uL (ref 0.0–0.1)
Basophils Relative: 0 % (ref 0–1)
Eosinophils Absolute: 0.1 10*3/uL (ref 0.0–0.7)
HCT: 34.7 % — ABNORMAL LOW (ref 36.0–46.0)
MCH: 29.7 pg (ref 26.0–34.0)
MCHC: 34.3 g/dL (ref 30.0–36.0)
MCV: 86.5 fL (ref 78.0–100.0)
Monocytes Absolute: 0.8 10*3/uL (ref 0.1–1.0)
Neutro Abs: 12.9 10*3/uL — ABNORMAL HIGH (ref 1.7–7.7)
Neutrophils Relative %: 86 % — ABNORMAL HIGH (ref 43–77)
RBC: 4.01 MIL/uL (ref 3.87–5.11)
RDW: 13.5 % (ref 11.5–15.5)

## 2013-09-11 LAB — BASIC METABOLIC PANEL
BUN: 17 mg/dL (ref 6–23)
Calcium: 9 mg/dL (ref 8.4–10.5)
Chloride: 90 mEq/L — ABNORMAL LOW (ref 96–112)
Creatinine, Ser: 0.84 mg/dL (ref 0.50–1.10)
GFR calc Af Amer: 90 mL/min — ABNORMAL LOW (ref >90)
GFR calc non Af Amer: 77 mL/min — ABNORMAL LOW (ref >90)
Potassium: 3.6 mEq/L (ref 3.5–5.1)

## 2013-09-11 LAB — GLUCOSE, CAPILLARY: Glucose-Capillary: 213 mg/dL — ABNORMAL HIGH (ref 70–99)

## 2013-09-11 MED ORDER — ACETAMINOPHEN 500 MG PO TABS: 500.0000 mg | ORAL_TABLET | Freq: Four times a day (QID) | ORAL | Status: DC | PRN

## 2013-09-11 MED ORDER — IOHEXOL 300 MG/ML  SOLN
100.0000 mL | Freq: Once | INTRAMUSCULAR | Status: AC | PRN
  Administered 2013-09-11: 100 mL via INTRAVENOUS

## 2013-09-11 MED ORDER — HYDROCODONE-ACETAMINOPHEN 5-325 MG PO TABS
2.0000 | ORAL_TABLET | Freq: Once | ORAL | Status: AC
  Administered 2013-09-11: 2 via ORAL
  Filled 2013-09-11: qty 2

## 2013-09-11 MED ORDER — INSULIN GLARGINE 100 UNIT/ML ~~LOC~~ SOLN
40.0000 [IU] | Freq: Every day | SUBCUTANEOUS | Status: DC
Start: 2013-09-11 — End: 2013-09-12
  Administered 2013-09-11: 40 [IU] via SUBCUTANEOUS
  Filled 2013-09-11: qty 0.4

## 2013-09-11 NOTE — ED Notes (Signed)
Pt is unable to ride bus home, home too far from bus line.  Called [facility] for transport home.

## 2013-09-11 NOTE — ED Notes (Signed)
Patient transported to X-ray 

## 2013-09-11 NOTE — ED Notes (Signed)
Patient transported to CT 

## 2013-09-11 NOTE — ED Provider Notes (Signed)
2000 - Patient care assumed from Elizabeth Lower, FNP at shift change. Patient presenting today for pain in her R flank/side as well as R shoulder. Xray significant for nondisplaced fractures of anterior R 5th-9th ribs of uncertain chronicity. She has CT chest, abdomen, and pelvis pending at this time for further evaluation of observed rib fractures.  2115 - CT of chest, abdomen, and pelvis without any acute posttraumatic findings. Radiologist reading makes no mention of rib fractures seen on chest x-ray. No pneumothorax. Will call radiologist to verify that with no acute rib fractures today.  2130 - Have consulted with Dr. Deanne Coffer regarding CT imaging. He states that he cannot confirm any acute or old rib fractures on CT scan today. Dr. Deanne Coffer believes evidence to suggest rib fracture on X-ray may actual be secondary to costochondral irregularities which he appreciates on CT imaging today.  2143 - I have reviewed all the imaging findings with the patient who verbalizes understanding. She has remained hemodynamically stable while in the emergency department today. I believe her to be stable for discharge home with instructions for Tylenol for pain control. Patient requesting "stronger pain medicine". I discussed with the patient that given her history of falls, I do not fill comfortable prescribing her medication that may make her altered and increase her risk of falling. Tylenol, again, advised as well as ice to the affected area and rest. Return precautions discussed with the patient who verbalizes understanding. Will attempt to coordinate cab home as patient states that she has no one to pick her up from the emergency department in no way to pay for transport.   Filed Vitals:   09/11/13 1930 09/11/13 2025 09/11/13 2030 09/11/13 2100  BP: 95/50 114/62 109/81 109/66  Pulse: 71 70 79 65  Temp:      TempSrc:      Resp: 13     SpO2: 100% 99% 95% 98%    Results for orders placed during the hospital  encounter of 09/11/13  URINALYSIS, ROUTINE W REFLEX MICROSCOPIC      Result Value Range   Color, Urine YELLOW  YELLOW   APPearance CLEAR  CLEAR   Specific Gravity, Urine 1.021  1.005 - 1.030   pH 7.0  5.0 - 8.0   Glucose, UA NEGATIVE  NEGATIVE mg/dL   Hgb urine dipstick NEGATIVE  NEGATIVE   Bilirubin Urine NEGATIVE  NEGATIVE   Ketones, ur NEGATIVE  NEGATIVE mg/dL   Protein, ur NEGATIVE  NEGATIVE mg/dL   Urobilinogen, UA 1.0  0.0 - 1.0 mg/dL   Nitrite NEGATIVE  NEGATIVE   Leukocytes, UA NEGATIVE  NEGATIVE  CBC WITH DIFFERENTIAL      Result Value Range   WBC 15.1 (*) 4.0 - 10.5 K/uL   RBC 4.01  3.87 - 5.11 MIL/uL   Hemoglobin 11.9 (*) 12.0 - 15.0 g/dL   HCT 16.1 (*) 09.6 - 04.5 %   MCV 86.5  78.0 - 100.0 fL   MCH 29.7  26.0 - 34.0 pg   MCHC 34.3  30.0 - 36.0 g/dL   RDW 40.9  81.1 - 91.4 %   Platelets 281  150 - 400 K/uL   Neutrophils Relative % 86 (*) 43 - 77 %   Neutro Abs 12.9 (*) 1.7 - 7.7 K/uL   Lymphocytes Relative 8 (*) 12 - 46 %   Lymphs Abs 1.2  0.7 - 4.0 K/uL   Monocytes Relative 5  3 - 12 %   Monocytes Absolute 0.8  0.1 -  1.0 K/uL   Eosinophils Relative 1  0 - 5 %   Eosinophils Absolute 0.1  0.0 - 0.7 K/uL   Basophils Relative 0  0 - 1 %   Basophils Absolute 0.0  0.0 - 0.1 K/uL  BASIC METABOLIC PANEL      Result Value Range   Sodium 129 (*) 135 - 145 mEq/L   Potassium 3.6  3.5 - 5.1 mEq/L   Chloride 90 (*) 96 - 112 mEq/L   CO2 30  19 - 32 mEq/L   Glucose, Bld 203 (*) 70 - 99 mg/dL   BUN 17  6 - 23 mg/dL   Creatinine, Ser 1.61  0.50 - 1.10 mg/dL   Calcium 9.0  8.4 - 09.6 mg/dL   GFR calc non Af Amer 77 (*) >90 mL/min   GFR calc Af Amer 90 (*) >90 mL/min  GLUCOSE, CAPILLARY      Result Value Range   Glucose-Capillary 213 (*) 70 - 99 mg/dL   Dg Ribs Unilateral W/chest Right  09/11/2013   CLINICAL DATA:  53 year old female with right chest and rib pain following fall  EXAM: RIGHT RIBS AND CHEST - 3+ VIEW  COMPARISON:  09/07/2013 and prior chest radiographs  dating back to 07/15/2007  FINDINGS: Cardiomegaly is identified.  Scattered areas of atelectasis/ scarring again noted as well as mild peribronchial thickening.  There are equivocal fractures of the anterior right 5th through 9th ribs and age indeterminate.  There is no evidence of focal airspace disease, pulmonary edema, suspicious pulmonary nodule/mass, pleural effusion, or pneumothorax  IMPRESSION: Equivocal nondisplaced fractures of the anterior right 5th through 9th ribs, of uncertain chronicity.  Cardiomegaly without evidence of acute cardiopulmonary disease.   Electronically Signed   By: Laveda Abbe M.D.   On: 09/11/2013 18:07   Ct Chest W Contrast  09/11/2013   CLINICAL DATA:  Right flank pain.  Tripped.  Fell.  EXAM: CT CHEST, ABDOMEN, AND PELVIS WITH CONTRAST  TECHNIQUE: Multidetector CT imaging of the chest, abdomen and pelvis was performed following the standard protocol during bolus administration of intravenous contrast.  CONTRAST:  OMNIPAQUE IOHEXOL 300 MG/ML  SOLN  COMPARISON:  07/12/2008  FINDINGS: CT CHEST FINDINGS  No pneumothorax. Coarse interstitial and ground-glass opacities posteriorly in both [name] lobes. No pleural or pericardial effusion. No mediastinal hematoma. No hilar or mediastinal adenopathy. Subcentimeter AP window and precarinal lymph nodes. Left shoulder arthroplasty hardware partially seen. Fixation hardware in the [name] cervical spine. Minimal spondylitic changes in the thoracic spine. Old fracture deformity of the sternum.  CT ABDOMEN AND PELVIS FINDINGS  Unremarkable liver, nondilated gallbladder, spleen, adrenal glands, kidneys. Diffuse pancreatic parenchymal atrophy without focal lesion. Heavy calcified plaque in the aorta without aneurysm. Portal vein patent. Stomach, small bowel, and colon are nondilated. Urinary bladder physiologically distended. Uterus and adnexal regions unremarkable. No ascites. No free air. No adenopathy. Normal bilateral renal excretion on  delayed scans. Old L1 compression fracture deformity with approximately 50% loss of height anteriorly. Bilateral L4 pars defects allowing at least 12 mm anterolisthesis L4-5 resulting in at least moderately severe spinal stenosis.  IMPRESSION: 1. No acute posttraumatic findings. 2. Coarse bilateral [name] lobe interstitial thickening and ground-glass opacities, new since 07/12/2008 but of uncertain chronicity. 3. Old L1 compression fracture deformity and sternal fracture deformity. 4. Bilateral L4 pars defects with grade 2 anterolisthesis L4-5 and spinal stenosis as before.   Electronically Signed   By: Oley Balm M.D.   On: 09/11/2013 21:07  Ct Abdomen Pelvis W Contrast  09/11/2013   CLINICAL DATA:  Right flank pain.  Tripped.  Fell.  EXAM: CT CHEST, ABDOMEN, AND PELVIS WITH CONTRAST  TECHNIQUE: Multidetector CT imaging of the chest, abdomen and pelvis was performed following the standard protocol during bolus administration of intravenous contrast.  CONTRAST:  OMNIPAQUE IOHEXOL 300 MG/ML  SOLN  COMPARISON:  07/12/2008  FINDINGS: CT CHEST FINDINGS  No pneumothorax. Coarse interstitial and ground-glass opacities posteriorly in both [name] lobes. No pleural or pericardial effusion. No mediastinal hematoma. No hilar or mediastinal adenopathy. Subcentimeter AP window and precarinal lymph nodes. Left shoulder arthroplasty hardware partially seen. Fixation hardware in the [name] cervical spine. Minimal spondylitic changes in the thoracic spine. Old fracture deformity of the sternum.  CT ABDOMEN AND PELVIS FINDINGS  Unremarkable liver, nondilated gallbladder, spleen, adrenal glands, kidneys. Diffuse pancreatic parenchymal atrophy without focal lesion. Heavy calcified plaque in the aorta without aneurysm. Portal vein patent. Stomach, small bowel, and colon are nondilated. Urinary bladder physiologically distended. Uterus and adnexal regions unremarkable. No ascites. No free air. No adenopathy. Normal bilateral  renal excretion on delayed scans. Old L1 compression fracture deformity with approximately 50% loss of height anteriorly. Bilateral L4 pars defects allowing at least 12 mm anterolisthesis L4-5 resulting in at least moderately severe spinal stenosis.  IMPRESSION: 1. No acute posttraumatic findings. 2. Coarse bilateral [name] lobe interstitial thickening and ground-glass opacities, new since 07/12/2008 but of uncertain chronicity. 3. Old L1 compression fracture deformity and sternal fracture deformity. 4. Bilateral L4 pars defects with grade 2 anterolisthesis L4-5 and spinal stenosis as before.   Electronically Signed   By: Oley Balm M.D.   On: 09/11/2013 21:07    Antony Madura, PA-C 09/11/13 2145

## 2013-09-11 NOTE — ED Provider Notes (Signed)
Medical screening examination/treatment/procedure(s) were performed by non-physician practitioner and as supervising physician I was immediately available for consultation/collaboration.  EKG Interpretation    Date/Time:  Saturday September 11 2013 15:23:52 EST Ventricular Rate:  70 PR Interval:  154 QRS Duration: 85 QT Interval:  422 QTC Calculation: 455 R Axis:   22 Text Interpretation:  Sinus rhythm No significant change since last tracing Confirmed by Bernette Mayers  MD, CHARLES ([id number]) on 09/11/2013 3:30:02 PM             Doug Sou, MD 09/11/13 2341

## 2013-09-11 NOTE — ED Notes (Signed)
To room via EMS.  Onset 2pm right flank, right abd and right shoulder pain when she woke up.  Around 2-4am this morning pt tripped over feet going to bathroom, denies LOC or dizziness.  Pt states she is "unsteady on feet".

## 2013-09-11 NOTE — ED Provider Notes (Signed)
CSN: [id number]     Arrival date & time 09/11/13  1511 History   First MD Initiated Contact with Patient 09/11/13 1516     Chief Complaint  Patient presents with  . Flank Pain   (Consider location/radiation/quality/duration/timing/severity/associated sxs/prior Treatment) HPI Comments: Pt states that she started with right flank pain and shoulder pain today:pt states that she fell earlier today because she is unsteady on her feet:pt states that she is not sob, no cough:pt states that he feels like she needs something for pain  The history is provided by the patient. No language interpreter was used.    Past Medical History  Diagnosis Date  . Diabetes mellitus   . Hypertension   . Anemia   . Chronic back pain   . Neuropathy   . Hypercholesterolemia   . Major depressive disorder, recurrent   . Generalized anxiety disorder   . Panic disorder with agoraphobia   . Tobacco abuse    Past Surgical History  Procedure Laterality Date  . Foot surgery    . Back surgery    . Hernia repair    . Tonsillectomy and adenoidectomy    . Shoulder surgery    . Knee surgery    . Colon surgery    . Breast surgery     Family History  Problem Relation Age of Onset  . Heart disease Mother   . Lung cancer Father     was a former smoker   History  Substance Use Topics  . Smoking status: Former Smoker -- 1.00 packs/day for 20 years    Types: Cigarettes    Quit date: 07/05/2012  . Smokeless tobacco: Never Used  . Alcohol Use: No   OB History   Grav Para Term Preterm Abortions TAB SAB Ect Mult Living                 Review of Systems  Constitutional: Negative.   Respiratory: Negative.     Allergies  Celecoxib and Ibuprofen  Home Medications   Current Outpatient Rx  Name  Route  Sig  Dispense  Refill  . albuterol (PROAIR HFA) 108 (90 BASE) MCG/ACT inhaler   Inhalation   Inhale 1 puff into the lungs 2 (two) times daily as needed for wheezing or shortness of breath.         .  ALPRAZolam (XANAX) 0.5 MG tablet   Oral   Take 0.5 mg by mouth daily as needed for anxiety.         . ARIPiprazole (ABILIFY) 5 MG tablet   Oral   Take 5 mg by mouth daily.         Marland Kitchen buPROPion (WELLBUTRIN SR) 200 MG 12 hr tablet   Oral   Take 200 mg by mouth 2 (two) times daily.         . cetirizine (ZYRTEC) 10 MG tablet   Oral   Take 10 mg by mouth daily as needed for allergies.         Marland Kitchen FLUoxetine (PROZAC) 40 MG capsule   Oral   Take 40 mg by mouth daily.         Marland Kitchen glimepiride (AMARYL) 4 MG tablet   Oral   Take 4 mg by mouth daily.         . hydrochlorothiazide (HYDRODIURIL) 25 MG tablet   Oral   Take 25 mg by mouth daily.         Marland Kitchen HYDROcodone-acetaminophen (NORCO/VICODIN) 5-325 MG per tablet  Oral   Take 1 tablet by mouth every 8 (eight) hours as needed (pain).         . insulin aspart (NOVOLOG) 100 UNIT/ML injection   Subcutaneous   Inject 8 Units into the skin 3 (three) times daily as needed for high blood sugar (CBG >100).          . insulin glargine (LANTUS) 100 UNIT/ML injection   Subcutaneous   Inject 0.4 mLs (40 Units total) into the skin at bedtime.   10 mL   12     Take 20 units of Lantus tonight and from tomorrow  ...   . levothyroxine (LEVOTHROID) 25 MCG tablet   Oral   Take 1 tablet (25 mcg total) by mouth daily before breakfast.   30 tablet   0   . losartan (COZAAR) 100 MG tablet   Oral   Take 100 mg by mouth daily.         . meloxicam (MOBIC) 7.5 MG tablet   Oral   Take 7.5 mg by mouth 2 (two) times daily.         . methocarbamol (ROBAXIN) 500 MG tablet   Oral   Take 500 mg by mouth 2 (two) times daily as needed for muscle spasms.         Marland Kitchen omeprazole (PRILOSEC) 20 MG capsule   Oral   Take 20 mg by mouth daily.         Marland Kitchen SALINE NA   Nasal   Place 1 drop into the nose daily as needed (congestion).         . simvastatin (ZOCOR) 10 MG tablet   Oral   Take 10 mg by mouth daily.         . traZODone  (DESYREL) 100 MG tablet   Oral   Take 100 mg by mouth at bedtime.          Marland Kitchen levofloxacin (LEVAQUIN) 500 MG tablet   Oral   Take 500 mg by mouth daily. 4 day course completed on or about 09/04/13         . methylPREDNISolone (MEDROL) 4 MG tablet   Oral   Take 4-40 mg by mouth daily. 6 day course completed on or about 09/06/13 - per package directions          BP 109/73  Pulse 68  Temp(Src) 98.3 F (36.8 C) (Oral)  Resp 20  SpO2 98% Physical Exam  Nursing note and vitals reviewed. Constitutional: [name] oriented to person, place, and time. She appears well-developed and well-nourished.  Cardiovascular: Normal rate and regular rhythm.   Pulmonary/Chest: Effort normal and breath sounds normal.  Right lower lateral rib tender with palpation  Abdominal: Soft. Bowel sounds are normal. There is no tenderness.  Musculoskeletal: Normal range of motion.       Cervical back: Normal.       Thoracic back: Normal.       Lumbar back: Normal.  Neurological: [name] alert and oriented to person, place, and time. Coordination normal.  Skin: Skin is warm and dry.  Psychiatric: She has a normal mood and affect.    ED Course  Procedures (including critical care time) Labs Review Labs Reviewed  CBC WITH DIFFERENTIAL - Abnormal; Notable for the following:    WBC 15.1 (*)    Hemoglobin 11.9 (*)    HCT 34.7 (*)    Neutrophils Relative % 86 (*)    Neutro Abs 12.9 (*)  Lymphocytes Relative 8 (*)    All other components within normal limits  BASIC METABOLIC PANEL - Abnormal; Notable for the following:    Sodium 129 (*)    Chloride 90 (*)    Glucose, Bld 203 (*)    GFR calc non Af Amer 77 (*)    GFR calc Af Amer 90 (*)    All other components within normal limits  GLUCOSE, CAPILLARY - Abnormal; Notable for the following:    Glucose-Capillary 213 (*)    All other components within normal limits  URINALYSIS, ROUTINE W REFLEX MICROSCOPIC   Imaging Review Dg Ribs Unilateral W/chest  Right  09/11/2013   CLINICAL DATA:  52 year old female with right chest and rib pain following fall  EXAM: RIGHT RIBS AND CHEST - 3+ VIEW  COMPARISON:  09/07/2013 and prior chest radiographs dating back to 07/15/2007  FINDINGS: Cardiomegaly is identified.  Scattered areas of atelectasis/ scarring again noted as well as mild peribronchial thickening.  There are equivocal fractures of the anterior right 5th through 9th ribs and age indeterminate.  There is no evidence of focal airspace disease, pulmonary edema, suspicious pulmonary nodule/mass, pleural effusion, or pneumothorax  IMPRESSION: Equivocal nondisplaced fractures of the anterior right 5th through 9th ribs, of uncertain chronicity.  Cardiomegaly without evidence of acute cardiopulmonary disease.   Electronically Signed   By: Laveda Abbe M.D.   On: 09/11/2013 18:07    EKG Interpretation    Date/Time:  Saturday September 11 2013 15:23:52 EST Ventricular Rate:  70 PR Interval:  154 QRS Duration: 85 QT Interval:  422 QTC Calculation: 455 R Axis:   22 Text Interpretation:  Sinus rhythm No significant change since last tracing Confirmed by SHELDON  MD, CHARLES ([id number]) on 09/11/2013 3:30:02 PM            MDM   1. Contusion   2. Muscle soreness     7:48 PM Pt is having ct of abdomen and pelvis and chest to clarify wounds from the fall    Teressa Lower, NP 09/12/13 1117

## 2013-09-11 NOTE — ED Notes (Signed)
Pt unable to go by Cliffton Asters PA called [facility] [place] for cab voucher, was advised to call house supervisor.  Junie Spencer authorized cab voucher.  Pt discharged.

## 2013-09-11 NOTE — ED Notes (Signed)
Lab called, urine that was sent down leaked in bag, please re-send another urine sample.

## 2013-09-14 NOTE — ED Provider Notes (Signed)
Medical screening examination/treatment/procedure(s) were performed by non-physician practitioner and as supervising physician I was immediately available for consultation/collaboration.  EKG Interpretation    Date/Time:  Saturday September 11 2013 15:23:52 EST Ventricular Rate:  70 PR Interval:  154 QRS Duration: 85 QT Interval:  422 QTC Calculation: 455 R Axis:   22 Text Interpretation:  Sinus rhythm No significant change since last tracing Confirmed by SHELDON  MD, CHARLES [phone]) on 09/11/2013 3:30:02 PM              Leonette Most B. Bernette Mayers, MD 09/14/13 2024

## 2013-10-06 ENCOUNTER — Encounter: Payer: Self-pay | Admitting: Cardiology

## 2013-10-06 ENCOUNTER — Ambulatory Visit (INDEPENDENT_AMBULATORY_CARE_PROVIDER_SITE_OTHER): Payer: Medicaid Other | Admitting: Cardiology

## 2013-10-06 VITALS — BP 138/60 | HR 77 | Ht 62.0 in | Wt 215.0 lb

## 2013-10-06 DIAGNOSIS — R079 Chest pain, unspecified: Secondary | ICD-10-CM

## 2013-10-06 DIAGNOSIS — I10 Essential (primary) hypertension: Secondary | ICD-10-CM

## 2013-10-06 DIAGNOSIS — F17200 Nicotine dependence, unspecified, uncomplicated: Secondary | ICD-10-CM

## 2013-10-06 DIAGNOSIS — Z720 Tobacco use: Secondary | ICD-10-CM

## 2013-10-06 DIAGNOSIS — I050 Rheumatic mitral stenosis: Secondary | ICD-10-CM | POA: Insufficient documentation

## 2013-10-06 NOTE — Patient Instructions (Signed)
Your physician recommends that you continue on your current medications as directed. Please refer to the Current Medication list given to you today.  Your physician wants you to follow-up in: 6 month ov/ekg You will receive a reminder letter in the mail two months in advance. If you don't receive a letter, please call our office to schedule the follow-up appointment.  

## 2013-10-06 NOTE — Assessment & Plan Note (Signed)
Patient quit smoking a year ago.

## 2013-10-06 NOTE — Assessment & Plan Note (Signed)
The patient has not been having any symptoms from her mild mitral stenosis.  She remains in normal sinus rhythm.  No history of thromboembolic disease.

## 2013-10-06 NOTE — Assessment & Plan Note (Signed)
Blood pressure was remaining stable on current therapy.  No dizziness or syncope. 

## 2013-10-06 NOTE — Progress Notes (Signed)
[name] Date of Birth:  Jun 24, 1961 [address] [place], [place]  [id number] [phone]         Fax   [phone]  History of Present Illness: This pleasant 53 year old woman was recently seen at [facility] when she was admitted with dyspnea and atypical chest pain.  She ruled out for myocardial infarction.  She had an echocardiogram on 08/28/13 which showed an ejection fraction of 50-55% with normal systolic function and with mild mitral stenosis.  She had a Lexus scan Myoview stress test on 08/29/13 which showed no evidence of ischemia and her ejection fraction was normal at 73%.  The patient gives a history of having had rheumatic fever when she was in the first grade.  This would correlate with her mild mitral stenosis seen on echo.  She does not have any history of atrial fibrillation or thromboembolic disease.  [name] a former smoker and has a history of COPD.  She also has a history of acid reflux.  Current Outpatient Prescriptions  Medication Sig Dispense Refill  . acetaminophen (TYLENOL) 500 MG tablet Take 1 tablet (500 mg total) by mouth every 6 (six) hours as needed.  30 tablet  0  . albuterol (PROAIR HFA) 108 (90 BASE) MCG/ACT inhaler Inhale 1 puff into the lungs 2 (two) times daily as needed for wheezing or shortness of breath.      . ALPRAZolam (XANAX) 0.5 MG tablet Take 0.5 mg by mouth daily as needed for anxiety.      . ARIPiprazole (ABILIFY) 5 MG tablet Take 5 mg by mouth daily.      Marland Kitchen. buPROPion (WELLBUTRIN SR) 200 MG 12 hr tablet Take 200 mg by mouth 2 (two) times daily.      . cetirizine (ZYRTEC) 10 MG tablet Take 10 mg by mouth daily as needed for allergies.      Marland Kitchen. FLUoxetine (PROZAC) 40 MG capsule Take 40 mg by mouth daily.      Marland Kitchen. glimepiride (AMARYL) 4 MG tablet Take 4 mg by mouth daily.      . hydrochlorothiazide (HYDRODIURIL) 25 MG tablet Take 25 mg by mouth daily.      . insulin aspart (NOVOLOG) 100 UNIT/ML injection Inject 8 Units  into the skin 3 (three) times daily as needed for high blood sugar (CBG >100).       . insulin glargine (LANTUS) 100 UNIT/ML injection Inject 0.4 mLs (40 Units total) into the skin at bedtime.  10 mL  12  . levothyroxine (LEVOTHROID) 25 MCG tablet Take 1 tablet (25 mcg total) by mouth daily before breakfast.  30 tablet  0  . meloxicam (MOBIC) 7.5 MG tablet Take 7.5 mg by mouth 2 (two) times daily.      . methocarbamol (ROBAXIN) 500 MG tablet Take 500 mg by mouth 2 (two) times daily as needed for muscle spasms.      Marland Kitchen. SALINE NA Place 1 drop into the nose daily as needed (congestion).      . traZODone (DESYREL) 100 MG tablet Take 100 mg by mouth at bedtime.        No current facility-administered medications for this visit.    Allergies  Allergen Reactions  . Celecoxib Nausea Only  . Ibuprofen Nausea Only    Patient Active Problem List   Diagnosis Date Noted  . Mitral stenosis 10/06/2013  . Chest pain on exertion 08/28/2013  . Diabetes type 2, controlled 08/28/2013  . Chest  pain 08/28/2013  . COPD exacerbation 08/28/2013  . COPD (chronic obstructive pulmonary disease) 10/04/2011  . Tobacco abuse 10/04/2011  . Diabetes mellitus 07/29/2011  . Depression 07/29/2011  . HTN (hypertension) 07/29/2011    History  Smoking status  . Former Smoker -- 1.00 packs/day for 20 years  . Types: Cigarettes  . Quit date: 07/05/2012  Smokeless tobacco  . Never Used    History  Alcohol Use No    Family History  Problem Relation Age of Onset  . Heart disease Mother   . Lung cancer Father     was a former smoker    Review of Systems: Constitutional: no fever chills diaphoresis or fatigue or change in weight.  Head and neck: no hearing loss, no epistaxis, no photophobia or visual disturbance. Respiratory: No cough, shortness of breath or wheezing. Cardiovascular: No chest pain peripheral edema, palpitations. Gastrointestinal: No abdominal distention, no abdominal pain, no change in  bowel habits hematochezia or melena. Genitourinary: No dysuria, no frequency, no urgency, no nocturia. Musculoskeletal:No arthralgias, no back pain, no gait disturbance or myalgias. Neurological: No dizziness, no headaches, no numbness, no seizures, no syncope, no weakness, no tremors. Hematologic: No lymphadenopathy, no easy bruising. Psychiatric: No confusion, no hallucinations, no sleep disturbance.    Physical Exam: Filed Vitals:   10/06/13 1405  BP: 138/60  Pulse: 77   the general appearance reveals a pleasant middle-aged woman in no distress.  She has nasal oxygen in place.Pupils equal and reactive.   Extraocular Movements are full.  There is no scleral icterus.  The mouth and pharynx are normal.  The neck is supple.  The carotids reveal no bruits.  The jugular venous pressure is normal.  The thyroid is not enlarged.  There is no lymphadenopathy.  The chest is clear to percussion and auscultation. There are no rales or rhonchi. Expansion of the chest is symmetrical.  The precordium is quiet.  The first heart sound is normal.  The second heart sound is physiologically split.  There is no murmur gallop rub or click.  There is no abnormal lift or heave.  The abdomen is soft and nontender. Bowel sounds are normal. The liver and spleen are not enlarged. There Are no abdominal masses. There are no bruits.  Extremities show no phlebitis or edema.  Pedal pulses are present.  Strength is normal and symmetrical in all extremities.  There is no lateralizing weakness.  There are no sensory deficits.  Integument reveals what appears to be mild urticaria on her upper extremities.   Assessment / Plan:  From a cardiac standpoint the patient appears to be stable.  She will be seeing her PCP next week concerning her acid reflux symptoms.  We will plan to see her back in cardiology followup for an office visit and EKG in 6 months

## 2013-10-06 NOTE — Assessment & Plan Note (Signed)
The patient is relatively sedentary.  She has not been experiencing any recurrent chest pain.  [name] in a wheelchair today and she is on home oxygen.

## 2013-12-07 ENCOUNTER — Ambulatory Visit: Payer: Medicaid Other

## 2014-01-04 ENCOUNTER — Emergency Department (HOSPITAL_COMMUNITY): Payer: Medicaid Other

## 2014-01-04 ENCOUNTER — Ambulatory Visit (INDEPENDENT_AMBULATORY_CARE_PROVIDER_SITE_OTHER): Payer: Medicaid Other

## 2014-01-04 ENCOUNTER — Telehealth: Payer: Self-pay | Admitting: *Deleted

## 2014-01-04 ENCOUNTER — Emergency Department (HOSPITAL_COMMUNITY)
Admission: EM | Admit: 2014-01-04 | Discharge: 2014-01-04 | Disposition: A | Discharge status: Home / Self Care | Payer: Medicaid Other | Attending: Emergency Medicine | Admitting: Emergency Medicine

## 2014-01-04 ENCOUNTER — Encounter (HOSPITAL_COMMUNITY): Payer: Self-pay | Admitting: Emergency Medicine

## 2014-01-04 VITALS — BP 140/80 | Resp 22

## 2014-01-04 DIAGNOSIS — S46909A Unspecified injury of unspecified muscle, fascia and tendon at shoulder and upper arm level, unspecified arm, initial encounter: Secondary | ICD-10-CM | POA: Insufficient documentation

## 2014-01-04 DIAGNOSIS — Z9981 Dependence on supplemental oxygen: Secondary | ICD-10-CM | POA: Insufficient documentation

## 2014-01-04 DIAGNOSIS — Q828 Other specified congenital malformations of skin: Secondary | ICD-10-CM

## 2014-01-04 DIAGNOSIS — Y9289 Other specified places as the place of occurrence of the external cause: Secondary | ICD-10-CM | POA: Insufficient documentation

## 2014-01-04 DIAGNOSIS — F411 Generalized anxiety disorder: Secondary | ICD-10-CM | POA: Insufficient documentation

## 2014-01-04 DIAGNOSIS — F339 Major depressive disorder, recurrent, unspecified: Secondary | ICD-10-CM | POA: Insufficient documentation

## 2014-01-04 DIAGNOSIS — Z862 Personal history of diseases of the blood and blood-forming organs and certain disorders involving the immune mechanism: Secondary | ICD-10-CM | POA: Insufficient documentation

## 2014-01-04 DIAGNOSIS — E7800 Pure hypercholesterolemia, unspecified: Secondary | ICD-10-CM | POA: Insufficient documentation

## 2014-01-04 DIAGNOSIS — I10 Essential (primary) hypertension: Secondary | ICD-10-CM | POA: Insufficient documentation

## 2014-01-04 DIAGNOSIS — F4001 Agoraphobia with panic disorder: Secondary | ICD-10-CM | POA: Insufficient documentation

## 2014-01-04 DIAGNOSIS — Z79899 Other long term (current) drug therapy: Secondary | ICD-10-CM | POA: Insufficient documentation

## 2014-01-04 DIAGNOSIS — Z87891 Personal history of nicotine dependence: Secondary | ICD-10-CM | POA: Insufficient documentation

## 2014-01-04 DIAGNOSIS — Z794 Long term (current) use of insulin: Secondary | ICD-10-CM | POA: Insufficient documentation

## 2014-01-04 DIAGNOSIS — R296 Repeated falls: Secondary | ICD-10-CM | POA: Insufficient documentation

## 2014-01-04 DIAGNOSIS — Z791 Long term (current) use of non-steroidal anti-inflammatories (NSAID): Secondary | ICD-10-CM | POA: Insufficient documentation

## 2014-01-04 DIAGNOSIS — E119 Type 2 diabetes mellitus without complications: Secondary | ICD-10-CM | POA: Insufficient documentation

## 2014-01-04 DIAGNOSIS — Y9301 Activity, walking, marching and hiking: Secondary | ICD-10-CM | POA: Insufficient documentation

## 2014-01-04 DIAGNOSIS — S4980XA Other specified injuries of shoulder and upper arm, unspecified arm, initial encounter: Secondary | ICD-10-CM | POA: Insufficient documentation

## 2014-01-04 DIAGNOSIS — S20219A Contusion of unspecified front wall of thorax, initial encounter: Secondary | ICD-10-CM | POA: Insufficient documentation

## 2014-01-04 DIAGNOSIS — B351 Tinea unguium: Secondary | ICD-10-CM

## 2014-01-04 DIAGNOSIS — G8929 Other chronic pain: Secondary | ICD-10-CM | POA: Insufficient documentation

## 2014-01-04 DIAGNOSIS — I252 Old myocardial infarction: Secondary | ICD-10-CM | POA: Insufficient documentation

## 2014-01-04 HISTORY — DX: Acute myocardial infarction, unspecified: I21.9

## 2014-01-04 LAB — BASIC METABOLIC PANEL
BUN: 11 mg/dL (ref 6–23)
CHLORIDE: 93 meq/L — AB (ref 96–112)
CO2: 29 meq/L (ref 19–32)
Calcium: 9.3 mg/dL (ref 8.4–10.5)
Creatinine, Ser: 0.84 mg/dL (ref 0.50–1.10)
GFR calc Af Amer: 90 mL/min — ABNORMAL LOW (ref >90)
GFR, EST NON AFRICAN AMERICAN: 77 mL/min — AB (ref >90)
Glucose, Bld: 248 mg/dL — ABNORMAL HIGH (ref 70–99)
POTASSIUM: 3.4 meq/L — AB (ref 3.7–5.3)
SODIUM: 136 meq/L — AB (ref 137–147)

## 2014-01-04 LAB — CBC
HEMATOCRIT: 36.1 % (ref 36.0–46.0)
Hemoglobin: 12.7 g/dL (ref 12.0–15.0)
MCH: 28.8 pg (ref 26.0–34.0)
MCHC: 35.2 g/dL (ref 30.0–36.0)
MCV: 81.9 fL (ref 78.0–100.0)
Platelets: 285 10*3/uL (ref 150–400)
RBC: 4.41 MIL/uL (ref 3.87–5.11)
RDW: 13.6 % (ref 11.5–15.5)
WBC: 10 10*3/uL (ref 4.0–10.5)

## 2014-01-04 LAB — I-STAT TROPONIN, ED: Troponin i, poc: 0 ng/mL (ref 0.00–0.08)

## 2014-01-04 MED ORDER — HYDROCODONE-ACETAMINOPHEN 5-325 MG PO TABS
1.0000 | ORAL_TABLET | Freq: Once | ORAL | Status: AC
  Administered 2014-01-04: 1 via ORAL
  Filled 2014-01-04: qty 1

## 2014-01-04 MED ORDER — HYDROCODONE-ACETAMINOPHEN 5-325 MG PO TABS: 1.0000 | ORAL_TABLET | ORAL | Status: DC | PRN

## 2014-01-04 NOTE — ED Provider Notes (Addendum)
CSN: [id number]     Arrival date & time 01/04/14  1644 History   First MD Initiated Contact with Patient 01/04/14 1705     Chief Complaint  Patient presents with  . Chest Pain     (Consider location/radiation/quality/duration/timing/severity/associated sxs/prior Treatment) Patient is a 53 y.o. female presenting with chest pain. The history is provided by the patient.  Chest Pain Pain location:  R chest Pain quality: sharp and shooting   Pain radiates to:  Does not radiate Pain radiates to the back: no   Pain severity:  Severe Onset quality:  Gradual Duration:  16 hours Timing:  Constant Progression:  Worsening Chronicity:  New Context: breathing   Context comment:  She had mechanical fall yesterday while walking out to the bus and her legs went out and she fell on her tailbone and then over to her right side. Relieved by:  Rest Worsened by:  Deep breathing, coughing and movement Ineffective treatments:  None tried Associated symptoms: no abdominal pain, no back pain, no cough, no fatigue, no fever, no nausea, no palpitations, no shortness of breath, not vomiting and no weakness   Risk factors: diabetes mellitus and hypertension   Risk factors: no immobilization, no prior DVT/PE, no smoking and no surgery     Past Medical History  Diagnosis Date  . Diabetes mellitus   . Hypertension   . Anemia   . Chronic back pain   . Neuropathy   . Hypercholesterolemia   . Major depressive disorder, recurrent   . Generalized anxiety disorder   . Panic disorder with agoraphobia   . Tobacco abuse   . MI (myocardial infarction) 2007   Past Surgical History  Procedure Laterality Date  . Foot surgery    . Back surgery    . Hernia repair    . Tonsillectomy and adenoidectomy    . Shoulder surgery    . Knee surgery    . Colon surgery    . Breast surgery     Family History  Problem Relation Age of Onset  . Heart disease Mother   . Lung cancer Father     was a former smoker    History  Substance Use Topics  . Smoking status: Former Smoker -- 1.00 packs/day for 20 years    Types: Cigarettes    Quit date: 07/05/2012  . Smokeless tobacco: Never Used  . Alcohol Use: No   OB History   Grav Para Term Preterm Abortions TAB SAB Ect Mult Living                 Review of Systems  Constitutional: Negative for fever and fatigue.  Respiratory: Negative for cough and shortness of breath.   Cardiovascular: Positive for chest pain. Negative for palpitations.  Gastrointestinal: Negative for nausea, vomiting and abdominal pain.  Musculoskeletal: Negative for back pain.  Neurological: Negative for weakness.  All other systems reviewed and are negative.     Allergies  Celecoxib and Ibuprofen  Home Medications   Prior to Admission medications   Medication Sig Start Date End Date Taking? Authorizing Provider  acetaminophen (TYLENOL) 500 MG tablet Take 1 tablet (500 mg total) by mouth every 6 (six) hours as needed. 09/11/13   Antony MaduraKelly Humes, PA-C  albuterol (PROAIR HFA) 108 (90 BASE) MCG/ACT inhaler Inhale 1 puff into the lungs 2 (two) times daily as needed for wheezing or shortness of breath.    Historical Provider, MD  ALPRAZolam Prudy Feeler(XANAX) 0.5 MG tablet Take 0.5 mg by  mouth daily as needed for anxiety.    Historical Provider, MD  ARIPiprazole (ABILIFY) 5 MG tablet Take 5 mg by mouth daily.    Historical Provider, MD  buPROPion (WELLBUTRIN SR) 200 MG 12 hr tablet Take 200 mg by mouth 2 (two) times daily.    Historical Provider, MD  cetirizine (ZYRTEC) 10 MG tablet Take 10 mg by mouth daily as needed for allergies.    Historical Provider, MD  FLUoxetine (PROZAC) 40 MG capsule Take 40 mg by mouth daily.    Historical Provider, MD  glimepiride (AMARYL) 4 MG tablet Take 4 mg by mouth daily.    Historical Provider, MD  hydrochlorothiazide (HYDRODIURIL) 25 MG tablet Take 25 mg by mouth daily.    Historical Provider, MD  insulin aspart (NOVOLOG) 100 UNIT/ML injection Inject 8  Units into the skin 3 (three) times daily as needed for high blood sugar (CBG >100).     Historical Provider, MD  insulin glargine (LANTUS) 100 UNIT/ML injection Inject 0.4 mLs (40 Units total) into the skin at bedtime. 08/30/13   Leroy SeaPrashant K Singh, MD  levothyroxine (LEVOTHROID) 25 MCG tablet Take 1 tablet (25 mcg total) by mouth daily before breakfast. 08/30/13   Leroy SeaPrashant K Singh, MD  meloxicam (MOBIC) 7.5 MG tablet Take 7.5 mg by mouth 2 (two) times daily.    Historical Provider, MD  methocarbamol (ROBAXIN) 500 MG tablet Take 500 mg by mouth 2 (two) times daily as needed for muscle spasms.    Historical Provider, MD  SALINE NA Place 1 drop into the nose daily as needed (congestion).    Historical Provider, MD  traZODone (DESYREL) 100 MG tablet Take 100 mg by mouth at bedtime.     Historical Provider, MD   BP 142/61  Pulse 67  Temp(Src) 98.7 F (37.1 C) (Oral)  Resp 17  SpO2 97% Physical Exam  Nursing note and vitals reviewed. Constitutional: [name] oriented to person, place, and time. She appears well-developed and well-nourished. No distress.  HENT:  Head: Normocephalic and atraumatic.  Mouth/Throat: Oropharynx is clear and moist.  Eyes: Conjunctivae and EOM are normal. Pupils are equal, round, and reactive to light.  Neck: Normal range of motion. Neck supple.  Cardiovascular: Normal rate, regular rhythm and intact distal pulses.   No murmur heard. Pulmonary/Chest: Effort normal and breath sounds normal. No respiratory distress. She has no wheezes. She has no rales. She exhibits tenderness and bony tenderness. She exhibits no crepitus.    Abdominal: Soft. She exhibits no distension. There is no tenderness. There is no rebound and no guarding.  Musculoskeletal: Normal range of motion. She exhibits no edema.       Right shoulder: She exhibits tenderness. She exhibits normal range of motion.  Tenderness over the right shoulder AC joint  Neurological: [name] alert and oriented to person,  place, and time.  Skin: Skin is warm and dry. No rash noted. No erythema.  Psychiatric: She has a normal mood and affect. Her behavior is normal.    ED Course  Procedures (including critical care time) Labs Review Labs Reviewed  BASIC METABOLIC PANEL - Abnormal; Notable for the following:    Sodium 136 (*)    Potassium 3.4 (*)    Chloride 93 (*)    Glucose, Bld 248 (*)    GFR calc non Af Amer 77 (*)    GFR calc Af Amer 90 (*)    All other components within normal limits  CBC  I-STAT TROPOININ, ED  Imaging Review Dg Ribs Unilateral W/chest Right  01/04/2014   CLINICAL DATA:  Fall, right rib pain  EXAM: RIGHT RIBS AND CHEST - 3+ VIEW  COMPARISON:  None.  FINDINGS: No fracture or other bone lesions are seen involving the ribs. There is no evidence of pneumothorax or pleural effusion. Both lungs are clear. Heart size and mediastinal contours are within normal limits. Incompletely imaged anterior cervical stabilization hardware and left shoulder arthroplasty prosthesis. Enlargement of the cardiopericardial silhouette is likely exaggerated by a frontal portable technique. The degree of cardiomegaly is likely unchanged compared to prior.  IMPRESSION: 1. No acute fracture. 2. Stable cardiomegaly.   Electronically Signed   By: Malachy Moan M.D.   On: 01/04/2014 20:32   Dg Shoulder Right  01/04/2014   CLINICAL DATA:  Fall, shoulder pain  EXAM: RIGHT SHOULDER - 2+ VIEW  COMPARISON:  Concurrently obtained chest and rib series  FINDINGS: There is no evidence of fracture or dislocation. There is no evidence of arthropathy or other focal bone abnormality. Soft tissues are unremarkable.  IMPRESSION: Negative.   Electronically Signed   By: Malachy Moan M.D.   On: 01/04/2014 20:32     EKG Interpretation   Date/Time:  Tuesday January 04 2014 16:53:39 EDT Ventricular Rate:  72 PR Interval:  159 QRS Duration: 84 QT Interval:  449 QTC Calculation: 491 R Axis:   11 Text Interpretation:   Sinus rhythm Consider left atrial enlargement  Borderline prolonged QT interval No significant change since last tracing  Confirmed by Anitra Lauth  MD, Alphonzo Lemmings ([id number]) on 01/04/2014 5:05:21 PM      MDM   Final diagnoses:  Chest wall contusion    Patient presents with right-sided chest pain that is pleuritic in nature without shortness of breath or cough. It started hours after a mechanical fall yesterday. She states the pain has worsened throughout the day but has not tried taking anything for it. She appears to be in pain with taking deep breaths but is satting 98% on her baseline 2 L of oxygen without wheezing. Low suspicion for cardiac etiology at this time. Also low suspicion for PE. He'll most likely this is traumatic in nature. [name] neurovascularly intact.  EKG unchanged from prior.  CBC, BMP, troponin, chest x-ray with rib films and right shoulder films pending. Patient given by mouth Vicodin for pain control.  9:43 PM Pt has neg labs and imaging.  Feeling better after vicodin.  Willd /c home.  Gwyneth Sprout, MD 01/04/14 6045  Gwyneth Sprout, MD 01/04/14 2145

## 2014-01-04 NOTE — ED Notes (Signed)
Spoke with Dr. Anitra LauthPlunkett, will prepare patient for discharge.

## 2014-01-04 NOTE — ED Notes (Signed)
Patient transported to X-ray 

## 2014-01-04 NOTE — Telephone Encounter (Signed)
Pt complained of chest pain right side, difficulty catching her breathe and tightness in the chest.  I asked Lottie Ratershley Prevette to call EMT for transport of possible cardiac pt.  I asked pt if she had an injury, pt denies.  I checked her BP 140/80 and R 22 puffing and pursing her lips.  I asked the pt if her O2 was on the proper setting she stated it was.  The EMT arrived and began questioning the pt.  Approximately 422pm.

## 2014-01-04 NOTE — ED Notes (Signed)
Discussed with Dr. Tanna SavoyPlunket that patient is concerned about her night time insulin coverage. MD acknowledges and has reviewed X-rays. She will visit patient and update.

## 2014-01-04 NOTE — ED Notes (Signed)
Communication with Radiology. Pt is ready for Xray

## 2014-01-04 NOTE — ED Notes (Signed)
53 yo female via [facility] reports woken up out of her sleep at 0900 with Right Sided chest pain 9/10. Sent here from Podietrist office with chest pain. Pt. Reports falling last night. HX MI, DM, 3L oxygen dependant. Received 324 ASA per EMS.  Vitals 140/82 72 HR 18 Resp CBG 250

## 2014-01-04 NOTE — ED Notes (Signed)
Pt is very tearful "I'm worried about how I'm going to get home. I don't have any money."

## 2014-01-04 NOTE — Progress Notes (Signed)
Patient presented to office today to have her toenails trimmed and the callus on the left foot shaved down. Patient was found to be having trouble breathing by another patient in the waiting room and we immediately got her into a treatment room to assess. Went to get to get nurse, Alphia KavaValery O'Connell, to assess the patient.  Patient was seen by Dairl PonderValerie O'Connell nursing staff verified her vitals patient was having a two-day history of chest pain there is also a history of fall and injury to her chest or side. Patient is describing chest pain and difficulty breathing and it was determined medially that she would be best served by transfer to the emergency room ambulance was called immediately and patient was taken from treatment room without being evaluated for any foot issues at this time. Patient is advised her heart and breathing were a more urgent concern at this time despite her protests we did contact emergency services ambulance transfer her to the hospital. Patient was on oxygen. The well hydrated and oxygenated however continued to have chest pain and respiratory distress will follow patient in the future on an as-needed basis for any foot care needs. No charges for today's visit  Alvan Dameichard Dev Dhondt DPM

## 2014-01-04 NOTE — ED Notes (Signed)
Patient returned from X-ray 

## 2014-01-04 NOTE — ED Notes (Signed)
Patient still off the unit for testing 

## 2014-01-04 NOTE — Discharge Instructions (Signed)
Chest Contusion  A contusion is a deep bruise. Bruises happen when an injury causes bleeding under the skin. Signs of bruising include pain, puffiness (swelling), and discolored skin. The bruise may turn blue, purple, or yellow.   HOME CARE  · Put ice on the injured area.  · Put ice in a plastic bag.  · Place a towel between the skin and the bag.  · Leave the ice on for 15-20 minutes at a time, 03-04 times a day for the first 48 hours.  · Only take medicine as told by your doctor.  · Rest.  · Take deep breaths (deep-breathing exercises) as told by your doctor.  · Stop smoking if you smoke.  · Do not lift objects over 5 pounds (2.3 kilograms) for 3 days or longer if told by your doctor.  GET HELP RIGHT AWAY IF:   · You have more bruising or puffiness.  · You have pain that gets worse.  · You have trouble breathing.  · You are dizzy, weak, or pass out (faint).  · You have blood in your pee (urine) or poop (stool).  · You cough up or throw up (vomit) blood.  · Your puffiness or pain is not helped with medicines.  MAKE SURE YOU:   · Understand these instructions.  · Will watch your condition.  · Will get help right away if you are not doing well or get worse.  Document Released: 02/26/2008 Document Revised: 06/03/2012 Document Reviewed: 03/02/2012  [employer]® Patient Information ©2014 [employer].

## 2014-01-18 ENCOUNTER — Ambulatory Visit (INDEPENDENT_AMBULATORY_CARE_PROVIDER_SITE_OTHER): Payer: Medicaid Other

## 2014-01-18 VITALS — BP 163/84 | HR 68 | Resp 12

## 2014-01-18 DIAGNOSIS — E1149 Type 2 diabetes mellitus with other diabetic neurological complication: Secondary | ICD-10-CM

## 2014-01-18 DIAGNOSIS — M79609 Pain in unspecified limb: Secondary | ICD-10-CM

## 2014-01-18 DIAGNOSIS — R269 Unspecified abnormalities of gait and mobility: Secondary | ICD-10-CM

## 2014-01-18 DIAGNOSIS — B351 Tinea unguium: Secondary | ICD-10-CM

## 2014-01-18 DIAGNOSIS — Q828 Other specified congenital malformations of skin: Secondary | ICD-10-CM

## 2014-01-18 DIAGNOSIS — E1142 Type 2 diabetes mellitus with diabetic polyneuropathy: Secondary | ICD-10-CM

## 2014-01-18 DIAGNOSIS — Q6600 Congenital talipes equinovarus, unspecified foot: Secondary | ICD-10-CM

## 2014-01-18 DIAGNOSIS — E1140 Type 2 diabetes mellitus with diabetic neuropathy, unspecified: Secondary | ICD-10-CM

## 2014-01-18 NOTE — Progress Notes (Signed)
   Subjective:    Patient ID: [name], female    DOB: 1960/10/13, 53 y.o.   MRN: [id number]  HPI TOENAILS TRIM.   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 Patient has plantar flexed fifth metatarsal plantar flexed first metatarsal with associated keratoses sub-5 bilateral sub-1 left sub-5 left show some pre-ulcer hemorrhage a keratoses no signs of infection noted nails thick brittle criptotic incurvated 1 through 5 bilateral severe rigid digital contractures are noted bilateral decreased sensation [facility] testing to the forefoot digits and arch due to diabetes and neuropathy as noted on [facility]. 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 Neosporin and Band-Aid applied to the fifth MTP sites on debridement patient been taking her per modified shoes in your future reappointed 3 months for continued diabetic foot and palliative nail care is needed next  Alvan Dameichard Chinelo Benn DPM

## 2014-01-18 NOTE — Patient Instructions (Signed)
Diabetes and Foot Care Diabetes may cause you to have problems because of poor blood supply (circulation) to your feet and legs. This may cause the skin on your feet to become thinner, break easier, and heal more slowly. Your skin may become dry, and the skin may peel and crack. You may also have nerve damage in your legs and feet causing decreased feeling in them. You may not notice minor injuries to your feet that could lead to infections or more serious problems. Taking care of your feet is one of the most important things you can do for yourself.  HOME CARE INSTRUCTIONS  Wear shoes at all times, even in the house. Do not go barefoot. Bare feet are easily injured.  Check your feet daily for blisters, cuts, and redness. If you cannot see the bottom of your feet, use a mirror or ask someone for help.  Wash your feet with warm water (do not use hot water) and mild soap. Then pat your feet and the areas between your toes until they are completely dry. Do not soak your feet as this can dry your skin.  Apply a moisturizing lotion or petroleum jelly (that does not contain alcohol and is unscented) to the skin on your feet and to dry, brittle toenails. Do not apply lotion between your toes.  Trim your toenails straight across. Do not dig under them or around the cuticle. File the edges of your nails with an emery board or nail file.  Do not cut corns or calluses or try to remove them with medicine.  Wear clean socks or stockings every day. Make sure they are not too tight. Do not wear knee-high stockings since they may decrease blood flow to your legs.  Wear shoes that fit properly and have enough cushioning. To break in new shoes, wear them for just a few hours a day. This prevents you from injuring your feet. Always look in your shoes before you put them on to be sure there are no objects inside.  Do not cross your legs. This may decrease the blood flow to your feet.  If you find a minor scrape,  cut, or break in the skin on your feet, keep it and the skin around it clean and dry. These areas may be cleansed with mild soap and water. Do not cleanse the area with peroxide, alcohol, or iodine.  When you remove an adhesive bandage, be sure not to damage the skin around it.  If you have a wound, look at it several times a day to make sure it is healing.  Do not use heating pads or hot water bottles. They may burn your skin. If you have lost feeling in your feet or legs, you may not know it is happening until it is too late.  Make sure your health care provider performs a complete foot exam at least annually or more often if you have foot problems. Report any cuts, sores, or bruises to your health care provider immediately. SEEK MEDICAL CARE IF:   You have an injury that is not healing.  You have cuts or breaks in the skin.  You have an ingrown nail.  You notice redness on your legs or feet.  You feel burning or tingling in your legs or feet.  You have pain or cramps in your legs and feet.  Your legs or feet are numb.  Your feet always feel cold. SEEK IMMEDIATE MEDICAL CARE IF:   There is increasing redness,   swelling, or pain in or around a wound.  There is a red line that goes up your leg.  Pus is coming from a wound.  You develop a fever or as directed by your health care provider.  You notice a bad smell coming from an ulcer or wound. Document Released: 09/06/2000 Document Revised: 05/12/2013 Document Reviewed: 02/16/2013 [employer] Patient Information 2014 [employer].  

## 2014-03-24 ENCOUNTER — Telehealth: Payer: Self-pay | Admitting: Cardiology

## 2014-03-24 NOTE — Telephone Encounter (Signed)
Left message that I had not tried to reach her, call back if she needs anything

## 2014-03-24 NOTE — Telephone Encounter (Signed)
New message  ° ° °Patient calling stating someone called her today.  °

## 2014-04-05 ENCOUNTER — Ambulatory Visit: Payer: Medicaid Other | Admitting: Cardiology

## 2014-04-19 ENCOUNTER — Ambulatory Visit (INDEPENDENT_AMBULATORY_CARE_PROVIDER_SITE_OTHER): Payer: Medicaid Other

## 2014-04-19 DIAGNOSIS — Q828 Other specified congenital malformations of skin: Secondary | ICD-10-CM | POA: Diagnosis not present

## 2014-04-19 DIAGNOSIS — M79606 Pain in leg, unspecified: Secondary | ICD-10-CM

## 2014-04-19 DIAGNOSIS — M79609 Pain in unspecified limb: Secondary | ICD-10-CM | POA: Diagnosis not present

## 2014-04-19 DIAGNOSIS — B351 Tinea unguium: Secondary | ICD-10-CM

## 2014-04-19 DIAGNOSIS — E1149 Type 2 diabetes mellitus with other diabetic neurological complication: Secondary | ICD-10-CM | POA: Diagnosis not present

## 2014-04-19 DIAGNOSIS — E1142 Type 2 diabetes mellitus with diabetic polyneuropathy: Secondary | ICD-10-CM

## 2014-04-19 DIAGNOSIS — Q6600 Congenital talipes equinovarus, unspecified foot: Secondary | ICD-10-CM

## 2014-04-19 DIAGNOSIS — E1140 Type 2 diabetes mellitus with diabetic neuropathy, unspecified: Secondary | ICD-10-CM

## 2014-04-19 DIAGNOSIS — R269 Unspecified abnormalities of gait and mobility: Secondary | ICD-10-CM

## 2014-04-19 NOTE — Patient Instructions (Signed)
Diabetes and Foot Care Diabetes may cause you to have problems because of poor blood supply (circulation) to your feet and legs. This may cause the skin on your feet to become thinner, break easier, and heal more slowly. Your skin may become dry, and the skin may peel and crack. You may also have nerve damage in your legs and feet causing decreased feeling in them. You may not notice minor injuries to your feet that could lead to infections or more serious problems. Taking care of your feet is one of the most important things you can do for yourself.  HOME CARE INSTRUCTIONS  Wear shoes at all times, even in the house. Do not go barefoot. Bare feet are easily injured.  Check your feet daily for blisters, cuts, and redness. If you cannot see the bottom of your feet, use a mirror or ask someone for help.  Wash your feet with warm water (do not use hot water) and mild soap. Then pat your feet and the areas between your toes until they are completely dry. Do not soak your feet as this can dry your skin.  Apply a moisturizing lotion or petroleum jelly (that does not contain alcohol and is unscented) to the skin on your feet and to dry, brittle toenails. Do not apply lotion between your toes.  Trim your toenails straight across. Do not dig under them or around the cuticle. File the edges of your nails with an emery board or nail file.  Do not cut corns or calluses or try to remove them with medicine.  Wear clean socks or stockings every day. Make sure they are not too tight. Do not wear knee-high stockings since they may decrease blood flow to your legs.  Wear shoes that fit properly and have enough cushioning. To break in new shoes, wear them for just a few hours a day. This prevents you from injuring your feet. Always look in your shoes before you put them on to be sure there are no objects inside.  Do not cross your legs. This may decrease the blood flow to your feet.  If you find a minor scrape,  cut, or break in the skin on your feet, keep it and the skin around it clean and dry. These areas may be cleansed with mild soap and water. Do not cleanse the area with peroxide, alcohol, or iodine.  When you remove an adhesive bandage, be sure not to damage the skin around it.  If you have a wound, look at it several times a day to make sure it is healing.  Do not use heating pads or hot water bottles. They may burn your skin. If you have lost feeling in your feet or legs, you may not know it is happening until it is too late.  Make sure your health care provider performs a complete foot exam at least annually or more often if you have foot problems. Report any cuts, sores, or bruises to your health care provider immediately. SEEK MEDICAL CARE IF:   You have an injury that is not healing.  You have cuts or breaks in the skin.  You have an ingrown nail.  You notice redness on your legs or feet.  You feel burning or tingling in your legs or feet.  You have pain or cramps in your legs and feet.  Your legs or feet are numb.  Your feet always feel cold. SEEK IMMEDIATE MEDICAL CARE IF:   There is increasing redness,   swelling, or pain in or around a wound.  There is a red line that goes up your leg.  Pus is coming from a wound.  You develop a fever or as directed by your health care provider.  You notice a bad smell coming from an ulcer or wound. Document Released: 09/06/2000 Document Revised: 05/12/2013 Document Reviewed: 02/16/2013 Exit[employer] Patient Information 2015 [employer]. This information is not intended to replace advice given to you by your health care provider. Make sure you discuss any questions you have with your health care provider.  

## 2014-04-19 NOTE — Progress Notes (Addendum)
   Subjective:    Patient ID: [name], female    DOB: [birth year], 53 y.o.   MRN: [id number]  HPI Comments: Pt states Dr Ralene CorkSikora examines and trims her feet and toenails     Review of Systems no new findings or systemic changes noted     Objective:   Physical Exam Lotion the objective findings as follows vascular status is intact with pedal pulses palpable DP postal for bilateral PT plus one over 4 bilateral capillary refill 3 seconds all digits patient is a cavus foot type with plantarflexed forefoot there is keratoses sub-15 right and sub-5 right is hemorrhage a keratoses or pre-ulcer sub-5 left. No open wounds or active ulceration no bleeding or discharge noted no signs of infection noted nails thick brittle crumbly friable incurvated 1 through 5 bilateral tender both on palpation and with enclosed shoe wear. 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 Nails debrided 1 through 5 bilateral painful mycotic dystrophic incurvated nails. Also this time debridement multiple keratoses sub-5 bilateral sub-1 left Neosporin and Band-Aid applied to the fifth MTP area left foot monitor for any discharge drainage or any signs of infection contact us immediately if something occurs otherwise maintain 3 month followup   Alvan Dameichard Laporshia Hogen DPM

## 2014-05-11 ENCOUNTER — Ambulatory Visit: Payer: Medicaid Other | Admitting: Cardiology

## 2014-06-14 ENCOUNTER — Ambulatory Visit: Payer: Medicaid Other | Admitting: Cardiology

## 2014-07-14 ENCOUNTER — Ambulatory Visit: Payer: Self-pay | Admitting: Internal Medicine

## 2014-07-26 ENCOUNTER — Encounter: Payer: Medicaid Other | Admitting: Podiatry

## 2014-07-26 ENCOUNTER — Ambulatory Visit: Payer: Medicaid Other

## 2014-07-26 NOTE — Progress Notes (Signed)
   Subjective:    Patient ID: [name], female    DOB: [birth year], 53 y.o.   MRN: [id number]  HPI  Patient presents for nail debridement  Review of Systems     Objective:   Physical Exam        Assessment & Plan:   This encounter was created in error - please disregard.

## 2014-07-28 ENCOUNTER — Other Ambulatory Visit: Payer: Self-pay

## 2014-07-28 DIAGNOSIS — Z1231 Encounter for screening mammogram for malignant neoplasm of breast: Secondary | ICD-10-CM

## 2014-08-05 ENCOUNTER — Ambulatory Visit: Payer: Medicaid Other | Admitting: Cardiology

## 2014-08-19 ENCOUNTER — Ambulatory Visit: Payer: Self-pay | Admitting: Internal Medicine

## 2014-09-27 ENCOUNTER — Ambulatory Visit (INDEPENDENT_AMBULATORY_CARE_PROVIDER_SITE_OTHER): Payer: Medicaid Other

## 2014-09-27 DIAGNOSIS — M79673 Pain in unspecified foot: Secondary | ICD-10-CM

## 2014-09-27 DIAGNOSIS — Q828 Other specified congenital malformations of skin: Secondary | ICD-10-CM

## 2014-09-27 DIAGNOSIS — E1140 Type 2 diabetes mellitus with diabetic neuropathy, unspecified: Secondary | ICD-10-CM

## 2014-09-27 DIAGNOSIS — B351 Tinea unguium: Secondary | ICD-10-CM

## 2014-09-27 NOTE — Patient Instructions (Signed)
Diabetes and Foot Care Diabetes may cause you to have problems because of poor blood supply (circulation) to your feet and legs. This may cause the skin on your feet to become thinner, break easier, and heal more slowly. Your skin may become dry, and the skin may peel and crack. You may also have nerve damage in your legs and feet causing decreased feeling in them. You may not notice minor injuries to your feet that could lead to infections or more serious problems. Taking care of your feet is one of the most important things you can do for yourself.  HOME CARE INSTRUCTIONS  Wear shoes at all times, even in the house. Do not go barefoot. Bare feet are easily injured.  Check your feet daily for blisters, cuts, and redness. If you cannot see the bottom of your feet, use a mirror or ask someone for help.  Wash your feet with warm water (do not use hot water) and mild soap. Then pat your feet and the areas between your toes until they are completely dry. Do not soak your feet as this can dry your skin.  Apply a moisturizing lotion or petroleum jelly (that does not contain alcohol and is unscented) to the skin on your feet and to dry, brittle toenails. Do not apply lotion between your toes.  Trim your toenails straight across. Do not dig under them or around the cuticle. File the edges of your nails with an emery board or nail file.  Do not cut corns or calluses or try to remove them with medicine.  Wear clean socks or stockings every day. Make sure they are not too tight. Do not wear knee-high stockings since they may decrease blood flow to your legs.  Wear shoes that fit properly and have enough cushioning. To break in new shoes, wear them for just a few hours a day. This prevents you from injuring your feet. Always look in your shoes before you put them on to be sure there are no objects inside.  Do not cross your legs. This may decrease the blood flow to your feet.  If you find a minor scrape,  cut, or break in the skin on your feet, keep it and the skin around it clean and dry. These areas may be cleansed with mild soap and water. Do not cleanse the area with peroxide, alcohol, or iodine.  When you remove an adhesive bandage, be sure not to damage the skin around it.  If you have a wound, look at it several times a day to make sure it is healing.  Do not use heating pads or hot water bottles. They may burn your skin. If you have lost feeling in your feet or legs, you may not know it is happening until it is too late.  Make sure your health care provider performs a complete foot exam at least annually or more often if you have foot problems. Report any cuts, sores, or bruises to your health care provider immediately. SEEK MEDICAL CARE IF:   You have an injury that is not healing.  You have cuts or breaks in the skin.  You have an ingrown nail.  You notice redness on your legs or feet.  You feel burning or tingling in your legs or feet.  You have pain or cramps in your legs and feet.  Your legs or feet are numb.  Your feet always feel cold. SEEK IMMEDIATE MEDICAL CARE IF:   There is increasing redness,   swelling, or pain in or around a wound.  There is a red line that goes up your leg.  Pus is coming from a wound.  You develop a fever or as directed by your health care provider.  You notice a bad smell coming from an ulcer or wound. Document Released: 09/06/2000 Document Revised: 05/12/2013 Document Reviewed: 02/16/2013 Exit[employer] Patient Information 2015 [employer]. This information is not intended to replace advice given to you by your health care provider. Make sure you discuss any questions you have with your health care provider.  

## 2014-09-27 NOTE — Progress Notes (Signed)
   Subjective:    Patient ID: Elizabeth BuryMary J [name], female    DOB: December 09, 1960, 54 y.o.   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 No open wounds no ulcers no secondary infections nails thick brittle Crumley friable dystrophic 1 through 5 bilateral nails criptotic incurvated and dystrophic with discoloration and friability noted.       Assessment & Plan:  Assessment this time his diabetes with history peripheral neuropathy and angiopathy related of nails 1 through 5 painful mycotic nails debrided at this time the presence of pain symptomology diabetes also keratoses sub-5 bilateral sub-1 left are debrided return for future palliative nail care in 3 months as recommended  [name] DPM

## 2014-09-28 ENCOUNTER — Ambulatory Visit: Payer: Self-pay

## 2014-09-30 ENCOUNTER — Ambulatory Visit: Payer: Medicaid Other | Admitting: Cardiology

## 2014-10-03 ENCOUNTER — Ambulatory Visit: Payer: Self-pay | Admitting: Internal Medicine

## 2014-10-24 ENCOUNTER — Ambulatory Visit: Payer: Self-pay | Admitting: Internal Medicine

## 2014-10-25 ENCOUNTER — Ambulatory Visit: Payer: Medicaid Other | Admitting: Cardiology

## 2014-10-31 ENCOUNTER — Institutional Professional Consult (permissible substitution): Payer: Self-pay | Admitting: Internal Medicine

## 2014-11-01 ENCOUNTER — Ambulatory Visit: Payer: Self-pay

## 2014-11-15 ENCOUNTER — Encounter: Payer: Self-pay | Admitting: Cardiology

## 2014-11-15 ENCOUNTER — Ambulatory Visit (INDEPENDENT_AMBULATORY_CARE_PROVIDER_SITE_OTHER): Payer: Medicaid Other | Admitting: Cardiology

## 2014-11-15 VITALS — BP 108/72 | HR 93 | Ht 61.0 in | Wt 216.0 lb

## 2014-11-15 DIAGNOSIS — I050 Rheumatic mitral stenosis: Secondary | ICD-10-CM

## 2014-11-15 DIAGNOSIS — R079 Chest pain, unspecified: Secondary | ICD-10-CM

## 2014-11-15 DIAGNOSIS — E119 Type 2 diabetes mellitus without complications: Secondary | ICD-10-CM

## 2014-11-15 DIAGNOSIS — J441 Chronic obstructive pulmonary disease with (acute) exacerbation: Secondary | ICD-10-CM

## 2014-11-15 NOTE — Patient Instructions (Signed)
Your physician recommends that you continue on your current medications as directed. Please refer to the Current Medication list given to you today.  Your physician wants you to follow-up in: 1 year. You will receive a reminder letter in the mail two months in advance. If you don't receive a letter, please call our office to schedule the follow-up appointment.  

## 2014-11-15 NOTE — Progress Notes (Signed)
Cardiology Office Note   Date:  11/15/2014   ID:  [name], [name] [birth year], MRN [id number]  PCP:  [name], MD  Cardiologist:    Cassell Clement, MD   No chief complaint on file.     History of Present Illness: [name] is a 54 y.o. female who presents for follow-up office visit  This pleasant 54 year old woman is seen for a follow-up office visit.  We initially saw her in 2014 when she was admitted with chest pain.  She ruled out for myocardial infarction. She had an echocardiogram on 08/28/13 which showed an ejection fraction of 50-55% with normal systolic function and with mild mitral stenosis. She had a Lexi scan Myoview stress test on 08/29/13 which showed no evidence of ischemia and her ejection fraction was normal at 73%. The patient gives a history of having had rheumatic fever when she was in the first grade. This would correlate with her mild mitral stenosis seen on echo. She does not have any history of atrial fibrillation or thromboembolic disease. [name] a former smoker and has a history of COPD. She also has a history of acid reflux. Since last visit she has had problems with gaining unwanted weight.  This has resulted in worsening of her acid reflux as well as making control of her diabetes more difficult.  Her PCP has had to increase her insulin dose. The patient has not been experiencing any peripheral edema.  She sleeps on one to 2 pillows.  She will occasionally oh awaken short of breath at night.  She has not been aware of any racing of her heart.  Past Medical History  Diagnosis Date  . Diabetes mellitus   . Hypertension   . Anemia   . Chronic back pain   . Neuropathy   . Hypercholesterolemia   . Major depressive disorder, recurrent   . Generalized anxiety disorder   . Panic disorder with agoraphobia   . Tobacco abuse   . MI (myocardial infarction) 2007    Past Surgical History  Procedure Laterality Date  . Foot surgery    .  Back surgery    . Hernia repair    . Tonsillectomy and adenoidectomy    . Shoulder surgery    . Knee surgery    . Colon surgery    . Breast surgery       Current Outpatient Prescriptions  Medication Sig Dispense Refill  . acetaminophen (TYLENOL) 500 MG tablet Take 1 tablet (500 mg total) by mouth every 6 (six) hours as needed. 30 tablet 0  . albuterol (PROAIR HFA) 108 (90 BASE) MCG/ACT inhaler Inhale 1 puff into the lungs 2 (two) times daily as needed for wheezing or shortness of breath.    . ALPRAZolam (XANAX) 0.5 MG tablet Take 0.5 mg by mouth daily as needed for anxiety.    . ARIPiprazole (ABILIFY) 5 MG tablet Take 5 mg by mouth daily.    Marland Kitchen buPROPion (WELLBUTRIN SR) 200 MG 12 hr tablet Take 200 mg by mouth 2 (two) times daily.    . cetirizine (ZYRTEC) 10 MG tablet Take 10 mg by mouth daily as needed for allergies.    Marland Kitchen FLUoxetine (PROZAC) 40 MG capsule Take 40 mg by mouth daily.    Marland Kitchen glimepiride (AMARYL) 4 MG tablet Take 4 mg by mouth daily.    . hydrochlorothiazide (HYDRODIURIL) 25 MG tablet Take 25 mg by mouth daily.    Marland Kitchen HYDROcodone-acetaminophen (NORCO/VICODIN) 5-325 MG per  tablet Take 1 tablet by mouth every 4 (four) hours as needed. 10 tablet 0  . insulin aspart (NOVOLOG) 100 UNIT/ML injection Inject 8 Units into the skin 3 (three) times daily as needed for high blood sugar (CBG >100).     . insulin glargine (LANTUS) 100 UNIT/ML injection Inject 0.4 mLs (40 Units total) into the skin at bedtime. 10 mL 12  . levothyroxine (LEVOTHROID) 25 MCG tablet Take 1 tablet (25 mcg total) by mouth daily before breakfast. 30 tablet 0  . meloxicam (MOBIC) 7.5 MG tablet Take 7.5 mg by mouth 2 (two) times daily.    . methocarbamol (ROBAXIN) 500 MG tablet Take 500 mg by mouth 2 (two) times daily as needed for muscle spasms.     No current facility-administered medications for this visit.    Allergies:   Trazodone and nefazodone; Celecoxib; and Ibuprofen    Social History:  The patient   reports that she quit smoking about 2 years ago. Her smoking use included Cigarettes. She has a 20 pack-year smoking history. She has never used smokeless tobacco. She reports that she does not drink alcohol or use illicit drugs.   Family History:  The patient's family history includes Heart disease in her mother; Lung cancer in her father.    ROS:  Please see the history of present illness.   Otherwise, review of systems are positive for none.   All other systems are reviewed and negative.    PHYSICAL EXAM: VS:  BP 108/72 mmHg  Pulse 93  Ht 5\' 1"  (1.549 m)  Wt 216 lb (97.977 kg)  BMI 40.83 kg/m2 , BMI Body mass index is 40.83 kg/(m^2). GEN: Well nourished, well developed, in no acute distress HEENT: normal Neck: no JVD, carotid bruits, or masses Cardiac: RRR; no murmurs, rubs, or gallops,no edema  Respiratory:  clear to auscultation bilaterally, normal work of breathing GI: soft, nontender, nondistended, + BS MS: no deformity or atrophy Skin: warm and dry, no rash Neuro:  Strength and sensation are intact Psych: euthymic mood, full affect   EKG:  EKG is ordered today. The ekg ordered today demonstrates normal sinus rhythm and nonspecific T-wave flattening.  Since previous tracing of 01/05/14, there is no significant change.   Recent Labs: 01/04/2014: BUN 11; Creatinine 0.84; Hemoglobin 12.7; Platelets 285; Potassium 3.4*; Sodium 136*    Lipid Panel    Component Value Date/Time   CHOL  03/21/2010 1128    176        ATP III CLASSIFICATION:  <200     mg/dL   Desirable  161-096200-239  mg/dL   Borderline High  >=045>=240    mg/dL   High          TRIG 80 03/21/2010 1128   HDL 57 03/21/2010 1128   CHOLHDL 3.1 03/21/2010 1128   VLDL 16 03/21/2010 1128   LDLCALC * 03/21/2010 1128    103        Total Cholesterol/HDL:CHD Risk Coronary Heart Disease Risk Table                     Men   Women  1/2 Average Risk   3.4   3.3  Average Risk       5.0   4.4  2 X Average Risk   9.6   7.1  3 X  Average Risk  23.4   11.0        Use the calculated Patient Ratio above and the CHD Risk  Table to determine the patient's CHD Risk.        ATP III CLASSIFICATION (LDL):  <100     mg/dL   Optimal  161-096  mg/dL   Near or Above                    Optimal  130-159  mg/dL   Borderline  045-409  mg/dL   High  >811     mg/dL   Very High      Wt Readings from Last 3 Encounters:  11/15/14 216 lb (97.977 kg)  10/06/13 215 lb (97.523 kg)  08/28/13 215 lb 9.8 oz (97.8 kg)      ASSESSMENT AND PLAN:  1.  Atypical chest pain with normal Myoview stress test 08/29/13.  Ejection fraction 73% 2.  History of rheumatic fever in the first grade.  Echocardiogram shows mild mitral stenosis. 3.  Generalized anxiety disorder 4.  Former smoker, history of COPD 5.  History of acid reflux.   Current medicines are reviewed at length with the patient today.  The patient does not have concerns regarding medicines.  The following changes have been made:  no change    Orders Placed This Encounter  Procedures  . EKG 12-Lead     Disposition:   FU with Dr. Patty Sermons in 1 Year for office visit and EKG.  Continue current medication.  Work harder on weight loss.  We will consider another echocardiogram after her next office visit.   Signed, Cassell Clement, MD  11/15/2014 4:59 PM    [facility] [address] [place], [place], [place]  [address] Phone: [phone]; Fax: [phone]

## 2015-01-10 ENCOUNTER — Ambulatory Visit: Payer: Medicaid Other | Admitting: Podiatry

## 2015-01-12 ENCOUNTER — Ambulatory Visit (INDEPENDENT_AMBULATORY_CARE_PROVIDER_SITE_OTHER): Payer: Medicaid Other

## 2015-01-12 DIAGNOSIS — B351 Tinea unguium: Secondary | ICD-10-CM

## 2015-01-12 DIAGNOSIS — Q828 Other specified congenital malformations of skin: Secondary | ICD-10-CM

## 2015-01-12 DIAGNOSIS — M79672 Pain in left foot: Secondary | ICD-10-CM

## 2015-01-12 DIAGNOSIS — M79673 Pain in unspecified foot: Secondary | ICD-10-CM

## 2015-01-12 DIAGNOSIS — E1140 Type 2 diabetes mellitus with diabetic neuropathy, unspecified: Secondary | ICD-10-CM

## 2015-01-12 DIAGNOSIS — M79671 Pain in right foot: Secondary | ICD-10-CM

## 2015-01-12 DIAGNOSIS — E119 Type 2 diabetes mellitus without complications: Secondary | ICD-10-CM

## 2015-01-12 NOTE — Progress Notes (Signed)
Presents today chief complaint of painful elongated toenails.  Objective: Pulses are palpable bilateral nails are thick, yellow dystrophic onychomycosis and painful palpation.   Assessment: Onychomycosis with pain in limb.  Plan: Treatment of nails in thickness and length as covered service secondary to pain.  

## 2015-04-12 ENCOUNTER — Inpatient Hospital Stay (HOSPITAL_COMMUNITY): Payer: Medicaid Other

## 2015-04-12 ENCOUNTER — Emergency Department (HOSPITAL_COMMUNITY): Payer: Medicaid Other

## 2015-04-12 ENCOUNTER — Inpatient Hospital Stay (HOSPITAL_COMMUNITY)
Admission: EM | Admit: 2015-04-12 | Discharge: 2015-04-18 | DRG: 562 | Disposition: A | Discharge status: Skilled Nursing Facility | Payer: Medicaid Other | Attending: Internal Medicine | Admitting: Internal Medicine

## 2015-04-12 ENCOUNTER — Encounter (HOSPITAL_COMMUNITY): Payer: Self-pay | Admitting: Nurse Practitioner

## 2015-04-12 DIAGNOSIS — E860 Dehydration: Secondary | ICD-10-CM | POA: Diagnosis present

## 2015-04-12 DIAGNOSIS — Z79899 Other long term (current) drug therapy: Secondary | ICD-10-CM

## 2015-04-12 DIAGNOSIS — T465X5A Adverse effect of other antihypertensive drugs, initial encounter: Secondary | ICD-10-CM | POA: Diagnosis present

## 2015-04-12 DIAGNOSIS — Z87891 Personal history of nicotine dependence: Secondary | ICD-10-CM

## 2015-04-12 DIAGNOSIS — N179 Acute kidney failure, unspecified: Secondary | ICD-10-CM | POA: Diagnosis present

## 2015-04-12 DIAGNOSIS — S82002A Unspecified fracture of left patella, initial encounter for closed fracture: Secondary | ICD-10-CM | POA: Diagnosis present

## 2015-04-12 DIAGNOSIS — S42301D Unspecified fracture of shaft of humerus, right arm, subsequent encounter for fracture with routine healing: Secondary | ICD-10-CM | POA: Diagnosis not present

## 2015-04-12 DIAGNOSIS — I252 Old myocardial infarction: Secondary | ICD-10-CM | POA: Diagnosis not present

## 2015-04-12 DIAGNOSIS — I10 Essential (primary) hypertension: Secondary | ICD-10-CM | POA: Diagnosis present

## 2015-04-12 DIAGNOSIS — E1165 Type 2 diabetes mellitus with hyperglycemia: Secondary | ICD-10-CM

## 2015-04-12 DIAGNOSIS — M25562 Pain in left knee: Secondary | ICD-10-CM | POA: Diagnosis present

## 2015-04-12 DIAGNOSIS — I050 Rheumatic mitral stenosis: Secondary | ICD-10-CM | POA: Diagnosis present

## 2015-04-12 DIAGNOSIS — Z01810 Encounter for preprocedural cardiovascular examination: Secondary | ICD-10-CM | POA: Diagnosis not present

## 2015-04-12 DIAGNOSIS — J9621 Acute and chronic respiratory failure with hypoxia: Secondary | ICD-10-CM | POA: Diagnosis not present

## 2015-04-12 DIAGNOSIS — I952 Hypotension due to drugs: Secondary | ICD-10-CM | POA: Diagnosis present

## 2015-04-12 DIAGNOSIS — Y92480 Sidewalk as the place of occurrence of the external cause: Secondary | ICD-10-CM

## 2015-04-12 DIAGNOSIS — E1140 Type 2 diabetes mellitus with diabetic neuropathy, unspecified: Secondary | ICD-10-CM | POA: Diagnosis present

## 2015-04-12 DIAGNOSIS — I2510 Atherosclerotic heart disease of native coronary artery without angina pectoris: Secondary | ICD-10-CM | POA: Diagnosis present

## 2015-04-12 DIAGNOSIS — S42301A Unspecified fracture of shaft of humerus, right arm, initial encounter for closed fracture: Secondary | ICD-10-CM

## 2015-04-12 DIAGNOSIS — E039 Hypothyroidism, unspecified: Secondary | ICD-10-CM | POA: Diagnosis present

## 2015-04-12 DIAGNOSIS — S4291XA Fracture of right shoulder girdle, part unspecified, initial encounter for closed fracture: Secondary | ICD-10-CM | POA: Diagnosis not present

## 2015-04-12 DIAGNOSIS — S42201A Unspecified fracture of upper end of right humerus, initial encounter for closed fracture: Secondary | ICD-10-CM | POA: Diagnosis present

## 2015-04-12 DIAGNOSIS — J439 Emphysema, unspecified: Secondary | ICD-10-CM | POA: Diagnosis not present

## 2015-04-12 DIAGNOSIS — I5031 Acute diastolic (congestive) heart failure: Secondary | ICD-10-CM | POA: Diagnosis not present

## 2015-04-12 DIAGNOSIS — S82002D Unspecified fracture of left patella, subsequent encounter for closed fracture with routine healing: Secondary | ICD-10-CM | POA: Diagnosis not present

## 2015-04-12 DIAGNOSIS — R0600 Dyspnea, unspecified: Secondary | ICD-10-CM | POA: Diagnosis not present

## 2015-04-12 DIAGNOSIS — I342 Nonrheumatic mitral (valve) stenosis: Secondary | ICD-10-CM | POA: Diagnosis not present

## 2015-04-12 DIAGNOSIS — F4001 Agoraphobia with panic disorder: Secondary | ICD-10-CM | POA: Diagnosis present

## 2015-04-12 DIAGNOSIS — S42301K Unspecified fracture of shaft of humerus, right arm, subsequent encounter for fracture with nonunion: Secondary | ICD-10-CM | POA: Diagnosis not present

## 2015-04-12 DIAGNOSIS — G8929 Other chronic pain: Secondary | ICD-10-CM | POA: Diagnosis present

## 2015-04-12 DIAGNOSIS — F339 Major depressive disorder, recurrent, unspecified: Secondary | ICD-10-CM | POA: Diagnosis present

## 2015-04-12 DIAGNOSIS — S4291XD Fracture of right shoulder girdle, part unspecified, subsequent encounter for fracture with routine healing: Secondary | ICD-10-CM | POA: Diagnosis not present

## 2015-04-12 DIAGNOSIS — E780 Pure hypercholesterolemia: Secondary | ICD-10-CM | POA: Diagnosis present

## 2015-04-12 DIAGNOSIS — W01198A Fall on same level from slipping, tripping and stumbling with subsequent striking against other object, initial encounter: Secondary | ICD-10-CM | POA: Diagnosis present

## 2015-04-12 DIAGNOSIS — F411 Generalized anxiety disorder: Secondary | ICD-10-CM | POA: Diagnosis present

## 2015-04-12 DIAGNOSIS — E119 Type 2 diabetes mellitus without complications: Secondary | ICD-10-CM | POA: Diagnosis present

## 2015-04-12 DIAGNOSIS — Z720 Tobacco use: Secondary | ICD-10-CM | POA: Diagnosis present

## 2015-04-12 DIAGNOSIS — Z794 Long term (current) use of insulin: Secondary | ICD-10-CM

## 2015-04-12 DIAGNOSIS — J9622 Acute and chronic respiratory failure with hypercapnia: Secondary | ICD-10-CM | POA: Diagnosis not present

## 2015-04-12 DIAGNOSIS — M549 Dorsalgia, unspecified: Secondary | ICD-10-CM | POA: Diagnosis present

## 2015-04-12 DIAGNOSIS — S42309A Unspecified fracture of shaft of humerus, unspecified arm, initial encounter for closed fracture: Secondary | ICD-10-CM | POA: Diagnosis present

## 2015-04-12 DIAGNOSIS — J449 Chronic obstructive pulmonary disease, unspecified: Secondary | ICD-10-CM | POA: Diagnosis present

## 2015-04-12 DIAGNOSIS — W19XXXA Unspecified fall, initial encounter: Secondary | ICD-10-CM | POA: Insufficient documentation

## 2015-04-12 DIAGNOSIS — J9620 Acute and chronic respiratory failure, unspecified whether with hypoxia or hypercapnia: Secondary | ICD-10-CM

## 2015-04-12 HISTORY — DX: Dependence on supplemental oxygen: Z99.81

## 2015-04-12 HISTORY — DX: Type 2 diabetes mellitus without complications: E11.9

## 2015-04-12 HISTORY — DX: Cardiac murmur, unspecified: R01.1

## 2015-04-12 HISTORY — DX: Pneumonia, unspecified organism: J18.9

## 2015-04-12 HISTORY — DX: Fracture of right shoulder girdle, part unspecified, initial encounter for closed fracture: S42.91XA

## 2015-04-12 HISTORY — DX: Rheumatic mitral stenosis: I05.0

## 2015-04-12 HISTORY — DX: Chronic obstructive pulmonary disease, unspecified: J44.9

## 2015-04-12 HISTORY — DX: Gastro-esophageal reflux disease without esophagitis: K21.9

## 2015-04-12 HISTORY — DX: Hypothyroidism, unspecified: E03.9

## 2015-04-12 HISTORY — DX: Unspecified fracture of left patella, initial encounter for closed fracture: S82.002A

## 2015-04-12 HISTORY — DX: Bipolar disorder, unspecified: F31.9

## 2015-04-12 HISTORY — DX: Thrombocytopenia, unspecified: D69.6

## 2015-04-12 HISTORY — DX: Emphysema, unspecified: J43.9

## 2015-04-12 HISTORY — DX: Personal history of other medical treatment: Z92.89

## 2015-04-12 HISTORY — DX: Rheumatic fever without heart involvement: I00

## 2015-04-12 HISTORY — DX: Unspecified osteoarthritis, unspecified site: M19.90

## 2015-04-12 LAB — CBC WITH DIFFERENTIAL/PLATELET
BASOS ABS: 0 10*3/uL (ref 0.0–0.1)
Basophils Relative: 0 % (ref 0–1)
Eosinophils Absolute: 0 10*3/uL (ref 0.0–0.7)
Eosinophils Relative: 0 % (ref 0–5)
HCT: 41.4 % (ref 36.0–46.0)
Hemoglobin: 13.4 g/dL (ref 12.0–15.0)
LYMPHS PCT: 4 % — AB (ref 12–46)
Lymphs Abs: 0.7 10*3/uL (ref 0.7–4.0)
MCH: 27.2 pg (ref 26.0–34.0)
MCHC: 32.4 g/dL (ref 30.0–36.0)
MCV: 84 fL (ref 78.0–100.0)
MONO ABS: 0.6 10*3/uL (ref 0.1–1.0)
Monocytes Relative: 3 % (ref 3–12)
Neutro Abs: 17.1 10*3/uL — ABNORMAL HIGH (ref 1.7–7.7)
Neutrophils Relative %: 93 % — ABNORMAL HIGH (ref 43–77)
Platelets: 283 10*3/uL (ref 150–400)
RBC: 4.93 MIL/uL (ref 3.87–5.11)
RDW: 16 % — ABNORMAL HIGH (ref 11.5–15.5)
WBC: 18.3 10*3/uL — AB (ref 4.0–10.5)

## 2015-04-12 LAB — GLUCOSE, CAPILLARY
Glucose-Capillary: 326 mg/dL — ABNORMAL HIGH (ref 65–99)
Glucose-Capillary: 350 mg/dL — ABNORMAL HIGH (ref 65–99)

## 2015-04-12 LAB — BASIC METABOLIC PANEL
Anion gap: 12 (ref 5–15)
BUN: 17 mg/dL (ref 6–20)
CALCIUM: 8.9 mg/dL (ref 8.9–10.3)
CO2: 27 mmol/L (ref 22–32)
Chloride: 100 mmol/L — ABNORMAL LOW (ref 101–111)
Creatinine, Ser: 1.1 mg/dL — ABNORMAL HIGH (ref 0.44–1.00)
GFR, EST NON AFRICAN AMERICAN: 55 mL/min — AB (ref >60)
GLUCOSE: 225 mg/dL — AB (ref 65–99)
Potassium: 4.3 mmol/L (ref 3.5–5.1)
Sodium: 139 mmol/L (ref 135–145)

## 2015-04-12 LAB — CBG MONITORING, ED
GLUCOSE-CAPILLARY: 233 mg/dL — AB (ref 65–99)
Glucose-Capillary: 291 mg/dL — ABNORMAL HIGH (ref 65–99)

## 2015-04-12 MED ORDER — ONDANSETRON HCL 4 MG/2ML IJ SOLN
4.0000 mg | Freq: Once | INTRAMUSCULAR | Status: AC
  Administered 2015-04-12: 4 mg via INTRAVENOUS
  Filled 2015-04-12: qty 2

## 2015-04-12 MED ORDER — ARIPIPRAZOLE 5 MG PO TABS
5.0000 mg | ORAL_TABLET | Freq: Every day | ORAL | Status: DC
  Administered 2015-04-13 – 2015-04-18 (×6): 5 mg via ORAL
  Filled 2015-04-12 (×6): qty 1

## 2015-04-12 MED ORDER — BISACODYL 10 MG RE SUPP: 10.0000 mg | Freq: Every day | RECTAL | Status: DC | PRN

## 2015-04-12 MED ORDER — INSULIN GLARGINE 100 UNIT/ML ~~LOC~~ SOLN
40.0000 [IU] | Freq: Every day | SUBCUTANEOUS | Status: DC
  Administered 2015-04-12 – 2015-04-14 (×3): 40 [IU] via SUBCUTANEOUS
  Filled 2015-04-12 (×5): qty 0.4

## 2015-04-12 MED ORDER — BUPROPION HCL ER (XL) 300 MG PO TB24
300.0000 mg | ORAL_TABLET | Freq: Every day | ORAL | Status: DC
  Administered 2015-04-13 – 2015-04-18 (×6): 300 mg via ORAL
  Filled 2015-04-12 (×6): qty 1

## 2015-04-12 MED ORDER — INSULIN ASPART 100 UNIT/ML ~~LOC~~ SOLN
0.0000 [IU] | Freq: Three times a day (TID) | SUBCUTANEOUS | Status: DC
  Administered 2015-04-12: 8 [IU] via SUBCUTANEOUS
  Filled 2015-04-12: qty 1

## 2015-04-12 MED ORDER — ALPRAZOLAM 0.5 MG PO TABS
0.5000 mg | ORAL_TABLET | Freq: Every day | ORAL | Status: DC | PRN
  Administered 2015-04-12 – 2015-04-18 (×4): 0.5 mg via ORAL
  Filled 2015-04-12 (×4): qty 1

## 2015-04-12 MED ORDER — LORAZEPAM 2 MG/ML IJ SOLN: 0.5000 mg | Freq: Four times a day (QID) | INTRAMUSCULAR | Status: DC | PRN

## 2015-04-12 MED ORDER — PERFLUTREN LIPID MICROSPHERE
1.0000 mL | INTRAVENOUS | Status: AC | PRN
  Administered 2015-04-12: 2 mL via INTRAVENOUS
  Filled 2015-04-12: qty 10

## 2015-04-12 MED ORDER — ACETAMINOPHEN 325 MG PO TABS
650.0000 mg | ORAL_TABLET | Freq: Four times a day (QID) | ORAL | Status: DC | PRN
  Administered 2015-04-15: 650 mg via ORAL
  Filled 2015-04-12: qty 2

## 2015-04-12 MED ORDER — FLUOXETINE HCL 20 MG PO CAPS
40.0000 mg | ORAL_CAPSULE | Freq: Every day | ORAL | Status: DC
  Administered 2015-04-13 – 2015-04-18 (×6): 40 mg via ORAL
  Filled 2015-04-12 (×8): qty 2

## 2015-04-12 MED ORDER — FENTANYL CITRATE (PF) 100 MCG/2ML IJ SOLN
50.0000 ug | INTRAMUSCULAR | Status: DC | PRN
  Administered 2015-04-12 (×3): 50 ug via INTRAVENOUS
  Filled 2015-04-12 (×3): qty 2

## 2015-04-12 MED ORDER — ARIPIPRAZOLE 5 MG PO TABS: 5.0000 mg | ORAL_TABLET | Freq: Every day | ORAL | Status: DC

## 2015-04-12 MED ORDER — ONDANSETRON HCL 4 MG/2ML IJ SOLN
4.0000 mg | Freq: Four times a day (QID) | INTRAMUSCULAR | Status: DC | PRN
  Administered 2015-04-12 – 2015-04-16 (×4): 4 mg via INTRAVENOUS
  Filled 2015-04-12 (×3): qty 2

## 2015-04-12 MED ORDER — INSULIN ASPART 100 UNIT/ML ~~LOC~~ SOLN: 0.0000 [IU] | SUBCUTANEOUS | Status: DC

## 2015-04-12 MED ORDER — ACETAMINOPHEN 500 MG PO TABS: 500.0000 mg | ORAL_TABLET | Freq: Four times a day (QID) | ORAL | Status: DC | PRN

## 2015-04-12 MED ORDER — MORPHINE SULFATE 2 MG/ML IJ SOLN
2.0000 mg | INTRAMUSCULAR | Status: DC | PRN
  Administered 2015-04-12 – 2015-04-17 (×25): 2 mg via INTRAVENOUS
  Filled 2015-04-12 (×27): qty 1

## 2015-04-12 MED ORDER — HEPARIN SODIUM (PORCINE) 5000 UNIT/ML IJ SOLN
5000.0000 [IU] | Freq: Three times a day (TID) | INTRAMUSCULAR | Status: DC
  Administered 2015-04-12 – 2015-04-18 (×17): 5000 [IU] via SUBCUTANEOUS
  Filled 2015-04-12 (×23): qty 1

## 2015-04-12 MED ORDER — SODIUM CHLORIDE 0.9 % IJ SOLN
3.0000 mL | Freq: Two times a day (BID) | INTRAMUSCULAR | Status: DC
  Administered 2015-04-12 – 2015-04-17 (×8): 3 mL via INTRAVENOUS

## 2015-04-12 MED ORDER — SODIUM CHLORIDE 0.9 % IV SOLN
INTRAVENOUS | Status: DC
  Administered 2015-04-12 – 2015-04-14 (×3): via INTRAVENOUS

## 2015-04-12 MED ORDER — LEVOTHYROXINE SODIUM 100 MCG IV SOLR
25.0000 ug | Freq: Every day | INTRAVENOUS | Status: DC
  Filled 2015-04-12 (×2): qty 5

## 2015-04-12 MED ORDER — HYDROCODONE-ACETAMINOPHEN 5-325 MG PO TABS
1.0000 | ORAL_TABLET | ORAL | Status: DC | PRN
  Administered 2015-04-13 – 2015-04-18 (×16): 1 via ORAL
  Filled 2015-04-12 (×17): qty 1

## 2015-04-12 MED ORDER — OXYCODONE-ACETAMINOPHEN 5-325 MG PO TABS: 2.0000 | ORAL_TABLET | ORAL | Status: DC | PRN

## 2015-04-12 MED ORDER — SIMVASTATIN 10 MG PO TABS: 10.0000 mg | ORAL_TABLET | Freq: Every day | ORAL | Status: DC

## 2015-04-12 MED ORDER — ALBUTEROL SULFATE HFA 108 (90 BASE) MCG/ACT IN AERS: 1.0000 | INHALATION_SPRAY | Freq: Two times a day (BID) | RESPIRATORY_TRACT | Status: DC | PRN

## 2015-04-12 MED ORDER — GABAPENTIN 300 MG PO CAPS
300.0000 mg | ORAL_CAPSULE | Freq: Three times a day (TID) | ORAL | Status: DC
  Administered 2015-04-12 – 2015-04-18 (×17): 300 mg via ORAL
  Filled 2015-04-12 (×24): qty 1

## 2015-04-12 MED ORDER — FENTANYL CITRATE (PF) 100 MCG/2ML IJ SOLN
100.0000 ug | Freq: Once | INTRAMUSCULAR | Status: AC
  Administered 2015-04-12: 100 ug via INTRAVENOUS
  Filled 2015-04-12: qty 2

## 2015-04-12 MED ORDER — ALBUTEROL SULFATE (2.5 MG/3ML) 0.083% IN NEBU: 2.5000 mg | INHALATION_SOLUTION | Freq: Two times a day (BID) | RESPIRATORY_TRACT | Status: DC | PRN

## 2015-04-12 MED ORDER — SIMVASTATIN 10 MG PO TABS
10.0000 mg | ORAL_TABLET | Freq: Every day | ORAL | Status: DC
  Administered 2015-04-12 – 2015-04-18 (×7): 10 mg via ORAL
  Filled 2015-04-12 (×9): qty 1

## 2015-04-12 MED ORDER — ACETAMINOPHEN 650 MG RE SUPP: 650.0000 mg | Freq: Four times a day (QID) | RECTAL | Status: DC | PRN

## 2015-04-12 MED ORDER — LEVOTHYROXINE SODIUM 50 MCG PO TABS: 50.0000 ug | ORAL_TABLET | Freq: Every day | ORAL | Status: DC

## 2015-04-12 MED ORDER — ONDANSETRON HCL 4 MG PO TABS: 4.0000 mg | ORAL_TABLET | Freq: Four times a day (QID) | ORAL | Status: DC | PRN

## 2015-04-12 MED ORDER — INSULIN ASPART 100 UNIT/ML ~~LOC~~ SOLN
0.0000 [IU] | SUBCUTANEOUS | Status: DC
  Administered 2015-04-12: 15 [IU] via SUBCUTANEOUS
  Administered 2015-04-13: 11 [IU] via SUBCUTANEOUS
  Administered 2015-04-13: 5 [IU] via SUBCUTANEOUS
  Administered 2015-04-13: 3 [IU] via SUBCUTANEOUS
  Administered 2015-04-13: 8 [IU] via SUBCUTANEOUS
  Administered 2015-04-13: 5 [IU] via SUBCUTANEOUS
  Administered 2015-04-14: 3 [IU] via SUBCUTANEOUS
  Administered 2015-04-14: 8 [IU] via SUBCUTANEOUS
  Administered 2015-04-14 (×2): 3 [IU] via SUBCUTANEOUS

## 2015-04-12 MED ORDER — PNEUMOCOCCAL VAC POLYVALENT 25 MCG/0.5ML IJ INJ
0.5000 mL | INJECTION | INTRAMUSCULAR | Status: AC
Start: 2015-04-13 — End: 2015-04-13
  Administered 2015-04-13: 0.5 mL via INTRAMUSCULAR
  Filled 2015-04-12: qty 0.5

## 2015-04-12 NOTE — ED Provider Notes (Addendum)
CSN: [id number]     Arrival date & time 04/12/15  1016 History   First MD Initiated Contact with Patient 04/12/15 1019     Chief Complaint  Patient presents with  . Fall      HPI  Vision presents for evaluation after a fall. States she caught her foot going from a progress to a curb. She fell onto her knees. States her knees initially did not hurt. However during her ER course he became more painful and the worsening pain and swelling of her left knee. Struck her right mid humerus against the edge of a car door. Did not impact the ground with her arm. Complains of pain and deformity of her right arm just distal to the shoulder. Did not strike her head. Did not injure her neck. Did not syncope. This was a trip and fall not related to any additional symptoms.  Past Medical History  Diagnosis Date  . Diabetes mellitus   . Hypertension   . Anemia   . Chronic back pain   . Neuropathy   . Hypercholesterolemia   . Major depressive disorder, recurrent   . Generalized anxiety disorder   . Panic disorder with agoraphobia   . Tobacco abuse   . MI (myocardial infarction) 2007   Past Surgical History  Procedure Laterality Date  . Foot surgery    . Back surgery    . Hernia repair    . Tonsillectomy and adenoidectomy    . Shoulder surgery    . Knee surgery    . Colon surgery    . Breast surgery     Family History  Problem Relation Age of Onset  . Heart disease Mother   . Lung cancer Father     was a former smoker   History  Substance Use Topics  . Smoking status: Former Smoker -- 1.00 packs/day for 20 years    Types: Cigarettes    Quit date: 07/05/2012  . Smokeless tobacco: Never Used  . Alcohol Use: No   OB History    No data available     Review of Systems  Constitutional: Negative for fever, chills, diaphoresis, appetite change and fatigue.  HENT: Negative for mouth sores, sore throat and trouble swallowing.   Eyes: Negative for visual disturbance.  Respiratory:  Negative for cough, chest tightness, shortness of breath and wheezing.   Cardiovascular: Negative for chest pain.  Gastrointestinal: Negative for nausea, vomiting, abdominal pain, diarrhea and abdominal distention.  Endocrine: Negative for polydipsia, polyphagia and polyuria.  Genitourinary: Negative for dysuria, frequency and hematuria.  Musculoskeletal: Negative for gait problem.       Right arm/humerus pain. Left knee pain.  Skin: Negative for color change, pallor and rash.  Neurological: Negative for dizziness, syncope, light-headedness and headaches.  Hematological: Does not bruise/bleed easily.  Psychiatric/Behavioral: Negative for behavioral problems and confusion.      Allergies  Trazodone and nefazodone; Celecoxib; and Ibuprofen  Home Medications   Prior to Admission medications   Medication Sig Start Date End Date Taking? Authorizing Provider  albuterol (PROAIR HFA) 108 (90 BASE) MCG/ACT inhaler Inhale 1 puff into the lungs 2 (two) times daily as needed for wheezing or shortness of breath.   Yes Historical Provider, MD  ALPRAZolam Prudy Feeler) 0.5 MG tablet Take 0.5 mg by mouth daily as needed for anxiety.   Yes Historical Provider, MD  ARIPiprazole (ABILIFY) 5 MG tablet Take 5 mg by mouth daily.   Yes Historical Provider, MD  buPROPion (WELLBUTRIN XL)  300 MG 24 hr tablet Take 300 mg by mouth daily.   Yes Historical Provider, MD  canagliflozin (INVOKANA) 100 MG TABS tablet Take 100 mg by mouth daily.   Yes Historical Provider, MD  cetirizine (ZYRTEC) 10 MG tablet Take 10 mg by mouth daily as needed for allergies.   Yes Historical Provider, MD  FLUoxetine (PROZAC) 40 MG capsule Take 40 mg by mouth daily.   Yes Historical Provider, MD  gabapentin (NEURONTIN) 300 MG capsule Take 300 mg by mouth 3 (three) times daily.   Yes Historical Provider, MD  glimepiride (AMARYL) 4 MG tablet Take 4 mg by mouth daily.   Yes Historical Provider, MD  hydrochlorothiazide (HYDRODIURIL) 25 MG tablet  Take 25 mg by mouth daily.   Yes Historical Provider, MD  HYDROcodone-acetaminophen (NORCO/VICODIN) 5-325 MG per tablet Take 1 tablet by mouth every 4 (four) hours as needed. 01/04/14  Yes Gwyneth SproutWhitney Plunkett, MD  insulin aspart (NOVOLOG) 100 UNIT/ML injection Inject 15 Units into the skin 3 (three) times daily as needed for high blood sugar (CBG >100).    Yes Historical Provider, MD  insulin glargine (LANTUS) 100 UNIT/ML injection Inject 0.4 mLs (40 Units total) into the skin at bedtime. Patient taking differently: Inject 50 Units into the skin at bedtime.  08/30/13  Yes Leroy SeaPrashant K Singh, MD  levothyroxine (SYNTHROID, LEVOTHROID) 50 MCG tablet Take 50 mcg by mouth daily before breakfast.   Yes Historical Provider, MD  losartan (COZAAR) 100 MG tablet Take 100 mg by mouth daily.   Yes Historical Provider, MD  meloxicam (MOBIC) 7.5 MG tablet Take 7.5 mg by mouth 2 (two) times daily.   Yes Historical Provider, MD  methocarbamol (ROBAXIN) 500 MG tablet Take 500 mg by mouth 2 (two) times daily as needed for muscle spasms.   Yes Historical Provider, MD  omeprazole (PRILOSEC) 20 MG capsule Take 20 mg by mouth daily.   Yes Historical Provider, MD  simvastatin (ZOCOR) 10 MG tablet Take 10 mg by mouth daily.   Yes Historical Provider, MD  acetaminophen (TYLENOL) 500 MG tablet Take 1 tablet (500 mg total) by mouth every 6 (six) hours as needed. 09/11/13   Antony MaduraKelly Humes, PA-C  levothyroxine (LEVOTHROID) 25 MCG tablet Take 1 tablet (25 mcg total) by mouth daily before breakfast. Patient not taking: Reported on 04/12/2015 08/30/13   Leroy SeaPrashant K Singh, MD  oxyCODONE-acetaminophen (PERCOCET/ROXICET) 5-325 MG per tablet Take 2 tablets by mouth every 4 (four) hours as needed. 04/12/15   Rolland PorterMark Kurstyn Larios, MD   BP 99/42 mmHg  Pulse 78  Temp(Src) 97.9 F (36.6 C) (Oral)  Resp 12  SpO2 96% Physical Exam  Constitutional: [name] oriented to person, place, and time. She appears well-developed and well-nourished. No distress.  HENT:    Head: Normocephalic.  Eyes: Conjunctivae are normal. Pupils are equal, round, and reactive to light. No scleral icterus.  Neck: Normal range of motion. Neck supple. No thyromegaly present.  Cardiovascular: Normal rate and regular rhythm.  Exam reveals no gallop and no friction rub.   No murmur heard. Pulmonary/Chest: Effort normal and breath sounds normal. No respiratory distress. She has no wheezes. She has no rales.  Abdominal: Soft. Bowel sounds are normal. She exhibits no distension. There is no tenderness. There is no rebound.  Musculoskeletal: Normal range of motion.       Arms:      Legs: Neurological: [name] alert and oriented to person, place, and time.  Skin: Skin is warm and dry. No rash  noted.  Psychiatric: She has a normal mood and affect. Her behavior is normal.    ED Course  Procedures (including critical care time) Labs Review Labs Reviewed  CBC WITH DIFFERENTIAL/PLATELET - Abnormal; Notable for the following:    WBC 18.3 (*)    RDW 16.0 (*)    Neutrophils Relative % 93 (*)    Neutro Abs 17.1 (*)    Lymphocytes Relative 4 (*)    All other components within normal limits  BASIC METABOLIC PANEL - Abnormal; Notable for the following:    Chloride 100 (*)    Glucose, Bld 225 (*)    Creatinine, Ser 1.10 (*)    GFR calc non Af Amer 55 (*)    All other components within normal limits  CBG MONITORING, ED - Abnormal; Notable for the following:    Glucose-Capillary 233 (*)    All other components within normal limits  URINALYSIS, ROUTINE W REFLEX MICROSCOPIC (NOT AT [facility])    Imaging Review Dg Shoulder Right  04/12/2015   CLINICAL DATA:  54 year old female with a history fall.  EXAM: RIGHT SHOULDER - 2+ VIEW  COMPARISON:  01/04/2014  FINDINGS: New acute comminuted fracture of the proximal humerus, with the fracture line involving the humeral head. Trans axillary image demonstrates approximately 27 degrees of angulation at the fracture site.  IMPRESSION: Acute comminuted  fracture of the proximal right humerus with the fracture line extending to involve the humeral head. There is approximately 27 degrees of angulation on the trans axillary image.  Signed,  [name]. [name], DO  [facility]  [facility]   Electronically Signed   By: Gilmer Mor D.O.   On: 04/12/2015 11:07   Dg Knee Complete 4 Views Left  04/12/2015   CLINICAL DATA:  Larey Seat last week landing onto left knee. Patient describes left knee pain medially with swelling and difficulty bearing weight.  EXAM: LEFT KNEE - COMPLETE 4+ VIEW  COMPARISON:  None available.  FINDINGS: Patient is status post total left knee arthroplasty. Hardware appears intact and well positioned. There is a fracture deformity of the superior patella, as seen on the cross-table lateral view, of uncertain age but possibly acute.  IMPRESSION: Slightly displaced/comminuted fracture deformity of the superior left patella. This is of uncertain age but could certainly be acute. Is patient symptomatic in this area of the patellar?  Status post total left knee arthroplasty. Hardware appears intact and well positioned. No acute fracture or osseous dislocation in the regions of the distal left femur or proximal tib-fib.   Electronically Signed   By: Bary Richard M.D.   On: 04/12/2015 14:03     EKG Interpretation None      MDM   Final diagnoses:  Humerus fracture, right, closed, initial encounter  Fall  Patella fracture, left, closed, initial encounter    I discussed the case with Dr. Ave Filter of [facility]. Patient's primary orthopedist is Dr. Renae Fickle who performed her left total knee arthroplasty. Dr. Ave Filter see the patient in consultation. He requested admission by hospitalist with consideration patient's multiple medical problems. Call placed to try hospitalist regarding admission. Patient's primary care physician is Dr.Avbuere.   Rolland Porter, MD 04/12/15 1600  Rolland Porter,  MD 04/12/15 [phone]

## 2015-04-12 NOTE — Progress Notes (Signed)
  Echocardiogram 2D Echocardiogram has been performed.  [name], [name] 04/12/2015, 6:27 PM

## 2015-04-12 NOTE — H&P (Signed)
[facility] History and Physical                                                                                    [name], is a 55 y.o. female  MRN: [id number]   DOB - 16-Jul-1961  Admit Date - 04/12/2015  Outpatient Primary MD for the patient is [name], MD  Referring Physician:  Dr. Fayrene Fearing, EDP  Chief Complaint:   Chief Complaint  Patient presents with  . Fall     HPI  [name]  is a 54 y.o. female, with diabetes mellitus, hypothyroidism, chronic back pain, generalized anxiety disorder and history of an MI. She presented to the emergency room after a mechanical fall today and was found to have a right humerus fracture and a left patella fracture. The patient reports she was feeling well in her usual state of health. She was walking down the sidewalk when her ankle everted over the edge of the sidewalk and she fell. She hit a car door with her shoulder during the fall. She was evaluated in the emergency department and found to have a left patella fracture and a right humerus fracture. Her shoulder was stabilized in a sling. Orthopedic surgery (Dr. Ave Filter) was consulted. The patient reports that she has not fallen frequently. She was not dizzy. This was not a presyncopal.  In the emergency department the patient has a white count of 18, and blood pressure 99/42. Her other comorbidities appear stable. With the exception of her diabetes. CBG is 225.  Review of Systems   In addition to the HPI above,  No Fever-chills, No Headache, No changes with Vision or hearing, No problems swallowing food or Liquids, No Chest pain, Cough or Shortness of Breath, No Abdominal pain, No Nausea or Vomiting, Bowel movements are regular, No Blood in stool or Urine, No dysuria, No new skin rashes or bruises, No new joints pains-aches,  No new weakness, tingling, numbness in any extremity, No recent weight gain or loss, A full 10 point Review of Systems was done, except as stated  above, all other Review of Systems were negative.  Past Medical History  Past Medical History  Diagnosis Date  . Diabetes mellitus   . Hypertension   . Anemia   . Chronic back pain   . Neuropathy   . Hypercholesterolemia   . Major depressive disorder, recurrent   . Generalized anxiety disorder   . Panic disorder with agoraphobia   . Tobacco abuse   . MI (myocardial infarction) 2007    Past Surgical History  Procedure Laterality Date  . Foot surgery    . Back surgery    . Hernia repair    . Tonsillectomy and adenoidectomy    . Shoulder surgery    . Knee surgery    . Colon surgery    . Breast surgery        Social History History  Substance Use Topics  . Smoking status: Former Smoker -- 1.00 packs/day for 20 years    Types: Cigarettes    Quit date: 07/05/2012  . Smokeless tobacco: Never Used  . Alcohol Use: No   lives at home  with her husband. Independent with ADLs but had a home health care aide.  Family History Family History  Problem Relation Age of Onset  . Heart disease Mother   . Lung cancer Father     was a former smoker    Prior to Admission medications   Medication Sig Start Date End Date Taking? Authorizing Provider  albuterol (PROAIR HFA) 108 (90 BASE) MCG/ACT inhaler Inhale 1 puff into the lungs 2 (two) times daily as needed for wheezing or shortness of breath.   Yes Historical Provider, MD  ALPRAZolam Prudy Feeler) 0.5 MG tablet Take 0.5 mg by mouth daily as needed for anxiety.   Yes Historical Provider, MD  ARIPiprazole (ABILIFY) 5 MG tablet Take 5 mg by mouth daily.   Yes Historical Provider, MD  buPROPion (WELLBUTRIN XL) 300 MG 24 hr tablet Take 300 mg by mouth daily.   Yes Historical Provider, MD  canagliflozin (INVOKANA) 100 MG TABS tablet Take 100 mg by mouth daily.   Yes Historical Provider, MD  cetirizine (ZYRTEC) 10 MG tablet Take 10 mg by mouth daily as needed for allergies.   Yes Historical Provider, MD  FLUoxetine (PROZAC) 40 MG capsule Take  40 mg by mouth daily.   Yes Historical Provider, MD  gabapentin (NEURONTIN) 300 MG capsule Take 300 mg by mouth 3 (three) times daily.   Yes Historical Provider, MD  glimepiride (AMARYL) 4 MG tablet Take 4 mg by mouth daily.   Yes Historical Provider, MD  hydrochlorothiazide (HYDRODIURIL) 25 MG tablet Take 25 mg by mouth daily.   Yes Historical Provider, MD  HYDROcodone-acetaminophen (NORCO/VICODIN) 5-325 MG per tablet Take 1 tablet by mouth every 4 (four) hours as needed. 01/04/14  Yes Gwyneth Sprout, MD  insulin aspart (NOVOLOG) 100 UNIT/ML injection Inject 15 Units into the skin 3 (three) times daily as needed for high blood sugar (CBG >100).    Yes Historical Provider, MD  insulin glargine (LANTUS) 100 UNIT/ML injection Inject 0.4 mLs (40 Units total) into the skin at bedtime. Patient taking differently: Inject 50 Units into the skin at bedtime.  08/30/13  Yes Leroy Sea, MD  levothyroxine (SYNTHROID, LEVOTHROID) 50 MCG tablet Take 50 mcg by mouth daily before breakfast.   Yes Historical Provider, MD  losartan (COZAAR) 100 MG tablet Take 100 mg by mouth daily.   Yes Historical Provider, MD  meloxicam (MOBIC) 7.5 MG tablet Take 7.5 mg by mouth 2 (two) times daily.   Yes Historical Provider, MD  methocarbamol (ROBAXIN) 500 MG tablet Take 500 mg by mouth 2 (two) times daily as needed for muscle spasms.   Yes Historical Provider, MD  omeprazole (PRILOSEC) 20 MG capsule Take 20 mg by mouth daily.   Yes Historical Provider, MD  simvastatin (ZOCOR) 10 MG tablet Take 10 mg by mouth daily.   Yes Historical Provider, MD  acetaminophen (TYLENOL) 500 MG tablet Take 1 tablet (500 mg total) by mouth every 6 (six) hours as needed. 09/11/13   Antony Madura, PA-C  levothyroxine (LEVOTHROID) 25 MCG tablet Take 1 tablet (25 mcg total) by mouth daily before breakfast. Patient not taking: Reported on 04/12/2015 08/30/13   Leroy Sea, MD  oxyCODONE-acetaminophen (PERCOCET/ROXICET) 5-325 MG per tablet Take 2  tablets by mouth every 4 (four) hours as needed. 04/12/15   Rolland Porter, MD    Allergies  Allergen Reactions  . Trazodone And Nefazodone Other (See Comments)    Shortness of breath.  . Celecoxib Nausea Only  . Ibuprofen Nausea Only  Physical Exam  Vitals  Blood pressure 130/58, pulse 89, temperature 97.9 F (36.6 C), temperature source Oral, resp. rate 9, SpO2 94 %.   General: Obese female lying in bed mildly anxious, complaining of pain.  Psych:  Normal affect and insight, Not Suicidal or Homicidal, Awake Alert, Oriented X 3.  Neuro:   No F.N deficits  ENT:  Ears and Eyes appear Normal, Conjunctivae clear, PER. Moist oral mucosa without erythema or exudates.  Neck:  Supple, No lymphadenopathy appreciated  Respiratory:  Symmetrical chest wall movement, Good air movement bilaterally, CTAB.  Cardiac:  RRR, No Murmurs, trace bilateral LE edema noted, no JVD.    Abdomen:  Obese, Positive bowel sounds, Soft, Non tender, Non distended,  No masses appreciated  Skin:  No Cyanosis, Normal Skin Turgor, No Skin Rash or Bruise.  Extremities:  Right arm is wrapped in an Ace bandage and placed in a stabilization sling. Left knee is tender to palpation.  Data Review  CBC  Recent Labs Lab 04/12/15 1455  WBC 18.3*  HGB 13.4  HCT 41.4  PLT 283  MCV 84.0  MCH 27.2  MCHC 32.4  RDW 16.0*  LYMPHSABS 0.7  MONOABS 0.6  EOSABS 0.0  BASOSABS 0.0    Chemistries   Recent Labs Lab 04/12/15 1455  NA 139  K 4.3  CL 100*  CO2 27  GLUCOSE 225*  BUN 17  CREATININE 1.10*  CALCIUM 8.9    Urinalysis    Component Value Date/Time   COLORURINE YELLOW 09/11/2013 1756   APPEARANCEUR CLEAR 09/11/2013 1756   LABSPEC 1.021 09/11/2013 1756   PHURINE 7.0 09/11/2013 1756   GLUCOSEU NEGATIVE 09/11/2013 1756   HGBUR NEGATIVE 09/11/2013 1756   BILIRUBINUR NEGATIVE 09/11/2013 1756   KETONESUR NEGATIVE 09/11/2013 1756   PROTEINUR NEGATIVE 09/11/2013 1756   UROBILINOGEN 1.0  09/11/2013 1756   NITRITE NEGATIVE 09/11/2013 1756   LEUKOCYTESUR NEGATIVE 09/11/2013 1756    Imaging results:   Dg Shoulder Right  04/12/2015   CLINICAL DATA:  53 year old female with a history fall.  EXAM: RIGHT SHOULDER - 2+ VIEW  COMPARISON:  01/04/2014  FINDINGS: New acute comminuted fracture of the proximal humerus, with the fracture line involving the humeral head. Trans axillary image demonstrates approximately 27 degrees of angulation at the fracture site.  IMPRESSION: Acute comminuted fracture of the proximal right humerus with the fracture line extending to involve the humeral head. There is approximately 27 degrees of angulation on the trans axillary image.  Signed,  [name]. [name], DO  [facility]  [facility]   Electronically Signed   By: Gilmer Mor D.O.   On: 04/12/2015 11:07   Dg Knee Complete 4 Views Left  04/12/2015   CLINICAL DATA:  Larey Seat last week landing onto left knee. Patient describes left knee pain medially with swelling and difficulty bearing weight.  EXAM: LEFT KNEE - COMPLETE 4+ VIEW  COMPARISON:  None available.  FINDINGS: Patient is status post total left knee arthroplasty. Hardware appears intact and well positioned. There is a fracture deformity of the superior patella, as seen on the cross-table lateral view, of uncertain age but possibly acute.  IMPRESSION: Slightly displaced/comminuted fracture deformity of the superior left patella. This is of uncertain age but could certainly be acute. Is patient symptomatic in this area of the patellar?  Status post total left knee arthroplasty. Hardware appears intact and well positioned. No acute fracture or osseous dislocation in the regions of the distal left femur  or proximal tib-fib.   Electronically Signed   By: Bary RichardStan  Maynard M.D.   On: 04/12/2015 14:03    My personal review of EKG: NSR, No ST changes noted. No QT prolongation.   Assessment & Plan  Principal Problem:    Shoulder fracture, right Active Problems:   HTN (hypertension)   COPD (chronic obstructive pulmonary disease)   Tobacco abuse   Diabetes type 2, controlled   Mitral stenosis   Left patella fracture   Generalized anxiety disorder   Humerus fracture    Right shoulder fracture and left patella fracture Secondary to a mechanical fall. Orthopedic surgery to see and evaluate for potential shoulder repair.  Nothing by mouth until seen by surgery.  Will order preop 2-D echo. Stabilization with a sling has been applied.  Physical therapy evaluation 7/21 or postop. Carefully medicate for pain (watching blood pressure)  Hypertension Patient is currently hypotensive likely secondary to pain medications. Will hold all antihypertensives medications.  Please restart blood pressure medications when patient is able to have diet. She has had her metoprolol on 7/20.  Type 2 diabetes Hold Invokana.  Continue NovoLog and Lantus. Check hemoglobin A1c.  History of coronary artery disease, mitral stenosis, MI We will check a 2-D echo preop. Recommend she be started on a 81 mg aspirin daily postop.  COPD with ongoing tobacco abuse Nicotine patch ordered. Patient is stable and not wheezing. Continue albuterol inhaler when necessary.  General anxiety disorder with panic disorder and Agoraphobia Hold Xanax, Prozac, Wellbutrin, Abilify Will give Ativan when necessary IV. Please restart oral psychiatric medications when patient is able to have diet.  Consultants Called:  Orthopedic surgery, Dr. Ave Filterhandler. Her primary orthopedic surgeon is Dr. Renae FicklePaul.  Family Communication:   Patient is alert oriented and understands her plan of care.  Code Status:  Full code  Condition:  Stable  Potential Disposition: 48-72 hours depending on whether or not she has surgery and her pain can be controlled.  Time spent in minutes : [address]   Omid Deardorff York,  PA-C on 04/12/2015 at 5:37 PM Between 7am to 7pm - Pager -  [phone] After 7pm go to [email or web address] - password [email or web address] And look for the night coverage person covering me after hours  [facility]

## 2015-04-12 NOTE — ED Notes (Signed)
[id number] 

## 2015-04-12 NOTE — ED Notes (Signed)
Dr. Ave Filterhandler to bedside reports no surgery today pt can eat until midnight tonight.

## 2015-04-12 NOTE — ED Notes (Signed)
In to discharge patient, pt began crying c/o of left knee pain. Dr. Fayrene FearingJames to bedside to assess.Pt was going to be provided a cab voucher.

## 2015-04-12 NOTE — ED Notes (Signed)
Echo at bed side with patient.

## 2015-04-12 NOTE — ED Notes (Addendum)
Per ems pt from home tripped on pavement on to grass and fell on right shoulder. Pt denies LOC. Right shoulder positive for crepitus and 9/10. Pt has small abrasion to left knee. Pt has chronic neck and back pain and was on her way to the pain clinic. Pt C-spine stabilized with towel due to pt body habitus.   PT has had 100 mcg of fentanyl.

## 2015-04-12 NOTE — Discharge Instructions (Signed)
Humerus Fracture, Treated with Immobilization °The humerus is the large bone in the upper arm. A broken (fractured) humerus is often treated by wearing a cast, splint, or sling (immobilization). This holds the broken pieces in place so they can heal.  °HOME CARE °· Put ice on the injured area. °¨ Put ice in a plastic bag. °¨ Place a towel between your skin and the bag. °¨ Leave the ice on for 15-20 minutes, 03-04 times a day. °· If you are given a cast: °¨ Do not scratch the skin under the cast. °¨ Check the skin around the cast every day. You may put lotion on any red or sore areas. °¨ Keep the cast dry and clean. °· If you are given a splint: °¨ Wear the splint as told. °¨ Keep the splint clean and dry. °¨ Loosen the elastic around the splint if your fingers become numb, cold, tingle, or turn blue. °· If you are given a sling: °¨ Wear the sling as told. °· Do not put pressure on any part of the cast or splint until it is fully hardened. °· The cast or splint must be protected with a plastic bag during bathing. Do not lower the cast or splint into water. °· Only take medicine as told by your doctor. °· Do exercises as told by your doctor. °· Follow up as told by your doctor. °GET HELP RIGHT AWAY IF:  °· Your skin or fingernails turn blue or gray. °· Your arm feels cold or numb. °· You have very bad pain in the injured arm. °· You are having problems with the medicines you were given. °MAKE SURE YOU:  °· Understand these instructions. °· Will watch your condition. °· Will get help right away if you are not doing well or get worse. °Document Released: 02/26/2008 Document Revised: 12/02/2011 Document Reviewed: 10/24/2010 °[employer]® Patient Information ©2015 [employer]. This information is not intended to replace advice given to you by your health care provider. Make sure you discuss any questions you have with your health care provider. ° °

## 2015-04-12 NOTE — Progress Notes (Signed)
Orthopedic Tech Progress Note Patient Details:  Elizabeth BuryMary J [name] 1961/01/19 [id number]  Ortho Devices Type of Ortho Device: Sling immobilizer, Long arm splint, Stirrup splint Ortho Device/Splint Location: rue Ortho Device/Splint Interventions: Application   [name] 04/12/2015, 12:20 PM

## 2015-04-12 NOTE — Consult Note (Signed)
Reason for Consult:Eval R humerus and L patellafracture Referring Physician: [name] is an 54 y.o. female.  HPI: S/p fall today with c/o R arm and L knee pain.  C/o pain R arm shoulder to elbow and L anterior knee pain.  Worse with movement, better with rest, immobilization and pain meds.  Denies other musculoskeletal injuries.  Past Medical History  Diagnosis Date  . Diabetes mellitus   . Hypertension   . Anemia   . Chronic back pain   . Neuropathy   . Hypercholesterolemia   . Major depressive disorder, recurrent   . Generalized anxiety disorder   . Panic disorder with agoraphobia   . Tobacco abuse   . MI (myocardial infarction) 2007    Past Surgical History  Procedure Laterality Date  . Foot surgery    . Back surgery    . Hernia repair    . Tonsillectomy and adenoidectomy    . Shoulder surgery    . Knee surgery    . Colon surgery    . Breast surgery      Family History  Problem Relation Age of Onset  . Heart disease Mother   . Lung cancer Father     was a former smoker    Social History:  reports that she quit smoking about 2 years ago. Her smoking use included Cigarettes. She has a 20 pack-year smoking history. She has never used smokeless tobacco. She reports that she does not drink alcohol or use illicit drugs.  Allergies:  Allergies  Allergen Reactions  . Trazodone And Nefazodone Other (See Comments)    Shortness of breath.  . Celecoxib Nausea Only  . Ibuprofen Nausea Only    Medications: I have reviewed the patient's current medications.  Results for orders placed or performed during the hospital encounter of 04/12/15 (from the past 48 hour(s))  CBC with Differential/Platelet     Status: Abnormal   Collection Time: 04/12/15  2:55 PM  Result Value Ref Range   WBC 18.3 (H) 4.0 - 10.5 K/uL   RBC 4.93 3.87 - 5.11 MIL/uL   Hemoglobin 13.4 12.0 - 15.0 g/dL   HCT 41.4 36.0 - 46.0 %   MCV 84.0 78.0 - 100.0 fL   MCH 27.2 26.0 - 34.0 pg   MCHC  32.4 30.0 - 36.0 g/dL   RDW 16.0 (H) 11.5 - 15.5 %   Platelets 283 150 - 400 K/uL   Neutrophils Relative % 93 (H) 43 - 77 %   Neutro Abs 17.1 (H) 1.7 - 7.7 K/uL   Lymphocytes Relative 4 (L) 12 - 46 %   Lymphs Abs 0.7 0.7 - 4.0 K/uL   Monocytes Relative 3 3 - 12 %   Monocytes Absolute 0.6 0.1 - 1.0 K/uL   Eosinophils Relative 0 0 - 5 %   Eosinophils Absolute 0.0 0.0 - 0.7 K/uL   Basophils Relative 0 0 - 1 %   Basophils Absolute 0.0 0.0 - 0.1 K/uL  Basic metabolic panel     Status: Abnormal   Collection Time: 04/12/15  2:55 PM  Result Value Ref Range   Sodium 139 135 - 145 mmol/L   Potassium 4.3 3.5 - 5.1 mmol/L   Chloride 100 (L) 101 - 111 mmol/L   CO2 27 22 - 32 mmol/L   Glucose, Bld 225 (H) 65 - 99 mg/dL   BUN 17 6 - 20 mg/dL   Creatinine, Ser 1.10 (H) 0.44 - 1.00 mg/dL   Calcium  8.9 8.9 - 10.3 mg/dL   GFR calc non Af Amer 55 (L) >60 mL/min   GFR calc Af Amer >60 >60 mL/min    Comment: (NOTE) The eGFR has been calculated using the CKD EPI equation. This calculation has not been validated in all clinical situations. eGFR's persistently <60 mL/min signify possible Chronic Kidney Disease.    Anion gap 12 5 - 15  CBG monitoring, ED     Status: Abnormal   Collection Time: 04/12/15  3:32 PM  Result Value Ref Range   Glucose-Capillary 233 (H) 65 - 99 mg/dL    Dg Shoulder Right  04/12/2015   CLINICAL DATA:  54 year old female with a history fall.  EXAM: RIGHT SHOULDER - 2+ VIEW  COMPARISON:  01/04/2014  FINDINGS: New acute comminuted fracture of the proximal humerus, with the fracture line involving the humeral head. Trans axillary image demonstrates approximately 27 degrees of angulation at the fracture site.  IMPRESSION: Acute comminuted fracture of the proximal right humerus with the fracture line extending to involve the humeral head. There is approximately 27 degrees of angulation on the trans axillary image.  Signed,  Elizabeth Fanny. Earleen Newport, DO  [facility]  [facility]   Electronically Signed   By: [name] D.O.   On: 04/12/2015 11:07   Dg Knee Complete 4 Views Left  04/12/2015   CLINICAL DATA:  [place] last week landing onto left knee. Patient describes left knee pain medially with swelling and difficulty bearing weight.  EXAM: LEFT KNEE - COMPLETE 4+ VIEW  COMPARISON:  None available.  FINDINGS: Patient is status post total left knee arthroplasty. Hardware appears intact and well positioned. There is a fracture deformity of the superior patella, as seen on the cross-table lateral view, of uncertain age but possibly acute.  IMPRESSION: Slightly displaced/comminuted fracture deformity of the superior left patella. This is of uncertain age but could certainly be acute. Is patient symptomatic in this area of the patellar?  Status post total left knee arthroplasty. Hardware appears intact and well positioned. No acute fracture or osseous dislocation in the regions of the distal left femur or proximal tib-fib.   Electronically Signed   By: [name] M.D.   On: 04/12/2015 14:03    Review of Systems  All other systems reviewed and are negative.  Blood pressure 130/58, pulse 89, temperature 97.9 F (36.6 C), temperature source Oral, resp. rate 9, SpO2 94 %. Physical Exam  Constitutional: [name] oriented to person, place, and time. She appears well-developed and well-nourished.  HENT:  Head: Atraumatic.  Eyes: EOM are normal.  Cardiovascular: Intact distal pulses.   Respiratory: Effort normal.  Musculoskeletal:  RUE splinted and in sling, skin intact, distally intact STLT M/U/R. Intact EPL/HI/Grip.  CR<2 sec  LLE knee small anterior abrasion with TTP over anterior patella.  Able to perform straight leg raise against gravity.  Neurological: [name] alert and oriented to person, place, and time.  Skin: Skin is warm and dry.    Assessment/Plan: #1 Right comminuted promimal humerus fracture: Grossly well aligned.  I think apex  anterior angulation will correct in splint/sling with gravity.  Likely to do as well with nonop management as with surgery.  Rec continue splint /sling x 7-10 days and reassess with XRs in my office. #2 Left non-displaced patella fracture.  Able to perform SLR, retinaculum intact.  Should heal well in knee immobilizer.  Ok for WBAT in knee immobilizer. Dispo: Rec PT/OT for mobilization/ADLS.  May need wheelchair for a while. Will follow along.  Will need f/u in my office in 7-10 days.  Nita Sells 04/12/2015, 6:27 PM

## 2015-04-12 NOTE — ED Notes (Signed)
Spoke with ortho tech. Stated he would be down shortly.

## 2015-04-12 NOTE — Progress Notes (Signed)
[name] in ED for report.

## 2015-04-13 ENCOUNTER — Ambulatory Visit: Payer: Medicaid Other | Admitting: Podiatry

## 2015-04-13 DIAGNOSIS — N179 Acute kidney failure, unspecified: Secondary | ICD-10-CM

## 2015-04-13 DIAGNOSIS — S82002D Unspecified fracture of left patella, subsequent encounter for closed fracture with routine healing: Secondary | ICD-10-CM

## 2015-04-13 DIAGNOSIS — S42301A Unspecified fracture of shaft of humerus, right arm, initial encounter for closed fracture: Secondary | ICD-10-CM

## 2015-04-13 DIAGNOSIS — S42301D Unspecified fracture of shaft of humerus, right arm, subsequent encounter for fracture with routine healing: Secondary | ICD-10-CM

## 2015-04-13 DIAGNOSIS — F411 Generalized anxiety disorder: Secondary | ICD-10-CM

## 2015-04-13 DIAGNOSIS — Z720 Tobacco use: Secondary | ICD-10-CM

## 2015-04-13 HISTORY — DX: Unspecified fracture of shaft of humerus, right arm, initial encounter for closed fracture: S42.301A

## 2015-04-13 LAB — GLUCOSE, CAPILLARY
GLUCOSE-CAPILLARY: 107 mg/dL — AB (ref 65–99)
GLUCOSE-CAPILLARY: 166 mg/dL — AB (ref 65–99)
GLUCOSE-CAPILLARY: 249 mg/dL — AB (ref 65–99)
Glucose-Capillary: 208 mg/dL — ABNORMAL HIGH (ref 65–99)
Glucose-Capillary: 276 mg/dL — ABNORMAL HIGH (ref 65–99)
Glucose-Capillary: 340 mg/dL — ABNORMAL HIGH (ref 65–99)

## 2015-04-13 LAB — CBC
HEMATOCRIT: 37.1 % (ref 36.0–46.0)
Hemoglobin: 12.1 g/dL (ref 12.0–15.0)
MCH: 27.3 pg (ref 26.0–34.0)
MCHC: 32.6 g/dL (ref 30.0–36.0)
MCV: 83.6 fL (ref 78.0–100.0)
Platelets: 270 10*3/uL (ref 150–400)
RBC: 4.44 MIL/uL (ref 3.87–5.11)
RDW: 16.2 % — ABNORMAL HIGH (ref 11.5–15.5)
WBC: 13.7 10*3/uL — ABNORMAL HIGH (ref 4.0–10.5)

## 2015-04-13 LAB — BASIC METABOLIC PANEL
Anion gap: 9 (ref 5–15)
BUN: 26 mg/dL — ABNORMAL HIGH (ref 6–20)
CALCIUM: 8.7 mg/dL — AB (ref 8.9–10.3)
CO2: 30 mmol/L (ref 22–32)
Chloride: 98 mmol/L — ABNORMAL LOW (ref 101–111)
Creatinine, Ser: 1.77 mg/dL — ABNORMAL HIGH (ref 0.44–1.00)
GFR calc Af Amer: 36 mL/min — ABNORMAL LOW (ref >60)
GFR, EST NON AFRICAN AMERICAN: 31 mL/min — AB (ref >60)
GLUCOSE: 112 mg/dL — AB (ref 65–99)
POTASSIUM: 4.6 mmol/L (ref 3.5–5.1)
Sodium: 137 mmol/L (ref 135–145)

## 2015-04-13 MED ORDER — PROMETHAZINE HCL 25 MG/ML IJ SOLN
12.5000 mg | Freq: Once | INTRAMUSCULAR | Status: AC
  Administered 2015-04-13: 12.5 mg via INTRAVENOUS
  Filled 2015-04-13: qty 1

## 2015-04-13 MED ORDER — LEVOTHYROXINE SODIUM 50 MCG PO TABS
50.0000 ug | ORAL_TABLET | Freq: Every day | ORAL | Status: DC
  Administered 2015-04-13 – 2015-04-18 (×6): 50 ug via ORAL
  Filled 2015-04-13 (×8): qty 1

## 2015-04-13 NOTE — Evaluation (Signed)
Physical Therapy Evaluation Patient Details Name: [name] MRN: [id number] DOB: January 09, 1961 Today's Date: 04/13/2015   History of Present Illness  54 y.o. female admitted to [facility] after a fall with R humerus fx and left patella fx both being treated conservatively at this time (no surgery).  Pt with significant PMHx of HTN, anemia, chronic back pain, neuropathy, depression, anxiety, panic disorder, MI, DM II, home O2 2 L PRN, bipolar d/o, bil foot surgery, back surgery, L shoulder surgery, L Patella fx surgery 1999, and ORIF L ankle fx.    Clinical Impression  Pt with significant anxiety over attempts at gait with hand held assist and with cane.  She was unable to step away from the chair and kept posteriorly losing her balance.  [name] not safe to return home at her current level and is a high fall risk.  PT recommending SNF placement for rehab, however, pt's preference is to go home with family's assist.      Follow Up Recommendations SNF;Supervision/Assistance - 24 hour    Equipment Recommendations  3in1 (PT)    Recommendations for Other Services   NA    Precautions / Restrictions Precautions Precautions: Fall Restrictions LLE Weight Bearing: Weight bearing as tolerated (in KI) Other Position/Activity Restrictions: no flexion of left knee      Mobility               Transfers Overall transfer level: Needs assistance Equipment used: 1 person hand held assist;Straight cane Transfers: Sit to/from Stand Sit to Stand: Mod assist;+2 safety/equipment         General transfer comment: two person mod assist for safety to stand from lower recliner chair.  Pt with significant flexed trunk, posterior lean and wide BOS.  She expressed anxiety over trying to walk due to fear of falling.   Ambulation/Gait             General Gait Details: Unable to walk even with two person assist due to imbalance and pt anxiety over walking.  Cane left in room for further attempts.           Balance Overall balance assessment: Needs assistance Sitting-balance support: Feet supported;Single extremity supported Sitting balance-Leahy Scale: Good   Postural control: Posterior lean Standing balance support: Single extremity supported Standing balance-Leahy Scale: Poor                               Pertinent Vitals/Pain Pain Assessment: 0-10 Pain Score: 4  Pain Location: right arm Pain Descriptors / Indicators: Aching Pain Intervention(s): Limited activity within patient's tolerance;Monitored during session;Premedicated before session;Repositioned    Home Living Family/patient expects to be discharged to:: Private residence Living Arrangements: Spouse/significant other;Children Available Help at Discharge: Family;Available 24 hours/day (husband and daughter) Type of Home: House Home Access: Level entry     Home Layout: One level Home Equipment: Walker - 2 wheels;Cane - single point;Shower seat;Grab bars - tub/shower;Hand held shower head;Wheelchair - manual      Prior Function Level of Independence: Independent               Hand Dominance   Dominant Hand: Right (writes with her left hand)    Extremity/Trunk Assessment   Upper Extremity Assessment: RUE deficits/detail RUE Deficits / Details: right arm in sling with non displaced fx.  No orders or indications for WB status, so kept pt non-WB         Lower Extremity  Assessment: LLE deficits/detail   LLE Deficits / Details: left leg in knee immobilizer, reviewed with pt not to take it off and not to bend it.  Pt able to WB through it to stand, and move left ankle 3/5, unable to lift leg against gravity with KI donned (2/5 knee and hip flexion strength).   Cervical / Trunk Assessment: Other exceptions  Communication      Cognition Arousal/Alertness: Awake/alert Behavior During Therapy: Anxious Overall Cognitive Status: Within Functional Limits for tasks assessed                          Exercises General Exercises - Lower Extremity Ankle Circles/Pumps: AROM;Left;10 reps;Supine Hip ABduction/ADduction: AROM;Left;5 reps Straight Leg Raises: AAROM;Left;5 reps      Assessment/Plan    PT Assessment Patient needs continued PT services  PT Diagnosis Difficulty walking;Abnormality of gait;Generalized weakness;Acute pain   PT Problem List Decreased strength;Decreased activity tolerance;Decreased balance;Decreased mobility;Decreased knowledge of use of DME;Decreased knowledge of precautions;Pain;Obesity  PT Treatment Interventions DME instruction;Gait training;Functional mobility training;Therapeutic activities;Therapeutic exercise;Balance training;Neuromuscular re-education;Patient/family education;Wheelchair mobility training;Modalities   PT Goals (Current goals can be found in the Care Plan section) Acute Rehab PT Goals Patient Stated Goal: to go home tomorrow PT Goal Formulation: With patient Time For Goal Achievement: 04/27/15 Potential to Achieve Goals: Good    Frequency Min 5X/week   Barriers to discharge Inaccessible home environment;Decreased caregiver support per pt the WC cannot fit everywhere in her home, her husband is disabled, but her daughter can help (who is also disabled "mentally" per pt).        End of Session Equipment Utilized During Treatment: Other (comment) (right arm sling) Activity Tolerance: Patient limited by pain;Patient limited by fatigue;Other (comment) (limited by fear) Patient left: in chair;with call bell/phone within reach Nurse Communication: Mobility status         Time: 1610-9604 PT Time Calculation (min) (ACUTE ONLY): 25 min   Charges:   PT Evaluation $Initial PT Evaluation Tier I: 1 Procedure PT Treatments $Therapeutic Activity: 8-22 mins        Glenetta Kiger B. Tom Macpherson, PT, DPT [phone]   04/13/2015, 12:22 PM

## 2015-04-13 NOTE — Progress Notes (Signed)
Utilization Review completed. Amier Hoyt RN BSN CM 

## 2015-04-13 NOTE — Evaluation (Signed)
Occupational Therapy Evaluation Patient Details Name: [name] MRN: [id number] DOB: Oct 24, 1960 Today's Date: 04/13/2015    History of Present Illness 54 y.o. female admitted to [facility] after a fall with R humerus fx and left patella fx both being treated conservatively at this time (no surgery).  Pt with significant PMHx of HTN, anemia, chronic back pain, neuropathy, depression, anxiety, panic disorder, MI, DM II, home O2 2 L PRN, bipolar d/o, bil foot surgery, back surgery, L shoulder surgery, L Patella fx surgery 1999, and ORIF L ankle fx.     Clinical Impression   Pt was performing ADL and light meal prep at a modified independent level prior to admission.  Present with anxiety, R shoulder pain, impaired standing balance and dependence in bathing, dressing and toileting.  Pt needed 2 person assist for stand-pivot transfer this visit.  Pt will need SNF upon discharge.  Will follow acutely.    Follow Up Recommendations  SNF    Equipment Recommendations  3 in 1 bedside comode    Recommendations for Other Services       Precautions / Restrictions Precautions Precautions: Fall Restrictions Weight Bearing Restrictions: Yes LLE Weight Bearing: Weight bearing as tolerated (in KI) Other Position/Activity Restrictions: no flexion of left knee      Mobility Bed Mobility Overal bed mobility: Needs Assistance Bed Mobility: Sit to Supine       Sit to supine: Min assist   General bed mobility comments: assisted for LEs  Transfers Overall transfer level: Needs assistance Equipment used: 1 person hand held assist;Straight cane Transfers: Sit to/from Stand;Stand Pivot Transfers Sit to Stand: +2 physical assistance;Min assist Stand pivot transfers: +2 physical assistance;Min assist       General transfer comment: assist to rise and for balance     Balance Overall balance assessment: Needs assistance Sitting-balance support: Feet supported;Single extremity supported Sitting  balance-Leahy Scale: Good   Postural control: Posterior lean Standing balance support: Single extremity supported Standing balance-Leahy Scale: Poor                              ADL Overall ADL's : Needs assistance/impaired Eating/Feeding: Modified independent;Sitting Eating/Feeding Details (indicate cue type and reason): opens packages with mouth Grooming: Wash/dry hands;Wash/dry face;Set up;Sitting   Upper Body Bathing: Moderate assistance;Sitting   Lower Body Bathing: Total assistance;Sit to/from stand   Upper Body Dressing : Moderate assistance;Sitting   Lower Body Dressing: Total assistance;Sit to/from stand   Toilet Transfer: +2 for physical assistance;Minimal assistance;Stand-pivot;BSC   Toileting- Clothing Manipulation and Hygiene: Total assistance;Sit to/from stand               Vision     Perception     Praxis      Pertinent Vitals/Pain Pain Assessment: Faces Pain Score: 4  Faces Pain Scale: Hurts a little bit Pain Location: R arm Pain Descriptors / Indicators: Aching Pain Intervention(s): Monitored during session;Premedicated before session;Repositioned     Hand Dominance Right (lead extremity is task specifically)   Extremity/Trunk Assessment Upper Extremity Assessment Upper Extremity Assessment: RUE deficits/detail RUE Deficits / Details: right arm in sling with non displaced fx.  No orders or indications for WB status, so kept pt non-WB RUE Coordination: decreased gross motor   Lower Extremity Assessment Lower Extremity Assessment: Defer to PT evaluation LLE Deficits / Details: left leg in knee immobilizer, reviewed with pt not to take it off and not to bend it.  Pt able  to WB through it to stand, and move left ankle 3/5, unable to lift leg against gravity with KI donned (2/5 knee and hip flexion strength).  LLE: Unable to fully assess due to immobilization   Cervical / Trunk Assessment Cervical / Trunk Assessment: Other  exceptions Cervical / Trunk Exceptions: h/o low back pain/surgery.  Per pt, she cannot stand long due to back pain.    Communication Communication Communication: No difficulties   Cognition Arousal/Alertness: Awake/alert Behavior During Therapy: Anxious Overall Cognitive Status: Within Functional Limits for tasks assessed                     General Comments       Exercises Exercises: General Lower Extremity     Shoulder Instructions      Home Living Family/patient expects to be discharged to:: Private residence Living Arrangements: Spouse/significant other;Children Available Help at Discharge: Family;Available 24 hours/day Type of Home: House Home Access: Level entry     Home Layout: One level     Bathroom Shower/Tub: Chief Strategy Officer: Standard     Home Equipment: Environmental consultant - 2 wheels;Cane - single point;Shower seat;Grab bars - tub/shower;Hand held shower head;Wheelchair - manual;Tub bench          Prior Functioning/Environment Level of Independence: Independent with assistive device(s)        Comments: used tub transfer bench for showering    OT Diagnosis: Generalized weakness;Acute pain   OT Problem List: Decreased strength;Decreased activity tolerance;Impaired balance (sitting and/or standing);Decreased knowledge of use of DME or AE;Obesity;Pain;Impaired UE functional use   OT Treatment/Interventions: Self-care/ADL training;Therapeutic activities;Therapeutic exercise;Patient/family education;Balance training;DME and/or AE instruction    OT Goals(Current goals can be found in the care plan section) Acute Rehab OT Goals Patient Stated Goal: pt is agreeable to SNF, states her husband cusses too much OT Goal Formulation: With patient Time For Goal Achievement: 04/27/15 Potential to Achieve Goals: Good ADL Goals Pt Will Perform Grooming: with min assist;standing Pt Will Perform Upper Body Bathing: with min assist;sitting Pt Will  Perform Lower Body Bathing: with min assist;sit to/from stand Pt Will Perform Upper Body Dressing: with min assist;sitting Pt Will Perform Lower Body Dressing: with min assist;sit to/from stand Pt Will Transfer to Toilet: with min assist;ambulating;bedside commode Pt Will Perform Toileting - Clothing Manipulation and hygiene: with min assist;sit to/from stand Pt/caregiver will Perform Home Exercise Program: Right Upper extremity;With Supervision (elbow to hand)  OT Frequency: Min 2X/week   Barriers to D/C:            Co-evaluation              End of Session Nurse Communication: Mobility status  Activity Tolerance: Patient tolerated treatment well Patient left: in bed;with call bell/phone within reach   Time: 1335-1409 OT Time Calculation (min): 34 min Charges:  OT General Charges $OT Visit: 1 Procedure OT Evaluation $Initial OT Evaluation Tier I: 1 Procedure OT Treatments $Self Care/Home Management : 8-22 mins G-Codes:    Evern Bio 04/13/2015, 2:20 PM  [phone]

## 2015-04-13 NOTE — Progress Notes (Signed)
PROGRESS NOTE  Elizabeth GREENBERGER ZOX:[id number] DOB: 1960-11-26 DOA: 04/12/2015 PCP: [name], MD  Brief history 54 year old female with a history of diabetes mellitus, coronary artery disease, hyperlipidemia, hypothyroidism, and anxiety presented to emergency department after mechanical fall and she tripped on the edge of the sidewalk. Radiographs in the emergency department revealed a right humerus fracture and left patella fracture. Orthopedics was consulted. They did not feel the patient required surgical intervention. The physical therapy and occupation therapy were ordered. Patient denied any presyncopal symptoms, chest discomfort, shortness breath. Assessment/Plan: Acute kidney injury -Defined depletion as the patient had an episode of emesis. -IV fluids Right comminuted proximal humerus fracture -Case was discussed with orthopedics, Dr. Ave Filter -minimize activity with R-arm, but pt is able to use hand as tolerated -PT/OT -remain in splint/sling Left nondisplaced patellar fracture -Weightbearing as tolerated--remain in the immobilizer -PT/OT -Pain control Diabetes mellitus type 2 -Discontinue Invokana, Amaryl for now -continue lower dose Lantus -novolog SSI -HbA1C Hypertension -The patient had relative hypotension in the emergency department after intravenous fluids -As result, anti-hypertensive agents were held -Blood pressure remained stable off of antihypertensives meds -continue to monitor COPD with ongoing tobacco abuse -Nicotine patch ordered. Patient is stable and not wheezing. Continue albuterol inhaler when necessary. History of coronary artery disease, mitral stenosis, MI -Echo--EF 65-70 percent, grade 1 diastolic dysfunction, no WMA -ASA 81 mg daily General anxiety disorder with panic disorder and Agoraphobia -Restart Xanax, Prozac, Wellbutrin, Abilify -Ativan when necessary IV.   Family Communication:   Pt at beside Disposition Plan:   Home  04/14/15 if renal function improves     Procedures/Studies: Dg Shoulder Right  04/12/2015   CLINICAL DATA:  54 year old female with a history fall.  EXAM: RIGHT SHOULDER - 2+ VIEW  COMPARISON:  01/04/2014  FINDINGS: New acute comminuted fracture of the proximal humerus, with the fracture line involving the humeral head. Trans axillary image demonstrates approximately 27 degrees of angulation at the fracture site.  IMPRESSION: Acute comminuted fracture of the proximal right humerus with the fracture line extending to involve the humeral head. There is approximately 27 degrees of angulation on the trans axillary image.  Signed,  [name]. [name], DO  [facility]  [facility]   Electronically Signed   By: Gilmer Mor D.O.   On: 04/12/2015 11:07   Dg Knee Complete 4 Views Left  04/12/2015   CLINICAL DATA:  Larey Seat last week landing onto left knee. Patient describes left knee pain medially with swelling and difficulty bearing weight.  EXAM: LEFT KNEE - COMPLETE 4+ VIEW  COMPARISON:  None available.  FINDINGS: Patient is status post total left knee arthroplasty. Hardware appears intact and well positioned. There is a fracture deformity of the superior patella, as seen on the cross-table lateral view, of uncertain age but possibly acute.  IMPRESSION: Slightly displaced/comminuted fracture deformity of the superior left patella. This is of uncertain age but could certainly be acute. Is patient symptomatic in this area of the patellar?  Status post total left knee arthroplasty. Hardware appears intact and well positioned. No acute fracture or osseous dislocation in the regions of the distal left femur or proximal tib-fib.   Electronically Signed   By: Bary Richard M.D.   On: 04/12/2015 14:03        Subjective: Patient complains of right arm pain and left leg pain, but controlled with opiates. Denies any fevers, chills, chest pain, shortness breath,  abdominal  pain, dysuria, hematuria patient one episode of emesis yesterday. The patient denies any NSAID use.  Objective: Filed Vitals:   04/12/15 1830 04/12/15 1929 04/12/15 2044 04/13/15 0610  BP: 130/67 119/49 105/54 103/39  Pulse: 87 85 89 85  Temp:  98.4 F (36.9 C) 98.6 F (37 C) 98.8 F (37.1 C)  TempSrc:  Oral Axillary Oral  Resp: Height:   (1.549 m)    Weight:  98.793 kg (217 lb 12.8 oz)    SpO2: 95% 97% 96% 97%    Intake/Output Summary (Last 24 hours) at 04/13/15 1120 Last data filed at 04/13/15 1112  Gross per 24 hour  Intake 1117.5 ml  Output    300 ml  Net  817.5 ml   Weight change:  Exam:   General:  Pt is alert, follows commands appropriately, not in acute distress  HEENT: No icterus, No thrush, No neck mass, [place]/AT  Cardiovascular: RRR, S1/S2, no rubs, no gallops  Respiratory: CTA bilaterally, no wheezing, no crackles, no rhonchi  Abdomen: Soft/+BS, non tender, non distended, no guarding; no hepatosplenomegaly  Extremities: No RLE edema, No lymphangitis, No petechiae, No rashes, no synovitis;  LLE in knee immobilizer; no cyanosis or clubbing, capillary refill is 2 seconds  Data Reviewed: Basic Metabolic Panel:  Recent Labs Lab 04/12/15 1455 04/13/15 0420  NA 139 137  K 4.3 4.6  CL 100* 98*  CO2 27 30  GLUCOSE 225* 112*  BUN 17 26*  CREATININE 1.10* 1.77*  CALCIUM 8.9 8.7*   Liver Function Tests: No results for input(s): AST, ALT, ALKPHOS, BILITOT, PROT, ALBUMIN in the last 168 hours. No results for input(s): LIPASE, AMYLASE in the last 168 hours. No results for input(s): AMMONIA in the last 168 hours. CBC:  Recent Labs Lab 04/12/15 1455 04/13/15 0420  WBC 18.3* 13.7*  NEUTROABS 17.1*  --   HGB 13.4 12.1  HCT 41.4 37.1  MCV 84.0 83.6  PLT 283 270   Cardiac Enzymes: No results for input(s): CKTOTAL, CKMB, CKMBINDEX, TROPONINI in the last 168 hours. BNP: Invalid input(s): POCBNP CBG:  Recent Labs Lab 04/12/15 2107  04/13/15 0010 04/13/15 0420 04/13/15 0745 04/13/15 1101  GLUCAP 350* 276* 107* 166* 208*    No results found for this or any previous visit (from the past 240 hour(s)).   Scheduled Meds: . ARIPiprazole  5 mg Oral Daily  . buPROPion  300 mg Oral Daily  . FLUoxetine  40 mg Oral Daily  . gabapentin  300 mg Oral TID  . heparin  5,000 Units Subcutaneous 3 times per day  . insulin aspart  0-15 Units Subcutaneous 6 times per day  . insulin glargine  40 Units Subcutaneous QHS  . levothyroxine  50 mcg Oral QAC breakfast  . simvastatin  10 mg Oral Daily  . sodium chloride  3 mL Intravenous Q12H   Continuous Infusions: . sodium chloride 75 mL/hr at 04/12/15 2218     Falana Clagg, DO  [facility] Pager [phone]  If 7PM-7AM, please contact night-coverage [email or web address] Password [email or web address] 04/13/2015, 11:20 AM   LOS: 1 day

## 2015-04-13 NOTE — Clinical Social Work Placement (Signed)
   CLINICAL SOCIAL WORK PLACEMENT  NOTE  Date:  04/13/2015  Patient Details  Name: [name] MRN: [id number] Date of Birth: 07-27-1961  Clinical Social Work is seeking post-discharge placement for this patient at the Skilled  Nursing Facility level of care (*CSW will initial, date and re-position this form in  chart as items are completed):  Yes   Patient/family provided with [place] Clinical Social Work Department's list of facilities offering this level of care within the geographic area requested by the patient (or if unable, by the patient's family).  Yes   Patient/family informed of their freedom to choose among providers that offer the needed level of care, that participate in Medicare, Medicaid or managed care program needed by the patient, have an available bed and are willing to accept the patient.  Yes   Patient/family informed of [place]'s ownership interest in [facility] and [facility], as well as of the fact that they are under no obligation to receive care at these facilities.  PASRR submitted to EDS on 04/13/15     PASRR number received on       Existing PASRR number confirmed on       FL2 transmitted to all facilities in geographic area requested by pt/family on 04/13/15     FL2 transmitted to all facilities within larger geographic area on       Patient informed that his/her managed care company has contracts with or will negotiate with certain facilities, including the following:            Patient/family informed of bed offers received.  Patient chooses bed at       Physician recommends and patient chooses bed at      Patient to be transferred to   on  .  Patient to be transferred to facility by       Patient family notified on   of transfer.  Name of family member notified:        PHYSICIAN       Additional Comment:    _______________________________________________ Roddie Mc MSW, [place], [place], [phone]

## 2015-04-13 NOTE — Clinical Social Work Note (Signed)
Clinical Social Work Assessment  Patient Details  Name: [name] MRN: [id number] Date of Birth: March 07, 1961  Date of referral:  04/13/15               Reason for consult:  Facility Placement, Discharge Planning                Permission sought to share information with:  Facility Art therapist granted to share information::  Yes, Verbal Permission Granted  Name::        Agency::  SNFs  Relationship::     Contact Information:     Housing/Transportation Living arrangements for the past 2 months:  Apartment Source of Information:  Patient Patient Interpreter Needed:  None Criminal Activity/Legal Involvement Pertinent to Current Situation/Hospitalization:  No - Comment as needed Significant Relationships:  Adult Children, Spouse Lives with:  Adult Children, Spouse Do you feel safe going back to the place where you live?  Yes Need for family participation in patient care:  Yes (Comment)  Care giving concerns:  Patient agrees with SNF placement at discharge as she cannot complete her ADLs or ambulate by herself at this time.   Social Worker assessment / plan:  CSW met with patient at bedside to complete assessment. Patient states she is agreeable to SNF placement at discharge. CSW explained Medicaid bed search process. Patient is agreeable to going to first available facility. She states she wants to remain in [facility] but understands that she may not have this option. Patient is from home with her husband and her daughter who are her main supports. CSW will follow up with bed offers.  Employment status:  Disabled (Comment on whether or not currently receiving Disability) Insurance information:  Medicaid In [place] PT Recommendations:  [place] / Referral to community resources:  [place]  Patient/Family's Response to care:  Patient appears to be happy with the care she has receives, although she continues to say  she is in pain.  Patient/Family's Understanding of and Emotional Response to Diagnosis, Current Treatment, and Prognosis:  Patient appears to have fair insight into reason for admission, diagnosis, and post discharge needs.   Emotional Assessment Appearance:  Appears older than stated age Attitude/Demeanor/Rapport:  Other (Appropriate) Affect (typically observed):  Accepting, Appropriate, Calm, Pleasant Orientation:  Oriented to Self, Oriented to Place, Oriented to  Time, Oriented to Situation Alcohol / Substance use:  Tobacco Use (Hx of) Psych involvement (Current and /or in the community):  No (Comment)  Discharge Needs  Concerns to be addressed:  Discharge Planning Concerns Readmission within the last 30 days:  No Current discharge risk:  Chronically ill, Physical Impairment Barriers to Discharge:  Continued Medical Work up   [employer] MSW, [place], [place], [phone]

## 2015-04-14 ENCOUNTER — Inpatient Hospital Stay (HOSPITAL_COMMUNITY): Payer: Medicaid Other

## 2015-04-14 ENCOUNTER — Encounter (HOSPITAL_COMMUNITY): Payer: Self-pay | Admitting: Physician Assistant

## 2015-04-14 DIAGNOSIS — I342 Nonrheumatic mitral (valve) stenosis: Secondary | ICD-10-CM

## 2015-04-14 DIAGNOSIS — R0600 Dyspnea, unspecified: Secondary | ICD-10-CM

## 2015-04-14 DIAGNOSIS — E1165 Type 2 diabetes mellitus with hyperglycemia: Secondary | ICD-10-CM

## 2015-04-14 DIAGNOSIS — J9620 Acute and chronic respiratory failure, unspecified whether with hypoxia or hypercapnia: Secondary | ICD-10-CM

## 2015-04-14 DIAGNOSIS — N179 Acute kidney failure, unspecified: Secondary | ICD-10-CM

## 2015-04-14 DIAGNOSIS — J9621 Acute and chronic respiratory failure with hypoxia: Secondary | ICD-10-CM

## 2015-04-14 LAB — URINALYSIS, ROUTINE W REFLEX MICROSCOPIC
BILIRUBIN URINE: NEGATIVE
KETONES UR: NEGATIVE mg/dL
Leukocytes, UA: NEGATIVE
Nitrite: NEGATIVE
Protein, ur: NEGATIVE mg/dL
Specific Gravity, Urine: 1.028 (ref 1.005–1.030)
Urobilinogen, UA: 1 mg/dL (ref 0.0–1.0)
pH: 6 (ref 5.0–8.0)

## 2015-04-14 LAB — LIPASE, BLOOD: Lipase: 12 U/L — ABNORMAL LOW (ref 22–51)

## 2015-04-14 LAB — BASIC METABOLIC PANEL
Anion gap: 10 (ref 5–15)
BUN: 37 mg/dL — ABNORMAL HIGH (ref 6–20)
CO2: 26 mmol/L (ref 22–32)
CREATININE: 1.62 mg/dL — AB (ref 0.44–1.00)
Calcium: 7.8 mg/dL — ABNORMAL LOW (ref 8.9–10.3)
Chloride: 95 mmol/L — ABNORMAL LOW (ref 101–111)
GFR calc Af Amer: 40 mL/min — ABNORMAL LOW (ref >60)
GFR, EST NON AFRICAN AMERICAN: 35 mL/min — AB (ref >60)
Glucose, Bld: 214 mg/dL — ABNORMAL HIGH (ref 65–99)
POTASSIUM: 3.8 mmol/L (ref 3.5–5.1)
SODIUM: 131 mmol/L — AB (ref 135–145)

## 2015-04-14 LAB — URINE MICROSCOPIC-ADD ON

## 2015-04-14 LAB — HEPATIC FUNCTION PANEL
ALBUMIN: 3.4 g/dL — AB (ref 3.5–5.0)
ALT: 22 U/L (ref 14–54)
AST: 95 U/L — AB (ref 15–41)
Alkaline Phosphatase: 91 U/L (ref 38–126)
BILIRUBIN DIRECT: 0.2 mg/dL (ref 0.1–0.5)
BILIRUBIN TOTAL: 0.7 mg/dL (ref 0.3–1.2)
Indirect Bilirubin: 0.5 mg/dL (ref 0.3–0.9)
Total Protein: 6.9 g/dL (ref 6.5–8.1)

## 2015-04-14 LAB — HEMOGLOBIN A1C
HEMOGLOBIN A1C: 8.8 % — AB (ref 4.8–5.6)
Mean Plasma Glucose: 206 mg/dL

## 2015-04-14 LAB — GLUCOSE, CAPILLARY
Glucose-Capillary: 168 mg/dL — ABNORMAL HIGH (ref 65–99)
Glucose-Capillary: 177 mg/dL — ABNORMAL HIGH (ref 65–99)
Glucose-Capillary: 191 mg/dL — ABNORMAL HIGH (ref 65–99)
Glucose-Capillary: 198 mg/dL — ABNORMAL HIGH (ref 65–99)
Glucose-Capillary: 262 mg/dL — ABNORMAL HIGH (ref 65–99)
Glucose-Capillary: 266 mg/dL — ABNORMAL HIGH (ref 65–99)

## 2015-04-14 LAB — D-DIMER, QUANTITATIVE: D-Dimer, Quant: 1.49 ug/mL-FEU — ABNORMAL HIGH (ref 0.00–0.48)

## 2015-04-14 LAB — TROPONIN I: Troponin I: <0.03 ng/mL (ref <0.031)

## 2015-04-14 LAB — BRAIN NATRIURETIC PEPTIDE: B NATRIURETIC PEPTIDE 5: 105.9 pg/mL — AB (ref 0.0–100.0)

## 2015-04-14 MED ORDER — FUROSEMIDE 10 MG/ML IJ SOLN
20.0000 mg | Freq: Once | INTRAMUSCULAR | Status: AC
  Administered 2015-04-14: 20 mg via INTRAVENOUS
  Filled 2015-04-14: qty 2

## 2015-04-14 MED ORDER — INSULIN ASPART 100 UNIT/ML ~~LOC~~ SOLN
0.0000 [IU] | Freq: Every day | SUBCUTANEOUS | Status: DC
  Administered 2015-04-15: 4 [IU] via SUBCUTANEOUS
  Administered 2015-04-16: 2 [IU] via SUBCUTANEOUS

## 2015-04-14 MED ORDER — INSULIN ASPART 100 UNIT/ML ~~LOC~~ SOLN
4.0000 [IU] | Freq: Three times a day (TID) | SUBCUTANEOUS | Status: DC
  Administered 2015-04-14 – 2015-04-15 (×3): 4 [IU] via SUBCUTANEOUS

## 2015-04-14 MED ORDER — INSULIN ASPART 100 UNIT/ML ~~LOC~~ SOLN
0.0000 [IU] | Freq: Three times a day (TID) | SUBCUTANEOUS | Status: DC
  Administered 2015-04-14: 8 [IU] via SUBCUTANEOUS
  Administered 2015-04-15 (×2): 5 [IU] via SUBCUTANEOUS
  Administered 2015-04-15 – 2015-04-16 (×2): 3 [IU] via SUBCUTANEOUS
  Administered 2015-04-16: 5 [IU] via SUBCUTANEOUS
  Administered 2015-04-16: 3 [IU] via SUBCUTANEOUS
  Administered 2015-04-17: 2 [IU] via SUBCUTANEOUS
  Administered 2015-04-17: 3 [IU] via SUBCUTANEOUS
  Administered 2015-04-17: 2 [IU] via SUBCUTANEOUS
  Administered 2015-04-18: 3 [IU] via SUBCUTANEOUS
  Administered 2015-04-18: 2 [IU] via SUBCUTANEOUS

## 2015-04-14 NOTE — Progress Notes (Signed)
Physical Therapy Treatment Patient Details Name: [name] MRN: [id number] DOB: Jan 25, 1961 Today's Date: 04/14/2015    History of Present Illness 54 y.o. female admitted to [facility] after a fall with R humerus fx and left patella fx both being treated conservatively at this time (no surgery).  Pt with significant PMHx of HTN, anemia, chronic back pain, neuropathy, depression, anxiety, panic disorder, MI, DM II, home O2 2 L PRN, bipolar d/o, bil foot surgery, back surgery, L shoulder surgery, L Patella fx surgery 1999, and ORIF L ankle fx.      PT Comments    Pt is progressing slowly with her mobility and is limited by pain and weakness.  We still have not found the best assistive device to help her try to walk yet and are limited to very short distances.  I may try a hemi walker next session as cane and hand held assist do not seem to provide enough support. [name] now agreeable to SNF for rehab, realizing yesterday that her daughter and/or husband cannot provide the care she needs at her current mobility level. PT will continue to follow acutely.   Follow Up Recommendations  SNF;Supervision/Assistance - 24 hour     Equipment Recommendations  3in1 (PT)    Recommendations for Other Services   NA     Precautions / Restrictions Precautions Precautions: Fall Precaution Comments: h/o falls Required Braces or Orthoses: Knee Immobilizer - Left Knee Immobilizer - Left: On at all times Restrictions LLE Weight Bearing: Weight bearing as tolerated (in knee immobilizer) Other Position/Activity Restrictions: no flexion of left knee    Mobility         Transfers Overall transfer level: Needs assistance Equipment used: 1 person hand held assist Transfers: Sit to/from Stand Sit to Stand: +2 safety/equipment;Mod assist Stand pivot transfers: +2 safety/equipment;Mod assist       General transfer comment: Two person mod assist for safety.  Pt with flexed posture and heavy reliance on left  upper extremity support for balance.  Gilmer Mor is too unsteady, hand held assist is also unsteady.  May try hemiwalker next session.   Ambulation/Gait Ambulation/Gait assistance: +2 safety/equipment;Mod assist Ambulation Distance (Feet): 3 Feet Assistive device: 1 person hand held assist Gait Pattern/deviations: Step-to pattern;Antalgic;Trunk flexed     General Gait Details: Pt with very flexed posture, however, when cued to correct she has LOB posterior.  Very antalgic gait pattern and not much leverage gained by using left hand in my hand to unweight leg during steps.  Pt is very limited by pain, weakness and both sling and knee brace.           Balance Overall balance assessment: Needs assistance Sitting-balance support: Feet supported;Single extremity supported Sitting balance-Leahy Scale: Good   Postural control: Posterior lean Standing balance support: Single extremity supported Standing balance-Leahy Scale: Poor                      Cognition Arousal/Alertness: Awake/alert Behavior During Therapy: Anxious Overall Cognitive Status: Within Functional Limits for tasks assessed                      Exercises Other Exercises Other Exercises: pt was unable to report exercises reviewed yesterday, so HEP handout given to reinforce ankle pumps, SLR, and hip abduction all in brace.         Pertinent Vitals/Pain Pain Assessment: 0-10 Pain Score: 4  Pain Location: right arm primarily, but also reports left knee and  low back pain with mobility Pain Descriptors / Indicators: Aching;Burning Pain Intervention(s): Limited activity within patient's tolerance;Monitored during session;Premedicated before session;Repositioned           PT Goals (current goals can now be found in the care plan section) Acute Rehab PT Goals Patient Stated Goal: to get better so she can eventually go home Progress towards PT goals: Progressing toward goals    Frequency  Min 3X/week  (now that pt is agreeable to SNF)    PT Plan Current plan remains appropriate;Frequency needs to be updated       End of Session Equipment Utilized During Treatment: Left knee immobilizer;Other (comment) (right arm sling) Activity Tolerance: Patient limited by fatigue;Patient limited by pain Patient left: in chair;with call bell/phone within reach     Time: 1032-1109 PT Time Calculation (min) (ACUTE ONLY): 37 min  Charges:  $Therapeutic Activity: 23-37 mins                      Britt Theard B. Stevie Charter, PT, DPT [phone]   04/14/2015, 1:43 PM

## 2015-04-14 NOTE — Progress Notes (Signed)
*  PRELIMINARY RESULTS* Vascular Ultrasound Lower extremity venous duplex has been completed.  Preliminary findings: negative for DVT   Farrel Demark, RDMS, RVT  04/14/2015, 4:34 PM

## 2015-04-14 NOTE — Progress Notes (Signed)
7/20--Echo results showed EF 65-70% with severe mitral stenosis CXR shows vascular congestion. 08/28/13 echo showed only mild mitral stenosis In setting of hypervolemia and worsen mitral stenosis; consult cardiology for opinion if any further interventions are necessary at this time.  Stop IVF. Lasix IV x 1.  DTat

## 2015-04-14 NOTE — Clinical Social Work Note (Signed)
Clinical Social Worker continuing to follow patient and family for support and discharge planning needs.  Patient with two bed offers in [facility] ([address] [facility] and [facility] pending availability).  Patient is aware of [facility] bed offer and will update regarding [facility] bed offer prior to discharge.  Per MD, patient remains medically inappropriate for discharge.  CSW updated facility.  CSW remains available for support and to facilitate patient discharge needs once medically stable.  Macario Golds, [place] [phone]
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
## 2015-04-14 NOTE — Progress Notes (Signed)
PROGRESS NOTE  [name] AVW:[id number] DOB: 04-04-1961 DOA: 04/12/2015 PCP: [name], MD  Brief history 54 year old female with a history of diabetes mellitus, coronary artery disease, hyperlipidemia, hypothyroidism, and anxiety presented to emergency department after mechanical fall and she tripped on the edge of the sidewalk. Radiographs in the emergency department revealed a right humerus fracture and left patella fracture. Orthopedics was consulted. They did not feel the patient required surgical intervention. The physical therapy and occupation therapy were ordered. Patient denied any presyncopal symptoms, chest discomfort, shortness breath. Assessment/Plan: Acute on chronic respiratory failure -Suspected underlying COPD -Patient was only intermittently using oxygen prior to admission (2 L) -Chest x-ray -BNP -LE duplex -D-Dimer Acute kidney injury -Defined depletion as the patient had an episode of emesis. -IV fluids--slow improvement -UA Right comminuted proximal humerus fracture -Case was discussed with orthopedics, Dr. Ave Filter -minimize activity with R-arm, but pt is able to use hand as tolerated -PT/OT -remain in splint/sling Left nondisplaced patellar fracture -Weightbearing as tolerated--remain in the immobilizer -PT/OT -Pain control N/V -likely related to opioids -LFTs and lipase Atypical chest pain -cycle troponins -EKG without concerning ischemic changes Diabetes mellitus type 2 -Discontinue Invokana, Amaryl for now -continue lower dose Lantus -novolog SSI -add novolog 4 units with meals -HbA1C--8.8 Hypertension -The patient had relative hypotension in the emergency department after intravenous fluids -As result, anti-hypertensive agents were held -Blood pressure remained stable off of antihypertensives meds -continue to monitor COPD with ongoing tobacco abuse -Nicotine patch ordered. Patient is stable and not wheezing. Continue  albuterol inhaler when necessary. History of coronary artery disease, mitral stenosis, MI -Echo--EF 65-70 percent, grade 1 diastolic dysfunction, no WMA -ASA 81 mg daily General anxiety disorder with panic disorder and Agoraphobia -Restart Xanax, Prozac, Wellbutrin, Abilify -Ativan when necessary IV.   Family Communication: Pt at beside Disposition Plan: SNF when stable    Procedures/Studies: Dg Shoulder Right  04/12/2015   CLINICAL DATA:  54 year old female with a history fall.  EXAM: RIGHT SHOULDER - 2+ VIEW  COMPARISON:  01/04/2014  FINDINGS: New acute comminuted fracture of the proximal humerus, with the fracture line involving the humeral head. Trans axillary image demonstrates approximately 27 degrees of angulation at the fracture site.  IMPRESSION: Acute comminuted fracture of the proximal right humerus with the fracture line extending to involve the humeral head. There is approximately 27 degrees of angulation on the trans axillary image.  Signed,  [name]. [name], DO  [facility]  [facility]   Electronically Signed   By: Gilmer Mor D.O.   On: 04/12/2015 11:07   Dg Chest Port 1 View  04/14/2015   CLINICAL DATA:  Dyspnea and hypoxemia.  EXAM: PORTABLE CHEST - 1 VIEW  COMPARISON:  01/04/2014  FINDINGS: Cardiac silhouette remains enlarged, unchanged. There is mild pulmonary vascular congestion and indistinctness with mild interstitial opacities in both lung bases. No pleural effusion or pneumothorax is identified. Prior cervical fusion and left shoulder arthroplasty are noted. Comminuted right humerus fracture is partially visualized as seen on 04/12/2015 shoulder radiographs.  IMPRESSION: Cardiomegaly and mild interstitial edema.   Electronically Signed   By: Sebastian Ache   On: 04/14/2015 13:27   Dg Knee Complete 4 Views Left  04/12/2015   CLINICAL DATA:  Larey Seat last week landing onto left knee. Patient describes left knee pain medially  with swelling and difficulty bearing weight.  EXAM: LEFT KNEE - COMPLETE 4+ VIEW  COMPARISON:  None available.  FINDINGS: Patient is status post total left knee arthroplasty. Hardware appears intact and well positioned. There is a fracture deformity of the superior patella, as seen on the cross-table lateral view, of uncertain age but possibly acute.  IMPRESSION: Slightly displaced/comminuted fracture deformity of the superior left patella. This is of uncertain age but could certainly be acute. Is patient symptomatic in this area of the patellar?  Status post total left knee arthroplasty. Hardware appears intact and well positioned. No acute fracture or osseous dislocation in the regions of the distal left femur or proximal tib-fib.   Electronically Signed   By: Bary Richard M.D.   On: 04/12/2015 14:03        Subjective: Patient complains of intermittent chest discomfort with shortness of breath. She had a few episodes of nausea and vomiting yesterday. Denies any abdominal pain, diarrhea, hematochezia, melena. She denies any fevers, chills, headache, visual disturbance  Objective: Filed Vitals:   04/13/15 1600 04/13/15 1715 04/13/15 2130 04/14/15 0520  BP: 112/43  101/50 107/49  Pulse: 105  84 84  Temp:   99 F (37.2 C) 98.4 F (36.9 C)  TempSrc:   Oral Oral  Resp: 18  18 16   Height:      Weight:    102.422 kg (225 lb 12.8 oz)  SpO2: 78% 96% 96% 95%    Intake/Output Summary (Last 24 hours) at 04/14/15 1332 Last data filed at 04/14/15 1151  Gross per 24 hour  Intake   2010 ml  Output   1000 ml  Net   1010 ml   Weight change: 3.629 kg (8 lb) Exam:   General:  Pt is alert, follows commands appropriately, not in acute distress  HEENT: No icterus, No thrush, No neck mass, [place]/AT  Cardiovascular: RRR, S1/S2, no rubs, no gallops  Respiratory: Diminished breath since the bases. No wheezing  Abdomen: Soft/+BS, non tender, non distended, no hepatosplenomegaly  Extremities: 2+ LE  edema, No lymphangitis, No petechiae, No rashes, no synovitis; No cyanosis or clubbing   Data Reviewed: Basic Metabolic Panel:  Recent Labs Lab 04/12/15 1455 04/13/15 0420 04/14/15 0505  NA 139 137 131*  K 4.3 4.6 3.8  CL 100* 98* 95*  CO2 27 30 26   GLUCOSE 225* 112* 214*  BUN 17 26* 37*  CREATININE 1.10* 1.77* 1.62*  CALCIUM 8.9 8.7* 7.8*   Liver Function Tests: No results for input(s): AST, ALT, ALKPHOS, BILITOT, PROT, ALBUMIN in the last 168 hours. No results for input(s): LIPASE, AMYLASE in the last 168 hours. No results for input(s): AMMONIA in the last 168 hours. CBC:  Recent Labs Lab 04/12/15 1455 04/13/15 0420  WBC 18.3* 13.7*  NEUTROABS 17.1*  --   HGB 13.4 12.1  HCT 41.4 37.1  MCV 84.0 83.6  PLT 283 270   Cardiac Enzymes: No results for input(s): CKTOTAL, CKMB, CKMBINDEX, TROPONINI in the last 168 hours. BNP: Invalid input(s): POCBNP CBG:  Recent Labs Lab 04/13/15 2023 04/14/15 0008 04/14/15 0414 04/14/15 0802 04/14/15 1208  GLUCAP 249* 168* 191* 177* 266*    No results found for this or any previous visit (from the past 240 hour(s)).   Scheduled Meds: . ARIPiprazole  5 mg Oral Daily  . buPROPion  300 mg Oral Daily  . FLUoxetine  40 mg Oral Daily  . gabapentin  300 mg Oral TID  . heparin  5,000 Units Subcutaneous 3 times per day  . insulin aspart  0-15 Units Subcutaneous TID WC  .  insulin aspart  0-5 Units Subcutaneous QHS  . insulin aspart  4 Units Subcutaneous TID WC  . insulin glargine  40 Units Subcutaneous QHS  . levothyroxine  50 mcg Oral QAC breakfast  . simvastatin  10 mg Oral Daily  . sodium chloride  3 mL Intravenous Q12H   Continuous Infusions: . sodium chloride 75 mL/hr at 04/14/15 0321     Christophere Hillhouse, DO  [facility] Pager [phone]  If 7PM-7AM, please contact night-coverage [email or web address] Password [email or web address] 04/14/2015, 1:32 PM   LOS: 2 days

## 2015-04-15 DIAGNOSIS — S4291XD Fracture of right shoulder girdle, part unspecified, subsequent encounter for fracture with routine healing: Secondary | ICD-10-CM

## 2015-04-15 DIAGNOSIS — I5031 Acute diastolic (congestive) heart failure: Secondary | ICD-10-CM

## 2015-04-15 LAB — GLUCOSE, CAPILLARY
GLUCOSE-CAPILLARY: 195 mg/dL — AB (ref 65–99)
GLUCOSE-CAPILLARY: 155 mg/dL — AB (ref 65–99)
GLUCOSE-CAPILLARY: 308 mg/dL — AB (ref 65–99)
Glucose-Capillary: 143 mg/dL — ABNORMAL HIGH (ref 65–99)
Glucose-Capillary: 196 mg/dL — ABNORMAL HIGH (ref 65–99)
Glucose-Capillary: 210 mg/dL — ABNORMAL HIGH (ref 65–99)
Glucose-Capillary: 221 mg/dL — ABNORMAL HIGH (ref 65–99)

## 2015-04-15 LAB — BASIC METABOLIC PANEL
Anion gap: 9 (ref 5–15)
BUN: 29 mg/dL — ABNORMAL HIGH (ref 6–20)
CO2: 27 mmol/L (ref 22–32)
Calcium: 8 mg/dL — ABNORMAL LOW (ref 8.9–10.3)
Chloride: 97 mmol/L — ABNORMAL LOW (ref 101–111)
Creatinine, Ser: 1.18 mg/dL — ABNORMAL HIGH (ref 0.44–1.00)
GFR calc Af Amer: 59 mL/min — ABNORMAL LOW (ref >60)
GFR, EST NON AFRICAN AMERICAN: 51 mL/min — AB (ref >60)
Glucose, Bld: 189 mg/dL — ABNORMAL HIGH (ref 65–99)
Potassium: 3.9 mmol/L (ref 3.5–5.1)
SODIUM: 133 mmol/L — AB (ref 135–145)

## 2015-04-15 LAB — CBC
HCT: 32.4 % — ABNORMAL LOW (ref 36.0–46.0)
Hemoglobin: 10.4 g/dL — ABNORMAL LOW (ref 12.0–15.0)
MCH: 27.2 pg (ref 26.0–34.0)
MCHC: 32.1 g/dL (ref 30.0–36.0)
MCV: 84.8 fL (ref 78.0–100.0)
PLATELETS: 210 10*3/uL (ref 150–400)
RBC: 3.82 MIL/uL — AB (ref 3.87–5.11)
RDW: 16.3 % — AB (ref 11.5–15.5)
WBC: 12.2 10*3/uL — AB (ref 4.0–10.5)

## 2015-04-15 LAB — TROPONIN I

## 2015-04-15 MED ORDER — INSULIN GLARGINE 100 UNIT/ML ~~LOC~~ SOLN
45.0000 [IU] | Freq: Every day | SUBCUTANEOUS | Status: DC
  Administered 2015-04-15: 45 [IU] via SUBCUTANEOUS
  Filled 2015-04-15 (×2): qty 0.45

## 2015-04-15 MED ORDER — INSULIN ASPART 100 UNIT/ML ~~LOC~~ SOLN
5.0000 [IU] | Freq: Three times a day (TID) | SUBCUTANEOUS | Status: DC
  Administered 2015-04-15 – 2015-04-16 (×3): 5 [IU] via SUBCUTANEOUS

## 2015-04-15 MED ORDER — FUROSEMIDE 10 MG/ML IJ SOLN
20.0000 mg | Freq: Two times a day (BID) | INTRAMUSCULAR | Status: DC
  Administered 2015-04-15 – 2015-04-16 (×2): 20 mg via INTRAVENOUS
  Filled 2015-04-15 (×4): qty 2

## 2015-04-15 NOTE — Progress Notes (Signed)
PROGRESS NOTE  [name] ZOX:[id number] DOB: 17-Oct-1960 DOA: 04/12/2015 PCP: [name], MD  Brief history 54 year old female with a history of diabetes mellitus, coronary artery disease, hyperlipidemia, hypothyroidism, and anxiety presented to emergency department after mechanical fall and she tripped on the edge of the sidewalk. Radiographs in the emergency department revealed a right humerus fracture and left patella fracture. Orthopedics was consulted. They did not feel the patient required surgical intervention. The physical therapy and occupation therapy were ordered. Patient denied any presyncopal symptoms, chest discomfort, shortness breath. Assessment/Plan: Acute on chronic respiratory failure -pulm edema in setting of underlying COPD -Patient was only intermittently using oxygen prior to admission (2 L) -Chest x-ray--pulm vasc congestion -BNP--105 -LE duplex--neg -D-Dimer--elevated but low clinical suspicion for VTE Acute diastolic CHF -appreciate cardiology evaluation-->no acute intervention for mitral stenosis at this time -continue IV Lasix -I/O -daily weights Acute kidney injury -Defined depletion as the patient had an episode of emesis. -IV fluids--slow improvement -UA--no pyuria or proteinuria Right comminuted proximal humerus fracture -Case was discussed with orthopedics, Dr. Ave Filter -minimize activity with R-arm, but pt is able to use hand as tolerated -PT/OT-->SNF -remain in splint/sling Left nondisplaced patellar fracture -Weightbearing as tolerated--remain in the immobilizer -PT/OT -Pain control N/V -likely related to opioids -LFTs and lipase Atypical chest pain -cycle troponins--neg x 3 -EKG without concerning ischemic changes Diabetes mellitus type 2 -Discontinue Invokana, Amaryl for now -Increase Lantus to 45 units with meals -novolog SSI -increase novolog 5 units with meals -HbA1C--8.8 Hypertension -The patient had relative  hypotension in the emergency department after intravenous fluids -As result, anti-hypertensive agents were held -Blood pressure remained stable off of antihypertensives meds -continue to monitor COPD with ongoing tobacco abuse -Nicotine patch ordered. Patient is stable and not wheezing. Continue albuterol inhaler when necessary. History of coronary artery disease, mitral stenosis, MI -Echo--EF 65-70 percent, grade 1 diastolic dysfunction, no WMA -ASA 81 mg daily General anxiety disorder with panic disorder and Agoraphobia -Restart Xanax, Prozac, Wellbutrin, Abilify -Ativan when necessary IV.   Family Communication: Pt at beside Disposition Plan: SNF 1-2 days    Procedures/Studies: Dg Shoulder Right  04/12/2015   CLINICAL DATA:  54 year old female with a history fall.  EXAM: RIGHT SHOULDER - 2+ VIEW  COMPARISON:  01/04/2014  FINDINGS: New acute comminuted fracture of the proximal humerus, with the fracture line involving the humeral head. Trans axillary image demonstrates approximately 27 degrees of angulation at the fracture site.  IMPRESSION: Acute comminuted fracture of the proximal right humerus with the fracture line extending to involve the humeral head. There is approximately 27 degrees of angulation on the trans axillary image.  Signed,  [name]. [name], DO  [facility]  [facility]   Electronically Signed   By: Gilmer Mor D.O.   On: 04/12/2015 11:07   Dg Chest Port 1 View  04/14/2015   CLINICAL DATA:  Dyspnea and hypoxemia.  EXAM: PORTABLE CHEST - 1 VIEW  COMPARISON:  01/04/2014  FINDINGS: Cardiac silhouette remains enlarged, unchanged. There is mild pulmonary vascular congestion and indistinctness with mild interstitial opacities in both lung bases. No pleural effusion or pneumothorax is identified. Prior cervical fusion and left shoulder arthroplasty are noted. Comminuted right humerus fracture is partially visualized as seen on  04/12/2015 shoulder radiographs.  IMPRESSION: Cardiomegaly and mild interstitial edema.   Electronically Signed   By: Sebastian Ache   On: 04/14/2015 13:27   Dg Knee Complete  4 Views Left  04/12/2015   CLINICAL DATA:  Larey Seat last week landing onto left knee. Patient describes left knee pain medially with swelling and difficulty bearing weight.  EXAM: LEFT KNEE - COMPLETE 4+ VIEW  COMPARISON:  None available.  FINDINGS: Patient is status post total left knee arthroplasty. Hardware appears intact and well positioned. There is a fracture deformity of the superior patella, as seen on the cross-table lateral view, of uncertain age but possibly acute.  IMPRESSION: Slightly displaced/comminuted fracture deformity of the superior left patella. This is of uncertain age but could certainly be acute. Is patient symptomatic in this area of the patellar?  Status post total left knee arthroplasty. Hardware appears intact and well positioned. No acute fracture or osseous dislocation in the regions of the distal left femur or proximal tib-fib.   Electronically Signed   By: Bary Richard M.D.   On: 04/12/2015 14:03        Subjective: Patient is breathing better today after diuresis. Denies any fevers, chills, chest pain, nausea, vomiting, diarrhea, abdominal pain, dysuria, hematuria. No rashes.  Objective: Filed Vitals:   04/14/15 1355 04/14/15 1741 04/14/15 2143 04/15/15 0541  BP: 111/64 115/63 125/56 119/60  Pulse: 51 92 87 84  Temp: 99 F (37.2 C) 98.9 F (37.2 C) 99.8 F (37.7 C) 99 F (37.2 C)  TempSrc: Oral  Oral Oral  Resp: 18  19 19   Height:      Weight:      SpO2: 93% 93% 93% 99%    Intake/Output Summary (Last 24 hours) at 04/15/15 1402 Last data filed at 04/15/15 1126  Gross per 24 hour  Intake    460 ml  Output   2100 ml  Net  -1640 ml   Weight change:  Exam:   General:  Pt is alert, follows commands appropriately, not in acute distress  HEENT: No icterus, No thrush, No neck mass,  [place]/AT  Cardiovascular: RRR, S1/S2, no rubs, no gallops  Respiratory: Bibasilar crackles. No wheeze. Good air movement.  Abdomen: Soft/+BS, non tender, non distended, no guarding; no hepatosplenomegaly  Extremities: 2+ LE edema, No lymphangitis, No petechiae, No rashes, no synovitis; no cyanosis or clubbing  Data Reviewed: Basic Metabolic Panel:  Recent Labs Lab 04/12/15 1455 04/13/15 0420 04/14/15 0505 04/15/15 0430  NA 139 137 131* 133*  K 4.3 4.6 3.8 3.9  CL 100* 98* 95* 97*  CO2 27 30 26 27   GLUCOSE 225* 112* 214* 189*  BUN 17 26* 37* 29*  CREATININE 1.10* 1.77* 1.62* 1.18*  CALCIUM 8.9 8.7* 7.8* 8.0*   Liver Function Tests:  Recent Labs Lab 04/14/15 1400  AST 95*  ALT 22  ALKPHOS 91  BILITOT 0.7  PROT 6.9  ALBUMIN 3.4*    Recent Labs Lab 04/14/15 1400  LIPASE 12*   No results for input(s): AMMONIA in the last 168 hours. CBC:  Recent Labs Lab 04/12/15 1455 04/13/15 0420 04/15/15 0430  WBC 18.3* 13.7* 12.2*  NEUTROABS 17.1*  --   --   HGB 13.4 12.1 10.4*  HCT 41.4 37.1 32.4*  MCV 84.0 83.6 84.8  PLT 283 270 210   Cardiac Enzymes:  Recent Labs Lab 04/14/15 1400 04/14/15 1839 04/15/15 0040  TROPONINI <0.03 <0.03 <0.03   BNP: Invalid input(s): POCBNP CBG:  Recent Labs Lab 04/15/15 0011 04/15/15 0349 04/15/15 0434 04/15/15 0754 04/15/15 1150  GLUCAP 143* 195* 196* 210* 221*    Recent Results (from the past 240 hour(s))  Urine culture  Status: None (Preliminary result)   Collection Time: 04/14/15  3:04 PM  Result Value Ref Range Status   Specimen Description URINE, CLEAN CATCH  Final   Special Requests NONE  Final   Culture CULTURE REINCUBATED FOR BETTER GROWTH  Final   Report Status PENDING  Incomplete     Scheduled Meds: . ARIPiprazole  5 mg Oral Daily  . buPROPion  300 mg Oral Daily  . FLUoxetine  40 mg Oral Daily  . furosemide  20 mg Intravenous BID  . gabapentin  300 mg Oral TID  . heparin  5,000 Units  Subcutaneous 3 times per day  . insulin aspart  0-15 Units Subcutaneous TID WC  . insulin aspart  0-5 Units Subcutaneous QHS  . insulin aspart  5 Units Subcutaneous TID WC  . insulin glargine  45 Units Subcutaneous QHS  . levothyroxine  50 mcg Oral QAC breakfast  . simvastatin  10 mg Oral Daily  . sodium chloride  3 mL Intravenous Q12H   Continuous Infusions:    Stylianos Stradling, DO  [facility] Pager [phone]  If 7PM-7AM, please contact night-coverage [email or web address] Password [email or web address] 04/15/2015, 2:02 PM   LOS: 3 days

## 2015-04-16 ENCOUNTER — Inpatient Hospital Stay (HOSPITAL_COMMUNITY): Payer: Medicaid Other

## 2015-04-16 DIAGNOSIS — J9622 Acute and chronic respiratory failure with hypercapnia: Secondary | ICD-10-CM

## 2015-04-16 DIAGNOSIS — S42301K Unspecified fracture of shaft of humerus, right arm, subsequent encounter for fracture with nonunion: Secondary | ICD-10-CM

## 2015-04-16 LAB — BASIC METABOLIC PANEL
Anion gap: 7 (ref 5–15)
BUN: 21 mg/dL — ABNORMAL HIGH (ref 6–20)
CO2: 32 mmol/L (ref 22–32)
Calcium: 8.2 mg/dL — ABNORMAL LOW (ref 8.9–10.3)
Chloride: 94 mmol/L — ABNORMAL LOW (ref 101–111)
Creatinine, Ser: 0.94 mg/dL (ref 0.44–1.00)
GFR calc Af Amer: >60 mL/min (ref >60)
GFR calc non Af Amer: >60 mL/min (ref >60)
Glucose, Bld: 212 mg/dL — ABNORMAL HIGH (ref 65–99)
Potassium: 3.7 mmol/L (ref 3.5–5.1)
SODIUM: 133 mmol/L — AB (ref 135–145)

## 2015-04-16 LAB — URINE CULTURE

## 2015-04-16 LAB — GLUCOSE, CAPILLARY
GLUCOSE-CAPILLARY: 208 mg/dL — AB (ref 65–99)
GLUCOSE-CAPILLARY: 186 mg/dL — AB (ref 65–99)
Glucose-Capillary: 195 mg/dL — ABNORMAL HIGH (ref 65–99)
Glucose-Capillary: 222 mg/dL — ABNORMAL HIGH (ref 65–99)

## 2015-04-16 MED ORDER — IPRATROPIUM-ALBUTEROL 0.5-2.5 (3) MG/3ML IN SOLN
3.0000 mL | Freq: Four times a day (QID) | RESPIRATORY_TRACT | Status: DC
  Administered 2015-04-16 – 2015-04-18 (×6): 3 mL via RESPIRATORY_TRACT
  Filled 2015-04-16 (×8): qty 3

## 2015-04-16 MED ORDER — INSULIN ASPART 100 UNIT/ML ~~LOC~~ SOLN
6.0000 [IU] | Freq: Three times a day (TID) | SUBCUTANEOUS | Status: DC
  Administered 2015-04-17 – 2015-04-18 (×5): 6 [IU] via SUBCUTANEOUS

## 2015-04-16 MED ORDER — INSULIN GLARGINE 100 UNIT/ML ~~LOC~~ SOLN
50.0000 [IU] | Freq: Every day | SUBCUTANEOUS | Status: DC
  Administered 2015-04-16 – 2015-04-17 (×2): 50 [IU] via SUBCUTANEOUS
  Filled 2015-04-16 (×3): qty 0.5

## 2015-04-16 MED ORDER — FUROSEMIDE 10 MG/ML IJ SOLN
40.0000 mg | Freq: Two times a day (BID) | INTRAMUSCULAR | Status: DC
  Administered 2015-04-17: 40 mg via INTRAVENOUS
  Filled 2015-04-16 (×2): qty 4

## 2015-04-16 MED ORDER — FUROSEMIDE 10 MG/ML IJ SOLN
60.0000 mg | Freq: Once | INTRAMUSCULAR | Status: AC
  Administered 2015-04-16: 60 mg via INTRAVENOUS
  Filled 2015-04-16: qty 6

## 2015-04-16 NOTE — Progress Notes (Signed)
PROGRESS NOTE  [name] QMV:[id number] DOB: 04/20/1961 DOA: 04/12/2015 PCP: [name], MD  Brief history 54 year old female with a history of diabetes mellitus, coronary artery disease, hyperlipidemia, hypothyroidism, and anxiety presented to emergency department after mechanical fall and she tripped on the edge of the sidewalk. Radiographs in the emergency department revealed a right humerus fracture and left patella fracture. Orthopedics was consulted. They did not feel the patient required surgical intervention. The physical therapy and occupation therapy were ordered. Patient denied any presyncopal symptoms, chest discomfort, shortness breath. Assessment/Plan: Acute on chronic respiratory failure -pulm edema in setting of underlying COPD, OHS -Patient was only intermittently using oxygen prior to admission (2 L) -7/22-Chest x-ray--pulm vasc congestion -BNP--105 -LE duplex--neg -D-Dimer--elevated but low clinical suspicion for VTE -04/16/2015--repeat chest x-ray as patient continues to complain of shortness of breath that has not much improved -start duonebs Acute diastolic CHF -appreciate cardiology evaluation-->no acute intervention for mitral stenosis at this time -likely R-side Heart Failure -continue IV Lasix--increase dose to 60mg  today -I/O--neg approx 2L -daily weights--question accuracy Acute kidney injury -improved with diuresis -UA--no pyuria or proteinuria Right comminuted proximal humerus fracture -Case was discussed with orthopedics, Dr. Ave Filter -minimize activity with R-arm, but pt is able to use hand as tolerated -PT/OT-->SNF -remain in splint/sling Left nondisplaced patellar fracture -Weightbearing as tolerated--remain in the immobilizer -PT/OT -Pain control N/V -likely related to opioids -LFTs and lipase Atypical chest pain -cycle troponins--neg x 3 -EKG without concerning ischemic changes Diabetes mellitus type 2 -Discontinue  Invokana, Amaryl for now -Increase Lantus to 50 units with meals -novolog SSI -increase novolog 6 units with meals -HbA1C--8.8 Hypertension -The patient had relative hypotension in the emergency department after intravenous fluids -As result, anti-hypertensive agents were held -Blood pressure remains stable off of antihypertensives meds -continue to monitor COPD with ongoing tobacco abuse -Nicotine patch ordered. Patient is stable and not wheezing. Continue albuterol inhaler when necessary. History of coronary artery disease, mitral stenosis, MI -Echo--EF 65-70 percent, grade 1 diastolic dysfunction, no WMA -ASA 81 mg daily General anxiety disorder with panic disorder and Agoraphobia -Restart Xanax, Prozac, Wellbutrin, Abilify -Ativan when necessary IV.   Family Communication: Pt at beside Disposition Plan: SNF 1-2 days    Procedures/Studies: Dg Shoulder Right  04/12/2015   CLINICAL DATA:  54 year old female with a history fall.  EXAM: RIGHT SHOULDER - 2+ VIEW  COMPARISON:  01/04/2014  FINDINGS: New acute comminuted fracture of the proximal humerus, with the fracture line involving the humeral head. Trans axillary image demonstrates approximately 27 degrees of angulation at the fracture site.  IMPRESSION: Acute comminuted fracture of the proximal right humerus with the fracture line extending to involve the humeral head. There is approximately 27 degrees of angulation on the trans axillary image.  Signed,  [name]. [name], DO  [facility]  [facility]   Electronically Signed   By: Gilmer Mor D.O.   On: 04/12/2015 11:07   Dg Chest Port 1 View  04/14/2015   CLINICAL DATA:  Dyspnea and hypoxemia.  EXAM: PORTABLE CHEST - 1 VIEW  COMPARISON:  01/04/2014  FINDINGS: Cardiac silhouette remains enlarged, unchanged. There is mild pulmonary vascular congestion and indistinctness with mild interstitial opacities in both lung bases. No pleural  effusion or pneumothorax is identified. Prior cervical fusion and left shoulder arthroplasty are noted. Comminuted right humerus fracture is partially visualized as seen on 04/12/2015 shoulder radiographs.  IMPRESSION: Cardiomegaly and mild interstitial  edema.   Electronically Signed   By: Sebastian Ache   On: 04/14/2015 13:27   Dg Knee Complete 4 Views Left  04/12/2015   CLINICAL DATA:  Larey Seat last week landing onto left knee. Patient describes left knee pain medially with swelling and difficulty bearing weight.  EXAM: LEFT KNEE - COMPLETE 4+ VIEW  COMPARISON:  None available.  FINDINGS: Patient is status post total left knee arthroplasty. Hardware appears intact and well positioned. There is a fracture deformity of the superior patella, as seen on the cross-table lateral view, of uncertain age but possibly acute.  IMPRESSION: Slightly displaced/comminuted fracture deformity of the superior left patella. This is of uncertain age but could certainly be acute. Is patient symptomatic in this area of the patellar?  Status post total left knee arthroplasty. Hardware appears intact and well positioned. No acute fracture or osseous dislocation in the regions of the distal left femur or proximal tib-fib.   Electronically Signed   By: Bary Richard M.D.   On: 04/12/2015 14:03        Subjective: Patient continues to complain of shortness of breath that has not improved much. Denies any nausea, vomiting, diarrhea, abdominal pain. Denies any chest pain, headache, fevers, chills, hemoptysis, hematochezia, melena. No dysuria or hematuria. No rashes.  Objective: Filed Vitals:   04/15/15 0541 04/15/15 1410 04/15/15 2118 04/16/15 0619  BP: 119/60 110/70 148/60 137/60  Pulse: 84 81 108 82  Temp: 99 F (37.2 C) 98 F (36.7 C) 98.2 F (36.8 C) 98.7 F (37.1 C)  TempSrc: Oral Oral Oral Oral  Resp: Height:      Weight:    102.604 kg (226 lb 3.2 oz)  SpO2: 99%  98% 99%    Intake/Output Summary (Last  24 hours) at 04/16/15 1406 Last data filed at 04/16/15 1130  Gross per 24 hour  Intake    440 ml  Output   1850 ml  Net  -1410 ml   Weight change:  Exam:   General:  Pt is alert, follows commands appropriately, not in acute distress  HEENT: No icterus, No thrush, No neck mass, [place]/AT  Cardiovascular: RRR, S1/S2, no rubs, no gallops  Respiratory: Fine bibasilar crackles. No wheezing.  Abdomen: Soft/+BS, non tender, non distended, no guarding; no hepatosplenomegaly  Extremities: 2+LE edema, No lymphangitis, No petechiae, No rashes, no synovitis; no cyanosis or clubbing  Data Reviewed: Basic Metabolic Panel:  Recent Labs Lab 04/12/15 1455 04/13/15 0420 04/14/15 0505 04/15/15 0430 04/16/15 0455  NA 139 137 131* 133* 133*  K 4.3 4.6 3.8 3.9 3.7  CL 100* 98* 95* 97* 94*  CO2 32  GLUCOSE 225* 112* 214* 189* 212*  BUN 17 26* 37* 29* 21*  CREATININE 1.10* 1.77* 1.62* 1.18* 0.94  CALCIUM 8.9 8.7* 7.8* 8.0* 8.2*   Liver Function Tests:  Recent Labs Lab 04/14/15 1400  AST 95*  ALT 22  ALKPHOS 91  BILITOT 0.7  PROT 6.9  ALBUMIN 3.4*    Recent Labs Lab 04/14/15 1400  LIPASE 12*   No results for input(s): AMMONIA in the last 168 hours. CBC:  Recent Labs Lab 04/12/15 1455 04/13/15 0420 04/15/15 0430  WBC 18.3* 13.7* 12.2*  NEUTROABS 17.1*  --   --   HGB 13.4 12.1 10.4*  HCT 41.4 37.1 32.4*  MCV 84.0 83.6 84.8  PLT 283 270 210   Cardiac Enzymes:  Recent Labs Lab 04/14/15 1400 04/14/15 1839 04/15/15 0040  TROPONINI <0.03 <0.03 <0.03   BNP: Invalid input(s): POCBNP CBG:  Recent Labs Lab 04/15/15 1150 04/15/15 1655 04/15/15 2116 04/16/15 0753 04/16/15 1209  GLUCAP 221* 155* 308* 195* 208*    Recent Results (from the past 240 hour(s))  Urine culture     Status: None   Collection Time: 04/14/15  3:04 PM  Result Value Ref Range Status   Specimen Description URINE, CLEAN CATCH  Final   Special Requests NONE  Final   Culture  MULTIPLE SPECIES PRESENT, SUGGEST RECOLLECTION  Final   Report Status 04/16/2015 FINAL  Final     Scheduled Meds: . ARIPiprazole  5 mg Oral Daily  . buPROPion  300 mg Oral Daily  . FLUoxetine  40 mg Oral Daily  . [START ON 04/17/2015] furosemide  40 mg Intravenous BID  . furosemide  60 mg Intravenous Once  . gabapentin  300 mg Oral TID  . heparin  5,000 Units Subcutaneous 3 times per day  . insulin aspart  0-15 Units Subcutaneous TID WC  . insulin aspart  0-5 Units Subcutaneous QHS  . insulin aspart  6 Units Subcutaneous TID WC  . insulin glargine  50 Units Subcutaneous QHS  . ipratropium-albuterol  3 mL Nebulization Q6H  . levothyroxine  50 mcg Oral QAC breakfast  . simvastatin  10 mg Oral Daily  . sodium chloride  3 mL Intravenous Q12H   Continuous Infusions:    Jamilla Galli, DO  [facility] Pager [phone]  If 7PM-7AM, please contact night-coverage [email or web address] Password [email or web address] 04/16/2015, 2:06 PM   LOS: 4 days

## 2015-04-17 LAB — BASIC METABOLIC PANEL
ANION GAP: 8 (ref 5–15)
BUN: 18 mg/dL (ref 6–20)
CHLORIDE: 91 mmol/L — AB (ref 101–111)
CO2: 34 mmol/L — ABNORMAL HIGH (ref 22–32)
Calcium: 8.3 mg/dL — ABNORMAL LOW (ref 8.9–10.3)
Creatinine, Ser: 0.79 mg/dL (ref 0.44–1.00)
GFR calc Af Amer: >60 mL/min (ref >60)
Glucose, Bld: 167 mg/dL — ABNORMAL HIGH (ref 65–99)
Potassium: 3.2 mmol/L — ABNORMAL LOW (ref 3.5–5.1)
SODIUM: 133 mmol/L — AB (ref 135–145)

## 2015-04-17 LAB — GLUCOSE, CAPILLARY
GLUCOSE-CAPILLARY: 145 mg/dL — AB (ref 65–99)
GLUCOSE-CAPILLARY: 156 mg/dL — AB (ref 65–99)
GLUCOSE-CAPILLARY: 123 mg/dL — AB (ref 65–99)
Glucose-Capillary: 149 mg/dL — ABNORMAL HIGH (ref 65–99)

## 2015-04-17 MED ORDER — OXYCODONE HCL 5 MG PO TABS
2.5000 mg | ORAL_TABLET | Freq: Four times a day (QID) | ORAL | Status: DC | PRN
  Administered 2015-04-17 – 2015-04-18 (×2): 2.5 mg via ORAL
  Filled 2015-04-17 (×2): qty 1

## 2015-04-17 MED ORDER — FUROSEMIDE 40 MG PO TABS
40.0000 mg | ORAL_TABLET | Freq: Every day | ORAL | Status: DC
  Administered 2015-04-18: 40 mg via ORAL
  Filled 2015-04-17: qty 1

## 2015-04-17 MED ORDER — POTASSIUM CHLORIDE 10 MEQ/100ML IV SOLN
10.0000 meq | INTRAVENOUS | Status: AC
  Filled 2015-04-17: qty 100

## 2015-04-17 MED ORDER — POTASSIUM CHLORIDE CRYS ER 20 MEQ PO TBCR
40.0000 meq | EXTENDED_RELEASE_TABLET | Freq: Once | ORAL | Status: AC
  Administered 2015-04-17: 40 meq via ORAL
  Filled 2015-04-17: qty 2

## 2015-04-17 MED ORDER — FUROSEMIDE 10 MG/ML IJ SOLN
80.0000 mg | Freq: Once | INTRAMUSCULAR | Status: AC
  Administered 2015-04-17: 80 mg via INTRAVENOUS
  Filled 2015-04-17: qty 8

## 2015-04-17 MED ORDER — OXYCODONE-ACETAMINOPHEN 5-325 MG PO TABS
1.0000 | ORAL_TABLET | Freq: Four times a day (QID) | ORAL | Status: DC | PRN
  Administered 2015-04-17 – 2015-04-18 (×2): 1 via ORAL
  Filled 2015-04-17 (×2): qty 1

## 2015-04-17 MED ORDER — ALBUTEROL SULFATE (2.5 MG/3ML) 0.083% IN NEBU: 2.5000 mg | INHALATION_SOLUTION | RESPIRATORY_TRACT | Status: DC | PRN

## 2015-04-17 MED ORDER — OXYCODONE-ACETAMINOPHEN 7.5-325 MG PO TABS: 1.0000 | ORAL_TABLET | Freq: Four times a day (QID) | ORAL | Status: DC | PRN

## 2015-04-17 MED ORDER — SALINE SPRAY 0.65 % NA SOLN
1.0000 | NASAL | Status: DC | PRN
  Administered 2015-04-17: 1 via NASAL
  Filled 2015-04-17: qty 44

## 2015-04-17 NOTE — Plan of Care (Signed)
Problem: Phase I Progression Outcomes Goal: Initial discharge plan identified Outcome: Completed/Met Date Met:  04/17/15 Pt/ for SNF placement

## 2015-04-17 NOTE — Progress Notes (Addendum)
PROGRESS NOTE  [name]:[id number] DOB: 1961/09/12 DOA: 04/12/2015 PCP: [name], MD  Brief history 54 year old female with a history of diabetes mellitus, coronary artery disease, hyperlipidemia, hypothyroidism, and anxiety presented to emergency department after mechanical fall and she tripped on the edge of the sidewalk. Radiographs in the emergency department revealed a right humerus fracture and left patella fracture. Orthopedics was consulted. They did not feel the patient required surgical intervention. The physical therapy and occupation therapy were ordered. Patient denied any presyncopal symptoms, chest discomfort, shortness breath. However, on 04/14/2015, the patient had increasing shortness of breath. Workup revealed pulmonary edema. The patient was started on intravenous furosemide with good clinical response.   Assessment/Plan: Acute on chronic respiratory failure -pulm edema in setting of underlying COPD, OHS -Patient was only intermittently using oxygen prior to admission (2 L) -7/22-Chest x-ray--pulm vasc congestion -BNP--105 -LE duplex--neg -D-Dimer--elevated but low clinical suspicion for VTE -04/16/2015--repeat chest x-ray as patient continues to complain of shortness of breath that has not much improved -continue duonebs Acute diastolic CHF -appreciate cardiology evaluation-->no acute intervention for mitral stenosis at this time -likely R-side Heart Failure -continue IV lasix -I/O--neg approx 3L -daily weights--question accuracy Acute kidney injury -improved with diuresis -UA--no pyuria or proteinuria Right comminuted proximal humerus fracture -Case was discussed with orthopedics, Dr. Ave Filter -minimize activity with R-arm, but pt is able to use hand as tolerated -PT/OT-->SNF -remain in splint/sling Left nondisplaced patellar fracture -Weightbearing as tolerated--remain in the immobilizer -PT/OT-->SNF -Pain control N/V -likely  related to opioids -LFTs and lipase -improved, tolerating diet Atypical chest pain -cycle troponins--neg x 3 -EKG without concerning ischemic changes Diabetes mellitus type 2 -Discontinue Invokana, Amaryl for now -Increase Lantus to 52 units  -novolog SSI -Continue novolog 6 units with meals -HbA1C--8.8 Hypertension -The patient had relative hypotension in the emergency department after intravenous fluids -As result, anti-hypertensive agents were held -Blood pressure remains stable off of antihypertensives meds -continue to monitor -start low dose BB in setting of CHF COPD with ongoing tobacco abuse -Nicotine patch ordered. Patient is stable and not wheezing. Continue albuterol inhaler when necessary. History of coronary artery disease, mitral stenosis, MI -Echo--EF 65-70 percent, grade 1 diastolic dysfunction, no WMA -ASA 81 mg daily General anxiety disorder with panic disorder and Agoraphobia -Restart Xanax, Prozac, Wellbutrin, Abilify -Ativan when necessary IV.   Family Communication: Pt at beside Disposition Plan: SNF 7/26 if stable    Procedures/Studis: Dg Shoulder Right  04/12/2015   CLINICAL DATA:  54 year old female with a history fall.  EXAM: RIGHT SHOULDER - 2+ VIEW  COMPARISON:  01/04/2014  FINDINGS: New acute comminuted fracture of the proximal humerus, with the fracture line involving the humeral head. Trans axillary image demonstrates approximately 27 degrees of angulation at the fracture site.  IMPRESSION: Acute comminuted fracture of the proximal right humerus with the fracture line extending to involve the humeral head. There is approximately 27 degrees of angulation on the trans axillary image.  Signed,  [name]. [name], DO  [facility]  [facility]   Electronically Signed   By: Gilmer Mor D.O.   On: 04/12/2015 11:07   Dg Chest Port 1 View  04/16/2015   CLINICAL DATA:  Dyspnea, history of congestive heart  failure  EXAM: PORTABLE CHEST - 1 VIEW  COMPARISON:  04/14/2015  FINDINGS: Moderate cardiac enlargement stable. Central vascular congestion similar to prior study. Mild interstitial prominence also unchanged primarily at the  lung bases. This appears to represent a combination of subsegmental atelectasis and likely mild interstitial edema.  IMPRESSION: Congestive heart failure with mild interstitial edema similar to prior study   Electronically Signed   By: Esperanza Heir M.D.   On: 04/16/2015 16:36   Dg Chest Port 1 View  04/14/2015   CLINICAL DATA:  Dyspnea and hypoxemia.  EXAM: PORTABLE CHEST - 1 VIEW  COMPARISON:  01/04/2014  FINDINGS: Cardiac silhouette remains enlarged, unchanged. There is mild pulmonary vascular congestion and indistinctness with mild interstitial opacities in both lung bases. No pleural effusion or pneumothorax is identified. Prior cervical fusion and left shoulder arthroplasty are noted. Comminuted right humerus fracture is partially visualized as seen on 04/12/2015 shoulder radiographs.  IMPRESSION: Cardiomegaly and mild interstitial edema.   Electronically Signed   By: Sebastian Ache   On: 04/14/2015 13:27   Dg Knee Complete 4 Views Left  04/12/2015   CLINICAL DATA:  Larey Seat last week landing onto left knee. Patient describes left knee pain medially with swelling and difficulty bearing weight.  EXAM: LEFT KNEE - COMPLETE 4+ VIEW  COMPARISON:  None available.  FINDINGS: Patient is status post total left knee arthroplasty. Hardware appears intact and well positioned. There is a fracture deformity of the superior patella, as seen on the cross-table lateral view, of uncertain age but possibly acute.  IMPRESSION: Slightly displaced/comminuted fracture deformity of the superior left patella. This is of uncertain age but could certainly be acute. Is patient symptomatic in this area of the patellar?  Status post total left knee arthroplasty. Hardware appears intact and well positioned. No acute  fracture or osseous dislocation in the regions of the distal left femur or proximal tib-fib.   Electronically Signed   By: Bary Richard M.D.   On: 04/12/2015 14:03         Subjective: Patient is breathing better. Denies any fevers, chills, chest pain, nausea, vomiting, diarrhea, vomiting. No dysuria or hematuria. No headaches or visual disturbance.  Objective: Filed Vitals:   04/16/15 2124 04/17/15 0520 04/17/15 0816 04/17/15 1440  BP: 130/63 132/69  111/58  Pulse: 94 89  85  Temp: 98.4 F (36.9 C) 98.6 F (37 C)  98.7 F (37.1 C)  TempSrc: Oral Oral  Oral  Resp: 20 18  16   Height:      Weight:  101.152 kg (223 lb)    SpO2: 95% 98% 98% 93%    Intake/Output Summary (Last 24 hours) at 04/17/15 1802 Last data filed at 04/17/15 1533  Gross per 24 hour  Intake    560 ml  Output   1800 ml  Net  -1240 ml   Weight change: -1.452 kg (-3 lb 3.2 oz) Exam:   General:  Pt is alert, follows commands appropriately, not in acute distress  HEENT: No icterus, No thrush, No neck mass, [place]/AT  Cardiovascular: RRR, S1/S2, no rubs, no gallops  Respiratory: Fine bibasilar crackles. No wheezing  Abdomen: Soft/+BS, non tender, non distended, no guarding; no hepatosplenomegaly  Extremities: 1+LE edema, No lymphangitis, No petechiae, No rashes, no synovitis; no cyanosis or clubbing  Data Reviewed: Basic Metabolic Panel:  Recent Labs Lab 04/13/15 0420 04/14/15 0505 04/15/15 0430 04/16/15 0455 04/17/15 0431  NA 137 131* 133* 133* 133*  K 4.6 3.8 3.9 3.7 3.2*  CL 98* 95* 97* 94* 91*  CO2 30 26 27  32 34*  GLUCOSE 112* 214* 189* 212* 167*  BUN 26* 37* 29* 21* 18  CREATININE 1.77* 1.62* 1.18* 0.94  0.79  CALCIUM 8.7* 7.8* 8.0* 8.2* 8.3*   Liver Function Tests:  Recent Labs Lab 04/14/15 1400  AST 95*  ALT 22  ALKPHOS 91  BILITOT 0.7  PROT 6.9  ALBUMIN 3.4*    Recent Labs Lab 04/14/15 1400  LIPASE 12*   No results for input(s): AMMONIA in the last 168  hours. CBC:  Recent Labs Lab 04/12/15 1455 04/13/15 0420 04/15/15 0430  WBC 18.3* 13.7* 12.2*  NEUTROABS 17.1*  --   --   HGB 13.4 12.1 10.4*  HCT 41.4 37.1 32.4*  MCV 84.0 83.6 84.8  PLT 283 270 210   Cardiac Enzymes:  Recent Labs Lab 04/14/15 1400 04/14/15 1839 04/15/15 0040  TROPONINI <0.03 <0.03 <0.03   BNP: Invalid input(s): POCBNP CBG:  Recent Labs Lab 04/16/15 1656 04/16/15 2122 04/17/15 0758 04/17/15 1203 04/17/15 1732  GLUCAP 186* 222* 145* 156* 123*    Recent Results (from the past 240 hour(s))  Urine culture     Status: None   Collection Time: 04/14/15  3:04 PM  Result Value Ref Range Status   Specimen Description URINE, CLEAN CATCH  Final   Special Requests NONE  Final   Culture MULTIPLE SPECIES PRESENT, SUGGEST RECOLLECTION  Final   Report Status 04/16/2015 FINAL  Final     Scheduled Meds: . ARIPiprazole  5 mg Oral Daily  . buPROPion  300 mg Oral Daily  . FLUoxetine  40 mg Oral Daily  . [START ON 04/18/2015] furosemide  40 mg Oral Daily  . gabapentin  300 mg Oral TID  . heparin  5,000 Units Subcutaneous 3 times per day  . insulin aspart  0-15 Units Subcutaneous TID WC  . insulin aspart  0-5 Units Subcutaneous QHS  . insulin aspart  6 Units Subcutaneous TID WC  . insulin glargine  50 Units Subcutaneous QHS  . ipratropium-albuterol  3 mL Nebulization Q6H  . levothyroxine  50 mcg Oral QAC breakfast  . simvastatin  10 mg Oral Daily  . sodium chloride  3 mL Intravenous Q12H   Continuous Infusions:    Arshia Spellman, DO  [facility] Pager [phone]  If 7PM-7AM, please contact night-coverage [email or web address] Password [email or web address] 04/17/2015, 6:02 PM   LOS: 5 days

## 2015-04-17 NOTE — Progress Notes (Signed)
Pt states she does not want to be stuck again for a PIV. She requested that the nurse contact the MD to find an alternative way to give her medication, Delcine RN notified of pt's request. Consuello Masse

## 2015-04-17 NOTE — Progress Notes (Signed)
Physical Therapy Treatment Patient Details Name: [name] MRN: [id number] DOB: [birth year] Today's Date: 04/17/2015    History of Present Illness 54 y.o. female admitted to [facility] after a fall with R humerus fx and left patella fx both being treated conservatively at this time (no surgery).  Pt with significant PMHx of HTN, anemia, chronic back pain, neuropathy, depression, anxiety, panic disorder, MI, DM II, home O2 2 L PRN, bipolar d/o, bil foot surgery, back surgery, L shoulder surgery, L Patella fx surgery 1999, and ORIF L ankle fx.      PT Comments    Pt was able to progress her gait today into the hallway with one person hand held assist. She continues to be significantly imbalanced during gait, but her confidence is improving.  [name] a high fall risk and continues to benefit from SNF level of rehab before returning home with limited physical assistance. PT will continue to follow acutely.   Follow Up Recommendations  SNF;Supervision/Assistance - 24 hour     Equipment Recommendations  3in1 (PT)    Recommendations for Other Services   NA     Precautions / Restrictions Precautions Precautions: Fall Precaution Comments: h/o falls Required Braces or Orthoses: Knee Immobilizer - Left;Sling Knee Immobilizer - Left: On at all times Restrictions Weight Bearing Restrictions: No LLE Weight Bearing: Weight bearing as tolerated (in Knee immpbilizer) Other Position/Activity Restrictions: no flexion of left knee    Mobility  Bed Mobility Overal bed mobility: Needs Assistance Bed Mobility: Supine to Sit     Supine to sit: Min assist     General bed mobility comments: Min assist to support trunk to get to sitting as pt has difficulty doing this with only one arm.   Transfers Overall transfer level: Needs assistance Equipment used: 1 person hand held assist Transfers: Sit to/from Stand Sit to Stand: Min assist         General transfer comment: Min assist to support trunk  for balance during transitions.  Pt's shoes donned in bed to assist with stability when on her feet.   Ambulation/Gait Ambulation/Gait assistance: Mod assist Ambulation Distance (Feet): 65 Feet Assistive device: 1 person hand held assist Gait Pattern/deviations: Step-to pattern;Antalgic;Wide base of support Gait velocity: decreased Gait velocity interpretation: Below normal speed for age/gender General Gait Details: Pt with very wide BOS, stiff legged gait due to left knee immobilizer, staggering and heavy reliance on her left upper extremity for balance and support of her trunk during gait.  Pt with limited, but significantly improved gait distance this session.  Limited by lightheadedness.  Pt's O2 sats improved with gait to the mid to upper 90s on RA.           Balance Overall balance assessment: Needs assistance Sitting-balance support: Feet supported;Single extremity supported Sitting balance-Leahy Scale: Good     Standing balance support: Single extremity supported Standing balance-Leahy Scale: Poor                      Cognition Arousal/Alertness: Awake/alert Behavior During Therapy: WFL for tasks assessed/performed Overall Cognitive Status: Within Functional Limits for tasks assessed                             Pertinent Vitals/Pain Pain Assessment: 0-10 Pain Score: 6  Pain Location: right arm mostly per pt report Pain Descriptors / Indicators: Aching;Burning Pain Intervention(s): Limited activity within patient's tolerance;Monitored during session;Repositioned  PT Goals (current goals can now be found in the care plan section) Acute Rehab PT Goals Patient Stated Goal: to get better so she can eventually go home Progress towards PT goals: Progressing toward goals    Frequency  Min 3X/week    PT Plan Current plan remains appropriate       End of Session Equipment Utilized During Treatment: Left knee immobilizer;Other (comment)  (right arm sling, ) Activity Tolerance: Patient limited by fatigue;Patient limited by pain Patient left: in chair;with call bell/phone within reach     Time: 1232-1255 PT Time Calculation (min) (ACUTE ONLY): 23 min  Charges:  $Gait Training: 8-22 mins $Therapeutic Activity: 8-22 mins                      Normand Damron B. Tahjir Silveria, PT, DPT [phone]   04/17/2015, 10:31 PM

## 2015-04-17 NOTE — Progress Notes (Signed)
Central Telemetry monitor call to inform this RN at 1500 that pt. Had 6 beats of non-sustained SVT, as well as some vent. Bigeminy at 0919 this morning.  Text paged Dr. Arbutus Leas.  Will continue to monitor and await for response.  Forbes Cellar, RN

## 2015-04-18 LAB — BASIC METABOLIC PANEL
Anion gap: 12 (ref 5–15)
BUN: 23 mg/dL — ABNORMAL HIGH (ref 6–20)
CALCIUM: 8.5 mg/dL — AB (ref 8.9–10.3)
CO2: 34 mmol/L — ABNORMAL HIGH (ref 22–32)
CREATININE: 1.04 mg/dL — AB (ref 0.44–1.00)
Chloride: 91 mmol/L — ABNORMAL LOW (ref 101–111)
GFR calc Af Amer: >60 mL/min (ref >60)
GFR calc non Af Amer: 59 mL/min — ABNORMAL LOW (ref >60)
GLUCOSE: 167 mg/dL — AB (ref 65–99)
Potassium: 3.2 mmol/L — ABNORMAL LOW (ref 3.5–5.1)
Sodium: 137 mmol/L (ref 135–145)

## 2015-04-18 LAB — GLUCOSE, CAPILLARY
GLUCOSE-CAPILLARY: 144 mg/dL — AB (ref 65–99)
Glucose-Capillary: 154 mg/dL — ABNORMAL HIGH (ref 65–99)

## 2015-04-18 LAB — MAGNESIUM: MAGNESIUM: 1.9 mg/dL (ref 1.7–2.4)

## 2015-04-18 MED ORDER — POTASSIUM CHLORIDE CRYS ER 20 MEQ PO TBCR
40.0000 meq | EXTENDED_RELEASE_TABLET | ORAL | Status: AC
  Administered 2015-04-18 (×2): 40 meq via ORAL
  Filled 2015-04-18 (×2): qty 2

## 2015-04-18 MED ORDER — ALPRAZOLAM 0.5 MG PO TABS: 0.5000 mg | ORAL_TABLET | Freq: Two times a day (BID) | ORAL | Status: DC | PRN

## 2015-04-18 MED ORDER — INSULIN GLARGINE 100 UNIT/ML ~~LOC~~ SOLN: 52.0000 [IU] | Freq: Every day | SUBCUTANEOUS | Status: DC

## 2015-04-18 MED ORDER — FUROSEMIDE 40 MG PO TABS
40.0000 mg | ORAL_TABLET | Freq: Every day | ORAL | Status: DC
Start: 2015-04-18 — End: 2015-08-07

## 2015-04-18 MED ORDER — INSULIN ASPART 100 UNIT/ML ~~LOC~~ SOLN: 6.0000 [IU] | Freq: Three times a day (TID) | SUBCUTANEOUS | Status: DC

## 2015-04-18 MED ORDER — METOPROLOL TARTRATE 12.5 MG HALF TABLET
12.5000 mg | ORAL_TABLET | Freq: Two times a day (BID) | ORAL | Status: DC
  Administered 2015-04-18: 12.5 mg via ORAL
  Filled 2015-04-18: qty 1

## 2015-04-18 MED ORDER — POTASSIUM CHLORIDE CRYS ER 20 MEQ PO TBCR: 20.0000 meq | EXTENDED_RELEASE_TABLET | Freq: Every day | ORAL | Status: DC

## 2015-04-18 MED ORDER — METOPROLOL TARTRATE 25 MG PO TABS: 12.5000 mg | ORAL_TABLET | Freq: Two times a day (BID) | ORAL | Status: AC

## 2015-04-18 MED ORDER — OXYCODONE-ACETAMINOPHEN 5-325 MG PO TABS: 2.0000 | ORAL_TABLET | ORAL | Status: DC | PRN

## 2015-04-18 NOTE — Progress Notes (Signed)
Utilization Review completed. Ciella Obi RN BSN CM 

## 2015-04-18 NOTE — Progress Notes (Signed)
OT Cancellation Note  Patient Details Name: [name] MRN: [id number] DOB: 30-Apr-1961   Cancelled Treatment:    Reason Eval/Treat Not Completed: Pain limiting ability to participate (Pt not feeling well this morning per her report with increased pain in the right arm.  She states she just got comfortable and did not want to get up at this time. ) May check back later today if time permits.  Joanna Hall OTR/L 04/18/2015, 8:51 AM

## 2015-04-18 NOTE — Clinical Social Work Placement (Signed)
   CLINICAL SOCIAL WORK PLACEMENT  NOTE  Date:  04/18/2015  Patient Details  Name: [name] MRN: [id number] Date of Birth: [birth year]  Clinical Social Work is seeking post-discharge placement for this patient at the Skilled  Nursing Facility level of care (*CSW will initial, date and re-position this form in  chart as items are completed):  Yes   Patient/family provided with [place] Clinical Social Work Department's list of facilities offering this level of care within the geographic area requested by the patient (or if unable, by the patient's family).  Yes   Patient/family informed of their freedom to choose among providers that offer the needed level of care, that participate in Medicare, Medicaid or managed care program needed by the patient, have an available bed and are willing to accept the patient.  Yes   Patient/family informed of [place]'s ownership interest in [facility] and [facility], as well as of the fact that they are under no obligation to receive care at these facilities.  PASRR submitted to EDS on 04/13/15     PASRR number received on 04/17/15     Existing PASRR number confirmed on       FL2 transmitted to all facilities in geographic area requested by pt/family on 04/13/15     FL2 transmitted to all facilities within larger geographic area on       Patient informed that his/her managed care company has contracts with or will negotiate with certain facilities, including the following:        Yes   Patient/family informed of bed offers received.  Patient chooses bed at [facility]     Physician recommends and patient chooses bed at      Patient to be transferred to [facility] on 04/18/15.  Patient to be transferred to facility by Ambulance     Patient family notified on 04/18/15 of transfer.  Name of family member notified:  Patient notifying her family.      PHYSICIAN       Additional  Comment:   Per MD patient ready for DC to [facility]. RN, patient, patient's family, and facility notified of DC. RN given number for report. DC packet on chart. Ambulance transport requested for patient. CSW signing off.  _______________________________________________ Roddie Mc MSW, [place], [place], [phone]

## 2015-04-18 NOTE — Discharge Summary (Signed)
Physician Discharge Summary  SHAMINA ETHERIDGE ZOX:[id number] DOB: 1961/06/21 DOA: 04/12/2015  PCP: [name], MD  Admit date: 04/12/2015 Discharge date: 04/18/2015  Recommendations for Outpatient Follow-up:  1. Pt will need to follow up with PCP in 1 week post discharge 2. Please obtain BMP one week 3. Orthopedics, Dr. Jones Broom, in 1 week--[phone]  Discharge Diagnoses:  Acute on chronic respiratory failure -pulm edema in setting of underlying COPD, OHS -Patient was only intermittently using oxygen prior to admission (2 L) -7/22-Chest x-ray--pulm vasc congestion -BNP--105 -LE duplex--neg -D-Dimer--elevated but low clinical suspicion for VTE -04/16/2015--repeat chest x-ray as patient continues to complain of shortness of breath that has not much improved -continue duonebs Acute diastolic CHF -appreciate cardiology evaluation-->no acute intervention for mitral stenosis at this time -likely R-side Heart Failure -continue IV lasix--discharged with furosemide 40 mg daily -Discharged with potassium chloride 20 mEq daily -Discharged with metoprolol tartrate 12.5 mg twice a day -Please recheck BMP within 1 week -I/O--neg approx 4.2L -daily weights--question accuracy -Echocardiogram EF 65-70 percent, grade 1 diastolic dysfunction Acute kidney injury -improved with diuresis -UA--no pyuria or proteinuria -Serum creatinine 1.04 date of discharge Right comminuted proximal humerus fracture -Case was discussed with orthopedics, Dr. Ave Filter -minimize activity with R-arm, but pt is able to use hand as tolerated -PT/OT-->SNF -remain in splint/sling Left nondisplaced patellar fracture -Weightbearing as tolerated--remain in the immobilizer which time the patient bears weight  -PT/OT-->SNF -Pain control N/V -likely related to opioids -LFTs and lipase--within normal limits -improved, tolerating diet Atypical chest pain -cycle troponins--neg x 3 -EKG without concerning  ischemic changes Diabetes mellitus type 2 -Discontinue Invokana, Amaryl for now -Restart Invokana after d/c -f/u PCP after d/c -Increase Lantus to 52 units  -novolog SSI -Continue novolog 6 units with meals -HbA1C--8.8 Hypertension -The patient had relative hypotension in the emergency department after intravenous fluids -As result, anti-hypertensive agents were held -Blood pressure remains stable off of antihypertensives meds -continue to monitor -start low dose BB in setting of CHF--metoprolol tartrate 12.5 mg twice a day  COPD with ongoing tobacco abuse -Nicotine patch ordered. Patient is stable and not wheezing. Continue albuterol inhaler when necessary. History of coronary artery disease, mitral stenosis, MI -Echo--EF 65-70 percent, grade 1 diastolic dysfunction, no WMA -ASA 81 mg daily General anxiety disorder with panic disorder and Agoraphobia -Restart Xanax, Prozac, Wellbutrin, Abilify -Ativan when necessary IV.  Discharge Condition: stable  Disposition: SNF Follow-up Information    Schedule an appointment as soon as possible for a visit with Cammy Copa, MD.   Specialty:  Orthopedic Surgery   Contact information:   [address] [place] [address] [phone]       Follow up with Mable Paris, MD. Schedule an appointment as soon as possible for a visit in 6 days.   Specialty:  Orthopedic Surgery   Why:  Patient must call the office at the number [phone] and resolve issues that may be hindering the patient from having an APPT scheduled. NS alert RN, RN [email or web address] at 04/13/15 @ 13:11.   Contact information:   [address] [place] [address] [phone]       Follow up with Fleet Contras A, MD In 1 week.   Specialty:  Internal Medicine   Contact information:   [address] [place] [address] [phone]       Diet:carb modified Wt Readings from Last 3 Encounters:  04/18/15 101.8 kg (224 lb  6.9 oz)  11/15/14 97.977 kg (216 lb)  10/06/13 97.523  kg (215 lb)    History of present illness:  54 year old female with a history of diabetes mellitus, coronary artery disease, hyperlipidemia, hypothyroidism, and anxiety presented to emergency department after mechanical fall and she tripped on the edge of the sidewalk. Radiographs in the emergency department revealed a right humerus fracture and left patella fracture. Orthopedics was consulted. They did not feel the patient required surgical intervention. The physical therapy and occupation therapy were ordered. Patient denied any presyncopal symptoms, chest discomfort, shortness breath. However, on 04/14/2015, the patient had increasing shortness of breath. Workup revealed pulmonary edema. The patient was started on intravenous furosemide with good clinical response. The patient was seen by cardiology for her severe mitral stenosis. Cardiology did not feel that the patient needed any acute interventions at this time. They recommended follow-up in the office after discharge.  Consultants: Cardiology  Discharge Exam: Filed Vitals:   04/18/15 0518  BP: 128/57  Pulse: 82  Temp: 97.8 F (36.6 C)  Resp: 18   Filed Vitals:   04/17/15 2145 04/18/15 0116 04/18/15 0500 04/18/15 0518  BP: 113/49   128/57  Pulse: 85 76  82  Temp: 98.3 F (36.8 C)   97.8 F (36.6 C)  TempSrc: Oral   Oral  Resp: 18 16  18   Height:      Weight:   102.014 kg (224 lb 14.4 oz) 101.8 kg (224 lb 6.9 oz)  SpO2: 94%   92%   General: A&O x 3, NAD, pleasant, cooperative Cardiovascular: RRR, no rub, no gallop, no S3 Respiratory: CTAB, no wheeze, no rhonchi Abdomen:soft, nontender, nondistended, positive bowel sounds Extremities: trace LE edema, No lymphangitis, no petechiae  Discharge Instructions      Discharge Instructions    Diet - low sodium heart healthy    Complete by:  As directed      Diet - low sodium heart healthy    Complete by:  As directed       Increase activity slowly    Complete by:  As directed      Increase activity slowly    Complete by:  As directed             Medication List    STOP taking these medications        glimepiride 4 MG tablet  Commonly known as:  AMARYL     hydrochlorothiazide 25 MG tablet  Commonly known as:  HYDRODIURIL     losartan 100 MG tablet  Commonly known as:  COZAAR     meloxicam 7.5 MG tablet  Commonly known as:  MOBIC      TAKE these medications        acetaminophen 500 MG tablet  Commonly known as:  TYLENOL  Take 1 tablet (500 mg total) by mouth every 6 (six) hours as needed.     ALPRAZolam 0.5 MG tablet  Commonly known as:  XANAX  Take 1 tablet (0.5 mg total) by mouth 2 (two) times daily as needed for anxiety.     ARIPiprazole 5 MG tablet  Commonly known as:  ABILIFY  Take 5 mg by mouth daily.     buPROPion 300 MG 24 hr tablet  Commonly known as:  WELLBUTRIN XL  Take 300 mg by mouth daily.     cetirizine 10 MG tablet  Commonly known as:  ZYRTEC  Take 10 mg by mouth daily as needed for allergies.     FLUoxetine 40 MG capsule  Commonly known as:  PROZAC  Take 40 mg by mouth daily.     furosemide 40 MG tablet  Commonly known as:  LASIX  Take 1 tablet (40 mg total) by mouth daily.     gabapentin 300 MG capsule  Commonly known as:  NEURONTIN  Take 300 mg by mouth 3 (three) times daily.     HYDROcodone-acetaminophen 5-325 MG per tablet  Commonly known as:  NORCO/VICODIN  Take 1 tablet by mouth every 4 (four) hours as needed.     insulin aspart 100 UNIT/ML injection  Commonly known as:  novoLOG  Inject 6 Units into the skin 3 (three) times daily with meals.     insulin glargine 100 UNIT/ML injection  Commonly known as:  LANTUS  Inject 0.52 mLs (52 Units total) into the skin at bedtime.     INVOKANA 100 MG Tabs tablet  Generic drug:  canagliflozin  Take 100 mg by mouth daily.     levothyroxine 50 MCG tablet  Commonly known as:  SYNTHROID, LEVOTHROID  Take  50 mcg by mouth daily before breakfast.     methocarbamol 500 MG tablet  Commonly known as:  ROBAXIN  Take 500 mg by mouth 2 (two) times daily as needed for muscle spasms.     metoprolol tartrate 25 MG tablet  Commonly known as:  LOPRESSOR  Take 0.5 tablets (12.5 mg total) by mouth 2 (two) times daily.     omeprazole 20 MG capsule  Commonly known as:  PRILOSEC  Take 20 mg by mouth daily.     oxyCODONE-acetaminophen 5-325 MG per tablet  Commonly known as:  PERCOCET/ROXICET  Take 2 tablets by mouth every 4 (four) hours as needed for severe pain.     potassium chloride SA 20 MEQ tablet  Commonly known as:  K-DUR,KLOR-CON  Take 1 tablet (20 mEq total) by mouth daily.     PROAIR HFA 108 (90 BASE) MCG/ACT inhaler  Generic drug:  albuterol  Inhale 1 puff into the lungs 2 (two) times daily as needed for wheezing or shortness of breath.     simvastatin 10 MG tablet  Commonly known as:  ZOCOR  Take 10 mg by mouth daily.         The results of significant diagnostics from this hospitalization (including imaging, microbiology, ancillary and laboratory) are listed below for reference.    Significant Diagnostic Studies: Dg Shoulder Right  04/12/2015   CLINICAL DATA:  54 year old female with a history fall.  EXAM: RIGHT SHOULDER - 2+ VIEW  COMPARISON:  01/04/2014  FINDINGS: New acute comminuted fracture of the proximal humerus, with the fracture line involving the humeral head. Trans axillary image demonstrates approximately 27 degrees of angulation at the fracture site.  IMPRESSION: Acute comminuted fracture of the proximal right humerus with the fracture line extending to involve the humeral head. There is approximately 27 degrees of angulation on the trans axillary image.  Signed,  [name]. [name], DO  [facility]  [facility]   Electronically Signed   By: Gilmer Mor D.O.   On: 04/12/2015 11:07   Dg Chest Port 1 View  04/16/2015    CLINICAL DATA:  Dyspnea, history of congestive heart failure  EXAM: PORTABLE CHEST - 1 VIEW  COMPARISON:  04/14/2015  FINDINGS: Moderate cardiac enlargement stable. Central vascular congestion similar to prior study. Mild interstitial prominence also unchanged primarily at the lung bases. This appears to represent a combination of subsegmental atelectasis and likely mild interstitial edema.  IMPRESSION: Congestive heart  failure with mild interstitial edema similar to prior study   Electronically Signed   By: Esperanza Heir M.D.   On: 04/16/2015 16:36   Dg Chest Port 1 View  04/14/2015   CLINICAL DATA:  Dyspnea and hypoxemia.  EXAM: PORTABLE CHEST - 1 VIEW  COMPARISON:  01/04/2014  FINDINGS: Cardiac silhouette remains enlarged, unchanged. There is mild pulmonary vascular congestion and indistinctness with mild interstitial opacities in both lung bases. No pleural effusion or pneumothorax is identified. Prior cervical fusion and left shoulder arthroplasty are noted. Comminuted right humerus fracture is partially visualized as seen on 04/12/2015 shoulder radiographs.  IMPRESSION: Cardiomegaly and mild interstitial edema.   Electronically Signed   By: Sebastian Ache   On: 04/14/2015 13:27   Dg Knee Complete 4 Views Left  04/12/2015   CLINICAL DATA:  Larey Seat last week landing onto left knee. Patient describes left knee pain medially with swelling and difficulty bearing weight.  EXAM: LEFT KNEE - COMPLETE 4+ VIEW  COMPARISON:  None available.  FINDINGS: Patient is status post total left knee arthroplasty. Hardware appears intact and well positioned. There is a fracture deformity of the superior patella, as seen on the cross-table lateral view, of uncertain age but possibly acute.  IMPRESSION: Slightly displaced/comminuted fracture deformity of the superior left patella. This is of uncertain age but could certainly be acute. Is patient symptomatic in this area of the patellar?  Status post total left knee arthroplasty.  Hardware appears intact and well positioned. No acute fracture or osseous dislocation in the regions of the distal left femur or proximal tib-fib.   Electronically Signed   By: Bary Richard M.D.   On: 04/12/2015 14:03     Microbiology: Recent Results (from the past 240 hour(s))  Urine culture     Status: None   Collection Time: 04/14/15  3:04 PM  Result Value Ref Range Status   Specimen Description URINE, CLEAN CATCH  Final   Special Requests NONE  Final   Culture MULTIPLE SPECIES PRESENT, SUGGEST RECOLLECTION  Final   Report Status 04/16/2015 FINAL  Final     Labs: Basic Metabolic Panel:  Recent Labs Lab 04/14/15 0505 04/15/15 0430 04/16/15 0455 04/17/15 0431 04/18/15 0525  NA 131* 133* 133* 133* 137  K 3.8 3.9 3.7 3.2* 3.2*  CL 95* 97* 94* 91* 91*  CO2 26 27 32 34* 34*  GLUCOSE 214* 189* 212* 167* 167*  BUN 37* 29* 21* 18 23*  CREATININE 1.62* 1.18* 0.94 0.79 1.04*  CALCIUM 7.8* 8.0* 8.2* 8.3* 8.5*  MG  --   --   --   --  1.9   Liver Function Tests:  Recent Labs Lab 04/14/15 1400  AST 95*  ALT 22  ALKPHOS 91  BILITOT 0.7  PROT 6.9  ALBUMIN 3.4*    Recent Labs Lab 04/14/15 1400  LIPASE 12*   No results for input(s): AMMONIA in the last 168 hours. CBC:  Recent Labs Lab 04/12/15 1455 04/13/15 0420 04/15/15 0430  WBC 18.3* 13.7* 12.2*  NEUTROABS 17.1*  --   --   HGB 13.4 12.1 10.4*  HCT 41.4 37.1 32.4*  MCV 84.0 83.6 84.8  PLT 283 270 210   Cardiac Enzymes:  Recent Labs Lab 04/14/15 1400 04/14/15 1839 04/15/15 0040  TROPONINI <0.03 <0.03 <0.03   BNP: Invalid input(s): POCBNP CBG:  Recent Labs Lab 04/17/15 0758 04/17/15 1203 04/17/15 1732 04/17/15 2144 04/18/15 0810  GLUCAP 145* 156* 123* 149* 144*    Time coordinating  discharge:  Greater than 30 minutes  Signed:  Carolle Ishii, DO [facility] Pager: [phone] 04/18/2015, 8:31 AM

## 2015-04-18 NOTE — Progress Notes (Signed)
[name] discharged Skilled nursing facility per MD order.  Report called to receiving Pricilla Riffle, LPN at [facility].     Medication List    STOP taking these medications        glimepiride 4 MG tablet  Commonly known as:  AMARYL     hydrochlorothiazide 25 MG tablet  Commonly known as:  HYDRODIURIL     losartan 100 MG tablet  Commonly known as:  COZAAR     meloxicam 7.5 MG tablet  Commonly known as:  MOBIC      TAKE these medications        acetaminophen 500 MG tablet  Commonly known as:  TYLENOL  Take 1 tablet (500 mg total) by mouth every 6 (six) hours as needed.     ALPRAZolam 0.5 MG tablet  Commonly known as:  XANAX  Take 1 tablet (0.5 mg total) by mouth 2 (two) times daily as needed for anxiety.     ARIPiprazole 5 MG tablet  Commonly known as:  ABILIFY  Take 5 mg by mouth daily.     buPROPion 300 MG 24 hr tablet  Commonly known as:  WELLBUTRIN XL  Take 300 mg by mouth daily.     cetirizine 10 MG tablet  Commonly known as:  ZYRTEC  Take 10 mg by mouth daily as needed for allergies.     FLUoxetine 40 MG capsule  Commonly known as:  PROZAC  Take 40 mg by mouth daily.     furosemide 40 MG tablet  Commonly known as:  LASIX  Take 1 tablet (40 mg total) by mouth daily.     gabapentin 300 MG capsule  Commonly known as:  NEURONTIN  Take 300 mg by mouth 3 (three) times daily.     HYDROcodone-acetaminophen 5-325 MG per tablet  Commonly known as:  NORCO/VICODIN  Take 1 tablet by mouth every 4 (four) hours as needed.     insulin aspart 100 UNIT/ML injection  Commonly known as:  novoLOG  Inject 6 Units into the skin 3 (three) times daily with meals.     insulin glargine 100 UNIT/ML injection  Commonly known as:  LANTUS  Inject 0.52 mLs (52 Units total) into the skin at bedtime.     INVOKANA 100 MG Tabs tablet  Generic drug:  canagliflozin  Take 100 mg by mouth daily.     levothyroxine 50 MCG tablet  Commonly known as:   SYNTHROID, LEVOTHROID  Take 50 mcg by mouth daily before breakfast.     methocarbamol 500 MG tablet  Commonly known as:  ROBAXIN  Take 500 mg by mouth 2 (two) times daily as needed for muscle spasms.     metoprolol tartrate 25 MG tablet  Commonly known as:  LOPRESSOR  Take 0.5 tablets (12.5 mg total) by mouth 2 (two) times daily.     omeprazole 20 MG capsule  Commonly known as:  PRILOSEC  Take 20 mg by mouth daily.     oxyCODONE-acetaminophen 5-325 MG per tablet  Commonly known as:  PERCOCET/ROXICET  Take 2 tablets by mouth every 4 (four) hours as needed for severe pain.     potassium chloride SA 20 MEQ tablet  Commonly known as:  K-DUR,KLOR-CON  Take 1 tablet (20 mEq total) by mouth daily.     PROAIR HFA 108 (90 BASE) MCG/ACT inhaler  Generic drug:  albuterol  Inhale 1 puff into the lungs 2 (two) times daily as needed for wheezing or  shortness of breath.     simvastatin 10 MG tablet  Commonly known as:  ZOCOR  Take 10 mg by mouth daily.        Patients skin is clean, dry and intact, no evidence of skin break down, just bruises to left arm & abd..   Patient transported on a stretcher by non emergent EMS,  no distress noted upon discharge.  [name], [name] C 04/18/2015 3:32 PM

## 2015-04-18 NOTE — Care Management Note (Signed)
Case Management Note  Patient Details  Name: [name] MRN: [id number] Date of Birth: 1960-09-26  Subjective/Objective:                 Patient to discharge today to SNF, arrangements being completed through SW.    Action/Plan:  Discharge to SNF Expected Discharge Date:                  Expected Discharge Plan:  Skilled Nursing Facility  In-House Referral:  Clinical Social Work  Discharge planning Services  CM Consult  Post Acute Care Choice:    Choice offered to:  Patient  DME Arranged:    DME Agency:     HH Arranged:    HH Agency:     Status of Service:  Completed, signed off  Medicare Important Message Given:    Date Medicare IM Given:    Medicare IM give by:    Date Additional Medicare IM Given:    Additional Medicare Important Message give by:     If discussed at Long Length of Stay Meetings, dates discussed:    Additional Comments:  Lawerance Sabal, RN 04/18/2015, 12:08 PM

## 2015-04-19 ENCOUNTER — Other Ambulatory Visit: Payer: Self-pay

## 2015-04-19 MED ORDER — HYDROCODONE-ACETAMINOPHEN 5-325 MG PO TABS: ORAL_TABLET | ORAL | Status: DC

## 2015-04-19 NOTE — Telephone Encounter (Signed)
RX faxed to [facility] @ [phone], phone number [phone] 

## 2015-04-21 ENCOUNTER — Non-Acute Institutional Stay (SKILLED_NURSING_FACILITY): Payer: Medicaid Other | Admitting: Adult Health

## 2015-04-21 ENCOUNTER — Encounter: Payer: Self-pay | Admitting: Adult Health

## 2015-04-21 DIAGNOSIS — S42301S Unspecified fracture of shaft of humerus, right arm, sequela: Secondary | ICD-10-CM

## 2015-04-21 DIAGNOSIS — J439 Emphysema, unspecified: Secondary | ICD-10-CM

## 2015-04-21 DIAGNOSIS — F329 Major depressive disorder, single episode, unspecified: Secondary | ICD-10-CM

## 2015-04-21 DIAGNOSIS — W19XXXS Unspecified fall, sequela: Secondary | ICD-10-CM | POA: Diagnosis not present

## 2015-04-21 DIAGNOSIS — F411 Generalized anxiety disorder: Secondary | ICD-10-CM | POA: Diagnosis not present

## 2015-04-21 DIAGNOSIS — E1165 Type 2 diabetes mellitus with hyperglycemia: Secondary | ICD-10-CM | POA: Diagnosis not present

## 2015-04-21 DIAGNOSIS — F32A Depression, unspecified: Secondary | ICD-10-CM

## 2015-04-21 DIAGNOSIS — R5381 Other malaise: Secondary | ICD-10-CM | POA: Diagnosis not present

## 2015-04-21 DIAGNOSIS — S82002D Unspecified fracture of left patella, subsequent encounter for closed fracture with routine healing: Secondary | ICD-10-CM

## 2015-04-21 DIAGNOSIS — I5031 Acute diastolic (congestive) heart failure: Secondary | ICD-10-CM | POA: Diagnosis not present

## 2015-04-21 DIAGNOSIS — K219 Gastro-esophageal reflux disease without esophagitis: Secondary | ICD-10-CM

## 2015-04-21 DIAGNOSIS — E039 Hypothyroidism, unspecified: Secondary | ICD-10-CM
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
## 2015-04-25 ENCOUNTER — Non-Acute Institutional Stay (SKILLED_NURSING_FACILITY): Payer: Medicaid Other | Admitting: Internal Medicine

## 2015-04-25 ENCOUNTER — Encounter: Payer: Self-pay | Admitting: Internal Medicine

## 2015-04-25 DIAGNOSIS — F411 Generalized anxiety disorder: Secondary | ICD-10-CM | POA: Diagnosis not present

## 2015-04-25 DIAGNOSIS — S42301S Unspecified fracture of shaft of humerus, right arm, sequela: Secondary | ICD-10-CM

## 2015-04-25 DIAGNOSIS — F319 Bipolar disorder, unspecified: Secondary | ICD-10-CM | POA: Diagnosis not present

## 2015-04-25 DIAGNOSIS — E1165 Type 2 diabetes mellitus with hyperglycemia: Secondary | ICD-10-CM

## 2015-04-25 DIAGNOSIS — S82002D Unspecified fracture of left patella, subsequent encounter for closed fracture with routine healing: Secondary | ICD-10-CM | POA: Diagnosis not present

## 2015-04-25 DIAGNOSIS — E039 Hypothyroidism, unspecified: Secondary | ICD-10-CM

## 2015-04-25 DIAGNOSIS — J439 Emphysema, unspecified: Secondary | ICD-10-CM | POA: Diagnosis not present
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
## 2015-06-02 ENCOUNTER — Non-Acute Institutional Stay (SKILLED_NURSING_FACILITY): Payer: Medicaid Other | Admitting: Adult Health

## 2015-06-02 DIAGNOSIS — S42301K Unspecified fracture of shaft of humerus, right arm, subsequent encounter for fracture with nonunion: Secondary | ICD-10-CM

## 2015-06-02 DIAGNOSIS — E1143 Type 2 diabetes mellitus with diabetic autonomic (poly)neuropathy: Secondary | ICD-10-CM

## 2015-06-02 DIAGNOSIS — Z794 Long term (current) use of insulin: Secondary | ICD-10-CM | POA: Diagnosis not present

## 2015-06-02 DIAGNOSIS — S82002D Unspecified fracture of left patella, subsequent encounter for closed fracture with routine healing: Secondary | ICD-10-CM

## 2015-06-02 DIAGNOSIS — J439 Emphysema, unspecified: Secondary | ICD-10-CM

## 2015-06-02 DIAGNOSIS — I5031 Acute diastolic (congestive) heart failure: Secondary | ICD-10-CM

## 2015-06-02 DIAGNOSIS — I10 Essential (primary) hypertension: Secondary | ICD-10-CM

## 2015-06-13 ENCOUNTER — Non-Acute Institutional Stay (SKILLED_NURSING_FACILITY): Payer: Medicaid Other | Admitting: Internal Medicine

## 2015-06-13 DIAGNOSIS — R600 Localized edema: Secondary | ICD-10-CM

## 2015-06-13 DIAGNOSIS — F411 Generalized anxiety disorder: Secondary | ICD-10-CM

## 2015-06-13 DIAGNOSIS — I5032 Chronic diastolic (congestive) heart failure: Secondary | ICD-10-CM | POA: Diagnosis not present

## 2015-06-13 DIAGNOSIS — S82002D Unspecified fracture of left patella, subsequent encounter for closed fracture with routine healing: Secondary | ICD-10-CM

## 2015-06-13 DIAGNOSIS — S42301K Unspecified fracture of shaft of humerus, right arm, subsequent encounter for fracture with nonunion: Secondary | ICD-10-CM

## 2015-06-13 DIAGNOSIS — I10 Essential (primary) hypertension: Secondary | ICD-10-CM

## 2015-06-13 DIAGNOSIS — E1143 Type 2 diabetes mellitus with diabetic autonomic (poly)neuropathy: Secondary | ICD-10-CM

## 2015-06-13 DIAGNOSIS — Z794 Long term (current) use of insulin: Secondary | ICD-10-CM

## 2015-06-13 DIAGNOSIS — J439 Emphysema, unspecified: Secondary | ICD-10-CM

## 2015-06-29 ENCOUNTER — Encounter: Payer: Self-pay | Admitting: Internal Medicine

## 2015-07-07 ENCOUNTER — Encounter: Payer: Self-pay | Admitting: Cardiology

## 2015-07-07 ENCOUNTER — Ambulatory Visit (INDEPENDENT_AMBULATORY_CARE_PROVIDER_SITE_OTHER): Payer: Medicaid Other | Admitting: Cardiology

## 2015-07-07 VITALS — BP 120/58 | HR 63 | Ht <= 58 in | Wt 205.0 lb

## 2015-07-07 DIAGNOSIS — R0600 Dyspnea, unspecified: Secondary | ICD-10-CM

## 2015-07-07 DIAGNOSIS — J438 Other emphysema: Secondary | ICD-10-CM | POA: Diagnosis not present

## 2015-07-07 DIAGNOSIS — I050 Rheumatic mitral stenosis: Secondary | ICD-10-CM | POA: Diagnosis not present
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
## 2015-07-07 NOTE — Patient Instructions (Addendum)
Medication Instructions:  Your physician recommends that you continue on your current medications as directed. Please refer to the Current Medication list given to you today.  Labwork: NONE  Testing/Procedures: NONE  Follow-Up: Your physician recommends that you schedule a follow-up appointment in: 6 month ov/EKG with Dawayne PatriciaLori G NP or Bing NeighborsScott W PA

## 2015-07-17 ENCOUNTER — Ambulatory Visit (INDEPENDENT_AMBULATORY_CARE_PROVIDER_SITE_OTHER): Payer: Medicaid Other | Admitting: Podiatry

## 2015-07-17 DIAGNOSIS — E118 Type 2 diabetes mellitus with unspecified complications: Secondary | ICD-10-CM

## 2015-07-17 DIAGNOSIS — Z794 Long term (current) use of insulin: Secondary | ICD-10-CM

## 2015-07-17 DIAGNOSIS — Q828 Other specified congenital malformations of skin: Secondary | ICD-10-CM

## 2015-07-17 DIAGNOSIS — B351 Tinea unguium: Secondary | ICD-10-CM | POA: Diagnosis not present

## 2015-07-17 DIAGNOSIS — M79673 Pain in unspecified foot: Secondary | ICD-10-CM

## 2015-07-17 NOTE — Progress Notes (Signed)
HPI Presents today chief complaint of painful elongated toenails.  Objective: Pulses are palpable bilateral nails are thick, yellow dystrophic onychomycosis and painful palpation.   Assessment: Onychomycosis with pain in limb.  Plan: Treatment of nails in thickness and length as covered service secondary to pain.  

## 2015-07-19 ENCOUNTER — Ambulatory Visit: Payer: Medicaid Other | Admitting: Podiatry

## 2015-07-25 ENCOUNTER — Encounter: Payer: Self-pay | Admitting: Adult Health
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
## 2015-07-29 ENCOUNTER — Emergency Department (HOSPITAL_COMMUNITY): Payer: Medicaid Other

## 2015-07-29 ENCOUNTER — Observation Stay (HOSPITAL_COMMUNITY)
Admission: EM | Admit: 2015-07-29 | Discharge: 2015-07-31 | Disposition: A | Discharge status: Home / Self Care | Payer: Medicaid Other | Attending: Internal Medicine | Admitting: Internal Medicine

## 2015-07-29 ENCOUNTER — Encounter (HOSPITAL_COMMUNITY): Payer: Self-pay

## 2015-07-29 DIAGNOSIS — I10 Essential (primary) hypertension: Secondary | ICD-10-CM | POA: Diagnosis not present

## 2015-07-29 DIAGNOSIS — I252 Old myocardial infarction: Secondary | ICD-10-CM | POA: Insufficient documentation

## 2015-07-29 DIAGNOSIS — I5033 Acute on chronic diastolic (congestive) heart failure: Secondary | ICD-10-CM | POA: Diagnosis not present

## 2015-07-29 DIAGNOSIS — Z23 Encounter for immunization: Secondary | ICD-10-CM | POA: Diagnosis not present

## 2015-07-29 DIAGNOSIS — F329 Major depressive disorder, single episode, unspecified: Secondary | ICD-10-CM | POA: Diagnosis not present

## 2015-07-29 DIAGNOSIS — I050 Rheumatic mitral stenosis: Secondary | ICD-10-CM | POA: Insufficient documentation

## 2015-07-29 DIAGNOSIS — R0789 Other chest pain: Secondary | ICD-10-CM | POA: Diagnosis not present

## 2015-07-29 DIAGNOSIS — J449 Chronic obstructive pulmonary disease, unspecified: Secondary | ICD-10-CM | POA: Insufficient documentation

## 2015-07-29 DIAGNOSIS — F4000 Agoraphobia, unspecified: Secondary | ICD-10-CM | POA: Diagnosis not present

## 2015-07-29 DIAGNOSIS — J439 Emphysema, unspecified: Secondary | ICD-10-CM | POA: Diagnosis not present

## 2015-07-29 DIAGNOSIS — J9621 Acute and chronic respiratory failure with hypoxia: Secondary | ICD-10-CM

## 2015-07-29 DIAGNOSIS — E1165 Type 2 diabetes mellitus with hyperglycemia: Secondary | ICD-10-CM | POA: Diagnosis not present

## 2015-07-29 DIAGNOSIS — K219 Gastro-esophageal reflux disease without esophagitis: Secondary | ICD-10-CM

## 2015-07-29 DIAGNOSIS — Z7982 Long term (current) use of aspirin: Secondary | ICD-10-CM | POA: Insufficient documentation

## 2015-07-29 DIAGNOSIS — Z9981 Dependence on supplemental oxygen: Secondary | ICD-10-CM | POA: Insufficient documentation

## 2015-07-29 DIAGNOSIS — Z79899 Other long term (current) drug therapy: Secondary | ICD-10-CM | POA: Diagnosis not present

## 2015-07-29 DIAGNOSIS — F319 Bipolar disorder, unspecified: Secondary | ICD-10-CM | POA: Insufficient documentation

## 2015-07-29 DIAGNOSIS — E876 Hypokalemia: Secondary | ICD-10-CM | POA: Insufficient documentation

## 2015-07-29 DIAGNOSIS — Z794 Long term (current) use of insulin: Secondary | ICD-10-CM | POA: Diagnosis not present

## 2015-07-29 DIAGNOSIS — S4291XA Fracture of right shoulder girdle, part unspecified, initial encounter for closed fracture: Secondary | ICD-10-CM | POA: Diagnosis present

## 2015-07-29 DIAGNOSIS — R079 Chest pain, unspecified: Secondary | ICD-10-CM | POA: Diagnosis not present

## 2015-07-29 DIAGNOSIS — J9620 Acute and chronic respiratory failure, unspecified whether with hypoxia or hypercapnia: Secondary | ICD-10-CM | POA: Diagnosis not present

## 2015-07-29 DIAGNOSIS — I208 Other forms of angina pectoris: Secondary | ICD-10-CM | POA: Diagnosis present

## 2015-07-29 DIAGNOSIS — F411 Generalized anxiety disorder: Secondary | ICD-10-CM

## 2015-07-29 DIAGNOSIS — E039 Hypothyroidism, unspecified: Secondary | ICD-10-CM | POA: Diagnosis not present

## 2015-07-29 DIAGNOSIS — Z87891 Personal history of nicotine dependence: Secondary | ICD-10-CM | POA: Insufficient documentation

## 2015-07-29 DIAGNOSIS — F32A Depression, unspecified: Secondary | ICD-10-CM | POA: Diagnosis present

## 2015-07-29 LAB — BASIC METABOLIC PANEL
ANION GAP: 8 (ref 5–15)
BUN: 16 mg/dL (ref 6–20)
CALCIUM: 9 mg/dL (ref 8.9–10.3)
CO2: 30 mmol/L (ref 22–32)
Chloride: 102 mmol/L (ref 101–111)
Creatinine, Ser: 1.04 mg/dL — ABNORMAL HIGH (ref 0.44–1.00)
GFR calc Af Amer: >60 mL/min (ref >60)
GFR calc non Af Amer: 59 mL/min — ABNORMAL LOW (ref >60)
Glucose, Bld: 158 mg/dL — ABNORMAL HIGH (ref 65–99)
POTASSIUM: 3.5 mmol/L (ref 3.5–5.1)
Sodium: 140 mmol/L (ref 135–145)

## 2015-07-29 LAB — TSH: TSH: 13.893 u[IU]/mL — ABNORMAL HIGH (ref 0.350–4.500)

## 2015-07-29 LAB — CBC
HEMATOCRIT: 35.3 % — AB (ref 36.0–46.0)
HEMOGLOBIN: 10.9 g/dL — AB (ref 12.0–15.0)
MCH: 25.6 pg — AB (ref 26.0–34.0)
MCHC: 30.9 g/dL (ref 30.0–36.0)
MCV: 83.1 fL (ref 78.0–100.0)
Platelets: 318 10*3/uL (ref 150–400)
RBC: 4.25 MIL/uL (ref 3.87–5.11)
RDW: 16 % — AB (ref 11.5–15.5)
WBC: 9.8 10*3/uL (ref 4.0–10.5)

## 2015-07-29 LAB — RAPID URINE DRUG SCREEN, HOSP PERFORMED
Amphetamines: NOT DETECTED
BENZODIAZEPINES: NOT DETECTED
Barbiturates: NOT DETECTED
COCAINE: NOT DETECTED
Opiates: POSITIVE — AB
Tetrahydrocannabinol: NOT DETECTED

## 2015-07-29 LAB — URINALYSIS, ROUTINE W REFLEX MICROSCOPIC
Bilirubin Urine: NEGATIVE
GLUCOSE, UA: 500 mg/dL — AB
Hgb urine dipstick: NEGATIVE
Ketones, ur: NEGATIVE mg/dL
Nitrite: NEGATIVE
PROTEIN: NEGATIVE mg/dL
Specific Gravity, Urine: 1.012 (ref 1.005–1.030)
UROBILINOGEN UA: 0.2 mg/dL (ref 0.0–1.0)
pH: 6.5 (ref 5.0–8.0)

## 2015-07-29 LAB — MRSA PCR SCREENING: MRSA by PCR: NEGATIVE

## 2015-07-29 LAB — URINE MICROSCOPIC-ADD ON

## 2015-07-29 LAB — TROPONIN I

## 2015-07-29 LAB — BRAIN NATRIURETIC PEPTIDE: B Natriuretic Peptide: 333.2 pg/mL — ABNORMAL HIGH (ref 0.0–100.0)

## 2015-07-29 LAB — I-STAT TROPONIN, ED: Troponin i, poc: 0.01 ng/mL (ref 0.00–0.08)

## 2015-07-29 LAB — GLUCOSE, CAPILLARY: Glucose-Capillary: 152 mg/dL — ABNORMAL HIGH (ref 65–99)

## 2015-07-29 MED ORDER — CETYLPYRIDINIUM CHLORIDE 0.05 % MT LIQD
7.0000 mL | Freq: Two times a day (BID) | OROMUCOSAL | Status: DC
  Administered 2015-07-29 – 2015-07-30 (×2): 7 mL via OROMUCOSAL

## 2015-07-29 MED ORDER — METHOCARBAMOL 500 MG PO TABS
500.0000 mg | ORAL_TABLET | Freq: Two times a day (BID) | ORAL | Status: DC | PRN
  Administered 2015-07-30 (×2): 500 mg via ORAL
  Filled 2015-07-29 (×2): qty 1

## 2015-07-29 MED ORDER — GI COCKTAIL ~~LOC~~
30.0000 mL | Freq: Four times a day (QID) | ORAL | Status: DC | PRN
Start: 2015-07-29 — End: 2015-07-31
  Administered 2015-07-30: 30 mL via ORAL
  Filled 2015-07-29: qty 30

## 2015-07-29 MED ORDER — INFLUENZA VAC SPLIT QUAD 0.5 ML IM SUSY
0.5000 mL | PREFILLED_SYRINGE | INTRAMUSCULAR | Status: AC
Start: 2015-07-30 — End: 2015-07-30
  Administered 2015-07-30: 0.5 mL via INTRAMUSCULAR

## 2015-07-29 MED ORDER — HEPARIN SODIUM (PORCINE) 5000 UNIT/ML IJ SOLN
5000.0000 [IU] | Freq: Three times a day (TID) | INTRAMUSCULAR | Status: DC
  Administered 2015-07-29 – 2015-07-31 (×6): 5000 [IU] via SUBCUTANEOUS
  Filled 2015-07-29 (×6): qty 1

## 2015-07-29 MED ORDER — ONDANSETRON HCL 4 MG/2ML IJ SOLN: 4.0000 mg | Freq: Four times a day (QID) | INTRAMUSCULAR | Status: DC | PRN

## 2015-07-29 MED ORDER — ASPIRIN EC 325 MG PO TBEC
325.0000 mg | DELAYED_RELEASE_TABLET | Freq: Every day | ORAL | Status: DC
  Administered 2015-07-29: 325 mg via ORAL
  Filled 2015-07-29: qty 1

## 2015-07-29 MED ORDER — ASPIRIN EC 81 MG PO TBEC
81.0000 mg | DELAYED_RELEASE_TABLET | Freq: Every day | ORAL | Status: DC
  Administered 2015-07-30 – 2015-07-31 (×2): 81 mg via ORAL
  Filled 2015-07-29 (×2): qty 1

## 2015-07-29 MED ORDER — SODIUM CHLORIDE 0.9 % IV BOLUS (SEPSIS)
1000.0000 mL | Freq: Once | INTRAVENOUS | Status: AC
  Administered 2015-07-29: 1000 mL via INTRAVENOUS

## 2015-07-29 MED ORDER — PANTOPRAZOLE SODIUM 40 MG PO TBEC
40.0000 mg | DELAYED_RELEASE_TABLET | Freq: Every day | ORAL | Status: DC
  Administered 2015-07-30 – 2015-07-31 (×2): 40 mg via ORAL
  Filled 2015-07-29 (×2): qty 1

## 2015-07-29 MED ORDER — HYDROMORPHONE HCL 1 MG/ML IJ SOLN: 1.0000 mg | Freq: Once | INTRAMUSCULAR | Status: DC

## 2015-07-29 MED ORDER — INSULIN GLARGINE 100 UNIT/ML ~~LOC~~ SOLN
40.0000 [IU] | Freq: Every day | SUBCUTANEOUS | Status: DC
  Administered 2015-07-29 – 2015-07-30 (×2): 40 [IU] via SUBCUTANEOUS
  Filled 2015-07-29 (×3): qty 0.4

## 2015-07-29 MED ORDER — GABAPENTIN 300 MG PO CAPS
300.0000 mg | ORAL_CAPSULE | Freq: Three times a day (TID) | ORAL | Status: DC
  Administered 2015-07-29 – 2015-07-31 (×6): 300 mg via ORAL
  Filled 2015-07-29 (×7): qty 1

## 2015-07-29 MED ORDER — BUPROPION HCL ER (XL) 300 MG PO TB24
300.0000 mg | ORAL_TABLET | Freq: Every day | ORAL | Status: DC
  Administered 2015-07-30 – 2015-07-31 (×2): 300 mg via ORAL
  Filled 2015-07-29 (×2): qty 1

## 2015-07-29 MED ORDER — POTASSIUM CHLORIDE CRYS ER 20 MEQ PO TBCR
40.0000 meq | EXTENDED_RELEASE_TABLET | Freq: Every day | ORAL | Status: AC
  Administered 2015-07-29 – 2015-07-31 (×3): 40 meq via ORAL
  Filled 2015-07-29 (×3): qty 2

## 2015-07-29 MED ORDER — LORATADINE 10 MG PO TABS
10.0000 mg | ORAL_TABLET | Freq: Every day | ORAL | Status: DC
  Administered 2015-07-30 – 2015-07-31 (×2): 10 mg via ORAL
  Filled 2015-07-29 (×2): qty 1

## 2015-07-29 MED ORDER — ACETAMINOPHEN 325 MG PO TABS
650.0000 mg | ORAL_TABLET | Freq: Four times a day (QID) | ORAL | Status: DC | PRN
  Filled 2015-07-29 (×4): qty 2

## 2015-07-29 MED ORDER — ALPRAZOLAM 0.5 MG PO TABS
0.5000 mg | ORAL_TABLET | Freq: Two times a day (BID) | ORAL | Status: DC | PRN
  Administered 2015-07-29 – 2015-07-31 (×3): 0.5 mg via ORAL
  Filled 2015-07-29 (×3): qty 1

## 2015-07-29 MED ORDER — SIMVASTATIN 20 MG PO TABS
10.0000 mg | ORAL_TABLET | Freq: Every day | ORAL | Status: DC
  Administered 2015-07-30 – 2015-07-31 (×2): 10 mg via ORAL
  Filled 2015-07-29 (×2): qty 1

## 2015-07-29 MED ORDER — POTASSIUM CHLORIDE CRYS ER 20 MEQ PO TBCR
40.0000 meq | EXTENDED_RELEASE_TABLET | Freq: Once | ORAL | Status: AC
  Administered 2015-07-29: 40 meq via ORAL
  Filled 2015-07-29: qty 2

## 2015-07-29 MED ORDER — ARIPIPRAZOLE 5 MG PO TABS
5.0000 mg | ORAL_TABLET | Freq: Every day | ORAL | Status: DC
  Administered 2015-07-30 – 2015-07-31 (×2): 5 mg via ORAL
  Filled 2015-07-29 (×2): qty 1

## 2015-07-29 MED ORDER — HYDROCODONE-ACETAMINOPHEN 5-325 MG PO TABS
1.0000 | ORAL_TABLET | Freq: Every day | ORAL | Status: DC | PRN
  Administered 2015-07-29 – 2015-07-31 (×3): 1 via ORAL
  Filled 2015-07-29 (×3): qty 1

## 2015-07-29 MED ORDER — IPRATROPIUM-ALBUTEROL 0.5-2.5 (3) MG/3ML IN SOLN
3.0000 mL | RESPIRATORY_TRACT | Status: DC | PRN
  Filled 2015-07-29: qty 3

## 2015-07-29 MED ORDER — MORPHINE SULFATE (PF) 2 MG/ML IV SOLN: 2.0000 mg | INTRAVENOUS | Status: DC | PRN

## 2015-07-29 MED ORDER — ALBUTEROL SULFATE (2.5 MG/3ML) 0.083% IN NEBU: 2.5000 mg | INHALATION_SOLUTION | Freq: Two times a day (BID) | RESPIRATORY_TRACT | Status: DC | PRN

## 2015-07-29 MED ORDER — INSULIN ASPART 100 UNIT/ML ~~LOC~~ SOLN
0.0000 [IU] | Freq: Three times a day (TID) | SUBCUTANEOUS | Status: DC
  Administered 2015-07-29: 3 [IU] via SUBCUTANEOUS
  Administered 2015-07-30 (×2): 5 [IU] via SUBCUTANEOUS
  Administered 2015-07-31 (×2): 3 [IU] via SUBCUTANEOUS

## 2015-07-29 MED ORDER — FUROSEMIDE 40 MG PO TABS: 40.0000 mg | ORAL_TABLET | Freq: Every day | ORAL | Status: DC

## 2015-07-29 MED ORDER — LEVOTHYROXINE SODIUM 50 MCG PO TABS
50.0000 ug | ORAL_TABLET | Freq: Every day | ORAL | Status: DC
  Administered 2015-07-30: 50 ug via ORAL
  Filled 2015-07-29: qty 1

## 2015-07-29 MED ORDER — FUROSEMIDE 10 MG/ML IJ SOLN
40.0000 mg | Freq: Two times a day (BID) | INTRAMUSCULAR | Status: AC
  Administered 2015-07-29 – 2015-07-30 (×2): 40 mg via INTRAVENOUS
  Filled 2015-07-29 (×2): qty 4

## 2015-07-29 MED ORDER — ACETAMINOPHEN 325 MG PO TABS
650.0000 mg | ORAL_TABLET | ORAL | Status: DC | PRN
  Administered 2015-07-30 – 2015-07-31 (×4): 650 mg via ORAL
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
## 2015-07-29 NOTE — ED Notes (Signed)
Attempted report to 2H 

## 2015-07-29 NOTE — ED Notes (Addendum)
Pt. Reports that sob began around 0200 am and then her chest pain lt. Side began around 3 hours ago,  Pain is non radiating, and is dexcribed as a dull ache. Pt. Is alert and oriented X 4.  Skin is p/w/d,  Pt. Has a hx of chronic back pain.  But she believes that she may have a UTI due to yellow urine.   She denies any dysuria

## 2015-07-29 NOTE — Consult Note (Addendum)
CARDIOLOGY CONSULT NOTE  Referring Provider:  Algis Downs Primary Cardiologist: Patty Sermons Reason for Consultation: CP   HPI:  [name] is a 54 y.o. 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  Per patient report she had a heart attack in 2007. Our consult note from 11/2006 indicates the patient had been seen in 07/2006 by her PCP and was told based on an EKG that she may have had a prior MI. She had a low risk stress test at that time.  In 12/14 had CP and repeat Myoview EF 73%. + breast attenuation. No scar or ischemia. Admitted this summer with diastolic HF. Echo with EF 65-70%. MV not seen well but read as severe stenosis with mean gradient 14mm HG. However pressure 1/2 time 2.1 cm2 (suggestive of mild MS)   She reports that since Thursday she has had increased difficulty breathing with orthopnea and PND. She has had a dry cough without sputum production.No fevers or chills. Her breathing got much worse this morning wen she was lying down and she came to ER. She also had some chest tightness today that lasted a few hours but resolved. She said it felt like her GERD. Her breathing is also better now. + ankle edema. Drinks a lot of fluid. Her weight is up 5-8 pounds since September.   In ER troponin 0.01 and ECG was normal.  BNP 333 (106 3 months ago). CXR with mild pulmonary edema. WBC 9.8     Review of Systems:     Cardiac Review of Systems: {Y] = yes  = no  Chest Pain [  y  ]  Resting SOB [ y  ] Exertional SOB  [ y ]  Orthopnea [  ]   Pedal Edema [   ]    Palpitations [  ] Syncope  [  ]   Presyncope [   ]  General Review of Systems: [Y] = yes [  ]=no Constitional: recent weight change [  ]; anorexia [  ]; fatigue [ y ]; nausea [ y ]; night sweats [  ]; fever [  ]; or chills [  ];                                                                      Eyes : blurred vision [  ]; diplopia [   ]; vision changes [  ];  Amaurosis fugax[  ]; Resp: cough [  y];  wheezing[  ];  hemoptysis[  ];  PND [ y ];  GI:  gallstones[  ], vomiting[  ];  dysphagia[  ]; melena[  ];  hematochezia [  ]; heartburn[  ];   GU: kidney stones [  ]; hematuria[  ];   dysuria [  ];  nocturia[  ]; incontinence [  ];             Skin: rash, swelling[  ];, hair loss[  ];  peripheral edema[  ];  or itching[  ]; Musculosketetal: myalgias[  ];  joint swelling[  ];  joint erythema[  ];  joint pain[  y];  back pain[  ];  Heme/Lymph: bruising[  ];  bleeding[  ];  anemia[  ];  Neuro: TIA[  ];  headaches[y  ];  stroke[  ];  vertigo[  ];  seizures[  ];   paresthesias[  ];  difficulty walking[  ];  Psych:depression[ y ]; anxiety[ y ];  Endocrine: diabetes[ y ];  thyroid dysfunction[  ];  Other:  Past Medical History  Diagnosis Date  . Hypertension   . Anemia   . Chronic back pain     "all over"  . Neuropathy (HCC)   . Hypercholesterolemia   . Major depressive disorder, recurrent (HCC)   . Generalized anxiety disorder   . Panic disorder with agoraphobia   . Tobacco abuse   . MI (myocardial infarction) (HCC) 2007    a. Per patient report she had a heart attack in 2007. Our consult note from 11/2006 indicates the patient had been seen in 07/2006 by her PCP and was told based on an EKG that she may have had a prior MI. She had undergone a low risk stress test at that time.  . Type II diabetes mellitus (HCC)   . Heart murmur   . Emphysema of lung (HCC)   . Pneumonia 1960's X 2  . On home oxygen therapy     "2L prn" (04/12/2015)  . Thin blood (HCC)   . History of blood transfusion     "related to OR"  . GERD (gastroesophageal reflux disease)   . Arthritis     "hands, left knee, left shoulder, back feet" (04/12/2015)  . Bipolar disorder (HCC)   . Rheumatic fever   . Mitral stenosis   . COPD (chronic obstructive pulmonary disease) (HCC)   .  Hypothyroidism   . Chronic back pain     Medications Prior to Admission  Medication Sig Dispense Refill  . acetaminophen (TYLENOL) 325 MG tablet Take 650 mg by mouth every 6 (six) hours as needed (pain).    Marland Kitchen. albuterol (PROAIR HFA) 108 (90 BASE) MCG/ACT inhaler Inhale 1 puff into the lungs 2 (two) times daily as needed for wheezing or shortness of breath.    . ALPRAZolam (XANAX) 0.5 MG tablet Take 1 tablet (0.5 mg total) by mouth 2 (two) times daily as needed for anxiety. (Patient taking differently: Take 0.5 mg by mouth 2 (two) times daily. ) 30 tablet 0  . ARIPiprazole (ABILIFY) 5 MG tablet Take 5 mg by mouth daily.    Marland Kitchen. buPROPion (WELLBUTRIN XL) 300 MG 24 hr tablet Take 300 mg by mouth daily.    . canagliflozin (INVOKANA) 100 MG TABS tablet Take 100 mg by mouth daily.    . cetirizine (ZYRTEC) 10 MG tablet Take 10 mg by mouth daily.     . furosemide (LASIX) 40 MG tablet Take 1 tablet (40 mg total) by mouth daily. 30 tablet 0  . gabapentin (NEURONTIN) 300 MG capsule Take 300 mg by mouth 3 (three) times daily.    Marland Kitchen. glimepiride (AMARYL) 4 MG tablet Take 4 mg by mouth daily.    Marland Kitchen. HYDROcodone-acetaminophen (NORCO/VICODIN) 5-325 MG per tablet 1-2  By mouth mouth every 4 hours as needed for pain NOT TO EXCEED 3 GM OF APAP FROM ALL SOURCES (Patient taking differently: Take 1 tablet by mouth daily. NOT TO EXCEED 3 GM OF APAP FROM ALL SOURCES) 180 tablet 0  . insulin aspart (NOVOLOG FLEXPEN) 100 UNIT/ML FlexPen Inject 6-15 Units into the skin 3 (three) times daily as needed for high blood sugar (CBG >100).  CBG 100-200 6-8 units, >200 15 units    . insulin glargine (LANTUS) 100 unit/mL SOPN Inject 50 Units into the skin at bedtime.    Marland Kitchen levothyroxine (SYNTHROID, LEVOTHROID) 50 MCG tablet Take 50 mcg by mouth daily before breakfast.    . losartan (COZAAR) 100 MG tablet Take 100 mg by mouth daily.    . methocarbamol (ROBAXIN) 500 MG tablet Take 500 mg by mouth 2 (two) times daily as needed for muscle  spasms.    . metoprolol tartrate (LOPRESSOR) 25 MG tablet Take 0.5 tablets (12.5 mg total) by mouth 2 (two) times daily. 60 tablet 0  . omeprazole (PRILOSEC) 20 MG capsule Take 20 mg by mouth daily.    . potassium chloride SA (K-DUR,KLOR-CON) 20 MEQ tablet Take 1 tablet (20 mEq total) by mouth daily. 15 tablet 0  . simvastatin (ZOCOR) 10 MG tablet Take 10 mg by mouth daily.    . [DISCONTINUED] meloxicam (MOBIC) 7.5 MG tablet Take 7.5 mg by mouth 2 (two) times daily.    . [DISCONTINUED] insulin aspart (NOVOLOG) 100 UNIT/ML injection Inject 6 Units into the skin 3 (three) times daily with meals. (Patient not taking: Reported on 07/29/2015) 10 mL 0  . [DISCONTINUED] insulin glargine (LANTUS) 100 UNIT/ML injection Inject 0.52 mLs (52 Units total) into the skin at bedtime. (Patient not taking: Reported on 07/29/2015) 10 mL 0     . [START ON 07/30/2015] ARIPiprazole  5 mg Oral Daily  . aspirin EC  325 mg Oral Daily  . [START ON 07/30/2015] buPROPion  300 mg Oral Daily  . [START ON 07/30/2015] furosemide  40 mg Oral Daily  . gabapentin  300 mg Oral TID  . heparin  5,000 Units Subcutaneous 3 times per day  . [START ON 07/30/2015] Influenza vac split quadrivalent PF  0.5 mL Intramuscular Tomorrow-1000  . insulin glargine  40 Units Subcutaneous QHS  . [START ON 07/30/2015] levothyroxine  50 mcg Oral QAC breakfast  . [START ON 07/30/2015] loratadine  10 mg Oral Daily  . [START ON 07/30/2015] pantoprazole  40 mg Oral Daily  . potassium chloride SA  40 mEq Oral Daily  . [START ON 07/30/2015] simvastatin  10 mg Oral Daily    Infusions:    Allergies  Allergen Reactions  . Trazodone And Nefazodone Other (See Comments)    Shortness of breath.  . Celecoxib Nausea Only  . Ibuprofen Nausea Only    Social History   Social History  . Marital Status: Divorced    Spouse Name: N/A  . Number of Children: 2  . Years of Education: N/A   Occupational History  . Disabled    Social History Main Topics  .  Smoking status: Former Smoker -- 1.00 packs/day for 20 years    Types: Cigarettes    Quit date: 07/05/2012  . Smokeless tobacco: Never Used  . Alcohol Use: Yes     Comment: 04/12/2015 "quit drinking in 2011"  . Drug Use: Yes    Special: "Crack" cocaine     Comment: 04/11/2014 "no drugs since 2005"  . Sexual Activity: Not Currently   Other Topics Concern  . Not on file   Social History Narrative    Family History  Problem Relation Age of Onset  . Heart disease Mother   . Lung cancer Father     was a former smoker    PHYSICAL EXAM: Filed Vitals:   07/29/15 1700  BP: 106/61  Pulse: 70  Temp:   Resp: 20  Intake/Output Summary (Last 24 hours) at 07/29/15 1742 Last data filed at 07/29/15 1701  Gross per 24 hour  Intake      0 ml  Output    200 ml  Net   -200 ml    General:  Obese women lying in bed on O2. No respiratory difficulty HEENT: normal Neck: supple. Thick herd to see JVP Carotids 2+ bilat; no bruits. No lymphadenopathy or thryomegaly appreciated. Cor: PMI nondisplaced. Regular rate & rhythm. No rubs, gallops or murmurs. Lungs: clear with minimal basilar crackles  Abdomen: soft, obese nontender, nondistended. No hepatosplenomegaly. No bruits or masses. Good bowel sounds. Extremities: no cyanosis, clubbing, rash, trace  edema Neuro: alert & oriented x 3, cranial nerves grossly intact. moves all 4 extremities w/o difficulty. Affect pleasant.  ECG: NSR 68 No ST-T wave abnormalities.    Results for orders placed or performed during the hospital encounter of 07/29/15 (from the past 24 hour(s))  Basic metabolic panel     Status: Abnormal   Collection Time: 07/29/15  1:45 PM  Result Value Ref Range   Sodium 140 135 - 145 mmol/L   Potassium 3.5 3.5 - 5.1 mmol/L   Chloride 102 101 - 111 mmol/L   CO2 30 22 - 32 mmol/L   Glucose, Bld 158 (H) 65 - 99 mg/dL   BUN 16 6 - 20 mg/dL   Creatinine, Ser 1.61 (H) 0.44 - 1.00 mg/dL   Calcium 9.0 8.9 - 09.6 mg/dL   GFR  calc non Af Amer 59 (L) >60 mL/min   GFR calc Af Amer >60 >60 mL/min   Anion gap 8 5 - 15  CBC     Status: Abnormal   Collection Time: 07/29/15  1:45 PM  Result Value Ref Range   WBC 9.8 4.0 - 10.5 K/uL   RBC 4.25 3.87 - 5.11 MIL/uL   Hemoglobin 10.9 (L) 12.0 - 15.0 g/dL   HCT 04.5 (L) 40.9 - 81.1 %   MCV 83.1 78.0 - 100.0 fL   MCH 25.6 (L) 26.0 - 34.0 pg   MCHC 30.9 30.0 - 36.0 g/dL   RDW 91.4 (H) 78.2 - 95.6 %   Platelets 318 150 - 400 K/uL  Brain natriuretic peptide     Status: Abnormal   Collection Time: 07/29/15  1:45 PM  Result Value Ref Range   B Natriuretic Peptide 333.2 (H) 0.0 - 100.0 pg/mL  I-stat troponin, ED (not at [facility], [facility])     Status: None   Collection Time: 07/29/15  1:55 PM  Result Value Ref Range   Troponin i, poc 0.01 0.00 - 0.08 ng/mL   Comment 3          Glucose, capillary     Status: Abnormal   Collection Time: 07/29/15  5:13 PM  Result Value Ref Range   Glucose-Capillary 152 (H) 65 - 99 mg/dL   Comment 1 Capillary Specimen    Dg Chest 2 View  07/29/2015  CLINICAL DATA:  54 year old female with central chest pain for the past 4 hours. Shortness of breath for the past 3 days. EXAM: CHEST  2 VIEW COMPARISON:  Chest x-ray 04/16/2015. FINDINGS: There is cephalization of the pulmonary vasculature and slight indistinctness of the interstitial markings suggestive of mild pulmonary edema. No pleural effusions. Mild cardiomegaly. The patient is rotated to the left on today's exam, resulting in distortion of the mediastinal contours and reduced diagnostic sensitivity and specificity for mediastinal pathology. Orthopedic fixation hardware throughout the cervical spine. Status  post left shoulder arthroplasty. Posttraumatic deformity of the right proximal humerus with heterotopic ossification extending cephalad from the humeral head. IMPRESSION: 1. The appearance of the chest suggests mild congestive heart failure, as above. Electronically Signed   By: Trudie Reed M.D.    On: 07/29/2015 14:44    ASSESSMENT: 1. CP 2. Acute on chronic respiratory failure 3. Acute on chronic diastolic HF 4. DM2 5. Bipolar disorder 6. COPD quit tobacco 08/28/2012 7. Hypokalemia 8. Rheumatic heart disease with mild mitral stenosis  PLAN/DISCUSSION:  I suspect the major issue here is a mild CHF flare. Will give two doses IV lasix and see how she does. Doubt this is ischemia but will cycle troponins to be complete. Supp K. May benefit from addition of spiro on d/c. I have reviewed echo images from 7/16 personally and although MV gradient is elevated the pressure half time and visual appearance of the valve suggest only mild stenosis.   Sandria Mcenroe,MD 6:12 PM
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
## 2015-07-29 NOTE — ED Notes (Signed)
Patient transported to X-ray 

## 2015-07-29 NOTE — ED Notes (Signed)
Admitting at bedside 

## 2015-07-30 DIAGNOSIS — Z794 Long term (current) use of insulin: Secondary | ICD-10-CM

## 2015-07-30 DIAGNOSIS — J438 Other emphysema: Secondary | ICD-10-CM

## 2015-07-30 DIAGNOSIS — I5033 Acute on chronic diastolic (congestive) heart failure: Secondary | ICD-10-CM | POA: Diagnosis not present

## 2015-07-30 DIAGNOSIS — I208 Other forms of angina pectoris: Secondary | ICD-10-CM | POA: Diagnosis not present

## 2015-07-30 DIAGNOSIS — F329 Major depressive disorder, single episode, unspecified: Secondary | ICD-10-CM | POA: Diagnosis not present

## 2015-07-30 DIAGNOSIS — F319 Bipolar disorder, unspecified: Secondary | ICD-10-CM | POA: Diagnosis present

## 2015-07-30 DIAGNOSIS — F4000 Agoraphobia, unspecified: Secondary | ICD-10-CM

## 2015-07-30 DIAGNOSIS — E038 Other specified hypothyroidism: Secondary | ICD-10-CM

## 2015-07-30 DIAGNOSIS — R079 Chest pain, unspecified: Secondary | ICD-10-CM | POA: Diagnosis not present

## 2015-07-30 LAB — BASIC METABOLIC PANEL
ANION GAP: 7 (ref 5–15)
BUN: 19 mg/dL (ref 6–20)
CALCIUM: 8.6 mg/dL — AB (ref 8.9–10.3)
CO2: 32 mmol/L (ref 22–32)
Chloride: 100 mmol/L — ABNORMAL LOW (ref 101–111)
Creatinine, Ser: 1.12 mg/dL — ABNORMAL HIGH (ref 0.44–1.00)
GFR, EST NON AFRICAN AMERICAN: 54 mL/min — AB (ref >60)
Glucose, Bld: 258 mg/dL — ABNORMAL HIGH (ref 65–99)
POTASSIUM: 3.9 mmol/L (ref 3.5–5.1)
SODIUM: 139 mmol/L (ref 135–145)

## 2015-07-30 LAB — GLUCOSE, CAPILLARY
GLUCOSE-CAPILLARY: 92 mg/dL (ref 65–99)
GLUCOSE-CAPILLARY: 214 mg/dL — AB (ref 65–99)
GLUCOSE-CAPILLARY: 202 mg/dL — AB (ref 65–99)
Glucose-Capillary: 113 mg/dL — ABNORMAL HIGH (ref 65–99)
Glucose-Capillary: 163 mg/dL — ABNORMAL HIGH (ref 65–99)

## 2015-07-30 MED ORDER — LEVOTHYROXINE SODIUM 100 MCG PO TABS
100.0000 ug | ORAL_TABLET | Freq: Every day | ORAL | Status: DC
  Administered 2015-07-31: 100 ug via ORAL
  Filled 2015-07-30: qty 1

## 2015-07-30 MED ORDER — FUROSEMIDE 10 MG/ML IJ SOLN
40.0000 mg | Freq: Once | INTRAMUSCULAR | Status: AC
  Administered 2015-07-30: 40 mg via INTRAVENOUS
  Filled 2015-07-30: qty 4

## 2015-07-30 MED ORDER — LEVALBUTEROL HCL 1.25 MG/0.5ML IN NEBU: 1.2500 mg | INHALATION_SOLUTION | Freq: Four times a day (QID) | RESPIRATORY_TRACT | Status: DC | PRN

## 2015-07-30 MED ORDER — ARFORMOTEROL TARTRATE 15 MCG/2ML IN NEBU
15.0000 ug | INHALATION_SOLUTION | Freq: Two times a day (BID) | RESPIRATORY_TRACT | Status: DC
  Administered 2015-07-30 – 2015-07-31 (×2): 15 ug via RESPIRATORY_TRACT
  Filled 2015-07-30 (×2): qty 2

## 2015-07-30 NOTE — Progress Notes (Signed)
HPI [name] is a 54 y.o. 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  Per patient report she had a heart attack in 2007. Our consult note from 11/2006 indicates the patient had been seen in 07/2006 by her PCP and was told based on an EKG that she may have had a prior MI. She had a low risk stress test at that time.  In 12/14 had CP and repeat Myoview EF 73%. + breast attenuation. No scar or ischemia. Admitted this summer with diastolic HF. Echo with EF 65-70%. MV not seen well but read as severe stenosis with mean gradient 14mm HG. However pressure 1/2 time 2.1 cm2 (suggestive of mild MS)   She reports that since Thursday she has had increased difficulty breathing with orthopnea and PND. She has had a dry cough without sputum production.No fevers or chills. Her breathing got much worse this morning wen she was lying down and she came to ER. She also had some chest tightness today that lasted a few hours but resolved. She said it felt like her GERD. Her breathing is also better now. + ankle edema. Drinks a lot of fluid. Her weight is up 5-8 pounds since September.   In ER troponin 0.01 and ECG was normal. BNP 333 (106 3 months ago). CXR with mild pulmonary edema. WBC 9.8  Subjective:  This morning, she denies any chest pain or feeling any worsening dyspnea. She has not been out of the bed to assess how she is doing with her breathing. At home, she uses her oxygen intermittently and is anxious to go home.  Objective:  Vital Signs in the last 24 hours: Temp:  [97.1 F (36.2 C)-98.5 F (36.9 C)] 97.1 F (36.2 C) (11/06 0800) Pulse Rate:  [60-80] 65 (11/06 0800) Resp:  [13-23] 13 (11/06 0800) BP: (81-120)/(41-100) 96/59 mmHg (11/06 0800) SpO2:  [93 %-100 %] 99 % (11/06 0800) Weight:  [205 lb (92.987 kg)-210 lb 15.7 oz (95.7 kg)]  210 lb 12.2 oz (95.6 kg) (11/06 0600)  Intake/Output from previous day: 11/05 0701 - 11/06 0700 In: 480 [P.O.:480] Out: 600 [Urine:600]  Physical Exam: General: obese Caucasian female, resting in bed, 3L O2 by  HEENT: obese neck and limited visualization of JVD, oropharynx clear Cardiac: RRR, no rubs, murmurs or gallops Pulm: bibasilar crackles Abd: soft, nontender, nondistended, BS present Ext: warm and well perfused, tr-1+ pitting edema L>R Neuro: responds to questions appropriately; moving all extremities freely   Lab Results:  Recent Labs  07/29/15 1345  WBC 9.8  HGB 10.9*  PLT 318    Recent Labs  07/29/15 1345  NA 140  K 3.5  CL 102  CO2 30  GLUCOSE 158*  BUN 16  CREATININE 1.04*    Recent Labs  07/29/15 1945 07/29/15 2247  TROPONINI <0.03 <0.03    Assessment/Plan:  1. Chest pain: Resolved. Troponins x 3 negative with stable findings on telemetry overnight.. May be in the setting of her respiratory failure as she reports improvement with albuterol.  2. Acute on chronic diastolic HF: Wt 210 today, stable from yesterday. 600cc UO yesterday, net -.12 since admission. Physical exam findings suggestive of mild hypervolemia. Received Lasix  IV x 1 yesterday.  -Give Lasix  IV  -Check BMET since no labwork this AM.  3. Acute on chronic respiratory failure: As noted above.  -Ambulate patient to assess her  oxygen  4. Hypothyroidism: TSH 13.893 on admission. Home meds include Synthroid 50mcg. Unclear if it's adherence issue or poor control. Per primary team.   4. DM2  5. Bipolar disorder  6. COPD quit tobacco 08/28/2012  7. Hypokalemia: No BMET this AM. Give 40mEq Kdur now with Lasix 20mg  IV.  8. Rheumatic heart disease with mild mitral stenosis  Heywood [name], [name], PGY2  07/30/2015, 10:46 AM  Patient seen and examined with Dr. Allena KatzPatel. We discussed all aspects of the encounter. I agree with the assessment and plan as stated above.   [name]  improved. Lying flat without dyspnea. Still with very mild edema. Will give one more dose IV lasix and check BMET. Can likely go home later today.   Elizabeth Meeker,MD 11:24 AM

## 2015-07-30 NOTE — Progress Notes (Signed)
Patient ambulated 50 feet on 2 L nasal cannula.  Per patient, this distance is the normal distance she ambulates at home at a time and she wears 2 L oxygen at home when she ambulates.   Pre-ambulation oxygen saturation on 2L oxygen: 98% During ambulation oxygen saturation on 2L oxygen: 94-97% Post-ambulation oxygen saturation on 2L oxygen: 97%  Patient tolerated ambulation well with no complaints of increased SOB. Ivery QualeJackson, Davia Smyre A, RN

## 2015-07-30 NOTE — Procedures (Signed)
Called to give pt breathing treatment.  Upon arrival, pt resting comfortably without distress.  Clear diminished breath sounds noted.  Asked pt if she would like and treatment and she declined.  Pt states that she prefers her MDI.

## 2015-07-30 NOTE — Progress Notes (Signed)
[name] - Stepdown/ICU TEAM Progress Note  [name] WUJ:[id number] DOB: [birth year] DOA: 07/29/2015 PCP: Elizabeth German, MD  Admit HPI / Brief Narrative: [name] is a 54 y.o. WF,PMHx  Bipolar DO/Anxiety/Agoraphobia, COPD (emphysema) on 2 L O2 PRN, HTN, HLD, DM Type 2, Hypothyroidism, and Diastolic Dysfunction, heart murmur, chronic back pain   Presents the emergency department today with chest pain. The patient reports that since Thursday she has had increased difficulty breathing. Sitting up has helped improve her breathing. She has had a dry cough without sputum production. She normally wakes up at approximately 2 AM, and when she woke this morning at 2 AM she had difficulty breathing and put on her oxygen. At 12:30 PM today her daughter called EMS for her because she could not breathe. [name] complaining of chest pain that also started approximately 2 AM she states it is jabbing in nature and started just to the right of her sternum that radiates left. It is intermittent but lasts for 3-4 hours at a time. Currently she states her chest pain is an 8/10. She complains of having sinus congestion in her head and chest for the past week. She has been nauseated, having headaches, and has dark urine.  The patient had an admission in July 2016 during which she was treated for diastolic heart failure. She also had a right arm fracture. [name] scheduled to see [facility] cardiology on Monday for preoperative clearance for orthopedic surgery on her right arm.   HPI/Subjective: 11/6  A/O 4, states takes all medication to include her Synthroid, does not know dry weight. Negative N/V, negative SOB, negative CP.   Assessment/Plan: Chest pain at rest -Uncertain etiology. Patient says that this does not feel like her typical GERD pain and it is not reproducible on exam. -Troponins 3 negative  -EKG has no significant findings.  -Patient reports quitting cocaine 11 years ago- UDS  positive for opiates (not in [facility]) -Continue 325 mg aspirin daily.   Acute Diastolic dysfunction with heart failure -Patient complains of increasing dyspnea on exertion since Thursday. - Most recent 2-D echo shows an LVEF of 65-70% with severe mitral valve stenosis.  -Diuretics per cardiology -Strict in and out -Daily weight since admission -  COPD -Stable. Very minimal wheezing on exam.  -Xopenex nebulizer PRN -Brovana BID  - Patient has a 20-pack-year smoking history but stopped smoking 3 years ago.  -On 2 L of oxygen at home PRN. -Ambulatory SPO2 in a.m.  Hypothyroidism -TSH = 13.8; Pt states taking all meds but Im not 100% convinced.    -[employer] Synthroid to recheck TSH in 6-8 wks  Diabetes mellitus Type 2 uncontrolled -7/21 hemoglobin A1c= 8.8 -Lantus 40 units QHS -Moderate SSI   Dark urine Patient complaining of dark urine. UA pending.  Depression, bipolar, agoraphobia Continue home psychotropic medications   Code Status: FULL Family Communication: no family present at time of exam Disposition Plan: Resolution of acute diastolic CHF    Consultants: Cardiology consulted by ER  Procedure/Significant Events:    Culture 11/5 MRSA by PCR negative  Antibiotics: NA  DVT prophylaxis: Subcutaneous heparin   Devices NA   LINES / TUBES:  NA    Continuous Infusions:   Objective: VITAL SIGNS: Temp: 97.[name] F (36.2 C) (11/06 0800) Temp Source: Oral (11/06 0800) BP: 96/59 mmHg (11/06 0800) Pulse Rate: 65 (11/06 0800) SPO2; FIO2:   Intake/Output Summary (Last 24 hours) at 07/30/15 0842 Last data filed at 07/30/15  0230  Gross per 24 hour  Intake    480 ml  Output    600 ml  Net   -120 ml     Exam: General: A/O 4, NAD, No acute respiratory distress Eyes: Negative headache, eye pain, double vision,negative scleral hemorrhage ENT: Negative Runny nose, negative ear pain, negative gingival bleeding, Neck:  Negative scars, masses,  torticollis, lymphadenopathy, JVD Lungs: Clear to auscultation bilaterally, with decreased breath sounds bibasilar, without wheezes or crackles Cardiovascular: Irregular irregular rhythm and rate,without murmur gallop or rub normal S1 and S2 Abdomen:negative abdominal pain, nondistended, positive soft, bowel sounds, no rebound, no ascites, no appreciable mass Extremities: No significant cyanosis, clubbing, or edema bilateral lower extremities Psychiatric:  Negative depression, negative anxiety, negative fatigue, negative mania  Neurologic:  Cranial nerves II through XII intact, tongue/uvula midline, all extremities muscle strength 5/5, sensation intact throughout, negative dysarthria, negative expressive aphasia, negative receptive aphasia.   Data Reviewed: Basic Metabolic Panel:  Recent Labs Lab 07/29/15 1345  NA 140  K 3.5  CL 102  CO2 30  GLUCOSE 158*  BUN 16  CREATININE [name].04*  CALCIUM 9.0   Liver Function Tests: No results for input(s): AST, ALT, ALKPHOS, BILITOT, PROT, ALBUMIN in the last 168 hours. No results for input(s): LIPASE, AMYLASE in the last 168 hours. No results for input(s): AMMONIA in the last 168 hours. CBC:  Recent Labs Lab 07/29/15 1345  WBC 9.8  HGB 10.9*  HCT 35.3*  MCV 83.[name]  PLT 318   Cardiac Enzymes:  Recent Labs Lab 07/29/15 1715 07/29/15 1945 07/29/15 2247  TROPONINI <0.03 <0.03 <0.03   BNP (last 3 results)  Recent Labs  04/14/15 1400 07/29/15 1345  BNP 105.9* 333.2*    ProBNP (last 3 results) No results for input(s): PROBNP in the last 8760 hours.  CBG:  Recent Labs Lab 07/29/15 1713 07/29/15 2119 07/30/15 0801  GLUCAP 152* 113* 92    Recent Results (from the past 240 hour(s))  MRSA PCR Screening     Status: None   Collection Time: 07/29/15  6:48 PM  Result Value Ref Range Status   MRSA by PCR NEGATIVE NEGATIVE Final    Comment:        The GeneXpert MRSA Assay (FDA approved for NASAL specimens only), is one  component of a comprehensive MRSA colonization surveillance program. It is not intended to diagnose MRSA infection nor to guide or monitor treatment for MRSA infections.      Studies:  Recent x-ray studies have been reviewed in detail by the Attending Physician  Scheduled Meds:  Scheduled Meds: . antiseptic oral rinse  7 mL Mouth Rinse BID  . ARIPiprazole  5 mg Oral Daily  . aspirin EC  81 mg Oral Daily  . buPROPion  300 mg Oral Daily  . furosemide  40 mg Intravenous BID  . gabapentin  300 mg Oral TID  . heparin  5,000 Units Subcutaneous 3 times per day  . Influenza vac split quadrivalent PF  0.5 mL Intramuscular Tomorrow-1000  . insulin aspart  0-15 Units Subcutaneous TID WC  . insulin glargine  40 Units Subcutaneous QHS  . levothyroxine  50 mcg Oral QAC breakfast  . loratadine  10 mg Oral Daily  . pantoprazole  40 mg Oral Daily  . potassium chloride SA  40 mEq Oral Daily  . simvastatin  10 mg Oral Daily    Time spent on care of this patient: 40 mins   Samhitha Rosen, Roselind MessierURTIS J , MD  [facility] Office  [phone] Pager - [phone]  On-Call/Text Page:      [email or web address]      password [email or web address]  If 7PM-7AM, please contact night-coverage [email or web address] Password [email or web address] 07/30/2015, 8:42 AM   LOS: [name] day   Care during the described time interval was provided by me .  I have reviewed this patient's available data, including medical history, events of note, physical examination, and all test results as part of my evaluation. I have personally reviewed and interpreted all radiology studies.   Carolyne Littles, MD [phone] Pager

## 2015-07-31 DIAGNOSIS — I208 Other forms of angina pectoris: Secondary | ICD-10-CM | POA: Diagnosis not present

## 2015-07-31 DIAGNOSIS — I050 Rheumatic mitral stenosis: Secondary | ICD-10-CM | POA: Diagnosis not present

## 2015-07-31 DIAGNOSIS — I5031 Acute diastolic (congestive) heart failure: Secondary | ICD-10-CM | POA: Diagnosis not present

## 2015-07-31 LAB — CBC WITH DIFFERENTIAL/PLATELET
BASOS PCT: 1 %
Basophils Absolute: 0 10*3/uL (ref 0.0–0.1)
EOS ABS: 0.4 10*3/uL (ref 0.0–0.7)
EOS PCT: 4 %
HCT: 34.6 % — ABNORMAL LOW (ref 36.0–46.0)
Hemoglobin: 11 g/dL — ABNORMAL LOW (ref 12.0–15.0)
Lymphocytes Relative: 17 %
Lymphs Abs: 1.5 10*3/uL (ref 0.7–4.0)
MCH: 26.8 pg (ref 26.0–34.0)
MCHC: 31.8 g/dL (ref 30.0–36.0)
MCV: 84.2 fL (ref 78.0–100.0)
MONO ABS: 0.6 10*3/uL (ref 0.1–1.0)
MONOS PCT: 7 %
Neutro Abs: 6.4 10*3/uL (ref 1.7–7.7)
Neutrophils Relative %: 72 %
Platelets: 263 10*3/uL (ref 150–400)
RBC: 4.11 MIL/uL (ref 3.87–5.11)
RDW: 16.5 % — AB (ref 11.5–15.5)
WBC: 8.8 10*3/uL (ref 4.0–10.5)

## 2015-07-31 LAB — COMPREHENSIVE METABOLIC PANEL
ALBUMIN: 3.1 g/dL — AB (ref 3.5–5.0)
ALK PHOS: 90 U/L (ref 38–126)
ALT: 9 U/L — AB (ref 14–54)
AST: 14 U/L — ABNORMAL LOW (ref 15–41)
Anion gap: 9 (ref 5–15)
BUN: 22 mg/dL — ABNORMAL HIGH (ref 6–20)
CALCIUM: 8.5 mg/dL — AB (ref 8.9–10.3)
CO2: 30 mmol/L (ref 22–32)
CREATININE: 0.95 mg/dL (ref 0.44–1.00)
Chloride: 100 mmol/L — ABNORMAL LOW (ref 101–111)
GFR calc Af Amer: >60 mL/min (ref >60)
GFR calc non Af Amer: >60 mL/min (ref >60)
GLUCOSE: 132 mg/dL — AB (ref 65–99)
Potassium: 4 mmol/L (ref 3.5–5.1)
SODIUM: 139 mmol/L (ref 135–145)
Total Bilirubin: 0.9 mg/dL (ref 0.3–1.2)
Total Protein: 5.7 g/dL — ABNORMAL LOW (ref 6.5–8.1)

## 2015-07-31 LAB — GLUCOSE, CAPILLARY
GLUCOSE-CAPILLARY: 157 mg/dL — AB (ref 65–99)
Glucose-Capillary: 164 mg/dL — ABNORMAL HIGH (ref 65–99)

## 2015-07-31 LAB — URINE CULTURE

## 2015-07-31 LAB — MAGNESIUM: MAGNESIUM: 2.1 mg/dL (ref 1.7–2.4)

## 2015-07-31 MED ORDER — FUROSEMIDE 40 MG PO TABS
40.0000 mg | ORAL_TABLET | Freq: Every day | ORAL | Status: DC
  Administered 2015-07-31: 40 mg via ORAL
  Filled 2015-07-31: qty 1

## 2015-07-31 MED ORDER — LEVOTHYROXINE SODIUM 100 MCG PO TABS: 100.0000 ug | ORAL_TABLET | Freq: Every day | ORAL | Status: DC

## 2015-07-31 MED ORDER — ASPIRIN EC 81 MG PO TBEC
81.0000 mg | DELAYED_RELEASE_TABLET | Freq: Every day | ORAL | Status: DC
Start: 2015-07-31 — End: 2016-01-26

## 2015-07-31 NOTE — Progress Notes (Signed)
D/c instructions reviewed with pt, copy of instructions and script given to pt. Pt able to teach back education for heart failure and when to call MD, pt also has handouts. SW arranged for taxi voucher for pt transportation home, otherwise pt has no one to pick her up to take her home, pt's husband does not drive or has no car. Husband at home and will be awaiting her arrival. Pt O2 sats on RA past 2 hours has been 91-97 on RA, pt uses O2 prn at home and prn when she walks, husband will bring her O2 out to her when she arrives to wear as she walks into house. No SOB noted here when up and on RA. Pt getting dressed with NT assist at this time, then will discharge via wheelchair with her belongings, NT will escort pt to taxi and give voucher to taxi driver.

## 2015-07-31 NOTE — Discharge Instructions (Signed)

## 2015-07-31 NOTE — Progress Notes (Signed)
DAILY PROGRESS NOTE  Subjective:  No events overnight. Given a second dose of $Remov'40mg'EuDdRF$  IV lasix yesterday, now -2250 recorded out. Breathing has improved. She can lie flat without difficulty. Creatinine has improved to 0.96 with diuresis. Troponin has remained negative x 3. TSH is elevated at ~13 - synthroid increased.  Objective:  Temp:  [97.5 F (36.4 C)-98.5 F (36.9 C)] 97.5 F (36.4 C) (11/07 0726) Pulse Rate:  [63-79] 77 (11/07 0726) Resp:  [12-17] 15 (11/07 0726) BP: (84-114)/(50-77) 107/67 mmHg (11/07 0726) SpO2:  [93 %-100 %] 100 % (11/07 0803) Weight:  [208 lb 15.9 oz (94.8 kg)] 208 lb 15.9 oz (94.8 kg) (11/07 0230) Weight change: 3 lb 15.9 oz (1.813 kg)  Intake/Output from previous day: 11/06 0701 - 11/07 0700 In: 1060 [P.O.:1060] Out: 2250 [Urine:2250]  Intake/Output from this shift:    Medications: Current Facility-Administered Medications  Medication Dose Route Frequency Provider Last Rate Last Dose  . acetaminophen (TYLENOL) tablet 650 mg  650 mg Oral Q6H PRN Melton Alar, PA-C      . acetaminophen (TYLENOL) tablet 650 mg  650 mg Oral Q4H PRN Melton Alar, PA-C   650 mg at 07/31/15 0234  . ALPRAZolam Duanne Moron) tablet 0.5 mg  0.5 mg Oral BID PRN Melton Alar, PA-C   0.5 mg at 07/30/15 2240  . antiseptic oral rinse (CPC / CETYLPYRIDINIUM CHLORIDE 0.05%) solution 7 mL  7 mL Mouth Rinse BID Maryann Mikhail, DO   7 mL at 07/30/15 1000  . arformoterol (BROVANA) nebulizer solution 15 mcg  15 mcg Nebulization BID Allie Bossier, MD   15 mcg at 07/31/15 0803  . ARIPiprazole (ABILIFY) tablet 5 mg  5 mg Oral Daily Melton Alar, PA-C   5 mg at 07/30/15 1026  . aspirin EC tablet 81 mg  81 mg Oral Daily Jolaine Artist, MD   81 mg at 07/30/15 1027  . buPROPion (WELLBUTRIN XL) 24 hr tablet 300 mg  300 mg Oral Daily Melton Alar, PA-C   300 mg at 07/30/15 1027  . gabapentin (NEURONTIN) capsule 300 mg  300 mg Oral TID Melton Alar, PA-C   300 mg at 07/30/15  2240  . gi cocktail (Maalox,Lidocaine,Donnatal)  30 mL Oral QID PRN Melton Alar, PA-C   30 mL at 07/30/15 1824  . heparin injection 5,000 Units  5,000 Units Subcutaneous 3 times per day Melton Alar, PA-C   5,000 Units at 07/31/15 0541  . HYDROcodone-acetaminophen (NORCO/VICODIN) 5-325 MG per tablet 1 tablet  1 tablet Oral Daily PRN Melton Alar, PA-C   1 tablet at 07/30/15 0845  . insulin aspart (novoLOG) injection 0-15 Units  0-15 Units Subcutaneous TID WC Melton Alar, PA-C   3 Units at 07/31/15 4970  . insulin glargine (LANTUS) injection 40 Units  40 Units Subcutaneous QHS Melton Alar, PA-C   40 Units at 07/30/15 2241  . levalbuterol (XOPENEX) nebulizer solution 1.25 mg  1.25 mg Nebulization Q6H PRN Allie Bossier, MD      . levothyroxine (SYNTHROID, LEVOTHROID) tablet 100 mcg  100 mcg Oral QAC breakfast Allie Bossier, MD   100 mcg at 07/31/15 [phone]  . loratadine (CLARITIN) tablet 10 mg  10 mg Oral Daily Melton Alar, PA-C   10 mg at 07/30/15 1027  . methocarbamol (ROBAXIN) tablet 500 mg  500 mg Oral BID PRN Melton Alar, PA-C   500 mg at 07/30/15 2240  . ondansetron (  ZOFRAN) injection 4 mg  4 mg Intravenous Q6H PRN Melton Alar, PA-C      . pantoprazole (PROTONIX) EC tablet 40 mg  40 mg Oral Daily Melton Alar, PA-C   40 mg at 07/30/15 1026  . potassium chloride SA (K-DUR,KLOR-CON) CR tablet 40 mEq  40 mEq Oral Daily Melton Alar, PA-C   40 mEq at 07/30/15 1000  . simvastatin (ZOCOR) tablet 10 mg  10 mg Oral Daily Melton Alar, PA-C   10 mg at 07/30/15 1027    Physical Exam: General appearance: alert and no distress Neck: no carotid bruit and no JVD Lungs: clear to auscultation bilaterally Heart: regular rate and rhythm, S1, S2 normal, no murmur, click, rub or gallop Abdomen: soft, non-tender; bowel sounds normal; no masses,  no organomegaly Extremities: extremities normal, atraumatic, no cyanosis or edema Pulses: 2+ and symmetric Skin: Skin color,  texture, turgor normal. No rashes or lesions Neurologic: Grossly normal  Lab Results: Results for orders placed or performed during the hospital encounter of 07/29/15 (from the past 48 hour(s))  Basic metabolic panel     Status: Abnormal   Collection Time: 07/29/15  1:45 PM  Result Value Ref Range   Sodium 140 135 - 145 mmol/L   Potassium 3.5 3.5 - 5.1 mmol/L   Chloride 102 101 - 111 mmol/L   CO2 30 22 - 32 mmol/L   Glucose, Bld 158 (H) 65 - 99 mg/dL   BUN 16 6 - 20 mg/dL   Creatinine, Ser 1.04 (H) 0.44 - 1.00 mg/dL   Calcium 9.0 8.9 - 10.3 mg/dL   GFR calc non Af Amer 59 (L) >60 mL/min   GFR calc Af Amer >60 >60 mL/min    Comment: (NOTE) The eGFR has been calculated using the CKD EPI equation. This calculation has not been validated in all clinical situations. eGFR's persistently <60 mL/min signify possible Chronic Kidney Disease.    Anion gap 8 5 - 15  CBC     Status: Abnormal   Collection Time: 07/29/15  1:45 PM  Result Value Ref Range   WBC 9.8 4.0 - 10.5 K/uL   RBC 4.25 3.87 - 5.11 MIL/uL   Hemoglobin 10.9 (L) 12.0 - 15.0 g/dL   HCT 35.3 (L) 36.0 - 46.0 %   MCV 83.1 78.0 - 100.0 fL   MCH 25.6 (L) 26.0 - 34.0 pg   MCHC 30.9 30.0 - 36.0 g/dL   RDW 16.0 (H) 11.5 - 15.5 %   Platelets 318 150 - 400 K/uL  Brain natriuretic peptide     Status: Abnormal   Collection Time: 07/29/15  1:45 PM  Result Value Ref Range   B Natriuretic Peptide 333.2 (H) 0.0 - 100.0 pg/mL  I-stat troponin, ED (not at [facility], [facility])     Status: None   Collection Time: 07/29/15  1:55 PM  Result Value Ref Range   Troponin i, poc 0.01 0.00 - 0.08 ng/mL   Comment 3            Comment: Due to the release kinetics of cTnI, a negative result within the first hours of the onset of symptoms does not rule out myocardial infarction with certainty. If myocardial infarction is still suspected, repeat the test at appropriate intervals.   Glucose, capillary     Status: Abnormal   Collection Time: 07/29/15   5:13 PM  Result Value Ref Range   Glucose-Capillary 152 (H) 65 - 99 mg/dL   Comment 1 Capillary  Specimen   Troponin I-serum (0, 3, 6 hours)     Status: None   Collection Time: 07/29/15  5:15 PM  Result Value Ref Range   Troponin I <0.03 <0.031 ng/mL    Comment:        NO INDICATION OF MYOCARDIAL INJURY.   TSH     Status: Abnormal   Collection Time: 07/29/15  5:15 PM  Result Value Ref Range   TSH 13.893 (H) 0.350 - 4.500 uIU/mL  MRSA PCR Screening     Status: None   Collection Time: 07/29/15  6:48 PM  Result Value Ref Range   MRSA by PCR NEGATIVE NEGATIVE    Comment:        The GeneXpert MRSA Assay (FDA approved for NASAL specimens only), is one component of a comprehensive MRSA colonization surveillance program. It is not intended to diagnose MRSA infection nor to guide or monitor treatment for MRSA infections.   Troponin I-serum (0, 3, 6 hours)     Status: None   Collection Time: 07/29/15  7:45 PM  Result Value Ref Range   Troponin I <0.03 <0.031 ng/mL    Comment:        NO INDICATION OF MYOCARDIAL INJURY.   Urine rapid drug screen (hosp performed)     Status: Abnormal   Collection Time: 07/29/15  7:48 PM  Result Value Ref Range   Opiates POSITIVE (A) NONE DETECTED   Cocaine NONE DETECTED NONE DETECTED   Benzodiazepines NONE DETECTED NONE DETECTED   Amphetamines NONE DETECTED NONE DETECTED   Tetrahydrocannabinol NONE DETECTED NONE DETECTED   Barbiturates NONE DETECTED NONE DETECTED    Comment:        DRUG SCREEN FOR MEDICAL PURPOSES ONLY.  IF CONFIRMATION IS NEEDED FOR ANY PURPOSE, NOTIFY LAB WITHIN 5 DAYS.        LOWEST DETECTABLE LIMITS FOR URINE DRUG SCREEN Drug Class       Cutoff (ng/mL) Amphetamine      1000 Barbiturate      200 Benzodiazepine   101 Tricyclics       751 Opiates          300 Cocaine          300 THC              50   Urinalysis, Routine w reflex microscopic (not at [facility])     Status: Abnormal   Collection Time: 07/29/15  7:48 PM    Result Value Ref Range   Color, Urine YELLOW YELLOW   APPearance CLEAR CLEAR   Specific Gravity, Urine 1.012 1.005 - 1.030   pH 6.5 5.0 - 8.0   Glucose, UA 500 (A) NEGATIVE mg/dL   Hgb urine dipstick NEGATIVE NEGATIVE   Bilirubin Urine NEGATIVE NEGATIVE   Ketones, ur NEGATIVE NEGATIVE mg/dL   Protein, ur NEGATIVE NEGATIVE mg/dL   Urobilinogen, UA 0.2 0.0 - 1.0 mg/dL   Nitrite NEGATIVE NEGATIVE   Leukocytes, UA TRACE (A) NEGATIVE  Urine microscopic-add on     Status: Abnormal   Collection Time: 07/29/15  7:48 PM  Result Value Ref Range   Squamous Epithelial / LPF FEW (A) RARE   WBC, UA 0-2 <3 WBC/hpf  Glucose, capillary     Status: Abnormal   Collection Time: 07/29/15  9:19 PM  Result Value Ref Range   Glucose-Capillary 113 (H) 65 - 99 mg/dL   Comment 1 Capillary Specimen   Troponin I-serum (0, 3, 6 hours)     Status: None  Collection Time: 07/29/15 10:47 PM  Result Value Ref Range   Troponin I <0.03 <0.031 ng/mL    Comment:        NO INDICATION OF MYOCARDIAL INJURY.   Glucose, capillary     Status: None   Collection Time: 07/30/15  8:01 AM  Result Value Ref Range   Glucose-Capillary 92 65 - 99 mg/dL   Comment 1 Capillary Specimen   Basic metabolic panel     Status: Abnormal   Collection Time: 07/30/15 12:27 PM  Result Value Ref Range   Sodium 139 135 - 145 mmol/L   Potassium 3.9 3.5 - 5.1 mmol/L   Chloride 100 (L) 101 - 111 mmol/L   CO2 32 22 - 32 mmol/L   Glucose, Bld 258 (H) 65 - 99 mg/dL   BUN 19 6 - 20 mg/dL   Creatinine, Ser 1.12 (H) 0.44 - 1.00 mg/dL   Calcium 8.6 (L) 8.9 - 10.3 mg/dL   GFR calc non Af Amer 54 (L) >60 mL/min   GFR calc Af Amer >60 >60 mL/min    Comment: (NOTE) The eGFR has been calculated using the CKD EPI equation. This calculation has not been validated in all clinical situations. eGFR's persistently <60 mL/min signify possible Chronic Kidney Disease.    Anion gap 7 5 - 15  Glucose, capillary     Status: Abnormal   Collection Time:  07/30/15 12:37 PM  Result Value Ref Range   Glucose-Capillary 214 (H) 65 - 99 mg/dL   Comment 1 Capillary Specimen   Glucose, capillary     Status: Abnormal   Collection Time: 07/30/15  4:47 PM  Result Value Ref Range   Glucose-Capillary 202 (H) 65 - 99 mg/dL   Comment 1 Capillary Specimen   Culture, Urine     Status: None (Preliminary result)   Collection Time: 07/30/15  5:19 PM  Result Value Ref Range   Specimen Description URINE, CLEAN CATCH    Special Requests NONE    Culture TOO YOUNG TO READ    Report Status PENDING   Glucose, capillary     Status: Abnormal   Collection Time: 07/30/15  9:58 PM  Result Value Ref Range   Glucose-Capillary 163 (H) 65 - 99 mg/dL   Comment 1 Capillary Specimen   Comprehensive metabolic panel     Status: Abnormal   Collection Time: 07/31/15  2:22 AM  Result Value Ref Range   Sodium 139 135 - 145 mmol/L   Potassium 4.0 3.5 - 5.1 mmol/L   Chloride 100 (L) 101 - 111 mmol/L   CO2 30 22 - 32 mmol/L   Glucose, Bld 132 (H) 65 - 99 mg/dL   BUN 22 (H) 6 - 20 mg/dL   Creatinine, Ser 0.95 0.44 - 1.00 mg/dL   Calcium 8.5 (L) 8.9 - 10.3 mg/dL   Total Protein 5.7 (L) 6.5 - 8.1 g/dL   Albumin 3.1 (L) 3.5 - 5.0 g/dL   AST 14 (L) 15 - 41 U/L   ALT 9 (L) 14 - 54 U/L   Alkaline Phosphatase 90 38 - 126 U/L   Total Bilirubin 0.9 0.3 - 1.2 mg/dL   GFR calc non Af Amer >60 >60 mL/min   GFR calc Af Amer >60 >60 mL/min    Comment: (NOTE) The eGFR has been calculated using the CKD EPI equation. This calculation has not been validated in all clinical situations. eGFR's persistently <60 mL/min signify possible Chronic Kidney Disease.    Anion gap 9 5 -  15  CBC with Differential/Platelet     Status: Abnormal   Collection Time: 07/31/15  2:22 AM  Result Value Ref Range   WBC 8.8 4.0 - 10.5 K/uL   RBC 4.11 3.87 - 5.11 MIL/uL   Hemoglobin 11.0 (L) 12.0 - 15.0 g/dL   HCT 34.6 (L) 36.0 - 46.0 %   MCV 84.2 78.0 - 100.0 fL   MCH 26.8 26.0 - 34.0 pg   MCHC 31.8  30.0 - 36.0 g/dL   RDW 16.5 (H) 11.5 - 15.5 %   Platelets 263 150 - 400 K/uL    Comment: SPECIMEN CHECKED FOR CLOTS REPEATED TO VERIFY PLATELET COUNT CONFIRMED BY SMEAR LARGE PLATELETS PRESENT    Neutrophils Relative % 72 %   Neutro Abs 6.4 1.7 - 7.7 K/uL   Lymphocytes Relative 17 %   Lymphs Abs 1.5 0.7 - 4.0 K/uL   Monocytes Relative 7 %   Monocytes Absolute 0.6 0.1 - 1.0 K/uL   Eosinophils Relative 4 %   Eosinophils Absolute 0.4 0.0 - 0.7 K/uL   Basophils Relative 1 %   Basophils Absolute 0.0 0.0 - 0.1 K/uL  Magnesium     Status: None   Collection Time: 07/31/15  2:22 AM  Result Value Ref Range   Magnesium 2.1 1.7 - 2.4 mg/dL    Imaging: Dg Chest 2 View  07/29/2015  CLINICAL DATA:  54 year old female with central chest pain for the past 4 hours. Shortness of breath for the past 3 days. EXAM: CHEST  2 VIEW COMPARISON:  Chest x-ray 04/16/2015. FINDINGS: There is cephalization of the pulmonary vasculature and slight indistinctness of the interstitial markings suggestive of mild pulmonary edema. No pleural effusions. Mild cardiomegaly. The patient is rotated to the left on today's exam, resulting in distortion of the mediastinal contours and reduced diagnostic sensitivity and specificity for mediastinal pathology. Orthopedic fixation hardware throughout the cervical spine. Status post left shoulder arthroplasty. Posttraumatic deformity of the right proximal humerus with heterotopic ossification extending cephalad from the humeral head. IMPRESSION: 1. The appearance of the chest suggests mild congestive heart failure, as above. Electronically Signed   By: Vinnie Langton M.D.   On: 07/29/2015 14:44    Assessment:  1. Principal Problem: 2.   Anginal chest pain at rest [facility]) 3. Active Problems: 4.   Depression 5.   HTN (hypertension) 6.   COPD (chronic obstructive pulmonary disease) ([place]) 7.   Mitral stenosis 8.   Shoulder fracture, right 9.   Generalized anxiety disorder 10.    Type 2 diabetes mellitus with hyperglycemia (HCC) 11.   Hypothyroidism 12.   GERD without esophagitis 13.   Chest pain at rest 14.   Bipolar I disorder ([place]) 15.   Agoraphobia 16.   Plan:  Breathing appears to be at baseline today. She wants to go home. Will restart home po lasix 40 mg daily. Outpatient follow-up with Dr. Mare Ferrari or his designated cardiologist given his pending retirement.  Time Spent Directly with Patient:  15 minutes  Length of Stay:  LOS: 2 days   Pixie Casino, MD, [facility] Attending Cardiologist [place] 07/31/2015, 8:14 AM

## 2015-07-31 NOTE — Discharge Summary (Signed)
DISCHARGE SUMMARY  Elizabeth BuryMary J [name]  MR#: [id number]  DOB:[birth year]  Date of Admission: 07/29/2015 Date of Discharge: 07/31/2015  Attending Physician:MCCLUNG,JEFFREY T  Patient's WUJ:WJXBJYN,WGNFAPCP:[name],Elizabeth A, MD  Consults:  [facility]  Disposition: D/C home   Follow-up Appts:     Follow-up Information    Follow up with [name],Elizabeth A, MD In 1 week.   Specialty:  Internal Medicine   Contact information:   [address] [place] [address] [phone]       Follow up with Ronny FlurryBrackbill, Tom, MD In 1 week.   Specialty:  Cardiology   Contact information:   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 She described the pain as jabbing in nature and just to the right of her sternum w/ radiation to the left. It was intermittent but lasted for 3-4 hours at a time.   The patient had an admission in July 2016 during which she was treated for diastolic heart failure. She also had a right arm fracture. She was scheduled to see [facility] for preoperative clearance for orthopedic surgery on her right arm.  Hospital Course:  Chest pain at rest -Uncertain etiology - ?musculoskeletal related to R arm fx  -resolved  -Troponins 3 negative  -EKG no significant findings  -continue aspirin daily (tolerted during hospital stay)  Acute on chronic diastolic heart failure -Most recent 2-D echo shows an LVEF of 65-70% with severe mitral valve stenosis  -Diuretics per  Cardiology -net negative 2250 recorded out during this admit  -no dyspnea when laying flat prior to d/c   COPD -Stable - no wheezing on day of d/c  -On 2 L of oxygen at home PRN  Hypothyroidism -TSH 13.8 - pt states taking all meds but I'm not 100% convinced.  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     ALPRAZolam 0.5 MG tablet  Commonly known as:  XANAX  Take 1 tablet (0.5 mg total) by mouth 2 (two) times daily as needed for anxiety.     ARIPiprazole 5 MG tablet  Commonly known as:  ABILIFY  Take 5 mg by mouth daily.     aspirin EC 81 MG tablet  Take 1 tablet (81 mg total) by mouth daily.     buPROPion 300 MG 24 hr tablet  Commonly known as:  WELLBUTRIN XL  Take 300 mg by mouth daily.     cetirizine 10 MG tablet  Commonly known as:  ZYRTEC  Take 10 mg by mouth daily.     furosemide 40 MG tablet  Commonly known as:  LASIX  Take 1 tablet (40 mg total)  by mouth daily.     gabapentin 300 MG capsule  Commonly known as:  NEURONTIN  Take 300 mg by mouth 3 (three) times daily.     glimepiride 4 MG tablet  Commonly known as:  AMARYL  Take 4 mg by mouth daily.     HYDROcodone-acetaminophen 5-325 MG tablet  Commonly known as:  NORCO/VICODIN  1-2  By mouth mouth every 4 hours as needed for pain NOT TO EXCEED 3 GM OF APAP FROM ALL SOURCES     insulin glargine 100 unit/mL Sopn  Commonly known as:  LANTUS  Inject 50 Units into the skin at bedtime.     INVOKANA 100 MG Tabs tablet  Generic drug:  canagliflozin  Take 100 mg by mouth daily.     levothyroxine 100 MCG tablet   Commonly known as:  SYNTHROID, LEVOTHROID  Take 1 tablet (100 mcg total) by mouth daily before breakfast.     losartan 100 MG tablet  Commonly known as:  COZAAR  Take 100 mg by mouth daily.     methocarbamol 500 MG tablet  Commonly known as:  ROBAXIN  Take 500 mg by mouth 2 (two) times daily as needed for muscle spasms.     metoprolol tartrate 25 MG tablet  Commonly known as:  LOPRESSOR  Take 0.5 tablets (12.5 mg total) by mouth 2 (two) times daily.     NOVOLOG FLEXPEN 100 UNIT/ML FlexPen  Generic drug:  insulin aspart  Inject 6-15 Units into the skin 3 (three) times daily as needed for high blood sugar (CBG >100). CBG 100-200 6-8 units, >200 15 units     omeprazole 20 MG capsule  Commonly known as:  PRILOSEC  Take 20 mg by mouth daily.     potassium chloride SA 20 MEQ tablet  Commonly known as:  K-DUR,KLOR-CON  Take 1 tablet (20 mEq total) by mouth daily.     PROAIR HFA 108 (90 BASE) MCG/ACT inhaler  Generic drug:  albuterol  Inhale 1 puff into the lungs 2 (two) times daily as needed for wheezing or shortness of breath.     simvastatin 10 MG tablet  Commonly known as:  ZOCOR  Take 10 mg by mouth daily.        Day of Discharge BP 103/69 mmHg  Pulse 77  Temp(Src) 97.7 F (36.5 C) (Oral)  Resp 16  Ht  (1.549 m)  Wt 94.8 kg (208 lb 15.9 oz)  BMI 39.51 kg/m2  SpO2 96%  Physical Exam: General: No acute respiratory distress Lungs: Clear to auscultation bilaterally without wheezes or crackles Cardiovascular: Regular rate and rhythm without murmur gallop or rub normal S1 and S2 Abdomen: Nontender, nondistended, soft, bowel sounds positive, no rebound, no ascites, no appreciable mass Extremities: No significant cyanosis, clubbing, or edema bilateral lower extremities  Basic Metabolic Panel:  Recent Labs Lab 07/29/15 1345 07/30/15 1227 07/31/15 0222  NA 140 139 139  K 3.5 3.9 4.0  CL 102 100* 100*  CO2 30 32 30  GLUCOSE 158* 258* 132*  BUN 16 19 22*   CREATININE 1.04* 1.12* 0.95  CALCIUM 9.0 8.6* 8.5*  MG  --   --  2.1    Liver Function Tests:  Recent Labs Lab 07/31/15 0222  AST 14*  ALT 9*  ALKPHOS 90  BILITOT 0.9  PROT 5.7*  ALBUMIN 3.1*   CBC:  Recent Labs Lab 07/29/15 1345 07/31/15 0222  WBC 9.8 8.8  NEUTROABS  --  6.4  HGB 10.9*  11.0*  HCT 35.3* 34.6*  MCV 83.1 84.2  PLT 318 263    Cardiac Enzymes:  Recent Labs Lab 07/29/15 1715 07/29/15 1945 07/29/15 2247  TROPONINI <0.03 <0.03 <0.03   BNP (last 3 results)  Recent Labs  04/14/15 1400 07/29/15 1345  BNP 105.9* 333.2*    CBG:  Recent Labs Lab 07/30/15 1237 07/30/15 1647 07/30/15 2158 07/31/15 0725 07/31/15 1143  GLUCAP 214* 202* 163* 157* 164*    Recent Results (from the past 240 hour(s))  MRSA PCR Screening     Status: None   Collection Time: 07/29/15  6:48 PM  Result Value Ref Range Status   MRSA by PCR NEGATIVE NEGATIVE Final    Comment:        The GeneXpert MRSA Assay (FDA approved for NASAL specimens only), is one component of a comprehensive MRSA colonization surveillance program. It is not intended to diagnose MRSA infection nor to guide or monitor treatment for MRSA infections.   Culture, Urine     Status: None (Preliminary result)   Collection Time: 07/30/15  5:19 PM  Result Value Ref Range Status   Specimen Description URINE, CLEAN CATCH  Final   Special Requests NONE  Final   Culture TOO YOUNG TO READ  Final   Report Status PENDING  Incomplete    Time spent in discharge (includes decision making & examination of pt): >35 minutes  07/31/2015, 12:19 PM   Lonia Blood, MD [facility] Office  [phone] Pager [phone]  On-Call/Text Page:      [email or web address]      password [facility]

## 2015-08-07 ENCOUNTER — Encounter: Payer: Self-pay | Admitting: Physician Assistant

## 2015-08-07 ENCOUNTER — Ambulatory Visit (INDEPENDENT_AMBULATORY_CARE_PROVIDER_SITE_OTHER): Payer: Medicaid Other | Admitting: Physician Assistant

## 2015-08-07 VITALS — BP 110/60 | HR 57 | Ht 61.0 in | Wt 214.0 lb

## 2015-08-07 DIAGNOSIS — E038 Other specified hypothyroidism: Secondary | ICD-10-CM

## 2015-08-07 DIAGNOSIS — Z01818 Encounter for other preprocedural examination: Secondary | ICD-10-CM

## 2015-08-07 DIAGNOSIS — R079 Chest pain, unspecified: Secondary | ICD-10-CM | POA: Diagnosis not present

## 2015-08-07 DIAGNOSIS — R0789 Other chest pain: Secondary | ICD-10-CM

## 2015-08-07 MED ORDER — FUROSEMIDE 40 MG PO TABS: ORAL_TABLET | ORAL | Status: DC

## 2015-08-07 MED ORDER — POTASSIUM CHLORIDE CRYS ER 20 MEQ PO TBCR: EXTENDED_RELEASE_TABLET | ORAL | Status: DC

## 2015-08-07 NOTE — Assessment & Plan Note (Signed)
Her TSH was 13 in the hospital. Her Synthroid was increased to 100 micro-grams daily. Follow-up with primary care

## 2015-08-07 NOTE — Assessment & Plan Note (Signed)
Patient has extra fluid on board today. Her weight is 214 on our scales. It was 208 at discharge. Will increase Lasix to 40 mg twice a day for 3 days potassium to 20 mEq twice a day for 3 days then back to once daily both drugs. Check 2-D echo.

## 2015-08-07 NOTE — Assessment & Plan Note (Signed)
Pressure stable 

## 2015-08-07 NOTE — Assessment & Plan Note (Signed)
Patient has history of severe mitral stenosis on 2-D echo in 2014 with a mean gradient 14 mmHg. However pressure was half time 2.1 cm suggestive of mild MS. We'll repeat 2-D echo.

## 2015-08-07 NOTE — Assessment & Plan Note (Addendum)
Patient's requesting preoperative clearance before undergoing right arm surgery for a nonhealing break. She had recent hospitalization with chest pain and acute on chronic respiratory failure and diastolic heart failure. Troponins were negative 3. Reports having a heart attack back in 2007 and last stress test in 2014 EF 73% with breast attenuation no scar or ischemia. With her recurrent chest pain and previous history, recommend Lexi scan Myoview before undergoing surgery. Also needs to follow-up with Dr. Patty SermonsBrackbill.

## 2015-08-07 NOTE — Addendum Note (Signed)
Addended by: Demetrios LollVIA, PATRICIA M on: 08/07/2015 03:15 PM   Modules accepted: Orders

## 2015-08-07 NOTE — Patient Instructions (Signed)
Medication Instructions:  Increase Furosemide to 40 mg twice daily for 3 days then decrease back to once daily. Increase Potassium to 20 meq twice daily for 3 days then decrease back to once daily.  Labwork: None  Testing/Procedures: Your physician has requested that you have a lexiscan myoview. For further information please visit [email or web address] Please follow instruction sheet, as given.  Your physician has requested that you have an echocardiogram. Echocardiography is a painless test that uses sound waves to create images of your heart. It provides your doctor with information about the size and shape of your heart and how well your heart's chambers and valves are working. This procedure takes approximately one hour. There are no restrictions for this procedure.   Follow-Up: Your physician recommends that you schedule a follow-up appointment in: 1 month with Dr Patty SermonsBrackbill.   Any Other Special Instructions Will Be Listed Below (If Applicable).     If you need a refill on your cardiac medications before your next appointment, please call your pharmacy.
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
## 2015-08-15 ENCOUNTER — Telehealth (HOSPITAL_COMMUNITY): Payer: Self-pay

## 2015-08-15 NOTE — Telephone Encounter (Signed)
Patient given detailed instructions per Myocardial Perfusion Study Information Sheet for the test on 08-22-2015 at 1000. Patient notified to arrive at 0815 for echo, and that it is imperative to arrive on time for appointment to keep from having the test rescheduled.  If you need to cancel or reschedule your appointment, please call the office within 24 hours of your appointment. Failure to do so may result in a cancellation of your appointment, and a $50 no show fee. Patient verbalized understanding.Elizabeth EvensEdwards, Genny Caulder A

## 2015-08-22 ENCOUNTER — Other Ambulatory Visit: Payer: Self-pay

## 2015-08-22 ENCOUNTER — Ambulatory Visit (HOSPITAL_COMMUNITY): Payer: Medicaid Other | Attending: Cardiovascular Disease

## 2015-08-22 ENCOUNTER — Other Ambulatory Visit (HOSPITAL_COMMUNITY): Payer: Medicaid Other | Admitting: *Deleted

## 2015-08-22 ENCOUNTER — Ambulatory Visit (HOSPITAL_BASED_OUTPATIENT_CLINIC_OR_DEPARTMENT_OTHER): Payer: Medicaid Other

## 2015-08-22 DIAGNOSIS — Z01818 Encounter for other preprocedural examination: Secondary | ICD-10-CM

## 2015-08-22 DIAGNOSIS — I050 Rheumatic mitral stenosis: Secondary | ICD-10-CM | POA: Diagnosis not present

## 2015-08-22 DIAGNOSIS — R0609 Other forms of dyspnea: Secondary | ICD-10-CM | POA: Insufficient documentation

## 2015-08-22 DIAGNOSIS — R0789 Other chest pain: Secondary | ICD-10-CM | POA: Diagnosis not present

## 2015-08-22 DIAGNOSIS — Z87891 Personal history of nicotine dependence: Secondary | ICD-10-CM | POA: Diagnosis not present

## 2015-08-22 DIAGNOSIS — R079 Chest pain, unspecified: Secondary | ICD-10-CM

## 2015-08-22 DIAGNOSIS — E119 Type 2 diabetes mellitus without complications: Secondary | ICD-10-CM | POA: Diagnosis not present

## 2015-08-22 DIAGNOSIS — I10 Essential (primary) hypertension: Secondary | ICD-10-CM | POA: Diagnosis not present

## 2015-08-22 DIAGNOSIS — R0602 Shortness of breath: Secondary | ICD-10-CM | POA: Insufficient documentation

## 2015-08-22 DIAGNOSIS — R002 Palpitations: Secondary | ICD-10-CM | POA: Diagnosis not present

## 2015-08-22 LAB — MYOCARDIAL PERFUSION IMAGING
LV dias vol: 58 mL
LV sys vol: 7 mL
Peak HR: 77 {beats}/min
RATE: 0.32
Rest HR: 63 {beats}/min
SDS: 4
SRS: 7
SSS: 11
TID: 0.96

## 2015-08-22 MED ORDER — TECHNETIUM TC 99M SESTAMIBI GENERIC - CARDIOLITE
32.7000 | Freq: Once | INTRAVENOUS | Status: AC | PRN
  Administered 2015-08-22: 32.7 via INTRAVENOUS

## 2015-08-22 MED ORDER — PERFLUTREN LIPID MICROSPHERE
1.0000 mL | INTRAVENOUS | Status: AC | PRN
  Administered 2015-08-22: 2 mL via INTRAVENOUS

## 2015-08-22 MED ORDER — TECHNETIUM TC 99M SESTAMIBI GENERIC - CARDIOLITE
10.5000 | Freq: Once | INTRAVENOUS | Status: AC | PRN
  Administered 2015-08-22: 11 via INTRAVENOUS

## 2015-08-22 MED ORDER — REGADENOSON 0.4 MG/5ML IV SOLN
0.4000 mg | Freq: Once | INTRAVENOUS | Status: AC
  Administered 2015-08-22: 0.4 mg via INTRAVENOUS

## 2015-09-07 ENCOUNTER — Encounter: Payer: Self-pay | Admitting: Cardiology

## 2015-09-07 ENCOUNTER — Ambulatory Visit (INDEPENDENT_AMBULATORY_CARE_PROVIDER_SITE_OTHER): Payer: Medicaid Other | Admitting: Cardiology

## 2015-09-07 VITALS — BP 130/60 | HR 77 | Ht 62.0 in | Wt 216.0 lb

## 2015-09-07 DIAGNOSIS — Z01818 Encounter for other preprocedural examination: Secondary | ICD-10-CM | POA: Diagnosis not present

## 2015-09-07 DIAGNOSIS — I050 Rheumatic mitral stenosis: Secondary | ICD-10-CM
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
## 2015-09-07 NOTE — Patient Instructions (Signed)
Medication Instructions:  Your physician recommends that you continue on your current medications as directed. Please refer to the Current Medication list given to you today.  Labwork: NONE  Testing/Procedures: NONE  Follow-Up: Keep your scheduled appointment in April  Any Other Special Instructions Will Be Listed Below (If Applicable) You are cleared for surgery from a cardiac standpoint   If you need a refill on your cardiac medications before your next appointment, please call your pharmacy.

## 2015-09-08 ENCOUNTER — Other Ambulatory Visit (HOSPITAL_COMMUNITY): Payer: Self-pay | Admitting: Orthopedic Surgery

## 2015-10-04 ENCOUNTER — Encounter (HOSPITAL_COMMUNITY)
Admission: RE | Admit: 2015-10-04 | Discharge: 2015-10-04 | Disposition: A | Discharge status: Home / Self Care | Payer: Medicaid Other | Source: Ambulatory Visit | Attending: Orthopedic Surgery | Admitting: Orthopedic Surgery

## 2015-10-04 ENCOUNTER — Encounter (HOSPITAL_COMMUNITY): Payer: Self-pay

## 2015-10-04 DIAGNOSIS — I110 Hypertensive heart disease with heart failure: Secondary | ICD-10-CM | POA: Diagnosis not present

## 2015-10-04 DIAGNOSIS — J449 Chronic obstructive pulmonary disease, unspecified: Secondary | ICD-10-CM | POA: Diagnosis not present

## 2015-10-04 DIAGNOSIS — Z01812 Encounter for preprocedural laboratory examination: Secondary | ICD-10-CM | POA: Insufficient documentation

## 2015-10-04 DIAGNOSIS — Z01818 Encounter for other preprocedural examination: Secondary | ICD-10-CM | POA: Diagnosis not present

## 2015-10-04 DIAGNOSIS — S42201K Unspecified fracture of upper end of right humerus, subsequent encounter for fracture with nonunion: Secondary | ICD-10-CM | POA: Insufficient documentation

## 2015-10-04 DIAGNOSIS — I509 Heart failure, unspecified: Secondary | ICD-10-CM | POA: Diagnosis not present

## 2015-10-04 DIAGNOSIS — E119 Type 2 diabetes mellitus without complications: Secondary | ICD-10-CM | POA: Insufficient documentation

## 2015-10-04 DIAGNOSIS — Z79899 Other long term (current) drug therapy: Secondary | ICD-10-CM | POA: Insufficient documentation

## 2015-10-04 DIAGNOSIS — K219 Gastro-esophageal reflux disease without esophagitis: Secondary | ICD-10-CM | POA: Insufficient documentation

## 2015-10-04 DIAGNOSIS — Z87891 Personal history of nicotine dependence: Secondary | ICD-10-CM | POA: Diagnosis not present

## 2015-10-04 DIAGNOSIS — Z794 Long term (current) use of insulin: Secondary | ICD-10-CM | POA: Diagnosis not present

## 2015-10-04 DIAGNOSIS — X58XXXD Exposure to other specified factors, subsequent encounter: Secondary | ICD-10-CM | POA: Insufficient documentation

## 2015-10-04 DIAGNOSIS — E785 Hyperlipidemia, unspecified: Secondary | ICD-10-CM | POA: Insufficient documentation

## 2015-10-04 DIAGNOSIS — Z7982 Long term (current) use of aspirin: Secondary | ICD-10-CM | POA: Diagnosis not present

## 2015-10-04 HISTORY — DX: Nausea with vomiting, unspecified: Z98.890

## 2015-10-04 HISTORY — DX: Unspecified convulsions: R56.9

## 2015-10-04 HISTORY — DX: Heart failure, unspecified: I50.9

## 2015-10-04 HISTORY — DX: Other specified postprocedural states: R11.2

## 2015-10-04 LAB — COMPREHENSIVE METABOLIC PANEL
ALBUMIN: 3.8 g/dL (ref 3.5–5.0)
ALT: 11 U/L — ABNORMAL LOW (ref 14–54)
AST: 18 U/L (ref 15–41)
Alkaline Phosphatase: 122 U/L (ref 38–126)
Anion gap: 13 (ref 5–15)
BUN: 22 mg/dL — AB (ref 6–20)
CHLORIDE: 98 mmol/L — AB (ref 101–111)
CO2: 28 mmol/L (ref 22–32)
Calcium: 9.7 mg/dL (ref 8.9–10.3)
Creatinine, Ser: 1.09 mg/dL — ABNORMAL HIGH (ref 0.44–1.00)
GFR calc Af Amer: >60 mL/min (ref >60)
GFR calc non Af Amer: 56 mL/min — ABNORMAL LOW (ref >60)
GLUCOSE: 228 mg/dL — AB (ref 65–99)
POTASSIUM: 3.6 mmol/L (ref 3.5–5.1)
SODIUM: 139 mmol/L (ref 135–145)
Total Bilirubin: 0.8 mg/dL (ref 0.3–1.2)
Total Protein: 7.1 g/dL (ref 6.5–8.1)

## 2015-10-04 LAB — CBC
HCT: 43.2 % (ref 36.0–46.0)
Hemoglobin: 14 g/dL (ref 12.0–15.0)
MCH: 26.7 pg (ref 26.0–34.0)
MCHC: 32.4 g/dL (ref 30.0–36.0)
MCV: 82.4 fL (ref 78.0–100.0)
PLATELETS: 335 10*3/uL (ref 150–400)
RBC: 5.24 MIL/uL — ABNORMAL HIGH (ref 3.87–5.11)
RDW: 16.8 % — AB (ref 11.5–15.5)
WBC: 7.3 10*3/uL (ref 4.0–10.5)

## 2015-10-04 LAB — GLUCOSE, CAPILLARY: GLUCOSE-CAPILLARY: 215 mg/dL — AB (ref 65–99)

## 2015-10-04 NOTE — Progress Notes (Addendum)
Anesthesia Chart Review:  Pt is 55 year old female scheduled for R proximal humerus fracture nonunion repair on 10/12/2015 with Dr. August Saucerean.   Cardiologist is Dr. Cassell Clementhomas Brackbill, last office visit 09/07/15. PCP is Dr. Fleet ContrasEdwin Avbuere.   PMH includes:  CHF, heart murmur, HTN, hyperlipidemia, anemia, DM, COPD, emphysema, uses home oxygen prn, seizures (as a child), GERD, post-op N/V. Former smoker. BMI 38.   Medications include: albuterol, abilify, ASA, canagliflozin, lasix, glimepiride, hctz, novolog, lantus, levothyroxine, losartan, metoprolol, prilosec, potassium, simvastatin.   Preoperative labs reviewed.  Glucose 228, hgbA1c 8.3.   Chest x-ray 07/29/15 reviewed. The appearance of the chest suggests mild congestive heart failure  EKG 08/07/15: sinus bradycardia (57 bpm). Low voltage QRS. Cannot rule out anterior infarct, age undetermined.   Nuclear stress test 08/22/15: Normal stress nuclear study with no ischemia or infarction; EF 89 with normal wall motion.  Echo 08/22/15: - Left ventricle: The cavity size was normal. Systolic function was normal. The estimated ejection fraction was in the range of 60% to 65%. Wall motion was normal; there were no regional wall motion abnormalities. Mitral stenosis prevents evaluation of LV diastolic function. - Mitral valve: The findings are consistent with moderate to severe stenosis. Mean gradient (D): 12 mm Hg. Peak gradient (D): 17 mm Hg.  Pt has cardiac clearance from Dr. Patty SermonsBrackbill in Epic note dated 09/07/15, which notes "because of her mitral stenosis, one should avoid excessive IV fluids in the perioperative period."  If no changes, I anticipate pt can proceed with surgery as scheduled.   Rica Mastngela Kabbe, FNP-BC [facility]Anesthesiology Phone: [phone] 10/05/2015 1:24 PM

## 2015-10-04 NOTE — Pre-Procedure Instructions (Signed)
Evaristo BuryMary J Boedecker  10/04/2015     Your procedure is scheduled on : Thursday October 12, 2015 at 3:09 PM.  [facility]:00 PM.  Call this number if you have problems the morning of surgery: [phone]    Remember:  Do not eat food or drink liquids after midnight.  Take these medicines the morning of surgery with A SIP OF WATER : Acetaminophen (Tylenol) if needed, Albuterol inhaler IF needed (bring inhaler with you), Aripiprazole (Abilify), Bupropion (Wellbutrin), Cetirizine (Zyrtec), Gabapentin (Neurontin), Levothyroxine (Synthroid), Metoprolol (Lopressor), Omeprazole (Prilosec), Hydrocodone, Xanax   Stop taking any vitamins, herbal medications, Meloxicam/Mobic, Ibuprofen, Advil, Motrin, Aleve, etc on Thursday January 12th   Do NOT take any diabetic pills the morning of your surgery (NO Invokana, NO Glimepiride/Amaryl)   How to Manage Your Diabetes Before Surgery   Why is it important to control my blood sugar before and after surgery?   Improving blood sugar levels before and after surgery helps healing and can limit problems.  A way of improving blood sugar control is eating a healthy diet by:  - Eating less sugar and carbohydrates  - Increasing activity/exercise  - Talk with your doctor about reaching your blood sugar goals  High blood sugars (greater than 180 mg/dL) can raise your risk of infections and slow down your recovery so you will need to focus on controlling your diabetes during the weeks before surgery.  Make sure that the doctor who takes care of your diabetes knows about your planned surgery including the date and location.  How do I manage my blood sugars before surgery?   Check your blood sugar at least 4 times a day, 2 days before surgery to make sure that they are not too high or low.   Check your blood sugar the morning of your surgery when you wake up and every 2 hours until you get to the Short-Stay unit.  If your blood  sugar is less than 70 mg/dL, you will need to treat for low blood sugar by:  Treat a low blood sugar (less than 70 mg/dL) with 1/2 cup of clear juice (cranberry or apple), 4 glucose tablets, OR glucose gel.  Recheck blood sugar in 15 minutes after treatment (to make sure it is greater than 70 mg/dL).  If blood sugar is not greater than 70 mg/dL on re-check, call [phone] for further instructions.   Report your blood sugar to the Short-Stay nurse when you get to Short-Stay.  References:  [facility], 2007 "How to Manage your Diabetes Before and After Surgery".  What do I do about my diabetes medications?   Do not take oral diabetes medicines (pills) the morning of surgery.   THE NIGHT BEFORE SURGERY, take 48 units of Lantus Insulin.   f your CBG is greater than 220 mg/dL, you may take 1/2 of your sliding scale (correction) dose of insulin. Ex. If greater than 220 ONLY take 7 units of Novolog insulin. IF blood sugar is NOT greater than 220, DO NOT take any Novolog insulin the morning of your surgery   Do not wear jewelry, make-up or nail polish.  Do not wear lotions, powders, or perfumes.    Do not shave 48 hours prior to surgery.    Do not bring valuables to the hospital.  [facility] for any belongings or valuables.  Contacts, dentures or bridgework may not be worn into surgery.  Leave your suitcase in  the car.  After surgery it may be brought to your room.  For patients admitted to the hospital, discharge time will be determined by your treatment team.  Patients discharged the day of surgery will not be allowed to drive home.   Name and phone number of your driver:    Special instructions:  Shower using CHG soap the night before and the morning of your surgery  Please read over the following fact sheets that you were given. Pain Booklet, Coughing and Deep Breathing and Surgical Site Infection Prevention

## 2015-10-04 NOTE — Progress Notes (Signed)
PCP is Fleet ContrasEdwin Avbuere  Cardiologist is Cassell Clementhomas Brackbill  Patient arrived to PAT via wheelchair accompanied by her spouse Mr. [name]. Patient denied having any acute cardiac or pulmonary issues at time time.   Patients spouse informed Nurse that patients blood glucose was 58 this morning and he gave her a bowl cereal and some kool aid to drink. CBG on arrival to PAT was 215. Patient stated the highest her blood sugar has been was 404 and the lowest was 39. Nurse explained in extensive detail how to manage diabetes the night before and the morning of surgery. Both patient and spouse verbalized understanding, and teach back were completed by both parties.   Patient denied any recreational drug usage

## 2015-10-05 LAB — HEMOGLOBIN A1C
HEMOGLOBIN A1C: 8.3 % — AB (ref 4.8–5.6)
MEAN PLASMA GLUCOSE: 192 mg/dL

## 2015-10-12 ENCOUNTER — Ambulatory Visit (HOSPITAL_COMMUNITY): Payer: Medicaid Other | Admitting: Certified Registered Nurse Anesthetist

## 2015-10-12 ENCOUNTER — Observation Stay (HOSPITAL_COMMUNITY)
Admission: RE | Admit: 2015-10-12 | Discharge: 2015-10-14 | Disposition: A | Discharge status: Home / Self Care | Payer: Medicaid Other | Source: Ambulatory Visit | Attending: Orthopedic Surgery | Admitting: Orthopedic Surgery

## 2015-10-12 ENCOUNTER — Ambulatory Visit (HOSPITAL_COMMUNITY): Payer: Medicaid Other

## 2015-10-12 ENCOUNTER — Ambulatory Visit (HOSPITAL_COMMUNITY): Payer: Medicaid Other | Admitting: Emergency Medicine

## 2015-10-12 ENCOUNTER — Encounter (HOSPITAL_COMMUNITY): Payer: Self-pay | Admitting: *Deleted

## 2015-10-12 ENCOUNTER — Encounter (HOSPITAL_COMMUNITY)
Admission: RE | Disposition: A | Discharge status: Home / Self Care | Payer: Self-pay | Source: Ambulatory Visit | Attending: Orthopedic Surgery

## 2015-10-12 DIAGNOSIS — F319 Bipolar disorder, unspecified: Secondary | ICD-10-CM | POA: Diagnosis present

## 2015-10-12 DIAGNOSIS — I050 Rheumatic mitral stenosis: Secondary | ICD-10-CM | POA: Diagnosis not present

## 2015-10-12 DIAGNOSIS — E1140 Type 2 diabetes mellitus with diabetic neuropathy, unspecified: Secondary | ICD-10-CM | POA: Diagnosis not present

## 2015-10-12 DIAGNOSIS — I5032 Chronic diastolic (congestive) heart failure: Secondary | ICD-10-CM | POA: Diagnosis not present

## 2015-10-12 DIAGNOSIS — N182 Chronic kidney disease, stage 2 (mild): Secondary | ICD-10-CM | POA: Insufficient documentation

## 2015-10-12 DIAGNOSIS — J449 Chronic obstructive pulmonary disease, unspecified: Secondary | ICD-10-CM | POA: Diagnosis not present

## 2015-10-12 DIAGNOSIS — I129 Hypertensive chronic kidney disease with stage 1 through stage 4 chronic kidney disease, or unspecified chronic kidney disease: Secondary | ICD-10-CM | POA: Insufficient documentation

## 2015-10-12 DIAGNOSIS — Z794 Long term (current) use of insulin: Secondary | ICD-10-CM | POA: Diagnosis not present

## 2015-10-12 DIAGNOSIS — S42201A Unspecified fracture of upper end of right humerus, initial encounter for closed fracture: Secondary | ICD-10-CM | POA: Diagnosis not present

## 2015-10-12 DIAGNOSIS — I252 Old myocardial infarction: Secondary | ICD-10-CM | POA: Insufficient documentation

## 2015-10-12 DIAGNOSIS — Z6837 Body mass index (BMI) 37.0-37.9, adult: Secondary | ICD-10-CM | POA: Insufficient documentation

## 2015-10-12 DIAGNOSIS — Z9981 Dependence on supplemental oxygen: Secondary | ICD-10-CM | POA: Insufficient documentation

## 2015-10-12 DIAGNOSIS — E860 Dehydration: Secondary | ICD-10-CM | POA: Insufficient documentation

## 2015-10-12 DIAGNOSIS — E1129 Type 2 diabetes mellitus with other diabetic kidney complication: Secondary | ICD-10-CM | POA: Diagnosis present

## 2015-10-12 DIAGNOSIS — IMO0001 Reserved for inherently not codable concepts without codable children: Secondary | ICD-10-CM | POA: Diagnosis present

## 2015-10-12 DIAGNOSIS — E1122 Type 2 diabetes mellitus with diabetic chronic kidney disease: Secondary | ICD-10-CM | POA: Diagnosis not present

## 2015-10-12 DIAGNOSIS — X58XXXA Exposure to other specified factors, initial encounter: Secondary | ICD-10-CM | POA: Insufficient documentation

## 2015-10-12 DIAGNOSIS — M1990 Unspecified osteoarthritis, unspecified site: Secondary | ICD-10-CM | POA: Diagnosis not present

## 2015-10-12 DIAGNOSIS — N179 Acute kidney failure, unspecified: Secondary | ICD-10-CM | POA: Diagnosis present

## 2015-10-12 DIAGNOSIS — R531 Weakness: Secondary | ICD-10-CM | POA: Insufficient documentation

## 2015-10-12 DIAGNOSIS — E7800 Pure hypercholesterolemia, unspecified: Secondary | ICD-10-CM | POA: Diagnosis not present

## 2015-10-12 DIAGNOSIS — Z87891 Personal history of nicotine dependence: Secondary | ICD-10-CM | POA: Insufficient documentation

## 2015-10-12 DIAGNOSIS — Z419 Encounter for procedure for purposes other than remedying health state, unspecified: Secondary | ICD-10-CM

## 2015-10-12 DIAGNOSIS — E1165 Type 2 diabetes mellitus with hyperglycemia: Secondary | ICD-10-CM | POA: Diagnosis not present

## 2015-10-12 DIAGNOSIS — E039 Hypothyroidism, unspecified: Secondary | ICD-10-CM | POA: Diagnosis present

## 2015-10-12 DIAGNOSIS — S42309A Unspecified fracture of shaft of humerus, unspecified arm, initial encounter for closed fracture: Secondary | ICD-10-CM | POA: Diagnosis present

## 2015-10-12 DIAGNOSIS — S42301A Unspecified fracture of shaft of humerus, right arm, initial encounter for closed fracture: Principal | ICD-10-CM | POA: Insufficient documentation

## 2015-10-12 DIAGNOSIS — IMO0002 Reserved for concepts with insufficient information to code with codable children: Secondary | ICD-10-CM | POA: Diagnosis present

## 2015-10-12 DIAGNOSIS — I10 Essential (primary) hypertension: Secondary | ICD-10-CM | POA: Diagnosis present

## 2015-10-12 HISTORY — PX: ORIF HUMERUS FRACTURE: SHX2126

## 2015-10-12 LAB — GLUCOSE, CAPILLARY
GLUCOSE-CAPILLARY: 301 mg/dL — AB (ref 65–99)
GLUCOSE-CAPILLARY: 183 mg/dL — AB (ref 65–99)
Glucose-Capillary: 405 mg/dL — ABNORMAL HIGH (ref 65–99)
Glucose-Capillary: 367 mg/dL — ABNORMAL HIGH (ref 65–99)
Glucose-Capillary: 174 mg/dL — ABNORMAL HIGH (ref 65–99)
Glucose-Capillary: 221 mg/dL — ABNORMAL HIGH (ref 65–99)
Glucose-Capillary: 237 mg/dL — ABNORMAL HIGH (ref 65–99)
Glucose-Capillary: 298 mg/dL — ABNORMAL HIGH (ref 65–99)

## 2015-10-12 SURGERY — OPEN REDUCTION INTERNAL FIXATION (ORIF) PROXIMAL HUMERUS FRACTURE
Anesthesia: General | Site: Shoulder | Laterality: Right

## 2015-10-12 MED ORDER — INSULIN ASPART 100 UNIT/ML ~~LOC~~ SOLN
SUBCUTANEOUS | Status: AC
Start: 2015-10-12 — End: 2015-10-12
  Administered 2015-10-12: 10 [IU] via SUBCUTANEOUS
  Filled 2015-10-12: qty 1

## 2015-10-12 MED ORDER — LIDOCAINE HCL (CARDIAC) 20 MG/ML IV SOLN
INTRAVENOUS | Status: DC | PRN
  Administered 2015-10-12: 20 mg via INTRAVENOUS

## 2015-10-12 MED ORDER — GABAPENTIN 300 MG PO CAPS
300.0000 mg | ORAL_CAPSULE | Freq: Three times a day (TID) | ORAL | Status: DC
  Administered 2015-10-13 (×3): 300 mg via ORAL
  Filled 2015-10-12 (×4): qty 1

## 2015-10-12 MED ORDER — ALBUMIN HUMAN 5 % IV SOLN
INTRAVENOUS | Status: DC | PRN
  Administered 2015-10-12: 18:00:00 via INTRAVENOUS

## 2015-10-12 MED ORDER — SIMVASTATIN 10 MG PO TABS
10.0000 mg | ORAL_TABLET | Freq: Every day | ORAL | Status: DC
  Administered 2015-10-13: 10 mg via ORAL
  Filled 2015-10-12: qty 1

## 2015-10-12 MED ORDER — METOCLOPRAMIDE HCL 5 MG PO TABS: 5.0000 mg | ORAL_TABLET | Freq: Three times a day (TID) | ORAL | Status: DC | PRN

## 2015-10-12 MED ORDER — LOSARTAN POTASSIUM 50 MG PO TABS: 100.0000 mg | ORAL_TABLET | Freq: Every day | ORAL | Status: DC

## 2015-10-12 MED ORDER — MEPERIDINE HCL 25 MG/ML IJ SOLN: 6.2500 mg | INTRAMUSCULAR | Status: DC | PRN

## 2015-10-12 MED ORDER — PROPOFOL 10 MG/ML IV BOLUS
INTRAVENOUS | Status: AC
  Filled 2015-10-12: qty 20

## 2015-10-12 MED ORDER — MIDAZOLAM HCL 2 MG/2ML IJ SOLN
INTRAMUSCULAR | Status: AC
  Filled 2015-10-12: qty 2

## 2015-10-12 MED ORDER — PHENYLEPHRINE HCL 10 MG/ML IJ SOLN
INTRAMUSCULAR | Status: DC | PRN
  Administered 2015-10-12: 120 ug via INTRAVENOUS
  Administered 2015-10-12 (×4): 80 ug via INTRAVENOUS

## 2015-10-12 MED ORDER — ROCURONIUM BROMIDE 50 MG/5ML IV SOLN
INTRAVENOUS | Status: AC
  Filled 2015-10-12: qty 2

## 2015-10-12 MED ORDER — PROPOFOL 10 MG/ML IV BOLUS
INTRAVENOUS | Status: DC | PRN
  Administered 2015-10-12: 140 mg via INTRAVENOUS

## 2015-10-12 MED ORDER — CEFAZOLIN SODIUM-DEXTROSE 2-3 GM-% IV SOLR
INTRAVENOUS | Status: AC
  Filled 2015-10-12: qty 50

## 2015-10-12 MED ORDER — BUPIVACAINE-EPINEPHRINE (PF) 0.5% -1:200000 IJ SOLN
INTRAMUSCULAR | Status: DC | PRN
  Administered 2015-10-12: 30 mL via PERINEURAL

## 2015-10-12 MED ORDER — MORPHINE SULFATE (PF) 2 MG/ML IV SOLN
2.0000 mg | INTRAVENOUS | Status: DC | PRN
  Administered 2015-10-13 – 2015-10-14 (×2): 2 mg via INTRAVENOUS
  Filled 2015-10-12 (×2): qty 1

## 2015-10-12 MED ORDER — MIDAZOLAM HCL 2 MG/2ML IJ SOLN: 0.5000 mg | Freq: Once | INTRAMUSCULAR | Status: DC | PRN

## 2015-10-12 MED ORDER — BUPROPION HCL ER (XL) 150 MG PO TB24
300.0000 mg | ORAL_TABLET | Freq: Every day | ORAL | Status: DC
  Administered 2015-10-13: 300 mg via ORAL
  Filled 2015-10-12: qty 2

## 2015-10-12 MED ORDER — ARIPIPRAZOLE 5 MG PO TABS
5.0000 mg | ORAL_TABLET | Freq: Every day | ORAL | Status: DC
  Administered 2015-10-13: 5 mg via ORAL
  Filled 2015-10-12: qty 1

## 2015-10-12 MED ORDER — 0.9 % SODIUM CHLORIDE (POUR BTL) OPTIME
TOPICAL | Status: DC | PRN
  Administered 2015-10-12: 1000 mL
  Administered 2015-10-12: 2000 mL
  Administered 2015-10-12: 1000 mL
  Administered 2015-10-12: 3000 mL

## 2015-10-12 MED ORDER — ASPIRIN 325 MG PO TABS
325.0000 mg | ORAL_TABLET | Freq: Every day | ORAL | Status: DC
  Administered 2015-10-13: 325 mg via ORAL
  Filled 2015-10-12: qty 1

## 2015-10-12 MED ORDER — EPHEDRINE SULFATE 50 MG/ML IJ SOLN
INTRAMUSCULAR | Status: DC | PRN
  Administered 2015-10-12: 5 mg via INTRAVENOUS
  Administered 2015-10-12: 20 mg via INTRAVENOUS

## 2015-10-12 MED ORDER — GLYCOPYRROLATE 0.2 MG/ML IJ SOLN
INTRAMUSCULAR | Status: DC | PRN
  Administered 2015-10-12: 0.2 mg via INTRAVENOUS

## 2015-10-12 MED ORDER — PROMETHAZINE HCL 25 MG/ML IJ SOLN: 6.2500 mg | INTRAMUSCULAR | Status: DC | PRN

## 2015-10-12 MED ORDER — INSULIN ASPART 100 UNIT/ML FLEXPEN: 6.0000 [IU] | PEN_INJECTOR | Freq: Three times a day (TID) | SUBCUTANEOUS | Status: DC | PRN

## 2015-10-12 MED ORDER — METOPROLOL TARTRATE 12.5 MG HALF TABLET
12.5000 mg | ORAL_TABLET | Freq: Two times a day (BID) | ORAL | Status: DC
  Filled 2015-10-12: qty 1

## 2015-10-12 MED ORDER — HYDROMORPHONE HCL 1 MG/ML IJ SOLN
0.2500 mg | INTRAMUSCULAR | Status: DC | PRN
  Administered 2015-10-12 (×3): 0.5 mg via INTRAVENOUS

## 2015-10-12 MED ORDER — ACETAMINOPHEN 325 MG PO TABS: 650.0000 mg | ORAL_TABLET | Freq: Four times a day (QID) | ORAL | Status: DC | PRN

## 2015-10-12 MED ORDER — INSULIN ASPART 100 UNIT/ML ~~LOC~~ SOLN
SUBCUTANEOUS | Status: DC | PRN
  Administered 2015-10-12: 4 [IU] via INTRAVENOUS

## 2015-10-12 MED ORDER — ONDANSETRON HCL 4 MG PO TABS: 4.0000 mg | ORAL_TABLET | Freq: Four times a day (QID) | ORAL | Status: DC | PRN

## 2015-10-12 MED ORDER — ROCURONIUM BROMIDE 100 MG/10ML IV SOLN
INTRAVENOUS | Status: DC | PRN
  Administered 2015-10-12: 10 mg via INTRAVENOUS
  Administered 2015-10-12: 30 mg via INTRAVENOUS
  Administered 2015-10-12: 50 mg via INTRAVENOUS

## 2015-10-12 MED ORDER — ALPRAZOLAM 0.5 MG PO TABS
0.5000 mg | ORAL_TABLET | Freq: Two times a day (BID) | ORAL | Status: DC
  Administered 2015-10-13 (×3): 0.5 mg via ORAL
  Filled 2015-10-12 (×3): qty 1

## 2015-10-12 MED ORDER — NEOSTIGMINE METHYLSULFATE 10 MG/10ML IV SOLN
INTRAVENOUS | Status: DC | PRN
  Administered 2015-10-12: 1.5 mg via INTRAVENOUS

## 2015-10-12 MED ORDER — HYDROMORPHONE HCL 1 MG/ML IJ SOLN
INTRAMUSCULAR | Status: AC
  Administered 2015-10-12: 0.5 mg via INTRAVENOUS
  Filled 2015-10-12: qty 1

## 2015-10-12 MED ORDER — METOCLOPRAMIDE HCL 5 MG/ML IJ SOLN: 5.0000 mg | Freq: Three times a day (TID) | INTRAMUSCULAR | Status: DC | PRN

## 2015-10-12 MED ORDER — INSULIN GLARGINE 100 UNIT/ML ~~LOC~~ SOLN
60.0000 [IU] | Freq: Every day | SUBCUTANEOUS | Status: DC
  Administered 2015-10-13: 60 [IU] via SUBCUTANEOUS
  Filled 2015-10-12 (×2): qty 0.6

## 2015-10-12 MED ORDER — MELOXICAM 7.5 MG PO TABS
7.5000 mg | ORAL_TABLET | Freq: Two times a day (BID) | ORAL | Status: DC
  Administered 2015-10-13: 7.5 mg via ORAL
  Filled 2015-10-12 (×2): qty 1

## 2015-10-12 MED ORDER — CANAGLIFLOZIN 100 MG PO TABS
100.0000 mg | ORAL_TABLET | Freq: Every day | ORAL | Status: DC
  Filled 2015-10-12: qty 1

## 2015-10-12 MED ORDER — FENTANYL CITRATE (PF) 100 MCG/2ML IJ SOLN
INTRAMUSCULAR | Status: AC
  Filled 2015-10-12: qty 2

## 2015-10-12 MED ORDER — INSULIN ASPART 100 UNIT/ML ~~LOC~~ SOLN
10.0000 [IU] | Freq: Once | SUBCUTANEOUS | Status: AC
  Administered 2015-10-12: 10 [IU] via SUBCUTANEOUS

## 2015-10-12 MED ORDER — LEVOTHYROXINE SODIUM 100 MCG PO TABS
100.0000 ug | ORAL_TABLET | Freq: Every day | ORAL | Status: DC
  Administered 2015-10-13 – 2015-10-14 (×2): 100 ug via ORAL
  Filled 2015-10-12 (×3): qty 1

## 2015-10-12 MED ORDER — MIDAZOLAM HCL 5 MG/5ML IJ SOLN
INTRAMUSCULAR | Status: DC | PRN
  Administered 2015-10-12 (×2): 1 mg via INTRAVENOUS

## 2015-10-12 MED ORDER — BUPIVACAINE HCL (PF) 0.25 % IJ SOLN
INTRAMUSCULAR | Status: AC
  Filled 2015-10-12: qty 30

## 2015-10-12 MED ORDER — HYDROCODONE-ACETAMINOPHEN 10-325 MG PO TABS
1.0000 | ORAL_TABLET | ORAL | Status: DC | PRN
  Administered 2015-10-12 – 2015-10-13 (×4): 2 via ORAL
  Administered 2015-10-13: 1 via ORAL
  Administered 2015-10-13 – 2015-10-14 (×3): 2 via ORAL
  Filled 2015-10-12 (×6): qty 2
  Filled 2015-10-12: qty 1
  Filled 2015-10-12: qty 2

## 2015-10-12 MED ORDER — ONDANSETRON HCL 4 MG/2ML IJ SOLN
4.0000 mg | Freq: Four times a day (QID) | INTRAMUSCULAR | Status: DC | PRN
  Administered 2015-10-13: 4 mg via INTRAVENOUS
  Filled 2015-10-12 (×2): qty 2

## 2015-10-12 MED ORDER — ONDANSETRON HCL 4 MG/2ML IJ SOLN
INTRAMUSCULAR | Status: DC | PRN
  Administered 2015-10-12: 4 mg via INTRAVENOUS

## 2015-10-12 MED ORDER — CEFAZOLIN SODIUM-DEXTROSE 2-3 GM-% IV SOLR
2.0000 g | Freq: Four times a day (QID) | INTRAVENOUS | Status: AC
  Administered 2015-10-13 (×2): 2 g via INTRAVENOUS
  Filled 2015-10-12 (×2): qty 50

## 2015-10-12 MED ORDER — GLIMEPIRIDE 4 MG PO TABS
4.0000 mg | ORAL_TABLET | Freq: Every day | ORAL | Status: DC
  Administered 2015-10-13 – 2015-10-14 (×2): 4 mg via ORAL
  Filled 2015-10-12 (×3): qty 1

## 2015-10-12 MED ORDER — CEFAZOLIN SODIUM-DEXTROSE 2-3 GM-% IV SOLR
2.0000 g | INTRAVENOUS | Status: AC
  Administered 2015-10-12: 2 g via INTRAVENOUS

## 2015-10-12 MED ORDER — LORATADINE 10 MG PO TABS
10.0000 mg | ORAL_TABLET | Freq: Every day | ORAL | Status: DC
  Administered 2015-10-13: 10 mg via ORAL
  Filled 2015-10-12: qty 1

## 2015-10-12 MED ORDER — HYDROMORPHONE HCL 1 MG/ML IJ SOLN
INTRAMUSCULAR | Status: AC
  Filled 2015-10-12: qty 1

## 2015-10-12 MED ORDER — ACETAMINOPHEN 650 MG RE SUPP: 650.0000 mg | Freq: Four times a day (QID) | RECTAL | Status: DC | PRN

## 2015-10-12 MED ORDER — FENTANYL CITRATE (PF) 100 MCG/2ML IJ SOLN
INTRAMUSCULAR | Status: DC | PRN
  Administered 2015-10-12: 200 ug via INTRAVENOUS
  Administered 2015-10-12 (×2): 25 ug via INTRAVENOUS

## 2015-10-12 MED ORDER — METHOCARBAMOL 500 MG PO TABS
500.0000 mg | ORAL_TABLET | Freq: Two times a day (BID) | ORAL | Status: DC | PRN
  Administered 2015-10-13 – 2015-10-14 (×3): 500 mg via ORAL
  Filled 2015-10-12 (×3): qty 1

## 2015-10-12 MED ORDER — FENTANYL CITRATE (PF) 250 MCG/5ML IJ SOLN
INTRAMUSCULAR | Status: AC
  Filled 2015-10-12: qty 5

## 2015-10-12 MED ORDER — CHLORHEXIDINE GLUCONATE 4 % EX LIQD: 60.0000 mL | Freq: Once | CUTANEOUS | Status: DC

## 2015-10-12 MED ORDER — DIPHENHYDRAMINE HCL 50 MG/ML IJ SOLN
INTRAMUSCULAR | Status: DC | PRN
  Administered 2015-10-12: 12.5 mg via INTRAVENOUS

## 2015-10-12 MED ORDER — POTASSIUM CHLORIDE IN NACL 20-0.9 MEQ/L-% IV SOLN
INTRAVENOUS | Status: DC
  Administered 2015-10-12: via INTRAVENOUS
  Filled 2015-10-12 (×2): qty 1000

## 2015-10-12 MED ORDER — DEXTROSE 5 % IV SOLN
10.0000 mg | INTRAVENOUS | Status: DC | PRN
  Administered 2015-10-12: 20 ug/min via INTRAVENOUS

## 2015-10-12 MED ORDER — PANTOPRAZOLE SODIUM 40 MG PO TBEC
40.0000 mg | DELAYED_RELEASE_TABLET | Freq: Every day | ORAL | Status: DC
  Administered 2015-10-13: 40 mg via ORAL
  Filled 2015-10-12: qty 1

## 2015-10-12 MED ORDER — FUROSEMIDE 40 MG PO TABS: 40.0000 mg | ORAL_TABLET | Freq: Every day | ORAL | Status: DC

## 2015-10-12 MED ORDER — LACTATED RINGERS IV SOLN
INTRAVENOUS | Status: DC
  Administered 2015-10-12: 14:00:00 via INTRAVENOUS

## 2015-10-12 MED ORDER — POTASSIUM CHLORIDE CRYS ER 20 MEQ PO TBCR: 20.0000 meq | EXTENDED_RELEASE_TABLET | Freq: Every day | ORAL | Status: DC

## 2015-10-12 MED ORDER — LACTATED RINGERS IV SOLN
INTRAVENOUS | Status: DC | PRN
  Administered 2015-10-12 (×2): via INTRAVENOUS

## 2015-10-12 MED ORDER — ALBUTEROL SULFATE (2.5 MG/3ML) 0.083% IN NEBU
2.5000 mg | INHALATION_SOLUTION | Freq: Four times a day (QID) | RESPIRATORY_TRACT | Status: DC | PRN
  Administered 2015-10-13: 2.5 mg via RESPIRATORY_TRACT
  Filled 2015-10-12: qty 3

## 2015-10-12 MED ORDER — HYDROCHLOROTHIAZIDE 25 MG PO TABS: 25.0000 mg | ORAL_TABLET | Freq: Every day | ORAL | Status: DC

## 2015-10-12 SURGICAL SUPPLY — 91 items
BANDAGE ELASTIC 4 VELCRO ST LF (GAUZE/BANDAGES/DRESSINGS) IMPLANT
BANDAGE ELASTIC 6 VELCRO ST LF (GAUZE/BANDAGES/DRESSINGS) IMPLANT
BENZOIN TINCTURE PRP APPL 2/3 (GAUZE/BANDAGES/DRESSINGS) IMPLANT
BIT DRILL 2.0 (BIT) ×1
BIT DRILL 2.0MM (BIT) ×1
BIT DRILL 2XNS DISP SS SM FRAG (BIT) ×1 IMPLANT
BIT DRILL 3.2 (BIT) ×2
BIT DRILL 3.2XCALB NS DISP (BIT) ×1 IMPLANT
BIT DRILL CALIBRATED 2.7 (BIT) ×2 IMPLANT
BIT DRILL CALIBRATED 2.7MM (BIT) ×1
BIT DRL 2XNS DISP SS SM FRAG (BIT) ×1
BIT DRL 3.2XCALB NS DISP (BIT) ×1
BLADE 15 SAFETY STRL DISP (BLADE) ×3 IMPLANT
BNDG COHESIVE 4X5 TAN STRL (GAUZE/BANDAGES/DRESSINGS) ×3 IMPLANT
CLOSURE WOUND 1/2 X4 (GAUZE/BANDAGES/DRESSINGS) ×1
CORD BI POLAR (MISCELLANEOUS) ×3 IMPLANT
COVER SURGICAL LIGHT HANDLE (MISCELLANEOUS) ×3 IMPLANT
DRAIN PENROSE 1/2X12 LTX STRL (WOUND CARE) IMPLANT
DRAPE C-ARM 42X72 X-RAY (DRAPES) ×3 IMPLANT
DRAPE IMP U-DRAPE 54X76 (DRAPES) ×3 IMPLANT
DRAPE U-SHAPE 47X51 STRL (DRAPES) ×3 IMPLANT
DRSG AQUACEL AG ADV 3.5X14 (GAUZE/BANDAGES/DRESSINGS) ×3 IMPLANT
DRSG PAD ABDOMINAL 8X10 ST (GAUZE/BANDAGES/DRESSINGS) IMPLANT
DURAPREP 26ML APPLICATOR (WOUND CARE) ×6 IMPLANT
ELECT REM PT RETURN 9FT ADLT (ELECTROSURGICAL) ×3
ELECTRODE REM PT RTRN 9FT ADLT (ELECTROSURGICAL) ×1 IMPLANT
FACESHIELD WRAPAROUND (MASK) IMPLANT
GAUZE SPONGE 4X4 12PLY STRL (GAUZE/BANDAGES/DRESSINGS) IMPLANT
GAUZE XEROFORM 5X9 LF (GAUZE/BANDAGES/DRESSINGS) ×3 IMPLANT
GLOVE BIO SURGEON STRL SZ 6 (GLOVE) ×3 IMPLANT
GLOVE BIO SURGEON STRL SZ 6.5 (GLOVE) ×2 IMPLANT
GLOVE BIO SURGEONS STRL SZ 6.5 (GLOVE) ×1
GLOVE BIOGEL PI IND STRL 8 (GLOVE) ×1 IMPLANT
GLOVE BIOGEL PI INDICATOR 8 (GLOVE) ×2
GLOVE SURG ORTHO 8.0 STRL STRW (GLOVE) ×6 IMPLANT
GLOVE SURG SS PI 6.0 STRL IVOR (GLOVE) ×3 IMPLANT
GLOVE SURG SS PI 6.5 STRL IVOR (GLOVE) ×3 IMPLANT
GOWN STRL REUS W/ TWL LRG LVL3 (GOWN DISPOSABLE) ×3 IMPLANT
GOWN STRL REUS W/ TWL XL LVL3 (GOWN DISPOSABLE) ×1 IMPLANT
GOWN STRL REUS W/TWL LRG LVL3 (GOWN DISPOSABLE) ×6
GOWN STRL REUS W/TWL XL LVL3 (GOWN DISPOSABLE) ×2
K-WIRE 2X5 SS THRDED S3 (WIRE) ×6
KIT BASIN OR (CUSTOM PROCEDURE TRAY) ×3 IMPLANT
KIT ROOM TURNOVER OR (KITS) ×3 IMPLANT
KWIRE 2X5 SS THRDED S3 (WIRE) ×2 IMPLANT
MANIFOLD NEPTUNE II (INSTRUMENTS) ×3 IMPLANT
NEEDLE 21X1 OR PACK (NEEDLE) IMPLANT
NS IRRIG 1000ML POUR BTL (IV SOLUTION) ×3 IMPLANT
PACK SHOULDER (CUSTOM PROCEDURE TRAY) ×3 IMPLANT
PACK UNIVERSAL I (CUSTOM PROCEDURE TRAY) IMPLANT
PAD ARMBOARD 7.5X6 YLW CONV (MISCELLANEOUS) ×6 IMPLANT
PAD CAST 4YDX4 CTTN HI CHSV (CAST SUPPLIES) IMPLANT
PADDING CAST COTTON 4X4 STRL (CAST SUPPLIES)
PEG LOCKING 3.2MMX44 (Peg) ×3 IMPLANT
PEG LOCKING 3.2MMX46 (Peg) ×3 IMPLANT
PEG LOCKING 3.2X 22MM (Peg) ×6 IMPLANT
PEG LOCKING 3.2X 28MM (Peg) ×3 IMPLANT
PEG LOCKING 3.2X38 (Screw) ×6 IMPLANT
PEG LOCKING 3.2X52 (Peg) ×3 IMPLANT
PENCIL BUTTON HOLSTER BLD 10FT (ELECTRODE) ×3 IMPLANT
PLATE PROX HUMERUS LOW RT 14H (Plate) ×3 IMPLANT
PUTTY DBM STAGRAFT PLUS 10CC (Putty) ×3 IMPLANT
SCREW CORTICAL 2.7MM  24MM (Screw) ×6 IMPLANT
SCREW CORTICAL 2.7MM  26MM (Screw) ×2 IMPLANT
SCREW CORTICAL 2.7MM  30MM (Screw) ×2 IMPLANT
SCREW CORTICAL 2.7MM 24MM (Screw) ×3 IMPLANT
SCREW CORTICAL 2.7MM 26MM (Screw) ×1 IMPLANT
SCREW CORTICAL 2.7MM 30MM (Screw) ×1 IMPLANT
SCREW LP NL T15 3.5X20 (Screw) ×6 IMPLANT
SCREW LP NL T15 3.5X22 (Screw) ×6 IMPLANT
SCREW LP NL T15 3.5X24 (Screw) ×3 IMPLANT
SCREW T15 LP CORT 3.5X38MM NS (Screw) ×3 IMPLANT
SLEEVE MEASURING 3.2 (BIT) ×3 IMPLANT
SLING ARM FOAM STRAP LRG (SOFTGOODS) ×3 IMPLANT
SPONGE LAP 4X18 X RAY DECT (DISPOSABLE) ×6 IMPLANT
STAPLER VISISTAT 35W (STAPLE) IMPLANT
STOCKINETTE IMPERVIOUS 9X36 MD (GAUZE/BANDAGES/DRESSINGS) ×3 IMPLANT
STRIP CLOSURE SKIN 1/2X4 (GAUZE/BANDAGES/DRESSINGS) ×2 IMPLANT
SUCTION FRAZIER TIP 10 FR DISP (SUCTIONS) ×3 IMPLANT
SUT MNCRL AB 3-0 PS2 18 (SUTURE) ×3 IMPLANT
SUT VIC AB 0 CT1 27 (SUTURE) ×6
SUT VIC AB 0 CT1 27XBRD ANBCTR (SUTURE) ×3 IMPLANT
SUT VIC AB 1 CT1 27 (SUTURE) ×10
SUT VIC AB 1 CT1 27XBRD ANBCTR (SUTURE) ×5 IMPLANT
SUT VIC AB 2-0 CTB1 (SUTURE) ×12 IMPLANT
TOWEL OR 17X24 6PK STRL BLUE (TOWEL DISPOSABLE) ×3 IMPLANT
TOWEL OR 17X26 10 PK STRL BLUE (TOWEL DISPOSABLE) ×3 IMPLANT
TUBE CONNECTING 12'X1/4 (SUCTIONS)
TUBE CONNECTING 12X1/4 (SUCTIONS) IMPLANT
WATER STERILE IRR 1000ML POUR (IV SOLUTION) ×3 IMPLANT
YANKAUER SUCT BULB TIP NO VENT (SUCTIONS) IMPLANT
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
## 2015-10-12 NOTE — Op Note (Signed)
NAME:  [name], CHIPLEY NO.:  [id number]  MEDICAL RECORD NO.:  [id number]  LOCATION:  [facility]                         FACILITY:  [facility]  PHYSICIAN:  Burnard Bunting, M.D.    DATE OF BIRTH:  05/25/1961  DATE OF PROCEDURE: DATE OF DISCHARGE:                              OPERATIVE REPORT   PREOPERATIVE DIAGNOSIS:  Right proximal humerus fracture and humeral shaft nonunion.  POSTOPERATIVE DIAGNOSIS:  Right proximal humerus fracture and humeral shaft nonunion.  PROCEDURE:  Right proximal humerus fracture and humeral shaft nonunion repair, using Biomet long 14 hole plate with bone graft.  SURGEONS:  Burnard Bunting, M.D.  ASSISTANT:  April Neva Seat.  ANESTHESIA:  General.  ESTIMATED BLOOD LOSS:  250.  DRAINS:  None.  INDICATIONS:  [name] is a 55 year old patient, with right proximal humerus shaft fracture nonunion.  She presents for operative management after explanation of risks and benefits.  PROCEDURE IN DETAIL:  The patient was brought to the operating room, where general anesthetic was induced.  Preop antibiotics were administered.  Time-out was called.  The patient was placed supine on the fracture bed.  The patient's right arm was prescrubbed with alcohol and Betadine, allowed to air dry, prepped with DuraPrep solution draped in sterile manner.  Collier Flowers was used to cover the operative field. Incision was made from just lateral to the coracoid process to the elbow flexion crease.  Skin and subcutaneous tissue were sharply divided.  The patient had a very large soft tissue envelope.  Deltopectoral interval was identified.  The patient had actually scarred in from the nonunion. This made it difficult.  Cephalic vein was miniscule.  Deltopectoral interval was developed.  Biceps tendon was mobilized medially. Brachialis was split, however, there was fracture nonunion presents with multiple fragments with 4 fragments involved.  Soft tissue stripping had to be  performed on these fragments in order to mobilize and we get them to come back together.  Care was taken to avoid injury to distally to the lateral and brachial cutaneous nerve as well as to the radial nerve. At this time, the fluoroscopy was utilized to achieve spatial orientation of the fracture fragments.  The intramedullary canal was reestablished in the distal fragment.  The deltoid had to be detached to a significant degree in order to mobilize the middle fragment, which actually spin the head to the shaft.  A trial clamps were used to achieve reduction, 2 lag screws were placed between the butterfly fragment medially and the distal fragment and then 2 more lag screws placed from the butterfly fragment to the proximal fragment after reduction.  Plate was applied and secured with both proximal and distal screws.  Five screws distally.  The plate spin from the olecranon fossa from just proximal olecranon fossa to the humeral head.  Thorough irrigation was then performed after fluoroscopy was used to check correct placement of the screws.  The lag screws were placed in such a manner to try to avoid irritation of the radial nerve.  In this plane, however, was really the only way that lag screws could be applied, which was necessary for reduction of this complex fracture nonunion.  Thorough irrigation performed.  The free-floating last piece was then placed in lag fashion.  Bone graft was then applied.  The incision was then closed by reapproximating deltoid fascia by reapproximating the deltoid fascia to the anterior fascia.  Reapproximating deltoid fascia to the anterior neck nonunion tissue.  It should be noted that all bony ends were scraped in order to remove this nonunion tissue in order to facilitate reduction.  Following this, the skin was closed using interrupted inverted #0 Vicryl suture, 2-0 Vicryl suture, and a Monocryl.  The patient was placed in the Aquacel dressing and a  sling.  The patient tolerated the procedure well without immediate complication. Transferred to recovery room in stable condition.     Burnard Bunting, M.D.     GSD/MEDQ  D:  10/12/2015  T:  10/12/2015  Job:  [id number]

## 2015-10-12 NOTE — Brief Op Note (Signed)
10/12/2015  8:29 PM  PATIENT:  [name]  55 y.o. female  PRE-OPERATIVE DIAGNOSIS:  RIGHT PROXIMAL HUMERUS and humeral shaft FRACTURE NONUNION  POST-OPERATIVE DIAGNOSIS:  RIGHT PROXIMAL HUMERUS and humeral shaft FRACTURE NONUNION  PROCEDURE:  Procedure(s): PROXIMAL HUMERUS and humeral shaft  FRACTURE NONUNION REPAIR.   SURGEON:  Surgeon(s): Cammy Copa, MD  ASSISTANT: April Green RNFA  ANESTHESIA:   general  EBL: 250 ml       BLOOD ADMINISTERED: none  DRAINS: none   LOCAL MEDICATIONS USED:  none  SPECIMEN:  No Specimen  COUNTS:  YES  TOURNIQUET:  * No tourniquets in log *  DICTATION: .Other Dictation: Dictation Number [id number]  PLAN OF CARE: Admit for overnight observation  PATIENT DISPOSITION:  PACU - hemodynamically stable

## 2015-10-12 NOTE — Anesthesia Procedure Notes (Addendum)
Anesthesia Regional Block:  Interscalene brachial plexus block  Pre-Anesthetic Checklist: ,, timeout performed, Correct Patient, Correct Site, Correct Laterality, Correct Procedure, Correct Position, site marked, Risks and benefits discussed,  Surgical consent,  Pre-op evaluation,  At surgeon's request and post-op pain management  Laterality: Right and Upper  Prep: chloraprep       Needles:  Injection technique: Single-shot  Needle Type: Echogenic Stimulator Needle     Needle Length: 5cm 5 cm Needle Gauge: 22 and 22 G    Additional Needles:  Procedures: nerve stimulator Interscalene brachial plexus block  Nerve Stimulator or Paresthesia:  Response: forearm twitch, 0.45 mA, 0.1 ms,   Additional Responses:   Narrative:  Start time: 10/12/2015 3:11 PM End time: 10/12/2015 3:17 PM Injection made incrementally with aspirations every 5 mL.  Performed by: Personally  Anesthesiologist: Jean Rosenthal, CARSWELL  Additional Notes: Pt identified in Holding room.  Monitors applied. Working IV access confirmed. Sterile prep R neck.  #22ga PNS to forearm twitch at 0.83mA threshold.  30cc 0.5% Bupivacaine with 1:200k epi injected incrementally after negative test dose.  Patient asymptomatic, VSS, no heme aspirated, tolerated well.  Sandford Craze, MD   Procedure Name: Intubation Date/Time: 10/12/2015 3:33 PM Performed by: Fabian November Pre-anesthesia Checklist: Patient identified, Patient being monitored, Timeout performed, Emergency Drugs available and Suction available Patient Re-evaluated:Patient Re-evaluated prior to inductionOxygen Delivery Method: Circle System Utilized Preoxygenation: Pre-oxygenation with 100% oxygen Intubation Type: IV induction Ventilation: Mask ventilation without difficulty and Oral airway inserted - appropriate to patient size Laryngoscope Size: Miller and 3 Grade View: Grade I Tube type: Oral Tube size: 7.5 mm Number of attempts: 1 Airway Equipment and  Method: Stylet Placement Confirmation: ETT inserted through vocal cords under direct vision,  positive ETCO2 and breath sounds checked- equal and bilateral Secured at: 21 cm Tube secured with: Tape Dental Injury: Teeth and Oropharynx as per pre-operative assessment

## 2015-10-12 NOTE — Progress Notes (Signed)
Patient blood glucose decreased to 367, notified Dr. Sandford Craze, will continue to monitor.

## 2015-10-12 NOTE — Progress Notes (Signed)
Patient blood glucose 405 on admission, notified Dr. C. Jackson, new orders received. Will continue to monitor closely. 

## 2015-10-12 NOTE — Progress Notes (Deleted)
Patient blood glucose 405 on admission, notified Dr. Sandford Craze, new orders received. Will continue to monitor closely.

## 2015-10-12 NOTE — Transfer of Care (Signed)
Immediate Anesthesia Transfer of Care Note  Patient: [name]  Procedure(s) Performed: Procedure(s): PROXIMAL HUMERUS FRACTURE NONUNION REPAIR.  (Right)  Patient Location: PACU  Anesthesia Type:General and GA combined with regional for post-op pain  Level of Consciousness: awake, oriented and patient cooperative  Airway & Oxygen Therapy: Patient Spontanous Breathing and Patient connected to nasal cannula oxygen  Post-op Assessment: Report given to RN, Post -op Vital signs reviewed and stable and Patient moving all extremities X 4  Post vital signs: Reviewed and stable  Last Vitals:  Filed Vitals:   10/12/15 1332 10/12/15 1509  BP: 134/68 116/60  Pulse: 97 80  Temp: 36.7 C   Resp:  15    Complications: No apparent anesthesia complications

## 2015-10-12 NOTE — Anesthesia Preprocedure Evaluation (Addendum)
Anesthesia Evaluation  Patient identified by MRN, date of birth, ID band  Reviewed: Allergy & Precautions, NPO status , Patient's Chart, lab work & pertinent test results, reviewed documented beta blocker date and time   History of Anesthesia Complications (+) PONV and history of anesthetic complications  Airway Mallampati: II  TM Distance: >3 FB Neck ROM: Full    Dental  (+) Dental Advisory Given   Pulmonary pneumonia, resolved, COPD,  COPD inhaler and oxygen dependent, former smoker (quit 2013),  Home O2 at 2L PRN   breath sounds clear to auscultation       Cardiovascular Exercise Tolerance: Poor hypertension, Pt. on medications and Pt. on home beta blockers (-) angina+ Past MI (??) and +CHF  + Valvular Problems/Murmurs (mitral stenosis)  Rhythm:Regular Rate:Normal  '16 ECHO: EF 65-70%, mitral stenosis   Neuro/Psych Seizures -, Well Controlled,  PSYCHIATRIC DISORDERS Anxiety Depression Bipolar Disorder Seizures as a child  Neuromuscular disease    GI/Hepatic GERD  Medicated and Controlled,(+)     substance abuse  cocaine use,   Endo/Other  diabetes (glu 367), Poorly Controlled, Type 2, Insulin Dependent, Oral Hypoglycemic AgentsHypothyroidism Morbid obesity  Renal/GU Renal diseaseCreat 1.09 from 10/04/15     Musculoskeletal  (+) Arthritis , Osteoarthritis,    Abdominal (+) + obese,   Peds  Hematology negative hematology ROS (+)   Anesthesia Other Findings   Reproductive/Obstetrics                          BP Readings from Last 3 Encounters:  10/12/15 134/68  10/04/15 93/44  09/07/15 130/60   Lab Results  Component Value Date   WBC 7.3 10/04/2015   HGB 14.0 10/04/2015   HCT 43.2 10/04/2015   MCV 82.4 10/04/2015   PLT 335 10/04/2015     Chemistry      Component Value Date/Time   NA 139 10/04/2015 1034   K 3.6 10/04/2015 1034   CL 98* 10/04/2015 1034   CO2 28 10/04/2015 1034    BUN 22* 10/04/2015 1034   CREATININE 1.09* 10/04/2015 1034      Component Value Date/Time   CALCIUM 9.7 10/04/2015 1034   ALKPHOS 122 10/04/2015 1034   AST 18 10/04/2015 1034   ALT 11* 10/04/2015 1034   BILITOT 0.8 10/04/2015 1034     EKG 08/07/15: SB  ECHO 08/22/15: Study Conclusions - Left ventricle: The cavity size was normal. Systolic function was normal. The estimated ejection fraction was in the range of 60% to 65%. Wall motion was normal; there were no regional wall motion abnormalities. Mitral stenosis prevents evaluation of LV diastolic function. - Mitral valve: The findings are consistent with moderate to severe stenosis.    Anesthesia Physical Anesthesia Plan  ASA: III  Anesthesia Plan: General   Post-op Pain Management: GA combined w/ Regional for post-op pain   Induction: Intravenous  Airway Management Planned: Oral ETT  Additional Equipment:   Intra-op Plan:   Post-operative Plan: Extubation in OR  Informed Consent: I have reviewed the patients History and Physical, chart, labs and discussed the procedure including the risks, benefits and alternatives for the proposed anesthesia with the patient or authorized representative who has indicated his/her understanding and acceptance.   Dental advisory given  Plan Discussed with: CRNA and Surgeon  Anesthesia Plan Comments: (Plan routine monitors, GETA with interscalene block for post op analgesia)        Anesthesia Quick Evaluation

## 2015-10-13 ENCOUNTER — Observation Stay (HOSPITAL_COMMUNITY): Payer: Medicaid Other

## 2015-10-13 ENCOUNTER — Encounter (HOSPITAL_COMMUNITY): Payer: Self-pay | Admitting: Orthopedic Surgery

## 2015-10-13 DIAGNOSIS — S42301A Unspecified fracture of shaft of humerus, right arm, initial encounter for closed fracture: Secondary | ICD-10-CM | POA: Diagnosis not present

## 2015-10-13 DIAGNOSIS — N182 Chronic kidney disease, stage 2 (mild): Secondary | ICD-10-CM | POA: Diagnosis not present

## 2015-10-13 DIAGNOSIS — IMO0002 Reserved for concepts with insufficient information to code with codable children: Secondary | ICD-10-CM | POA: Diagnosis present

## 2015-10-13 DIAGNOSIS — E1122 Type 2 diabetes mellitus with diabetic chronic kidney disease: Secondary | ICD-10-CM | POA: Diagnosis not present

## 2015-10-13 DIAGNOSIS — E1165 Type 2 diabetes mellitus with hyperglycemia: Secondary | ICD-10-CM

## 2015-10-13 DIAGNOSIS — I5032 Chronic diastolic (congestive) heart failure: Secondary | ICD-10-CM | POA: Diagnosis present

## 2015-10-13 DIAGNOSIS — E1129 Type 2 diabetes mellitus with other diabetic kidney complication: Secondary | ICD-10-CM | POA: Diagnosis present

## 2015-10-13 DIAGNOSIS — Z794 Long term (current) use of insulin: Secondary | ICD-10-CM | POA: Diagnosis not present

## 2015-10-13 LAB — BASIC METABOLIC PANEL
Anion gap: 14 (ref 5–15)
BUN: 26 mg/dL — AB (ref 6–20)
CALCIUM: 8.4 mg/dL — AB (ref 8.9–10.3)
CO2: 26 mmol/L (ref 22–32)
Chloride: 94 mmol/L — ABNORMAL LOW (ref 101–111)
Creatinine, Ser: 1.41 mg/dL — ABNORMAL HIGH (ref 0.44–1.00)
GFR calc Af Amer: 47 mL/min — ABNORMAL LOW (ref >60)
GFR, EST NON AFRICAN AMERICAN: 41 mL/min — AB (ref >60)
GLUCOSE: 460 mg/dL — AB (ref 65–99)
Potassium: 5.2 mmol/L — ABNORMAL HIGH (ref 3.5–5.1)
Sodium: 134 mmol/L — ABNORMAL LOW (ref 135–145)

## 2015-10-13 LAB — GLUCOSE, CAPILLARY
GLUCOSE-CAPILLARY: 151 mg/dL — AB (ref 65–99)
GLUCOSE-CAPILLARY: 109 mg/dL — AB (ref 65–99)
GLUCOSE-CAPILLARY: 63 mg/dL — AB (ref 65–99)
GLUCOSE-CAPILLARY: 124 mg/dL — AB (ref 65–99)
Glucose-Capillary: 441 mg/dL — ABNORMAL HIGH (ref 65–99)
Glucose-Capillary: 455 mg/dL — ABNORMAL HIGH (ref 65–99)
Glucose-Capillary: 400 mg/dL — ABNORMAL HIGH (ref 65–99)

## 2015-10-13 LAB — CBC WITH DIFFERENTIAL/PLATELET
BASOS PCT: 0 %
Basophils Absolute: 0 10*3/uL (ref 0.0–0.1)
Eosinophils Absolute: 0 10*3/uL (ref 0.0–0.7)
Eosinophils Relative: 0 %
HCT: 31.9 % — ABNORMAL LOW (ref 36.0–46.0)
Hemoglobin: 10.3 g/dL — ABNORMAL LOW (ref 12.0–15.0)
Lymphocytes Relative: 7 %
Lymphs Abs: 0.9 10*3/uL (ref 0.7–4.0)
MCH: 27.3 pg (ref 26.0–34.0)
MCHC: 32.3 g/dL (ref 30.0–36.0)
MCV: 84.6 fL (ref 78.0–100.0)
MONO ABS: 0.7 10*3/uL (ref 0.1–1.0)
Monocytes Relative: 5 %
NEUTROS ABS: 12.1 10*3/uL — AB (ref 1.7–7.7)
Neutrophils Relative %: 88 %
PLATELETS: 294 10*3/uL (ref 150–400)
RBC: 3.77 MIL/uL — ABNORMAL LOW (ref 3.87–5.11)
RDW: 17.1 % — AB (ref 11.5–15.5)
WBC: 13.7 10*3/uL — AB (ref 4.0–10.5)

## 2015-10-13 LAB — GLUCOSE, RANDOM: Glucose, Bld: 481 mg/dL — ABNORMAL HIGH (ref 65–99)

## 2015-10-13 MED ORDER — SODIUM CHLORIDE 0.9 % IV SOLN
INTRAVENOUS | Status: AC
  Administered 2015-10-14: 01:00:00 via INTRAVENOUS

## 2015-10-13 MED ORDER — METOPROLOL TARTRATE 12.5 MG HALF TABLET
12.5000 mg | ORAL_TABLET | Freq: Two times a day (BID) | ORAL | Status: DC
  Filled 2015-10-13 (×2): qty 1

## 2015-10-13 MED ORDER — HYDROCODONE-ACETAMINOPHEN 10-325 MG PO TABS: 1.0000 | ORAL_TABLET | ORAL | Status: DC | PRN

## 2015-10-13 MED ORDER — INSULIN ASPART 100 UNIT/ML ~~LOC~~ SOLN
0.0000 [IU] | SUBCUTANEOUS | Status: DC
  Administered 2015-10-13: 20 [IU] via SUBCUTANEOUS
  Administered 2015-10-13: 3 [IU] via SUBCUTANEOUS
  Administered 2015-10-14: 4 [IU] via SUBCUTANEOUS
  Administered 2015-10-14: 7 [IU] via SUBCUTANEOUS

## 2015-10-13 MED ORDER — INSULIN ASPART 100 UNIT/ML ~~LOC~~ SOLN
25.0000 [IU] | Freq: Once | SUBCUTANEOUS | Status: AC
  Administered 2015-10-13: 25 [IU] via SUBCUTANEOUS

## 2015-10-13 MED ORDER — INSULIN GLARGINE 100 UNIT/ML ~~LOC~~ SOLN
60.0000 [IU] | Freq: Every day | SUBCUTANEOUS | Status: DC
  Administered 2015-10-13: 60 [IU] via SUBCUTANEOUS
  Filled 2015-10-13 (×2): qty 0.6

## 2015-10-13 NOTE — Progress Notes (Signed)
OT Cancellation Note  Patient Details Name: [name] MRN: [id number] DOB: 12-08-1960   Cancelled Treatment:    Reason Eval/Treat Not Completed: Patient declined, no reason specified. Pt reports she just got R arm comfortable and does not want to get OOB at this time. Pt reports she is planning to stay at least one more night in the hospital. Will follow up for OT treatment as time allows.  Gaye Alken M.S., OTR/L Pager: [phone]   10/13/2015, 11:46 AM

## 2015-10-13 NOTE — Anesthesia Postprocedure Evaluation (Signed)
Anesthesia Post Note  Patient: [name]  Procedure(s) Performed: Procedure(s) (LRB): PROXIMAL HUMERUS FRACTURE NONUNION REPAIR.  (Right)  Patient location during evaluation: PACU Anesthesia Type: General and Regional Level of consciousness: awake and awake and alert Pain management: pain level controlled Vital Signs Assessment: post-procedure vital signs reviewed and stable Respiratory status: spontaneous breathing and nonlabored ventilation Anesthetic complications: no    Last Vitals:  Filed Vitals:   10/12/15 2142 10/12/15 2202  BP: 101/52 100/48  Pulse: 96 92  Temp: 36.5 C 37.2 C  Resp: 14 16    Last Pain:  Filed Vitals:   10/12/15 2352  PainSc: 8                  Cherilynn Schomburg COKER

## 2015-10-13 NOTE — Progress Notes (Signed)
Subjective: Pt stable - pain ok - cbg high   Objective: Vital signs in last 24 hours: Temp:  [97.7 F (36.5 C)-99 F (37.2 C)] 97.9 F (36.6 C) (01/20 0527) Pulse Rate:  [80-97] 95 (01/20 0527) Resp:  [13-16] 16 (01/20 0527) BP: (92-134)/(46-68) 92/46 mmHg (01/20 0527) SpO2:  [92 %-98 %] 97 % (01/20 0527) Weight:  [90.266 kg (199 lb)] 90.266 kg (199 lb) (01/19 1332)  Intake/Output from previous day: 01/19 0701 - 01/20 0700 In: 2188.8 [I.V.:1888.8; IV Piggyback:300] Out: 650 [Urine:400; Blood:250] Intake/Output this shift:    Exam:  hand perfused right - epl fpl io functional  Labs:  Recent Labs  10/13/15 0403  HGB 10.3*    Recent Labs  10/13/15 0403  WBC 13.7*  RBC 3.77*  HCT 31.9*  PLT 294    Recent Labs  10/13/15 0403  NA 134*  K 5.2*  CL 94*  CO2 26  BUN 26*  CREATININE 1.41*  GLUCOSE 460*  CALCIUM 8.4*   No results for input(s): LABPT, INR in the last 72 hours.  Assessment/Plan: IM consult for cbg management - thx for consult - plan dc this afternoon if cbg can be decreased   DEAN,GREGORY SCOTT 10/13/2015, 9:07 AM

## 2015-10-13 NOTE — Progress Notes (Signed)
CRITICAL VALUE ALERT  Critical value received:  441  Date of notification:  10/13/2015  Time of notification:  0700  Critical value read back:yes  Nurse who received alert:  Anselm Lis  MD notified (1st page):  Yes  Time of first page:  0730  MD notified (2nd page):  Time of second page:  Responding MD:  Dr dean  Time MD responded:  [phone]

## 2015-10-13 NOTE — Progress Notes (Signed)
Hypoglycemic Event  CBG: 63  Treatment: 15 GM carbohydrate snack  Symptoms: None  Follow-up CBG: Time:1709 CBG Result:109  Possible Reasons for Event: Inadequate meal intake  Comments/MD notified: Junious Silk, NP    Sedonia Small

## 2015-10-13 NOTE — Evaluation (Signed)
Occupational Therapy Evaluation Patient Details Name: [name] MRN: [id number] DOB: December 21, 1960 Today's Date: 10/13/2015    History of Present Illness Pt is a 55 y.o. female s/p PROXIMAL HUMERUS and humeral shaft FRACTURE NONUNION REPAIR. PMH: HTN, Neuropathy, DM 2, COPD, CHF, Bipolar disorder, Anxiety, Depression.    Clinical Impression   Pt reports that she required assist with bathing and dressing PTA. Currently pt is overall max assist with ADLs and mod assist with stand pivot transfer from EOB to [facility]. Pt very impulsive and unsafe with transfers; gets ahead of herself and does not listen to VCs provided by therapist. Pt planning to d/c home with 24/7 supervision from her husband and daughter. Pt currently has a home health aide that comes to the house 3 days per week for 2-3 hours to assist with ADLs/IADLs. Discussed potential for post acute rehab with pt and husband; they made it clear they are not agreeable to further rehab. Recommending HHOT for follow up in order to maximize independence with ADLs and functional mobility for safe d/c home. Pt would benefit from continued skilled OT to increased independence and safety with UB ADLs, toilet transfers, and RUE HEP.    Follow Up Recommendations  Home health OT;Supervision/Assistance - 24 hour    Equipment Recommendations  3 in 1 bedside comode    Recommendations for Other Services PT consult     Precautions / Restrictions Precautions Precautions: Shoulder;Fall Type of Shoulder Precautions: Conservative protocol: NO shoulder AROM/PROM. AROM elbow, wrist, hand OK.  Shoulder Interventions: Shoulder sling/immobilizer;At all times Precaution Booklet Issued: Yes (comment) Precaution Comments: Reviewed all precautions with pt and husband. Required Braces or Orthoses: Sling Restrictions Weight Bearing Restrictions: Yes RUE Weight Bearing: Weight bearing as tolerated (Orders for WBAT, maintained NWB throughout session)       Mobility Bed Mobility Overal bed mobility: Needs Assistance Bed Mobility: Supine to Sit;Sit to Supine     Supine to sit: Mod assist Sit to supine: Min assist   General bed mobility comments: Mod assist required for trunk and legs with supine to sit. Min assist to reposition in bed. Max verbal cues throughout for technique.   Transfers Overall transfer level: Needs assistance Equipment used: Hemi-walker Transfers: Sit to/from [employer] Sit to Stand: Mod assist Stand pivot transfers: Mod assist       General transfer comment: Pt very unsafe with transfers, impulsive and not following instructions. Mod assist to boost up and for balance in standing with stand pivot transfer.    Balance Overall balance assessment: Needs assistance Sitting-balance support: Feet supported Sitting balance-Leahy Scale: Fair     Standing balance support: Single extremity supported Standing balance-Leahy Scale: Poor                              ADL Overall ADL's : Needs assistance/impaired Eating/Feeding: Set up;Sitting   Grooming: Set up;Sitting   Upper Body Bathing: Maximal assistance;Sitting Upper Body Bathing Details (indicate cue type and reason): Educated on UB bathing technique. Lower Body Bathing: Maximal assistance;Sit to/from stand   Upper Body Dressing : Maximal assistance;Sitting Upper Body Dressing Details (indicate cue type and reason): Educated on UB dressing technique. Lower Body Dressing: Maximal assistance;Sit to/from stand Lower Body Dressing Details (indicate cue type and reason): Pt unable to don socks; max assist provided. Toilet Transfer: Moderate assistance;Stand-pivot;BSC (hemi walker) Toilet Transfer Details (indicate cue type and reason): Very impulsive with transfer; demonstrating unsafe technique, not listening to  therapists VCs.  Toileting- Clothing Manipulation and Hygiene: Total assistance;Sit to/from stand Toileting - Clothing  Manipulation Details (indicate cue type and reason): for toilet hygiene     Functional mobility during ADLs: Moderate assistance (Hemi walker. Stand pivot transfer only.) General ADL Comments: Pts husband present for OT eval. Educated on home safety, ice for edema and pain, RUE positioning, precautions; pt verbalized understanding. Pt and husband with interesting dynamic; pt attempting unsafe transfers, no listening to therapists verbal cues.      Vision     Perception     Praxis      Pertinent Vitals/Pain Pain Assessment: Faces Faces Pain Scale: Hurts even more Pain Location: R shoulder Pain Descriptors / Indicators: Aching;Grimacing;Operative site guarding;Sore Pain Intervention(s): Limited activity within patient's tolerance;Monitored during session;Repositioned     Hand Dominance Left   Extremity/Trunk Assessment Upper Extremity Assessment Upper Extremity Assessment: RUE deficits/detail RUE Deficits / Details: Decreased finger AROM (flex and ext); pt reports this was present prior to surgery. RUE: Unable to fully assess due to pain;Unable to fully assess due to immobilization RUE Sensation: history of peripheral neuropathy   Lower Extremity Assessment Lower Extremity Assessment: Generalized weakness   Cervical / Trunk Assessment Cervical / Trunk Assessment: Kyphotic   Communication Communication Communication: No difficulties   Cognition Arousal/Alertness: Awake/alert Behavior During Therapy: Impulsive Overall Cognitive Status: Within Functional Limits for tasks assessed                     General Comments       Exercises Exercises: Shoulder     Shoulder Instructions Shoulder Instructions Donning/doffing shirt without moving shoulder: Maximal assistance (educated) Method for sponge bathing under operated UE: Maximal assistance (educated) Donning/doffing sling/immobilizer: Maximal assistance (educated) Correct positioning of sling/immobilizer:  Maximal assistance (educated) Sling wearing schedule (on at all times/off for ADL's): Supervision/safety Proper positioning of operated UE when showering: Maximal assistance (educated) Positioning of UE while sleeping: Maximal assistance (educated)    Home Living Family/patient expects to be discharged to:: Private residence Living Arrangements: Spouse/significant other Available Help at Discharge: Family;Available 24 hours/day Type of Home: Apartment Home Access: Level entry     Home Layout: One level     Bathroom Shower/Tub: Chief Strategy Officer: Standard Bathroom Accessibility: Yes How Accessible: Accessible via wheelchair Home Equipment: Walker - 2 wheels;Cane - single point;Shower seat;Grab bars - tub/shower;Hand held shower head;Wheelchair - manual;Other (comment) (hemi walker)          Prior Functioning/Environment Level of Independence: Needs assistance  Gait / Transfers Assistance Needed: Using hemi walker for functional mobility in home and in community. ADL's / Homemaking Assistance Needed: Has a home health aide that assists with bathing and dressing. 3x per week for 2-3 hours.         OT Diagnosis: Generalized weakness;Acute pain   OT Problem List: Decreased strength;Decreased range of motion;Decreased activity tolerance;Impaired balance (sitting and/or standing);Decreased safety awareness;Decreased knowledge of use of DME or AE;Decreased knowledge of precautions;Impaired UE functional use;Pain;Increased edema   OT Treatment/Interventions: Self-care/ADL training;Therapeutic exercise;Energy conservation;DME and/or AE instruction;Therapeutic activities;Patient/family education;Balance training    OT Goals(Current goals can be found in the care plan section) Acute Rehab OT Goals Patient Stated Goal: home today OT Goal Formulation: With patient/family Time For Goal Achievement: 10/27/15 Potential to Achieve Goals: Fair ADL Goals Pt Will Perform  Grooming: with caregiver independent in assisting;with min assist;standing Pt Will Perform Upper Body Bathing: with min assist;with caregiver independent in assisting;sitting Pt Will Perform Upper  Body Dressing: with min assist;with caregiver independent in assisting;sitting Pt Will Transfer to Toilet: with min guard assist;ambulating;bedside commode (over toilet) Pt Will Perform Toileting - Clothing Manipulation and hygiene: with min guard assist;sitting/lateral leans Pt/caregiver will Perform Home Exercise Program: Increased ROM;Right Upper extremity;Independently;With written HEP provided  OT Frequency: Min 3X/week   Barriers to D/C:            Co-evaluation              End of Session Equipment Utilized During Treatment: Gait belt;Oxygen;Other (comment) (hemi walker, sling) Nurse Communication: Mobility status  Activity Tolerance: Patient limited by pain Patient left: in bed;with call bell/phone within reach;with family/visitor present (with transport going to x-ray)   Time: 1610-9604 OT Time Calculation (min): 34 min Charges:  OT General Charges $OT Visit: 1 Procedure OT Evaluation $OT Eval Moderate Complexity: 1 Procedure OT Treatments $Self Care/Home Management : 8-22 mins G-Codes: OT G-codes **NOT FOR INPATIENT CLASS** Functional Assessment Tool Used: Clinical judgement Functional Limitation: Self care Self Care Current Status (V4098): At least 60 percent but less than 80 percent impaired, limited or restricted Self Care Goal Status (J1914): At least 40 percent but less than 60 percent impaired, limited or restricted   Gaye Alken M.S., OTR/L Pager: [phone]  10/13/2015, 9:56 AM

## 2015-10-13 NOTE — Progress Notes (Signed)
Ok to [employer] home this afternoon from ortho perspective but if IM consultants need to have her stay another day for med mngmt then no problem dc sat
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
## 2015-10-13 NOTE — Progress Notes (Signed)
Occupational Therapy Treatment Patient Details Name: [name] MRN: [id number] DOB: 1961-01-31 Today's Date: 10/13/2015    History of present illness Pt is a 55 y.o. female s/p PROXIMAL HUMERUS and humeral shaft FRACTURE NONUNION REPAIR. PMH: HTN, Neuropathy, DM 2, COPD, CHF, Bipolar disorder, Anxiety, Depression.    OT comments  Pt making slow progress toward OT goals; limited by pain and lethargy. Focus of session on R elbow, wrist, hand ROM exercises. Pt tolerated AAROM; increased pain with elbow. Pt reports decreased ROM in fingers and wrist PTA. D/c plan remains appropriate at this time. Will continue to follow acutely.    Follow Up Recommendations  Home health OT;Supervision/Assistance - 24 hour    Equipment Recommendations  3 in 1 bedside comode    Recommendations for Other Services      Precautions / Restrictions Precautions Precautions: Shoulder;Fall Type of Shoulder Precautions: Conservative protocol: NO shoulder AROM/PROM. AROM elbow, wrist, hand OK.  Shoulder Interventions: Shoulder sling/immobilizer;At all times Precaution Comments: Reviewed precautions with pt. Required Braces or Orthoses: Sling Restrictions Weight Bearing Restrictions: Yes RUE Weight Bearing: Weight bearing as tolerated (orders for WBAT, maintained NWB throughout)       Mobility Bed Mobility        General bed mobility comments: Not assessed at this time.  Transfers                 General transfer comment: Not assessed at this time.    Balance                                   ADL Overall ADL's : Needs assistance/impaired                                       General ADL Comments: Pt more lethargic this afternoon, reports she feels dizzy every time she gets up and almost passed our earlier with nursing; RN confirmed. Educated on edema management and R elbow, wrist, hand exercises; pt verbalized understanding.      Vision                      Perception     Praxis      Cognition   Behavior During Therapy: Impulsive Overall Cognitive Status: Within Functional Limits for tasks assessed                       Extremity/Trunk Assessment  Upper Extremity Assessment Upper Extremity Assessment: Defer to OT evaluation   Lower Extremity Assessment Lower Extremity Assessment: Generalized weakness   Cervical / Trunk Assessment Cervical / Trunk Assessment: Kyphotic;Other exceptions (reports neck pain in sitting)    Exercises Shoulder Exercises Elbow Flexion: AAROM;Right;10 reps;Supine (10-30 degrees) Wrist Flexion: AAROM;Right;10 reps;Supine (limited ROM--pt reports decreased AROM at baseline) Digit Composite Flexion: AAROM;Right;10 reps;Supine (limited ROM--pt reports decreased AROM at baseline)   Shoulder Instructions       General Comments      Pertinent Vitals/ Pain       Pain Assessment: Faces Faces Pain Scale: Hurts little more Pain Location: R shoulder with movement Pain Descriptors / Indicators: Grimacing Pain Intervention(s): Limited activity within patient's tolerance;Monitored during session;Repositioned;Ice applied  Home Living Family/patient expects to be discharged to:: Private residence Living Arrangements: Spouse/significant other Available Help at Discharge: Family;Available 24 hours/day Type of  Home: Apartment Home Access: Level entry     Home Layout: One level     Bathroom Shower/Tub: Chief Strategy Officer: Standard Bathroom Accessibility: Yes   Home Equipment: Environmental consultant - 2 wheels;Cane - single point;Shower seat;Grab bars - tub/shower;Hand held shower head;Wheelchair - Careers adviser (comment) (hemi walker)          Prior Functioning/Environment        Frequency Min 3X/week     Progress Toward Goals  OT Goals(current goals can now be found in the care plan section)  Progress towards OT goals: Progressing toward goals  Acute Rehab OT  Goals Patient Stated Goal: none stated OT Goal Formulation: With patient  Plan Discharge plan remains appropriate    Co-evaluation                 End of Session Equipment Utilized During Treatment: Other (comment);Oxygen (sling)   Activity Tolerance Patient limited by lethargy;Patient limited by pain   Patient Left in bed;with call bell/phone within reach   Nurse Communication Mobility status        Time: 0981-1914 OT Time Calculation (min): 16 min  Charges: OT General Charges $OT Visit: 1 Procedure OT Treatments $Therapeutic Exercise: 8-22 mins  Gaye Alken M.S., OTR/L Pager: [phone]  10/13/2015, 3:43 PM

## 2015-10-13 NOTE — Evaluation (Signed)
Physical Therapy Evaluation Patient Details Name: [name] MRN: [id number] DOB: June 18, 1961 Today's Date: 10/13/2015   History of Present Illness  Pt is a 55 y.o. female s/p PROXIMAL HUMERUS and humeral shaft FRACTURE NONUNION REPAIR. PMH: HTN, Neuropathy, DM 2, COPD, CHF, Bipolar disorder, Anxiety, Depression.   Clinical Impression  Pt is very unsteady on her feet and unable to attempt gait today due to pain and weakness.  She would be safest to d/c to SNF before return home.   PT to follow acutely for deficits listed below.       Follow Up Recommendations SNF    Equipment Recommendations  None recommended by PT    Recommendations for Other Services   NA    Precautions / Restrictions Precautions Precautions: Shoulder;Fall Type of Shoulder Precautions: Conservative protocol: NO shoulder AROM/PROM. AROM elbow, wrist, hand OK.  Shoulder Interventions: Shoulder sling/immobilizer;At all times Precaution Comments: Reviewed precautions with pt. Required Braces or Orthoses: Sling Restrictions Weight Bearing Restrictions: Yes RUE Weight Bearing: Weight bearing as tolerated (orders for WBAT, but in sling, so NWB throughout PT eval)      Mobility  Bed Mobility Overal bed mobility: Needs Assistance Bed Mobility: Rolling;Sidelying to Sit Rolling: Min assist Sidelying to sit: Min assist       General bed mobility comments: Min assist to help pt progress to complete sidelying on her left and min assist to support trunk to help transition to sitting EOB.  Pt with heavy reliance on left arm and bed rail to preform mobility.   Transfers Overall transfer level: Needs assistance Equipment used: 2 person hand held assist Transfers: Sit to/from [employer] Sit to Stand: Mod assist Stand pivot transfers: Mod assist       General transfer comment: Mod assist to support trunk for balance during transitions. Pt relying heavily on free left hand to grab for arm rests  of chair or BSC during transfer.  Per pt, at baseline, she uses a hemi walker.    Ambulation/Gait             General Gait Details: NT due to pt unable at this time due to pain and weakness.          Balance Overall balance assessment: Needs assistance Sitting-balance support: Feet supported;Single extremity supported Sitting balance-Leahy Scale: Fair     Standing balance support: Single extremity supported Standing balance-Leahy Scale: Poor                               Pertinent Vitals/Pain Pain Assessment: Faces Pain Score: 8  Faces Pain Scale: Hurts whole lot Pain Location: right shoulder Pain Descriptors / Indicators: Guarding;Grimacing Pain Intervention(s): Limited activity within patient's tolerance;Monitored during session;Repositioned    Home Living Family/patient expects to be discharged to:: Private residence Living Arrangements: Spouse/significant other Available Help at Discharge: Family;Available 24 hours/day Type of Home: Apartment Home Access: Level entry     Home Layout: One level Home Equipment: Walker - 2 wheels;Cane - single point;Shower seat;Grab bars - tub/shower;Hand held shower head;Wheelchair - manual;Other (comment) (hemi walker)      Prior Function Level of Independence: Needs assistance   Gait / Transfers Assistance Needed: Using hemi walker for functional mobility in home and in community.  Per pt report, she does not walk far, but what walking she does in unassisted (i.e. family doesn't have to help her).   ADL's / Homemaking Assistance Needed: Has a home health  aide that assists with bathing and dressing. 3x per week for 2-3 hours.         Hand Dominance   Dominant Hand: Left    Extremity/Trunk Assessment   Upper Extremity Assessment: Defer to OT evaluation           Lower Extremity Assessment: Generalized weakness      Cervical / Trunk Assessment: Kyphotic;Other exceptions (reports neck pain in  sitting)  Communication   Communication: No difficulties  Cognition Arousal/Alertness: Awake/alert Behavior During Therapy: Impulsive Overall Cognitive Status: Within Functional Limits for tasks assessed                         Exercises Shoulder Exercises Elbow Flexion: AAROM;Right;10 reps;Supine (10-30 degrees) Wrist Flexion: AAROM;Right;10 reps;Supine (limited ROM--pt reports decreased AROM at baseline) Digit Composite Flexion: AAROM;Right;10 reps;Supine (limited ROM--pt reports decreased AROM at baseline)      Assessment/Plan    PT Assessment Patient needs continued PT services  PT Diagnosis Difficulty walking;Abnormality of gait;Generalized weakness;Acute pain   PT Problem List Decreased strength;Decreased range of motion;Decreased activity tolerance;Decreased balance;Decreased mobility;Decreased knowledge of use of DME;Pain  PT Treatment Interventions DME instruction;Gait training;Functional mobility training;Therapeutic activities;Therapeutic exercise;Balance training;Patient/family education;Neuromuscular re-education;Modalities   PT Goals (Current goals can be found in the Care Plan section) Acute Rehab PT Goals Patient Stated Goal: pt and family would like to go home at discharge PT Goal Formulation: With patient Time For Goal Achievement: 10/20/15 Potential to Achieve Goals: Good    Frequency Min 5X/week           End of Session Equipment Utilized During Treatment: Other (comment) (sling right arm) Activity Tolerance: Patient limited by pain Patient left: in bed;with call bell/phone within reach;with bed alarm set Nurse Communication: Mobility status    Functional Assessment Tool Used: assist level Functional Limitation: Mobility: Walking and moving around Mobility: Walking and Moving Around Current Status (W0981): At least 40 percent but less than 60 percent impaired, limited or restricted Mobility: Walking and Moving Around Goal Status [phone]): At  least 1 percent but less than 20 percent impaired, limited or restricted    Time: 1226-1247 PT Time Calculation (min) (ACUTE ONLY): 21 min   Charges:   PT Evaluation $PT Eval Moderate Complexity: 1 Procedure     PT G Codes:   PT G-Codes **NOT FOR INPATIENT CLASS** Functional Assessment Tool Used: assist level Functional Limitation: Mobility: Walking and moving around Mobility: Walking and Moving Around Current Status (W2956): At least 40 percent but less than 60 percent impaired, limited or restricted Mobility: Walking and Moving Around Goal Status [phone]): At least 1 percent but less than 20 percent impaired, limited or restricted    Octivia Canion B. Laith Antonelli, PT, DPT [phone]   10/13/2015, 5:38 PM

## 2015-10-14 DIAGNOSIS — S42301A Unspecified fracture of shaft of humerus, right arm, initial encounter for closed fracture: Secondary | ICD-10-CM | POA: Diagnosis not present

## 2015-10-14 LAB — BASIC METABOLIC PANEL
Anion gap: 10 (ref 5–15)
BUN: 32 mg/dL — AB (ref 6–20)
CALCIUM: 8.5 mg/dL — AB (ref 8.9–10.3)
CO2: 26 mmol/L (ref 22–32)
Chloride: 100 mmol/L — ABNORMAL LOW (ref 101–111)
Creatinine, Ser: 1.33 mg/dL — ABNORMAL HIGH (ref 0.44–1.00)
GFR calc Af Amer: 51 mL/min — ABNORMAL LOW (ref >60)
GFR, EST NON AFRICAN AMERICAN: 44 mL/min — AB (ref >60)
GLUCOSE: 116 mg/dL — AB (ref 65–99)
Potassium: 5 mmol/L (ref 3.5–5.1)
Sodium: 136 mmol/L (ref 135–145)

## 2015-10-14 LAB — HEMOGLOBIN A1C
HEMOGLOBIN A1C: 8.4 % — AB (ref 4.8–5.6)
MEAN PLASMA GLUCOSE: 194 mg/dL

## 2015-10-14 LAB — GLUCOSE, CAPILLARY
GLUCOSE-CAPILLARY: 224 mg/dL — AB (ref 65–99)
Glucose-Capillary: 100 mg/dL — ABNORMAL HIGH (ref 65–99)
Glucose-Capillary: 151 mg/dL — ABNORMAL HIGH (ref 65–99)

## 2015-10-14 MED ORDER — INSULIN GLARGINE 100 UNIT/ML ~~LOC~~ SOLN: 60.0000 [IU] | Freq: Every day | SUBCUTANEOUS | Status: DC

## 2015-10-14 NOTE — Discharge Summary (Signed)
Physician Discharge Summary  Patient ID: [name] MRN: [id number] DOB/AGE: 11/10/1960 55 y.o.  Admit date: 10/12/2015 Discharge date: 10/14/2015  Admission Diagnoses: Right humerus fracture  Discharge Diagnoses:  Principal Problem:   Diabetes mellitus, insulin dependent (IDDM), uncontrolled (HCC) Active Problems:   HTN (hypertension)   COPD (chronic obstructive pulmonary disease) (HCC)   Moderate mitral stenosis   AKI (acute kidney injury) (HCC)   Hypothyroidism   Bipolar I disorder (HCC)   Humeral shaft fracture   Chronic diastolic heart failure (HCC)   DM type 2, uncontrolled, with renal complications [facility])   Discharged Condition: stable  Hospital Course: Patient's hospital course was essentially unremarkable. She underwent open reduction internal fixation of her humerus fracture as well as management for diabetes. Postoperatively patient progressed well and was discharged to home in stable condition.  Consults: Hospitalist  Significant Diagnostic Studies: labs: Routine labs  Treatments: surgery: See operative note  Discharge Exam: Blood pressure 108/60, pulse 91, temperature 98.5 F (36.9 C), temperature source Oral, resp. rate 17, height  (1.549 m), weight 90.266 kg (199 lb), SpO2 95 %. Incision/Wound: incision clean and dry  Disposition: 01-Home or Self Care  Discharge Instructions    Call MD / Call 911    Complete by:  As directed   If you experience chest pain or shortness of breath, CALL 911 and be transported to the hospital emergency room.  If you develope a fever above 101 F, pus (white drainage) or increased drainage or redness at the wound, or calf pain, call your surgeon's office.     Call MD / Call 911    Complete by:  As directed   If you experience chest pain or shortness of breath, CALL 911 and be transported to the hospital emergency room.  If you develope a fever above 101 F, pus (white drainage) or increased drainage or redness at the  wound, or calf pain, call your surgeon's office.     Constipation Prevention    Complete by:  As directed   Drink plenty of fluids.  Prune juice may be helpful.  You may use a stool softener, such as Colace (over the counter) 100 mg twice a day.  Use MiraLax (over the counter) for constipation as needed.     Constipation Prevention    Complete by:  As directed   Drink plenty of fluids.  Prune juice may be helpful.  You may use a stool softener, such as Colace (over the counter) 100 mg twice a day.  Use MiraLax (over the counter) for constipation as needed.     Diet - low sodium heart healthy    Complete by:  As directed      Diet - low sodium heart healthy    Complete by:  As directed      Discharge instructions    Complete by:  As directed   Keep arm in sling Keep dressing in place No lifting with right arm     Increase activity slowly as tolerated    Complete by:  As directed      Increase activity slowly as tolerated    Complete by:  As directed             Medication List    STOP taking these medications        meloxicam 7.5 MG tablet  Commonly known as:  MOBIC      TAKE these medications        acetaminophen 325 MG  tablet  Commonly known as:  TYLENOL  Take 650 mg by mouth every 6 (six) hours as needed (pain).     ALPRAZolam 0.5 MG tablet  Commonly known as:  XANAX  Take 0.5 mg by mouth 2 (two) times daily.     ARIPiprazole 5 MG tablet  Commonly known as:  ABILIFY  Take 5 mg by mouth daily.     aspirin EC 81 MG tablet  Take 1 tablet (81 mg total) by mouth daily.     buPROPion 300 MG 24 hr tablet  Commonly known as:  WELLBUTRIN XL  Take 300 mg by mouth daily.     cetirizine 10 MG tablet  Commonly known as:  ZYRTEC  Take 10 mg by mouth daily.     furosemide 40 MG tablet  Commonly known as:  LASIX  Take 40 mg by mouth daily.     gabapentin 300 MG capsule  Commonly known as:  NEURONTIN  Take 300 mg by mouth 3 (three) times daily.     glimepiride 4 MG  tablet  Commonly known as:  AMARYL  Take 4 mg by mouth daily.     hydrochlorothiazide 25 MG tablet  Commonly known as:  HYDRODIURIL  Take 25 mg by mouth daily.     HYDROcodone-acetaminophen 5-325 MG tablet  Commonly known as:  NORCO/VICODIN  Take 1 tablet by mouth every 12 (twelve) hours as needed for moderate pain.     HYDROcodone-acetaminophen 10-325 MG tablet  Commonly known as:  NORCO  Take 1-2 tablets by mouth every 4 (four) hours as needed (breakthrough pain).     insulin glargine 100 unit/mL Sopn  Commonly known as:  LANTUS  Inject 60 Units into the skin at bedtime.     insulin glargine 100 UNIT/ML injection  Commonly known as:  LANTUS  Inject 0.6 mLs (60 Units total) into the skin daily.     INVOKANA 100 MG Tabs tablet  Generic drug:  canagliflozin  Take 100 mg by mouth daily.     levothyroxine 100 MCG tablet  Commonly known as:  SYNTHROID, LEVOTHROID  Take 1 tablet (100 mcg total) by mouth daily before breakfast.     losartan 100 MG tablet  Commonly known as:  COZAAR  Take 100 mg by mouth daily.     methocarbamol 500 MG tablet  Commonly known as:  ROBAXIN  Take 500 mg by mouth 2 (two) times daily as needed for muscle spasms.     metoprolol tartrate 25 MG tablet  Commonly known as:  LOPRESSOR  Take 0.5 tablets (12.5 mg total) by mouth 2 (two) times daily.     NOVOLOG FLEXPEN 100 UNIT/ML FlexPen  Generic drug:  insulin aspart  Inject 6-15 Units into the skin 3 (three) times daily as needed for high blood sugar (CBG >100). CBG 100-200 6-8 units, >200 15 units     potassium chloride SA 20 MEQ tablet  Commonly known as:  K-DUR,KLOR-CON  Take 20 meq BID X 3 days then decrease back to 20 meq once daily.     PROAIR HFA 108 (90 Base) MCG/ACT inhaler  Generic drug:  albuterol  Inhale 2 puffs into the lungs every 6 (six) hours as needed for wheezing or shortness of breath.     simvastatin 10 MG tablet  Commonly known as:  ZOCOR  Take 10 mg by mouth daily.      SM OMEPRAZOLE 20 MG Tbec  Generic drug:  Omeprazole  Take 20 mg by mouth  daily.           Follow-up Information    Follow up with Cammy Copa, MD In 1 week.   Specialty:  Orthopedic Surgery   Contact information:   [address] [place] [address] [phone]       Signed: Nadara Mustard 10/14/2015, 8:10 AM

## 2015-10-14 NOTE — Progress Notes (Signed)
Pt is being discharged home. Discharge instructions were given to patient and family. Discharge instructions included but not limited to  Sign and symptoms of infection, when to call the doctor and follow up appoitment were discussed with the patient. 

## 2015-10-14 NOTE — Care Management Note (Signed)
Case Management Note  Patient Details  Name: [name] MRN: [id number] Date of Birth: 03-16-1961  Subjective/Objective:    55 yr old female s/p   Right humerl shaft repair        Action/Plan: Case manager spoke with patient concerning DME needs. She has a rolling walker at home, 3in1 will be ordered. Patient unfortunately is not eligible for Home Health under medicaid guidelines. Has family support at discharge. Patient's husband has questions concerning loss of food stamps, requested to speak with social worker, CM will contact weekend social worker.    Expected Discharge Date:    10/14/15              Expected Discharge Plan:    Home self Care In-House Referral:     Discharge planning Services  CM Consult  Post Acute Care Choice:  Durable Medical Equipment Choice offered to:     DME Arranged:  3-N-1 DME Agency:  [facility]  HH Arranged:  NA HH Agency:  NA  Status of Service:  Completed, signed off  Medicare Important Message Given:    Date Medicare IM Given:    Medicare IM give by:    Date Additional Medicare IM Given:    Additional Medicare Important Message give by:     If discussed at Long Length of Stay Meetings, dates discussed:    Additional Comments:  Durenda Guthrie, RN 10/14/2015, 9:15 AM

## 2015-10-14 NOTE — Progress Notes (Signed)
Occupational Therapy Treatment Patient Details Name: [name] MRN: [id number] DOB: Jun 25, 1961 Today's Date: 10/14/2015    History of present illness Pt is a 55 y.o. female s/p PROXIMAL HUMERUS and humeral shaft FRACTURE NONUNION REPAIR. PMH: HTN, Neuropathy, DM 2, COPD, CHF, Bipolar disorder, Anxiety, Depression.    OT comments  Pt. Able to return demo of digit, wrist, and elbow exercises.  Reviewed proper positioning of sling and elevation.  Answered all pt. And spouse questions.  They are eager for d/c home today.  State they will have to have 3n1 for home.  Discussed with r.n.    Follow Up Recommendations  Home health OT;Supervision/Assistance - 24 hour    Equipment Recommendations  3 in 1 bedside comode    Recommendations for Other Services      Precautions / Restrictions         Mobility Bed Mobility                  Transfers                      Balance                                   ADL                                                Vision                     Perception     Praxis      Cognition   Behavior During Therapy: WFL for tasks assessed/performed Overall Cognitive Status: Within Functional Limits for tasks assessed                       Extremity/Trunk Assessment               Exercises Shoulder Exercises Elbow Flexion: AAROM;Right;10 reps;Supine Wrist Flexion: AAROM;Right;10 reps;Supine Digit Composite Flexion: AAROM;Right;10 reps;Supine   Shoulder Instructions       General Comments      Pertinent Vitals/ Pain       Pain Assessment: No/denies pain  Home Living                                          Prior Functioning/Environment              Frequency Min 3X/week     Progress Toward Goals  OT Goals(current goals can now be found in the care plan section)  Progress towards OT goals: Progressing toward goals     Plan  Discharge plan remains appropriate    Co-evaluation                 End of Session     Activity Tolerance Patient tolerated treatment well   Patient Left in bed;with call bell/phone within reach;with family/visitor present   Nurse Communication Other (comment) (pt./spouse request to make sure 3n1 is delivered to room and that d/c needs to be early am)        Time: 4098-1191 OT Time Calculation (min): 23 min  Charges: OT General Charges $OT  Visit: 1 Procedure OT Treatments $Therapeutic Exercise: 23-37 mins  [name],COTA/L 10/14/2015, 8:16 AM

## 2015-10-14 NOTE — Clinical Social Work Note (Signed)
Clinical Social Work Assessment  Patient Details  Name: [name] MRN: [id number] Date of Birth: Jul 31, 1961  Date of referral:  10/14/15               Reason for consult:  [facility]                Permission sought to share information with:  Family Supports Permission granted to share information::  Yes, Verbal Permission Granted  Name::     Teacher, adult education::     Relationship::  boyfriend  Contact Information:     Housing/Transportation Living arrangements for the past 2 months:  Single Family Home Source of Information:  Patient Patient Interpreter Needed:  None Criminal Activity/Legal Involvement Pertinent to Current Situation/Hospitalization:  No - Comment as needed Significant Relationships:  Significant Other Lives with:  Significant Other Do you feel safe going back to the place where you live?  Yes Need for family participation in patient care:  No (Coment)  Care giving concerns:  Pt plans to return home with boyfriend.   Social Worker assessment / plan:  CSW received referral to assist with commuity resources. Pt and boyfriend explained that pt was in the hospital and her food stamps were not renewed. Pt inquired if food stamps were lost due to boyfriend living with her. CSW explained that CSW works for the hospital and that pt would need to speak with case worker at [facility]. CSW provided "[employer]" and food pantry list to assist with obtaining food until pt is able to speak with DSS.  CSW is signing off but available if needed.  Employment status:  Disabled (Comment on whether or not currently receiving Disability) Insurance information:  Medicaid [place] PT Recommendations:  Home with Home Health Information / Referral to community resources:  Other (Comment Required) (Food resources)  Patient/Family's Response to care:  Pt and boyfriend engaged and appreciative of information.  Patient/Family's Understanding of and  Emotional Response to Diagnosis, Current Treatment, and Prognosis:  Pt reports she is going home but unsure when she will feel well enough to go to DSS. Pt and boyfriend aware they will need to speak with DSS.  Emotional Assessment Appearance:  Appears stated age Attitude/Demeanor/Rapport:  Other (Cooperative) Affect (typically observed):  Calm Orientation:  Oriented to Self, Oriented to Place, Oriented to  Time, Oriented to Situation Alcohol / Substance use:  Not Applicable Psych involvement (Current and /or in the community):  No (Comment)  Discharge Needs  Concerns to be addressed:  No discharge needs identified Readmission within the last 30 days:  Yes Current discharge risk:  None Barriers to Discharge:  No Barriers Identified   Marnee Spring, LCSW 10/14/2015, 10:18 AM Weekend Coverage

## 2015-10-16 ENCOUNTER — Encounter (HOSPITAL_COMMUNITY): Payer: Self-pay

## 2015-10-16 ENCOUNTER — Emergency Department (HOSPITAL_COMMUNITY): Payer: Medicaid Other

## 2015-10-16 ENCOUNTER — Inpatient Hospital Stay (HOSPITAL_COMMUNITY)
Admission: EM | Admit: 2015-10-16 | Discharge: 2015-10-17 | DRG: 917 | Disposition: A | Discharge status: Home / Self Care | Payer: Medicaid Other | Attending: Internal Medicine | Admitting: Internal Medicine

## 2015-10-16 DIAGNOSIS — R079 Chest pain, unspecified: Secondary | ICD-10-CM | POA: Diagnosis present

## 2015-10-16 DIAGNOSIS — T424X1A Poisoning by benzodiazepines, accidental (unintentional), initial encounter: Secondary | ICD-10-CM | POA: Diagnosis present

## 2015-10-16 DIAGNOSIS — R330 Drug induced retention of urine: Secondary | ICD-10-CM | POA: Diagnosis present

## 2015-10-16 DIAGNOSIS — Z7982 Long term (current) use of aspirin: Secondary | ICD-10-CM | POA: Diagnosis not present

## 2015-10-16 DIAGNOSIS — I110 Hypertensive heart disease with heart failure: Secondary | ICD-10-CM | POA: Diagnosis present

## 2015-10-16 DIAGNOSIS — E1121 Type 2 diabetes mellitus with diabetic nephropathy: Secondary | ICD-10-CM | POA: Diagnosis present

## 2015-10-16 DIAGNOSIS — IMO0002 Reserved for concepts with insufficient information to code with codable children: Secondary | ICD-10-CM | POA: Diagnosis present

## 2015-10-16 DIAGNOSIS — Z87891 Personal history of nicotine dependence: Secondary | ICD-10-CM | POA: Diagnosis not present

## 2015-10-16 DIAGNOSIS — I10 Essential (primary) hypertension: Secondary | ICD-10-CM | POA: Diagnosis present

## 2015-10-16 DIAGNOSIS — E1129 Type 2 diabetes mellitus with other diabetic kidney complication: Secondary | ICD-10-CM | POA: Diagnosis present

## 2015-10-16 DIAGNOSIS — D649 Anemia, unspecified: Secondary | ICD-10-CM

## 2015-10-16 DIAGNOSIS — Z8249 Family history of ischemic heart disease and other diseases of the circulatory system: Secondary | ICD-10-CM | POA: Diagnosis not present

## 2015-10-16 DIAGNOSIS — Z9851 Tubal ligation status: Secondary | ICD-10-CM | POA: Diagnosis not present

## 2015-10-16 DIAGNOSIS — G92 Toxic encephalopathy: Secondary | ICD-10-CM | POA: Diagnosis present

## 2015-10-16 DIAGNOSIS — I252 Old myocardial infarction: Secondary | ICD-10-CM | POA: Diagnosis not present

## 2015-10-16 DIAGNOSIS — E1165 Type 2 diabetes mellitus with hyperglycemia: Secondary | ICD-10-CM | POA: Diagnosis present

## 2015-10-16 DIAGNOSIS — G8929 Other chronic pain: Secondary | ICD-10-CM | POA: Diagnosis present

## 2015-10-16 DIAGNOSIS — M549 Dorsalgia, unspecified: Secondary | ICD-10-CM | POA: Diagnosis present

## 2015-10-16 DIAGNOSIS — Z79899 Other long term (current) drug therapy: Secondary | ICD-10-CM | POA: Diagnosis not present

## 2015-10-16 DIAGNOSIS — Z888 Allergy status to other drugs, medicaments and biological substances status: Secondary | ICD-10-CM

## 2015-10-16 DIAGNOSIS — E872 Acidosis: Secondary | ICD-10-CM | POA: Diagnosis present

## 2015-10-16 DIAGNOSIS — I5032 Chronic diastolic (congestive) heart failure: Secondary | ICD-10-CM | POA: Diagnosis present

## 2015-10-16 DIAGNOSIS — Z9889 Other specified postprocedural states: Secondary | ICD-10-CM

## 2015-10-16 DIAGNOSIS — Z801 Family history of malignant neoplasm of trachea, bronchus and lung: Secondary | ICD-10-CM

## 2015-10-16 DIAGNOSIS — Z794 Long term (current) use of insulin: Secondary | ICD-10-CM

## 2015-10-16 DIAGNOSIS — J449 Chronic obstructive pulmonary disease, unspecified: Secondary | ICD-10-CM | POA: Diagnosis present

## 2015-10-16 DIAGNOSIS — Z8701 Personal history of pneumonia (recurrent): Secondary | ICD-10-CM

## 2015-10-16 DIAGNOSIS — M1990 Unspecified osteoarthritis, unspecified site: Secondary | ICD-10-CM | POA: Diagnosis present

## 2015-10-16 DIAGNOSIS — K219 Gastro-esophageal reflux disease without esophagitis: Secondary | ICD-10-CM | POA: Diagnosis present

## 2015-10-16 DIAGNOSIS — R4182 Altered mental status, unspecified: Secondary | ICD-10-CM | POA: Diagnosis present

## 2015-10-16 DIAGNOSIS — Z823 Family history of stroke: Secondary | ICD-10-CM | POA: Diagnosis not present

## 2015-10-16 DIAGNOSIS — N289 Disorder of kidney and ureter, unspecified: Secondary | ICD-10-CM | POA: Diagnosis present

## 2015-10-16 DIAGNOSIS — N179 Acute kidney failure, unspecified: Secondary | ICD-10-CM | POA: Diagnosis present

## 2015-10-16 DIAGNOSIS — E039 Hypothyroidism, unspecified: Secondary | ICD-10-CM | POA: Diagnosis present

## 2015-10-16 LAB — COMPREHENSIVE METABOLIC PANEL
ALBUMIN: 2.9 g/dL — AB (ref 3.5–5.0)
ALT: 6 U/L — ABNORMAL LOW (ref 14–54)
ANION GAP: 10 (ref 5–15)
AST: 22 U/L (ref 15–41)
Alkaline Phosphatase: 87 U/L (ref 38–126)
BILIRUBIN TOTAL: 0.7 mg/dL (ref 0.3–1.2)
BUN: 38 mg/dL — AB (ref 6–20)
CALCIUM: 8.4 mg/dL — AB (ref 8.9–10.3)
CHLORIDE: 99 mmol/L — AB (ref 101–111)
CO2: 26 mmol/L (ref 22–32)
Creatinine, Ser: 2.04 mg/dL — ABNORMAL HIGH (ref 0.44–1.00)
GFR calc Af Amer: 30 mL/min — ABNORMAL LOW (ref >60)
GFR calc non Af Amer: 26 mL/min — ABNORMAL LOW (ref >60)
GLUCOSE: 99 mg/dL (ref 65–99)
POTASSIUM: 4.1 mmol/L (ref 3.5–5.1)
Sodium: 135 mmol/L (ref 135–145)
TOTAL PROTEIN: 5.6 g/dL — AB (ref 6.5–8.1)

## 2015-10-16 LAB — I-STAT CHEM 8, ED
BUN: 39 mg/dL — ABNORMAL HIGH (ref 6–20)
CHLORIDE: 97 mmol/L — AB (ref 101–111)
Calcium, Ion: 1.09 mmol/L — ABNORMAL LOW (ref 1.12–1.23)
Creatinine, Ser: 1.9 mg/dL — ABNORMAL HIGH (ref 0.44–1.00)
GLUCOSE: 93 mg/dL (ref 65–99)
HEMATOCRIT: 28 % — AB (ref 36.0–46.0)
HEMOGLOBIN: 9.5 g/dL — AB (ref 12.0–15.0)
POTASSIUM: 4 mmol/L (ref 3.5–5.1)
SODIUM: 134 mmol/L — AB (ref 135–145)
TCO2: 26 mmol/L (ref 0–100)

## 2015-10-16 LAB — URINALYSIS, ROUTINE W REFLEX MICROSCOPIC
Bilirubin Urine: NEGATIVE
GLUCOSE, UA: 250 mg/dL — AB
Hgb urine dipstick: NEGATIVE
KETONES UR: NEGATIVE mg/dL
NITRITE: NEGATIVE
PROTEIN: NEGATIVE mg/dL
Specific Gravity, Urine: 1.018 (ref 1.005–1.030)
pH: 5 (ref 5.0–8.0)

## 2015-10-16 LAB — CBC WITH DIFFERENTIAL/PLATELET
BASOS ABS: 0 10*3/uL (ref 0.0–0.1)
BASOS PCT: 0 %
EOS ABS: 0.3 10*3/uL (ref 0.0–0.7)
EOS PCT: 3 %
HCT: 26 % — ABNORMAL LOW (ref 36.0–46.0)
Hemoglobin: 8.4 g/dL — ABNORMAL LOW (ref 12.0–15.0)
Lymphocytes Relative: 15 %
Lymphs Abs: 1.5 10*3/uL (ref 0.7–4.0)
MCH: 27.6 pg (ref 26.0–34.0)
MCHC: 32.3 g/dL (ref 30.0–36.0)
MCV: 85.5 fL (ref 78.0–100.0)
MONO ABS: 0.7 10*3/uL (ref 0.1–1.0)
MONOS PCT: 7 %
Neutro Abs: 7.4 10*3/uL (ref 1.7–7.7)
Neutrophils Relative %: 75 %
PLATELETS: 280 10*3/uL (ref 150–400)
RBC: 3.04 MIL/uL — ABNORMAL LOW (ref 3.87–5.11)
RDW: 18 % — AB (ref 11.5–15.5)
WBC: 9.8 10*3/uL (ref 4.0–10.5)

## 2015-10-16 LAB — URINE MICROSCOPIC-ADD ON: RBC / HPF: NONE SEEN RBC/hpf (ref 0–5)

## 2015-10-16 LAB — I-STAT ARTERIAL BLOOD GAS, ED
ACID-BASE DEFICIT: 3 mmol/L — AB (ref 0.0–2.0)
Bicarbonate: 24.4 mEq/L — ABNORMAL HIGH (ref 20.0–24.0)
O2 SAT: 95 %
PCO2 ART: 56.5 mmHg — AB (ref 35.0–45.0)
PH ART: 7.245 — AB (ref 7.350–7.450)
Patient temperature: 98.9
TCO2: 26 mmol/L (ref 0–100)
pO2, Arterial: 90 mmHg (ref 80.0–100.0)

## 2015-10-16 LAB — I-STAT TROPONIN, ED: TROPONIN I, POC: 0.05 ng/mL (ref 0.00–0.08)

## 2015-10-16 LAB — GLUCOSE, CAPILLARY: Glucose-Capillary: 402 mg/dL — ABNORMAL HIGH (ref 65–99)

## 2015-10-16 LAB — I-STAT CG4 LACTIC ACID, ED: LACTIC ACID, VENOUS: 0.85 mmol/L (ref 0.5–2.0)

## 2015-10-16 LAB — CBG MONITORING, ED: GLUCOSE-CAPILLARY: 97 mg/dL (ref 65–99)

## 2015-10-16 MED ORDER — SODIUM CHLORIDE 0.9 % IV SOLN
INTRAVENOUS | Status: DC
  Administered 2015-10-16: 17:00:00 via INTRAVENOUS

## 2015-10-16 MED ORDER — SODIUM CHLORIDE 0.9 % IV SOLN
INTRAVENOUS | Status: DC
  Administered 2015-10-17: 01:00:00 via INTRAVENOUS

## 2015-10-16 MED ORDER — MORPHINE SULFATE (PF) 2 MG/ML IV SOLN
1.0000 mg | INTRAVENOUS | Status: DC | PRN
  Administered 2015-10-16 – 2015-10-17 (×3): 1 mg via INTRAVENOUS
  Filled 2015-10-16 (×3): qty 1

## 2015-10-16 MED ORDER — HEPARIN SODIUM (PORCINE) 5000 UNIT/ML IJ SOLN
5000.0000 [IU] | Freq: Three times a day (TID) | INTRAMUSCULAR | Status: DC
  Administered 2015-10-16 – 2015-10-17 (×2): 5000 [IU] via SUBCUTANEOUS
  Filled 2015-10-16: qty 1

## 2015-10-16 MED ORDER — ASPIRIN EC 81 MG PO TBEC
81.0000 mg | DELAYED_RELEASE_TABLET | Freq: Every day | ORAL | Status: DC
  Administered 2015-10-16 – 2015-10-17 (×2): 81 mg via ORAL
  Filled 2015-10-16 (×2): qty 1

## 2015-10-16 MED ORDER — ONDANSETRON HCL 4 MG PO TABS: 4.0000 mg | ORAL_TABLET | Freq: Four times a day (QID) | ORAL | Status: DC | PRN

## 2015-10-16 MED ORDER — SODIUM CHLORIDE 0.9 % IJ SOLN
3.0000 mL | Freq: Two times a day (BID) | INTRAMUSCULAR | Status: DC
  Administered 2015-10-16: 3 mL via INTRAVENOUS

## 2015-10-16 MED ORDER — ARIPIPRAZOLE 5 MG PO TABS
5.0000 mg | ORAL_TABLET | Freq: Every day | ORAL | Status: DC
  Administered 2015-10-16 – 2015-10-17 (×2): 5 mg via ORAL
  Filled 2015-10-16 (×2): qty 1

## 2015-10-16 MED ORDER — GUAIFENESIN-DM 100-10 MG/5ML PO SYRP
5.0000 mL | ORAL_SOLUTION | ORAL | Status: DC | PRN
  Administered 2015-10-17: 5 mL via ORAL
  Filled 2015-10-16: qty 5

## 2015-10-16 MED ORDER — ONDANSETRON HCL 4 MG/2ML IJ SOLN: 4.0000 mg | Freq: Four times a day (QID) | INTRAMUSCULAR | Status: DC | PRN

## 2015-10-16 MED ORDER — NALOXONE HCL 0.4 MG/ML IJ SOLN
0.4000 mg | Freq: Once | INTRAMUSCULAR | Status: AC
  Administered 2015-10-16: 0.4 mg via INTRAVENOUS
  Filled 2015-10-16: qty 1

## 2015-10-16 MED ORDER — SODIUM CHLORIDE 0.9 % IV BOLUS (SEPSIS)
500.0000 mL | Freq: Once | INTRAVENOUS | Status: AC
  Administered 2015-10-16: 500 mL via INTRAVENOUS

## 2015-10-16 MED ORDER — SODIUM CHLORIDE 0.9 % IV BOLUS (SEPSIS)
250.0000 mL | Freq: Once | INTRAVENOUS | Status: AC
  Administered 2015-10-16: 250 mL via INTRAVENOUS

## 2015-10-16 MED ORDER — INSULIN ASPART 100 UNIT/ML ~~LOC~~ SOLN
0.0000 [IU] | SUBCUTANEOUS | Status: DC
  Administered 2015-10-16: 15 [IU] via SUBCUTANEOUS

## 2015-10-16 MED ORDER — METOPROLOL TARTRATE 12.5 MG HALF TABLET
12.5000 mg | ORAL_TABLET | Freq: Two times a day (BID) | ORAL | Status: DC
  Administered 2015-10-17: 12.5 mg via ORAL
  Filled 2015-10-16: qty 1

## 2015-10-16 NOTE — ED Notes (Signed)
Attempt to collect urine sample.  Pt was helped to bedpan after she told us that she could not ambulate to bathroom.  Not successful at that time
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
## 2015-10-16 NOTE — ED Notes (Signed)
No foley placed at this time as pt is able to void on her own

## 2015-10-16 NOTE — Progress Notes (Signed)
MD ordered bipap for patient.  Upon arrival to room patient was extremely nervous about going on bipap.  Finally talked patient into wearing bipap for a small amount of time.  RN aware.  Will continue to monitor patient.
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
## 2015-10-16 NOTE — Progress Notes (Signed)
Patient stated she was taken off BIPAP about ago. Patient is stable and is in no distress at this time. BIPAP not needed at this time. RT will continue to monitor

## 2015-10-16 NOTE — ED Notes (Signed)
Pt. BIB EMS for evaluation of altered mental status. Pt. Husband states he noticed slurred speech starting around dinner last night. Pt. Found to be hypotensive. Husband states pt has complaint of dizziness/weakness, elevated CBG this AM were husband. Pt. CBG 134 at this time. Pt. Here recently for arm sx

## 2015-10-17 LAB — URINE CULTURE: Culture: NO GROWTH

## 2015-10-17 LAB — BASIC METABOLIC PANEL
ANION GAP: 11 (ref 5–15)
BUN: 39 mg/dL — ABNORMAL HIGH (ref 6–20)
CHLORIDE: 99 mmol/L — AB (ref 101–111)
CO2: 26 mmol/L (ref 22–32)
Calcium: 8.1 mg/dL — ABNORMAL LOW (ref 8.9–10.3)
Creatinine, Ser: 1.59 mg/dL — ABNORMAL HIGH (ref 0.44–1.00)
GFR calc non Af Amer: 35 mL/min — ABNORMAL LOW (ref >60)
GFR, EST AFRICAN AMERICAN: 41 mL/min — AB (ref >60)
Glucose, Bld: 304 mg/dL — ABNORMAL HIGH (ref 65–99)
Potassium: 3.4 mmol/L — ABNORMAL LOW (ref 3.5–5.1)
SODIUM: 136 mmol/L (ref 135–145)

## 2015-10-17 LAB — MRSA PCR SCREENING: MRSA BY PCR: NEGATIVE

## 2015-10-17 LAB — GLUCOSE, CAPILLARY
GLUCOSE-CAPILLARY: 282 mg/dL — AB (ref 65–99)
Glucose-Capillary: 173 mg/dL — ABNORMAL HIGH (ref 65–99)

## 2015-10-17 LAB — CBC
HCT: 26.8 % — ABNORMAL LOW (ref 36.0–46.0)
HEMOGLOBIN: 8.4 g/dL — AB (ref 12.0–15.0)
MCH: 26.6 pg (ref 26.0–34.0)
MCHC: 31.3 g/dL (ref 30.0–36.0)
MCV: 84.8 fL (ref 78.0–100.0)
Platelets: 279 10*3/uL (ref 150–400)
RBC: 3.16 MIL/uL — AB (ref 3.87–5.11)
RDW: 18.1 % — ABNORMAL HIGH (ref 11.5–15.5)
WBC: 8.8 10*3/uL (ref 4.0–10.5)

## 2015-10-17 LAB — PROTIME-INR
INR: 1.15 (ref 0.00–1.49)
PROTHROMBIN TIME: 14.9 s (ref 11.6–15.2)

## 2015-10-17 LAB — TSH: TSH: 9.91 u[IU]/mL — AB (ref 0.350–4.500)

## 2015-10-17 MED ORDER — ALPRAZOLAM 0.5 MG PO TABS: 0.5000 mg | ORAL_TABLET | Freq: Every evening | ORAL | Status: DC | PRN

## 2015-10-17 MED ORDER — INSULIN ASPART 100 UNIT/ML ~~LOC~~ SOLN
0.0000 [IU] | SUBCUTANEOUS | Status: DC
  Administered 2015-10-17: 8 [IU] via SUBCUTANEOUS
  Administered 2015-10-17: 3 [IU] via SUBCUTANEOUS

## 2015-10-17 MED ORDER — LOSARTAN POTASSIUM 100 MG PO TABS
100.0000 mg | ORAL_TABLET | Freq: Every day | ORAL | Status: DC
Start: 2015-10-20 — End: 2016-03-07

## 2015-10-17 MED ORDER — POTASSIUM CHLORIDE 10 MEQ/100ML IV SOLN
10.0000 meq | INTRAVENOUS | Status: AC
  Administered 2015-10-17: 10 meq via INTRAVENOUS
  Filled 2015-10-17: qty 100

## 2015-10-17 MED ORDER — POTASSIUM CHLORIDE 20 MEQ/15ML (10%) PO SOLN
40.0000 meq | Freq: Once | ORAL | Status: AC
  Administered 2015-10-17: 40 meq via ORAL
  Filled 2015-10-17: qty 30

## 2015-10-17 MED ORDER — LEVOTHYROXINE SODIUM 25 MCG PO TABS
125.0000 ug | ORAL_TABLET | Freq: Every day | ORAL | Status: DC
  Administered 2015-10-17: 125 ug via ORAL
  Filled 2015-10-17: qty 1

## 2015-10-17 MED ORDER — LEVOTHYROXINE SODIUM 125 MCG PO TABS: 125.0000 ug | ORAL_TABLET | Freq: Every day | ORAL | Status: AC

## 2015-10-17 MED ORDER — GABAPENTIN 100 MG PO CAPS
100.0000 mg | ORAL_CAPSULE | Freq: Three times a day (TID) | ORAL | Status: DC
Start: 2015-10-17 — End: 2016-01-09

## 2015-10-17 NOTE — Progress Notes (Signed)
Inpatient Diabetes Program Recommendations  AACE/ADA: New Consensus Statement on Inpatient Glycemic Control (2015)  Target Ranges:  Prepandial:   less than 140 mg/dL      Peak postprandial:   less than 180 mg/dL (1-2 hours)      Critically ill patients:  140 - 180 mg/dL   Review of Glycemic Control:  Results for TENEA, SENS (MRN [id number]) as of 10/17/2015 10:22  Ref. Range 10/16/2015 14:44 10/16/2015 23:05 10/17/2015 03:42 10/17/2015 08:21  Glucose-Capillary Latest Ref Range: 65-99 mg/dL 97 696 (H) 295 (H) 284 (H)    Diabetes history: Type 2 diabetes Outpatient Diabetes medications: Lantus 60 units daily, Novolog Flexpen, Amaryl 4 mg daily, Invokana 100 mg daily Current orders for Inpatient glycemic control:  Moderate q 4 hours  Inpatient Diabetes Program Recommendations:    MD please consider restarting 1/2 of Lantus while patient is in the hospital.  Consider Lantus 30 units daily.  Thanks, Beryl Meager, RN, BC-ADM Inpatient Diabetes Coordinator Pager [phone] (8a-5p)
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
## 2015-10-17 NOTE — Discharge Instructions (Signed)
Follow with Primary MD Fleet Contras A, MD in 3 days   Get CBC, CMP, 2 view Chest X ray checked  by Primary MD next visit.    Activity: As tolerated with Full fall precautions use walker/cane & assistance as needed   Disposition Home     Diet:   Heart Healthy Low Carb, with feeding assistance and aspiration precautions.  Accuchecks 4 times/day, Once in AM empty stomach and then before each meal. Log in all results and show them to your Prim.MD in 3 days. If any glucose reading is under 80 or above 300 call your Prim MD immidiately. Follow Low glucose instructions for glucose under 80 as instructed.   For Heart failure patients - Check your Weight same time everyday, if you gain over 2 pounds, or you develop in leg swelling, experience more shortness of breath or chest pain, call your Primary MD immediately. Follow Cardiac Low Salt Diet and 1.5 lit/day fluid restriction.   On your next visit with your primary care physician please Get Medicines reviewed and adjusted.   Please request your Prim.MD to go over all Hospital Tests and Procedure/Radiological results at the follow up, please get all Hospital records sent to your Prim MD by signing hospital release before you go home.   If you experience worsening of your admission symptoms, develop shortness of breath, life threatening emergency, suicidal or homicidal thoughts you must seek medical attention immediately by calling 911 or calling your MD immediately  if symptoms less severe.  You Must read complete instructions/literature along with all the possible adverse reactions/side effects for all the Medicines you take and that have been prescribed to you. Take any new Medicines after you have completely understood and accpet all the possible adverse reactions/side effects.   Do not drive, operating heavy machinery, perform activities at heights, swimming or participation in water activities or provide baby sitting services if your  were admitted for syncope or siezures until you have seen by Primary MD or a Neurologist and advised to do so again.  Do not drive when taking Pain medications.    Do not take more than prescribed Pain, Sleep and Anxiety Medications  Special Instructions: If you have smoked or chewed Tobacco  in the last 2 yrs please stop smoking, stop any regular Alcohol  and or any Recreational drug use.  Wear Seat belts while driving.   Please note  You were cared for by a hospitalist during your hospital stay. If you have any questions about your discharge medications or the care you received while you were in the hospital after you are discharged, you can call the unit and asked to speak with the hospitalist on call if the hospitalist that took care of you is not available. Once you are discharged, your primary care physician will handle any further medical issues. Please note that NO REFILLS for any discharge medications will be authorized once you are discharged, as it is imperative that you return to your primary care physician (or establish a relationship with a primary care physician if you do not have one) for your aftercare needs so that they can reassess your need for medications and monitor your lab values.

## 2015-10-17 NOTE — Evaluation (Signed)
Physical Therapy Evaluation Patient Details Name: [name] MRN: [id number] DOB: [birth year] Today's Date: 10/17/2015   History of Present Illness  [name] is a 55 y.o. female, with history of morbid obesity, COPD not on oxygen, chronic diastolic CHF, essential hypertension, dyslipidemia, diabetes mellitus type 2 insulin-dependent, diabetic nephropathy, who recently had right shoulder surgery done by Dr. Lajoyce Corners and discharged a few days ago brought in by husband for somnolence and confusion.   Clinical Impression  Pt admitted with above diagnosis. Pt currently with functional limitations due to the deficits listed below (see PT Problem List). Pt was able to stand and pivot to 3N1 and to recliner with +1 assist.  Will have +1 assist at home and appears close to baseline.  Pt to go home with husband as CM called him and he agrees he is ready for her to come home.  Will follow acutely if anything changes.   Pt will benefit from skilled PT to increase their independence and safety with mobility to allow discharge to the venue listed below.      Follow Up Recommendations No PT follow up (Will not have therapy at home due to [facility])    Equipment Recommendations  None recommended by PT    Recommendations for Other Services       Precautions / Restrictions Precautions Precautions: Shoulder;Fall Type of Shoulder Precautions: Conservative protocol: NO shoulder AROM/PROM. AROM elbow, wrist, hand OK.  Shoulder Interventions: Shoulder sling/immobilizer;At all times Precaution Booklet Issued: Yes (comment) Precaution Comments: Reviewed precautions with pt. Required Braces or Orthoses: Sling Restrictions Weight Bearing Restrictions: Yes RUE Weight Bearing: Weight bearing as tolerated      Mobility  Bed Mobility Overal bed mobility: Needs Assistance Bed Mobility: Rolling;Sidelying to Sit Rolling: Supervision Sidelying to sit: Supervision       General bed mobility comments: Able  to sit up by herself with effort but she was able.    Transfers Overall transfer level: Needs assistance Equipment used: 1 person hand held assist Transfers: Sit to/from [employer] Sit to Stand: Min guard Stand pivot transfers: Min guard       General transfer comment: Pt was able to stand and pivot to 3N1 and then to recliner with steadying assist only.  Used 1 person HHA for stablity due to didnt' have hemiwalker.  Ambulation/Gait                Stairs            Wheelchair Mobility    Modified Rankin (Stroke Patients Only)       Balance Overall balance assessment: Needs assistance Sitting-balance support: No upper extremity supported;Feet supported Sitting balance-Leahy Scale: Fair     Standing balance support: Single extremity supported;During functional activity Standing balance-Leahy Scale: Poor Standing balance comment: relies on 1 UE support for stability.                              Pertinent Vitals/Pain Pain Assessment: No/denies pain  VSS    Home Living Family/patient expects to be discharged to:: Private residence Living Arrangements: Spouse/significant other;Children Available Help at Discharge: Family;Available 24 hours/day Type of Home: Apartment Home Access: Level entry     Home Layout: One level Home Equipment: Walker - 2 wheels;Cane - single point;Shower seat;Grab bars - tub/shower;Hand held shower head;Wheelchair - manual;Other (comment) (hemi walker)      Prior Function Level of Independence: Needs assistance   Gait /  Transfers Assistance Needed: Using hemi walker for functional mobility in home and in community.  Per pt report, she does not walk far, but what walking she does in unassisted (i.e. family doesn't have to help her).   ADL's / Homemaking Assistance Needed: Has a home health aide that assists with bathing and dressing. 3x per week for 2-3 hours.   Comments: used tub transfer bench for  showering     Hand Dominance   Dominant Hand: Left    Extremity/Trunk Assessment   Upper Extremity Assessment: Defer to OT evaluation           Lower Extremity Assessment: Generalized weakness      Cervical / Trunk Assessment: Kyphotic;Other exceptions (reports neck pain in sitting)  Communication   Communication: No difficulties  Cognition Arousal/Alertness: Awake/alert Behavior During Therapy: WFL for tasks assessed/performed Overall Cognitive Status: Impaired/Different from baseline Area of Impairment: Safety/judgement;Awareness;Problem solving         Safety/Judgement: Decreased awareness of safety;Decreased awareness of deficits Awareness: Intellectual Problem Solving: Decreased initiation;Difficulty sequencing;Requires verbal cues      General Comments General comments (skin integrity, edema, etc.): Pt states that she can go home if husband can continue assist.  Husband and daughter are disabled and they have a caregiver Lavone Orn that drives them around.  Told Henrietta, CM of pts home situation and she called and husband arranging transport for 130 to pick pt up today.      Exercises        Assessment/Plan    PT Assessment Patient needs continued PT services  PT Diagnosis Difficulty walking;Abnormality of gait;Generalized weakness;Acute pain   PT Problem List Decreased strength;Decreased range of motion;Decreased activity tolerance;Decreased balance;Decreased mobility;Decreased knowledge of use of DME;Pain  PT Treatment Interventions DME instruction;Gait training;Functional mobility training;Therapeutic activities;Therapeutic exercise;Balance training;Patient/family education;Neuromuscular re-education;Modalities   PT Goals (Current goals can be found in the Care Plan section) Acute Rehab PT Goals Patient Stated Goal: pt and family would like to go home at discharge PT Goal Formulation: With patient Time For Goal Achievement: 10/24/15 Potential to  Achieve Goals: Good    Frequency Min 3X/week   Barriers to discharge        Co-evaluation               End of Session Equipment Utilized During Treatment: Other (comment) (sling right arm) Activity Tolerance: Patient limited by fatigue Patient left: in chair;with call bell/phone within reach;with chair alarm set Nurse Communication: Mobility status         Time: 1610-9604 PT Time Calculation (min) (ACUTE ONLY): 23 min   Charges:   PT Evaluation $PT Eval Moderate Complexity: 1 Procedure PT Treatments $Therapeutic Activity: 8-22 mins   PT G CodesTawni Millers F October 30, 2015, 12:16 PM [name],PT Acute Rehabilitation [phone] (pager)

## 2015-10-17 NOTE — Progress Notes (Signed)
Discharge instructions given and patient verbalized understanding. Patient will be wheeled down to main entrance for pick up by next of kin.

## 2015-10-17 NOTE — Care Management Note (Signed)
Case Management Note  Patient Details  Name: [name] MRN: [id number] Date of Birth: 02/23/1961  Subjective/Objective:    Pt was discharged on 10/14/15 with 3-N-1, has walker and wheelchair.  Pt's payor is Medicaid so did not qualify for therapy.  TC to significant other, [name], who states he is pt's caregiver, pt also has medicaid PCS 3 x wk, 2-3 hrs/day.  [name] has arranged transportation for pt who is being discharged today.                     Expected Discharge Plan:  Home/Self Care  Discharge planning Services  CM Consult  Status of Service:  Completed, signed off  [place], [place] 10/17/2015, 12:23 PM

## 2015-10-23 ENCOUNTER — Encounter (HOSPITAL_COMMUNITY): Payer: Self-pay | Admitting: Orthopedic Surgery

## 2015-10-24 ENCOUNTER — Ambulatory Visit: Payer: Medicaid Other | Admitting: Podiatry

## 2015-11-09 ENCOUNTER — Other Ambulatory Visit: Payer: Self-pay | Admitting: Cardiology

## 2015-11-09 MED ORDER — FUROSEMIDE 40 MG PO TABS: 40.0000 mg | ORAL_TABLET | Freq: Every day | ORAL | Status: DC

## 2015-11-10 ENCOUNTER — Ambulatory Visit: Payer: Medicaid Other | Admitting: Podiatry

## 2015-11-24 ENCOUNTER — Ambulatory Visit (INDEPENDENT_AMBULATORY_CARE_PROVIDER_SITE_OTHER): Payer: Medicaid Other | Admitting: Podiatry

## 2015-11-24 ENCOUNTER — Encounter: Payer: Self-pay | Admitting: Podiatry

## 2015-11-24 VITALS — Resp 16

## 2015-11-24 DIAGNOSIS — B351 Tinea unguium: Secondary | ICD-10-CM | POA: Diagnosis not present

## 2015-11-24 DIAGNOSIS — E1140 Type 2 diabetes mellitus with diabetic neuropathy, unspecified: Secondary | ICD-10-CM

## 2015-11-24 DIAGNOSIS — Q828 Other specified congenital malformations of skin: Secondary | ICD-10-CM

## 2015-11-24 DIAGNOSIS — M79676 Pain in unspecified toe(s): Secondary | ICD-10-CM | POA: Diagnosis not present

## 2015-11-24 NOTE — Progress Notes (Signed)
Patient ID: [name], female   DOB: 1961/06/02, 55 y.o.   MRN: [id number] Complaint:  Visit Type: Patient returns to my office for continued preventative foot care services. Complaint: Patient states" my nails have grown long and thick and become painful to walk and wear shoes" Patient has been diagnosed with DM with no foot complications.  She says she has one painful callus on the outside ball of her left foot. The patient presents for preventative foot care services. No changes to ROS  Podiatric Exam: Vascular: dorsalis pedis and posterior tibial pulses are palpable bilateral. Capillary return is immediate. Temperature gradient is WNL. Skin turgor WNL  Sensorium: Normal Semmes Weinstein monofilament test. Normal tactile sensation bilaterally. Nail Exam: Pt has thick disfigured discolored nails with subungual debris noted bilateral entire nail hallux through fifth toenails Ulcer Exam: There is no evidence of ulcer or pre-ulcerative changes or infection. Orthopedic Exam: Muscle tone and strength are WNL. No limitations in general ROM. No crepitus or effusions noted. Foot type and digits show no abnormalities. Bony prominences are unremarkable. Skin:  Porokeratosis sub 5th metatarsal left foot.. No infection or ulcers  Diagnosis:  Onychomycosis, , Pain in right toe, pain in left toes, Porokeratosis left foot.  Treatment & Plan Procedures and Treatment: Consent by patient was obtained for treatment procedures. The patient understood the discussion of treatment and procedures well. All questions were answered thoroughly reviewed. Debridement of mycotic and hypertrophic toenails, 1 through 5 bilateral and clearing of subungual debris. No ulceration, no infection noted. Debride porokeratosis Return Visit-Office Procedure: Patient instructed to return to the office for a follow up visit 3 months for continued evaluation and treatment.    Helane GuntherGregory Adan Beal DPM
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
## 2015-12-06 ENCOUNTER — Other Ambulatory Visit: Payer: Self-pay

## 2015-12-06 ENCOUNTER — Emergency Department (HOSPITAL_COMMUNITY)
Admission: EM | Admit: 2015-12-06 | Discharge: 2015-12-06 | Discharge status: Absent without leave | Payer: Medicaid Other

## 2015-12-06 MED ORDER — POTASSIUM CHLORIDE CRYS ER 20 MEQ PO TBCR: 20.0000 meq | EXTENDED_RELEASE_TABLET | Freq: Every day | ORAL | Status: DC

## 2015-12-11 ENCOUNTER — Ambulatory Visit: Payer: Medicaid Other | Attending: Internal Medicine | Admitting: Physical Therapy

## 2015-12-11 DIAGNOSIS — M25611 Stiffness of right shoulder, not elsewhere classified: Secondary | ICD-10-CM

## 2015-12-11 DIAGNOSIS — M25511 Pain in right shoulder: Secondary | ICD-10-CM | POA: Diagnosis not present

## 2015-12-11 DIAGNOSIS — R29898 Other symptoms and signs involving the musculoskeletal system: Secondary | ICD-10-CM

## 2015-12-11 DIAGNOSIS — R6889 Other general symptoms and signs: Secondary | ICD-10-CM | POA: Diagnosis present

## 2015-12-11 DIAGNOSIS — R293 Abnormal posture: Secondary | ICD-10-CM | POA: Diagnosis present
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
## 2015-12-22 ENCOUNTER — Inpatient Hospital Stay (HOSPITAL_COMMUNITY)
Admission: EM | Admit: 2015-12-22 | Discharge: 2016-01-09 | DRG: 291 | Disposition: A | Discharge status: Rehabilitation Facility | Payer: Medicaid Other | Attending: Internal Medicine | Admitting: Internal Medicine

## 2015-12-22 ENCOUNTER — Emergency Department (HOSPITAL_COMMUNITY): Payer: Medicaid Other

## 2015-12-22 ENCOUNTER — Encounter (HOSPITAL_COMMUNITY): Payer: Self-pay

## 2015-12-22 DIAGNOSIS — T380X5A Adverse effect of glucocorticoids and synthetic analogues, initial encounter: Secondary | ICD-10-CM | POA: Diagnosis present

## 2015-12-22 DIAGNOSIS — R0600 Dyspnea, unspecified: Secondary | ICD-10-CM

## 2015-12-22 DIAGNOSIS — E1122 Type 2 diabetes mellitus with diabetic chronic kidney disease: Secondary | ICD-10-CM | POA: Diagnosis present

## 2015-12-22 DIAGNOSIS — J690 Pneumonitis due to inhalation of food and vomit: Secondary | ICD-10-CM | POA: Diagnosis not present

## 2015-12-22 DIAGNOSIS — Z452 Encounter for adjustment and management of vascular access device: Secondary | ICD-10-CM

## 2015-12-22 DIAGNOSIS — G9341 Metabolic encephalopathy: Secondary | ICD-10-CM | POA: Diagnosis not present

## 2015-12-22 DIAGNOSIS — Z87891 Personal history of nicotine dependence: Secondary | ICD-10-CM

## 2015-12-22 DIAGNOSIS — I509 Heart failure, unspecified: Secondary | ICD-10-CM | POA: Insufficient documentation

## 2015-12-22 DIAGNOSIS — F0390 Unspecified dementia without behavioral disturbance: Secondary | ICD-10-CM | POA: Diagnosis present

## 2015-12-22 DIAGNOSIS — E1142 Type 2 diabetes mellitus with diabetic polyneuropathy: Secondary | ICD-10-CM | POA: Diagnosis present

## 2015-12-22 DIAGNOSIS — L299 Pruritus, unspecified: Secondary | ICD-10-CM | POA: Diagnosis present

## 2015-12-22 DIAGNOSIS — Z6841 Body Mass Index (BMI) 40.0 and over, adult: Secondary | ICD-10-CM

## 2015-12-22 DIAGNOSIS — IMO0001 Reserved for inherently not codable concepts without codable children: Secondary | ICD-10-CM | POA: Diagnosis present

## 2015-12-22 DIAGNOSIS — G9340 Encephalopathy, unspecified: Secondary | ICD-10-CM

## 2015-12-22 DIAGNOSIS — Z9289 Personal history of other medical treatment: Secondary | ICD-10-CM

## 2015-12-22 DIAGNOSIS — E039 Hypothyroidism, unspecified: Secondary | ICD-10-CM | POA: Diagnosis present

## 2015-12-22 DIAGNOSIS — F32A Depression, unspecified: Secondary | ICD-10-CM | POA: Diagnosis present

## 2015-12-22 DIAGNOSIS — F4001 Agoraphobia with panic disorder: Secondary | ICD-10-CM | POA: Diagnosis present

## 2015-12-22 DIAGNOSIS — I952 Hypotension due to drugs: Secondary | ICD-10-CM | POA: Insufficient documentation

## 2015-12-22 DIAGNOSIS — G8929 Other chronic pain: Secondary | ICD-10-CM | POA: Diagnosis present

## 2015-12-22 DIAGNOSIS — Z7982 Long term (current) use of aspirin: Secondary | ICD-10-CM

## 2015-12-22 DIAGNOSIS — Z9119 Patient's noncompliance with other medical treatment and regimen: Secondary | ICD-10-CM

## 2015-12-22 DIAGNOSIS — J189 Pneumonia, unspecified organism: Secondary | ICD-10-CM | POA: Insufficient documentation

## 2015-12-22 DIAGNOSIS — F339 Major depressive disorder, recurrent, unspecified: Secondary | ICD-10-CM | POA: Diagnosis present

## 2015-12-22 DIAGNOSIS — R062 Wheezing: Secondary | ICD-10-CM

## 2015-12-22 DIAGNOSIS — IMO0002 Reserved for concepts with insufficient information to code with codable children: Secondary | ICD-10-CM | POA: Diagnosis present

## 2015-12-22 DIAGNOSIS — E875 Hyperkalemia: Secondary | ICD-10-CM | POA: Diagnosis not present

## 2015-12-22 DIAGNOSIS — F4000 Agoraphobia, unspecified: Secondary | ICD-10-CM | POA: Diagnosis present

## 2015-12-22 DIAGNOSIS — K219 Gastro-esophageal reflux disease without esophagitis: Secondary | ICD-10-CM | POA: Diagnosis present

## 2015-12-22 DIAGNOSIS — R5381 Other malaise: Secondary | ICD-10-CM | POA: Diagnosis not present

## 2015-12-22 DIAGNOSIS — Z801 Family history of malignant neoplasm of trachea, bronchus and lung: Secondary | ICD-10-CM

## 2015-12-22 DIAGNOSIS — J441 Chronic obstructive pulmonary disease with (acute) exacerbation: Secondary | ICD-10-CM | POA: Diagnosis present

## 2015-12-22 DIAGNOSIS — I050 Rheumatic mitral stenosis: Secondary | ICD-10-CM | POA: Diagnosis present

## 2015-12-22 DIAGNOSIS — N183 Chronic kidney disease, stage 3 (moderate): Secondary | ICD-10-CM | POA: Diagnosis present

## 2015-12-22 DIAGNOSIS — E876 Hypokalemia: Secondary | ICD-10-CM | POA: Diagnosis not present

## 2015-12-22 DIAGNOSIS — Z794 Long term (current) use of insulin: Secondary | ICD-10-CM

## 2015-12-22 DIAGNOSIS — Z9114 Patient's other noncompliance with medication regimen: Secondary | ICD-10-CM

## 2015-12-22 DIAGNOSIS — E669 Obesity, unspecified: Secondary | ICD-10-CM | POA: Diagnosis present

## 2015-12-22 DIAGNOSIS — E11649 Type 2 diabetes mellitus with hypoglycemia without coma: Secondary | ICD-10-CM | POA: Diagnosis not present

## 2015-12-22 DIAGNOSIS — N179 Acute kidney failure, unspecified: Secondary | ICD-10-CM | POA: Diagnosis not present

## 2015-12-22 DIAGNOSIS — J9690 Respiratory failure, unspecified, unspecified whether with hypoxia or hypercapnia: Secondary | ICD-10-CM

## 2015-12-22 DIAGNOSIS — D638 Anemia in other chronic diseases classified elsewhere: Secondary | ICD-10-CM | POA: Diagnosis present

## 2015-12-22 DIAGNOSIS — J9620 Acute and chronic respiratory failure, unspecified whether with hypoxia or hypercapnia: Secondary | ICD-10-CM | POA: Diagnosis present

## 2015-12-22 DIAGNOSIS — I5033 Acute on chronic diastolic (congestive) heart failure: Secondary | ICD-10-CM | POA: Diagnosis present

## 2015-12-22 DIAGNOSIS — I252 Old myocardial infarction: Secondary | ICD-10-CM

## 2015-12-22 DIAGNOSIS — F329 Major depressive disorder, single episode, unspecified: Secondary | ICD-10-CM | POA: Diagnosis present

## 2015-12-22 DIAGNOSIS — E870 Hyperosmolality and hypernatremia: Secondary | ICD-10-CM | POA: Diagnosis not present

## 2015-12-22 DIAGNOSIS — I959 Hypotension, unspecified: Secondary | ICD-10-CM | POA: Diagnosis present

## 2015-12-22 DIAGNOSIS — E785 Hyperlipidemia, unspecified: Secondary | ICD-10-CM | POA: Diagnosis present

## 2015-12-22 DIAGNOSIS — I130 Hypertensive heart and chronic kidney disease with heart failure and stage 1 through stage 4 chronic kidney disease, or unspecified chronic kidney disease: Principal | ICD-10-CM | POA: Diagnosis present

## 2015-12-22 DIAGNOSIS — M549 Dorsalgia, unspecified: Secondary | ICD-10-CM | POA: Diagnosis present

## 2015-12-22 DIAGNOSIS — J9601 Acute respiratory failure with hypoxia: Secondary | ICD-10-CM | POA: Insufficient documentation

## 2015-12-22 DIAGNOSIS — Z823 Family history of stroke: Secondary | ICD-10-CM

## 2015-12-22 DIAGNOSIS — E873 Alkalosis: Secondary | ICD-10-CM | POA: Diagnosis not present

## 2015-12-22 DIAGNOSIS — J449 Chronic obstructive pulmonary disease, unspecified: Secondary | ICD-10-CM | POA: Diagnosis present

## 2015-12-22 DIAGNOSIS — L03116 Cellulitis of left lower limb: Secondary | ICD-10-CM | POA: Diagnosis present

## 2015-12-22 DIAGNOSIS — E1129 Type 2 diabetes mellitus with other diabetic kidney complication: Secondary | ICD-10-CM | POA: Diagnosis present

## 2015-12-22 DIAGNOSIS — E7800 Pure hypercholesterolemia, unspecified: Secondary | ICD-10-CM | POA: Diagnosis present

## 2015-12-22 DIAGNOSIS — F319 Bipolar disorder, unspecified: Secondary | ICD-10-CM | POA: Diagnosis present

## 2015-12-22 DIAGNOSIS — R600 Localized edema: Secondary | ICD-10-CM

## 2015-12-22 DIAGNOSIS — J80 Acute respiratory distress syndrome: Secondary | ICD-10-CM

## 2015-12-22 DIAGNOSIS — I10 Essential (primary) hypertension: Secondary | ICD-10-CM | POA: Diagnosis present

## 2015-12-22 DIAGNOSIS — J9610 Chronic respiratory failure, unspecified whether with hypoxia or hypercapnia: Secondary | ICD-10-CM | POA: Diagnosis present

## 2015-12-22 DIAGNOSIS — Z79899 Other long term (current) drug therapy: Secondary | ICD-10-CM

## 2015-12-22 DIAGNOSIS — Z4659 Encounter for fitting and adjustment of other gastrointestinal appliance and device: Secondary | ICD-10-CM

## 2015-12-22 DIAGNOSIS — E1165 Type 2 diabetes mellitus with hyperglycemia: Secondary | ICD-10-CM | POA: Diagnosis present

## 2015-12-22 DIAGNOSIS — Z8249 Family history of ischemic heart disease and other diseases of the circulatory system: Secondary | ICD-10-CM

## 2015-12-22 HISTORY — DX: Reserved for inherently not codable concepts without codable children: IMO0001

## 2015-12-22 HISTORY — DX: Fracture of right shoulder girdle, part unspecified, initial encounter for closed fracture: S42.91XA

## 2015-12-22 HISTORY — DX: Unspecified fracture of left patella, initial encounter for closed fracture: S82.002A

## 2015-12-22 HISTORY — DX: Unspecified fracture of shaft of humerus, right arm, initial encounter for closed fracture: S42.301A

## 2015-12-22 LAB — URINALYSIS, ROUTINE W REFLEX MICROSCOPIC
Bilirubin Urine: NEGATIVE
Glucose, UA: NEGATIVE mg/dL
Hgb urine dipstick: NEGATIVE
KETONES UR: NEGATIVE mg/dL
NITRITE: NEGATIVE
PH: 5 (ref 5.0–8.0)
Protein, ur: NEGATIVE mg/dL
SPECIFIC GRAVITY, URINE: 1.014 (ref 1.005–1.030)

## 2015-12-22 LAB — COMPREHENSIVE METABOLIC PANEL
ALK PHOS: 83 U/L (ref 38–126)
ALT: 13 U/L — AB (ref 14–54)
AST: 22 U/L (ref 15–41)
Albumin: 3.6 g/dL (ref 3.5–5.0)
Anion gap: 12 (ref 5–15)
BUN: 28 mg/dL — ABNORMAL HIGH (ref 6–20)
CALCIUM: 9.1 mg/dL (ref 8.9–10.3)
CO2: 27 mmol/L (ref 22–32)
CREATININE: 1.27 mg/dL — AB (ref 0.44–1.00)
Chloride: 100 mmol/L — ABNORMAL LOW (ref 101–111)
GFR calc Af Amer: 54 mL/min — ABNORMAL LOW (ref >60)
GFR, EST NON AFRICAN AMERICAN: 46 mL/min — AB (ref >60)
Glucose, Bld: 105 mg/dL — ABNORMAL HIGH (ref 65–99)
Potassium: 4 mmol/L (ref 3.5–5.1)
Sodium: 139 mmol/L (ref 135–145)
TOTAL PROTEIN: 6.6 g/dL (ref 6.5–8.1)
Total Bilirubin: 0.9 mg/dL (ref 0.3–1.2)

## 2015-12-22 LAB — CBC WITH DIFFERENTIAL/PLATELET
Basophils Absolute: 0 10*3/uL (ref 0.0–0.1)
Basophils Relative: 0 %
EOS PCT: 2 %
Eosinophils Absolute: 0.2 10*3/uL (ref 0.0–0.7)
HCT: 30.7 % — ABNORMAL LOW (ref 36.0–46.0)
HEMOGLOBIN: 9 g/dL — AB (ref 12.0–15.0)
LYMPHS ABS: 1.3 10*3/uL (ref 0.7–4.0)
LYMPHS PCT: 14 %
MCH: 23.8 pg — AB (ref 26.0–34.0)
MCHC: 29.3 g/dL — AB (ref 30.0–36.0)
MCV: 81.2 fL (ref 78.0–100.0)
MONOS PCT: 5 %
Monocytes Absolute: 0.5 10*3/uL (ref 0.1–1.0)
Neutro Abs: 7.4 10*3/uL (ref 1.7–7.7)
Neutrophils Relative %: 79 %
Platelets: 467 10*3/uL — ABNORMAL HIGH (ref 150–400)
RBC: 3.78 MIL/uL — AB (ref 3.87–5.11)
RDW: 16.3 % — ABNORMAL HIGH (ref 11.5–15.5)
WBC: 9.4 10*3/uL (ref 4.0–10.5)

## 2015-12-22 LAB — URINE MICROSCOPIC-ADD ON: RBC / HPF: NONE SEEN RBC/hpf (ref 0–5)

## 2015-12-22 MED ORDER — LORAZEPAM 2 MG/ML IJ SOLN
1.0000 mg | Freq: Once | INTRAMUSCULAR | Status: AC
  Administered 2015-12-22: 1 mg via INTRAVENOUS
  Filled 2015-12-22: qty 1

## 2015-12-22 MED ORDER — FUROSEMIDE 10 MG/ML IJ SOLN: 40.0000 mg | Freq: Once | INTRAMUSCULAR | Status: DC

## 2015-12-22 MED ORDER — FENTANYL CITRATE (PF) 100 MCG/2ML IJ SOLN
50.0000 ug | Freq: Once | INTRAMUSCULAR | Status: AC
  Administered 2015-12-22: 50 ug via INTRAVENOUS
  Filled 2015-12-22: qty 2

## 2015-12-22 MED ORDER — POTASSIUM CHLORIDE CRYS ER 20 MEQ PO TBCR: 20.0000 meq | EXTENDED_RELEASE_TABLET | Freq: Once | ORAL | Status: DC

## 2015-12-22 MED ORDER — IPRATROPIUM-ALBUTEROL 0.5-2.5 (3) MG/3ML IN SOLN
3.0000 mL | Freq: Once | RESPIRATORY_TRACT | Status: DC
  Filled 2015-12-22: qty 3

## 2015-12-22 NOTE — ED Notes (Signed)
Per EMS: Pt complaining of bilateral leg pain and SOB. Hx: COPD, wears 3L O2 at home. Pt a/o x 4. Has wounds on both legs.
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
## 2015-12-23 ENCOUNTER — Encounter (HOSPITAL_COMMUNITY): Payer: Self-pay | Admitting: Family Medicine

## 2015-12-23 DIAGNOSIS — E870 Hyperosmolality and hypernatremia: Secondary | ICD-10-CM | POA: Diagnosis not present

## 2015-12-23 DIAGNOSIS — F319 Bipolar disorder, unspecified: Secondary | ICD-10-CM | POA: Diagnosis present

## 2015-12-23 DIAGNOSIS — R600 Localized edema: Secondary | ICD-10-CM | POA: Diagnosis present

## 2015-12-23 DIAGNOSIS — I252 Old myocardial infarction: Secondary | ICD-10-CM | POA: Diagnosis not present

## 2015-12-23 DIAGNOSIS — E11649 Type 2 diabetes mellitus with hypoglycemia without coma: Secondary | ICD-10-CM | POA: Diagnosis not present

## 2015-12-23 DIAGNOSIS — Z6841 Body Mass Index (BMI) 40.0 and over, adult: Secondary | ICD-10-CM | POA: Diagnosis not present

## 2015-12-23 DIAGNOSIS — Z794 Long term (current) use of insulin: Secondary | ICD-10-CM | POA: Diagnosis not present

## 2015-12-23 DIAGNOSIS — E1165 Type 2 diabetes mellitus with hyperglycemia: Secondary | ICD-10-CM

## 2015-12-23 DIAGNOSIS — I050 Rheumatic mitral stenosis: Secondary | ICD-10-CM | POA: Diagnosis present

## 2015-12-23 DIAGNOSIS — E785 Hyperlipidemia, unspecified: Secondary | ICD-10-CM | POA: Diagnosis present

## 2015-12-23 DIAGNOSIS — Z801 Family history of malignant neoplasm of trachea, bronchus and lung: Secondary | ICD-10-CM | POA: Diagnosis not present

## 2015-12-23 DIAGNOSIS — L299 Pruritus, unspecified: Secondary | ICD-10-CM | POA: Diagnosis present

## 2015-12-23 DIAGNOSIS — J9600 Acute respiratory failure, unspecified whether with hypoxia or hypercapnia: Secondary | ICD-10-CM | POA: Diagnosis not present

## 2015-12-23 DIAGNOSIS — J9621 Acute and chronic respiratory failure with hypoxia: Secondary | ICD-10-CM | POA: Diagnosis not present

## 2015-12-23 DIAGNOSIS — G9341 Metabolic encephalopathy: Secondary | ICD-10-CM | POA: Diagnosis not present

## 2015-12-23 DIAGNOSIS — G8929 Other chronic pain: Secondary | ICD-10-CM | POA: Diagnosis present

## 2015-12-23 DIAGNOSIS — E038 Other specified hypothyroidism: Secondary | ICD-10-CM

## 2015-12-23 DIAGNOSIS — G9340 Encephalopathy, unspecified: Secondary | ICD-10-CM | POA: Diagnosis not present

## 2015-12-23 DIAGNOSIS — J438 Other emphysema: Secondary | ICD-10-CM

## 2015-12-23 DIAGNOSIS — Z9119 Patient's noncompliance with other medical treatment and regimen: Secondary | ICD-10-CM | POA: Diagnosis not present

## 2015-12-23 DIAGNOSIS — L03116 Cellulitis of left lower limb: Secondary | ICD-10-CM | POA: Diagnosis present

## 2015-12-23 DIAGNOSIS — E1122 Type 2 diabetes mellitus with diabetic chronic kidney disease: Secondary | ICD-10-CM

## 2015-12-23 DIAGNOSIS — I5033 Acute on chronic diastolic (congestive) heart failure: Secondary | ICD-10-CM | POA: Diagnosis present

## 2015-12-23 DIAGNOSIS — J810 Acute pulmonary edema: Secondary | ICD-10-CM | POA: Diagnosis not present

## 2015-12-23 DIAGNOSIS — N179 Acute kidney failure, unspecified: Secondary | ICD-10-CM | POA: Diagnosis not present

## 2015-12-23 DIAGNOSIS — F0390 Unspecified dementia without behavioral disturbance: Secondary | ICD-10-CM | POA: Diagnosis present

## 2015-12-23 DIAGNOSIS — F339 Major depressive disorder, recurrent, unspecified: Secondary | ICD-10-CM | POA: Diagnosis present

## 2015-12-23 DIAGNOSIS — F4001 Agoraphobia with panic disorder: Secondary | ICD-10-CM | POA: Diagnosis present

## 2015-12-23 DIAGNOSIS — E039 Hypothyroidism, unspecified: Secondary | ICD-10-CM | POA: Diagnosis present

## 2015-12-23 DIAGNOSIS — J9620 Acute and chronic respiratory failure, unspecified whether with hypoxia or hypercapnia: Secondary | ICD-10-CM | POA: Diagnosis not present

## 2015-12-23 DIAGNOSIS — Z823 Family history of stroke: Secondary | ICD-10-CM | POA: Diagnosis not present

## 2015-12-23 DIAGNOSIS — J9692 Respiratory failure, unspecified with hypercapnia: Secondary | ICD-10-CM | POA: Diagnosis not present

## 2015-12-23 DIAGNOSIS — J189 Pneumonia, unspecified organism: Secondary | ICD-10-CM | POA: Diagnosis not present

## 2015-12-23 DIAGNOSIS — I130 Hypertensive heart and chronic kidney disease with heart failure and stage 1 through stage 4 chronic kidney disease, or unspecified chronic kidney disease: Secondary | ICD-10-CM | POA: Diagnosis present

## 2015-12-23 DIAGNOSIS — E1065 Type 1 diabetes mellitus with hyperglycemia: Secondary | ICD-10-CM

## 2015-12-23 DIAGNOSIS — M549 Dorsalgia, unspecified: Secondary | ICD-10-CM | POA: Diagnosis present

## 2015-12-23 DIAGNOSIS — F4000 Agoraphobia, unspecified: Secondary | ICD-10-CM | POA: Diagnosis present

## 2015-12-23 DIAGNOSIS — F329 Major depressive disorder, single episode, unspecified: Secondary | ICD-10-CM

## 2015-12-23 DIAGNOSIS — I10 Essential (primary) hypertension: Secondary | ICD-10-CM

## 2015-12-23 DIAGNOSIS — D638 Anemia in other chronic diseases classified elsewhere: Secondary | ICD-10-CM | POA: Diagnosis present

## 2015-12-23 DIAGNOSIS — N183 Chronic kidney disease, stage 3 (moderate): Secondary | ICD-10-CM | POA: Diagnosis present

## 2015-12-23 DIAGNOSIS — E7800 Pure hypercholesterolemia, unspecified: Secondary | ICD-10-CM | POA: Diagnosis present

## 2015-12-23 DIAGNOSIS — E876 Hypokalemia: Secondary | ICD-10-CM | POA: Diagnosis not present

## 2015-12-23 DIAGNOSIS — J690 Pneumonitis due to inhalation of food and vomit: Secondary | ICD-10-CM | POA: Diagnosis not present

## 2015-12-23 DIAGNOSIS — J441 Chronic obstructive pulmonary disease with (acute) exacerbation: Secondary | ICD-10-CM | POA: Diagnosis not present

## 2015-12-23 DIAGNOSIS — E1142 Type 2 diabetes mellitus with diabetic polyneuropathy: Secondary | ICD-10-CM | POA: Diagnosis present

## 2015-12-23 DIAGNOSIS — F3170 Bipolar disorder, currently in remission, most recent episode unspecified: Secondary | ICD-10-CM | POA: Diagnosis not present

## 2015-12-23 DIAGNOSIS — J9622 Acute and chronic respiratory failure with hypercapnia: Secondary | ICD-10-CM | POA: Diagnosis not present

## 2015-12-23 DIAGNOSIS — J449 Chronic obstructive pulmonary disease, unspecified: Secondary | ICD-10-CM | POA: Diagnosis not present

## 2015-12-23 DIAGNOSIS — F411 Generalized anxiety disorder: Secondary | ICD-10-CM | POA: Diagnosis not present

## 2015-12-23 DIAGNOSIS — Z9114 Patient's other noncompliance with medication regimen: Secondary | ICD-10-CM | POA: Diagnosis not present

## 2015-12-23 DIAGNOSIS — E669 Obesity, unspecified: Secondary | ICD-10-CM | POA: Diagnosis present

## 2015-12-23 DIAGNOSIS — T380X5A Adverse effect of glucocorticoids and synthetic analogues, initial encounter: Secondary | ICD-10-CM | POA: Diagnosis present

## 2015-12-23 DIAGNOSIS — J9601 Acute respiratory failure with hypoxia: Secondary | ICD-10-CM | POA: Diagnosis not present

## 2015-12-23 DIAGNOSIS — Z79899 Other long term (current) drug therapy: Secondary | ICD-10-CM | POA: Diagnosis not present

## 2015-12-23 DIAGNOSIS — J80 Acute respiratory distress syndrome: Secondary | ICD-10-CM | POA: Diagnosis not present

## 2015-12-23 DIAGNOSIS — N182 Chronic kidney disease, stage 2 (mild): Secondary | ICD-10-CM

## 2015-12-23 DIAGNOSIS — E875 Hyperkalemia: Secondary | ICD-10-CM | POA: Diagnosis not present

## 2015-12-23 DIAGNOSIS — Z7982 Long term (current) use of aspirin: Secondary | ICD-10-CM | POA: Diagnosis not present

## 2015-12-23 DIAGNOSIS — Z8249 Family history of ischemic heart disease and other diseases of the circulatory system: Secondary | ICD-10-CM | POA: Diagnosis not present

## 2015-12-23 DIAGNOSIS — I959 Hypotension, unspecified: Secondary | ICD-10-CM | POA: Diagnosis present

## 2015-12-23 DIAGNOSIS — E1043 Type 1 diabetes mellitus with diabetic autonomic (poly)neuropathy: Secondary | ICD-10-CM

## 2015-12-23 DIAGNOSIS — I509 Heart failure, unspecified: Secondary | ICD-10-CM | POA: Diagnosis not present

## 2015-12-23 DIAGNOSIS — K219 Gastro-esophageal reflux disease without esophagitis: Secondary | ICD-10-CM | POA: Diagnosis present

## 2015-12-23 DIAGNOSIS — E873 Alkalosis: Secondary | ICD-10-CM | POA: Diagnosis not present

## 2015-12-23 DIAGNOSIS — R5381 Other malaise: Secondary | ICD-10-CM | POA: Diagnosis not present

## 2015-12-23 DIAGNOSIS — Z87891 Personal history of nicotine dependence: Secondary | ICD-10-CM | POA: Diagnosis not present

## 2015-12-23 LAB — CBC
HEMATOCRIT: 30 % — AB (ref 36.0–46.0)
Hemoglobin: 8.8 g/dL — ABNORMAL LOW (ref 12.0–15.0)
MCH: 23.9 pg — AB (ref 26.0–34.0)
MCHC: 29.3 g/dL — AB (ref 30.0–36.0)
MCV: 81.5 fL (ref 78.0–100.0)
Platelets: 413 10*3/uL — ABNORMAL HIGH (ref 150–400)
RBC: 3.68 MIL/uL — ABNORMAL LOW (ref 3.87–5.11)
RDW: 16.1 % — AB (ref 11.5–15.5)
WBC: 7.6 10*3/uL (ref 4.0–10.5)

## 2015-12-23 LAB — CREATININE, SERUM
Creatinine, Ser: 1.22 mg/dL — ABNORMAL HIGH (ref 0.44–1.00)
GFR calc Af Amer: 56 mL/min — ABNORMAL LOW (ref >60)
GFR, EST NON AFRICAN AMERICAN: 49 mL/min — AB (ref >60)

## 2015-12-23 LAB — GLUCOSE, CAPILLARY
GLUCOSE-CAPILLARY: 58 mg/dL — AB (ref 65–99)
GLUCOSE-CAPILLARY: 80 mg/dL (ref 65–99)
GLUCOSE-CAPILLARY: 158 mg/dL — AB (ref 65–99)
GLUCOSE-CAPILLARY: 137 mg/dL — AB (ref 65–99)
Glucose-Capillary: 58 mg/dL — ABNORMAL LOW (ref 65–99)
Glucose-Capillary: 133 mg/dL — ABNORMAL HIGH (ref 65–99)

## 2015-12-23 LAB — BRAIN NATRIURETIC PEPTIDE: B Natriuretic Peptide: 348.3 pg/mL — ABNORMAL HIGH (ref 0.0–100.0)

## 2015-12-23 LAB — MAGNESIUM: MAGNESIUM: 1.9 mg/dL (ref 1.7–2.4)

## 2015-12-23 LAB — TSH: TSH: 18.432 u[IU]/mL — ABNORMAL HIGH (ref 0.350–4.500)

## 2015-12-23 LAB — I-STAT TROPONIN, ED: Troponin i, poc: 0 ng/mL (ref 0.00–0.08)

## 2015-12-23 MED ORDER — INSULIN GLARGINE 100 UNIT/ML ~~LOC~~ SOLN
50.0000 [IU] | Freq: Every day | SUBCUTANEOUS | Status: DC
  Filled 2015-12-23: qty 0.5

## 2015-12-23 MED ORDER — ARIPIPRAZOLE 5 MG PO TABS
5.0000 mg | ORAL_TABLET | Freq: Every day | ORAL | Status: DC
  Administered 2015-12-23 – 2015-12-25 (×3): 5 mg via ORAL
  Filled 2015-12-23 (×3): qty 1

## 2015-12-23 MED ORDER — INSULIN ASPART 100 UNIT/ML ~~LOC~~ SOLN: 0.0000 [IU] | Freq: Every day | SUBCUTANEOUS | Status: DC

## 2015-12-23 MED ORDER — ALPRAZOLAM 0.5 MG PO TABS
0.5000 mg | ORAL_TABLET | Freq: Two times a day (BID) | ORAL | Status: DC
  Administered 2015-12-23 – 2015-12-25 (×4): 0.5 mg via ORAL
  Filled 2015-12-23 (×4): qty 1

## 2015-12-23 MED ORDER — ALPRAZOLAM 0.5 MG PO TABS: 0.5000 mg | ORAL_TABLET | Freq: Every evening | ORAL | Status: DC | PRN

## 2015-12-23 MED ORDER — POTASSIUM CHLORIDE CRYS ER 20 MEQ PO TBCR
20.0000 meq | EXTENDED_RELEASE_TABLET | Freq: Every day | ORAL | Status: DC
  Administered 2015-12-23 – 2015-12-25 (×3): 20 meq via ORAL
  Filled 2015-12-23 (×3): qty 1

## 2015-12-23 MED ORDER — HYDROCODONE-ACETAMINOPHEN 5-325 MG PO TABS
1.0000 | ORAL_TABLET | Freq: Two times a day (BID) | ORAL | Status: DC | PRN
Start: 2015-12-23 — End: 2015-12-23
  Administered 2015-12-23: 1 via ORAL
  Filled 2015-12-23: qty 1

## 2015-12-23 MED ORDER — FENTANYL CITRATE (PF) 100 MCG/2ML IJ SOLN
50.0000 ug | Freq: Once | INTRAMUSCULAR | Status: AC
  Administered 2015-12-23: 50 ug via INTRAVENOUS
  Filled 2015-12-23: qty 2

## 2015-12-23 MED ORDER — ALBUTEROL SULFATE (2.5 MG/3ML) 0.083% IN NEBU
2.5000 mg | INHALATION_SOLUTION | RESPIRATORY_TRACT | Status: DC | PRN
  Administered 2015-12-24 – 2015-12-25 (×3): 2.5 mg via RESPIRATORY_TRACT
  Filled 2015-12-23 (×4): qty 3

## 2015-12-23 MED ORDER — HYDROCORTISONE VALERATE 0.2 % EX CREA
TOPICAL_CREAM | Freq: Two times a day (BID) | CUTANEOUS | Status: DC
  Administered 2015-12-23 – 2015-12-25 (×5): via TOPICAL
  Filled 2015-12-23: qty 15

## 2015-12-23 MED ORDER — INSULIN GLARGINE 100 UNIT/ML ~~LOC~~ SOLN
60.0000 [IU] | Freq: Every day | SUBCUTANEOUS | Status: DC
Start: 2015-12-23 — End: 2015-12-23
  Filled 2015-12-23: qty 0.6

## 2015-12-23 MED ORDER — ENOXAPARIN SODIUM 40 MG/0.4ML ~~LOC~~ SOLN
40.0000 mg | Freq: Every day | SUBCUTANEOUS | Status: DC
  Administered 2015-12-23 – 2016-01-09 (×18): 40 mg via SUBCUTANEOUS
  Filled 2015-12-23 (×18): qty 0.4

## 2015-12-23 MED ORDER — LEVOTHYROXINE SODIUM 25 MCG PO TABS
125.0000 ug | ORAL_TABLET | Freq: Every day | ORAL | Status: DC
  Administered 2015-12-23 – 2015-12-25 (×3): 125 ug via ORAL
  Filled 2015-12-23 (×3): qty 1

## 2015-12-23 MED ORDER — ASPIRIN EC 81 MG PO TBEC
81.0000 mg | DELAYED_RELEASE_TABLET | Freq: Every day | ORAL | Status: DC
  Administered 2015-12-23 – 2015-12-25 (×3): 81 mg via ORAL
  Filled 2015-12-23 (×3): qty 1

## 2015-12-23 MED ORDER — SALINE SPRAY 0.65 % NA SOLN
1.0000 | NASAL | Status: DC | PRN
  Administered 2015-12-23: 1 via NASAL
  Filled 2015-12-23: qty 44

## 2015-12-23 MED ORDER — ONDANSETRON HCL 4 MG/2ML IJ SOLN
4.0000 mg | Freq: Four times a day (QID) | INTRAMUSCULAR | Status: DC | PRN
  Administered 2016-01-07: 4 mg via INTRAVENOUS
  Filled 2015-12-23: qty 2

## 2015-12-23 MED ORDER — ONDANSETRON HCL 4 MG PO TABS: 4.0000 mg | ORAL_TABLET | Freq: Four times a day (QID) | ORAL | Status: DC | PRN

## 2015-12-23 MED ORDER — SIMVASTATIN 20 MG PO TABS
10.0000 mg | ORAL_TABLET | Freq: Every day | ORAL | Status: DC
  Administered 2015-12-23 – 2015-12-24 (×2): 10 mg via ORAL
  Filled 2015-12-23 (×2): qty 1

## 2015-12-23 MED ORDER — BACITRACIN ZINC 500 UNIT/GM EX OINT
TOPICAL_OINTMENT | Freq: Two times a day (BID) | CUTANEOUS | Status: DC
  Administered 2015-12-23 – 2015-12-25 (×5): 31.5556 via TOPICAL
  Filled 2015-12-23: qty 28.35

## 2015-12-23 MED ORDER — FUROSEMIDE 10 MG/ML IJ SOLN
40.0000 mg | Freq: Once | INTRAMUSCULAR | Status: AC
  Administered 2015-12-23: 40 mg via INTRAVENOUS
  Filled 2015-12-23: qty 4

## 2015-12-23 MED ORDER — DOXYCYCLINE HYCLATE 100 MG PO TABS
100.0000 mg | ORAL_TABLET | Freq: Two times a day (BID) | ORAL | Status: DC
  Administered 2015-12-23 – 2015-12-25 (×5): 100 mg via ORAL
  Filled 2015-12-23 (×5): qty 1

## 2015-12-23 MED ORDER — FUROSEMIDE 10 MG/ML IJ SOLN
40.0000 mg | Freq: Every day | INTRAMUSCULAR | Status: DC
  Administered 2015-12-23 – 2015-12-25 (×3): 40 mg via INTRAVENOUS
  Filled 2015-12-23 (×3): qty 4

## 2015-12-23 MED ORDER — GABAPENTIN 100 MG PO CAPS
100.0000 mg | ORAL_CAPSULE | Freq: Three times a day (TID) | ORAL | Status: DC
  Administered 2015-12-23 – 2015-12-25 (×7): 100 mg via ORAL
  Filled 2015-12-23 (×7): qty 1

## 2015-12-23 MED ORDER — SENNOSIDES-DOCUSATE SODIUM 8.6-50 MG PO TABS: 1.0000 | ORAL_TABLET | Freq: Every evening | ORAL | Status: DC | PRN

## 2015-12-23 MED ORDER — ACETAMINOPHEN 650 MG RE SUPP: 650.0000 mg | Freq: Four times a day (QID) | RECTAL | Status: DC | PRN

## 2015-12-23 MED ORDER — INSULIN ASPART 100 UNIT/ML ~~LOC~~ SOLN
0.0000 [IU] | Freq: Three times a day (TID) | SUBCUTANEOUS | Status: DC
  Administered 2015-12-23: 3 [IU] via SUBCUTANEOUS
  Administered 2015-12-23: 2 [IU] via SUBCUTANEOUS
  Administered 2015-12-24: 8 [IU] via SUBCUTANEOUS
  Administered 2015-12-24: 11 [IU] via SUBCUTANEOUS
  Administered 2015-12-24: 3 [IU] via SUBCUTANEOUS
  Administered 2015-12-25: 11 [IU] via SUBCUTANEOUS
  Administered 2015-12-25: 5 [IU] via SUBCUTANEOUS

## 2015-12-23 MED ORDER — BUPROPION HCL ER (XL) 300 MG PO TB24
300.0000 mg | ORAL_TABLET | Freq: Every day | ORAL | Status: DC
  Administered 2015-12-23: 300 mg via ORAL
  Filled 2015-12-23: qty 1

## 2015-12-23 MED ORDER — HYDROCODONE-ACETAMINOPHEN 5-325 MG PO TABS
2.0000 | ORAL_TABLET | Freq: Four times a day (QID) | ORAL | Status: DC | PRN
  Administered 2015-12-23 – 2015-12-25 (×8): 2 via ORAL
  Filled 2015-12-23 (×9): qty 2

## 2015-12-23 MED ORDER — ALBUTEROL SULFATE (2.5 MG/3ML) 0.083% IN NEBU: 2.5000 mg | INHALATION_SOLUTION | RESPIRATORY_TRACT | Status: AC | PRN

## 2015-12-23 MED ORDER — ACETAMINOPHEN 325 MG PO TABS
650.0000 mg | ORAL_TABLET | Freq: Four times a day (QID) | ORAL | Status: DC | PRN
  Administered 2015-12-25: 650 mg via ORAL
  Filled 2015-12-23: qty 2

## 2015-12-23 NOTE — Evaluation (Signed)
Physical Therapy Evaluation Patient Details Name: [name] MRN: [id number] DOB: 05/01/1961 Today's Date: 12/23/2015   History of Present Illness  Patient is a 55 yo female admitted 12/22/15 with acute on chronic CHF, LE edema, and LE wounds.   PMH:  CHF, mitral stenosis, DM, chronic pain, HTN, neuropathy, anemia, depression, anxiety, MI, tobacco use, COPD, home O2, arthritis    Clinical Impression  Patient presents with problems listed below.  Will benefit from acute PT to maximize functional mobility prior to discharge.  Patient able to tolerate minimal mobility today.  Recommend HHPT if patient progresses to supervision level.  If not, may need to consider SNF.    Follow Up Recommendations Home health PT;Supervision for mobility/OOB (May need to consider SNF if patient does not progress with mobility.)    Equipment Recommendations  None recommended by PT    Recommendations for Other Services       Precautions / Restrictions Precautions Precautions: Fall Restrictions Weight Bearing Restrictions: No      Mobility  Bed Mobility Overal bed mobility: Needs Assistance Bed Mobility: Rolling;Supine to Sit Rolling: Mod assist   Supine to sit: Mod assist     General bed mobility comments: Patient able to initiate rolling with use of UE's and bed rail.  Required mod assist to complete rolling onto side.  Attempted to move supine to sit.  Patient c/o increased pain in LE's and declined further mobility.  Transfers                    Ambulation/Gait                Stairs            Wheelchair Mobility    Modified Rankin (Stroke Patients Only)       Balance                                             Pertinent Vitals/Pain Pain Assessment: Faces Faces Pain Scale: Hurts even more Pain Location: Back, LE's Pain Descriptors / Indicators: Aching;Burning Pain Intervention(s): Limited activity within patient's tolerance;Monitored  during session;Repositioned    Home Living Family/patient expects to be discharged to:: Private residence Living Arrangements: Spouse/significant other Available Help at Discharge: Family;Available 24 hours/day Type of Home: Apartment Home Access: Level entry     Home Layout: One level Home Equipment: Walker - 2 wheels;Cane - single point;Shower seat;Grab bars - tub/shower;Hand held shower head;Wheelchair - Careers advisermanual;Other (comment);Bedside commode (Hemiwalker)      Prior Function Level of Independence: Needs assistance   Gait / Transfers Assistance Needed: Uses hemiwalker in house.  Uses w/c for outside of home.  ADL's / Homemaking Assistance Needed: Has a home health aide that assists with bathing and dressing, laundry, meals 3x per week for 3-4 hours.  Significant other does meal prep, housekeeping, etc.        Hand Dominance   Dominant Hand: Left    Extremity/Trunk Assessment   Upper Extremity Assessment: Generalized weakness;RUE deficits/detail RUE Deficits / Details: Decreased ROM and strength at shoulder and hand         Lower Extremity Assessment: Generalized weakness;RLE deficits/detail;LLE deficits/detail RLE Deficits / Details: Noted increased edema and wounds.  Decreased strength. LLE Deficits / Details: Strength grossly 3/5.  Edema noted along with wounds.     Communication   Communication: No difficulties  Cognition Arousal/Alertness: Awake/alert Behavior During Therapy: WFL for tasks assessed/performed;Anxious (Becomes agitated quickly. ) Overall Cognitive Status: Within Functional Limits for tasks assessed                      General Comments      Exercises        Assessment/Plan    PT Assessment Patient needs continued PT services  PT Diagnosis Difficulty walking;Generalized weakness;Acute pain   PT Problem List Decreased strength;Decreased range of motion;Decreased activity tolerance;Decreased balance;Decreased mobility;Decreased  knowledge of use of DME;Cardiopulmonary status limiting activity;Impaired sensation;Obesity;Pain;Decreased skin integrity  PT Treatment Interventions DME instruction;Gait training;Functional mobility training;Therapeutic activities;Therapeutic exercise;Balance training;Patient/family education   PT Goals (Current goals can be found in the Care Plan section) Acute Rehab PT Goals Patient Stated Goal: None stated PT Goal Formulation: With patient Time For Goal Achievement: 12/30/15 Potential to Achieve Goals: Fair    Frequency Min 3X/week   Barriers to discharge        Co-evaluation               End of Session Equipment Utilized During Treatment: Oxygen Activity Tolerance: Patient limited by pain Patient left: in bed;with call bell/phone within reach           Time: 1610-9604 PT Time Calculation (min) (ACUTE ONLY): 19 min   Charges:   PT Evaluation $PT Eval Moderate Complexity: 1 Procedure     PT G CodesVena Austria 01-05-16, 4:15 PM Durenda Hurt. Renaldo Fiddler, [facility] Acute Rehab Services Pager [phone]
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
## 2015-12-23 NOTE — Consult Note (Signed)
WOC wound consult note Reason for Consult: Numerous scattered ulcers on the bilateral LEs. Etiology not known. Patient describes evolution as blisters that rupture revealing a yellow wound bed followed by drying and scab formation. Wound type: infectious vs traumatic  Pressure Ulcer POA: No Measurement: Numerous ulcers on bilateral LEs:  All but a cluster on the anterior left LE are covered with dried serum (scab). In a 5cm x 3cm area on the anterior left LE there are 4 areas in an earlier "stage" or presentation thatn the others, these have a yellow wound bed.  The largest in this cluster of 4 lesions measures 1cm x 1.5cm. Wound bed:As described above Drainage (amount, consistency, odor) none Periwound:Areas between the lesions are dry and intact Dressing procedure/placement/frequency: This condition is outside the scope of WOC nursing practice albeit I can provide Nursing with conservative guidance for topical care today.  Suggest Dermatology consultation for scraping or biopsy to determine etiology. If you agree, please order. WOC nursing team will not follow, but will remain available to this patient, the nursing and medical teams.  Please re-consult if needed. Thanks, Ladona MowLaurie Jahden Schara, MSN, RN, GNP, Hans EdenCWOCN, CWON-AP, FAAN  Pager# [phone]

## 2015-12-23 NOTE — Progress Notes (Addendum)
Progress Note   Elizabeth BuryMary J [name] UYQ:[id number] DOB: [birth year] DOA: 12/22/2015 PCP: Dorrene GermanEdwin A Avbuere, MD   Brief Narrative:   [name] is an 55 y.o. 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 Upon initial evaluation, BNP was greater than 300 and chest x-ray show trace bilateral streaky opacities  Assessment/Plan:   Principal Problem:   Acute on chronic diastolic CHF (congestive heart failure), NYHA class 1 (HCC)/Moderate mitral stenosis Admitted to telemetry and placed on IV furosemide 40 mg daily. I/O balance overnight -635. Creatinine stable with diuresis.  Active Problems:   Lower extremity excoriations with left lower extremity cellulitis We'll start on doxycycline. Provide hydrocortisone cream to treat pruritus and prevent further excoriations.    Diabetes mellitus, insulin dependent (IDDM), uncontrolled (HCC) /diabetic neuropathy/chronic kidney disease Currently being managed with 60 units of Lantus and moderate scale SSI. Had low blood glucose of 58 this morning. Hold Lantus. Continue gabapentin for peripheral neuropathy. Hemoglobin A1c was 8.4% on 10/13/15.    COPD (chronic obstructive pulmonary disease) (HCC) Continue albuterol as needed.    Stage III chronic kidney disease Creatinine consistent with usual baseline values.    Hypothyroidism TSH 18.432. Continue Synthroid. Suspect patient is noncompliant.    Bipolar I disorder (HCC)/Agoraphobia/depression Continue Abilify, and Xanax as needed.    Anemia of chronic disease Hemoglobin stable with no current indication for transfusion.    DVT Prophylaxis Lovenox ordered.   Family Communication/Anticipated D/C date and plan/Code Status   Family Communication: No family currently at the bedside. Disposition Plan/date: Home  in 1-2 days. Likely needs a home safety evaluation to determine if she is compliant with medical therapy. Code Status: Full code.   Procedures and diagnostic studies:   Dg Chest 2 View  12/22/2015  CLINICAL DATA:  Acute onset of bilateral leg pain and shortness of breath. Wounds on both legs. Initial encounter. EXAM: CHEST  2 VIEW COMPARISON:  Chest radiograph performed 10/16/2015 FINDINGS: The lungs are well-aerated. Mild vascular congestion is noted. Mildly increased interstitial markings may reflect mild interstitial edema. No pleural effusion or pneumothorax is seen. The heart is mildly enlarged. No acute osseous abnormalities are seen. Hardware is noted along the proximal humerus bilaterally. Cervical spinal fusion hardware is partially imaged. IMPRESSION: Mild vascular congestion noted. Mildly increased interstitial markings may reflect mild interstitial edema. Mild cardiomegaly noted. Electronically Signed   By: Roanna RaiderJeffery  Chang M.D.   On: 12/22/2015 22:53    Medical Consultants:    None.  Anti-Infectives:   Anti-infectives    None      Subjective:   Elizabeth BuryMary J Predmore feels a bit better. [name] complaining of lower extremity pain and has multiple excoriations where she has scratched. Had several low blood glucoses this morning. Requesting pain medications and the saline nasal spray.  Objective:    Filed Vitals:   12/23/15 0030 12/23/15 0200 12/23/15 0215 12/23/15 0338  BP: 95/79 94/51 108/82 100/53  Pulse: 63 60 67 68  Temp:    98.8 F (37.1 C)  TempSrc:    Oral  Resp:  18 18 18   Height:    4\' 9"  (1.448 m)  Weight:    102.6 kg (226 lb 3.1 oz)  SpO2: 100% 100% 100% 99%    Intake/Output Summary (Last 24 hours) at 12/23/15 0839 Last  data filed at 12/23/15 0552  Gross per 24 hour  Intake    240 ml  Output    875 ml  Net   -635 ml   Filed Weights   12/23/15 0338  Weight: 102.6 kg (226 lb 3.1 oz)    Exam: Gen:  Anxious Cardiovascular:  RRR, No M/R/G Respiratory:   Lungs CTAB Gastrointestinal:  Abdomen soft, NT/ND, + BS Extremities:  Bilateral excoriations and generalized left lower extremity erythema      Data Reviewed:    Labs: Basic Metabolic Panel:  Recent Labs Lab 12/22/15 2305 12/23/15 0420  NA 139  --   K 4.0  --   CL 100*  --   CO2 27  --   GLUCOSE 105*  --   BUN 28*  --   CREATININE 1.27* 1.22*  CALCIUM 9.1  --   MG  --  1.9   GFR Estimated Creatinine Clearance: 52.2 mL/min (by C-G formula based on Cr of 1.22). Liver Function Tests:  Recent Labs Lab 12/22/15 2305  AST 22  ALT 13*  ALKPHOS 83  BILITOT 0.9  PROT 6.6  ALBUMIN 3.6   CBC:  Recent Labs Lab 12/22/15 2305 12/23/15 0420  WBC 9.4 7.6  NEUTROABS 7.4  --   HGB 9.0* 8.8*  HCT 30.7* 30.0*  MCV 81.2 81.5  PLT 467* 413*   CBG:  Recent Labs Lab 12/23/15 0506 12/23/15 0620 12/23/15 0706  GLUCAP 58* 58* 80   Thyroid function studies:  Recent Labs  12/23/15 0420  TSH 18.432*   Microbiology No results found for this or any previous visit (from the past 240 hour(s)).   Medications:   . ARIPiprazole  5 mg Oral Daily  . aspirin EC  81 mg Oral Daily  . buPROPion  300 mg Oral Daily  . enoxaparin (LOVENOX) injection  40 mg Subcutaneous Daily  . furosemide  40 mg Intravenous Daily  . gabapentin  100 mg Oral TID  . insulin aspart  0-15 Units Subcutaneous TID WC  . insulin aspart  0-5 Units Subcutaneous QHS  . insulin glargine  60 Units Subcutaneous Daily  . levothyroxine  125 mcg Oral QAC breakfast  . potassium chloride SA  20 mEq Oral Daily  . simvastatin  10 mg Oral q1800   Continuous Infusions:   Time spent: 25 minutes.   LOS: 0 days   Beena Catano  [facility] Pager [phone]. If unable to reach me by pager, please call my cell phone at [phone].  *Please refer to [email or web address], password [email or web address] to get updated schedule on who will round on this patient, as hospitalists switch teams weekly. If 7PM-7AM, please contact  night-coverage at [email or web address], password [email or web address] for any overnight needs.  12/23/2015, 8:39 AM

## 2015-12-23 NOTE — ED Notes (Signed)
PA at bedside updating patient.

## 2015-12-23 NOTE — ED Notes (Signed)
Pt requested a small snack due to the time she takes her lantus. Ok per Merrill LynchN Gab pt can  Have 1 pack of graham crackers and a small amount of water

## 2015-12-24 DIAGNOSIS — J9611 Chronic respiratory failure with hypoxia: Secondary | ICD-10-CM

## 2015-12-24 DIAGNOSIS — J9610 Chronic respiratory failure, unspecified whether with hypoxia or hypercapnia: Secondary | ICD-10-CM | POA: Diagnosis present

## 2015-12-24 LAB — BASIC METABOLIC PANEL
Anion gap: 9 (ref 5–15)
BUN: 26 mg/dL — AB (ref 6–20)
CHLORIDE: 100 mmol/L — AB (ref 101–111)
CO2: 30 mmol/L (ref 22–32)
CREATININE: 1.08 mg/dL — AB (ref 0.44–1.00)
Calcium: 8.8 mg/dL — ABNORMAL LOW (ref 8.9–10.3)
GFR calc Af Amer: >60 mL/min (ref >60)
GFR calc non Af Amer: 56 mL/min — ABNORMAL LOW (ref >60)
Glucose, Bld: 197 mg/dL — ABNORMAL HIGH (ref 65–99)
POTASSIUM: 4.1 mmol/L (ref 3.5–5.1)
Sodium: 139 mmol/L (ref 135–145)

## 2015-12-24 LAB — GLUCOSE, CAPILLARY
GLUCOSE-CAPILLARY: 253 mg/dL — AB (ref 65–99)
GLUCOSE-CAPILLARY: 319 mg/dL — AB (ref 65–99)
GLUCOSE-CAPILLARY: 156 mg/dL — AB (ref 65–99)
Glucose-Capillary: 182 mg/dL — ABNORMAL HIGH (ref 65–99)

## 2015-12-24 NOTE — Progress Notes (Signed)
Physical Therapy Treatment Patient Details Name: [name] MRN: [id number] DOB: [birth year] Today's Date: 12/24/2015    History of Present Illness Patient is a 55 yo female admitted 12/22/15 with acute on chronic CHF, LE edema, and LE wounds.   PMH:  CHF, mitral stenosis, DM, chronic pain, HTN, neuropathy, anemia, depression, anxiety, MI, tobacco use, COPD, home O2, arthritis    PT Comments    Patient making slow progress with mobility and gait.  Able to ambulate 7724' with RW.  Fatigues quickly.  Fall risk.  Follow Up Recommendations  Home health PT;Supervision for mobility/OOB (May need to consider SNF if patient does not progress with mobility)     Equipment Recommendations  None recommended by PT    Recommendations for Other Services       Precautions / Restrictions Precautions Precautions: Fall Restrictions Weight Bearing Restrictions: No    Mobility  Bed Mobility Overal bed mobility: Needs Assistance Bed Mobility: Supine to Sit;Sit to Supine     Supine to sit: Supervision Sit to supine: Supervision   General bed mobility comments: Supervision for safety only.  No physical assist.  Transfers Overall transfer level: Needs assistance Equipment used: Rolling walker (2 wheeled) Transfers: Sit to/from Stand Sit to Stand: Supervision         General transfer comment: Patient able to scoot to EOB and power up to standing with no physical assist.  Ambulation/Gait Ambulation/Gait assistance: Min guard Ambulation Distance (Feet): 24 Feet Assistive device: Rolling walker (2 wheeled) Gait Pattern/deviations: Step-through pattern;Decreased stride length;Shuffle;Trunk flexed Gait velocity: decreased Gait velocity interpretation: Below normal speed for age/gender General Gait Details: Verbal cues for safe use of RW.  Patient with very flexed trunk - cues to try to stand upright.  Fatigues quickly.   Stairs            Wheelchair Mobility    Modified Rankin  (Stroke Patients Only)       Balance Overall balance assessment: Needs assistance Sitting-balance support: No upper extremity supported;Feet supported Sitting balance-Leahy Scale: Good     Standing balance support: Bilateral upper extremity supported Standing balance-Leahy Scale: Poor                      Cognition Arousal/Alertness: Awake/alert Behavior During Therapy: Anxious Overall Cognitive Status: Within Functional Limits for tasks assessed                      Exercises      General Comments        Pertinent Vitals/Pain Pain Assessment: 0-10 Pain Score: 5  Pain Location: Generalized Pain Descriptors / Indicators: Aching;Sore Pain Intervention(s): Monitored during session;Repositioned    Home Living                      Prior Function            PT Goals (current goals can now be found in the care plan section) Progress towards PT goals: Progressing toward goals    Frequency  Min 3X/week    PT Plan Current plan remains appropriate    Co-evaluation             End of Session Equipment Utilized During Treatment: Gait belt;Oxygen Activity Tolerance: Patient limited by pain;Patient limited by fatigue Patient left: in bed;with call bell/phone within reach     Time: 1056-1109 PT Time Calculation (min) (ACUTE ONLY): 13 min  Charges:  $Gait Training: 8-22 mins  G Codes:      Vena Austria 12/24/2015, 3:25 PM Durenda Hurt. Renaldo Fiddler, [facility] Acute Rehab Services Pager [phone]

## 2015-12-24 NOTE — Progress Notes (Signed)
Progress Note   [name] [id number] DOB: Feb 21, 1961 DOA: 12/22/2015 PCP: Dorrene GermanEdwin A Avbuere, MD   Brief Narrative:   [name] is an 55 y.o. 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 Upon initial evaluation, BNP was greater than 300 and chest x-ray show trace bilateral streaky opacities  Assessment/Plan:   Principal Problem:   Acute on chronic diastolic CHF (congestive heart failure), NYHA class 1 (HCC)/Moderate mitral stenosis Admitted to telemetry and placed on IV furosemide 40 mg daily. I/O balance from admission -1.4 L. Creatinine improved with diuresis.  Active Problems:   Lower extremity excoriations with left lower extremity cellulitis Continue doxycycline and hydrocortisone cream to treat pruritus and prevent further excoriations.    Diabetes mellitus, insulin dependent (IDDM), uncontrolled (HCC) /diabetic neuropathy/chronic kidney disease Currently being managed with moderate scale SSI. Lantus was discontinued secondary to low blood glucose readings. CBGs 80-182. Continue gabapentin for peripheral neuropathy. Hemoglobin A1c was 8.4% on 10/13/15.    COPD (chronic obstructive pulmonary disease) (HCC) / chronic respiratory failure Continue albuterol as needed. On 3 L oxygen chronically.    Stage III chronic kidney disease Creatinine consistent with usual baseline values.    Hypothyroidism TSH 18.432. Continue Synthroid. Suspect patient is noncompliant.    Bipolar I disorder (HCC)/Agoraphobia/depression Continue Abilify, and Xanax as needed.    Anemia of chronic disease Hemoglobin stable with no current indication for transfusion.    DVT Prophylaxis Lovenox ordered.   Family Communication/Anticipated D/C date and plan/Code Status   Family Communication: No family  currently at the bedside. Disposition Plan/date: Home in 1-2 days. Likely needs a home safety evaluation to determine if she is compliant with medical therapy. Code Status: Full code.   Procedures and diagnostic studies:   Dg Chest 2 View  12/22/2015  CLINICAL DATA:  Acute onset of bilateral leg pain and shortness of breath. Wounds on both legs. Initial encounter. EXAM: CHEST  2 VIEW COMPARISON:  Chest radiograph performed 10/16/2015 FINDINGS: The lungs are well-aerated. Mild vascular congestion is noted. Mildly increased interstitial markings may reflect mild interstitial edema. No pleural effusion or pneumothorax is seen. The heart is mildly enlarged. No acute osseous abnormalities are seen. Hardware is noted along the proximal humerus bilaterally. Cervical spinal fusion hardware is partially imaged. IMPRESSION: Mild vascular congestion noted. Mildly increased interstitial markings may reflect mild interstitial edema. Mild cardiomegaly noted. Electronically Signed   By: Roanna RaiderJeffery  Chang M.D.   On: 12/22/2015 22:53    Medical Consultants:    None.  Anti-Infectives:   Anti-infectives    Start     Dose/Rate Route Frequency Ordered Stop   12/23/15 1200  doxycycline (VIBRA-TABS) tablet 100 mg     100 mg Oral Every 12 hours 12/23/15 1152        Subjective:   [name] feels a bit better. Says she feels anxious "all the time".  Xanax takes the edge off.  Has chronic back and leg pain, neck pain.  Pain meds help some.  Ambulated some, but still very short of breath when up.    Objective:    Filed Vitals:   12/23/15 1247 12/23/15 2131 12/24/15 0009 12/24/15 0604  BP: 97/45 102/57 98/51 116/54  Pulse: 70 74 70 74  Temp: 97.7 F (36.5 C) 97.8 F (36.6 C) 97.3  F (36.3 C) 98.1 F (36.7 C)  TempSrc: Oral Oral Oral Oral  Resp: Height:      Weight:    101.696 kg (224 lb 3.2 oz)  SpO2: 100% 98% 100% 100%    Intake/Output Summary (Last 24 hours) at 12/24/15 0756 Last  data filed at 12/24/15 0528  Gross per 24 hour  Intake    720 ml  Output   1575 ml  Net   -855 ml   Filed Weights   12/23/15 0338 12/24/15 0604  Weight: 102.6 kg (226 lb 3.1 oz) 101.696 kg (224 lb 3.2 oz)    Exam: Gen:  Anxious Cardiovascular:  RRR, No M/R/G Respiratory:  Lungs CTAB Gastrointestinal:  Abdomen soft, NT/ND, + BS Extremities:  Bilateral excoriations and generalized left lower extremity erythema      Data Reviewed:    Labs: Basic Metabolic Panel:  Recent Labs Lab 12/22/15 2305 12/23/15 0420 12/24/15 0353  NA 139  --  139  K 4.0  --  4.1  CL 100*  --  100*  CO2 27  --  30  GLUCOSE 105*  --  197*  BUN 28*  --  26*  CREATININE 1.27* 1.22* 1.08*  CALCIUM 9.1  --  8.8*  MG  --  1.9  --    GFR Estimated Creatinine Clearance: 58.6 mL/min (by C-G formula based on Cr of 1.08). Liver Function Tests:  Recent Labs Lab 12/22/15 2305  AST 22  ALT 13*  ALKPHOS 83  BILITOT 0.9  PROT 6.6  ALBUMIN 3.6   CBC:  Recent Labs Lab 12/22/15 2305 12/23/15 0420  WBC 9.4 7.6  NEUTROABS 7.4  --   HGB 9.0* 8.8*  HCT 30.7* 30.0*  MCV 81.2 81.5  PLT 467* 413*   CBG:  Recent Labs Lab 12/23/15 0706 12/23/15 1159 12/23/15 1642 12/23/15 2134 12/24/15 0708  GLUCAP 80 158* 133* 137* 182*   Thyroid function studies:  Recent Labs  12/23/15 0420  TSH 18.432*   Microbiology No results found for this or any previous visit (from the past 240 hour(s)).   Medications:   . ALPRAZolam  0.5 mg Oral BID  . ARIPiprazole  5 mg Oral Daily  . aspirin EC  81 mg Oral Daily  . bacitracin   Topical BID  . doxycycline  100 mg Oral Q12H  . enoxaparin (LOVENOX) injection  40 mg Subcutaneous Daily  . furosemide  40 mg Intravenous Daily  . gabapentin  100 mg Oral TID  . hydrocortisone valerate cream   Topical BID  . insulin aspart  0-15 Units Subcutaneous TID WC  . insulin aspart  0-5 Units Subcutaneous QHS  . levothyroxine  125 mcg Oral QAC breakfast  .  potassium chloride SA  20 mEq Oral Daily  . simvastatin  10 mg Oral q1800   Continuous Infusions:   Time spent: 25 minutes.   LOS: 1 day   Launi Asencio  [facility] Pager [phone]. If unable to reach me by pager, please call my cell phone at [phone].  *Please refer to [email or web address], password [email or web address] to get updated schedule on who will round on this patient, as hospitalists switch teams weekly. If 7PM-7AM, please contact night-coverage at [email or web address], password [email or web address] for any overnight needs.  12/24/2015, 7:56 AM

## 2015-12-25 ENCOUNTER — Inpatient Hospital Stay (HOSPITAL_COMMUNITY): Payer: Medicaid Other

## 2015-12-25 ENCOUNTER — Encounter (HOSPITAL_COMMUNITY): Payer: Self-pay | Admitting: General Practice

## 2015-12-25 DIAGNOSIS — J9601 Acute respiratory failure with hypoxia: Secondary | ICD-10-CM

## 2015-12-25 DIAGNOSIS — J441 Chronic obstructive pulmonary disease with (acute) exacerbation: Secondary | ICD-10-CM | POA: Diagnosis present

## 2015-12-25 DIAGNOSIS — J9600 Acute respiratory failure, unspecified whether with hypoxia or hypercapnia: Secondary | ICD-10-CM

## 2015-12-25 DIAGNOSIS — I952 Hypotension due to drugs: Secondary | ICD-10-CM | POA: Insufficient documentation

## 2015-12-25 DIAGNOSIS — J9620 Acute and chronic respiratory failure, unspecified whether with hypoxia or hypercapnia: Secondary | ICD-10-CM | POA: Diagnosis present

## 2015-12-25 DIAGNOSIS — R600 Localized edema: Secondary | ICD-10-CM | POA: Insufficient documentation

## 2015-12-25 DIAGNOSIS — I5033 Acute on chronic diastolic (congestive) heart failure: Secondary | ICD-10-CM

## 2015-12-25 DIAGNOSIS — I509 Heart failure, unspecified: Secondary | ICD-10-CM | POA: Insufficient documentation

## 2015-12-25 LAB — POCT I-STAT 3, ART BLOOD GAS (G3+)
ACID-BASE EXCESS: 4 mmol/L — AB (ref 0.0–2.0)
Bicarbonate: 32.2 mEq/L — ABNORMAL HIGH (ref 20.0–24.0)
O2 SAT: 93 %
PCO2 ART: 66.4 mmHg — AB (ref 35.0–45.0)
PH ART: 7.291 — AB (ref 7.350–7.450)
Patient temperature: 97.5
TCO2: 34 mmol/L (ref 0–100)
pO2, Arterial: 73 mmHg — ABNORMAL LOW (ref 80.0–100.0)

## 2015-12-25 LAB — BASIC METABOLIC PANEL
Anion gap: 10 (ref 5–15)
BUN: 21 mg/dL — ABNORMAL HIGH (ref 6–20)
CALCIUM: 9.2 mg/dL (ref 8.9–10.3)
CO2: 30 mmol/L (ref 22–32)
CREATININE: 0.97 mg/dL (ref 0.44–1.00)
Chloride: 99 mmol/L — ABNORMAL LOW (ref 101–111)
GFR calc non Af Amer: >60 mL/min (ref >60)
Glucose, Bld: 291 mg/dL — ABNORMAL HIGH (ref 65–99)
Potassium: 4.4 mmol/L (ref 3.5–5.1)
SODIUM: 139 mmol/L (ref 135–145)

## 2015-12-25 LAB — BLOOD GAS, ARTERIAL
Acid-Base Excess: 4.5 mmol/L — ABNORMAL HIGH (ref 0.0–2.0)
BICARBONATE: 29.6 meq/L — AB (ref 20.0–24.0)
Drawn by: 280981
O2 CONTENT: 8 L/min
O2 Saturation: 91.3 %
PCO2 ART: 53.2 mmHg — AB (ref 35.0–45.0)
PH ART: 7.364 (ref 7.350–7.450)
Patient temperature: 98.6
TCO2: 31.2 mmol/L (ref 0–100)
pO2, Arterial: 67.2 mmHg — ABNORMAL LOW (ref 80.0–100.0)

## 2015-12-25 LAB — MRSA PCR SCREENING: MRSA by PCR: NEGATIVE

## 2015-12-25 LAB — GLUCOSE, CAPILLARY
GLUCOSE-CAPILLARY: 249 mg/dL — AB (ref 65–99)
GLUCOSE-CAPILLARY: 354 mg/dL — AB (ref 65–99)
Glucose-Capillary: 350 mg/dL — ABNORMAL HIGH (ref 65–99)
Glucose-Capillary: 352 mg/dL — ABNORMAL HIGH (ref 65–99)

## 2015-12-25 LAB — HEMOGLOBIN A1C
Hgb A1c MFr Bld: 6.7 % — ABNORMAL HIGH (ref 4.8–5.6)
Mean Plasma Glucose: 146 mg/dL

## 2015-12-25 LAB — BRAIN NATRIURETIC PEPTIDE: B Natriuretic Peptide: 477.5 pg/mL — ABNORMAL HIGH (ref 0.0–100.0)

## 2015-12-25 MED ORDER — PHENYLEPHRINE HCL 10 MG/ML IJ SOLN
0.0000 ug/min | INTRAVENOUS | Status: DC
  Filled 2015-12-25: qty 1

## 2015-12-25 MED ORDER — ALBUTEROL SULFATE (2.5 MG/3ML) 0.083% IN NEBU
2.5000 mg | INHALATION_SOLUTION | Freq: Four times a day (QID) | RESPIRATORY_TRACT | Status: DC
  Administered 2015-12-25 – 2015-12-26 (×3): 2.5 mg via RESPIRATORY_TRACT
  Filled 2015-12-25 (×3): qty 3

## 2015-12-25 MED ORDER — ALBUTEROL SULFATE (2.5 MG/3ML) 0.083% IN NEBU
2.5000 mg | INHALATION_SOLUTION | Freq: Once | RESPIRATORY_TRACT | Status: AC
  Administered 2015-12-25: 2.5 mg via RESPIRATORY_TRACT

## 2015-12-25 MED ORDER — CHLORHEXIDINE GLUCONATE 0.12% ORAL RINSE (MEDLINE KIT)
15.0000 mL | Freq: Two times a day (BID) | OROMUCOSAL | Status: DC
  Administered 2015-12-25 – 2016-01-05 (×21): 15 mL via OROMUCOSAL

## 2015-12-25 MED ORDER — ARTIFICIAL TEARS OP OINT
1.0000 "application " | TOPICAL_OINTMENT | Freq: Three times a day (TID) | OPHTHALMIC | Status: DC
  Administered 2015-12-25 – 2015-12-27 (×6): 1 via OPHTHALMIC
  Filled 2015-12-25 (×2): qty 3.5

## 2015-12-25 MED ORDER — PANTOPRAZOLE SODIUM 40 MG IV SOLR
40.0000 mg | INTRAVENOUS | Status: DC
  Administered 2015-12-25: 40 mg via INTRAVENOUS
  Filled 2015-12-25: qty 40

## 2015-12-25 MED ORDER — INSULIN GLARGINE 100 UNIT/ML ~~LOC~~ SOLN
15.0000 [IU] | SUBCUTANEOUS | Status: DC
  Administered 2015-12-26: 15 [IU] via SUBCUTANEOUS
  Filled 2015-12-25: qty 0.15

## 2015-12-25 MED ORDER — FENTANYL CITRATE (PF) 100 MCG/2ML IJ SOLN
100.0000 ug | INTRAMUSCULAR | Status: DC | PRN
  Administered 2015-12-25 – 2016-01-03 (×4): 100 ug via INTRAVENOUS
  Filled 2015-12-25 (×4): qty 2

## 2015-12-25 MED ORDER — MIDAZOLAM HCL 2 MG/2ML IJ SOLN
2.0000 mg | Freq: Once | INTRAMUSCULAR | Status: AC
  Administered 2015-12-25: 2 mg via INTRAVENOUS

## 2015-12-25 MED ORDER — SIMVASTATIN 20 MG PO TABS
10.0000 mg | ORAL_TABLET | Freq: Every day | ORAL | Status: DC
  Administered 2015-12-25 – 2016-01-04 (×11): 10 mg
  Filled 2015-12-25 (×11): qty 1

## 2015-12-25 MED ORDER — FENTANYL BOLUS VIA INFUSION
50.0000 ug | INTRAVENOUS | Status: DC | PRN
  Filled 2015-12-25: qty 50

## 2015-12-25 MED ORDER — ANTISEPTIC ORAL RINSE SOLUTION (CORINZ)
7.0000 mL | OROMUCOSAL | Status: DC
  Administered 2015-12-25 – 2016-01-04 (×93): 7 mL via OROMUCOSAL

## 2015-12-25 MED ORDER — MORPHINE SULFATE (PF) 4 MG/ML IV SOLN: 4.0000 mg | INTRAVENOUS | Status: DC | PRN

## 2015-12-25 MED ORDER — METHYLPREDNISOLONE SODIUM SUCC 125 MG IJ SOLR
80.0000 mg | Freq: Four times a day (QID) | INTRAMUSCULAR | Status: DC
  Administered 2015-12-25: 80 mg via INTRAVENOUS
  Filled 2015-12-25: qty 2

## 2015-12-25 MED ORDER — PIPERACILLIN-TAZOBACTAM 3.375 G IVPB 30 MIN
3.3750 g | Freq: Once | INTRAVENOUS | Status: AC
  Administered 2015-12-25: 3.375 g via INTRAVENOUS
  Filled 2015-12-25: qty 50

## 2015-12-25 MED ORDER — LORAZEPAM 2 MG/ML IJ SOLN
INTRAMUSCULAR | Status: AC
  Filled 2015-12-25: qty 1

## 2015-12-25 MED ORDER — PIPERACILLIN-TAZOBACTAM 3.375 G IVPB
3.3750 g | Freq: Three times a day (TID) | INTRAVENOUS | Status: DC
  Administered 2015-12-26 – 2016-01-01 (×20): 3.375 g via INTRAVENOUS
  Filled 2015-12-25 (×22): qty 50

## 2015-12-25 MED ORDER — LEVOTHYROXINE SODIUM 25 MCG PO TABS
125.0000 ug | ORAL_TABLET | Freq: Every day | ORAL | Status: DC
  Administered 2015-12-26 – 2016-01-05 (×11): 125 ug
  Filled 2015-12-25 (×11): qty 1

## 2015-12-25 MED ORDER — SODIUM CHLORIDE 0.9 % IV SOLN
25.0000 ug/h | INTRAVENOUS | Status: DC
  Administered 2015-12-25: 75 ug/h via INTRAVENOUS
  Administered 2015-12-27: 300 ug/h via INTRAVENOUS
  Filled 2015-12-25 (×3): qty 50

## 2015-12-25 MED ORDER — INSULIN ASPART 100 UNIT/ML ~~LOC~~ SOLN
0.0000 [IU] | SUBCUTANEOUS | Status: DC
  Administered 2015-12-25 – 2015-12-26 (×3): 15 [IU] via SUBCUTANEOUS
  Administered 2015-12-26 (×3): 8 [IU] via SUBCUTANEOUS
  Administered 2015-12-26 (×2): 15 [IU] via SUBCUTANEOUS

## 2015-12-25 MED ORDER — ANTISEPTIC ORAL RINSE SOLUTION (CORINZ): 7.0000 mL | Freq: Four times a day (QID) | OROMUCOSAL | Status: DC

## 2015-12-25 MED ORDER — FUROSEMIDE 10 MG/ML IJ SOLN
INTRAMUSCULAR | Status: AC
  Administered 2015-12-25: 40 mg via INTRAVENOUS
  Filled 2015-12-25: qty 4

## 2015-12-25 MED ORDER — ARIPIPRAZOLE 5 MG PO TABS
5.0000 mg | ORAL_TABLET | Freq: Every day | ORAL | Status: DC
  Administered 2015-12-26 – 2016-01-04 (×10): 5 mg
  Filled 2015-12-25 (×11): qty 1

## 2015-12-25 MED ORDER — MIDAZOLAM HCL 2 MG/2ML IJ SOLN
2.0000 mg | Freq: Once | INTRAMUSCULAR | Status: AC | PRN
  Administered 2015-12-25: 2 mg via INTRAVENOUS
  Filled 2015-12-25: qty 2

## 2015-12-25 MED ORDER — FENTANYL CITRATE (PF) 100 MCG/2ML IJ SOLN
100.0000 ug | Freq: Once | INTRAMUSCULAR | Status: AC
  Administered 2015-12-25: 100 ug via INTRAVENOUS

## 2015-12-25 MED ORDER — FENTANYL CITRATE (PF) 100 MCG/2ML IJ SOLN
INTRAMUSCULAR | Status: AC
  Administered 2015-12-25: 100 ug via INTRAVENOUS
  Filled 2015-12-25: qty 2

## 2015-12-25 MED ORDER — METHYLPREDNISOLONE SODIUM SUCC 125 MG IJ SOLR
80.0000 mg | Freq: Two times a day (BID) | INTRAMUSCULAR | Status: DC
  Administered 2015-12-25: 80 mg via INTRAVENOUS
  Filled 2015-12-25: qty 2

## 2015-12-25 MED ORDER — MORPHINE SULFATE (PF) 4 MG/ML IV SOLN
4.0000 mg | Freq: Once | INTRAVENOUS | Status: AC
  Administered 2015-12-25: 4 mg via INTRAVENOUS
  Filled 2015-12-25: qty 1

## 2015-12-25 MED ORDER — VANCOMYCIN HCL 10 G IV SOLR
2000.0000 mg | Freq: Once | INTRAVENOUS | Status: AC
  Administered 2015-12-25: 2000 mg via INTRAVENOUS
  Filled 2015-12-25: qty 2000

## 2015-12-25 MED ORDER — FENTANYL CITRATE (PF) 100 MCG/2ML IJ SOLN: 100.0000 ug | Freq: Once | INTRAMUSCULAR | Status: DC | PRN

## 2015-12-25 MED ORDER — CHLORHEXIDINE GLUCONATE 0.12% ORAL RINSE (MEDLINE KIT): 15.0000 mL | Freq: Two times a day (BID) | OROMUCOSAL | Status: DC

## 2015-12-25 MED ORDER — CETYLPYRIDINIUM CHLORIDE 0.05 % MT LIQD
7.0000 mL | Freq: Two times a day (BID) | OROMUCOSAL | Status: DC
Start: 2015-12-25 — End: 2015-12-25
  Administered 2015-12-25: 7 mL via OROMUCOSAL

## 2015-12-25 MED ORDER — CISATRACURIUM BOLUS VIA INFUSION
0.1000 mg/kg | Freq: Once | INTRAVENOUS | Status: AC
  Administered 2015-12-25: 10.1 mg via INTRAVENOUS
  Filled 2015-12-25: qty 11

## 2015-12-25 MED ORDER — ASPIRIN 81 MG PO CHEW
81.0000 mg | CHEWABLE_TABLET | Freq: Every day | ORAL | Status: DC
  Administered 2015-12-25 – 2016-01-04 (×11): 81 mg
  Filled 2015-12-25 (×12): qty 1

## 2015-12-25 MED ORDER — MIDAZOLAM HCL 2 MG/2ML IJ SOLN
INTRAMUSCULAR | Status: AC
  Administered 2015-12-25: 2 mg via INTRAVENOUS
  Filled 2015-12-25: qty 2

## 2015-12-25 MED ORDER — FUROSEMIDE 10 MG/ML IJ SOLN
40.0000 mg | Freq: Once | INTRAMUSCULAR | Status: AC
  Administered 2015-12-25: 40 mg via INTRAVENOUS

## 2015-12-25 MED ORDER — CISATRACURIUM BESYLATE (PF) 200 MG/20ML IV SOLN
3.0000 ug/kg/min | INTRAVENOUS | Status: DC
  Administered 2015-12-25 (×2): 3 ug/kg/min via INTRAVENOUS
  Administered 2015-12-26: 2 ug/kg/min via INTRAVENOUS
  Administered 2015-12-27: 1.5 ug/kg/min via INTRAVENOUS
  Filled 2015-12-25 (×4): qty 20

## 2015-12-25 MED ORDER — FUROSEMIDE 10 MG/ML IJ SOLN: 80.0000 mg | Freq: Two times a day (BID) | INTRAMUSCULAR | Status: DC

## 2015-12-25 MED ORDER — ROCURONIUM BROMIDE 50 MG/5ML IV SOLN
50.0000 mg | Freq: Once | INTRAVENOUS | Status: AC
  Administered 2015-12-25: 50 mg via INTRAVENOUS

## 2015-12-25 MED ORDER — MIDAZOLAM BOLUS VIA INFUSION
2.0000 mg | INTRAVENOUS | Status: DC | PRN
  Filled 2015-12-25: qty 2

## 2015-12-25 MED ORDER — SODIUM CHLORIDE 0.9 % IV SOLN
1.0000 mg/h | INTRAVENOUS | Status: DC
  Administered 2015-12-25: 1 mg/h via INTRAVENOUS
  Administered 2015-12-26: 2 mg/h via INTRAVENOUS
  Administered 2015-12-27: 4 mg/h via INTRAVENOUS
  Filled 2015-12-25 (×3): qty 10

## 2015-12-25 MED ORDER — ETOMIDATE 2 MG/ML IV SOLN
20.0000 mg | Freq: Once | INTRAVENOUS | Status: AC
  Administered 2015-12-25: 20 mg via INTRAVENOUS

## 2015-12-25 MED ORDER — VANCOMYCIN HCL 10 G IV SOLR
1250.0000 mg | Freq: Two times a day (BID) | INTRAVENOUS | Status: DC
  Administered 2015-12-26 – 2015-12-28 (×5): 1250 mg via INTRAVENOUS
  Filled 2015-12-25 (×7): qty 1250

## 2015-12-25 NOTE — Procedures (Signed)
Central Venous Catheter Insertion Procedure Note Evaristo BuryMary J Porcaro [id number] [birth year]  Procedure: Insertion of Central Venous Catheter Indications: Assessment of intravascular volume, Drug and/or fluid administration and Frequent blood sampling  Procedure Details Consent: Unable to obtain consent because of emergent medical necessity. Time Out: Verified patient identification, verified procedure, site/side was marked, verified correct patient position, special equipment/implants available, medications/allergies/relevent history reviewed, required imaging and test results available.  Performed Real time US was used to ID and cannulate the vessel  Maximum sterile technique was used including antiseptics, cap, gloves, gown, hand hygiene, mask and sheet. Skin prep: Chlorhexidine; local anesthetic administered A antimicrobial bonded/coated triple lumen catheter was placed in the left internal jugular vein using the Seldinger technique.  Evaluation Blood flow good Complications: No apparent complications Patient did tolerate procedure well. Chest X-ray ordered to verify placement.  CXR: normal placement of CVL .  Shelby Mattocksete E Jeslynn Hollander 12/25/2015, 4:28 PM

## 2015-12-25 NOTE — Progress Notes (Signed)
Pharmacy Antibiotic Note [name] is a 55 y.o. female admitted on 12/22/2015 with CHF that developed respiratory distress requiring intubation earlier today. Pharmacy has been consulted for Zosyn and Vancomycin dosing for coverage of pneumonia. Of note, she is on chronic doxycyline for pruritis and excoriations.  Plan: 1. Vancomycin 2000 mg loading dose followed by vancomycin 1250 mg Q 12 hours  2. If vancomycin continued [facility]; goal 15 - 20 3. Zosyn 3.375g IV q8h (4 hour infusion).  4. Following along with you daily    Height: 5' (152.4 cm) Weight: 221 lb 9.6 oz (100.517 kg) (a scale) IBW/kg (Calculated) : 45.5  Temp (24hrs), Avg:98.1 F (36.7 C), Min:97.8 F (36.6 C), Max:98.5 F (36.9 C)   Recent Labs Lab 12/22/15 2305 12/23/15 0420 12/24/15 0353 12/25/15 0612  WBC 9.4 7.6  --   --   CREATININE 1.27* 1.22* 1.08* 0.97    Estimated Creatinine Clearance: 69 mL/min (by C-G formula based on Cr of 0.97).    Allergies  Allergen Reactions  . Trazodone And Nefazodone Anaphylaxis and Other (See Comments)    Shortness of breath.  . Celecoxib Nausea Only  . Ibuprofen Nausea Only   Antimicrobials this admission: 4/3 Zosyn >>  4/3 Vancomycin >>   PTA Doxycycline >>   Dose adjustments this admission: n/a  Microbiology results: px  Thank you for allowing pharmacy to be a part of this patient's care. Pollyann SamplesAndy Merlin [name], PharmD, BCPS 12/25/2015, 3:55 PM Pager: [phone]

## 2015-12-25 NOTE — Procedures (Signed)
Intubation Procedure Note [name] [id number] Jul 26, 1961  Procedure: Intubation Indications: Respiratory insufficiency  Procedure Details Consent: Unable to obtain consent because of emergent medical necessity. Time Out: Verified patient identification, verified procedure, site/side was marked, verified correct patient position, special equipment/implants available, medications/allergies/relevent history reviewed, required imaging and test results available.  Performed  [name] and 3   Evaluation Hemodynamic Status: BP stable throughout; O2 sats: stable throughout Patient's Current Condition: stable Complications: Complications of aspiration of food Patient did tolerate procedure well. Chest X-ray ordered to verify placement.  CXR: tube position acceptable.   [name] 12/25/2015

## 2015-12-25 NOTE — Procedures (Signed)
Arterial Catheter Insertion Procedure Note Elizabeth BuryMary J [name] [id number] 1961-04-03  Procedure: Insertion of Arterial Catheter  Indications: Blood pressure monitoring and Frequent blood sampling  Procedure Details Consent: Unable to obtain consent because of altered level of consciousness. Time Out: Verified patient identification, verified procedure, site/side was marked, verified correct patient position, special equipment/implants available, medications/allergies/relevent history reviewed, required imaging and test results available.  Performed  Maximum sterile technique was used including antiseptics, cap, gloves, gown, hand hygiene, mask and sheet. Skin prep: Chlorhexidine; local anesthetic administered 20 gauge catheter was inserted into left femoral artery using the Seldinger technique.  Evaluation Blood flow good; BP tracing good. Complications: No apparent complications.   Joneen RoachPaul Hoffman, AGACNP-BC [facility]/Critical Care Pager [phone] or [phone]  12/25/2015 9:22 PM

## 2015-12-25 NOTE — Progress Notes (Signed)
Inpatient Diabetes Program Recommendations  AACE/ADA: New Consensus Statement on Inpatient Glycemic Control (2015)  Target Ranges:  Prepandial:   less than 140 mg/dL      Peak postprandial:   less than 180 mg/dL (1-2 hours)      Critically ill patients:  140 - 180 mg/dL  Results for Evaristo BuryMCBRIDE, Krystalyn J (MRN [id number]) as of 12/25/2015 09:29  Ref. Range 12/24/2015 07:08 12/24/2015 12:14 12/24/2015 16:32 12/24/2015 21:32 12/25/2015 05:54  Glucose-Capillary Latest Ref Range: 65-99 mg/dL 409182 (H) 811253 (H) 914319 (H) 156 (H) 249 (H)   Review of Glycemic Control  Diabetes history: DM2 Outpatient Diabetes medications: Amaryl 4 mg daily, Lantus 48 units QHS, Novolog 6-15 units TID with meals Current orders for Inpatient glycemic control: Novolog 0-15 units TID with meals, Novolog 0-5 units QHS  Inpatient Diabetes Program Recommendations: Insulin - Basal: Please consider ordering Lantus 10 units daily starting now (based on 100 kg x 0.1 units). Insulin - Meal Coverage: If patient is eating at least 50% of meals, please consider ordering Novolog 3 units TID with meals for meal coverage.  Thanks, Orlando PennerMarie Freddie Nghiem, RN, MSN, CDE Diabetes Coordinator Inpatient Diabetes Program [phone] (Team Pager from 8am to 5pm) [phone] ([facility] office) [phone] [facility] office) [phone] [facility] office)

## 2015-12-25 NOTE — Progress Notes (Signed)
Progress Note   Elizabeth BuryMary J [name] [id number] DOB: 11/09/1960 DOA: 12/22/2015 PCP: Dorrene GermanEdwin A Avbuere, MD   Brief Narrative:   [name] is an 55 y.o. 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 Upon initial evaluation, BNP was greater than 300 and chest x-ray show trace bilateral streaky opacities. The patient was doing fairly well with diuresis until 12/25/15 when she developed significant respiratory distress. She was intolerant of BiPAP and the critical care team was subsequently consulted for intubation.  Assessment/Plan:   Principal Problem:   Acute on chronic diastolic CHF (congestive heart failure), NYHA class 1 (HCC)/Moderate mitral stenosis Admitted to telemetry and placed on IV furosemide 40 mg daily. I/O balance from admission -1.9 L. Creatinine improved with diuresis.Today, the patient developed worsening respiratory distress and crackles throughout both lung fields. 80 mg of IV furosemide was given with chest radiography showing pulmonary edema. Continue Lasix 80 mg IV twice a day.    Acute on chronic respiratory failure with hypoxia and hypercarbia/COPD exacerbation Solu-Medrol was started 12/25/15. Despite increased dose of Lasix and initiation of Solu-Medrol as well as bronchodilator therapy, the patient's respiratory status continued to deteriorate. A blood gas showed hypoxia and hypercarbia. Patient was intolerant of BiPAP, and could not maintain her oxygen saturations on high flow oxygen. She's been transferred to the ICU for intubation and mechanical ventilation.  Active Problems:   Lower extremity excoriations with left lower extremity cellulitis Continue doxycycline and hydrocortisone cream to treat pruritus and prevent further excoriations.    Diabetes mellitus, insulin dependent  (IDDM), uncontrolled (HCC) /diabetic neuropathy/chronic kidney disease Currently being managed with moderate scale SSI. Lantus was discontinued secondary to low blood glucose readings. CBGs 156-350. Resume Lantus at a lower dose. Continue gabapentin for peripheral neuropathy. Hemoglobin A1c was 8.4% on 10/13/15.    Stage III chronic kidney disease Creatinine consistent with usual baseline values.    Hypothyroidism TSH 18.432. Continue Synthroid. Suspect patient is noncompliant.    Bipolar I disorder (HCC)/Agoraphobia/depression Continue Abilify, and Xanax as needed.    Anemia of chronic disease Hemoglobin stable with no current indication for transfusion.    DVT Prophylaxis Lovenox ordered.   Family Communication/Anticipated D/C date and plan/Code Status   Family Communication: No family currently at the bedside. Disposition Plan/date: Transferring to the ICU under the care of the critical care team due to VDRF. Code Status: Full code.   Procedures and diagnostic studies:   Dg Chest 2 View  12/22/2015  CLINICAL DATA:  Acute onset of bilateral leg pain and shortness of breath. Wounds on both legs. Initial encounter. EXAM: CHEST  2 VIEW COMPARISON:  Chest radiograph performed 10/16/2015 FINDINGS: The lungs are well-aerated. Mild vascular congestion is noted. Mildly increased interstitial markings may reflect mild interstitial edema. No pleural effusion or pneumothorax is seen. The heart is mildly enlarged. No acute osseous abnormalities are seen. Hardware is noted along the proximal humerus bilaterally. Cervical spinal fusion hardware is partially imaged. IMPRESSION: Mild vascular congestion noted. Mildly increased interstitial markings may reflect mild interstitial edema. Mild cardiomegaly noted. Electronically Signed   By: Roanna RaiderJeffery  Chang M.D.   On: 12/22/2015 22:53   Dg Chest Port 1 View  12/25/2015  CLINICAL DATA:  Shortness of breath. EXAM: PORTABLE CHEST 1 VIEW COMPARISON:   12/22/2015. FINDINGS: Cardiomegaly persists. Worsening aeration  with interstitial and alveolar prominence bilaterally suggesting CHF. BILATERAL pneumonia less favored. Previous LEFT shoulder replacement, and cervical fusion. Incomplete healing of the RIGHT humeral fracture status post ORIF. IMPRESSION: Worsening aeration.  CHF is favored. Electronically Signed   By: Elsie Stain M.D.   On: 12/25/2015 11:09    Medical Consultants:    None.  Anti-Infectives:   Anti-infectives    Start     Dose/Rate Route Frequency Ordered Stop   12/23/15 1200  doxycycline (VIBRA-TABS) tablet 100 mg     100 mg Oral Every 12 hours 12/23/15 1152        Subjective:   [name] has developed worsening respiratory failure, increased work of breathing with use of accessory muscles, and significant anxiety related to her breathing problems.  Objective:    Filed Vitals:   12/25/15 1200 12/25/15 1314 12/25/15 1317 12/25/15 1422  BP: 149/83   146/69  Pulse: 129   126  Temp:      TempSrc:      Resp:      Height:      Weight:      SpO2: 87% 90% 93% 86%    Intake/Output Summary (Last 24 hours) at 12/25/15 1514 Last data filed at 12/25/15 1437  Gross per 24 hour  Intake    820 ml  Output   1300 ml  Net   -480 ml   Filed Weights   12/23/15 0338 12/24/15 0604 12/25/15 0534  Weight: 102.6 kg (226 lb 3.1 oz) 101.696 kg (224 lb 3.2 oz) 100.517 kg (221 lb 9.6 oz)    Exam: Gen:  Anxious Cardiovascular:  Tachycardic, No M/R/G Respiratory:  Lungs With bilateral rales and occasional wheezes. Gastrointestinal:  Abdomen soft, NT/ND, + BS Extremities:  Bilateral excoriations and generalized left lower extremity erythema      Data Reviewed:    Labs: Basic Metabolic Panel:  Recent Labs Lab 12/22/15 2305 12/23/15 0420 12/24/15 0353 12/25/15 0612  NA 139  --  139 139  K 4.0  --  4.1 4.4  CL 100*  --  100* 99*  CO2 27  --  30 30  GLUCOSE 105*  --  197* 291*  BUN 28*  --  26* 21*    CREATININE 1.27* 1.22* 1.08* 0.97  CALCIUM 9.1  --  8.8* 9.2  MG  --  1.9  --   --    GFR Estimated Creatinine Clearance: 64.8 mL/min (by C-G formula based on Cr of 0.97). Liver Function Tests:  Recent Labs Lab 12/22/15 2305  AST 22  ALT 13*  ALKPHOS 83  BILITOT 0.9  PROT 6.6  ALBUMIN 3.6   CBC:  Recent Labs Lab 12/22/15 2305 12/23/15 0420  WBC 9.4 7.6  NEUTROABS 7.4  --   HGB 9.0* 8.8*  HCT 30.7* 30.0*  MCV 81.2 81.5  PLT 467* 413*   CBG:  Recent Labs Lab 12/24/15 1214 12/24/15 1632 12/24/15 2132 12/25/15 0554 12/25/15 1125  GLUCAP 253* 319* 156* 249* 350*   Thyroid function studies:  Recent Labs  12/23/15 0420  TSH 18.432*   Microbiology No results found for this or any previous visit (from the past 240 hour(s)).   Medications:   . albuterol  2.5 mg Nebulization QID  . ALPRAZolam  0.5 mg Oral BID  . antiseptic oral rinse  7 mL Mouth Rinse BID  . ARIPiprazole  5 mg Oral Daily  . aspirin EC  81 mg Oral Daily  . bacitracin  Topical BID  . doxycycline  100 mg Oral Q12H  . enoxaparin (LOVENOX) injection  40 mg Subcutaneous Daily  . furosemide  80 mg Intravenous BID  . gabapentin  100 mg Oral TID  . hydrocortisone valerate cream   Topical BID  . insulin aspart  0-15 Units Subcutaneous TID WC  . insulin aspart  0-5 Units Subcutaneous QHS  . levothyroxine  125 mcg Oral QAC breakfast  . LORazepam      . methylPREDNISolone (SOLU-MEDROL) injection  80 mg Intravenous Q6H  . potassium chloride SA  20 mEq Oral Daily  . simvastatin  10 mg Oral q1800   Continuous Infusions:   Time spent: 35 minutes.  The patient is medically complex with multiple co-morbidities and is at high risk for clinical deterioration and requires high complexity decision making.    LOS: 2 days   RAMA,CHRISTINA  [facility] Pager [phone]. If unable to reach me by pager, please call my cell phone at [phone].  *Please refer to [email or web address], password [email or web address] to get  updated schedule on who will round on this patient, as hospitalists switch teams weekly. If 7PM-7AM, please contact night-coverage at [email or web address], password [email or web address] for any overnight needs.  12/25/2015, 3:14 PM

## 2015-12-25 NOTE — Consult Note (Signed)
PULMONARY / CRITICAL CARE MEDICINE   Name: [name] MRN: [id number] DOB: [birth year]    ADMISSION DATE:  12/22/2015 CONSULTATION DATE:  4/3  REFERRING MD:  Rama  CHIEF COMPLAINT:  Acute Hypoxic Respiratory Failure   HISTORY OF PRESENT ILLNESS:   55 y.o. female with a PMH of chronic diastolic CHF with moderate to severe mitral stenosis from rheumatic fever and a PMH of insulin-dependent diabetes who was admitted 12/22/15 with working dx of decompensated CHF in the setting of not being on diuretics. Upon initial evaluation, BNP was greater than 300 and chest x-ray show trace bilateral streaky opacities. The patient was doing fairly well with diuresis until 12/25/15 when she developed significant respiratory distress. Her O2 sats were in the 70s on 100% NRB and high flow [place]. She was intolerant of BiPAP and the critical care team was subsequently consulted for intubation. She was moved emergently to the ICU. She vomited large volume of emesis during intubation and subsequently had what appeared to be  significant aspiration event. PCCM assumed care.    PAST MEDICAL HISTORY :  She  has a past medical history of Hypertension; Anemia; Chronic back pain; Neuropathy (HCC); Hypercholesterolemia; Major depressive disorder, recurrent (HCC); Generalized anxiety disorder; Panic disorder with agoraphobia; Tobacco abuse; MI (myocardial infarction) (HCC) (2007); Type II diabetes mellitus (HCC); Heart murmur; Emphysema of lung (HCC); Pneumonia (1960's X 2); On home oxygen therapy; Thin blood (HCC); History of blood transfusion; GERD (gastroesophageal reflux disease); Arthritis; Bipolar disorder (HCC); Rheumatic fever; Mitral stenosis; COPD (chronic obstructive pulmonary disease) (HCC); Hypothyroidism; Chronic back pain; PONV (postoperative nausea and vomiting); Seizures (HCC); CHF (congestive heart failure) (HCC); Fracture of right humerus (04/13/2015); Left patella fracture (04/12/2015); Shoulder fracture, right  (04/12/2015); and Shortness of breath dyspnea.  PAST SURGICAL HISTORY: She  has past surgical history that includes Foot surgery (Bilateral); Back surgery (X 3); Shoulder surgery (Left); Patella fracture surgery (Left, ~ 1999); Colon surgery; Tonsillectomy and adenoidectomy; Inguinal hernia repair (Right); Knee surgery (Left, ~ 2011); ORIF ankle fracture (Left); Tubal ligation; and ORIF humerus fracture (Right, 10/12/2015).  Allergies  Allergen Reactions  . Trazodone And Nefazodone Anaphylaxis and Other (See Comments)    Shortness of breath.  . Celecoxib Nausea Only  . Ibuprofen Nausea Only    No current facility-administered medications on file prior to encounter.   Current Outpatient Prescriptions on File Prior to Encounter  Medication Sig  . albuterol (PROAIR HFA) 108 (90 BASE) MCG/ACT inhaler Inhale 2 puffs into the lungs every 6 (six) hours. scheduled  . ALPRAZolam (XANAX) 0.5 MG tablet Take 1 tablet (0.5 mg total) by mouth at bedtime as needed for anxiety. (Patient taking differently: Take 0.5 mg by mouth 2 (two) times daily. )  . ARIPiprazole (ABILIFY) 5 MG tablet Take 5 mg by mouth daily.  Marland Kitchen aspirin EC 81 MG tablet Take 1 tablet (81 mg total) by mouth daily.  . cetirizine (ZYRTEC) 10 MG tablet Take 10 mg by mouth daily.   . furosemide (LASIX) 40 MG tablet Take 1 tablet (40 mg total) by mouth daily.  Marland Kitchen gabapentin (NEURONTIN) 100 MG capsule Take 1 capsule (100 mg total) by mouth 3 (three) times daily.  Marland Kitchen glimepiride (AMARYL) 4 MG tablet Take 4 mg by mouth daily.  . insulin aspart (NOVOLOG FLEXPEN) 100 UNIT/ML FlexPen Inject 6-15 Units into the skin 3 (three) times daily as needed for high blood sugar (CBG >100). CBG 100-200 6-8 units, >200 15 units  . insulin glargine (LANTUS) 100 UNIT/ML injection  Inject 0.6 mLs (60 Units total) into the skin daily. (Patient taking differently: Inject 48 Units into the skin at bedtime. )  . levothyroxine (SYNTHROID, LEVOTHROID) 125 MCG tablet Take 1  tablet (125 mcg total) by mouth daily before breakfast.  . losartan (COZAAR) 100 MG tablet Take 1 tablet (100 mg total) by mouth daily.  . metoprolol tartrate (LOPRESSOR) 25 MG tablet Take 0.5 tablets (12.5 mg total) by mouth 2 (two) times daily.  Marland Kitchen omeprazole (PRILOSEC) 20 MG capsule Take 20 mg by mouth daily.  . potassium chloride SA (K-DUR,KLOR-CON) 20 MEQ tablet Take 1 tablet (20 mEq total) by mouth daily.  . simvastatin (ZOCOR) 10 MG tablet Take 10 mg by mouth daily.     FAMILY HISTORY:  Her indicated that her mother is deceased. She indicated that her father is deceased.   SOCIAL HISTORY: She  reports that she quit smoking about 3 years ago. Her smoking use included Cigarettes. She has a 20 pack-year smoking history. She has never used smokeless tobacco. She reports that she drinks alcohol. She reports that she uses illicit drugs ("Crack" cocaine).  REVIEW OF SYSTEMS:   Unable   SUBJECTIVE:  Acute distress prior to intubation; now heavily sedated   VITAL SIGNS: BP 146/69 mmHg  Pulse 126  Temp(Src) 98.3 F (36.8 C) (Oral)  Resp 20  Ht  (1.448 m)  Wt 221 lb 9.6 oz (100.517 kg)  BMI 47.94 kg/m2  SpO2 86%  HEMODYNAMICS:    VENTILATOR SETTINGS:    INTAKE / OUTPUT: I/O last 3 completed shifts: In: 1200 [P.O.:1200] Out: 2975 [Urine:2975]  PHYSICAL EXAMINATION: General:  Chronically ill appearing white female; currently in acute distress prior to extubation  Neuro:  Was very anxious prior to intubation. Moved all ext & was oriented X 3 HEENT:  Now orally intubated Cardiovascular:  Tachy rrr w/out MRG Lungs:  Diffuse rales and scattered rhonchi  Abdomen:  Soft, not tender. + bowel sounds  Musculoskeletal:  Equal st and bulk  Skin:  Bilateral excoriations and generalized erythema of the left lower leg   LABS:  BMET  Recent Labs Lab 12/22/15 2305 12/23/15 0420 12/24/15 0353 12/25/15 0612  NA 139  --  139 139  K 4.0  --  4.1 4.4  CL 100*  --  100* 99*   CO2 27  --  30 30  BUN 28*  --  26* 21*  CREATININE 1.27* 1.22* 1.08* 0.97  GLUCOSE 105*  --  197* 291*    Electrolytes  Recent Labs Lab 12/22/15 2305 12/23/15 0420 12/24/15 0353 12/25/15 0612  CALCIUM 9.1  --  8.8* 9.2  MG  --  1.9  --   --     CBC  Recent Labs Lab 12/22/15 2305 12/23/15 0420  WBC 9.4 7.6  HGB 9.0* 8.8*  HCT 30.7* 30.0*  PLT 467* 413*    Coag's No results for input(s): APTT, INR in the last 168 hours.  Sepsis Markers No results for input(s): LATICACIDVEN, PROCALCITON, O2SATVEN in the last 168 hours.  ABG  Recent Labs Lab 12/25/15 1055  PHART 7.364  PCO2ART 53.2*  PO2ART 67.2*    Liver Enzymes  Recent Labs Lab 12/22/15 2305  AST 22  ALT 13*  ALKPHOS 83  BILITOT 0.9  ALBUMIN 3.6    Cardiac Enzymes No results for input(s): TROPONINI, PROBNP in the last 168 hours.  Glucose  Recent Labs Lab 12/24/15 0708 12/24/15 1214 12/24/15 1632 12/24/15 2132 12/25/15 0554 12/25/15 1125  GLUCAP 182* 253* 319* 156* 249* 350*    Imaging Dg Chest Port 1 View  12/25/2015  CLINICAL DATA:  Shortness of breath. EXAM: PORTABLE CHEST 1 VIEW COMPARISON:  12/22/2015. FINDINGS: Cardiomegaly persists. Worsening aeration with interstitial and alveolar prominence bilaterally suggesting CHF. BILATERAL pneumonia less favored. Previous LEFT shoulder replacement, and cervical fusion. Incomplete healing of the RIGHT humeral fracture status post ORIF. IMPRESSION: Worsening aeration.  CHF is favored. Electronically Signed   By: Elsie StainJohn T Curnes M.D.   On: 12/25/2015 11:09  marked increase in diffuse bilateral airspace disease. ETT and CVL in good position. No PTX    STUDIES:  ECHO 4/3: Left ventricle: The cavity size was normal. Systolic function was normal. The estimated ejection fraction was in the range of 60% to 65%. Wall motion was normal; there were no regional wall motion abnormalities. Mitral stenosis prevents evaluation of LV diastolic  function.  CULTURES: Respiratory culture 4/2>>>   ANTIBIOTICS: Doxy 4/1>>4/3 vanc 4/3>>> Zosyn 4/3>>> SIGNIFICANT EVENTS:   LINES/TUBES: OETT 4/3>>> Left IJ CVL 4/3>>>  DISCUSSION: 55 year old female admitted w/ decompensated diastolic HF w/ pulmonary edema and AECOPD. She acutely decompensated on 4/3 and could not tolerate BIPAP or any mask at all. This was further complicated by sever anxiety. PCCM called. Upon arrival she was hypoxic (sats in 4270s), anxious and on 100% NRB. She was moved emergently to the ICU. She vomited large volume of emesis during intubation and subsequently had what appeared to be significant aspiration event. [name] now intubated, sedated, and we have placed a CVL. We will place her on ARDS protocol, NMB and sedation and for now hold off from diuresis for now in case she develops SIRS/sepsis response to the aspiration event. If BP stable over night begin diuresis.   ASSESSMENT / PLAN:  PULMONARY A: Acute Hypoxic Respiratory Failure in Setting of Decompensated diastolic heart failure w/ resultant Pulmonary Edema Probable aspiration PNA (vomited during intubation); at high risk for superimposed ALI Possible AECOPD R/o OSA P:   Full vent support  NMB protocol  Heavy sedation  Ideally would add aggressive diuresis but given aspiration event need to be sure no SIRS/Sepsis w/ resultant hypotension so hold for now F/u abg and CXR Re-assess daily for weaning   CARDIOVASCULAR A:  Acute on chronic diastolic dysfunction.  Pulmonary edema  P:  IV diuresis (on hold as mentioned above) Cycle CEs Cycle BNP Tele Place CVP   RENAL A:   CKD stage III P:   Avoid hypotension   GASTROINTESTINAL A:   Obesity  P:   PUD w/ PPI ICU nutritional consult Start tube feed protocol   HEMATOLOGIC A:   Anemia of chronic disease  P:  [place] heparin   INFECTIOUS A:   LLE cellulitis  Aspiration PNA  P:   See above   ENDOCRINE A:   DM,  uncontrolled Hypothyroidism  P:   ssi protocol  Cont synthroid VT  NEUROLOGIC A:   Anxiety  Chronic diabetic neuropathy  P:   RASS goal: -4  BIS goal <60 NMB w/ TOF goal 2/4 Versed and fent gtts    FAMILY  - Updates: pending   - Inter-disciplinary family meet or Palliative Care meeting due by: 4/10   Simonne MartinetPeter E Pierina Schuknecht ACNP-BC [facility] Pulmonary/Critical Care Pager # [phone] OR # [phone] if no answer   12/25/2015, 3:15 PM

## 2015-12-25 NOTE — Significant Event (Addendum)
Rapid Response Event Note  Overview: Time Called: 1150 Arrival Time: 1150 Event Type: Respiratory, Cardiac  Initial Focused Assessment: Patient in Respiratory Distress, increased work of breathing.  BP 149/83 ST ST 132  RR 32  O2 sats 87% on 4L [place]. Lung sounds with rhonchi up to her clavical. 3+ edema LE Patient states that she is very short of breath. PCXR with edema per results. 2 albuterol treatments and 40mg  lasix given IV prior to arrival  Interventions: 40mg  lasix given IV Attempted to placed patient on Oxygen mask, but she will not tolerate having anything on her face. Placed on 6l [place] Patient continues to labor. 1300 Patient mildly improved, lung sounds still with crackle but improved. O2 sats 87% on 6l [place], desat to 80% with any activity. Dr Rama at bedside to assess patient. Morphine given IV O2 changed to high flow Oxygen device.  Placed on 12l O2 sats 93% Foley catheter placed. 1330 Will continue to monitor  1450  Patient in acute respiratory failure, pale skin, poor respiratory effort, crackles throughout. O2 sats 69% on HF Oxygyen O2 increased to 15l and nrb mask held near face (pt intolerant of anything on her face) O2 sats 77% Dr Rama at bedside, consulted CCM.  Plan transfer to ICU for intubation Patient transferred to 2h08 via bed with O2 and heart monitor. Intubated and central line placed emergently.  Event Summary: Name of Physician Notified: Rama at 1150  Name of Consulting Physician Notified: CCM/ Pete at bedside at    Outcome: Transferred (Comment) (2h08)  Event End Time: 1730  [name], Jalien Weakland

## 2015-12-25 NOTE — Progress Notes (Addendum)
Ativan 2 mgs 1 amp . discaded with Nellie Buck hydrocodone 2 tabs discarded wit nellie Buck in the sharp box. Spoken with pharmacist

## 2015-12-25 NOTE — Progress Notes (Signed)
Dr. Vassie LollAlva made aware that RT tried multiple attempts for A-line & ABG & all attempts were unsuccessful.

## 2015-12-25 NOTE — Progress Notes (Signed)
1000 anxious and apprehensive  Claiming unale to breath well . Respirayory tt by Respiratory therapist .Place a call to MD with orders . Seen and evaluated by  RRt  . extraa lasix given foley cath placed for for I and o measurement.abg dond. MD aware  .1130 placed on high flow 02 by respiratory therapist 02 sat  fluctuating with sob .Marland Kitchen. Seen by MD with orders ativan withholdFor transfer to icu and possible intubation. Seen by PCCM  Pa

## 2015-12-25 NOTE — Progress Notes (Signed)
eLink Physician-Brief Progress Note Patient Name: [name] DOB: 1961/06/23 MRN: [id number]   Date of Service  12/25/2015  HPI/Events of Note    eICU Interventions  protonix     Intervention Category Intermediate Interventions: Best-practice therapies (e.g. DVT, beta blocker, etc.)  Chelbie Jarnagin V. 12/25/2015, 6:12 PM

## 2015-12-26 ENCOUNTER — Inpatient Hospital Stay (HOSPITAL_COMMUNITY): Payer: Medicaid Other

## 2015-12-26 DIAGNOSIS — J690 Pneumonitis due to inhalation of food and vomit: Secondary | ICD-10-CM

## 2015-12-26 DIAGNOSIS — J9600 Acute respiratory failure, unspecified whether with hypoxia or hypercapnia: Secondary | ICD-10-CM

## 2015-12-26 DIAGNOSIS — L03116 Cellulitis of left lower limb: Secondary | ICD-10-CM

## 2015-12-26 DIAGNOSIS — J80 Acute respiratory distress syndrome: Secondary | ICD-10-CM

## 2015-12-26 DIAGNOSIS — I5033 Acute on chronic diastolic (congestive) heart failure: Secondary | ICD-10-CM

## 2015-12-26 DIAGNOSIS — J441 Chronic obstructive pulmonary disease with (acute) exacerbation: Secondary | ICD-10-CM

## 2015-12-26 DIAGNOSIS — I050 Rheumatic mitral stenosis: Secondary | ICD-10-CM

## 2015-12-26 LAB — BASIC METABOLIC PANEL
ANION GAP: 12 (ref 5–15)
Anion gap: 10 (ref 5–15)
BUN: 20 mg/dL (ref 6–20)
BUN: 19 mg/dL (ref 6–20)
CHLORIDE: 96 mmol/L — AB (ref 101–111)
CHLORIDE: 98 mmol/L — AB (ref 101–111)
CO2: 30 mmol/L (ref 22–32)
CO2: 30 mmol/L (ref 22–32)
CREATININE: 0.87 mg/dL (ref 0.44–1.00)
Calcium: 9 mg/dL (ref 8.9–10.3)
Calcium: 9.2 mg/dL (ref 8.9–10.3)
Creatinine, Ser: 0.87 mg/dL (ref 0.44–1.00)
GFR calc Af Amer: >60 mL/min (ref >60)
GFR calc Af Amer: >60 mL/min (ref >60)
GFR calc non Af Amer: >60 mL/min (ref >60)
Glucose, Bld: 346 mg/dL — ABNORMAL HIGH (ref 65–99)
Glucose, Bld: 319 mg/dL — ABNORMAL HIGH (ref 65–99)
Potassium: 4 mmol/L (ref 3.5–5.1)
Potassium: 3.8 mmol/L (ref 3.5–5.1)
SODIUM: 136 mmol/L (ref 135–145)
Sodium: 140 mmol/L (ref 135–145)

## 2015-12-26 LAB — POCT I-STAT 3, ART BLOOD GAS (G3+)
ACID-BASE EXCESS: 6 mmol/L — AB (ref 0.0–2.0)
BICARBONATE: 32.4 meq/L — AB (ref 20.0–24.0)
O2 Saturation: 96 %
TCO2: 34 mmol/L (ref 0–100)
pCO2 arterial: 52 mmHg — ABNORMAL HIGH (ref 35.0–45.0)
pH, Arterial: 7.4 (ref 7.350–7.450)
pO2, Arterial: 82 mmHg (ref 80.0–100.0)

## 2015-12-26 LAB — GLUCOSE, CAPILLARY
GLUCOSE-CAPILLARY: 298 mg/dL — AB (ref 65–99)
GLUCOSE-CAPILLARY: 331 mg/dL — AB (ref 65–99)
GLUCOSE-CAPILLARY: 335 mg/dL — AB (ref 65–99)
GLUCOSE-CAPILLARY: 339 mg/dL — AB (ref 65–99)
GLUCOSE-CAPILLARY: 384 mg/dL — AB (ref 65–99)
Glucose-Capillary: 270 mg/dL — ABNORMAL HIGH (ref 65–99)
Glucose-Capillary: 275 mg/dL — ABNORMAL HIGH (ref 65–99)

## 2015-12-26 LAB — BLOOD GAS, ARTERIAL
ACID-BASE EXCESS: 6.1 mmol/L — AB (ref 0.0–2.0)
Bicarbonate: 31.3 mEq/L — ABNORMAL HIGH (ref 20.0–24.0)
DRAWN BY: 398661
FIO2: 0.9
O2 Saturation: 99.5 %
PEEP: 14 cmH2O
Patient temperature: 98.6
RATE: 24 resp/min
TCO2: 33 mmol/L (ref 0–100)
VT: 270 mL
pCO2 arterial: 55.5 mmHg — ABNORMAL HIGH (ref 35.0–45.0)
pH, Arterial: 7.369 (ref 7.350–7.450)
pO2, Arterial: 158 mmHg — ABNORMAL HIGH (ref 80.0–100.0)

## 2015-12-26 LAB — INFLUENZA PANEL BY PCR (TYPE A & B)
H1N1FLUPCR: NOT DETECTED
INFLAPCR: NEGATIVE
INFLBPCR: NEGATIVE

## 2015-12-26 LAB — ECHOCARDIOGRAM COMPLETE
HEIGHTINCHES: 60 in
WEIGHTICAEL: 3721.36 [oz_av]

## 2015-12-26 LAB — PROCALCITONIN: Procalcitonin: 1.12 ng/mL

## 2015-12-26 MED ORDER — VITAL HIGH PROTEIN PO LIQD: 1000.0000 mL | ORAL | Status: DC

## 2015-12-26 MED ORDER — PRO-STAT SUGAR FREE PO LIQD
30.0000 mL | Freq: Three times a day (TID) | ORAL | Status: DC
  Administered 2015-12-26 – 2016-01-04 (×29): 30 mL
  Filled 2015-12-26 (×29): qty 30

## 2015-12-26 MED ORDER — ADULT MULTIVITAMIN LIQUID CH
15.0000 mL | Freq: Every day | ORAL | Status: DC
  Administered 2015-12-26 – 2016-01-04 (×10): 15 mL
  Filled 2015-12-26 (×11): qty 15

## 2015-12-26 MED ORDER — FUROSEMIDE 10 MG/ML IJ SOLN
40.0000 mg | Freq: Once | INTRAMUSCULAR | Status: AC
  Administered 2015-12-26: 40 mg via INTRAVENOUS
  Filled 2015-12-26: qty 4

## 2015-12-26 MED ORDER — METHYLPREDNISOLONE SODIUM SUCC 40 MG IJ SOLR
40.0000 mg | Freq: Three times a day (TID) | INTRAMUSCULAR | Status: DC
  Administered 2015-12-26 – 2015-12-27 (×3): 40 mg via INTRAVENOUS
  Filled 2015-12-26 (×3): qty 1

## 2015-12-26 MED ORDER — IPRATROPIUM-ALBUTEROL 0.5-2.5 (3) MG/3ML IN SOLN
3.0000 mL | Freq: Four times a day (QID) | RESPIRATORY_TRACT | Status: DC
Start: 2015-12-26 — End: 2016-01-04
  Administered 2015-12-26 – 2016-01-04 (×37): 3 mL via RESPIRATORY_TRACT
  Filled 2015-12-26 (×37): qty 3

## 2015-12-26 MED ORDER — ACETAMINOPHEN 160 MG/5ML PO SOLN: 650.0000 mg | Freq: Four times a day (QID) | ORAL | Status: DC | PRN

## 2015-12-26 MED ORDER — PANTOPRAZOLE SODIUM 40 MG PO PACK
40.0000 mg | PACK | ORAL | Status: DC
  Administered 2015-12-26 – 2016-01-04 (×10): 40 mg
  Filled 2015-12-26 (×10): qty 20

## 2015-12-26 MED ORDER — INSULIN GLARGINE 100 UNIT/ML ~~LOC~~ SOLN
20.0000 [IU] | SUBCUTANEOUS | Status: DC
  Filled 2015-12-26: qty 0.2

## 2015-12-26 MED ORDER — VITAL HIGH PROTEIN PO LIQD
1000.0000 mL | ORAL | Status: DC
  Administered 2015-12-26 – 2015-12-30 (×7): 1000 mL
  Administered 2015-12-31: 07:00:00
  Administered 2015-12-31 – 2016-01-04 (×5): 1000 mL

## 2015-12-26 MED ORDER — ALBUTEROL SULFATE (2.5 MG/3ML) 0.083% IN NEBU: 2.5000 mg | INHALATION_SOLUTION | RESPIRATORY_TRACT | Status: DC | PRN

## 2015-12-26 NOTE — Consult Note (Signed)
PULMONARY / CRITICAL CARE MEDICINE   Name: [name] MRN: [id number] DOB: 1960/12/06    ADMISSION DATE:  12/22/2015 CONSULTATION DATE:  12/25/2015  REFERRING MD:  Rama  CHIEF COMPLAINT:  Short of breath  SUBJECTIVE:   VITAL SIGNS: BP 157/73 mmHg  Pulse 93  Temp(Src) 98.7 F (37.1 C) (Oral)  Resp 32  Ht 5' (1.524 m)  Wt 232 lb 9.4 oz (105.5 kg)  BMI 45.42 kg/m2  SpO2 100%  HEMODYNAMICS: CVP:  [10 mmHg-18 mmHg] 13 mmHg  VENTILATOR SETTINGS: Vent Mode:  [-] PRVC FiO2 (%):  [70 %-100 %] 70 % Set Rate:  [16 bmp-32 bmp] 32 bmp Vt Set:  [270 mL-360 mL] 270 mL PEEP:  [10 cmH20-14 cmH20] 14 cmH20 Plateau Pressure:  [30 cmH20-41 cmH20] 30 cmH20  INTAKE / OUTPUT: I/O last 3 completed shifts: In: 1744 [P.O.:580; I.V.:264; IV Piggyback:900] Out: 2200 [Urine:2200]  PHYSICAL EXAMINATION: General: paralyzed Neuro: RASS -4 HEENT: ETT in place Cardiac: regular, 3/6 SM Chest: b/l inspiratory squeaks and crackles, no wheeze Abd: soft, non tender Ext: 1+ edema Skin: crusted lesions on legs  LABS:  BMET  Recent Labs Lab 12/24/15 0353 12/25/15 0612 12/26/15 0416  NA 139 139 136  K 4.1 4.4 4.0  CL 100* 99* 96*  CO2 BUN 26* 21* 20  CREATININE 1.08* 0.97 0.87  GLUCOSE 197* 291* 346*    Electrolytes  Recent Labs Lab 12/23/15 0420 12/24/15 0353 12/25/15 0612 12/26/15 0416  CALCIUM  --  8.8* 9.2 9.0  MG 1.9  --   --   --     CBC  Recent Labs Lab 12/22/15 2305 12/23/15 0420  WBC 9.4 7.6  HGB 9.0* 8.8*  HCT 30.7* 30.0*  PLT 467* 413*    Coag's No results for input(s): APTT, INR in the last 168 hours.  Sepsis Markers No results for input(s): LATICACIDVEN, PROCALCITON, O2SATVEN in the last 168 hours.  ABG  Recent Labs Lab 12/25/15 1055 12/25/15 2040 12/26/15 0350  PHART 7.364 7.291* 7.369  PCO2ART 53.2* 66.4* 55.5*  PO2ART 67.2* 73.0* 158*    Liver Enzymes  Recent Labs Lab 12/22/15 2305  AST 22  ALT 13*  ALKPHOS 83   BILITOT 0.9  ALBUMIN 3.6    Cardiac Enzymes No results for input(s): TROPONINI, PROBNP in the last 168 hours.  Glucose  Recent Labs Lab 12/25/15 1125 12/25/15 1655 12/25/15 1941 12/25/15 2234 12/26/15 0022 12/26/15 0400  GLUCAP 350* 352* 354* 331* 335* 339*    Imaging Dg Chest Port 1 View  12/25/2015  CLINICAL DATA:  Hypoxia EXAM: PORTABLE CHEST 1 VIEW COMPARISON:  Study obtained earlier in the day FINDINGS: Endotracheal tube tip is 5.4 cm above the carina. Central catheter tip is in the superior cava. Nasogastric tube tip and side port are below the diaphragm. No pneumothorax. There is widespread alveolar opacity throughout both lungs. Heart is mildly enlarged with pulmonary venous hypertension. There is postoperative change in each proximal humerus as well as in the lower cervical spine. IMPRESSION: Tube and catheter positions as described without pneumothorax. Widespread alveolar opacity bilaterally with cardiomegaly. Suspect widespread alveolar edema with congestive heart failure. Widespread pneumonia or aspiration could present similarly. Atypical allergic type phenomenon and pulmonary hemorrhage are also differential considerations. There is increase in alveolar opacity compared to earlier in the day. Electronically Signed   By: Bretta Bang III M.D.   On: 12/25/2015 16:26   Dg Chest Port 1 View  12/25/2015  CLINICAL DATA:  Shortness of breath. EXAM: PORTABLE CHEST 1 VIEW COMPARISON:  12/22/2015. FINDINGS: Cardiomegaly persists. Worsening aeration with interstitial and alveolar prominence bilaterally suggesting CHF. BILATERAL pneumonia less favored. Previous LEFT shoulder replacement, and cervical fusion. Incomplete healing of the RIGHT humeral fracture status post ORIF. IMPRESSION: Worsening aeration.  CHF is favored. Electronically Signed   By: Elsie StainJohn T Curnes M.D.   On: 12/25/2015 11:09     STUDIES:   CULTURES: 4/03 Sputum >>  4/04 Influenza PCR >> negative 4/04  Respiratory viral panel >>   ANTIBIOTICS: 4/01 Doxycycline >> 4/03 4/03 Vancomycin >> 4/03 Zosyn >>  SIGNIFICANT EVENTS: 3/31 Admit 4/03 VDRF >> to ICU, ARDS protocol, start nimbex  LINES/TUBES: 4/03 ETT >> 4/03 Lt IJ CVL >>  4/03 Lt femoral aline >>   DISCUSSION: 55 yo female presented with dyspnea and b/l leg pain/swelling after stopping outpt lasix.  She was being tx for CHF and COPD exacerbations.  She developed worsening hypoxia with b/l pulmonary infiltrates 4/03 after having episode of emesis with concern for aspiration and ARDS.  She has hx of diastolic CHF, severe mitral stenosis from rheumatic heart disease, HTN, DM, COPD, CKD stage III, Hypothyroidism, Bipolar, Peripheral neuropathy.  ASSESSMENT / PLAN:  PULMONARY A: Acute hypoxic respiratory failure 2nd to acute pulmonary edema, AECOPD, and aspiration pneumonitis with ARDS. P:   Full vent support Wean PEEP/FiO2 per ARDS protocol Wean off solumedrol as tolerated  Scheduled BDs F/u CXR, ABG Continue nimbex through 4/05  CARDIOVASCULAR A:  Acute on chronic diastolic CHF. Rheumatic heart disease with severe mitral stenosis. Hx of HTN, HLD. P:  Lasix 40 mg IV x one 4/04 F/u Echo ASA, zocor Hold cozaar, lopressor  RENAL A:   CKD stage III. P:   Monitor renal fx, urine outpt  GASTROINTESTINAL A:   Nutrition. P:   Tube feeds while on vent Protonix for SUP  HEMATOLOGIC A:   Anemia of chronic disease  P:  F/u CBC Lovenox for DVT prevention  INFECTIOUS A:   Aspiration pneumonia. Lower extremity cellulitis. P:   Day 2 of vancomycin, zosyn F/u procalcitonin  ENDOCRINE A:   Hx of DM with steroid induced hyperglycemia. Hx of hypothyroidism. P:   SSI with lantus Hold outpt amaryl Continue synthroid  NEUROLOGIC A:   Acute metabolic encephalopathy. Hx of Bipolar, anxiety, peripheral neuropathy. P:   RASS goal: -4 while on nimbex Continue abilify Hold outpt xanax, neurontin for  now  CC time 42 minutes.  Coralyn HellingVineet Verlaine Embry, MD [facility] 12/26/2015, 8:55 AM Pager:  [phone] After 3pm call: [phone]

## 2015-12-26 NOTE — Progress Notes (Signed)
Inpatient Diabetes Program Recommendations  AACE/ADA: New Consensus Statement on Inpatient Glycemic Control (2015)  Target Ranges:  Prepandial:   less than 140 mg/dL      Peak postprandial:   less than 180 mg/dL (1-2 hours)      Critically ill patients:  140 - 180 mg/dL   Review of Glycemic Control  Inpatient Diabetes Program Recommendations:  Insulin - Basal: Increase Lantus to 1/2 home dose  Correction (SSI): Increase to resistant scale  Insulin - Meal Coverage: currently NPO Thank you  Piedad ClimesGina Del Wiseman BSN, RN,CDE Inpatient Diabetes Coordinator [phone] (team pager)

## 2015-12-26 NOTE — Progress Notes (Signed)
Dr. Craige CottaSood notified re no UOP since this am, order for lasix received. Will monitor.  1600 Dr Vassie LollAlva notified re barely any UOP post lasix, new order received and carried out. Dr. Vassie LollAlva also informed about pt's significant other requesting to speak with MD. Stated will speak with pt's significant other next time he calls.

## 2015-12-26 NOTE — Progress Notes (Signed)
  Echocardiogram 2D Echocardiogram has been performed.  Delcie RochENNINGTON, Khalon Cansler 12/26/2015, 10:11 AM

## 2015-12-26 NOTE — Progress Notes (Signed)
PT Cancellation Note  Patient Details Name: Elizabeth BuryMary J Breithaupt MRN: [id number] DOB: 03-09-1961   Cancelled Treatment:    Reason Eval/Treat Not Completed: Patient not medically ready . Pt now intubated and inappropriate for PT at this time. PT to re-assess pt on Friday 4/7.  Marcene BrawnChadwell, Mordechai Matuszak Marie 12/26/2015, 3:58 PM   Lewis ShockAshly Hajira Verhagen, PT, DPT Pager #: [phone] Office #: [phone]

## 2015-12-26 NOTE — Progress Notes (Addendum)
Initial Nutrition Assessment  DOCUMENTATION CODES:   Morbid obesity  INTERVENTION:   Initiate Vital High Protein @ 35 ml/hr via Cortrak.   30 ml Prostat TID.    Tube feeding regimen provides 1160 kcal, 118 grams of protein, and 702 ml of H2O.   NUTRITION DIAGNOSIS:   Inadequate oral intake related to inability to eat as evidenced by NPO status.  GOAL:   Provide needs based on ASPEN/SCCM guidelines  MONITOR:   Skin, Vent status, Labs, Weight trends, TF tolerance  REASON FOR ASSESSMENT:   Consult Enteral/tube feeding initiation and management  ASSESSMENT:   55 yo female presented with dyspnea and b/l leg pain/swelling after stopping outpt lasix. She was being tx for CHF and COPD exacerbations. She developed worsening hypoxia with b/l pulmonary infiltrates 4/03 after having episode of emesis with concern for aspiration and ARDS.  4/3 transferred to ICU, vent, ARDS protocol started on Nimbex 4/4 Cortrak placed post-pyloric - Baird Lyonsasey RN in room to place tube  MV: 8.8 L/min Temp (24hrs), Avg:97.7 F (36.5 C), Min:96.6 F (35.9 C), Max:98.7 F (37.1 C)  Medications reviewed and include: Nimbex Labs reviewed: CBG's: 296-339 Usual body weight unknown but may be closer to 214 lb (97 kg) Pt previously eating > 75% of her meals.  Discussed with RN Nutrition-Focused physical exam completed. Findings are no fat depletion, no muscle depletion, and mild edema.    Diet Order:    NPO  Skin:  Wound (see comment) (Bilateral lower extremity DM ulcers )  Last BM:  4/1  Height:   Ht Readings from Last 1 Encounters:  12/25/15 5' (1.524 m)    Weight:   Wt Readings from Last 1 Encounters:  12/26/15 232 lb 9.4 oz (105.5 kg)    Ideal Body Weight:     BMI:  Body mass index is 45.42 kg/(m^2).  Estimated Nutritional Needs:   Kcal:  9343713572  Protein:  >/= 113 grams  Fluid:  > 1.5 L/day  EDUCATION NEEDS:   No education needs identified at this time  Kendell BaneHeather Jourdyn Ferrin  RD, LDN, CNSC [phone] Pager [phone] After Hours Pager

## 2015-12-27 ENCOUNTER — Inpatient Hospital Stay (HOSPITAL_COMMUNITY): Payer: Medicaid Other

## 2015-12-27 DIAGNOSIS — J9622 Acute and chronic respiratory failure with hypercapnia: Secondary | ICD-10-CM

## 2015-12-27 DIAGNOSIS — J9621 Acute and chronic respiratory failure with hypoxia: Secondary | ICD-10-CM

## 2015-12-27 LAB — MAGNESIUM: Magnesium: 1.7 mg/dL (ref 1.7–2.4)

## 2015-12-27 LAB — GLUCOSE, CAPILLARY
GLUCOSE-CAPILLARY: 106 mg/dL — AB (ref 65–99)
GLUCOSE-CAPILLARY: 106 mg/dL — AB (ref 65–99)
GLUCOSE-CAPILLARY: 120 mg/dL — AB (ref 65–99)
GLUCOSE-CAPILLARY: 242 mg/dL — AB (ref 65–99)
GLUCOSE-CAPILLARY: 279 mg/dL — AB (ref 65–99)
GLUCOSE-CAPILLARY: 326 mg/dL — AB (ref 65–99)
GLUCOSE-CAPILLARY: 422 mg/dL — AB (ref 65–99)
GLUCOSE-CAPILLARY: 455 mg/dL — AB (ref 65–99)
Glucose-Capillary: 396 mg/dL — ABNORMAL HIGH (ref 65–99)
Glucose-Capillary: 426 mg/dL — ABNORMAL HIGH (ref 65–99)
Glucose-Capillary: 400 mg/dL — ABNORMAL HIGH (ref 65–99)
Glucose-Capillary: 387 mg/dL — ABNORMAL HIGH (ref 65–99)
Glucose-Capillary: 221 mg/dL — ABNORMAL HIGH (ref 65–99)
Glucose-Capillary: 228 mg/dL — ABNORMAL HIGH (ref 65–99)
Glucose-Capillary: 176 mg/dL — ABNORMAL HIGH (ref 65–99)
Glucose-Capillary: 129 mg/dL — ABNORMAL HIGH (ref 65–99)

## 2015-12-27 LAB — BLOOD GAS, ARTERIAL
ACID-BASE EXCESS: 8 mmol/L — AB (ref 0.0–2.0)
Bicarbonate: 32.8 mEq/L — ABNORMAL HIGH (ref 20.0–24.0)
DRAWN BY: 398661
FIO2: 0.5
LHR: 32 {breaths}/min
O2 SAT: 98 %
PATIENT TEMPERATURE: 98.6
PCO2 ART: 53.6 mmHg — AB (ref 35.0–45.0)
PEEP: 8 cmH2O
PH ART: 7.404 (ref 7.350–7.450)
PO2 ART: 105 mmHg — AB (ref 80.0–100.0)
TCO2: 34.4 mmol/L (ref 0–100)
VT: 270 mL

## 2015-12-27 LAB — CBC
HEMATOCRIT: 29.7 % — AB (ref 36.0–46.0)
HEMOGLOBIN: 8.7 g/dL — AB (ref 12.0–15.0)
MCH: 23.8 pg — AB (ref 26.0–34.0)
MCHC: 29.3 g/dL — AB (ref 30.0–36.0)
MCV: 81.4 fL (ref 78.0–100.0)
Platelets: 329 10*3/uL (ref 150–400)
RBC: 3.65 MIL/uL — AB (ref 3.87–5.11)
RDW: 16.1 % — ABNORMAL HIGH (ref 11.5–15.5)
WBC: 15.3 10*3/uL — ABNORMAL HIGH (ref 4.0–10.5)

## 2015-12-27 LAB — RESPIRATORY VIRUS PANEL
Adenovirus: NEGATIVE
INFLUENZA A: NEGATIVE
Influenza B: NEGATIVE
Metapneumovirus: NEGATIVE
PARAINFLUENZA 1 A: NEGATIVE
PARAINFLUENZA 2 A: NEGATIVE
Parainfluenza 3: NEGATIVE
RESPIRATORY SYNCYTIAL VIRUS B: NEGATIVE
Respiratory Syncytial Virus A: NEGATIVE
Rhinovirus: NEGATIVE

## 2015-12-27 LAB — COMPREHENSIVE METABOLIC PANEL
ALBUMIN: 2.8 g/dL — AB (ref 3.5–5.0)
ALT: 8 U/L — AB (ref 14–54)
AST: 11 U/L — AB (ref 15–41)
Alkaline Phosphatase: 65 U/L (ref 38–126)
Anion gap: 11 (ref 5–15)
BUN: 27 mg/dL — AB (ref 6–20)
CHLORIDE: 101 mmol/L (ref 101–111)
CO2: 30 mmol/L (ref 22–32)
Calcium: 9 mg/dL (ref 8.9–10.3)
Creatinine, Ser: 0.98 mg/dL (ref 0.44–1.00)
GFR calc Af Amer: >60 mL/min (ref >60)
GLUCOSE: 479 mg/dL — AB (ref 65–99)
Potassium: 3.8 mmol/L (ref 3.5–5.1)
SODIUM: 142 mmol/L (ref 135–145)
Total Bilirubin: 0.8 mg/dL (ref 0.3–1.2)
Total Protein: 5.7 g/dL — ABNORMAL LOW (ref 6.5–8.1)

## 2015-12-27 LAB — PHOSPHORUS: Phosphorus: 2.8 mg/dL (ref 2.5–4.6)

## 2015-12-27 LAB — TRIGLYCERIDES: TRIGLYCERIDES: 77 mg/dL (ref <150)

## 2015-12-27 LAB — PROCALCITONIN: Procalcitonin: 1.11 ng/mL

## 2015-12-27 MED ORDER — SODIUM CHLORIDE 0.9 % IV SOLN
INTRAVENOUS | Status: DC
  Administered 2015-12-27: 4 [IU]/h via INTRAVENOUS
  Administered 2015-12-28: 6.3 [IU]/h via INTRAVENOUS
  Filled 2015-12-27 (×2): qty 2.5

## 2015-12-27 MED ORDER — METHYLPREDNISOLONE SODIUM SUCC 40 MG IJ SOLR
40.0000 mg | Freq: Two times a day (BID) | INTRAMUSCULAR | Status: DC
  Administered 2015-12-27 – 2015-12-28 (×2): 40 mg via INTRAVENOUS
  Filled 2015-12-27 (×2): qty 1

## 2015-12-27 MED ORDER — NOREPINEPHRINE BITARTRATE 1 MG/ML IV SOLN
2.0000 ug/min | INTRAVENOUS | Status: DC
  Filled 2015-12-27: qty 4

## 2015-12-27 MED ORDER — SODIUM CHLORIDE 0.9 % IV SOLN
25.0000 ug/h | INTRAVENOUS | Status: DC
  Administered 2015-12-27: 250 ug/h via INTRAVENOUS
  Administered 2015-12-27: 150 ug/h via INTRAVENOUS
  Administered 2015-12-28: 300 ug/h via INTRAVENOUS
  Administered 2015-12-28 (×2): 350 ug/h via INTRAVENOUS
  Administered 2015-12-29: 225 ug/h via INTRAVENOUS
  Administered 2015-12-29 – 2015-12-30 (×3): 200 ug/h via INTRAVENOUS
  Administered 2015-12-31 – 2016-01-01 (×3): 300 ug/h via INTRAVENOUS
  Filled 2015-12-27 (×13): qty 50

## 2015-12-27 MED ORDER — FENTANYL BOLUS VIA INFUSION
50.0000 ug | INTRAVENOUS | Status: DC | PRN
  Administered 2015-12-28: 50 ug via INTRAVENOUS
  Filled 2015-12-27: qty 50

## 2015-12-27 MED ORDER — NOREPINEPHRINE BITARTRATE 1 MG/ML IV SOLN
2.0000 ug/min | INTRAVENOUS | Status: DC
  Administered 2015-12-27: 5 ug/min via INTRAVENOUS
  Administered 2015-12-29: 2 ug/min via INTRAVENOUS
  Filled 2015-12-27 (×3): qty 16

## 2015-12-27 MED ORDER — METOPROLOL TARTRATE 25 MG/10 ML ORAL SUSPENSION
12.5000 mg | Freq: Two times a day (BID) | ORAL | Status: DC
  Administered 2015-12-27 – 2016-01-01 (×10): 12.5 mg
  Administered 2016-01-02: 09:00:00
  Administered 2016-01-02 – 2016-01-04 (×5): 12.5 mg
  Filled 2015-12-27 (×17): qty 10

## 2015-12-27 MED ORDER — FUROSEMIDE 10 MG/ML IJ SOLN
40.0000 mg | Freq: Once | INTRAMUSCULAR | Status: AC
  Administered 2015-12-27: 40 mg via INTRAVENOUS
  Filled 2015-12-27: qty 4

## 2015-12-27 MED ORDER — ACETAMINOPHEN 160 MG/5ML PO SOLN
650.0000 mg | Freq: Four times a day (QID) | ORAL | Status: DC | PRN
  Administered 2016-01-05 – 2016-01-06 (×3): 650 mg via ORAL
  Filled 2015-12-27 (×3): qty 20.3

## 2015-12-27 MED ORDER — PROPOFOL 1000 MG/100ML IV EMUL
0.0000 ug/kg/min | INTRAVENOUS | Status: DC
  Administered 2015-12-27: 5 ug/kg/min via INTRAVENOUS
  Administered 2015-12-27: 30 ug/kg/min via INTRAVENOUS
  Administered 2015-12-28: 40 ug/kg/min via INTRAVENOUS
  Administered 2015-12-28: 30 ug/kg/min via INTRAVENOUS
  Administered 2015-12-28: 40 ug/kg/min via INTRAVENOUS
  Administered 2015-12-28: 30 ug/kg/min via INTRAVENOUS
  Administered 2015-12-28: 24 ug/kg/min via INTRAVENOUS
  Administered 2015-12-29: 25 ug/kg/min via INTRAVENOUS
  Administered 2015-12-29: 40 ug/kg/min via INTRAVENOUS
  Administered 2015-12-29: 20 ug/kg/min via INTRAVENOUS
  Administered 2015-12-29: 45 ug/kg/min via INTRAVENOUS
  Administered 2015-12-29: 25 ug/kg/min via INTRAVENOUS
  Administered 2015-12-30 (×2): 35 ug/kg/min via INTRAVENOUS
  Administered 2015-12-30: 30 ug/kg/min via INTRAVENOUS
  Administered 2015-12-31 (×3): 35 ug/kg/min via INTRAVENOUS
  Administered 2015-12-31: 15 ug/kg/min via INTRAVENOUS
  Administered 2016-01-01 (×2): 40 ug/kg/min via INTRAVENOUS
  Filled 2015-12-27 (×26): qty 100

## 2015-12-27 MED ORDER — MIDAZOLAM HCL 2 MG/2ML IJ SOLN
2.0000 mg | INTRAMUSCULAR | Status: DC | PRN
  Administered 2016-01-03 (×2): 2 mg via INTRAVENOUS
  Filled 2015-12-27 (×3): qty 2

## 2015-12-27 NOTE — Progress Notes (Signed)
Dr. Craige CottaSood notified of pt lower blood pressures since off nimbex and change in sedation.  Will continue to monitor pt closely.

## 2015-12-27 NOTE — Progress Notes (Signed)
eLink Physician-Brief Progress Note Patient Name: Elizabeth BuryMary J [name] DOB: Jan 27, 1961 MRN: [id number]   Date of Service  12/27/2015  HPI/Events of Note  Rn called for concerns about pupils which were recorded as equal from prior shifts and now she has concern of slight unequal status by 1 mm Pt is on 2 sedation agents and paralysis  eICU Interventions  i camera in  Pupils are 2 mm equal, limited to no reaction as prior Peep 10, would have risk travel CT head and limited to gain at this stage Monitor pupils further       Intervention Category Intermediate Interventions: Change in mental status - evaluation and management  Tiffanee Mcnee J. 12/27/2015, 12:13 AM

## 2015-12-27 NOTE — Progress Notes (Addendum)
PULMONARY / CRITICAL CARE MEDICINE   Name: [name] MRN: [id number] DOB: 1961-07-22    ADMISSION DATE:  12/22/2015 CONSULTATION DATE:  12/25/2015  REFERRING MD:  Rama  CHIEF COMPLAINT:  Short of breath  SUBJECTIVE:  Remains on paralytic, heavy sedation.  VITAL SIGNS: BP 113/53 mmHg  Pulse 97  Temp(Src) 98.3 F (36.8 C) (Oral)  Resp 32  Ht 5' (1.524 m)  Wt 229 lb 0.9 oz (103.9 kg)  BMI 44.73 kg/m2  SpO2 97%  HEMODYNAMICS: CVP:  [12 mmHg-19 mmHg] 17 mmHg  VENTILATOR SETTINGS: Vent Mode:  [-] PRVC FiO2 (%):  [50 %-60 %] 50 % Set Rate:  [32 bmp] 32 bmp Vt Set:  [270 mL] 270 mL PEEP:  [8 cmH20-10 cmH20] 8 cmH20 Plateau Pressure:  [27 cmH20-30 cmH20] 30 cmH20  INTAKE / OUTPUT: I/O last 3 completed shifts: In: 2668.9 [I.V.:1207; NG/GT:461.9; IV Piggyback:1000] Out: 2575 [Urine:2575]  PHYSICAL EXAMINATION: General: paralyzed Neuro: RASS -4 HEENT: ETT in place, pupils 2 mm equal and reactive Cardiac: regular, 3/6 SM Chest: b/l inspiratory squeaks and crackles, no wheeze Abd: soft, non tender Ext: 1+ edema Skin: crusted lesions on legs  LABS:  BMET  Recent Labs Lab 12/26/15 0416 12/26/15 1651 12/27/15 0333  NA 136 140 142  K 4.0 3.8 3.8  CL 96* 98* 101  CO2 BUN 20 19 27*  CREATININE 0.87 0.87 0.98  GLUCOSE 346* 319* 479*    Electrolytes  Recent Labs Lab 12/23/15 0420  12/26/15 0416 12/26/15 1651 12/27/15 0333  CALCIUM  --   < > 9.0 9.2 9.0  MG 1.9  --   --   --  1.7  PHOS  --   --   --   --  2.8  < > = values in this interval not displayed.  CBC  Recent Labs Lab 12/22/15 2305 12/23/15 0420 12/27/15 0333  WBC 9.4 7.6 15.3*  HGB 9.0* 8.8* 8.7*  HCT 30.7* 30.0* 29.7*  PLT 467* 413* 329    Coag's No results for input(s): APTT, INR in the last 168 hours.  Sepsis Markers  Recent Labs Lab 12/26/15 1034 12/27/15 0333  PROCALCITON 1.12 1.11    ABG  Recent Labs Lab 12/26/15 0350 12/26/15 0920 12/27/15 0424   PHART 7.369 7.400 7.404  PCO2ART 55.5* 52.0* 53.6*  PO2ART 158* 82.0 105*    Liver Enzymes  Recent Labs Lab 12/22/15 2305 12/27/15 0333  AST 22 11*  ALT 13* 8*  ALKPHOS 83 65  BILITOT 0.9 0.8  ALBUMIN 3.6 2.8*    Cardiac Enzymes No results for input(s): TROPONINI, PROBNP in the last 168 hours.  Glucose  Recent Labs Lab 12/27/15 0314 12/27/15 0416 12/27/15 0519 12/27/15 0623 12/27/15 0726 12/27/15 0836  GLUCAP 396* 426* 400* 387* 326* 279*    Imaging Dg Chest Port 1 View  12/27/2015  CLINICAL DATA:  Respiratory failure. EXAM: PORTABLE CHEST 1 VIEW COMPARISON:  12/25/2015. FINDINGS: Interim placement of feeding tube. Tip below left hemidiaphragm. Endotracheal tube and left IJ line stable position. Cardiomegaly with diffuse bilateral pulmonary infiltrates and bilateral effusions consistent congestive heart failure. Slight interim clearing. No pneumothorax. Bilateral shoulder postsurgical change. Prior cervical spine fusion. IMPRESSION: 1. Interim placement of feeding tube, its tip is below left hemidiaphragm. Remaining lines and tubes in stable position. 2. Cardiomegaly with diffuse bilateral pulmonary infiltrates and bilateral effusions consistent with congestive heart failure. Slight interim clearing. Electronically Signed   By: Maisie Fus  Register   On: 12/27/2015  07:10   Dg Abd Portable 1v  12/26/2015  CLINICAL DATA:  NG tube placement. EXAM: PORTABLE ABDOMEN - 1 VIEW COMPARISON:  One-view abdomen from the same day. FINDINGS: The tip of a small bore feeding tube is now in the distal duodenum, at the ligament of Treitz. The bowel gas pattern is normal. Left basilar airspace disease process. IMPRESSION: 1. The tip of a small bore feeding tube is in the distal duodenum at the ligament of Treitz. 2. Persistent left lower lobe airspace disease. Electronically Signed   By: Marin Robertshristopher  Mattern M.D.   On: 12/26/2015 15:02   Dg Abd Portable 1v  12/26/2015  CLINICAL DATA:  Feeding tube  placement EXAM: PORTABLE ABDOMEN - 1 VIEW COMPARISON:  09/11/2013 FINDINGS: The tip of the feeding tube is in the gastric antrum. No disproportionate dilatation of bowel. No obvious free intraperitoneal gas. Patchy airspace disease in both lower lungs. IMPRESSION: Feeding tube tip is at the gastric antrum. Electronically Signed   By: Jolaine ClickArthur  Hoss M.D.   On: 12/26/2015 12:25     STUDIES:  4/04 Echo >> EF 60 to 65%, grade 2 diastolic CHF, severe MS  CULTURES: 4/03 Sputum >>  4/04 Influenza PCR >> negative 4/04 Respiratory viral panel >>   ANTIBIOTICS: 4/01 Doxycycline >> 4/03 4/03 Vancomycin >> 4/03 Zosyn >>  SIGNIFICANT EVENTS: 3/31 Admit 4/03 VDRF >> to ICU, ARDS protocol, start nimbex 4/05 d/c nimbex  LINES/TUBES: 4/03 ETT >> 4/03 Lt IJ CVL >>  4/03 Lt femoral aline >>   DISCUSSION: 55 yo female presented with dyspnea and b/l leg pain/swelling after stopping outpt lasix.  She was being tx for CHF and COPD exacerbations.  She developed worsening hypoxia with b/l pulmonary infiltrates 4/03 after having episode of emesis with concern for aspiration and ARDS.  She has hx of diastolic CHF, severe mitral stenosis from rheumatic heart disease, HTN, DM, COPD, CKD stage III, Hypothyroidism, Bipolar, Peripheral neuropathy.  ASSESSMENT / PLAN:  PULMONARY A: Acute hypoxic respiratory failure 2nd to acute pulmonary edema, AECOPD, and aspiration pneumonitis with ARDS. P:   Full vent support Wean PEEP/FiO2 per ARDS protocol Wean off solumedrol as tolerated  Scheduled BDs F/u CXR, ABG D/c nimbex 4/05  CARDIOVASCULAR A:  Acute on chronic diastolic CHF. Rheumatic heart disease with severe mitral stenosis. Hx of HTN, HLD. P:  Lasix 40 mg IV x one 4/05 Resume lopressor 4/05 ASA, zocor Hold cozaar  RENAL A:   CKD stage III. P:   Monitor renal fx, urine outpt  GASTROINTESTINAL A:   Nutrition. P:   Tube feeds while on vent Protonix for SUP  HEMATOLOGIC A:   Anemia of  chronic disease  P:  F/u CBC Lovenox for DVT prevention  INFECTIOUS A:   Aspiration pneumonia. Lower extremity cellulitis. P:   Day 3 of vancomycin, zosyn F/u procalcitonin  ENDOCRINE A:   Hx of DM with steroid induced hyperglycemia. Hx of hypothyroidism. P:   Insulin gtt Hold outpt amaryl Continue synthroid  NEUROLOGIC A:   Acute metabolic encephalopathy. Hx of Bipolar, anxiety, peripheral neuropathy. P:   RASS goal: -1 to -2 once off nimbex Change to diprivan, fentanyl gtt with prn versed once off nimbex Continue abilify Hold outpt xanax, neurontin for now  CC time 38 minutes.  Coralyn HellingVineet Wessley Emert, MD [facility] Pulmonary/Critical Care 12/27/2015, 9:09 AM Pager:  [phone] After 3pm call: [phone]   Spoke with pt's husband/significant other.  Updated him on pt's status, and plan of therapy.  Addressed all his questions.  Coralyn Helling, MD [facility] Pulmonary/Critical Care 12/27/2015, 9:33 AM Pager:  [phone] After 3pm call: [phone]

## 2015-12-27 NOTE — Progress Notes (Signed)
Dr. Craige CottaSood notified of low blood pressures continuing, with patient sitting up in bed and trying to extubate self.  Orders received to start levophed.  Will continue to monitor pt closely.

## 2015-12-27 NOTE — Progress Notes (Signed)
30cc of 50cc bag of versed wasted in sink by two RNs, Lexi P and Florence CannerKristina M.

## 2015-12-28 ENCOUNTER — Inpatient Hospital Stay (HOSPITAL_COMMUNITY): Payer: Medicaid Other

## 2015-12-28 LAB — GLUCOSE, CAPILLARY
GLUCOSE-CAPILLARY: 140 mg/dL — AB (ref 65–99)
GLUCOSE-CAPILLARY: 152 mg/dL — AB (ref 65–99)
GLUCOSE-CAPILLARY: 165 mg/dL — AB (ref 65–99)
GLUCOSE-CAPILLARY: 166 mg/dL — AB (ref 65–99)
GLUCOSE-CAPILLARY: 172 mg/dL — AB (ref 65–99)
GLUCOSE-CAPILLARY: 159 mg/dL — AB (ref 65–99)
GLUCOSE-CAPILLARY: 154 mg/dL — AB (ref 65–99)
GLUCOSE-CAPILLARY: 197 mg/dL — AB (ref 65–99)
GLUCOSE-CAPILLARY: 149 mg/dL — AB (ref 65–99)
GLUCOSE-CAPILLARY: 154 mg/dL — AB (ref 65–99)
GLUCOSE-CAPILLARY: 136 mg/dL — AB (ref 65–99)
GLUCOSE-CAPILLARY: 156 mg/dL — AB (ref 65–99)
GLUCOSE-CAPILLARY: 175 mg/dL — AB (ref 65–99)
Glucose-Capillary: 157 mg/dL — ABNORMAL HIGH (ref 65–99)
Glucose-Capillary: 161 mg/dL — ABNORMAL HIGH (ref 65–99)
Glucose-Capillary: 166 mg/dL — ABNORMAL HIGH (ref 65–99)
Glucose-Capillary: 188 mg/dL — ABNORMAL HIGH (ref 65–99)
Glucose-Capillary: 214 mg/dL — ABNORMAL HIGH (ref 65–99)
Glucose-Capillary: 126 mg/dL — ABNORMAL HIGH (ref 65–99)
Glucose-Capillary: 142 mg/dL — ABNORMAL HIGH (ref 65–99)
Glucose-Capillary: 143 mg/dL — ABNORMAL HIGH (ref 65–99)
Glucose-Capillary: 138 mg/dL — ABNORMAL HIGH (ref 65–99)
Glucose-Capillary: 163 mg/dL — ABNORMAL HIGH (ref 65–99)
Glucose-Capillary: 169 mg/dL — ABNORMAL HIGH (ref 65–99)
Glucose-Capillary: 184 mg/dL — ABNORMAL HIGH (ref 65–99)

## 2015-12-28 LAB — POCT I-STAT 3, ART BLOOD GAS (G3+)
Acid-Base Excess: 8 mmol/L — ABNORMAL HIGH (ref 0.0–2.0)
BICARBONATE: 35.1 meq/L — AB (ref 20.0–24.0)
O2 SAT: 96 %
PCO2 ART: 63.6 mmHg — AB (ref 35.0–45.0)
PO2 ART: 95 mmHg (ref 80.0–100.0)
Patient temperature: 99.7
TCO2: 37 mmol/L (ref 0–100)
pH, Arterial: 7.353 (ref 7.350–7.450)

## 2015-12-28 LAB — CULTURE, RESPIRATORY

## 2015-12-28 LAB — CBC
HEMATOCRIT: 32.1 % — AB (ref 36.0–46.0)
Hemoglobin: 9 g/dL — ABNORMAL LOW (ref 12.0–15.0)
MCH: 23.4 pg — ABNORMAL LOW (ref 26.0–34.0)
MCHC: 28 g/dL — AB (ref 30.0–36.0)
MCV: 83.4 fL (ref 78.0–100.0)
PLATELETS: 485 10*3/uL — AB (ref 150–400)
RBC: 3.85 MIL/uL — ABNORMAL LOW (ref 3.87–5.11)
RDW: 16.8 % — AB (ref 11.5–15.5)
WBC: 21.5 10*3/uL — AB (ref 4.0–10.5)

## 2015-12-28 LAB — PROCALCITONIN: PROCALCITONIN: 0.8 ng/mL

## 2015-12-28 LAB — BASIC METABOLIC PANEL
Anion gap: 11 (ref 5–15)
BUN: 47 mg/dL — AB (ref 6–20)
CALCIUM: 8.8 mg/dL — AB (ref 8.9–10.3)
CO2: 32 mmol/L (ref 22–32)
CREATININE: 1.15 mg/dL — AB (ref 0.44–1.00)
Chloride: 102 mmol/L (ref 101–111)
GFR calc Af Amer: >60 mL/min (ref >60)
GFR, EST NON AFRICAN AMERICAN: 52 mL/min — AB (ref >60)
GLUCOSE: 162 mg/dL — AB (ref 65–99)
Potassium: 4.2 mmol/L (ref 3.5–5.1)
SODIUM: 145 mmol/L (ref 135–145)

## 2015-12-28 LAB — CULTURE, RESPIRATORY W GRAM STAIN

## 2015-12-28 MED ORDER — METHYLPREDNISOLONE SODIUM SUCC 40 MG IJ SOLR
20.0000 mg | Freq: Two times a day (BID) | INTRAMUSCULAR | Status: DC
  Administered 2015-12-28 – 2015-12-29 (×2): 20 mg via INTRAVENOUS
  Filled 2015-12-28 (×2): qty 1

## 2015-12-28 NOTE — Progress Notes (Signed)
Pharmacy Antibiotic Note [name] is a 55 y.o. female admitted on 12/22/2015 with CHF that developed respiratory distress requiring intubation earlier today. Pharmacy has been consulted for Zosyn and Vancomycin dosing for coverage of pneumonia. Of note, she is on chronic doxycyline for pruritis and excoriations.  No fevers overnight, wbc continues to trend up (21) scr stable at 1.1.  Plan: 1. D/c Vancomycin  2. Zosyn 3.375g IV q8h (4 hour infusion).  3. Following along with you daily    Height: 5' (152.4 cm) Weight: 231 lb 14.8 oz (105.2 kg) IBW/kg (Calculated) : 45.5  Temp (24hrs), Avg:99.3 F (37.4 C), Min:98.2 F (36.8 C), Max:99.8 F (37.7 C)   Recent Labs Lab 12/22/15 2305 12/23/15 0420  12/25/15 0612 12/26/15 0416 12/26/15 1651 12/27/15 0333 12/28/15 0515  WBC 9.4 7.6  --   --   --   --  15.3* 21.5*  CREATININE 1.27* 1.22*  < > 0.97 0.87 0.87 0.98 1.15*  < > = values in this interval not displayed.  Estimated Creatinine Clearance: 59.8 mL/min (by C-G formula based on Cr of 1.15).    Allergies  Allergen Reactions  . Trazodone And Nefazodone Anaphylaxis and Other (See Comments)    Shortness of breath.  . Celecoxib Nausea Only  . Ibuprofen Nausea Only   Antimicrobials this admission:  4/3 Zosyn>> 4/3 Vancomycin >>4/6  PTA Doxycycline >>   Dose adjustments this admission: n/a  Microbiology results: 4/3 Sputum >>rare yeast 4/4 Influenza PCR >> negative 4/4 Respiratory viral panel >> neg  Sheppard CoilFrank Wilson PharmD., BCPS Clinical Pharmacist Pager [phone] 12/28/2015 3:23 PM

## 2015-12-28 NOTE — Progress Notes (Signed)
PULMONARY / CRITICAL CARE MEDICINE   Name: [name] MRN: [id number] DOB: 10/31/1960    ADMISSION DATE:  12/22/2015  SUBJECTIVE:  Shortness of breath  INTERVAL HISTORY:  Off of Nimbex since AM of 04/05.  Agitated during WUA, no purposeful movement.  Allergies  Allergen Reactions  . Trazodone And Nefazodone Anaphylaxis and Other (See Comments)    Shortness of breath.  . Celecoxib Nausea Only  . Ibuprofen Nausea Only    No current facility-administered medications on file prior to encounter.   Current Outpatient Prescriptions on File Prior to Encounter  Medication Sig  . albuterol (PROAIR HFA) 108 (90 BASE) MCG/ACT inhaler Inhale 2 puffs into the lungs every 6 (six) hours. scheduled  . ALPRAZolam (XANAX) 0.5 MG tablet Take 1 tablet (0.5 mg total) by mouth at bedtime as needed for anxiety. (Patient taking differently: Take 0.5 mg by mouth 2 (two) times daily. )  . ARIPiprazole (ABILIFY) 5 MG tablet Take 5 mg by mouth daily.  Marland Kitchen aspirin EC 81 MG tablet Take 1 tablet (81 mg total) by mouth daily.  . cetirizine (ZYRTEC) 10 MG tablet Take 10 mg by mouth daily.   . furosemide (LASIX) 40 MG tablet Take 1 tablet (40 mg total) by mouth daily.  Marland Kitchen gabapentin (NEURONTIN) 100 MG capsule Take 1 capsule (100 mg total) by mouth 3 (three) times daily.  Marland Kitchen glimepiride (AMARYL) 4 MG tablet Take 4 mg by mouth daily.  . insulin aspart (NOVOLOG FLEXPEN) 100 UNIT/ML FlexPen Inject 6-15 Units into the skin 3 (three) times daily as needed for high blood sugar (CBG >100). CBG 100-200 6-8 units, >200 15 units  . insulin glargine (LANTUS) 100 UNIT/ML injection Inject 0.6 mLs (60 Units total) into the skin daily. (Patient taking differently: Inject 48 Units into the skin at bedtime. )  . levothyroxine (SYNTHROID, LEVOTHROID) 125 MCG tablet Take 1 tablet (125 mcg total) by mouth daily before breakfast.  . losartan (COZAAR) 100 MG tablet Take 1 tablet (100 mg total) by mouth daily.  . metoprolol tartrate  (LOPRESSOR) 25 MG tablet Take 0.5 tablets (12.5 mg total) by mouth 2 (two) times daily.  Marland Kitchen omeprazole (PRILOSEC) 20 MG capsule Take 20 mg by mouth daily.  . potassium chloride SA (K-DUR,KLOR-CON) 20 MEQ tablet Take 1 tablet (20 mEq total) by mouth daily.  . simvastatin (ZOCOR) 10 MG tablet Take 10 mg by mouth daily.    VITAL SIGNS: BP 102/54 mmHg  Pulse 73  Temp(Src) 99.7 F (37.6 C) (Axillary)  Resp 30  Ht 5' (1.524 m)  Wt 231 lb 14.8 oz (105.2 kg)  BMI 45.29 kg/m2  SpO2 97%  HEMODYNAMICS: CVP:  [18 mmHg-19 mmHg] 19 mmHg  VENTILATOR SETTINGS: Vent Mode:  [-] PRVC FiO2 (%):  [40 %-50 %] 40 % Set Rate:  [30 bmp] 30 bmp Vt Set:  [270 mL] 270 mL PEEP:  [8 cmH20] 8 cmH20 Plateau Pressure:  [20 cmH20-32 cmH20] 20 cmH20  INTAKE / OUTPUT: I/O last 3 completed shifts: In: 4376.5 [I.V.:1964.3; NG/GT:1662.3; IV Piggyback:750] Out: 2875 [Urine:2875]  PHYSICAL EXAMINATION: General: sedated HEENT: ETT, NG tube Cardiac: RRR, no rubs, murmurs or gallops Pulm: few rhonchi anteriorly, moving normal volumes of air Abd: soft, non-distended, BS present Ext: SCDs in place, warm and well perfused, multiple small healed scabs on right lower leg, 1+ B/L LE edema Neuro: sedate and intubated (RASS -4)  LABS:  BMET  Recent Labs Lab 12/26/15 1651 12/27/15 0333 12/28/15 0515  NA 140 142 145  K 3.8 3.8 4.2  CL 98* 101 102  CO2 30 30 32  BUN 19 27* 47*  CREATININE 0.87 0.98 1.15*  GLUCOSE 319* 479* 162*    Electrolytes  Recent Labs Lab 12/23/15 0420  12/26/15 1651 12/27/15 0333 12/28/15 0515  CALCIUM  --   < > 9.2 9.0 8.8*  MG 1.9  --   --  1.7  --   PHOS  --   --   --  2.8  --   < > = values in this interval not displayed.  CBC  Recent Labs Lab 12/23/15 0420 12/27/15 0333 12/28/15 0515  WBC 7.6 15.3* 21.5*  HGB 8.8* 8.7* 9.0*  HCT 30.0* 29.7* 32.1*  PLT 413* 329 485*   Sepsis Markers  Recent Labs Lab 12/26/15 1034 12/27/15 0333 12/28/15 0515  PROCALCITON  1.12 1.11 0.80    ABG  Recent Labs Lab 12/26/15 0920 12/27/15 0424 12/28/15 0227  PHART 7.400 7.404 7.353  PCO2ART 52.0* 53.6* 63.6*  PO2ART 82.0 105* 95.0    Glucose  Recent Labs Lab 12/28/15 0228 12/28/15 0331 12/28/15 0446 12/28/15 0550 12/28/15 0650 12/28/15 0750  GLUCAP 166* 172* 142* 143* 126* 159*    Imaging Dg Chest Port 1 View  12/28/2015  CLINICAL DATA:  Respiratory failure and shortness of breath EXAM: PORTABLE CHEST 1 VIEW COMPARISON:  12/27/2015 FINDINGS: Endotracheal tube tip is at the clavicular heads. A feeding tube at least enters the stomach. Left IJ central line with tip in the region of the upper cavoatrial junction, partially obscured by a EKG leads. Unchanged widespread lung opacification, combination of pleural fluid and airspace disease. Chronic cardiomegaly. No evidence of pneumothorax. IMPRESSION: 1. Unchanged positioning of tubes and central line. 2. Severe CHF and/or multi focal pneumonia, stable. Electronically Signed   By: Marnee Spring M.D.   On: 12/28/2015 07:22   STUDIES:  4/04 Echo >> EF 60 to 65%, grade 2 diastolic CHF, severe MS  CULTURES: 4/03 Sputum >> rare yeast c/w candida 4/04 Influenza PCR >> negative 4/04 Respiratory viral panel >> negative  ANTIBIOTICS: 4/01 Doxycycline >> 4/03 4/03 Vancomycin >> 04/06 4/03 Zosyn >>  SIGNIFICANT EVENTS: 3/31 Admit 4/03 VDRF >> to ICU, ARDS protocol, start nimbex 4/05 d/c nimbex  LINES/TUBES: 04/03 ETT 04/03 Left IJ  04/03 Left femoral A-line  DISCUSSION: 55 year old woman admitted on 03/31 with acute HF exacerbation 2/2 non-compliance with diuretic therapy admitted to ICU on 04/03 for acute hypoxic respiratory failure due to ARDS due to aspiration.  ASSESSMENT / PLAN:  PULMONARY A: VDRF - ARDS 2/2 aspiration AECOPD P:   Continue vent support with ARDS protocol. Continue solumedrol, decrease dose.  Continue scheduled duonebs. Daily SBT now off paralytic. AM  CXR  CARDIOVASCULAR A:  Acute on chronic diastolic HF - net neg only since admission.  Stable CXR. Severe MS 2/2 rheumatic heart diseease HTN HLD P:  Hold today's Lasix due to AKI.  Continue home BB.  Hold ACEI given low BP and AKI. Continue Levophed for pressure support. Strict I&Os Continue ASA and statin.  RENAL A:   AKI - slight bump in Cr today P:   Hold Lasix today.  Monitor BMP.  GASTROINTESTINAL A:   Nutrition  Risk for stress ulcer. P:   Continue TF while NPO. Continue PPI for stress ulcer ppx.  HEMATOLOGIC A:   Leukocytosis 2/2 to infection but likely increasing in the setting of steroids. Anemia of critical illness in the setting of AoCD - stable  Risk for VTE. P:  Decrease steroids as below. Monitor CBC. Continue [place] lovenox for VTE ppx.  INFECTIOUS A:   Aspiration PNA - neg MRSA and resp culture with yeast only PCT decreasing. P:   Continue zoxyn - day 4 D/c vanc Monitor WBC.  ENDOCRINE A:   DM type 2 - glucose elevated on steroids Hypothyroidism  P:   Continue ICU hyperglycemia protocol.   Decrease solumedrol from 40mg  BID to 20mg  BID. Continue levothyroxine.  NEUROLOGIC A:   Acute encephalopathy. No purposeful movement off paralytic and with sedation lightened. Bipolar disorder, anxiety Peripheral neuropathy P:   RASS goal: -1 to -2 Continue propofol and fentantyl, prn versed Continue Abilify, prn Versed Continue to hold outpatient Xanax (getting prn Versed) and neurontin  FAMILY  - Updates: Plan to update husband by phone today.    Evelena PeatAlex Necia Kamm, DO [facility] PGY3 [phone] 12/28/2015, 10:36 AM   Pulmonary and Critical Care Medicine [facility] Pager: [phone]

## 2015-12-29 ENCOUNTER — Inpatient Hospital Stay (HOSPITAL_COMMUNITY): Payer: Medicaid Other

## 2015-12-29 LAB — CBC WITH DIFFERENTIAL/PLATELET
Basophils Absolute: 0 10*3/uL (ref 0.0–0.1)
Basophils Relative: 0 %
EOS ABS: 0 10*3/uL (ref 0.0–0.7)
EOS PCT: 0 %
HCT: 31.5 % — ABNORMAL LOW (ref 36.0–46.0)
HEMOGLOBIN: 9 g/dL — AB (ref 12.0–15.0)
LYMPHS ABS: 1.2 10*3/uL (ref 0.7–4.0)
Lymphocytes Relative: 7 %
MCH: 24 pg — AB (ref 26.0–34.0)
MCHC: 28.6 g/dL — ABNORMAL LOW (ref 30.0–36.0)
MCV: 84 fL (ref 78.0–100.0)
MONOS PCT: 9 %
Monocytes Absolute: 1.4 10*3/uL — ABNORMAL HIGH (ref 0.1–1.0)
NEUTROS PCT: 84 %
Neutro Abs: 13.5 10*3/uL — ABNORMAL HIGH (ref 1.7–7.7)
PLATELETS: 334 10*3/uL (ref 150–400)
RBC: 3.75 MIL/uL — ABNORMAL LOW (ref 3.87–5.11)
RDW: 16.6 % — AB (ref 11.5–15.5)
WBC: 16 10*3/uL — ABNORMAL HIGH (ref 4.0–10.5)

## 2015-12-29 LAB — GLUCOSE, CAPILLARY
GLUCOSE-CAPILLARY: 172 mg/dL — AB (ref 65–99)
GLUCOSE-CAPILLARY: 179 mg/dL — AB (ref 65–99)
GLUCOSE-CAPILLARY: 161 mg/dL — AB (ref 65–99)
GLUCOSE-CAPILLARY: 162 mg/dL — AB (ref 65–99)
GLUCOSE-CAPILLARY: 148 mg/dL — AB (ref 65–99)
GLUCOSE-CAPILLARY: 247 mg/dL — AB (ref 65–99)
GLUCOSE-CAPILLARY: 221 mg/dL — AB (ref 65–99)
Glucose-Capillary: 131 mg/dL — ABNORMAL HIGH (ref 65–99)
Glucose-Capillary: 139 mg/dL — ABNORMAL HIGH (ref 65–99)
Glucose-Capillary: 147 mg/dL — ABNORMAL HIGH (ref 65–99)
Glucose-Capillary: 172 mg/dL — ABNORMAL HIGH (ref 65–99)
Glucose-Capillary: 176 mg/dL — ABNORMAL HIGH (ref 65–99)
Glucose-Capillary: 178 mg/dL — ABNORMAL HIGH (ref 65–99)
Glucose-Capillary: 194 mg/dL — ABNORMAL HIGH (ref 65–99)
Glucose-Capillary: 199 mg/dL — ABNORMAL HIGH (ref 65–99)
Glucose-Capillary: 275 mg/dL — ABNORMAL HIGH (ref 65–99)

## 2015-12-29 LAB — BASIC METABOLIC PANEL
Anion gap: 10 (ref 5–15)
BUN: 49 mg/dL — AB (ref 6–20)
CALCIUM: 8.7 mg/dL — AB (ref 8.9–10.3)
CO2: 31 mmol/L (ref 22–32)
CREATININE: 1 mg/dL (ref 0.44–1.00)
Chloride: 103 mmol/L (ref 101–111)
GFR calc Af Amer: >60 mL/min (ref >60)
GLUCOSE: 156 mg/dL — AB (ref 65–99)
Potassium: 3.7 mmol/L (ref 3.5–5.1)
Sodium: 144 mmol/L (ref 135–145)

## 2015-12-29 LAB — C DIFFICILE QUICK SCREEN W PCR REFLEX
C Diff antigen: NEGATIVE
C Diff interpretation: NEGATIVE
C Diff toxin: NEGATIVE

## 2015-12-29 MED ORDER — INSULIN GLARGINE 100 UNIT/ML ~~LOC~~ SOLN
25.0000 [IU] | SUBCUTANEOUS | Status: DC
  Administered 2015-12-29: 25 [IU] via SUBCUTANEOUS
  Filled 2015-12-29 (×2): qty 0.25

## 2015-12-29 MED ORDER — INSULIN GLARGINE 100 UNIT/ML ~~LOC~~ SOLN
25.0000 [IU] | Freq: Every day | SUBCUTANEOUS | Status: DC
  Administered 2015-12-29 – 2015-12-31 (×3): 25 [IU] via SUBCUTANEOUS
  Filled 2015-12-29 (×4): qty 0.25

## 2015-12-29 MED ORDER — INSULIN ASPART 100 UNIT/ML ~~LOC~~ SOLN
4.0000 [IU] | SUBCUTANEOUS | Status: DC
  Administered 2015-12-29 (×2): 4 [IU] via SUBCUTANEOUS

## 2015-12-29 MED ORDER — INSULIN ASPART 100 UNIT/ML ~~LOC~~ SOLN
0.0000 [IU] | SUBCUTANEOUS | Status: DC
  Administered 2015-12-29: 7 [IU] via SUBCUTANEOUS
  Administered 2015-12-29: 4 [IU] via SUBCUTANEOUS
  Administered 2015-12-29: 11 [IU] via SUBCUTANEOUS
  Administered 2015-12-30: 3 [IU] via SUBCUTANEOUS
  Administered 2015-12-30: 7 [IU] via SUBCUTANEOUS
  Administered 2015-12-30 – 2015-12-31 (×2): 4 [IU] via SUBCUTANEOUS
  Administered 2015-12-31: 11 [IU] via SUBCUTANEOUS
  Administered 2015-12-31: 7 [IU] via SUBCUTANEOUS
  Administered 2015-12-31: 3 [IU] via SUBCUTANEOUS
  Administered 2015-12-31: 2 [IU] via SUBCUTANEOUS
  Administered 2016-01-01: 11 [IU] via SUBCUTANEOUS
  Administered 2016-01-01 (×2): 4 [IU] via SUBCUTANEOUS
  Administered 2016-01-01 (×2): 7 [IU] via SUBCUTANEOUS
  Administered 2016-01-02 (×4): 15 [IU] via SUBCUTANEOUS
  Administered 2016-01-02 – 2016-01-03 (×2): 7 [IU] via SUBCUTANEOUS
  Administered 2016-01-03: 11 [IU] via SUBCUTANEOUS
  Administered 2016-01-03: 3 [IU] via SUBCUTANEOUS
  Administered 2016-01-03 (×2): 4 [IU] via SUBCUTANEOUS
  Administered 2016-01-04: 3 [IU] via SUBCUTANEOUS
  Administered 2016-01-04: 11 [IU] via SUBCUTANEOUS
  Administered 2016-01-04: 3 [IU] via SUBCUTANEOUS
  Administered 2016-01-04: 7 [IU] via SUBCUTANEOUS
  Administered 2016-01-04: 3 [IU] via SUBCUTANEOUS
  Administered 2016-01-04: 15 [IU] via SUBCUTANEOUS
  Administered 2016-01-04: 11 [IU] via SUBCUTANEOUS
  Administered 2016-01-05: 3 [IU] via SUBCUTANEOUS

## 2015-12-29 MED ORDER — INSULIN ASPART 100 UNIT/ML ~~LOC~~ SOLN
2.0000 [IU] | SUBCUTANEOUS | Status: DC
  Administered 2015-12-29: 6 [IU] via SUBCUTANEOUS

## 2015-12-29 MED ORDER — DEXTROSE 10 % IV SOLN: INTRAVENOUS | Status: DC | PRN

## 2015-12-29 MED ORDER — PREDNISONE 5 MG/5ML PO SOLN
10.0000 mg | Freq: Every day | ORAL | Status: DC
  Administered 2015-12-30 – 2016-01-05 (×7): 10 mg
  Filled 2015-12-29 (×7): qty 10

## 2015-12-29 MED ORDER — FUROSEMIDE 10 MG/ML IJ SOLN
40.0000 mg | Freq: Once | INTRAMUSCULAR | Status: AC
  Administered 2015-12-29: 40 mg via INTRAVENOUS
  Filled 2015-12-29: qty 4

## 2015-12-29 NOTE — Progress Notes (Signed)
eLink Physician-Brief Progress Note Patient Name: [name] DOB: 1960/12/06 MRN: [id number]   Date of Service  12/29/2015  HPI/Events of Note  Patient with loose watery stools.  Has elevated WBC and low grade fever.  On TFs.  No laxatives.  eICU Interventions  C Diff ordered Will hold on flexiseal for now     Intervention Category Minor Interventions: Clinical assessment - ordering diagnostic tests  Francyne Arreaga 12/29/2015, 12:03 AM

## 2015-12-29 NOTE — Progress Notes (Signed)
eLink Physician-Brief Progress Note Patient Name: Elizabeth BuryMary J [name] DOB: [birth year] MRN: [id number]   Date of Service  12/29/2015  HPI/Events of Note  C Diff negative.  Continues to have loose stools with potential for skin breakdown and line contamination.  eICU Interventions  Plan: Insert rectal tube     Intervention Category Intermediate Interventions: Other:  Bassheva Flury 12/29/2015, 2:11 AM

## 2015-12-29 NOTE — Progress Notes (Signed)
PULMONARY / CRITICAL CARE MEDICINE   Name: [name] MRN: [id number] DOB: 1961/05/21    ADMISSION DATE:  12/22/2015  SUBJECTIVE:  Shortness of breath  INTERVAL HISTORY:  Loose, watery stools overnight so cdiff testing ordered and was negative.  Rectal tube inserted for skin protection.  Allergies  Allergen Reactions  . Trazodone And Nefazodone Anaphylaxis and Other (See Comments)    Shortness of breath.  . Celecoxib Nausea Only  . Ibuprofen Nausea Only    No current facility-administered medications on file prior to encounter.   Current Outpatient Prescriptions on File Prior to Encounter  Medication Sig  . albuterol (PROAIR HFA) 108 (90 BASE) MCG/ACT inhaler Inhale 2 puffs into the lungs every 6 (six) hours. scheduled  . ALPRAZolam (XANAX) 0.5 MG tablet Take 1 tablet (0.5 mg total) by mouth at bedtime as needed for anxiety. (Patient taking differently: Take 0.5 mg by mouth 2 (two) times daily. )  . ARIPiprazole (ABILIFY) 5 MG tablet Take 5 mg by mouth daily.  Marland Kitchen. aspirin EC 81 MG tablet Take 1 tablet (81 mg total) by mouth daily.  . cetirizine (ZYRTEC) 10 MG tablet Take 10 mg by mouth daily.   . furosemide (LASIX) 40 MG tablet Take 1 tablet (40 mg total) by mouth daily.  Marland Kitchen. gabapentin (NEURONTIN) 100 MG capsule Take 1 capsule (100 mg total) by mouth 3 (three) times daily.  Marland Kitchen. glimepiride (AMARYL) 4 MG tablet Take 4 mg by mouth daily.  . insulin aspart (NOVOLOG FLEXPEN) 100 UNIT/ML FlexPen Inject 6-15 Units into the skin 3 (three) times daily as needed for high blood sugar (CBG >100). CBG 100-200 6-8 units, >200 15 units  . insulin glargine (LANTUS) 100 UNIT/ML injection Inject 0.6 mLs (60 Units total) into the skin daily. (Patient taking differently: Inject 48 Units into the skin at bedtime. )  . levothyroxine (SYNTHROID, LEVOTHROID) 125 MCG tablet Take 1 tablet (125 mcg total) by mouth daily before breakfast.  . losartan (COZAAR) 100 MG tablet Take 1 tablet (100 mg total) by mouth  daily.  . metoprolol tartrate (LOPRESSOR) 25 MG tablet Take 0.5 tablets (12.5 mg total) by mouth 2 (two) times daily.  Marland Kitchen. omeprazole (PRILOSEC) 20 MG capsule Take 20 mg by mouth daily.  . potassium chloride SA (K-DUR,KLOR-CON) 20 MEQ tablet Take 1 tablet (20 mEq total) by mouth daily.  . simvastatin (ZOCOR) 10 MG tablet Take 10 mg by mouth daily.    VITAL SIGNS: BP 135/69 mmHg  Pulse 63  Temp(Src) 98.3 F (36.8 C) (Axillary)  Resp 30  Ht 5' (1.524 m)  Wt 235 lb 3.7 oz (106.7 kg)  BMI 45.94 kg/m2  SpO2 100%  HEMODYNAMICS:    VENTILATOR SETTINGS: Vent Mode:  [-] PRVC FiO2 (%):  [40 %-100 %] 100 % Set Rate:  [30 bmp] 30 bmp Vt Set:  [270 mL] 270 mL PEEP:  [8 cmH20-12 cmH20] 12 cmH20 Plateau Pressure:  [24 cmH20-29 cmH20] 29 cmH20  INTAKE / OUTPUT: I/O last 3 completed shifts: In: 4387.6 [I.V.:2132.6; NG/GT:1755; IV Piggyback:500] Out: 1950 [Urine:1950]  PHYSICAL EXAMINATION: General: initially lightly sedated but mildly agitated during exam HEENT: ETT, NG tube Cardiac: RRR, no rubs, murmurs or gallops Pulm: few rhonchi anteriorly, moving normal volumes of air Abd: soft, non-distended, BS present, small amount of brown stool in rectal bag Ext: SCDs in place, warm and well perfused, 2+ B/L LE edema Neuro: lightly sedated and intubated (RASS +1)  LABS:  BMET  Recent Labs Lab 12/27/15 0333 12/28/15 0515  12/29/15 0441  NA 142 145 144  K 3.8 4.2 3.7  CL 101 102 103  CO2 30 32 31  BUN 27* 47* 49*  CREATININE 0.98 1.15* 1.00  GLUCOSE 479* 162* 156*    Electrolytes  Recent Labs Lab 12/23/15 0420  12/27/15 0333 12/28/15 0515 12/29/15 0441  CALCIUM  --   < > 9.0 8.8* 8.7*  MG 1.9  --  1.7  --   --   PHOS  --   --  2.8  --   --   < > = values in this interval not displayed.  CBC  Recent Labs Lab 12/27/15 0333 12/28/15 0515 12/29/15 0441  WBC 15.3* 21.5* 16.0*  HGB 8.7* 9.0* 9.0*  HCT 29.7* 32.1* 31.5*  PLT 329 485* 334   Sepsis Markers  Recent  Labs Lab 12/26/15 1034 12/27/15 0333 12/28/15 0515  PROCALCITON 1.12 1.11 0.80    ABG  Recent Labs Lab 12/26/15 0920 12/27/15 0424 12/28/15 0227  PHART 7.400 7.404 7.353  PCO2ART 52.0* 53.6* 63.6*  PO2ART 82.0 105* 95.0    Glucose  Recent Labs Lab 12/29/15 0159 12/29/15 0253 12/29/15 0356 12/29/15 0458 12/29/15 0559 12/29/15 0657  GLUCAP 161* 162* 148* 147* 131* 139*    Imaging Dg Chest Port 1 View  12/29/2015  CLINICAL DATA:  Respiratory failure. EXAM: PORTABLE CHEST 1 VIEW COMPARISON:  12/28/2015. FINDINGS: Endotracheal tube, feeding tube, left IJ line stable position. Cardiomegaly. Diffuse bilateral pulmonary infiltrates, slight interim clearing from prior exam. Bilateral pleural effusions again noted. No pneumothorax. Prior cervical spine fusion . Postsurgical changes both shoulders. IMPRESSION: 1. Lines and tubes in stable position. 2. Cardiomegaly with persistent bilateral pulmonary infiltrates/edema with slight interim clearing. Persistent bilateral pleural effusions. Electronically Signed   By: Maisie Fus  Register   On: 12/29/2015 07:19   STUDIES:  4/04 Echo >> EF 60 to 65%, grade 2 diastolic CHF, severe MS  CULTURES: 4/03 Sputum >> rare yeast c/w candida 4/04 Influenza PCR >> negative 4/04 Respiratory viral panel >> negative  ANTIBIOTICS: 4/01 Doxycycline >> 4/03 4/03 Vancomycin >> 04/06 4/03 Zosyn >>  SIGNIFICANT EVENTS: 3/31 Admit 4/03 VDRF >> to ICU, ARDS protocol, start nimbex 4/05 d/c nimbex  LINES/TUBES: 04/03 ETT 04/03 Left IJ  04/03 Left femoral A-line  DISCUSSION: 55 year old woman admitted on 03/31 with acute HF exacerbation 2/2 non-compliance with diuretic therapy admitted to ICU on 04/03 for acute hypoxic respiratory failure due to ARDS due to aspiration.  ASSESSMENT / PLAN:  PULMONARY A: VDRF - ARDS 2/2 aspiration - AM CXR stable with some clearing of upper lobes. AECOPD P:   Continue vent support with ARDS protocol. Continue  solumedrol and scheduled duonebs. Lasix  IV x 1 Daily SBT AM CXR  CARDIOVASCULAR A:  Acute on chronic diastolic HF - net neg pos 1.1L since admission.  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  Hold ACEI given low BP. Continue Levophed for pressure support. Strict I&Os Continue ASA and statin.  RENAL A:   AKI - resolved P:   Monitor BMP.  GASTROINTESTINAL A:   Loose stools - neg cdiff, bag has not required change in past 8 hours. Nutrition  Risk for stress ulcer. P:   Continue rectal tube. Continue TF while NPO. Continue PPI for stress ulcer ppx.  HEMATOLOGIC A:   Leukocytosis 2/2 to infection and steroids but downtrending. Anemia of critical illness in the  setting of AoCD - stable Risk for VTE. P:  Monitor CBC. Continue [place] lovenox for VTE ppx.  INFECTIOUS A:   Aspiration PNA - afebrile, WBC downtrending P:   Continue zosyn - day 5 Monitor WBC and for clinical improvement.  ENDOCRINE A:   DM type 2 - CBGs 131 - 148 in past 24 hours Hypothyroidism  P:   Continue ICU hyperglycemia protocol.   Continue solumedrol  BID. Continue levothyroxine.  NEUROLOGIC A:   Acute encephalopathy. No purposeful movement off paralytic and with sedation lightened. Bipolar disorder, anxiety Peripheral neuropathy P:   RASS goal: -1 to -2 Continue propofol and fentantyl, prn versed Continue Abilify, prn Versed Continue to hold outpatient Xanax (getting prn Versed) and neurontin  FAMILY   - Updates: Plan to update husband by phone today.    Evelena Peat, DO IMTS PGY3 [phone] 12/29/2015, 10:09 AM   Pulmonary and Critical Care Medicine [facility] Pager: [phone]

## 2015-12-30 ENCOUNTER — Inpatient Hospital Stay (HOSPITAL_COMMUNITY): Payer: Medicaid Other

## 2015-12-30 DIAGNOSIS — J189 Pneumonia, unspecified organism: Secondary | ICD-10-CM

## 2015-12-30 LAB — CBC WITH DIFFERENTIAL/PLATELET
BASOS ABS: 0 10*3/uL (ref 0.0–0.1)
Basophils Relative: 0 %
Eosinophils Absolute: 0.2 10*3/uL (ref 0.0–0.7)
Eosinophils Relative: 2 %
HEMATOCRIT: 30.3 % — AB (ref 36.0–46.0)
Hemoglobin: 8.6 g/dL — ABNORMAL LOW (ref 12.0–15.0)
LYMPHS ABS: 1.8 10*3/uL (ref 0.7–4.0)
LYMPHS PCT: 14 %
MCH: 23.6 pg — AB (ref 26.0–34.0)
MCHC: 28.4 g/dL — ABNORMAL LOW (ref 30.0–36.0)
MCV: 83 fL (ref 78.0–100.0)
MONO ABS: 1 10*3/uL (ref 0.1–1.0)
Monocytes Relative: 7 %
NEUTROS ABS: 10.2 10*3/uL — AB (ref 1.7–7.7)
Neutrophils Relative %: 77 %
Platelets: 306 10*3/uL (ref 150–400)
RBC: 3.65 MIL/uL — ABNORMAL LOW (ref 3.87–5.11)
RDW: 16.8 % — AB (ref 11.5–15.5)
WBC: 13.2 10*3/uL — ABNORMAL HIGH (ref 4.0–10.5)

## 2015-12-30 LAB — BASIC METABOLIC PANEL
ANION GAP: 12 (ref 5–15)
BUN: 49 mg/dL — AB (ref 6–20)
CALCIUM: 8.6 mg/dL — AB (ref 8.9–10.3)
CO2: 32 mmol/L (ref 22–32)
Chloride: 103 mmol/L (ref 101–111)
Creatinine, Ser: 0.9 mg/dL (ref 0.44–1.00)
GFR calc Af Amer: >60 mL/min (ref >60)
GFR calc non Af Amer: >60 mL/min (ref >60)
GLUCOSE: 155 mg/dL — AB (ref 65–99)
POTASSIUM: 3.3 mmol/L — AB (ref 3.5–5.1)
Sodium: 147 mmol/L — ABNORMAL HIGH (ref 135–145)

## 2015-12-30 LAB — POCT I-STAT 3, ART BLOOD GAS (G3+)
Acid-Base Excess: 11 mmol/L — ABNORMAL HIGH (ref 0.0–2.0)
Bicarbonate: 36.5 mEq/L — ABNORMAL HIGH (ref 20.0–24.0)
O2 Saturation: 99 %
PCO2 ART: 53.5 mmHg — AB (ref 35.0–45.0)
Patient temperature: 98.6
TCO2: 38 mmol/L (ref 0–100)
pH, Arterial: 7.442 (ref 7.350–7.450)
pO2, Arterial: 124 mmHg — ABNORMAL HIGH (ref 80.0–100.0)

## 2015-12-30 LAB — GLUCOSE, CAPILLARY
GLUCOSE-CAPILLARY: 152 mg/dL — AB (ref 65–99)
GLUCOSE-CAPILLARY: 94 mg/dL (ref 65–99)
GLUCOSE-CAPILLARY: 118 mg/dL — AB (ref 65–99)
GLUCOSE-CAPILLARY: 225 mg/dL — AB (ref 65–99)
Glucose-Capillary: 148 mg/dL — ABNORMAL HIGH (ref 65–99)

## 2015-12-30 LAB — POTASSIUM: POTASSIUM: 4 mmol/L (ref 3.5–5.1)

## 2015-12-30 LAB — MAGNESIUM: Magnesium: 2.2 mg/dL (ref 1.7–2.4)

## 2015-12-30 LAB — TRIGLYCERIDES: Triglycerides: 185 mg/dL — ABNORMAL HIGH (ref <150)

## 2015-12-30 MED ORDER — FUROSEMIDE 10 MG/ML IJ SOLN
40.0000 mg | Freq: Once | INTRAMUSCULAR | Status: AC
  Administered 2015-12-30: 40 mg via INTRAVENOUS
  Filled 2015-12-30: qty 4

## 2015-12-30 MED ORDER — SODIUM CHLORIDE 0.9% FLUSH: 10.0000 mL | INTRAVENOUS | Status: DC | PRN

## 2015-12-30 MED ORDER — POTASSIUM CHLORIDE 20 MEQ/15ML (10%) PO SOLN
40.0000 meq | Freq: Once | ORAL | Status: AC
  Administered 2015-12-30: 40 meq
  Filled 2015-12-30: qty 30

## 2015-12-30 MED ORDER — CHLORHEXIDINE GLUCONATE 0.12 % MT SOLN
OROMUCOSAL | Status: AC
  Administered 2015-12-30: 15 mL via OROMUCOSAL
  Filled 2015-12-30: qty 15

## 2015-12-30 MED ORDER — SODIUM CHLORIDE 0.9% FLUSH
10.0000 mL | Freq: Two times a day (BID) | INTRAVENOUS | Status: DC
  Administered 2015-12-30 – 2016-01-02 (×5): 10 mL
  Administered 2016-01-02: 20 mL
  Administered 2016-01-03: 30 mL
  Administered 2016-01-03 – 2016-01-07 (×6): 10 mL
  Administered 2016-01-07: 30 mL
  Administered 2016-01-08: 20 mL
  Administered 2016-01-09: 10 mL

## 2015-12-30 MED ORDER — CHLORHEXIDINE GLUCONATE 0.12 % MT SOLN
15.0000 mL | Freq: Once | OROMUCOSAL | Status: AC
  Administered 2015-12-30: 15 mL via OROMUCOSAL

## 2015-12-30 NOTE — Progress Notes (Signed)
PULMONARY / CRITICAL CARE MEDICINE   Name: [name] MRN: [id number] DOB: 1961-08-19    ADMISSION DATE:  12/22/2015  SUBJECTIVE:  Shortness of breath  INTERVAL HISTORY:  Off of Nimbex since AM of 04/05.  Agitated during WUA, no purposeful movement. Tried PST this am > not purposeful, does not follow commands.   VITAL SIGNS: BP 101/51 mmHg  Pulse 60  Temp(Src) 98 F (36.7 C) (Oral)  Resp 30  Ht 5' (1.524 m)  Wt 233 lb 4 oz (105.8 kg)  BMI 45.55 kg/m2  SpO2 93%  HEMODYNAMICS:    VENTILATOR SETTINGS: Vent Mode:  [-] PRVC FiO2 (%):  [40 %-50 %] 40 % Set Rate:  [30 bmp] 30 bmp Vt Set:  [270 mL] 270 mL PEEP:  [8 cmH20] 8 cmH20 Plateau Pressure:  [22 cmH20-32 cmH20] 27 cmH20  INTAKE / OUTPUT: I/O last 3 completed shifts: In: 3536.5 [I.V.:1846.5; NG/GT:1465; IV Piggyback:225] Out: 2875 [Urine:2475; Stool:400]  PHYSICAL EXAMINATION: General: sedated HEENT: ETT, NG tube Cardiac: RRR, no rubs, murmurs or gallops Pulm: few rhonchi anteriorly, moving normal volumes of air Abd: soft, non-distended, BS present Ext: SCDs in place, warm and well perfused, multiple small healed scabs on right lower leg, 1+ B/L LE edema Neuro: sedate and intubated (RASS -4)  LABS:  BMET  Recent Labs Lab 12/28/15 0515 12/29/15 0441 12/30/15 0415  NA 145 144 147*  K 4.2 3.7 3.3*  CL 102 103 103  CO2 32 31 32  BUN 47* 49* 49*  CREATININE 1.15* 1.00 0.90  GLUCOSE 162* 156* 155*    Electrolytes  Recent Labs Lab 12/27/15 0333 12/28/15 0515 12/29/15 0441 12/30/15 0415  CALCIUM 9.0 8.8* 8.7* 8.6*  MG 1.7  --   --  2.2  PHOS 2.8  --   --   --     CBC  Recent Labs Lab 12/28/15 0515 12/29/15 0441 12/30/15 0415  WBC 21.5* 16.0* 13.2*  HGB 9.0* 9.0* 8.6*  HCT 32.1* 31.5* 30.3*  PLT 485* 334 306   Sepsis Markers  Recent Labs Lab 12/26/15 1034 12/27/15 0333 12/28/15 0515  PROCALCITON 1.12 1.11 0.80    ABG  Recent Labs Lab 12/27/15 0424 12/28/15 0227  12/30/15 0443  PHART 7.404 7.353 7.442  PCO2ART 53.6* 63.6* 53.5*  PO2ART 105* 95.0 124.0*    Glucose  Recent Labs Lab 12/29/15 1140 12/29/15 1527 12/29/15 2035 12/29/15 2337 12/30/15 0412 12/30/15 0728  GLUCAP 247* 275* 221* 178* 152* 94    Imaging Dg Chest Port 1 View  12/30/2015  CLINICAL DATA:  ARDS EXAM: PORTABLE CHEST 1 VIEW COMPARISON:  December 29, 2015 FINDINGS: The ETT and left central line are in stable position. No pneumothorax. The pulmonary opacities are improved on the left but stable on the right. Stable cardiomegaly. No other interval changes. IMPRESSION: Improvement of pulmonary opacities on the left.  No other changes. Electronically Signed   By: Gerome Sam III M.D   On: 12/30/2015 07:25   STUDIES:  4/04 Echo >> EF 60 to 65%, grade 2 diastolic CHF, severe MS  CULTURES: 4/03 Sputum >> rare yeast c/w candida 4/04 Influenza PCR >> negative 4/04 Respiratory viral panel >> negative  ANTIBIOTICS: 4/01 Doxycycline >> 4/03 4/03 Vancomycin >> 04/06 4/03 Zosyn >>  SIGNIFICANT EVENTS: 3/31 Admit 4/03 VDRF >> to ICU, ARDS protocol, start nimbex 4/05 d/c nimbex  LINES/TUBES: 04/03 ETT 04/03 Left IJ  04/03 Left femoral A-line  DISCUSSION: 55 year old woman admitted on 03/31 with acute HF  exacerbation 2/2 non-compliance with diuretic therapy admitted to ICU on 04/03 for acute hypoxic respiratory failure due to ARDS due to aspiration.  ASSESSMENT / PLAN:  PULMONARY A: VDRF - ARDS 2/2 aspiration AECOPD P:   Continue vent support with ARDS protocol. Continue solumedrol, decrease dose.  Continue scheduled duonebs. Daily SBT now off paralytic. AM CXR > better  CARDIOVASCULAR A:  Acute on chronic diastolic HF - net neg only 200mL since admission.  Stable CXR. 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  RENAL A:   AKI - better P:   Lasix today.  Replace K.   GASTROINTESTINAL A:   Nutrition  Risk for stress ulcer. P:   Continue TF while NPO. Continue PPI for stress ulcer ppx.  HEMATOLOGIC A:   Leukocytosis 2/2 to infection but likely increasing in the setting of steroids. Anemia of critical illness in the setting of AoCD - stable Risk for VTE. P:  Decrease steroids as below. Monitor CBC. Continue [place] lovenox for VTE ppx.  INFECTIOUS A:   Aspiration PNA - neg MRSA and resp culture with yeast only PCT decreasing. P:   Continue zoxyn - day 5 Monitor WBC.  ENDOCRINE A:   DM type 2 - glucose elevated on steroids Hypothyroidism  P:   Continue ICU hyperglycemia protocol.   Decrease solumedrol from 40mg  BID to 20mg  BID. Continue levothyroxine.  NEUROLOGIC A:   Acute encephalopathy. No purposeful movement off paralytic and with sedation lightened. Bipolar disorder, anxiety Peripheral neuropathy P:   RASS goal: -1 to -2 Continue propofol and fentantyl, prn versed Continue Abilify, prn Versed Continue to hold outpatient Xanax (getting prn Versed) and neurontin  FAMILY  - Updates: need to call husband.   Critical care time on this pt today : 30 mins.   Pollie MeyerJ. Angelo A de Dios, MD 12/30/2015, 12:33 PM [facility] Pager [phone] After 3 pm or if no answer, call [phone]

## 2015-12-31 LAB — BASIC METABOLIC PANEL
Anion gap: 11 (ref 5–15)
BUN: 48 mg/dL — AB (ref 6–20)
CHLORIDE: 105 mmol/L (ref 101–111)
CO2: 32 mmol/L (ref 22–32)
CREATININE: 0.87 mg/dL (ref 0.44–1.00)
Calcium: 8.5 mg/dL — ABNORMAL LOW (ref 8.9–10.3)
Glucose, Bld: 151 mg/dL — ABNORMAL HIGH (ref 65–99)
Potassium: 4.5 mmol/L (ref 3.5–5.1)
Sodium: 148 mmol/L — ABNORMAL HIGH (ref 135–145)

## 2015-12-31 LAB — GLUCOSE, CAPILLARY
GLUCOSE-CAPILLARY: 123 mg/dL — AB (ref 65–99)
GLUCOSE-CAPILLARY: 199 mg/dL — AB (ref 65–99)
GLUCOSE-CAPILLARY: 236 mg/dL — AB (ref 65–99)
GLUCOSE-CAPILLARY: 273 mg/dL — AB (ref 65–99)
GLUCOSE-CAPILLARY: 99 mg/dL (ref 65–99)
Glucose-Capillary: 158 mg/dL — ABNORMAL HIGH (ref 65–99)

## 2015-12-31 LAB — CBC
HCT: 29.2 % — ABNORMAL LOW (ref 36.0–46.0)
HCT: 28.6 % — ABNORMAL LOW (ref 36.0–46.0)
HEMOGLOBIN: 8.2 g/dL — AB (ref 12.0–15.0)
Hemoglobin: 8.8 g/dL — ABNORMAL LOW (ref 12.0–15.0)
MCH: 25 pg — ABNORMAL LOW (ref 26.0–34.0)
MCH: 23.7 pg — AB (ref 26.0–34.0)
MCHC: 30.1 g/dL (ref 30.0–36.0)
MCHC: 28.7 g/dL — AB (ref 30.0–36.0)
MCV: 83 fL (ref 78.0–100.0)
MCV: 82.7 fL (ref 78.0–100.0)
PLATELETS: 237 10*3/uL (ref 150–400)
Platelets: 246 10*3/uL (ref 150–400)
RBC: 3.52 MIL/uL — AB (ref 3.87–5.11)
RBC: 3.46 MIL/uL — ABNORMAL LOW (ref 3.87–5.11)
RDW: 16.9 % — AB (ref 11.5–15.5)
RDW: 16.8 % — AB (ref 11.5–15.5)
WBC: 1.2 10*3/uL — AB (ref 4.0–10.5)
WBC: 8.9 10*3/uL (ref 4.0–10.5)

## 2015-12-31 MED ORDER — FUROSEMIDE 10 MG/ML IJ SOLN
40.0000 mg | Freq: Once | INTRAMUSCULAR | Status: AC
  Administered 2015-12-31: 40 mg via INTRAVENOUS
  Filled 2015-12-31: qty 4

## 2015-12-31 NOTE — Progress Notes (Signed)
PULMONARY / CRITICAL CARE MEDICINE   Name: [name] Dacy MRN: [id number] DOB: December 13, 1960    ADMISSION DATE:  12/22/2015  SUBJECTIVE:  Shortness of breath  INTERVAL HISTORY:  Pressure better, off levophed since 04/08.  Still no purposeful movement during WUA.  Allergies  Allergen Reactions  . Trazodone And Nefazodone Anaphylaxis and Other (See Comments)    Shortness of breath.  . Celecoxib Nausea Only  . Ibuprofen Nausea Only    No current facility-administered medications on file prior to encounter.   Current Outpatient Prescriptions on File Prior to Encounter  Medication Sig  . albuterol (PROAIR HFA) 108 (90 BASE) MCG/ACT inhaler Inhale 2 puffs into the lungs every 6 (six) hours. scheduled  . ALPRAZolam (XANAX) 0.5 MG tablet Take 1 tablet (0.5 mg total) by mouth at bedtime as needed for anxiety. (Patient taking differently: Take 0.5 mg by mouth 2 (two) times daily. )  . ARIPiprazole (ABILIFY) 5 MG tablet Take 5 mg by mouth daily.  Marland Kitchen. aspirin EC 81 MG tablet Take 1 tablet (81 mg total) by mouth daily.  . cetirizine (ZYRTEC) 10 MG tablet Take 10 mg by mouth daily.   . furosemide (LASIX) 40 MG tablet Take 1 tablet (40 mg total) by mouth daily.  Marland Kitchen. gabapentin (NEURONTIN) 100 MG capsule Take 1 capsule (100 mg total) by mouth 3 (three) times daily.  Marland Kitchen. glimepiride (AMARYL) 4 MG tablet Take 4 mg by mouth daily.  . insulin aspart (NOVOLOG FLEXPEN) 100 UNIT/ML FlexPen Inject 6-15 Units into the skin 3 (three) times daily as needed for high blood sugar (CBG >100). CBG 100-200 6-8 units, >200 15 units  . insulin glargine (LANTUS) 100 UNIT/ML injection Inject 0.6 mLs (60 Units total) into the skin daily. (Patient taking differently: Inject 48 Units into the skin at bedtime. )  . levothyroxine (SYNTHROID, LEVOTHROID) 125 MCG tablet Take 1 tablet (125 mcg total) by mouth daily before breakfast.  . losartan (COZAAR) 100 MG tablet Take 1 tablet (100 mg total) by mouth daily.  . metoprolol tartrate  (LOPRESSOR) 25 MG tablet Take 0.5 tablets (12.5 mg total) by mouth 2 (two) times daily.  Marland Kitchen. omeprazole (PRILOSEC) 20 MG capsule Take 20 mg by mouth daily.  . potassium chloride SA (K-DUR,KLOR-CON) 20 MEQ tablet Take 1 tablet (20 mEq total) by mouth daily.  . simvastatin (ZOCOR) 10 MG tablet Take 10 mg by mouth daily.    VITAL SIGNS: BP 115/50 mmHg  Pulse 57  Temp(Src) 97.9 F (36.6 C) (Axillary)  Resp 30  Ht 5' (1.524 m)  Wt 234 lb 12.6 oz (106.5 kg)  BMI 45.85 kg/m2  SpO2 97%  HEMODYNAMICS:    VENTILATOR SETTINGS: Vent Mode:  [-] PRVC FiO2 (%):  [40 %] 40 % Set Rate:  [30 bmp] 30 bmp Vt Set:  [270 mL] 270 mL PEEP:  [5 cmH20-8 cmH20] 5 cmH20 Plateau Pressure:  [24 cmH20-27 cmH20] 25 cmH20  INTAKE / OUTPUT: I/O last 3 completed shifts: In: 3045.6 [I.V.:1450.6; NG/GT:1370; IV Piggyback:225] Out: 2975 [Urine:2750; Stool:225]  PHYSICAL EXAMINATION: General: lightly sedated, non-purposeful movement of arms during exam HEENT: ETT, NG tube Cardiac: RRR, no rubs, murmurs or gallops Pulm: vent supported, few wheezes anteriorly, moving normal volumes of air Abd: soft, non-distended, BS present, 200mL of loose brown stool in rectal bag Ext: SCDs in place, warm and well perfused, 2+ B/L LE edema (to thighs) Neuro: RASS - 1  LABS:  BMET  Recent Labs Lab 12/29/15 0441 12/30/15 0415 12/30/15 1727 12/31/15 [id number]  NA 144 147*  --  148*  K 3.7 3.3* 4.0 4.5  CL 103 103  --  105  CO2 31 32  --  32  BUN 49* 49*  --  48*  CREATININE 1.00 0.90  --  0.87  GLUCOSE 156* 155*  --  151*    Electrolytes  Recent Labs Lab 12/27/15 0333  12/29/15 0441 12/30/15 0415 12/31/15 0237  CALCIUM 9.0  < > 8.7* 8.6* 8.5*  MG 1.7  --   --  2.2  --   PHOS 2.8  --   --   --   --   < > = values in this interval not displayed.  CBC  Recent Labs Lab 12/29/15 0441 12/30/15 0415 12/31/15 0237  WBC 16.0* 13.2* 1.2*  HGB 9.0* 8.6* 8.8*  HCT 31.5* 30.3* 29.2*  PLT 334 306 237   Sepsis  Markers  Recent Labs Lab 12/26/15 1034 12/27/15 0333 12/28/15 0515  PROCALCITON 1.12 1.11 0.80    ABG  Recent Labs Lab 12/27/15 0424 12/28/15 0227 12/30/15 0443  PHART 7.404 7.353 7.442  PCO2ART 53.6* 63.6* 53.5*  PO2ART 105* 95.0 124.0*    Glucose  Recent Labs Lab 12/30/15 1317 12/30/15 1603 12/30/15 1956 12/31/15 0008 12/31/15 0434 12/31/15 0752  GLUCAP 118* 148* 225* 199* 123* 99    Imaging No results found. STUDIES:  4/04 Echo >> EF 60 to 65%, grade 2 diastolic CHF, severe MS  CULTURES: 4/03 Sputum >> rare yeast c/w candida 4/04 Influenza PCR >> negative 4/04 Respiratory viral panel >> negative  ANTIBIOTICS: 4/01 Doxycycline >> 4/03 4/03 Vancomycin >> 04/06 4/03 Zosyn >>  SIGNIFICANT EVENTS: 3/31 Admit 4/03 VDRF >> to ICU, ARDS protocol, start nimbex 4/05 d/c nimbex  LINES/TUBES: 04/03 ETT 04/03 Left IJ  04/03 Left femoral A-line  DISCUSSION: 55 year old woman admitted on 03/31 with acute HF exacerbation 2/2 non-compliance with diuretic therapy admitted to ICU on 04/03 for acute hypoxic respiratory failure due to ARDS due to aspiration.  ASSESSMENT / PLAN:  PULMONARY A: VDRF - ARDS 2/2 aspiration - improving AECOPD P:   Continue vent support with ARDS protocol. Continue steroid and scheduled duonebs. Lasix  IV x 1 Daily SBT AM CXR  CARDIOVASCULAR A:  Acute on chronic diastolic HF - net pos since admission.  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 HLD P:  Lasix  x 1 Continue home BB.  Can likely resume ACEI soon. Strict I&Os Continue ASA and statin.  RENAL A:   AKI - resolved Hyperkalemia - improved to 4.5 today. Hypernatremia  P:   Monitor BMP. May need to add D5W.  GASTROINTESTINAL A:   Loose stools - improving Nutrition  Risk for stress ulcer. P:   Continue rectal tube. Continue TF while NPO. Continue PPI for stress  ulcer ppx.  HEMATOLOGIC A:   Leukocytosis 2/2 to infection and steroids - improving Anemia of critical illness in the setting of AoCD - stable Risk for VTE. P:  Monitor CBC.  Awaiting AM repeat. Continue [place] lovenox for VTE ppx.  INFECTIOUS A:   Aspiration PNA - improving P:   Continue zosyn - day 7 Monitor WBC and for clinical improvement.  ? Accuracy of this AM CBC (WBC 1.2), awaiting repeat.  ENDOCRINE A:   DM type 2 - CBGs 94-225 in past 24 hours; AM CBG 99 Hypothyroidism  P:   Continue SSI-R and Lantus 25  units qHS. Continue prednisone  daily. Continue levothyroxine.  NEUROLOGIC A:   Acute encephalopathy. No purposeful movement off paralytic and with sedation lightened. Bipolar disorder, anxiety Peripheral neuropathy P:   RASS goal: -1 to -2 Continue propofol and fentantyl, prn versed Continue Abilify, prn Versed. Continue to hold outpatient Xanax (getting prn Versed) and Neurontin (given encephalopathy).  FAMILY   - Updates: Plan to update husband by phone today.    Evelena Peat, DO IMTS PGY3 [phone] 12/31/2015, 8:09 AM   Pulmonary and Critical Care Medicine [facility] Pager: [phone]  We formulated the plan together. A and P as above. Taking a long time to "wake up" -- has baseline dementia.  Changing around sedation meds. Cont abx. Cont diuresis.  Tried calling husband to update -- no answer. May need palliative care consult.   Critical care time with this  pt today : 31 minutes  [name]. Alexis Frock, MD 12/31/2015, 4:46 PM [facility] Pager [phone] After 3 pm or if no answer, call [phone]

## 2015-12-31 NOTE — Progress Notes (Signed)
This note also relates to the following rows which could not be included: SpO2 - Cannot attach notes to unvalidated device data   Increase VT and decrease rate to maintain minute ventlation around 8 verbal order Dr. Lucy Chrisios

## 2016-01-01 ENCOUNTER — Inpatient Hospital Stay (HOSPITAL_COMMUNITY): Payer: Medicaid Other

## 2016-01-01 DIAGNOSIS — R600 Localized edema: Secondary | ICD-10-CM

## 2016-01-01 DIAGNOSIS — G9340 Encephalopathy, unspecified: Secondary | ICD-10-CM | POA: Insufficient documentation

## 2016-01-01 LAB — BASIC METABOLIC PANEL
ANION GAP: 10 (ref 5–15)
Anion gap: 11 (ref 5–15)
BUN: 39 mg/dL — AB (ref 6–20)
BUN: 31 mg/dL — ABNORMAL HIGH (ref 6–20)
CALCIUM: 8.6 mg/dL — AB (ref 8.9–10.3)
CHLORIDE: 104 mmol/L (ref 101–111)
CO2: 35 mmol/L — AB (ref 22–32)
CO2: 34 mmol/L — AB (ref 22–32)
Calcium: 9.1 mg/dL (ref 8.9–10.3)
Chloride: 103 mmol/L (ref 101–111)
Creatinine, Ser: 0.72 mg/dL (ref 0.44–1.00)
Creatinine, Ser: 0.67 mg/dL (ref 0.44–1.00)
GFR calc Af Amer: >60 mL/min (ref >60)
GFR calc non Af Amer: >60 mL/min (ref >60)
GLUCOSE: 188 mg/dL — AB (ref 65–99)
Glucose, Bld: 343 mg/dL — ABNORMAL HIGH (ref 65–99)
Potassium: 2.8 mmol/L — ABNORMAL LOW (ref 3.5–5.1)
Potassium: 4.1 mmol/L (ref 3.5–5.1)
Sodium: 149 mmol/L — ABNORMAL HIGH (ref 135–145)
Sodium: 148 mmol/L — ABNORMAL HIGH (ref 135–145)

## 2016-01-01 LAB — GLUCOSE, CAPILLARY
GLUCOSE-CAPILLARY: 203 mg/dL — AB (ref 65–99)
Glucose-Capillary: 155 mg/dL — ABNORMAL HIGH (ref 65–99)
Glucose-Capillary: 167 mg/dL — ABNORMAL HIGH (ref 65–99)
Glucose-Capillary: 241 mg/dL — ABNORMAL HIGH (ref 65–99)
Glucose-Capillary: 258 mg/dL — ABNORMAL HIGH (ref 65–99)
Glucose-Capillary: 95 mg/dL (ref 65–99)

## 2016-01-01 LAB — CBC
HCT: 28.6 % — ABNORMAL LOW (ref 36.0–46.0)
Hemoglobin: 8.3 g/dL — ABNORMAL LOW (ref 12.0–15.0)
MCH: 23.7 pg — AB (ref 26.0–34.0)
MCHC: 29 g/dL — ABNORMAL LOW (ref 30.0–36.0)
MCV: 81.7 fL (ref 78.0–100.0)
Platelets: 240 10*3/uL (ref 150–400)
RBC: 3.5 MIL/uL — ABNORMAL LOW (ref 3.87–5.11)
RDW: 16.9 % — AB (ref 11.5–15.5)
WBC: 8.8 10*3/uL (ref 4.0–10.5)

## 2016-01-01 LAB — TRIGLYCERIDES
TRIGLYCERIDES: 102 mg/dL (ref <150)
Triglycerides: 130 mg/dL (ref <150)

## 2016-01-01 LAB — AMMONIA: AMMONIA: 31 umol/L (ref 9–35)

## 2016-01-01 MED ORDER — QUETIAPINE FUMARATE 50 MG PO TABS
25.0000 mg | ORAL_TABLET | Freq: Two times a day (BID) | ORAL | Status: DC
Start: 2016-01-01 — End: 2016-01-01

## 2016-01-01 MED ORDER — INSULIN GLARGINE 100 UNIT/ML ~~LOC~~ SOLN
30.0000 [IU] | Freq: Every day | SUBCUTANEOUS | Status: DC
  Administered 2016-01-01: 30 [IU] via SUBCUTANEOUS
  Filled 2016-01-01: qty 0.3

## 2016-01-01 MED ORDER — POTASSIUM CHLORIDE 10 MEQ/50ML IV SOLN
INTRAVENOUS | Status: AC
  Administered 2016-01-01: 10 meq
  Filled 2016-01-01: qty 50

## 2016-01-01 MED ORDER — VALPROATE SODIUM 250 MG/5ML PO SYRP
250.0000 mg | ORAL_SOLUTION | Freq: Three times a day (TID) | ORAL | Status: DC
  Administered 2016-01-01 – 2016-01-04 (×11): 250 mg via ORAL
  Filled 2016-01-01 (×12): qty 5

## 2016-01-01 MED ORDER — POTASSIUM CHLORIDE 10 MEQ/50ML IV SOLN
10.0000 meq | INTRAVENOUS | Status: AC
  Administered 2016-01-01: 10 meq via INTRAVENOUS
  Filled 2016-01-01: qty 50

## 2016-01-01 MED ORDER — POTASSIUM CHLORIDE 20 MEQ/15ML (10%) PO SOLN
40.0000 meq | Freq: Three times a day (TID) | ORAL | Status: AC
  Administered 2016-01-01 (×2): 40 meq
  Filled 2016-01-01 (×2): qty 30

## 2016-01-01 MED ORDER — DEXMEDETOMIDINE HCL IN NACL 400 MCG/100ML IV SOLN
0.4000 ug/kg/h | INTRAVENOUS | Status: DC
  Administered 2016-01-01 – 2016-01-02 (×4): 0.4 ug/kg/h via INTRAVENOUS
  Filled 2016-01-01: qty 100
  Filled 2016-01-01 (×2): qty 50
  Filled 2016-01-01: qty 100
  Filled 2016-01-01: qty 50

## 2016-01-01 MED ORDER — POTASSIUM CHLORIDE 20 MEQ/15ML (10%) PO SOLN
40.0000 meq | ORAL | Status: AC
  Administered 2016-01-01 (×2): 40 meq
  Filled 2016-01-01 (×2): qty 30

## 2016-01-01 MED ORDER — FUROSEMIDE 10 MG/ML IJ SOLN
40.0000 mg | Freq: Four times a day (QID) | INTRAMUSCULAR | Status: AC
  Administered 2016-01-01 – 2016-01-02 (×3): 40 mg via INTRAVENOUS
  Filled 2016-01-01 (×3): qty 4

## 2016-01-01 MED ORDER — FREE WATER
250.0000 mL | Freq: Four times a day (QID) | Status: DC
  Administered 2016-01-01 – 2016-01-05 (×16): 250 mL

## 2016-01-01 MED ORDER — DEXTROSE 5 % IV SOLN
INTRAVENOUS | Status: DC
  Administered 2016-01-01: 12:00:00 via INTRAVENOUS

## 2016-01-01 MED ORDER — POTASSIUM CHLORIDE 10 MEQ/100ML IV SOLN: 10.0000 meq | INTRAVENOUS | Status: DC

## 2016-01-01 NOTE — Progress Notes (Signed)
[facility] FOR AM LAB REPLACEMENT ONLY  The patient does apply for the [facility] Electrolyte [facility] Protocol based on the criteria listed below:   1. Is GFR >/= 40 ml/min? Yes.    Patient's GFR today is >60 2. Is urine output >/= 0.5 ml/kg/hr for the last 6 hours? Yes.   Patient's UOP is 1.02 ml/kg/hr 3. Is BUN < 60 mg/dL? Yes.    Patient's BUN today is 39 4. Abnormal electrolyte(s): K+ 2.8  5. Ordered repletion with: per [facility] protocol 6. If a panic level lab has been reported, has the CCM MD in charge been notified? No..   Physician:  Dr Laureen AbrahamsSood  [name], [name] 01/01/2016 6:03 AM

## 2016-01-01 NOTE — Consult Note (Signed)
Neurology Consultation Reason for Consult: Altered mental status Referring Physician: Jefm MilesJake Yacoub  CC: Altered mental status  History is obtained from: Chart, referring physician  HPI: [name] is a 55 y.o. female who presented with shortness of breath and had a respiratory failure requiring intubation for ARDS. She was ventilated with sedation according to ARDS protocol with Nimbex since April 3. On cessation of paralytic and sedative, she had continued altered mental status and was not following commands and therefore neurology has been consulted.   ROS:  Unable to obtain due to altered mental status.   Past Medical History  Diagnosis Date  . Hypertension   . Anemia   . Chronic back pain     "all over"  . Neuropathy (HCC)   . Hypercholesterolemia   . Major depressive disorder, recurrent (HCC)   . Generalized anxiety disorder   . Panic disorder with agoraphobia   . Tobacco abuse   . MI (myocardial infarction) (HCC) 2007    a. Per patient report she had a heart attack in 2007. Our consult note from 11/2006 indicates the patient had been seen in 07/2006 by her PCP and was told based on an EKG that she may have had a prior MI. She had undergone a low risk stress test at that time.  . Type II diabetes mellitus (HCC)   . Heart murmur   . Emphysema of lung (HCC)   . Pneumonia 1960's X 2  . On home oxygen therapy     "2L prn" (04/12/2015)  . Thin blood (HCC)   . History of blood transfusion     "related to OR"  . GERD (gastroesophageal reflux disease)   . Arthritis     "hands, left knee, left shoulder, back feet" (04/12/2015)  . Bipolar disorder (HCC)   . Rheumatic fever   . Mitral stenosis   . COPD (chronic obstructive pulmonary disease) (HCC)   . Hypothyroidism   . Chronic back pain   . PONV (postoperative nausea and vomiting)   . Seizures (HCC)     at age 89  . CHF (congestive heart failure) (HCC)   . Fracture of right humerus 04/13/2015  . Left patella fracture  04/12/2015  . Shoulder fracture, right 04/12/2015  . Shortness of breath dyspnea      Family History  Problem Relation Age of Onset  . Heart disease Mother   . Lung cancer Father     was a former smoker  . Heart attack Mother   . Stroke Mother   . Hypertension Mother      Social History:  reports that she quit smoking about 3 years ago. Her smoking use included Cigarettes. She has a 20 pack-year smoking history. She has never used smokeless tobacco. She reports that she drinks alcohol. She reports that she uses illicit drugs ("Crack" cocaine).   Exam: Current vital signs: BP 129/56 mmHg  Pulse 59  Temp(Src) 99 F (37.2 C) (Oral)  Resp 18  Ht 5' (1.524 m)  Wt 106 kg (233 lb 11 oz)  BMI 45.64 kg/m2  SpO2 95% Vital signs in last 24 hours: Temp:  [98.3 F (36.8 C)-99.8 F (37.7 C)] 99 F (37.2 C) (04/10 1500) Pulse Rate:  [51-90] 59 (04/10 1900) Resp:  [15-23] 18 (04/10 1900) BP: (100-154)/(51-78) 129/56 mmHg (04/10 1600) SpO2:  [82 %-100 %] 95 % (04/10 1900) Arterial Line BP: (101-182)/(43-90) 145/65 mmHg (04/10 1900) FiO2 (%):  [40 %] 40 % (04/10 1800) Weight:  [161[106  kg (233 lb 11 oz)] 106 kg (233 lb 11 oz) (04/10 0438)   Physical Exam  Constitutional: Appears well-developed and well-nourished.  Psych: Intubated Eyes: No scleral injection HENT: ET tube in place Head: Normocephalic.  Cardiovascular: Normal rate and regular rhythm.  Respiratory: Ventilated GI: Soft.  No distension. There is no tenderness.  Skin: WDI  Neuro: Mental Status: Patient opens eyes to voice, she follows commands to squeeze fingers in both hands as well as wiggle toes in both feet. She fixates and tracks. [name] still clearly encephalopathic. Cranial Nerves: II: Blinks to threat from bilateral directions Pupils are equal, round, and reactive to light.   III,IV, VI: She fixates and tracks across midline, but does not fully look to the left. V: VII: Blinks to eyelid stimulation  bilaterally Motor: She follows commands in all 4 extremities Sensory: Response to noxious stimulation 4 Cerebellar: Does not comply  EEG revealed only slowing  I have reviewed labs in epic and the results pertinent to this consultation are: TSH 18  I have reviewed the images obtained: CT head-negative  Impression: 55 year old female slow to wake up following sedation and paralytics. I suspect that this may be some prolonged sedative affect from propofol given that it is fat soluble, also possible would be that this is compensated by the fact that she is hypothyroid, though is currently being treated with replacement.  Recommendations: 1) consider increasing Synthroid given that she is on her home dose and her TSH on admission was 18 2) check ammonia 3) hold sedation 4) neurology will continue to follow   Ritta Slot, MD [facility] [phone]  If 7pm- 7am, please page neurology on call as listed in AMION.

## 2016-01-01 NOTE — Progress Notes (Signed)
EEG Completed; Results Pending  

## 2016-01-01 NOTE — Progress Notes (Addendum)
PULMONARY / CRITICAL CARE MEDICINE   Name: [name] MRN: [id number] DOB: 11-12-1960    ADMISSION DATE:  12/22/2015  SUBJECTIVE:  Shortness of breath  INTERVAL HISTORY:  RN reports no purposeful movement during WUA.  During my exam she seemed to wiggles her toes on command but I could not get her to do this again and she did not follow any other commands.  Allergies  Allergen Reactions  . Trazodone And Nefazodone Anaphylaxis and Other (See Comments)    Shortness of breath.  . Celecoxib Nausea Only  . Ibuprofen Nausea Only    No current facility-administered medications on file prior to encounter.   Current Outpatient Prescriptions on File Prior to Encounter  Medication Sig  . albuterol (PROAIR HFA) 108 (90 BASE) MCG/ACT inhaler Inhale 2 puffs into the lungs every 6 (six) hours. scheduled  . ALPRAZolam (XANAX) 0.5 MG tablet Take 1 tablet (0.5 mg total) by mouth at bedtime as needed for anxiety. (Patient taking differently: Take 0.5 mg by mouth 2 (two) times daily. )  . ARIPiprazole (ABILIFY) 5 MG tablet Take 5 mg by mouth daily.  Marland Kitchen aspirin EC 81 MG tablet Take 1 tablet (81 mg total) by mouth daily.  . cetirizine (ZYRTEC) 10 MG tablet Take 10 mg by mouth daily.   . furosemide (LASIX) 40 MG tablet Take 1 tablet (40 mg total) by mouth daily.  Marland Kitchen gabapentin (NEURONTIN) 100 MG capsule Take 1 capsule (100 mg total) by mouth 3 (three) times daily.  Marland Kitchen glimepiride (AMARYL) 4 MG tablet Take 4 mg by mouth daily.  . insulin aspart (NOVOLOG FLEXPEN) 100 UNIT/ML FlexPen Inject 6-15 Units into the skin 3 (three) times daily as needed for high blood sugar (CBG >100). CBG 100-200 6-8 units, >200 15 units  . insulin glargine (LANTUS) 100 UNIT/ML injection Inject 0.6 mLs (60 Units total) into the skin daily. (Patient taking differently: Inject 48 Units into the skin at bedtime. )  . levothyroxine (SYNTHROID, LEVOTHROID) 125 MCG tablet Take 1 tablet (125 mcg total) by mouth daily before breakfast.   . losartan (COZAAR) 100 MG tablet Take 1 tablet (100 mg total) by mouth daily.  . metoprolol tartrate (LOPRESSOR) 25 MG tablet Take 0.5 tablets (12.5 mg total) by mouth 2 (two) times daily.  Marland Kitchen omeprazole (PRILOSEC) 20 MG capsule Take 20 mg by mouth daily.  . potassium chloride SA (K-DUR,KLOR-CON) 20 MEQ tablet Take 1 tablet (20 mEq total) by mouth daily.  . simvastatin (ZOCOR) 10 MG tablet Take 10 mg by mouth daily.    VITAL SIGNS: BP 107/61 mmHg  Pulse 72  Temp(Src) 98.3 F (36.8 C) (Axillary)  Resp 18  Ht 5' (1.524 m)  Wt 233 lb 11 oz (106 kg)  BMI 45.64 kg/m2  SpO2 99%  HEMODYNAMICS:    VENTILATOR SETTINGS: Vent Mode:  [-] PRVC FiO2 (%):  [40 %] 40 % Set Rate:  [18 bmp-30 bmp] 18 bmp Vt Set:  [270 mL-420 mL] 420 mL PEEP:  [5 cmH20-8 cmH20] 5 cmH20 Plateau Pressure:  [23 cmH20-35 cmH20] 35 cmH20  INTAKE / OUTPUT: I/O last 3 completed shifts: In: 3479.3 [I.V.:1719.3; NG/GT:1560; IV Piggyback:200] Out: 4575 [Urine:3700; Stool:875]  PHYSICAL EXAMINATION: General: lightly sedated, non-purposeful movement of arms during exam, ?wiggled toes on command HEENT: ETT, NG tube Cardiac: RRR, no rubs, murmurs or gallops Pulm: vent supported, coarse BS anteriorly Abd: soft, non-distended, BS present, of loose brown stool in rectal bag Ext: SCDs in place, warm and well perfused, 2+  B/L LE edema (to thighs) Neuro: RASS - 1  LABS:  BMET  Recent Labs Lab 12/30/15 0415 12/30/15 1727 12/31/15 0237 01/01/16 0512  NA 147*  --  148* 149*  K 3.3* 4.0 4.5 2.8*  CL 103  --  105 103  CO2 32  --  32 35*  BUN 49*  --  48* 39*  CREATININE 0.90  --  0.87 0.72  GLUCOSE 155*  --  151* 188*    Electrolytes  Recent Labs Lab 12/27/15 0333  12/30/15 0415 12/31/15 0237 01/01/16 0512  CALCIUM 9.0  < > 8.6* 8.5* 8.6*  MG 1.7  --  2.2  --   --   PHOS 2.8  --   --   --   --   < > = values in this interval not displayed.  CBC  Recent Labs Lab 12/31/15 0237 12/31/15 0750  01/01/16 0512  WBC 1.2* 8.9 8.8  HGB 8.8* 8.2* 8.3*  HCT 29.2* 28.6* 28.6*  PLT 237 246 240   Sepsis Markers  Recent Labs Lab 12/26/15 1034 12/27/15 0333 12/28/15 0515  PROCALCITON 1.12 1.11 0.80    ABG  Recent Labs Lab 12/27/15 0424 12/28/15 0227 12/30/15 0443  PHART 7.404 7.353 7.442  PCO2ART 53.6* 63.6* 53.5*  PO2ART 105* 95.0 124.0*    Glucose  Recent Labs Lab 12/31/15 1144 12/31/15 1551 12/31/15 2033 01/01/16 0016 01/01/16 0427 01/01/16 0812  GLUCAP 158* 236* 273* 241* 167* 95    Imaging Dg Chest Port 1 View  01/01/2016  CLINICAL DATA:  ARDS. EXAM: PORTABLE CHEST 1 VIEW COMPARISON:  12/30/2015. FINDINGS: Endotracheal tube, feeding tube, left IJ line in stable position. Cardiomegaly with diffuse bilateral pulmonary alveolar infiltrates are again noted. Small bilateral pleural effusions. Similar findings noted on prior exam. No pneumothorax. Cervical spine fusion. Postsurgical changes both shoulders. IMPRESSION: 1. Lines and tubes in stable position. 2. Cardiomegaly with bilateral pulmonary infiltrates and pleural effusions consistent congestive heart failure. Similar findings noted on prior exam. Electronically Signed   By: Maisie Fushomas  Register   On: 01/01/2016 07:14   STUDIES:  4/04 Echo >> EF 60 to 65%, grade 2 diastolic CHF, severe MS  CULTURES: 4/03 Sputum >> rare yeast c/w candida 4/04 Influenza PCR >> negative 4/04 Respiratory viral panel >> negative  ANTIBIOTICS: 4/01 Doxycycline >> 4/03 4/03 Vancomycin >> 04/06 4/03 Zosyn >> 04/10  SIGNIFICANT EVENTS: 3/31 Admit 4/03 VDRF >> to ICU, ARDS protocol, start nimbex 4/05 d/c nimbex  LINES/TUBES: 04/03 ETT 04/03 Left IJ  04/03 Left femoral A-line  DISCUSSION: 55 year old woman admitted on 03/31 with acute HF exacerbation 2/2 non-compliance with diuretic therapy admitted to ICU on 04/03 for acute hypoxic respiratory failure due to ARDS due to aspiration.  ASSESSMENT /  PLAN:  PULMONARY A: VDRF - ARDS 2/2 aspiration - improving AECOPD P:   Continue steroid and scheduled duonebs. Lasix 40mg  IV q6 x3 doses. Daily SBT. AM CXR. PS trials to start now.  CARDIOVASCULAR A:  Acute on chronic diastolic HF - net neg 965mL since admission.  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 HLD P:  Lasix 40mg  as above. Continue home BB.  Can likely resume ACEI soon. Strict I&Os Continue ASA and statin.  RENAL A:   AKI - resolved Hypokalemia - repleted Hypernatremia  P:   Monitor BMP. Lasix 40mg  IV q6 x3 doses. Free water. D5W at 50 ml/hr x20 hours. Replace electrolytes  as indicated.  GASTROINTESTINAL A:   Loose stools - improving Nutrition  Risk for stress ulcer. P:   Continue rectal tube. Continue TF while NPO. Continue PPI for stress ulcer ppx.  HEMATOLOGIC A:   Leukocytosis 2/2 to infection and steroids - resolved Anemia of critical illness in the setting of AoCD - stable Risk for VTE. P:  Monitor CBC.   Continue [place] lovenox for VTE ppx.  INFECTIOUS A:   Aspiration PNA - has completed abx course P:   D/c zosyn today  ENDOCRINE A:   DM type 2 - CBGs 95-273 in past 24 hours; AM CBG 95 Hypothyroidism  P:   Continue SSI-R INCREASE Lantus from 25 units qHS to 30 units qHS. Continue prednisone  daily. Continue levothyroxine.  NEUROLOGIC A:   Acute encephalopathy. No purposeful movement off paralytic and with sedation lightened. Bipolar disorder, anxiety Peripheral neuropathy P:   RASS goal: 0 to -1 Continue propofol and fentantyl, prn versed Continue Abilify, prn Versed. Continue to hold outpatient Xanax (getting prn Versed) and Neurontin (given encephalopathy). D/C propofol. Precedex drip. CT of the head. Neurology consult ordered. EEG ordered. Seroquel 25 mg PO BID.  FAMILY   - Updates: Unable to reach husband by phone yesterday.  Need to  try and contact today.   Evelena Peat, DO [facility] PGY3 [phone] 01/01/2016, 8:53 AM  Attending Note:  55 year old female with aspiration, ARDS, now in pulmonary edema.  Mental status remains poor.  CT and EEG ordered, neurology consult called.  Change propofol to precedex.  More aggressive diureses today.  D5W ordered.  Above note edited in full.  The patient is critically ill with multiple organ systems failure and requires high complexity decision making for assessment and support, frequent evaluation and titration of therapies, application of advanced monitoring technologies and extensive interpretation of multiple databases.   Critical Care Time devoted to patient care services described in this note is  35  Minutes. This time reflects time of care of this signee Dr Koren Bound. This critical care time does not reflect procedure time, or teaching time or supervisory time of PA/NP/Med student/Med Resident etc but could involve care discussion time.  Alyson Reedy, M.D. [facility] Pulmonary/Critical Care Medicine. Pager: [phone]. After hours pager: [phone].

## 2016-01-01 NOTE — Procedures (Signed)
HPI:  55 y/o with encephalopathy.    TECHNICAL SUMMARY:  A multichannel referential and bipolar montage EEG using the standard international 10-20 system was performed on the patient described as encephalopathic.  Propofol was turned off 2 minutes into the recording.  There is no occipital dominant rhythm.  As the propofol is turned off and the recording progresses, 5-6 Hz activity can be seen throughout all head regions.  2-3 Hz activity can be seen overriding.   Low voltage fast (beta) activity is distributed symmetrically and maximally over the anterior head regions.  ACTIVATION:  Stepwise photic stimulation and hyperventilation were not performed.  EPILEPTIFORM ACTIVITY:  There were no spikes, sharp waves or paroxysmal activity.  SLEEP:  No sleep  CARDIAC:  The EKG lead was not recorded properly during this recording.  IMPRESSION:  This is an abnormal EEG demonstrating a moderate diffuse slowing of electrocerebral activity.  This can be seen in a wide variety of encephalopathic state including those of a toxic, metabolic, or degenerative nature.  There were no focal, hemispheric, or lateralizing features.  No epileptiform activity was recorded.  As above, propofol was turned off about 2 minutes into the recording, but it is unclear whether or not the patient was still on fentanyl.  If so, repeat of the EEG off of fentanyl may be of value once the clinical situation is appropriate to do so.

## 2016-01-02 ENCOUNTER — Inpatient Hospital Stay (HOSPITAL_COMMUNITY): Payer: Medicaid Other

## 2016-01-02 DIAGNOSIS — J80 Acute respiratory distress syndrome: Secondary | ICD-10-CM | POA: Insufficient documentation

## 2016-01-02 LAB — BLOOD GAS, ARTERIAL
ACID-BASE EXCESS: 11 mmol/L — AB (ref 0.0–2.0)
BICARBONATE: 34.8 meq/L — AB (ref 20.0–24.0)
DRAWN BY: 41308
FIO2: 0.4
O2 SAT: 95.2 %
PEEP: 5 cmH2O
PH ART: 7.522 — AB (ref 7.350–7.450)
Patient temperature: 98.6
RATE: 18 resp/min
TCO2: 36.1 mmol/L (ref 0–100)
VT: 420 mL
pCO2 arterial: 42.6 mmHg (ref 35.0–45.0)
pO2, Arterial: 72.9 mmHg — ABNORMAL LOW (ref 80.0–100.0)

## 2016-01-02 LAB — GLUCOSE, CAPILLARY
GLUCOSE-CAPILLARY: 184 mg/dL — AB (ref 65–99)
GLUCOSE-CAPILLARY: 235 mg/dL — AB (ref 65–99)
GLUCOSE-CAPILLARY: 305 mg/dL — AB (ref 65–99)
GLUCOSE-CAPILLARY: 328 mg/dL — AB (ref 65–99)
Glucose-Capillary: 319 mg/dL — ABNORMAL HIGH (ref 65–99)
Glucose-Capillary: 317 mg/dL — ABNORMAL HIGH (ref 65–99)

## 2016-01-02 LAB — BASIC METABOLIC PANEL
Anion gap: 11 (ref 5–15)
BUN: 30 mg/dL — ABNORMAL HIGH (ref 6–20)
CHLORIDE: 100 mmol/L — AB (ref 101–111)
CO2: 34 mmol/L — ABNORMAL HIGH (ref 22–32)
Calcium: 8.9 mg/dL (ref 8.9–10.3)
Creatinine, Ser: 0.69 mg/dL (ref 0.44–1.00)
Glucose, Bld: 347 mg/dL — ABNORMAL HIGH (ref 65–99)
POTASSIUM: 3.5 mmol/L (ref 3.5–5.1)
SODIUM: 145 mmol/L (ref 135–145)

## 2016-01-02 LAB — MAGNESIUM: Magnesium: 2 mg/dL (ref 1.7–2.4)

## 2016-01-02 LAB — CBC
HEMATOCRIT: 30.8 % — AB (ref 36.0–46.0)
Hemoglobin: 8.9 g/dL — ABNORMAL LOW (ref 12.0–15.0)
MCH: 23.6 pg — ABNORMAL LOW (ref 26.0–34.0)
MCHC: 28.9 g/dL — ABNORMAL LOW (ref 30.0–36.0)
MCV: 81.7 fL (ref 78.0–100.0)
PLATELETS: 200 10*3/uL (ref 150–400)
RBC: 3.77 MIL/uL — AB (ref 3.87–5.11)
RDW: 16.7 % — AB (ref 11.5–15.5)
WBC: 10.2 10*3/uL (ref 4.0–10.5)

## 2016-01-02 LAB — PHOSPHORUS: PHOSPHORUS: 2.6 mg/dL (ref 2.5–4.6)

## 2016-01-02 LAB — TSH: TSH: 6.475 u[IU]/mL — ABNORMAL HIGH (ref 0.350–4.500)

## 2016-01-02 MED ORDER — SODIUM CHLORIDE 0.9 % IV SOLN
INTRAVENOUS | Status: DC
  Administered 2016-01-02: 05:00:00 via INTRAVENOUS

## 2016-01-02 MED ORDER — POTASSIUM CHLORIDE 20 MEQ/15ML (10%) PO SOLN
20.0000 meq | ORAL | Status: AC
  Administered 2016-01-02 (×2): 20 meq
  Filled 2016-01-02 (×2): qty 15

## 2016-01-02 MED ORDER — INSULIN GLARGINE 100 UNIT/ML ~~LOC~~ SOLN
40.0000 [IU] | Freq: Every day | SUBCUTANEOUS | Status: DC
  Filled 2016-01-02: qty 0.4

## 2016-01-02 MED ORDER — ACETAZOLAMIDE SODIUM 500 MG IJ SOLR
250.0000 mg | Freq: Four times a day (QID) | INTRAMUSCULAR | Status: AC
  Administered 2016-01-02 – 2016-01-03 (×2): 250 mg via INTRAVENOUS
  Filled 2016-01-02 (×3): qty 250

## 2016-01-02 MED ORDER — FUROSEMIDE 10 MG/ML IJ SOLN
40.0000 mg | Freq: Four times a day (QID) | INTRAMUSCULAR | Status: AC
  Administered 2016-01-02 – 2016-01-03 (×3): 40 mg via INTRAVENOUS
  Filled 2016-01-02 (×3): qty 4

## 2016-01-02 MED ORDER — INSULIN GLARGINE 100 UNIT/ML ~~LOC~~ SOLN
50.0000 [IU] | Freq: Every day | SUBCUTANEOUS | Status: DC
Start: 2016-01-02 — End: 2016-01-07
  Administered 2016-01-02 – 2016-01-06 (×5): 50 [IU] via SUBCUTANEOUS
  Filled 2016-01-02 (×7): qty 0.5

## 2016-01-02 NOTE — Progress Notes (Signed)
Subjective: Continues to be more awake  Exam: Filed Vitals:   01/02/16 0935 01/02/16 1000  BP:    Pulse: 61 73  Temp:    Resp: 18 16   Gen: In bed, NAD Resp: non-labored breathing, no acute distress Abd: soft, nt  Neuro: MS: Awake, alert, follows commands and answers questions with nods/shakes CN: Pupils were unreactive, tracks across midline both directions Motor: Follows commands in all 4 strategies Sensory: Endorses sensation in all 4 extremities   Impression: 55 year old female slow to wake up following sedation and paralytics. I suspect that this may be some prolonged sedative affect from propofol given that it is fat soluble, also possible would be that this is compensated by the fact that she is hypothyroid, though is currently being treated with replacement.  Given continued improvement, I suspect this is metabolic, if she does not continue to progress as expected, then please call.   Recommendations: 1) thyroid per IM 2) Neurology will sign off, please call with further questions.   Ritta SlotMcNeill Kirkpatrick, MD [facility] [phone]  If 7pm- 7am, please page neurology on call as listed in AMION.

## 2016-01-02 NOTE — Progress Notes (Signed)
Nutrition Follow-up  DOCUMENTATION CODES:   Morbid obesity  INTERVENTION:   Continue: Vital High Protein @ 35 ml/hr via Cortrak 30 ml Prostat TID Provides: 1160 kcal, 118 grams protein, and 702 ml H2O.  Total free water: 1702 ml  NUTRITION DIAGNOSIS:   Inadequate oral intake related to inability to eat as evidenced by NPO status. Ongoing.   GOAL:   Provide needs based on ASPEN/SCCM guidelines Met.   MONITOR:   Skin, Vent status, Labs, Weight trends, TF tolerance  ASSESSMENT:   55 yo female presented with dyspnea and b/l leg pain/swelling after stopping outpt lasix. She was being tx for CHF and COPD exacerbations. She developed worsening hypoxia with b/l pulmonary infiltrates 4/03 after having episode of emesis with concern for aspiration and ARDS.  Patient is currently intubated on ventilator support MV: 5.7 L/min Temp (24hrs), Avg:98.9 F (37.2 C), Min:98.7 F (37.1 C), Max:99.2 F (37.3 C)  Free water: 250 ml every 6 hours = 1000 ml Medications reviewed and include: MVI, prednisone  Labs reviewed. CBG's: 185-235 Cortrak: tip at ligament of Treitz  Diet Order:    NPO  Skin:  Wound (see comment) (MASD: breast, groin; bilateral lower extremitiy DM ulcers)  Last BM:  4/9: 650 ml  4/10: 1100 ml  Height:   Ht Readings from Last 1 Encounters:  12/25/15 5' (1.524 m)    Weight:   Wt Readings from Last 1 Encounters:  01/02/16 236 lb 5.3 oz (107.2 kg)    Ideal Body Weight:  45.4 kg  BMI:  Body mass index is 46.16 kg/(m^2).  Estimated Nutritional Needs:   Kcal:  859-2924  Protein:  >/= 113 grams  Fluid:  > 1.5 L/day  EDUCATION NEEDS:   No education needs identified at this time  [place], [place], [place] Pager [phone] After Hours Pager

## 2016-01-02 NOTE — Progress Notes (Addendum)
eLink Physician-Brief Progress Note Patient Name: Evaristo BuryMary J Freehling DOB: 1961-04-19 MRN: [id number]   Date of Service  01/02/2016  HPI/Events of Note  CBG (last 3)   Recent Labs  01/01/16 1955 01/02/16 0027 01/02/16 0409  GLUCAP 258* 328* 319*      eICU Interventions  Will increase lantus and continue resistant SSI scale >> if still high, then might need insulin gtt.  Will also d/c dextrose from IV fluid.     Intervention Category Major Interventions: Other:  Jazmine Longshore 01/02/2016, 4:47 AM

## 2016-01-02 NOTE — Progress Notes (Signed)
Inpatient Diabetes Program Recommendations  AACE/ADA: New Consensus Statement on Inpatient Glycemic Control (2015)  Target Ranges:  Prepandial:   less than 140 mg/dL      Peak postprandial:   less than 180 mg/dL (1-2 hours)      Critically ill patients:  140 - 180 mg/dL   Review of Glycemic ControlResults for Elizabeth BuryMCBRIDE, Makaia J (MRN [id number]) as of 01/02/2016 11:22  Ref. Range 01/01/2016 16:09 01/01/2016 19:55 01/02/2016 00:27 01/02/2016 04:09 01/02/2016 08:10  Glucose-Capillary Latest Ref Range: 65-99 mg/dL 409203 (H) 811258 (H) 914328 (H) 319 (H) 235 (H)    Inpatient Diabetes Program Recommendations: May consider adding Novolog tube feed coverage 3 units q 4 hours.  Thanks, Beryl MeagerJenny Natarsha Hurwitz, RN, BC-ADM Inpatient Diabetes Coordinator Pager [phone] (8a-5p)

## 2016-01-02 NOTE — Progress Notes (Signed)
PULMONARY / CRITICAL CARE MEDICINE   Name: FRANKIE ZITO MRN: [id number] DOB: 03/02/1961    ADMISSION DATE:  12/22/2015  SUBJECTIVE:  Shortness of breath  INTERVAL HISTORY:  RN reports no purposeful movement during WUA.  During my exam she seemed to wiggles her toes on command but I could not get her to do this again and she did not follow any other commands.  Allergies  Allergen Reactions  . Trazodone And Nefazodone Anaphylaxis and Other (See Comments)    Shortness of breath.  . Celecoxib Nausea Only  . Ibuprofen Nausea Only    No current facility-administered medications on file prior to encounter.   Current Outpatient Prescriptions on File Prior to Encounter  Medication Sig  . albuterol (PROAIR HFA) 108 (90 BASE) MCG/ACT inhaler Inhale 2 puffs into the lungs every 6 (six) hours. scheduled  . ALPRAZolam (XANAX) 0.5 MG tablet Take 1 tablet (0.5 mg total) by mouth at bedtime as needed for anxiety. (Patient taking differently: Take 0.5 mg by mouth 2 (two) times daily. )  . ARIPiprazole (ABILIFY) 5 MG tablet Take 5 mg by mouth daily.  Marland Kitchen aspirin EC 81 MG tablet Take 1 tablet (81 mg total) by mouth daily.  . cetirizine (ZYRTEC) 10 MG tablet Take 10 mg by mouth daily.   . furosemide (LASIX) 40 MG tablet Take 1 tablet (40 mg total) by mouth daily.  Marland Kitchen gabapentin (NEURONTIN) 100 MG capsule Take 1 capsule (100 mg total) by mouth 3 (three) times daily.  Marland Kitchen glimepiride (AMARYL) 4 MG tablet Take 4 mg by mouth daily.  . insulin aspart (NOVOLOG FLEXPEN) 100 UNIT/ML FlexPen Inject 6-15 Units into the skin 3 (three) times daily as needed for high blood sugar (CBG >100). CBG 100-200 6-8 units, >200 15 units  . insulin glargine (LANTUS) 100 UNIT/ML injection Inject 0.6 mLs (60 Units total) into the skin daily. (Patient taking differently: Inject 48 Units into the skin at bedtime. )  . levothyroxine (SYNTHROID, LEVOTHROID) 125 MCG tablet Take 1 tablet (125 mcg total) by mouth daily before breakfast.   . losartan (COZAAR) 100 MG tablet Take 1 tablet (100 mg total) by mouth daily.  . metoprolol tartrate (LOPRESSOR) 25 MG tablet Take 0.5 tablets (12.5 mg total) by mouth 2 (two) times daily.  Marland Kitchen omeprazole (PRILOSEC) 20 MG capsule Take 20 mg by mouth daily.  . potassium chloride SA (K-DUR,KLOR-CON) 20 MEQ tablet Take 1 tablet (20 mEq total) by mouth daily.  . simvastatin (ZOCOR) 10 MG tablet Take 10 mg by mouth daily.    VITAL SIGNS: BP 136/57 mmHg  Pulse 73  Temp(Src) 99 F (37.2 C) (Oral)  Resp 16  Ht 5' (1.524 m)  Wt 107.2 kg (236 lb 5.3 oz)  BMI 46.16 kg/m2  SpO2 100%  HEMODYNAMICS:    VENTILATOR SETTINGS: Vent Mode:  [-] PRVC FiO2 (%):  [40 %] 40 % Set Rate:  [18 bmp] 18 bmp Vt Set:  [420 mL-4220 mL] 4220 mL PEEP:  [5 cmH20] 5 cmH20 Plateau Pressure:  [28 cmH20-33 cmH20] 31 cmH20  INTAKE / OUTPUT: I/O last 3 completed shifts: In: 5146.4 [I.V.:1823.9; Other:240; GU/RK:2706; IV Piggyback:212.5] Out: 2376 [EGBTD:1761; YWVPX:1062]  PHYSICAL EXAMINATION: General: lightly sedated, non-purposeful movement of arms during exam, ?wiggled toes on command HEENT: ETT, NG tube Cardiac: RRR, no rubs, murmurs or gallops Pulm: vent supported, coarse BS anteriorly Abd: soft, non-distended, BS present, 268m of loose brown stool in rectal bag Ext: SCDs in place, warm and well perfused, 2+ B/L  LE edema (to thighs) Neuro: RASS - 1  LABS:  BMET  Recent Labs Lab 01/01/16 0512 01/01/16 2110 01/02/16 0415  NA 149* 148* 145  K 2.8* 4.1 3.5  CL 103 104 100*  CO2 35* 34* 34*  BUN 39* 31* 30*  CREATININE 0.72 0.67 0.69  GLUCOSE 188* 343* 347*    Electrolytes  Recent Labs Lab 12/27/15 0333  12/30/15 0415  01/01/16 0512 01/01/16 2110 01/02/16 0415  CALCIUM 9.0  < > 8.6*  < > 8.6* 9.1 8.9  MG 1.7  --  2.2  --   --   --  2.0  PHOS 2.8  --   --   --   --   --  2.6  < > = values in this interval not displayed.  CBC  Recent Labs Lab 12/31/15 0750 01/01/16 0512  01/02/16 0415  WBC 8.9 8.8 10.2  HGB 8.2* 8.3* 8.9*  HCT 28.6* 28.6* 30.8*  PLT 246 240 200   Sepsis Markers  Recent Labs Lab 12/27/15 0333 12/28/15 0515  PROCALCITON 1.11 0.80    ABG  Recent Labs Lab 12/28/15 0227 12/30/15 0443 01/02/16 0330  PHART 7.353 7.442 7.522*  PCO2ART 63.6* 53.5* 42.6  PO2ART 95.0 124.0* 72.9*    Glucose  Recent Labs Lab 01/01/16 1204 01/01/16 1609 01/01/16 1955 01/02/16 0027 01/02/16 0409 01/02/16 0810  GLUCAP 155* 203* 258* 328* 319* 235*    Imaging Ct Head Wo Contrast  01/01/2016  CLINICAL DATA:  Acute encephalopathy. EXAM: CT HEAD WITHOUT CONTRAST TECHNIQUE: Contiguous axial images were obtained from the base of the skull through the vertex without intravenous contrast. COMPARISON:  CT scan of October 16, 2015. FINDINGS: Fluid is noted in the mastoid air cells bilaterally. Bony calvarium appears intact. Stable diffuse cortical atrophy is noted. Mild chronic ischemic white matter disease is noted. No mass effect or midline shift is noted. Ventricular size is within normal limits. There is no evidence of mass lesion, hemorrhage or acute infarction. IMPRESSION: Stable diffuse cortical atrophy. Mild chronic ischemic white matter disease. No acute intracranial abnormality seen. Electronically Signed   By: Marijo Conception, M.D.   On: 01/01/2016 15:31   Dg Chest Port 1 View  01/02/2016  CLINICAL DATA:  55 year old female with encephalopathy. Shortness of breath. Respiratory failure. ARDS. Initial encounter. EXAM: PORTABLE CHEST 1 VIEW COMPARISON:  01/01/2016 and earlier. FINDINGS: Portable AP semi upright view at 0515 hours. The endotracheal tube tip is just above the clavicles. Enteric tube courses to the abdomen, tip not included. Stable left IJ approach central line. Multiple EKG wires are looped over the chest. Ventilation has not significantly changed since 12/30/2015. There is dense retrocardiac opacity obscuring the left hemidiaphragm. There  is superimposed patchy perihilar and infrahilar opacity. No pneumothorax. Stable pulmonary vascularity. Small pleural effusions are possible. Stable postoperative changes about both shoulders. Cervical ACDF hardware again noted. IMPRESSION: 1.  Stable lines and tubes. 2. Stable ventilation since 12/30/2015 with bilateral lower lobe collapse or consolidation with superimposed patchy perihilar opacity and vascular congestion. Electronically Signed   By: Genevie Ann M.D.   On: 01/02/2016 07:22   STUDIES:  4/04 Echo >> EF 60 to 41%, grade 2 diastolic CHF, severe MS 2/87 CT head > Stable diffuse cortical atrophy. Mild chronic ischemic white matter disease. No acute intracranial abnormality seen. 4/10 EEG > abnormal with diffuse slowing c/w several metabolic abcormalitites  CULTURES: 4/03 Sputum >> rare yeast c/w candida 4/04 Influenza PCR >> negative 4/04 Respiratory viral  panel >> negative  ANTIBIOTICS: 4/01 Doxycycline >> 4/03 4/03 Vancomycin >> 04/06 4/03 Zosyn >> 04/10  SIGNIFICANT EVENTS: 3/31 Admit 4/03 VDRF >> to ICU, ARDS protocol, start nimbex 4/05 d/c nimbex 4/10 to precedex, depakote added  LINES/TUBES: 04/03 ETT 04/03 Left IJ  04/03 Left femoral A-line  DISCUSSION: 55 year old woman admitted on 03/31 with acute HF exacerbation 2/2 non-compliance with diuretic therapy admitted to ICU on 04/03 for acute hypoxic respiratory failure due to ARDS due to aspiration. [name] slowly impoving and after switch to precedex is able to wake up some and follow simple commands.   ASSESSMENT / PLAN:  PULMONARY A: VDRF - ARDS 2/2 aspiration - improving AECOPD P:   Continue steroid and scheduled duonebs. Lasix '40mg'$  IV q6 x3 doses again today Daily SBT > tolerating well as of now, will monitor AM CXR. Follow CXR  CARDIOVASCULAR A:  Acute on chronic diastolic HF - net neg 086VH since admission.  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 HLD  P:  Continue home BB.  Can likely resume ACEI soon. Strict I&Os  Continue ASA and statin.  RENAL A:   AKI - resolved Hypokalemia - repleted Hypernatremia  Contraction alk  P:   Monitor BMP. Diuresing as above, will add acetazolamide '250mg'$  q6 x 3 doses Free water. D5W at 50 ml/hr x20 hours. Replace electrolytes as indicated.  GASTROINTESTINAL A:   Loose stools - improving Nutrition  Risk for stress ulcer. P:   Continue rectal tube. Continue TF while NPO. Continue PPI for stress ulcer ppx.  HEMATOLOGIC A:   Leukocytosis 2/2 to infection and steroids - resolved Anemia of critical illness in the setting of AoCD - stable Risk for VTE. P:  Monitor CBC.   Continue [place] lovenox for VTE ppx.  INFECTIOUS A:   Aspiration PNA - has completed abx course P:   Off ABX   ENDOCRINE A:   DM type 2 - CBGs 95-273 in past 24 hours; AM CBG 95 Hypothyroidism  P:   Continue SSI-R INCREASE Lantus to 50units QHS Continue prednisone '10mg'$  daily. Continue Levothyroxine, repeat TSH  NEUROLOGIC A:   Acute encephalopathy.> improving Bipolar disorder, anxiety Peripheral neuropathy  P:   RASS goal: 0 to -1 Now on precedex Continue Abilify, prn Versed. Depakote Continue to hold outpatient Xanax (getting prn Versed) and Neurontin (given encephalopathy). Appreciate neurology rec's  FAMILY   - Updates: Unable to reach husband by phone yesterday.  Need to try and contact today.   Georgann Housekeeper, AGACNP-BC [facility] Pager [phone] or [phone]  01/02/2016 11:20 AM  Attending Note:  55 year old female with aspiration, ARDS and pulmonary edema. Mental status poor but improving. Likely accumulation of sedative on top of her illness. Will continue aggressive diureses. Add acetazolamide for lasix induced alkalosis and additional volume removal. Minimize sedation. And will continue with SBTs for now with no extubation  given mental status. Recheck TSH (likely will remain high, has only been on synthroid for a week) but will recheck. On exam, she is more interactive but remains lethargic. Will continue current care.  The patient is critically ill with multiple organ systems failure and requires high complexity decision making for assessment and support, frequent evaluation and titration of therapies, application of advanced monitoring technologies and extensive interpretation of multiple databases.   Critical Care Time devoted to patient care services described in this note is 49  Minutes. This time reflects time of care of this signee Dr Jennet Maduro. This critical care time does not reflect procedure time, or teaching time or supervisory time of PA/NP/Med student/Med Resident etc but could involve care discussion time.  Rush Farmer, M.D. [facility] Pulmonary/Critical Care Medicine. Pager: [phone]. After hours pager: [phone].

## 2016-01-02 NOTE — Progress Notes (Signed)
[facility] FOR [facility] LAB REPLACEMENT ONLY  The patient does apply for the [facility] Electrolyte [facility] Protocol based on the criteria listed below:   1. Is GFR >/= 40 ml/min? Yes.    Patient's GFR today is >60 2. Is urine output >/= 0.5 ml/kg/hr for the last 6 hours? Yes.   Patient's UOP is 1.32 ml/kg/hr 3. Is BUN < 60 mg/dL? Yes.    Patient's BUN today is 30 4. Abnormal electrolyte(s): Potassium 3.5 5. Ordered repletion with: Potassium per protocol 6. If a panic level lab has been reported, has the CCM MD in charge been notified? No..   Physician:    Thomasenia BottomsLANTZY, Margreat Widener P 01/02/2016 5:51 AM

## 2016-01-03 DIAGNOSIS — J810 Acute pulmonary edema: Secondary | ICD-10-CM

## 2016-01-03 LAB — GLUCOSE, CAPILLARY
GLUCOSE-CAPILLARY: 120 mg/dL — AB (ref 65–99)
GLUCOSE-CAPILLARY: 188 mg/dL — AB (ref 65–99)
GLUCOSE-CAPILLARY: 240 mg/dL — AB (ref 65–99)
Glucose-Capillary: 142 mg/dL — ABNORMAL HIGH (ref 65–99)
Glucose-Capillary: 184 mg/dL — ABNORMAL HIGH (ref 65–99)
Glucose-Capillary: 253 mg/dL — ABNORMAL HIGH (ref 65–99)

## 2016-01-03 LAB — BASIC METABOLIC PANEL
Anion gap: 12 (ref 5–15)
BUN: 30 mg/dL — AB (ref 6–20)
CALCIUM: 9.2 mg/dL (ref 8.9–10.3)
CO2: 36 mmol/L — ABNORMAL HIGH (ref 22–32)
CREATININE: 0.62 mg/dL (ref 0.44–1.00)
Chloride: 99 mmol/L — ABNORMAL LOW (ref 101–111)
Glucose, Bld: 152 mg/dL — ABNORMAL HIGH (ref 65–99)
Potassium: 2.8 mmol/L — ABNORMAL LOW (ref 3.5–5.1)
SODIUM: 147 mmol/L — AB (ref 135–145)

## 2016-01-03 LAB — CBC
HCT: 27.5 % — ABNORMAL LOW (ref 36.0–46.0)
Hemoglobin: 8.4 g/dL — ABNORMAL LOW (ref 12.0–15.0)
MCH: 24.9 pg — ABNORMAL LOW (ref 26.0–34.0)
MCHC: 30.5 g/dL (ref 30.0–36.0)
MCV: 81.6 fL (ref 78.0–100.0)
PLATELETS: 199 10*3/uL (ref 150–400)
RBC: 3.37 MIL/uL — AB (ref 3.87–5.11)
RDW: 17 % — AB (ref 11.5–15.5)
WBC: 12.3 10*3/uL — AB (ref 4.0–10.5)

## 2016-01-03 LAB — TRIGLYCERIDES: Triglycerides: 119 mg/dL (ref <150)

## 2016-01-03 LAB — PHOSPHORUS: PHOSPHORUS: 2.9 mg/dL (ref 2.5–4.6)

## 2016-01-03 LAB — MAGNESIUM: MAGNESIUM: 1.8 mg/dL (ref 1.7–2.4)

## 2016-01-03 MED ORDER — POTASSIUM CHLORIDE 10 MEQ/50ML IV SOLN
10.0000 meq | INTRAVENOUS | Status: AC
  Administered 2016-01-03 (×4): 10 meq via INTRAVENOUS
  Filled 2016-01-03 (×4): qty 50

## 2016-01-03 MED ORDER — POTASSIUM CHLORIDE 20 MEQ/15ML (10%) PO SOLN
40.0000 meq | Freq: Once | ORAL | Status: AC
  Administered 2016-01-03: 40 meq
  Filled 2016-01-03: qty 30

## 2016-01-03 MED ORDER — FUROSEMIDE 10 MG/ML IJ SOLN
40.0000 mg | Freq: Four times a day (QID) | INTRAMUSCULAR | Status: AC
  Administered 2016-01-03 (×3): 40 mg via INTRAVENOUS
  Filled 2016-01-03 (×3): qty 4

## 2016-01-03 NOTE — Progress Notes (Signed)
PULMONARY / CRITICAL CARE MEDICINE   Name: [name] MRN: [id number] DOB: 1961/08/05    ADMISSION DATE:  12/22/2015  SUBJECTIVE:  Shortness of breath  INTERVAL HISTORY:  Follows commands but no resp effort on SBT  VITAL SIGNS: BP 145/71 mmHg  Pulse 77  Temp(Src) 99.3 F (37.4 C) (Oral)  Resp 21  Ht 5' (1.524 m)  Wt 220 lb 14.4 oz (100.2 kg)  BMI 43.14 kg/m2  SpO2 100%  HEMODYNAMICS:    VENTILATOR SETTINGS: Vent Mode:  [-] PRVC FiO2 (%):  [40 %] 40 % Set Rate:  [18 bmp] 18 bmp Vt Set:  [420 mL] 420 mL PEEP:  [5 cmH20] 5 cmH20 Pressure Support:  [10 cmH20] 10 cmH20 Plateau Pressure:  [22 cmH20-30 cmH20] 25 cmH20  INTAKE / OUTPUT: I/O last 3 completed shifts: In: 4258.5 [I.V.:983.5; Other:40; [place]/HC:6237; IV Piggyback:50] Out: 6283 [TDVVO:1607; PXTGG:2694]  PHYSICAL EXAMINATION: General: lightly sedated, non-purposeful movement of arms during exam, ?wiggled toes on command HEENT: ETT, NG tube Cardiac: RRR, no rubs, murmurs or gallops Pulm: vent supported, coarse BS anterior  with rul exp wheeze Abd: soft, non-distended, BS present, 245m of loose brown stool in rectal bag Ext: SCDs in place, warm and well perfused, 2+ B/L LE edema (to thighs) Neuro: Rass 0 follows commands  LABS:  BMET  Recent Labs Lab 01/01/16 2110 01/02/16 0415 01/03/16 0455  NA 148* 145 147*  K 4.1 3.5 2.8*  CL 104 100* 99*  CO2 34* 34* 36*  BUN 31* 30* 30*  CREATININE 0.67 0.69 0.62  GLUCOSE 343* 347* 152*    Electrolytes  Recent Labs Lab 12/30/15 0415  01/01/16 2110 01/02/16 0415 01/03/16 0455  CALCIUM 8.6*  < > 9.1 8.9 9.2  MG 2.2  --   --  2.0 1.8  PHOS  --   --   --  2.6 2.9  < > = values in this interval not displayed.  CBC  Recent Labs Lab 01/01/16 0512 01/02/16 0415 01/03/16 0455  WBC 8.8 10.2 12.3*  HGB 8.3* 8.9* 8.4*  HCT 28.6* 30.8* 27.5*  PLT 240 200 199   Sepsis Markers  Recent Labs Lab 12/28/15 0515  PROCALCITON 0.80    ABG  Recent  Labs Lab 12/28/15 0227 12/30/15 0443 01/02/16 0330  PHART 7.353 7.442 7.522*  PCO2ART 63.6* 53.5* 42.6  PO2ART 95.0 124.0* 72.9*    Glucose  Recent Labs Lab 01/02/16 1234 01/02/16 1704 01/02/16 2010 01/03/16 0039 01/03/16 0457 01/03/16 0816  GLUCAP 184* 317* 305* 184* 142* 120*    Imaging No results found. STUDIES:  4/04 Echo >> EF 60 to 685% grade 2 diastolic CHF, severe MS 44/62CT head > Stable diffuse cortical atrophy. Mild chronic ischemic white matter disease. No acute intracranial abnormality seen. 4/10 EEG > abnormal with diffuse slowing c/w several metabolic abcormalitites  CULTURES: 4/03 Sputum >> rare yeast c/w candida 4/04 Influenza PCR >> negative 4/04 Respiratory viral panel >> negative  ANTIBIOTICS: 4/01 Doxycycline >> 4/03 4/03 Vancomycin >> 04/06 4/03 Zosyn >> 04/10  SIGNIFICANT EVENTS: 3/31 Admit 4/03 VDRF >> to ICU, ARDS protocol, start nimbex 4/05 d/c nimbex 4/10 to precedex, depakote added  LINES/TUBES: 04/03 ETT>> 04/03 Left IJ >> 04/03 Left femoral A-line>>out  DISCUSSION: 55year old woman admitted on 03/31 with acute HF exacerbation 2/2 non-compliance with diuretic therapy admitted to ICU on 04/03 for acute hypoxic respiratory failure due to ARDS due to aspiration. [name] slowly impoving and after switch to precedex is able  to wake up some and follow simple commands.   ASSESSMENT / PLAN:  PULMONARY A: VDRF - ARDS 2/2 aspiration - improving AECOPD P:   Continue steroid and scheduled duonebs. Lasix '40mg'$  IV q6 x3 doses again today, will need K+ supplement Daily SBT > tolerating well as of now, will monitor, 4/12 failed AM CXR. 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 HLD  P:  Continue home BB.  Can likely resume ACEI soon. Strict I&Os  Continue ASA and statin.  RENAL Lab  Results  Component Value Date   CREATININE 0.62 01/03/2016   CREATININE 0.69 01/02/2016   CREATININE 0.67 01/01/2016    Recent Labs Lab 01/01/16 2110 01/02/16 0415 01/03/16 0455  NA 148* 145 147*    A:   AKI - resolved Hypokalemia - repleteing Hypernatremia  On free h20 Contraction alk  P:   Monitor BMP. Diuresing as above, will add acetazolamide '250mg'$  q6 x 3 doses Free water. D5W at 50 ml/hr x20 hours. Replace electrolytes as indicated.  GASTROINTESTINAL A:   Loose stools - improving Nutrition  Risk for stress ulcer. P:   Continue rectal tube. Continue TF while NPO. Continue PPI for stress ulcer ppx.  HEMATOLOGIC A:   Leukocytosis 2/2 to infection and steroids - resolved Anemia of critical illness in the setting of AoCD - stable Risk for VTE. P:  Monitor CBC.   Continue [place] lovenox for VTE ppx.  INFECTIOUS A:   Aspiration PNA - has completed abx course P:   Off ABX   ENDOCRINE CBG (last 3)   Recent Labs  01/03/16 0039 01/03/16 0457 01/03/16 0816  GLUCAP 184* 142* 120*     A:   DM type 2 -  Hypothyroidism  P:   Continue SSI-R  Lantus to 50units QHS Continue prednisone '10mg'$  daily. Continue Levothyroxine, repeat TSH 6.475  NEUROLOGIC A:   Acute encephalopathy.> improving, follows comands Bipolar disorder, anxiety Peripheral neuropathy  P:   RASS goal: 0 to -1 Now on precedex Continue Abilify, prn Versed. Depakote Continue to hold outpatient Xanax (getting prn Versed) and Neurontin (given encephalopathy). Appreciate neurology rec's  FAMILY   - Updates: No family at bedside.   Richardson Landry Minor ACNP Maryanna Shape PCCM Pager [phone] till 3 pm If no answer page [phone] 01/03/2016, 9:56 AM  Attending Note:  55 year old female with acute pulmonary edema due to non-compliance with diuretics, was intubated.  Diuresing, renal function is holding.  Will continue diureses for now and monitor UOP and renal function.  Begin PS trials.  On exam,  more awake and following commands but periods of apnea.  Will minimize sedation.  Will extubate today and observe, if fails will reintubate.  The patient is critically ill with multiple organ systems failure and requires high complexity decision making for assessment and support, frequent evaluation and titration of therapies, application of advanced monitoring technologies and extensive interpretation of multiple databases.   Critical Care Time devoted to patient care services described in this note is  35  Minutes. This time reflects time of care of this signee Dr [name]. This critical care time does not reflect procedure time, or teaching time or supervisory time of PA/NP/Med student/Med Resident etc but could involve care discussion time.  Rush Farmer, M.D. [facility] Pulmonary/Critical Care Medicine. Pager: [phone]. After hours pager: [phone].

## 2016-01-03 NOTE — Progress Notes (Signed)
eLink Physician-Brief Progress Note Patient Name: Evaristo BuryMary J Hott DOB: October 19, 1960 MRN: [id number]   Date of Service  01/03/2016  HPI/Events of Note  K+ = 2.8 and Creatinine = 0.62.  eICU Interventions  Will replete K+.     Intervention Category Intermediate Interventions: Electrolyte abnormality - evaluation and management  Sharnelle Cappelli Eugene 01/03/2016, 6:20 AM

## 2016-01-03 NOTE — Progress Notes (Signed)
PT Cancellation Note  Patient Details Name: [name] MRN: [id number] DOB: March 10, 1961   Cancelled Treatment:    Reason Eval/Treat Not Completed: Medical issues which prohibited therapy (pt to be extubated today.  Will check on pt in am. )Thanks.   Tawni MillersWhite, Hallee Mckenny F 01/03/2016, 10:48 AM Eber Jonesawn Naol Ontiveros,PT Acute Rehabilitation [phone] (pager)

## 2016-01-03 NOTE — Procedures (Signed)
Extubation Procedure Note  Patient Details:   Name: Evaristo BuryMary J Amparan DOB: 11/08/1960 MRN: [id number]   Airway Documentation:     Evaluation  O2 sats: stable throughout Complications: No apparent complications Patient did tolerate procedure well. Bilateral Breath Sounds: Rhonchi, Inspiratory wheezes   Yes  PT was extubated to a 4L [place]  Sats are stable and Pt was able to speak  Katherina Wimer, Duane LopeJeffrey D 01/03/2016, 11:06 AM

## 2016-01-04 ENCOUNTER — Telehealth: Payer: Self-pay

## 2016-01-04 ENCOUNTER — Inpatient Hospital Stay (HOSPITAL_COMMUNITY): Payer: Medicaid Other

## 2016-01-04 LAB — GLUCOSE, CAPILLARY
GLUCOSE-CAPILLARY: 121 mg/dL — AB (ref 65–99)
GLUCOSE-CAPILLARY: 122 mg/dL — AB (ref 65–99)
GLUCOSE-CAPILLARY: 142 mg/dL — AB (ref 65–99)
GLUCOSE-CAPILLARY: 285 mg/dL — AB (ref 65–99)
Glucose-Capillary: 242 mg/dL — ABNORMAL HIGH (ref 65–99)
Glucose-Capillary: 345 mg/dL — ABNORMAL HIGH (ref 65–99)

## 2016-01-04 LAB — BASIC METABOLIC PANEL
ANION GAP: 9 (ref 5–15)
BUN: 29 mg/dL — AB (ref 6–20)
CHLORIDE: 99 mmol/L — AB (ref 101–111)
CO2: 39 mmol/L — AB (ref 22–32)
Calcium: 9 mg/dL (ref 8.9–10.3)
Creatinine, Ser: 0.64 mg/dL (ref 0.44–1.00)
GFR calc Af Amer: >60 mL/min (ref >60)
GLUCOSE: 121 mg/dL — AB (ref 65–99)
POTASSIUM: 3.1 mmol/L — AB (ref 3.5–5.1)
Sodium: 147 mmol/L — ABNORMAL HIGH (ref 135–145)

## 2016-01-04 LAB — PHOSPHORUS: Phosphorus: 4 mg/dL (ref 2.5–4.6)

## 2016-01-04 LAB — CBC
HEMATOCRIT: 28.4 % — AB (ref 36.0–46.0)
Hemoglobin: 8.1 g/dL — ABNORMAL LOW (ref 12.0–15.0)
MCH: 23.7 pg — AB (ref 26.0–34.0)
MCHC: 28.5 g/dL — ABNORMAL LOW (ref 30.0–36.0)
MCV: 83 fL (ref 78.0–100.0)
Platelets: 200 10*3/uL (ref 150–400)
RBC: 3.42 MIL/uL — AB (ref 3.87–5.11)
RDW: 17 % — ABNORMAL HIGH (ref 11.5–15.5)
WBC: 10.7 10*3/uL — AB (ref 4.0–10.5)

## 2016-01-04 LAB — MAGNESIUM: Magnesium: 2 mg/dL (ref 1.7–2.4)

## 2016-01-04 MED ORDER — JEVITY 1.2 CAL PO LIQD
1000.0000 mL | ORAL | Status: DC
Start: 2016-01-04 — End: 2016-01-05
  Administered 2016-01-04: 1000 mL
  Filled 2016-01-04 (×3): qty 1000

## 2016-01-04 MED ORDER — IPRATROPIUM-ALBUTEROL 0.5-2.5 (3) MG/3ML IN SOLN
3.0000 mL | Freq: Two times a day (BID) | RESPIRATORY_TRACT | Status: DC
  Administered 2016-01-05 (×2): 3 mL via RESPIRATORY_TRACT
  Filled 2016-01-04 (×3): qty 3

## 2016-01-04 MED ORDER — POTASSIUM CHLORIDE 20 MEQ/15ML (10%) PO SOLN
40.0000 meq | Freq: Three times a day (TID) | ORAL | Status: AC
  Administered 2016-01-04 (×2): 40 meq
  Filled 2016-01-04 (×2): qty 30

## 2016-01-04 MED ORDER — POTASSIUM CHLORIDE 10 MEQ/50ML IV SOLN
10.0000 meq | INTRAVENOUS | Status: AC
  Administered 2016-01-04 (×4): 10 meq via INTRAVENOUS
  Filled 2016-01-04 (×4): qty 50

## 2016-01-04 MED ORDER — FUROSEMIDE 10 MG/ML IJ SOLN
40.0000 mg | Freq: Four times a day (QID) | INTRAMUSCULAR | Status: AC
  Administered 2016-01-04 (×3): 40 mg via INTRAVENOUS
  Filled 2016-01-04 (×3): qty 4

## 2016-01-04 NOTE — Progress Notes (Signed)
PULMONARY / CRITICAL CARE MEDICINE   Name: [name] MRN: [id number] DOB: [birth year]    ADMISSION DATE:  12/22/2015  SUBJECTIVE:  Feels better no new complaints.  INTERVAL HISTORY:  Follows commands but no resp effort on SBT  VITAL SIGNS: BP 91/41 mmHg  Pulse 68  Temp(Src) 98 F (36.7 C) (Oral)  Resp 18  Ht 5' (1.524 m)  Wt 97.8 kg (215 lb 9.8 oz)  BMI 42.11 kg/m2  SpO2 98%  HEMODYNAMICS:    VENTILATOR SETTINGS: Vent Mode:  [-]  FiO2 (%):  [40 %] 40 %  INTAKE / OUTPUT: I/O last 3 completed shifts: In: 2980 [I.V.:360; NG/GT:2420; IV Piggyback:200] Out: 1607 [PXTGG:2694; Stool:350]  PHYSICAL EXAMINATION: General: Alert and interactive, moving all ext to command. HEENT: ETT, NG tube. Cardiac: RRR, no rubs, murmurs or gallops. Pulm: Coarse BS bilaterally. Abd: Soft, non-distended, BS present. Ext: SCDs in place, warm and well perfused, 2+ B/L LE edema (to thighs) Neuro: Rass 0 follows commands  LABS:  BMET  Recent Labs Lab 01/02/16 0415 01/03/16 0455 01/04/16 0408  NA 145 147* 147*  K 3.5 2.8* 3.1*  CL 100* 99* 99*  CO2 34* 36* 39*  BUN 30* 30* 29*  CREATININE 0.69 0.62 0.64  GLUCOSE 347* 152* 121*   Electrolytes  Recent Labs Lab 01/02/16 0415 01/03/16 0455 01/04/16 0408  CALCIUM 8.9 9.2 9.0  MG 2.0 1.8 2.0  PHOS 2.6 2.9 4.0   CBC  Recent Labs Lab 01/02/16 0415 01/03/16 0455 01/04/16 0408  WBC 10.2 12.3* 10.7*  HGB 8.9* 8.4* 8.1*  HCT 30.8* 27.5* 28.4*  PLT 200 199 200   Sepsis Markers No results for input(s): LATICACIDVEN, PROCALCITON, O2SATVEN in the last 168 hours.  ABG  Recent Labs Lab 12/30/15 0443 01/02/16 0330  PHART 7.442 7.522*  PCO2ART 53.5* 42.6  PO2ART 124.0* 72.9*   Glucose  Recent Labs Lab 01/03/16 1138 01/03/16 1638 01/03/16 1952 01/04/16 0021 01/04/16 0418 01/04/16 0725  GLUCAP 253* 188* 240* 142* 122* 121*    Imaging Dg Chest Port 1 View  01/04/2016  CLINICAL DATA:  Respiratory failure.  EXAM: PORTABLE CHEST 1 VIEW COMPARISON:  01/02/2016 . FINDINGS: Feeding tube and and left IJ line in stable position. Cardiomegaly with persistent diffuse bilateral pulmonary interstitial changes consistent congestive heart failure again noted. Basilar atelectasis and/or infiltrates again noted. Small bilateral pleural effusions. No pneumothorax. Prior cervical spine fusion. Postsurgical changes both shoulders again noted . IMPRESSION: 1. Lines and tubes in stable position. 2. Persistent changes of congestive heart failure with bilateral pulmonary interstitial edema. Basilar atelectasis and/or infiltrates again noted . Small bilateral effusions again noted. Electronically Signed   By: Marcello Moores  Register   On: 01/04/2016 07:15   STUDIES:  4/04 Echo >> EF 60 to 85%, grade 2 diastolic CHF, severe MS 4/62 CT head > Stable diffuse cortical atrophy. Mild chronic ischemic white matter disease. No acute intracranial abnormality seen. 4/10 EEG > abnormal with diffuse slowing c/w several metabolic abcormalitites  CULTURES: 4/03 Sputum >> rare yeast c/w candida 4/04 Influenza PCR >> negative 4/04 Respiratory viral panel >> negative  ANTIBIOTICS: 4/01 Doxycycline >> 4/03 4/03 Vancomycin >> 04/06 4/03 Zosyn >> 04/10  SIGNIFICANT EVENTS: 3/31 Admit 4/03 VDRF >> to ICU, ARDS protocol, start nimbex 4/05 d/c nimbex 4/10 to precedex, depakote added  LINES/TUBES: 04/03 ETT>> 04/03 Left IJ >> 04/03 Left femoral A-line>>out  I reviewed CXR myself, residual pulmonary edema.  DISCUSSION: 55 year old woman admitted on 03/31 with acute HF  exacerbation 2/2 non-compliance with diuretic therapy admitted to ICU on 04/03 for acute hypoxic respiratory failure due to ARDS due to aspiration. [name] slowly impoving and after switch to precedex is able to wake up some and follow simple commands.   ASSESSMENT / PLAN:  PULMONARY A: VDRF - ARDS 2/2 aspiration - improving AECOPD P:   Continue steroid and scheduled  duonebs. Lasix '40mg'$  IV q6 x3 doses again today, will need K+ supplement Daily SBT > tolerating well as of now, will monitor, 4/12 failed AM CXR. 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 HLD  P:  Continue home BB.  Can likely resume ACEI soon. Strict I&Os  Continue ASA and statin.  RENAL Lab Results  Component Value Date   CREATININE 0.64 01/04/2016   CREATININE 0.62 01/03/2016   CREATININE 0.69 01/02/2016   Recent Labs Lab 01/02/16 0415 01/03/16 0455 01/04/16 0408  NA 145 147* 147*   A:   AKI - resolved Hypokalemia - repleteing Hypernatremia  On free H2O Contraction alk  P:   Monitor BMP. Diuresing as above, will add acetazolamide '250mg'$  q6 x 3 doses Free water. Replace electrolytes as indicated.  GASTROINTESTINAL A:   Loose stools - improving Nutrition  Risk for stress ulcer. P:   Continue rectal tube. Continue TF. Continue PPI for stress ulcer ppx. Failed swallow evaluation, will repeat in 24-48 hours.  HEMATOLOGIC A:   Leukocytosis 2/2 to infection and steroids - resolved Anemia of critical illness in the setting of AoCD - stable Risk for VTE. P:  Monitor CBC.   Continue [place] lovenox for VTE ppx.  INFECTIOUS A:   Aspiration PNA - has completed abx course P:   Off ABX   ENDOCRINE CBG (last 3)   Recent Labs  01/04/16 0021 01/04/16 0418 01/04/16 0725  GLUCAP 142* 122* 121*   A:   DM type 2 -  Hypothyroidism  P:   Continue SSI-R Lantus to 50 units QHS Continue prednisone '10mg'$  daily. Continue Levothyroxine, repeat TSH 6.475  NEUROLOGIC A:   Acute encephalopathy.> improving, follows comands Bipolar disorder, anxiety Peripheral neuropathy  P:   D/C precedex. Continue Abilify. D/C versed. Depakote Continue to hold outpatient Xanax (getting prn Versed) and Neurontin (given  encephalopathy). Appreciate neurology rec's  FAMILY   - Updates: No family at bedside, patient updated.  Discussed with [facility]-MD, transfer to SDU and to [facility] service with PCCM off 4/14.   Rush Farmer, M.D. [facility] Pulmonary/Critical Care Medicine. Pager: [phone]. After hours pager: [phone].

## 2016-01-04 NOTE — Telephone Encounter (Signed)
Pt 4/26 appt with Lawson FiscalLori moved to 5/8 @ 2pm. Left detailed message on home VM to call back if need to reschedule

## 2016-01-04 NOTE — Progress Notes (Signed)
Nutrition Follow-up  DOCUMENTATION CODES:   Morbid obesity  INTERVENTION:   D/C Vital High Protein  Jevity 1.2 @ 50 ml/hr 30 ml Prostat TID Provides: 1740 kcal, 111 grams protein, and 972 ml H2O.   NUTRITION DIAGNOSIS:   Inadequate oral intake related to inability to eat as evidenced by NPO status. Ongoing.   GOAL:   Patient will meet greater than or equal to 90% of their needs Progressing.   MONITOR:   Diet advancement, Labs, TF tolerance, Skin, I & O's  ASSESSMENT:   55 yo female presented with dyspnea and b/l leg pain/swelling after stopping outpt lasix. She was being tx for CHF and COPD exacerbations. She developed worsening hypoxia with b/l pulmonary infiltrates 4/03 after having episode of emesis with concern for aspiration and ARDS.  Pt awake and alert, continues to be unable to swallow.  4/12 extubated, Cortrak placed, tip at LOT Free water: 250 ml every 6 hours = 1000 ml Labs reviewed: sodium elevated 147, potassium low 3.1 CBG's: 121-242  Diet Order:  Diet NPO time specified  Skin:  Wound (see comment) (MASD breast and groin, bilateral extremity DM ulcers)  Last BM:  4/12: 350 ml via rectal tube  Height:   Ht Readings from Last 1 Encounters:  12/25/15 5' (1.524 m)    Weight:   Wt Readings from Last 1 Encounters:  01/04/16 215 lb 9.8 oz (97.8 kg)    Ideal Body Weight:  45.4 kg  BMI:  Body mass index is 42.11 kg/(m^2).  Estimated Nutritional Needs:   Kcal:  1700-1900  Protein:  95-115 grams  Fluid:  > 1.7 L/day  EDUCATION NEEDS:   No education needs identified at this time  Kendell BaneHeather Envi Eagleson RD, LDN, CNSC [phone] Pager [phone] After Hours Pager
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
## 2016-01-04 NOTE — Progress Notes (Signed)
[facility] FOR [facility] LAB [facility]PLACEMENT ONLY  The patient does [facility] based on the criteria listed below:   1. Is GFR >/= 40 ml/min? Yes.    Patient's GFR today is >60 2. Is urine output >/= 0.5 ml/kg/hr for the last 6 hours? Yes.   Patient's UOP is 0.75 ml/kg/hr 3. Is BUN < 60 mg/dL? Yes.    Patient's BUN today is 29 4. Abnormal electrolyte(s): Potassium 3.1 5. Ordered repletion with: Potassium per protocol 6. If a panic level lab has been reported, has the CCM MD in charge been notified? No..   Physician:    Thomasenia BottomsLANTZY, Shakeisha Horine P 01/04/2016 4:57 AM

## 2016-01-04 NOTE — Evaluation (Signed)
Physical Therapy Evaluation Patient Details Name: [name] MRN: [id number] DOB: [birth year] Today's Date: 01/04/2016   History of Present Illness  55 year old woman admitted on 03/31 with acute HF exacerbation 2/2 non-compliance with diuretic therapy. The patient was doing fairly well with diuresis until 12/25/15 when she developed significant respiratory distress. Her O2 sats were in the 70s on 100% NRB and high flow [place]. She was intolerant of BiPAP and the critical care team was subsequently consulted for intubation. She vomited large volume of emesis during intubation and subsequently had what appeared to be significant aspiration event. Pt intubated from 4/3-4/12  Clinical Impression  Patient seen for reassessment following medical decline. Patient limited to bed level evaluation secondary to deconditioning and poor activity tolerance. Session focused on general LE therapeutic exercise and graded elevation to modified chair position in bed. Patient complains of significant dizziness with elevation to upright, increased anxiety noted. BPs soft 100s/70s but otherwise VSS throughout on 3 liters [place]. Given initial evaluation and overall functional decline, recommend post acute rehabilitation following hospital course, recommend CIR evaluation. Will continue to see and progress as tolerated.    Follow Up Recommendations CIR;Supervision/Assistance - 24 hour    Equipment Recommendations   (TBD)    Recommendations for Other Services       Precautions / Restrictions Precautions Precautions: Fall Precaution Comments: Ng tube, rectal pouch, foley Restrictions Weight Bearing Restrictions: No      Mobility  Bed Mobility Overal bed mobility: Needs Assistance;+2 for physical assistance;+ 2 for safety/equipment   Rolling: Mod assist;+2 for physical assistance   Supine to sit: Max assist;HOB elevated (utilized bed to elevate patient to modified chair position.)     General bed mobility  comments: Max assist +2 to slide up in bed. Therapeutic use of bed to incrementally raise HOM and place patient in modified chair position. VSS but patient complains of significant lightheadedness in coming to upright, could not tolerate large amounts of elevation at one time and required increased time to acclimate.   Transfers                 General transfer comment: unable to safely perform at this time.  Ambulation/Gait                Stairs            Wheelchair Mobility    Modified Rankin (Stroke Patients Only)       Balance   Sitting-balance support: Bilateral upper extremity supported;Feet supported (Trunk support) Sitting balance-Leahy Scale: Poor                                       Pertinent Vitals/Pain Pain Assessment: Faces Faces Pain Scale: Hurts little more Pain Location: generalized all over Pain Descriptors / Indicators: Sore Pain Intervention(s): Monitored during session    Home Living Family/patient expects to be discharged to:: Skilled nursing facility Living Arrangements: Spouse/significant other Available Help at Discharge: Family;Available 24 hours/day Type of Home: Apartment Home Access: Level entry     Home Layout: One level Home Equipment: Walker - 2 wheels;Cane - single point;Shower seat;Grab bars - tub/shower;Hand held shower head;Wheelchair - Careers adviser (comment);Bedside commode (Hemiwalker)      Prior Function Level of Independence: Needs assistance   Gait / Transfers Assistance Needed: Uses hemiwalker in house.  Uses w/c for outside of home.  ADL's / Homemaking Assistance Needed: Has  a home health aide that assists with bathing and dressing, laundry, meals 3x per week for 3-4 hours.  Significant other does meal prep, housekeeping, etc.  Comments: used tub transfer bench for showering     Hand Dominance   Dominant Hand: Left    Extremity/Trunk Assessment   Upper Extremity Assessment: Defer  to OT evaluation           Lower Extremity Assessment: Generalized weakness RLE Deficits / Details: Noted increased edema and wounds.  Decreased strength. LLE Deficits / Details: decreased strength noted, increased bilateral LE edema     Communication   Communication: No difficulties  Cognition Arousal/Alertness: Awake/alert Behavior During Therapy: Anxious;Restless Overall Cognitive Status: No family/caregiver present to determine baseline cognitive functioning                      General Comments General comments (skin integrity, edema, etc.): performed general LEs ther ex in bed, generalized weakness and fatigue noted, patient with difficulty tolerating increased activity secondary to anxiety (VSS)    Exercises        Assessment/Plan    PT Assessment Patient needs continued PT services  PT Diagnosis Difficulty walking;Generalized weakness;Acute pain   PT Problem List Decreased strength;Decreased range of motion;Decreased activity tolerance;Decreased balance;Decreased mobility;Decreased knowledge of use of DME;Cardiopulmonary status limiting activity;Impaired sensation;Obesity;Pain;Decreased skin integrity  PT Treatment Interventions DME instruction;Gait training;Functional mobility training;Therapeutic activities;Therapeutic exercise;Balance training;Patient/family education   PT Goals (Current goals can be found in the Care Plan section) Acute Rehab PT Goals Patient Stated Goal: None stated PT Goal Formulation: With patient Time For Goal Achievement: 12/30/15 Potential to Achieve Goals: Fair    Frequency Min 3X/week   Barriers to discharge        Co-evaluation               End of Session Equipment Utilized During Treatment: Oxygen Activity Tolerance: Patient limited by fatigue;Other (comment) (dizziness and inability to tolerated increased activity ) Patient left: in bed;with call bell/phone within reach Nurse Communication: Mobility status          Time: [id number] PT Time Calculation (min) (ACUTE ONLY): 32 min   Charges:   PT Evaluation $PT Re-evaluation: 1 Procedure PT Treatments $Therapeutic Activity: 8-22 mins   PT G CodesFabio Asa:        Maicy Filip J 01/04/2016, 3:45 PM Charlotte Crumbevon Tracey Stewart, PT DPT  [phone]

## 2016-01-05 DIAGNOSIS — R5381 Other malaise: Secondary | ICD-10-CM

## 2016-01-05 LAB — CBC
HEMATOCRIT: 30.6 % — AB (ref 36.0–46.0)
HEMOGLOBIN: 8.5 g/dL — AB (ref 12.0–15.0)
MCH: 23.2 pg — ABNORMAL LOW (ref 26.0–34.0)
MCHC: 27.8 g/dL — AB (ref 30.0–36.0)
MCV: 83.6 fL (ref 78.0–100.0)
Platelets: 251 10*3/uL (ref 150–400)
RBC: 3.66 MIL/uL — ABNORMAL LOW (ref 3.87–5.11)
RDW: 16.8 % — ABNORMAL HIGH (ref 11.5–15.5)
WBC: 12.4 10*3/uL — ABNORMAL HIGH (ref 4.0–10.5)

## 2016-01-05 LAB — GLUCOSE, CAPILLARY
GLUCOSE-CAPILLARY: 140 mg/dL — AB (ref 65–99)
GLUCOSE-CAPILLARY: 263 mg/dL — AB (ref 65–99)
GLUCOSE-CAPILLARY: 92 mg/dL (ref 65–99)
Glucose-Capillary: 52 mg/dL — ABNORMAL LOW (ref 65–99)
Glucose-Capillary: 229 mg/dL — ABNORMAL HIGH (ref 65–99)
Glucose-Capillary: 215 mg/dL — ABNORMAL HIGH (ref 65–99)
Glucose-Capillary: 216 mg/dL — ABNORMAL HIGH (ref 65–99)

## 2016-01-05 LAB — BASIC METABOLIC PANEL
ANION GAP: 10 (ref 5–15)
BUN: 34 mg/dL — ABNORMAL HIGH (ref 6–20)
CHLORIDE: 99 mmol/L — AB (ref 101–111)
CO2: 40 mmol/L — AB (ref 22–32)
CREATININE: 0.69 mg/dL (ref 0.44–1.00)
Calcium: 9.3 mg/dL (ref 8.9–10.3)
GFR calc non Af Amer: >60 mL/min (ref >60)
GLUCOSE: 130 mg/dL — AB (ref 65–99)
Potassium: 3.4 mmol/L — ABNORMAL LOW (ref 3.5–5.1)
Sodium: 149 mmol/L — ABNORMAL HIGH (ref 135–145)

## 2016-01-05 LAB — TRIGLYCERIDES: TRIGLYCERIDES: 80 mg/dL (ref <150)

## 2016-01-05 LAB — MAGNESIUM: Magnesium: 2 mg/dL (ref 1.7–2.4)

## 2016-01-05 LAB — PHOSPHORUS: PHOSPHORUS: 3.7 mg/dL (ref 2.5–4.6)

## 2016-01-05 MED ORDER — ASPIRIN 81 MG PO CHEW
81.0000 mg | CHEWABLE_TABLET | Freq: Every day | ORAL | Status: DC
  Administered 2016-01-05 – 2016-01-09 (×5): 81 mg via ORAL
  Filled 2016-01-05 (×4): qty 1

## 2016-01-05 MED ORDER — PANTOPRAZOLE SODIUM 40 MG PO TBEC
40.0000 mg | DELAYED_RELEASE_TABLET | Freq: Every day | ORAL | Status: DC
  Administered 2016-01-05 – 2016-01-09 (×5): 40 mg via ORAL
  Filled 2016-01-05 (×5): qty 1

## 2016-01-05 MED ORDER — CETYLPYRIDINIUM CHLORIDE 0.05 % MT LIQD
7.0000 mL | Freq: Two times a day (BID) | OROMUCOSAL | Status: DC
  Administered 2016-01-05 – 2016-01-09 (×6): 7 mL via OROMUCOSAL

## 2016-01-05 MED ORDER — GUAIFENESIN ER 600 MG PO TB12
600.0000 mg | ORAL_TABLET | Freq: Two times a day (BID) | ORAL | Status: DC
  Administered 2016-01-05 – 2016-01-09 (×9): 600 mg via ORAL
  Filled 2016-01-05 (×9): qty 1

## 2016-01-05 MED ORDER — INSULIN ASPART 100 UNIT/ML ~~LOC~~ SOLN
0.0000 [IU] | Freq: Three times a day (TID) | SUBCUTANEOUS | Status: DC
  Administered 2016-01-05 (×2): 5 [IU] via SUBCUTANEOUS

## 2016-01-05 MED ORDER — SIMVASTATIN 20 MG PO TABS
10.0000 mg | ORAL_TABLET | Freq: Every day | ORAL | Status: DC
  Administered 2016-01-05 – 2016-01-08 (×4): 10 mg via ORAL
  Filled 2016-01-05 (×4): qty 1

## 2016-01-05 MED ORDER — PREDNISONE 10 MG PO TABS
10.0000 mg | ORAL_TABLET | Freq: Every day | ORAL | Status: DC
  Administered 2016-01-06 – 2016-01-09 (×4): 10 mg via ORAL
  Filled 2016-01-05 (×4): qty 1

## 2016-01-05 MED ORDER — ADULT MULTIVITAMIN W/MINERALS CH
1.0000 | ORAL_TABLET | Freq: Every day | ORAL | Status: DC
  Administered 2016-01-05 – 2016-01-09 (×5): 1 via ORAL
  Filled 2016-01-05 (×5): qty 1

## 2016-01-05 MED ORDER — GUAIFENESIN-DM 100-10 MG/5ML PO SYRP
5.0000 mL | ORAL_SOLUTION | ORAL | Status: DC | PRN
  Administered 2016-01-06: 5 mL via ORAL
  Filled 2016-01-05: qty 5

## 2016-01-05 MED ORDER — DEXTROSE 50 % IV SOLN
1.0000 | Freq: Once | INTRAVENOUS | Status: AC
  Administered 2016-01-05: 50 mL via INTRAVENOUS

## 2016-01-05 MED ORDER — DIVALPROEX SODIUM 250 MG PO DR TAB
250.0000 mg | DELAYED_RELEASE_TABLET | Freq: Two times a day (BID) | ORAL | Status: DC
  Administered 2016-01-05 – 2016-01-09 (×9): 250 mg via ORAL
  Filled 2016-01-05 (×9): qty 1

## 2016-01-05 MED ORDER — METOPROLOL TARTRATE 12.5 MG HALF TABLET
12.5000 mg | ORAL_TABLET | Freq: Two times a day (BID) | ORAL | Status: DC
  Administered 2016-01-05 – 2016-01-09 (×9): 12.5 mg via ORAL
  Filled 2016-01-05 (×9): qty 1

## 2016-01-05 MED ORDER — DEXTROSE 50 % IV SOLN
INTRAVENOUS | Status: AC
  Administered 2016-01-05: 50 mL via INTRAVENOUS
  Filled 2016-01-05: qty 50

## 2016-01-05 MED ORDER — INSULIN ASPART 100 UNIT/ML ~~LOC~~ SOLN: 0.0000 [IU] | Freq: Three times a day (TID) | SUBCUTANEOUS | Status: DC

## 2016-01-05 MED ORDER — INSULIN ASPART 100 UNIT/ML ~~LOC~~ SOLN
4.0000 [IU] | Freq: Three times a day (TID) | SUBCUTANEOUS | Status: DC
  Administered 2016-01-05: 4 [IU] via SUBCUTANEOUS

## 2016-01-05 MED ORDER — ARIPIPRAZOLE 5 MG PO TABS
5.0000 mg | ORAL_TABLET | Freq: Every day | ORAL | Status: DC
  Administered 2016-01-05 – 2016-01-09 (×5): 5 mg via ORAL
  Filled 2016-01-05 (×4): qty 1

## 2016-01-05 MED ORDER — LEVOTHYROXINE SODIUM 25 MCG PO TABS
125.0000 ug | ORAL_TABLET | Freq: Every day | ORAL | Status: DC
  Administered 2016-01-06 – 2016-01-09 (×4): 125 ug via ORAL
  Filled 2016-01-05 (×5): qty 1

## 2016-01-05 NOTE — Progress Notes (Signed)
Speech Language Pathology Dysphagia Treatment Patient Details Name: Evaristo BuryMary J Grein MRN: [id number] DOB: 16-Jul-1961 Today's Date: 01/05/2016 Time: [id number] SLP Time Calculation (min) (ACUTE ONLY): 18 min  Assessment / Plan / Recommendation Clinical Impression    Pt demonstrated improved vocal quality from previous evaluation. Coughing noted after SLP left room, however per RN pt with baseline cough to manage secretions post-extubation. No other overt s/s of penetration/aspiration observed across consistencies. Respiratory rate appropriate for adequate breathing and swallowing coordination. Prolonged mastication observed during intake of solids due to edentulous status. Pt educated re: diet recommendation of Dysphagia 3 (mechanical soft) textures, thin liquids (straws allowed), with meds whole in puree. SLP will f/u x1 to determine diet tolerance.    Diet Recommendation    Dysphagia 3 (mechanical soft) textures, thin liquids (straws allowed)   SLP Plan Continue with current plan of care      Swallowing Goals     General Behavior/Cognition: Alert;Cooperative;Pleasant mood Patient Positioning: Upright in bed Oral care provided: Yes HPI: 55 year old woman admitted on 03/31 with acute HF exacerbation 2/2 non-compliance with diuretic therapy. The patient was doing fairly well with diuresis until 12/25/15 when she developed significant respiratory distress. Her O2 sats were in the 70s on 100% NRB and high flow [place]. She was intolerant of BiPAP and the critical care team was subsequently consulted for intubation.  She vomited large volume of emesis during intubation and subsequently had what appeared to be significant aspiration event. Pt intubated from 4/3-4/12.   Oral Cavity - Oral Hygiene     Dysphagia Treatment Treatment Methods: Skilled observation;Upgraded PO texture trial;Differential diagnosis;Compensation strategy training;Patient/caregiver education Patient observed directly with PO's:  Yes Type of PO's observed: Regular;Thin liquids;Dysphagia 1 (puree) Feeding: Able to feed self Liquids provided via: Cup;Straw Oral Phase Signs & Symptoms: Prolonged mastication Pharyngeal Phase Signs & Symptoms:  (none) Type of cueing: Verbal Amount of cueing: Minimal   Lynita LombardLauren Kathi Dohn, Student-SLP      Marc Leichter 01/05/2016, 9:55 AM

## 2016-01-05 NOTE — Progress Notes (Signed)
Rehab admissions - I am following for potential acute inpatient rehab admission.  Once patient able to participate and tolerate more therapy, then can consider inpatient rehab stay.  I will check back on Monday for progress.  Call me for questions.  #[phone]

## 2016-01-05 NOTE — Progress Notes (Signed)
Notified Inpatient Diabetes Coordinator on team pager of pt no longer on tube feedings and is now on dysphagia diet.

## 2016-01-05 NOTE — Progress Notes (Signed)
Rehab Admissions Coordinator Note:  Patient was screened by Clois DupesBoyette, Idora Brosious Godwin for appropriateness for an Inpatient Acute Rehab Consult per PT recommendation.   At this time, we are recommending await further tolerance with therapy before determining rehab venue. I will follow.Clois Dupes.  Juanisha Bautch Godwin 01/05/2016, 7:46 AM  I can be reached at [phone].
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
## 2016-01-05 NOTE — Progress Notes (Signed)
Inpatient Diabetes Program Recommendations  AACE/ADA: New Consensus Statement on Inpatient Glycemic Control (2015)  Target Ranges:  Prepandial:   less than 140 mg/dL      Peak postprandial:   less than 180 mg/dL (1-2 hours)      Critically ill patients:  140 - 180 mg/dL   Review of Glycemic Control  Inpatient Diabetes Program Recommendations:  Insulin - Basal: currently on Lantus 50 units  Correction (SSI): decrease Novolog to moderate scale Q4 Insulin - Meal Coverage: add Novolog 4 units Q4 as tube feed coverage  Thank you  [name] BSN, RN,CDE Inpatient Diabetes Coordinator [phone] (team pager)

## 2016-01-05 NOTE — Progress Notes (Signed)
Patient ID: [name], female   DOB: 06-02-1961, 55 y.o.   MRN: [id number]    PROGRESS NOTE    [name]  WUJ:[id number] DOB: [birth year] DOA: 12/22/2015  PCP: [name], MD   Outpatient Specialists:   Brief Narrative:  55 year old woman admitted on 03/31 with acute HF exacerbation 2/2 non-compliance with diuretic therapy admitted to ICU on 04/03 for acute hypoxic respiratory failure due to ARDS due to aspiration.   SIGNIFICANT EVENTS: 3/31 Admit 4/03 VDRF >> to ICU, ARDS protocol, start nimbex 4/05 d/c nimbex 4/10 to precedex, depakote added 4/14 [facility] assumed care from PCCM  Assessment & Plan: Acute hypoxic respiratory failure, VDRF - ARDS 2/2 aspiration, AECOPD - Continue steroid and scheduled duonebs. - still with cough but maintaining oxygen saturation at target range  - CXR in AM - add antitussive   Acute on chronic diastolic HF  - weight 213 lbs this AM, pt was given lasix - continue to monitor weight, I/O  Severe MS 2/2 rheumatic heart disease - continue Prednisone   HTN, essential - reasonable inpatient control  - continue metoprolol  - Can likely resume ACEI soon  Acute kidney injury  - resolved, Cr has remained WNL in the past 48 hours   Hypernatremia - free H20, Na trending up, change fluids to D5 - BMP in AM  Hypokalemia - continue to supplement and repeat BMP in AM  Loose stools  - improving  Leukocytosis 2/2 to aspiration PNA and steroids - improving overall, completed course of ABX  Anemia of critical illness in the setting of AoCD  - Hg overall stable, no signs of bleeding  - CBC in AM  DM type 2 on long term insulin and complications of neuropathy  - Lantus to 50 units QHS  Hypothyroidism   - Continue Levothyroxine, repeat TSH 6.475  Acute encephalopathy - stable for now   DVT prophylaxis: Lovenox SQ  Code Status: Full Family Communication: Patient at bedside  Disposition Plan: IR by 4/17  Consultants:    IR  Procedures:   4/04 Echo >> EF 60 to 65%, grade 2 diastolic CHF, severe MS  4/10 CT head > Stable diffuse cortical atrophy. Mild chronic ischemic white matter disease. No acute intracranial abnormality seen.  4/10 EEG > abnormal with diffuse slowing c/w several metabolic abcormalitites  Antimicrobials:   CULTURES: 4/03 Sputum >> rare yeast c/w candida 4/04 Influenza PCR >> negative 4/04 Respiratory viral panel >> negative   ANTIBIOTICS: 4/01 Doxycycline >> 4/03 4/03 Vancomycin >> 04/06 4/03 Zosyn >> 04/10  Subjective: Pt reports feeling better this AM but still with persistent cough.   Objective: Filed Vitals:   01/05/16 1000 01/05/16 1052 01/05/16 1200 01/05/16 1238  BP: 122/65 122/65 131/78   Pulse: 73 73 78   Temp:    98.6 F (37 C)  TempSrc:    Oral  Resp: 17  22   Height:      Weight:      SpO2: 100%  100%     Intake/Output Summary (Last 24 hours) at 01/05/16 1516 Last data filed at 01/05/16 1200  Gross per 24 hour  Intake 1848.75 ml  Output   2900 ml  Net -1051.25 ml   Filed Weights   01/03/16 0500 01/04/16 0600 01/05/16 0500  Weight: 100.2 kg (220 lb 14.4 oz) 97.8 kg (215 lb 9.8 oz) 96.9 kg (213 lb 10 oz)    Examination:  General exam: Appears calm and comfortable  Respiratory  system: scattered rhonchi Respiratory effort normal. Cardiovascular system: S1 & S2 heard, RRR. No JVD, murmurs, rubs, gallops or clicks. No pedal edema. Gastrointestinal system: Abdomen is nondistended, soft and nontender.  Central nervous system: Alert and oriented. No focal neurological deficits. Psychiatry: Judgement and insight appear normal. Mood & affect appropriate.    Data Reviewed: I have personally reviewed following labs and imaging studies  CBC:  Recent Labs Lab 12/30/15 0415  01/01/16 0512 01/02/16 0415 01/03/16 0455 01/04/16 0408 01/05/16 0429  WBC 13.2*  < > 8.8 10.2 12.3* 10.7* 12.4*  NEUTROABS 10.2*  --   --   --   --   --   --   HGB  8.6*  < > 8.3* 8.9* 8.4* 8.1* 8.5*  HCT 30.3*  < > 28.6* 30.8* 27.5* 28.4* 30.6*  MCV 83.0  < > 81.7 81.7 81.6 83.0 83.6  PLT 306  < > 240 200 199 200 251  < > = values in this interval not displayed. Basic Metabolic Panel:  Recent Labs Lab 12/30/15 0415  01/01/16 2110 01/02/16 0415 01/03/16 0455 01/04/16 0408 01/05/16 0429  NA 147*  < > 148* 145 147* 147* 149*  K 3.3*  < > 4.1 3.5 2.8* 3.1* 3.4*  CL 103  < > 104 100* 99* 99* 99*  CO2 32  < > 34* 34* 36* 39* 40*  GLUCOSE 155*  < > 343* 347* 152* 121* 130*  BUN 49*  < > 31* 30* 30* 29* 34*  CREATININE 0.90  < > 0.67 0.69 0.62 0.64 0.69  CALCIUM 8.6*  < > 9.1 8.9 9.2 9.0 9.3  MG 2.2  --   --  2.0 1.8 2.0 2.0  PHOS  --   --   --  2.6 2.9 4.0 3.7  < > = values in this interval not displayed.  Recent Labs Lab 01/01/16 2211  AMMONIA 31   CBG:  Recent Labs Lab 01/04/16 2356 01/05/16 0405 01/05/16 0758 01/05/16 0833 01/05/16 1241  GLUCAP 263* 140* 52* 92 229*   Lipid Profile:  Recent Labs  01/03/16 0455 01/05/16 0429  TRIG 119 80   Urine analysis:    Component Value Date/Time   COLORURINE YELLOW 12/22/2015 2212   APPEARANCEUR CLOUDY* 12/22/2015 2212   LABSPEC 1.014 12/22/2015 2212   PHURINE 5.0 12/22/2015 2212   GLUCOSEU NEGATIVE 12/22/2015 2212   HGBUR NEGATIVE 12/22/2015 2212   BILIRUBINUR NEGATIVE 12/22/2015 2212   KETONESUR NEGATIVE 12/22/2015 2212   PROTEINUR NEGATIVE 12/22/2015 2212   UROBILINOGEN 0.2 07/29/2015 1948   NITRITE NEGATIVE 12/22/2015 2212   LEUKOCYTESUR TRACE* 12/22/2015 2212    Recent Results (from the past 240 hour(s))  C difficile quick scan w PCR reflex     Status: None   Collection Time: 12/29/15 12:09 AM  Result Value Ref Range Status   C Diff antigen NEGATIVE NEGATIVE Final   C Diff toxin NEGATIVE NEGATIVE Final   C Diff interpretation Negative for toxigenic C. difficile  Final      Radiology Studies: Dg Chest Port 1 View 01/04/2016 Lines and tubes in stable position. 2.  Persistent changes of congestive heart failure with bilateral pulmonary interstitial edema. Basilar atelectasis and/or infiltrates again noted .  Scheduled Meds: . ARIPiprazole  5 mg Oral Daily  . aspirin  81 mg Oral Daily  . divalproex  250 mg Oral Q12H  . enoxaparin  injection  40 mg Subcutaneous Daily  . insulin glargine  50 Units Subcutaneous QHS  .  ipratropium-albuterol  3 mL Nebulization BID  . levothyroxine  125 mcg Oral QAC breakfast  . metoprolol tartrate  12.5 mg Oral BID  . pantoprazole  40 mg Oral Q breakfast  . predniSONE  10 mg Oral Q breakfast  . simvastatin  10 mg Oral q1800   Continuous Infusions: . sodium chloride 10 mL/hr at 01/05/16 0800     LOS: 13 days   Time spent: 20 minutes   Debbora PrestoMAGICK-Karianne Nogueira, MD [facility] Pager [phone]  If 7PM-7AM, please contact night-coverage [email or web address] Password [facility] 01/05/2016, 3:16 PM

## 2016-01-05 NOTE — Progress Notes (Signed)
Inpatient Diabetes Program Recommendations  AACE/ADA: New Consensus Statement on Inpatient Glycemic Control (2015)  Target Ranges:  Prepandial:   less than 140 mg/dL      Peak postprandial:   less than 180 mg/dL (1-2 hours)      Critically ill patients:  140 - 180 mg/dL   Review of Glycemic Control  Results for [name], [name] (MRN [id number]) as of 01/05/2016 12:37  Ref. Range 01/04/2016 19:48 01/04/2016 23:56 01/05/2016 04:05 01/05/2016 07:58 01/05/2016 08:33  Glucose-Capillary Latest Ref Range: 65-99 mg/dL 409345 (H) 811263 (H) 914140 (H) 52 (L) 92    Diabetes history: Type 2, A1C 6.7% on 12/23/15  Outpatient Diabetes medications: Amaryl 4mg /day, Novolog 6-15 units tid, Lantus 60 units/day Current orders for Inpatient glycemic control: Novolog 0-20 q4h Lantus 50 units qday- orders in for future Novolog 4 units tid with meals, Novolog 0-15 units tid with meals   Inpatient Diabetes Program Recommendations:   Phone call from RN reporting that patient is no longer receiving tube feeds and has been ordered a dyplasia diet- asked to re-evaluate insulin needs  Consider decreasing Lantus to 40 units qhs.  Agree with Novolog 0-15 units tid with meals  Agree with Novolog 4 units tid with meals (hold if patient eats less than 50%)- RN reports patient has already eaten graham crackers and 3 containers of applesauce   Susette RacerJulie Telesforo Brosnahan, RN, [place], [place]HA, CDE Diabetes Coordinator Inpatient Diabetes Program  [phone] (Team Pager) [phone] [facility] Office) 01/05/2016 12:50 PM

## 2016-01-06 ENCOUNTER — Inpatient Hospital Stay (HOSPITAL_COMMUNITY): Payer: Medicaid Other

## 2016-01-06 LAB — BASIC METABOLIC PANEL
ANION GAP: 9 (ref 5–15)
BUN: 24 mg/dL — ABNORMAL HIGH (ref 6–20)
CHLORIDE: 93 mmol/L — AB (ref 101–111)
CO2: 39 mmol/L — AB (ref 22–32)
Calcium: 9.3 mg/dL (ref 8.9–10.3)
Creatinine, Ser: 0.6 mg/dL (ref 0.44–1.00)
GFR calc non Af Amer: >60 mL/min (ref >60)
Glucose, Bld: 117 mg/dL — ABNORMAL HIGH (ref 65–99)
POTASSIUM: 3.2 mmol/L — AB (ref 3.5–5.1)
Sodium: 141 mmol/L (ref 135–145)

## 2016-01-06 LAB — GLUCOSE, CAPILLARY
GLUCOSE-CAPILLARY: 117 mg/dL — AB (ref 65–99)
GLUCOSE-CAPILLARY: 99 mg/dL (ref 65–99)
Glucose-Capillary: 46 mg/dL — ABNORMAL LOW (ref 65–99)
Glucose-Capillary: 94 mg/dL (ref 65–99)
Glucose-Capillary: 140 mg/dL — ABNORMAL HIGH (ref 65–99)

## 2016-01-06 LAB — CBC
HEMATOCRIT: 29 % — AB (ref 36.0–46.0)
Hemoglobin: 8.3 g/dL — ABNORMAL LOW (ref 12.0–15.0)
MCH: 23.6 pg — ABNORMAL LOW (ref 26.0–34.0)
MCHC: 28.6 g/dL — AB (ref 30.0–36.0)
MCV: 82.4 fL (ref 78.0–100.0)
Platelets: 206 10*3/uL (ref 150–400)
RBC: 3.52 MIL/uL — ABNORMAL LOW (ref 3.87–5.11)
RDW: 16.9 % — AB (ref 11.5–15.5)
WBC: 12.3 10*3/uL — AB (ref 4.0–10.5)

## 2016-01-06 MED ORDER — POTASSIUM CHLORIDE CRYS ER 20 MEQ PO TBCR
40.0000 meq | EXTENDED_RELEASE_TABLET | Freq: Once | ORAL | Status: AC
  Administered 2016-01-06: 40 meq via ORAL
  Filled 2016-01-06: qty 2

## 2016-01-06 MED ORDER — INSULIN ASPART 100 UNIT/ML ~~LOC~~ SOLN
0.0000 [IU] | Freq: Three times a day (TID) | SUBCUTANEOUS | Status: DC
  Administered 2016-01-08: 5 [IU] via SUBCUTANEOUS
  Administered 2016-01-09 (×2): 2 [IU] via SUBCUTANEOUS

## 2016-01-06 MED ORDER — IPRATROPIUM-ALBUTEROL 0.5-2.5 (3) MG/3ML IN SOLN: 3.0000 mL | Freq: Four times a day (QID) | RESPIRATORY_TRACT | Status: DC | PRN

## 2016-01-06 NOTE — Progress Notes (Signed)
Patient ID: [name], female   DOB: 28-Sep-1960, 55 y.o.   MRN: [id number]    PROGRESS NOTE    [name]  JXB:[id number] DOB: 03/26/1961 DOA: 12/22/2015  PCP: Elizabeth German, MD   Outpatient Specialists:   Brief Narrative:  55 year old woman admitted on 03/31 with acute HF exacerbation 2/2 non-compliance with diuretic therapy admitted to ICU on 04/03 for acute hypoxic respiratory failure due to ARDS due to aspiration.   SIGNIFICANT EVENTS: 3/31 Admit 4/03 VDRF >> to ICU, ARDS protocol, start nimbex 4/05 d/c nimbex 4/10 to precedex, depakote added 4/14 [facility] assumed care from PCCM  Assessment & Plan: Acute hypoxic respiratory failure, VDRF - ARDS 2/2 aspiration, AECOPD - Continue steroid and scheduled duonebs. - still with cough but maintaining oxygen saturation at target range  - CXR noting improvement in vascular congestion  - added antitussive and it seems to be helping   Acute on chronic diastolic HF  - weight 213 lbs this AM, remains stable in the past 24 hours - weight was 226 lbs on admission  - continue to monitor daily weights, I/O  Severe MS 2/2 rheumatic heart disease - continue Prednisone   HTN, essential - SBP on low end of normal this AM - continue metoprolol but monitor closely  - Can likely resume ACEI soon  Acute kidney injury  - resolved, Cr has remained WNL in the past 48 hours   Hypernatremia - free H20 given, sodium stable this AM - BMP In AM  Hypokalemia - still low, continue to supplement and repeat BMP in AM  Loose stools  - improving  Leukocytosis 2/2 to aspiration PNA and steroids - improving overall, completed course of ABX - CXR this AM with no signs of PNA  Anemia of critical illness in the setting of AoCD  - Hg overall stable, no signs of bleeding  - CBC in AM  DM type 2 on long term insulin and complications of neuropathy  - Lantus to 50 units QHS - CBG in low 100's, d/c short acting Novolog    Hypothyroidism     - Continue Levothyroxine, repeat TSH 6.475  Acute encephalopathy - stable for now  DVT prophylaxis: Lovenox SQ  Code Status: Full Family Communication: Patient at bedside  Disposition Plan: IR by 4/17  Consultants:   IR  Procedures:   4/04 Echo >> EF 60 to 65%, grade 2 diastolic CHF, severe MS  4/10 CT head > Stable diffuse cortical atrophy. Mild chronic ischemic white matter disease. No acute intracranial abnormality seen.  4/10 EEG > abnormal with diffuse slowing c/w several metabolic abcormalitites  Antimicrobials:   CULTURES: 4/03 Sputum >> rare yeast c/w candida 4/04 Influenza PCR >> negative 4/04 Respiratory viral panel >> negative   ANTIBIOTICS: 4/01 Doxycycline >> 4/03 4/03 Vancomycin >> 04/06 4/03 Zosyn >> 04/10  Subjective: Pt reports feeling better this AM but still with persistent cough.   Objective: Filed Vitals:   01/06/16 0500 01/06/16 0744 01/06/16 0900 01/06/16 1028  BP:  123/66 104/70 105/56  Pulse:  64    Temp:  98.1 F (36.7 C)    TempSrc:  Oral    Resp:  18    Height:      Weight: 97 kg (213 lb 13.5 oz)     SpO2:  100%      Intake/Output Summary (Last 24 hours) at 01/06/16 1326 Last data filed at 01/06/16 1200  Gross per 24 hour  Intake  370 ml  Output    350 ml  Net     20 ml   Filed Weights   01/04/16 0600 01/05/16 0500 01/06/16 0500  Weight: 97.8 kg (215 lb 9.8 oz) 96.9 kg (213 lb 10 oz) 97 kg (213 lb 13.5 oz)    Examination:  General exam: Appears calm and comfortable  Respiratory system: scattered rhonchi Respiratory effort normal. Cardiovascular system: S1 & S2 heard, RRR. No JVD, murmurs, rubs, gallops or clicks. No pedal edema. Gastrointestinal system: Abdomen is nondistended, soft and nontender.  Central nervous system: Alert and oriented. No focal neurological deficits.  Data Reviewed: I have personally reviewed following labs and imaging studies  CBC:  Recent Labs Lab 01/02/16 0415 01/03/16 0455  01/04/16 0408 01/05/16 0429 01/06/16 0400  WBC 10.2 12.3* 10.7* 12.4* 12.3*  HGB 8.9* 8.4* 8.1* 8.5* 8.3*  HCT 30.8* 27.5* 28.4* 30.6* 29.0*  MCV 81.7 81.6 83.0 83.6 82.4  PLT 200 199 200 251 206   Basic Metabolic Panel:  Recent Labs Lab 01/02/16 0415 01/03/16 0455 01/04/16 0408 01/05/16 0429 01/06/16 0400  NA 145 147* 147* 149* 141  K 3.5 2.8* 3.1* 3.4* 3.2*  CL 100* 99* 99* 99* 93*  CO2 34* 36* 39* 40* 39*  GLUCOSE 347* 152* 121* 130* 117*  BUN 30* 30* 29* 34* 24*  CREATININE 0.69 0.62 0.64 0.69 0.60  CALCIUM 8.9 9.2 9.0 9.3 9.3  MG 2.0 1.8 2.0 2.0  --   PHOS 2.6 2.9 4.0 3.7  --     Recent Labs Lab 01/01/16 2211  AMMONIA 31   CBG:  Recent Labs Lab 01/05/16 1620 01/05/16 2116 01/06/16 0748 01/06/16 0825 01/06/16 1128  GLUCAP 215* 216* 46* 117* 99   Lipid Profile:  Recent Labs  01/05/16 0429  TRIG 80   Urine analysis:    Component Value Date/Time   COLORURINE YELLOW 12/22/2015 2212   APPEARANCEUR CLOUDY* 12/22/2015 2212   LABSPEC 1.014 12/22/2015 2212   PHURINE 5.0 12/22/2015 2212   GLUCOSEU NEGATIVE 12/22/2015 2212   HGBUR NEGATIVE 12/22/2015 2212   BILIRUBINUR NEGATIVE 12/22/2015 2212   KETONESUR NEGATIVE 12/22/2015 2212   PROTEINUR NEGATIVE 12/22/2015 2212   UROBILINOGEN 0.2 07/29/2015 1948   NITRITE NEGATIVE 12/22/2015 2212   LEUKOCYTESUR TRACE* 12/22/2015 2212    Recent Results (from the past 240 hour(s))  C difficile quick scan w PCR reflex     Status: None   Collection Time: 12/29/15 12:09 AM  Result Value Ref Range Status   C Diff antigen NEGATIVE NEGATIVE Final   C Diff toxin NEGATIVE NEGATIVE Final   C Diff interpretation Negative for toxigenic C. difficile  Final      Radiology Studies: Dg Chest Port 1 View 01/04/2016 Lines and tubes in stable position. 2. Persistent changes of congestive heart failure with bilateral pulmonary interstitial edema. Basilar atelectasis and/or infiltrates again noted .  Scheduled Meds: .  ARIPiprazole  5 mg Oral Daily  . aspirin  81 mg Oral Daily  . divalproex  250 mg Oral Q12H  . enoxaparin  injection  40 mg Subcutaneous Daily  . insulin glargine  50 Units Subcutaneous QHS  . ipratropium-albuterol  3 mL Nebulization BID  . levothyroxine  125 mcg Oral QAC breakfast  . metoprolol tartrate  12.5 mg Oral BID  . pantoprazole  40 mg Oral Q breakfast  . predniSONE  10 mg Oral Q breakfast  . simvastatin  10 mg Oral q1800   Continuous Infusions:   LOS: 14  days   Time spent: 20 minutes   Debbora PrestoMAGICK-Raja Caputi, MD [facility] Pager [phone]  If 7PM-7AM, please contact night-coverage [email or web address] Password [email or web address] 01/06/2016, 1:26 PM

## 2016-01-06 NOTE — Progress Notes (Signed)
Pt CBG 46, pt clammy but alert and oriented.  Orange juice and peanut butter, pt ate without difficulty. Will re-check CBG.

## 2016-01-06 NOTE — Progress Notes (Signed)
Pt CBG running low for now, 99 at lunch time.  Paged Dr. Tyrell AntonioMyer pt has 4 units sched and ss, does she want to dc sched for now?

## 2016-01-06 NOTE — Progress Notes (Signed)
CBG re-check 117. 

## 2016-01-06 NOTE — Progress Notes (Signed)
Pt has had only 250cc's out today.  Paged Dr. Heide ScalesMyer FYI

## 2016-01-07 LAB — GLUCOSE, CAPILLARY
GLUCOSE-CAPILLARY: 50 mg/dL — AB (ref 65–99)
GLUCOSE-CAPILLARY: 77 mg/dL (ref 65–99)
GLUCOSE-CAPILLARY: 156 mg/dL — AB (ref 65–99)
Glucose-Capillary: 78 mg/dL (ref 65–99)
Glucose-Capillary: 80 mg/dL (ref 65–99)
Glucose-Capillary: 46 mg/dL — ABNORMAL LOW (ref 65–99)
Glucose-Capillary: 57 mg/dL — ABNORMAL LOW (ref 65–99)
Glucose-Capillary: 84 mg/dL (ref 65–99)
Glucose-Capillary: 180 mg/dL — ABNORMAL HIGH (ref 65–99)

## 2016-01-07 LAB — BASIC METABOLIC PANEL
ANION GAP: 8 (ref 5–15)
BUN: 17 mg/dL (ref 6–20)
CO2: 39 mmol/L — ABNORMAL HIGH (ref 22–32)
Calcium: 9.2 mg/dL (ref 8.9–10.3)
Chloride: 94 mmol/L — ABNORMAL LOW (ref 101–111)
Creatinine, Ser: 0.63 mg/dL (ref 0.44–1.00)
GFR calc Af Amer: >60 mL/min (ref >60)
GFR calc non Af Amer: >60 mL/min (ref >60)
GLUCOSE: 78 mg/dL (ref 65–99)
POTASSIUM: 4.2 mmol/L (ref 3.5–5.1)
Sodium: 141 mmol/L (ref 135–145)

## 2016-01-07 LAB — CBC
HCT: 30.8 % — ABNORMAL LOW (ref 36.0–46.0)
Hemoglobin: 8.9 g/dL — ABNORMAL LOW (ref 12.0–15.0)
MCH: 23.7 pg — ABNORMAL LOW (ref 26.0–34.0)
MCHC: 28.9 g/dL — ABNORMAL LOW (ref 30.0–36.0)
MCV: 81.9 fL (ref 78.0–100.0)
Platelets: 301 10*3/uL (ref 150–400)
RBC: 3.76 MIL/uL — ABNORMAL LOW (ref 3.87–5.11)
RDW: 17 % — AB (ref 11.5–15.5)
WBC: 11.9 10*3/uL — AB (ref 4.0–10.5)

## 2016-01-07 LAB — TRIGLYCERIDES: Triglycerides: 76 mg/dL (ref <150)

## 2016-01-07 MED ORDER — INSULIN GLARGINE 100 UNIT/ML ~~LOC~~ SOLN
15.0000 [IU] | Freq: Every day | SUBCUTANEOUS | Status: DC
  Administered 2016-01-07: 15 [IU] via SUBCUTANEOUS
  Filled 2016-01-07 (×2): qty 0.15

## 2016-01-07 MED ORDER — ALPRAZOLAM 0.5 MG PO TABS
1.0000 mg | ORAL_TABLET | Freq: Three times a day (TID) | ORAL | Status: DC | PRN
  Administered 2016-01-07 – 2016-01-08 (×3): 1 mg via ORAL
  Filled 2016-01-07 (×4): qty 2

## 2016-01-07 MED ORDER — SODIUM CHLORIDE 0.9 % IV SOLN
INTRAVENOUS | Status: DC
  Administered 2016-01-07 – 2016-01-08 (×2): via INTRAVENOUS

## 2016-01-07 MED ORDER — IPRATROPIUM-ALBUTEROL 0.5-2.5 (3) MG/3ML IN SOLN: 3.0000 mL | RESPIRATORY_TRACT | Status: DC | PRN

## 2016-01-07 NOTE — Progress Notes (Signed)
Patient ID: [name], female   DOB: 12/02/1960, 55 y.o.   MRN: [id number]    PROGRESS NOTE    [name]  WUJ:[id number] DOB: 02-18-1961 DOA: 12/22/2015  PCP: [name], MD   Outpatient Specialists:   Brief Narrative:  55 year old woman admitted on 03/31 with acute HF exacerbation 2/2 non-compliance with diuretic therapy admitted to ICU on 04/03 for acute hypoxic respiratory failure due to ARDS due to aspiration.   SIGNIFICANT EVENTS: 3/31 Admit 4/03 VDRF >> to ICU, ARDS protocol, start nimbex 4/05 d/c nimbex 4/10 to precedex, depakote added 4/14 [facility] assumed care from [facility] 4/16 more agitation of unclear etiology, confusion, ? Delirium   Assessment & Plan: Acute hypoxic respiratory failure, VDRF - ARDS 2/2 aspiration, AECOPD - Continue steroid and scheduled duonebs. - CXR 4/15 noting improvement in vascular congestion  - added antitussive and it seems to be helping  - pt maintaining oxygen saturation at target range   Acute on chronic diastolic HF  - weight was 226 lbs on admission --> 220 --> 215 --> 212 lbs this AM  - continue to monitor daily weights, I/O - still with mild crackles at bases but no signs of significant volume overload   Severe MS 2/2 rheumatic heart disease - continue Prednisone   HTN, essential - reasonably stable this AM  - continue metoprolol  - Can likely resume ACEI soon  Acute kidney injury  - resolved, Cr has remained WNL in the past 48 hours   Hypernatremia - free H20 given, sodium stable and WNL this AM  - BMP In AM  Hypokalemia - resolved   Loose stools - resolved   Leukocytosis 2/2 to aspiration PNA and steroids - improving overall, completed course of ABX - CXR 4/15 with no signs of PNA - WBC trending down in the past 24 hours   Anemia of critical illness in the setting of AoCD  - Hg overall stable, no signs of bleeding  - CBC in AM  DM type 2 on long term insulin and complications of neuropathy  - Lantus  to 50 units QHS - changed SSI from mod --> sensitive scale   Hypothyroidism   - Continue Levothyroxine, repeat TSH 6.475  Acute encephalopathy - initially present on admission and resolved but worse this AM 4/16 - keep in SDU for now and monitor   DVT prophylaxis: Lovenox SQ  Code Status: Full Family Communication: Patient at bedside  Disposition Plan: IR by 4/18  Consultants:   IR  Procedures:   4/04 Echo >> EF 60 to 65%, grade 2 diastolic CHF, severe MS  4/10 CT head > Stable diffuse cortical atrophy. Mild chronic ischemic white matter disease. No acute intracranial abnormality seen.  4/10 EEG > abnormal with diffuse slowing c/w several metabolic abcormalitites  Antimicrobials:   CULTURES: 4/03 Sputum >> rare yeast c/w candida 4/04 Influenza PCR >> negative 4/04 Respiratory viral panel >> negative   ANTIBIOTICS: 4/01 Doxycycline >> 4/03 4/03 Vancomycin >> 04/06 4/03 Zosyn >> 04/10  Subjective: Pt more confused and agitated this AM.   Objective: Filed Vitals:   01/07/16 0500 01/07/16 0600 01/07/16 0700 01/07/16 0830  BP:    137/70  Pulse: 72 65 68 72  Temp:    98.1 F (36.7 C)  TempSrc:    Oral  Resp: Height:      Weight: 96.525 kg (212 lb 12.8 oz)     SpO2: 100% 100% 93% 100%  Intake/Output Summary (Last 24 hours) at 01/07/16 1245 Last data filed at 01/07/16 1000  Gross per 24 hour  Intake    930 ml  Output    550 ml  Net    380 ml   Filed Weights   01/05/16 0500 01/06/16 0500 01/07/16 0500  Weight: 96.9 kg (213 lb 10 oz) 97 kg (213 lb 13.5 oz) 96.525 kg (212 lb 12.8 oz)    Examination:  General exam: Appears confused, agitated, refuses exam this AM, was able to listen to heart and lungs, abd Respiratory system: scattered rhonchi Respiratory effort normal. Cardiovascular system: S1 & S2 heard, RRR. No JVD, murmurs, rubs, gallops or clicks. No pedal edema. Gastrointestinal system: Abdomen is nondistended, soft and nontender.    Central nervous system: Agitated, alert, follows some commands, moving all 4 extremities   Data Reviewed: I have personally reviewed following labs and imaging studies  CBC:  Recent Labs Lab 01/03/16 0455 01/04/16 0408 01/05/16 0429 01/06/16 0400 01/07/16 0348  WBC 12.3* 10.7* 12.4* 12.3* 11.9*  HGB 8.4* 8.1* 8.5* 8.3* 8.9*  HCT 27.5* 28.4* 30.6* 29.0* 30.8*  MCV 81.6 83.0 83.6 82.4 81.9  PLT 199 200 251 206 301   Basic Metabolic Panel:  Recent Labs Lab 01/02/16 0415 01/03/16 0455 01/04/16 0408 01/05/16 0429 01/06/16 0400 01/07/16 0348  NA 145 147* 147* 149* 141 141  K 3.5 2.8* 3.1* 3.4* 3.2* 4.2  CL 100* 99* 99* 99* 93* 94*  CO2 34* 36* 39* 40* 39* 39*  GLUCOSE 347* 152* 121* 130* 117* 78  BUN 30* 30* 29* 34* 24* 17  CREATININE 0.69 0.62 0.64 0.69 0.60 0.63  CALCIUM 8.9 9.2 9.0 9.3 9.3 9.2  MG 2.0 1.8 2.0 2.0  --   --   PHOS 2.6 2.9 4.0 3.7  --   --     Recent Labs Lab 01/01/16 2211  AMMONIA 31   CBG:  Recent Labs Lab 01/06/16 2234 01/07/16 0342 01/07/16 0726 01/07/16 0830 01/07/16 1158  GLUCAP 140* 78 50* 77 80   Lipid Profile:  Recent Labs  01/05/16 0429 01/07/16 0348  TRIG 80 76   Urine analysis:    Component Value Date/Time   COLORURINE YELLOW 12/22/2015 2212   APPEARANCEUR CLOUDY* 12/22/2015 2212   LABSPEC 1.014 12/22/2015 2212   PHURINE 5.0 12/22/2015 2212   GLUCOSEU NEGATIVE 12/22/2015 2212   HGBUR NEGATIVE 12/22/2015 2212   BILIRUBINUR NEGATIVE 12/22/2015 2212   KETONESUR NEGATIVE 12/22/2015 2212   PROTEINUR NEGATIVE 12/22/2015 2212   UROBILINOGEN 0.2 07/29/2015 1948   NITRITE NEGATIVE 12/22/2015 2212   LEUKOCYTESUR TRACE* 12/22/2015 2212    Recent Results (from the past 240 hour(s))  C difficile quick scan w PCR reflex     Status: None   Collection Time: 12/29/15 12:09 AM  Result Value Ref Range Status   C Diff antigen NEGATIVE NEGATIVE Final   C Diff toxin NEGATIVE NEGATIVE Final   C Diff interpretation Negative for  toxigenic C. difficile  Final      Radiology Studies: Dg Chest Port 1 View 01/04/2016 Lines and tubes in stable position. 2. Persistent changes of congestive heart failure with bilateral pulmonary interstitial edema. Basilar atelectasis and/or infiltrates again noted .  Scheduled Meds: . ARIPiprazole  5 mg Oral Daily  . aspirin  81 mg Oral Daily  . divalproex  250 mg Oral Q12H  . enoxaparin  injection  40 mg Subcutaneous Daily  . insulin glargine  50 Units Subcutaneous QHS  . ipratropium-albuterol  3 mL Nebulization BID  . levothyroxine  125 mcg Oral QAC breakfast  . metoprolol tartrate  12.5 mg Oral BID  . pantoprazole  40 mg Oral Q breakfast  . predniSONE  10 mg Oral Q breakfast  . simvastatin  10 mg Oral q1800   Continuous Infusions:   LOS: 15 days   Time spent: 20 minutes   Debbora PrestoMAGICK-MYERS, ISKRA, MD [facility] Pager [phone]  If 7PM-7AM, please contact night-coverage [email or web address] Password [email or web address] 01/07/2016, 12:45 PM

## 2016-01-07 NOTE — Progress Notes (Signed)
Pt suffered hypoglycemia episode. Resolved with 2 cranberry juices and chocolate pudding. MS unchanged t/o.

## 2016-01-07 NOTE — Progress Notes (Signed)
Pt has had no UO this shift. Bladder scan showed 0ML. While attempting to feed lunch, pt had several coughing/gurgling episodes, so ceased attempt. Dr Lenise ArenaMeyers notified of both.
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
## 2016-01-07 NOTE — Progress Notes (Signed)
Bladder scan performed with greater than 300cc viewed, patient was able to void after scanning.

## 2016-01-07 NOTE — Progress Notes (Signed)
Pt inc of urine in bed. Subsequently bladder scan showed 48mL residual. Pt encouraged to call out for bedpan and PO fluids encouraged.
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
## 2016-01-08 ENCOUNTER — Inpatient Hospital Stay (HOSPITAL_COMMUNITY): Payer: Medicaid Other

## 2016-01-08 LAB — CBC
HCT: 28 % — ABNORMAL LOW (ref 36.0–46.0)
Hemoglobin: 8.4 g/dL — ABNORMAL LOW (ref 12.0–15.0)
MCH: 24.9 pg — AB (ref 26.0–34.0)
MCHC: 30 g/dL (ref 30.0–36.0)
MCV: 82.8 fL (ref 78.0–100.0)
PLATELETS: 219 10*3/uL (ref 150–400)
RBC: 3.38 MIL/uL — AB (ref 3.87–5.11)
RDW: 17 % — ABNORMAL HIGH (ref 11.5–15.5)
WBC: 9.1 10*3/uL (ref 4.0–10.5)

## 2016-01-08 LAB — GLUCOSE, CAPILLARY
GLUCOSE-CAPILLARY: 77 mg/dL (ref 65–99)
GLUCOSE-CAPILLARY: 141 mg/dL — AB (ref 65–99)
GLUCOSE-CAPILLARY: 117 mg/dL — AB (ref 65–99)
GLUCOSE-CAPILLARY: 132 mg/dL — AB (ref 65–99)
Glucose-Capillary: 37 mg/dL — CL (ref 65–99)
Glucose-Capillary: 251 mg/dL — ABNORMAL HIGH (ref 65–99)
Glucose-Capillary: 218 mg/dL — ABNORMAL HIGH (ref 65–99)

## 2016-01-08 LAB — BASIC METABOLIC PANEL
ANION GAP: 5 (ref 5–15)
BUN: 10 mg/dL (ref 6–20)
CALCIUM: 8.8 mg/dL — AB (ref 8.9–10.3)
CO2: 40 mmol/L — ABNORMAL HIGH (ref 22–32)
Chloride: 100 mmol/L — ABNORMAL LOW (ref 101–111)
Creatinine, Ser: 0.68 mg/dL (ref 0.44–1.00)
GFR calc Af Amer: >60 mL/min (ref >60)
Glucose, Bld: 77 mg/dL (ref 65–99)
POTASSIUM: 3.9 mmol/L (ref 3.5–5.1)
SODIUM: 145 mmol/L (ref 135–145)

## 2016-01-08 LAB — VALPROIC ACID LEVEL: Valproic Acid Lvl: 22 ug/mL — ABNORMAL LOW (ref 50.0–100.0)

## 2016-01-08 MED ORDER — DEXTROSE 50 % IV SOLN
1.0000 | Freq: Once | INTRAVENOUS | Status: AC
  Administered 2016-01-08: 50 mL via INTRAVENOUS

## 2016-01-08 MED ORDER — DEXTROSE 50 % IV SOLN
INTRAVENOUS | Status: AC
  Filled 2016-01-08: qty 50

## 2016-01-08 MED ORDER — INSULIN GLARGINE 100 UNIT/ML ~~LOC~~ SOLN
10.0000 [IU] | Freq: Every day | SUBCUTANEOUS | Status: DC
  Administered 2016-01-08: 10 [IU] via SUBCUTANEOUS
  Filled 2016-01-08 (×2): qty 0.1

## 2016-01-08 MED ORDER — IPRATROPIUM-ALBUTEROL 0.5-2.5 (3) MG/3ML IN SOLN
3.0000 mL | Freq: Four times a day (QID) | RESPIRATORY_TRACT | Status: DC
  Administered 2016-01-08 – 2016-01-09 (×4): 3 mL via RESPIRATORY_TRACT
  Filled 2016-01-08 (×4): qty 3

## 2016-01-08 NOTE — Progress Notes (Signed)
Speech Language Pathology Treatment: Dysphagia  Patient Details Name: Evaristo BuryMary J Loser MRN: [id number] DOB: 1961/02/17 Today's Date: 01/08/2016 Time: [id number] SLP Time Calculation (min) (ACUTE ONLY): 9 min  Assessment / Plan / Recommendation Clinical Impression  F/u after repeated swallow eval this weekend.  Pt more drowsy than when evaluated 4/14.  She c/o "not feeling like myself."  Improved overall toleration of POs per RN.  Pt observed to consume soft solids, thin liquids with no overt s/s of aspiration; RR remains compatible with proper swallow/respiratory sequence.  There is presence of audible wheeze.  No new CXR since 4/15.  Min cues overall for self-feeding and rate/quantity.  Recommend continuing current diet (dysphagia 3, thin liquids) with meds whole in puree.  SLP will continue to follow for tolerance and to determine necessity of MBS.     HPI HPI: 55 year old woman admitted on 03/31 with acute HF exacerbation 2/2 non-compliance with diuretic therapy. The patient was doing fairly well with diuresis until 12/25/15 when she developed significant respiratory distress. Her O2 sats were in the 70s on 100% NRB and high flow [place]. She was intolerant of BiPAP and the critical care team was subsequently consulted for intubation.  She vomited large volume of emesis during intubation and subsequently had what appeared to be significant aspiration event. Pt intubated from 4/3-4/12. Pt was seen for BSE on 4/13 and made NPO due to signs of acute reversible dysphagia following intubation. On 4/14 pt showed improvement and was started on dys 3/thin diet and SLP signed off. Then on 4/16 pt observed to have difficutly with meal, coughing and gurgling. SLP reordered. CXR on 4/15 shows no new acute process.       SLP Plan  Continue with current plan of care     Recommendations  Diet recommendations: Dysphagia 3 (mechanical soft);Thin liquid Liquids provided via: Cup;Straw Medication Administration: Whole  meds with puree Supervision: Staff to assist with self feeding Compensations: Minimize environmental distractions;Slow rate;Small sips/bites;Follow solids with liquid Postural Changes and/or Swallow Maneuvers: Seated upright 90 degrees;Upright 30-60 min after meal             Oral Care Recommendations: Oral care BID Follow up Recommendations: Inpatient Rehab Plan: Continue with current plan of care     GO                Blenda MountsCouture, Kayin Kettering Laurice 01/08/2016, 12:00 PM

## 2016-01-08 NOTE — Progress Notes (Signed)
Physical Therapy Treatment Patient Details Name: Elizabeth BuryMary J [name] MRN: [id number] DOB: October 10, 1960 Today's Date: 01/08/2016    History of Present Illness 55 year old woman admitted on 03/31 with acute HF exacerbation 2/2 non-compliance with diuretic therapy. The patient was doing fairly well with diuresis until 12/25/15 when she developed significant respiratory distress. Her O2 sats were in the 70s on 100% NRB and high flow [place]. She was intolerant of BiPAP and the critical care team was subsequently consulted for intubation. She vomited large volume of emesis during intubation and subsequently had what appeared to be significant aspiration event. Pt intubated from 4/3-4/12    PT Comments    Patient very motivated and wanted to be able to transfer OOB to chair. Due to her short stature and weakness, unable to complete transfer today. RN agreed to order a low bed for pt. Pt became tearful when discussing her desire to return home, however acknowledges she is not currently strong enough.    Follow Up Recommendations  CIR;Supervision/Assistance - 24 hour     Equipment Recommendations   (TBD)    Recommendations for Other Services OT consult     Precautions / Restrictions Precautions Precautions: Fall Required Braces or Orthoses: Other Brace/Splint Other Brace/Splint: bil orthopedic shoes in closet Restrictions Weight Bearing Restrictions: No    Mobility  Bed Mobility Overal bed mobility: Needs Assistance;+2 for physical assistance;+ 2 for safety/equipment Bed Mobility: Supine to Sit;Sit to Supine     Supine to sit: HOB elevated;Mod assist;+2 for physical assistance Sit to supine: Mod assist;+2 for physical assistance   General bed mobility comments: patient eager to attempt EOB and transfers; pt able to assist with all aspects of mobility, requires assist due to weakness  Transfers Overall transfer level: Needs assistance Equipment used: 2 person hand held assist Transfers: Sit  to/from Stand;Lateral/Scoot Transfers Sit to Stand: Mod assist;+2 safety/equipment;+2 physical assistance;From elevated surface (ICU bed at lowest height)        Lateral/Scoot Transfers: Mod assist;+2 safety/equipment;+2 physical assistance General transfer comment: unable to fully stand; able to assist with lateral scoot/side step  Ambulation/Gait             General Gait Details: unable   Stairs            Wheelchair Mobility    Modified Rankin (Stroke Patients Only)       Balance   Sitting-balance support: No upper extremity supported;Feet unsupported Sitting balance-Leahy Scale: Fair                              Cognition Arousal/Alertness: Awake/alert Behavior During Therapy: Anxious Overall Cognitive Status: No family/caregiver present to determine baseline cognitive functioning (reports she has been w/c level at home (?accuracy))                      Exercises General Exercises - Lower Extremity Quad Sets: AROM;Both;5 reps;Supine Long Arc Quad: AROM;Both;5 reps Straight Leg Raises: AAROM;Both;Other reps (comment) (3 reps) Other Exercises Other Exercises: hip internal rotation in supine (pt tends to position herself with legs abducted and externally rotated)    General Comments General comments (skin integrity, edema, etc.): Asked RN for Low-bed due to pt's short stature (will incr ease of getting OOB).       Pertinent Vitals/Pain Pain Assessment: No/denies pain    Home Living  Prior Function    Gait / Transfers Assistance Needed: Pt now reports she used hemiwalker to stand-pivot to w/c and then used w/c to get around house.        PT Goals (current goals can now be found in the care plan section) Acute Rehab PT Goals Patient Stated Goal: wants to go home, not SNF PT Goal Formulation: With patient Time For Goal Achievement: 01/18/16 Potential to Achieve Goals: Good Progress towards PT goals:  Progressing toward goals    Frequency  Min 3X/week    PT Plan Current plan remains appropriate    Co-evaluation             End of Session Equipment Utilized During Treatment: Oxygen;Gait belt;Other (comment) (specialty shoes) Activity Tolerance: Patient limited by fatigue Patient left: in bed;with call bell/phone within reach;with SCD's reapplied     Time: 7829-5621 PT Time Calculation (min) (ACUTE ONLY): 27 min  Charges:  $Therapeutic Exercise: 8-22 mins $Therapeutic Activity: 8-22 mins                    G Codes:      Add Dinapoli 02-03-2016, 4:10 PM Pager [phone]

## 2016-01-08 NOTE — Progress Notes (Signed)
Rehab admissions - I met with patient this am.  Patient needs to be able to tolerate up in chair at least 1 hour at a time and be able to participate more fully with therapies.  Will follow for progress.  Call me for questions.  #[phone]

## 2016-01-08 NOTE — Progress Notes (Addendum)
Patient ID: [name], female   DOB: Oct 14, 1960, 55 y.o.   MRN: [id number]    PROGRESS NOTE    KEIRAH KONITZER  NFA:[id number] DOB: 12/24/1960 DOA: 12/22/2015  PCP: [name], MD   Outpatient Specialists:   Brief Narrative:  55 year old woman admitted on 03/31 with acute HF exacerbation 2/2 non-compliance with diuretic therapy admitted to ICU on 04/03 for acute hypoxic respiratory failure due to ARDS due to aspiration.   SIGNIFICANT EVENTS: 3/31 Admit 4/03 VDRF >> to ICU, ARDS protocol, start nimbex 4/05 d/c nimbex 4/10 to precedex, depakote added 4/14 [facility] assumed care from [facility] 4/16 more agitation of unclear etiology, confusion, ? 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 - continue Prednisone   HTN, essential - stable this AM but was slightly on low side yesterday - continue metoprolol, monitor closely  - Can likely resume ACEI soon  Acute kidney injury  - resolved, Cr has remained WNL in the past 72 hours   Hypernatremia - resolved, BMP in AM  Hypokalemia - resolved and WNL this AM  Loose stools - resolved   Leukocytosis 2/2 to aspiration PNA and steroids - completed course of ABX - CXR 4/15 with no signs of PNA - WBC trending down and WNL this AM   Anemia of critical illness in the setting of AoCD  - Hg overall stable, no signs of bleeding  - CBC in AM  DM type 2 on long term insulin and complications of neuropathy  - Lantus to 50 units QHS initially but due  to hypoglycemic events, dose was lowered  - changed SSI from mod --> sensitive scale   Hypothyroidism   - Continue Levothyroxine, repeat TSH 6.475  Acute encephalopathy - initially present on admission and resolved but worse AM 4/16 - somewhat better this AM but not as clear as few days ago   DVT prophylaxis: Lovenox SQ  Code Status: Full Family Communication: Patient at bedside  Disposition Plan: IR by 4/19 if pt's mental status improves   Consultants:   IR  Procedures:   4/04 Echo >> EF 60 to 65%, grade 2 diastolic CHF, severe MS  4/10 CT head > Stable diffuse cortical atrophy. Mild chronic ischemic white matter disease. No acute intracranial abnormality seen.  4/10 EEG > abnormal with diffuse slowing c/w several metabolic abcormalitites  Antimicrobials:   CULTURES: 4/03 Sputum >> rare yeast c/w candida 4/04 Influenza PCR >> negative 4/04 Respiratory viral panel >> negative   ANTIBIOTICS: 4/01 Doxycycline >> 4/03 4/03 Vancomycin >> 04/06 4/03 Zosyn >> 04/10  Subjective: Pt still confused, no agitation.   Objective: Filed Vitals:   01/08/16 0800 01/08/16 0900 01/08/16 1000 01/08/16 1211  BP: 92/46  109/58 128/60  Pulse: 57 59  64  Temp: 98 F (36.7 C)   97.8 F (36.6 C)  TempSrc: Oral   Oral  Resp: Height:      Weight:  SpO2: 97% 100%  100%    Intake/Output Summary (Last 24 hours) at 01/08/16 1307 Last data filed at 01/08/16 1100  Gross per 24 hour  Intake   1830 ml  Output      0 ml  Net   1830 ml   Filed Weights   01/05/16 0500 01/06/16 0500 01/07/16 0500  Weight: 96.9 kg (213 lb 10 oz) 97 kg (213 lb 13.5 oz) 96.525 kg (212 lb 12.8 oz)    Examination:  General exam: Appears still slightly confused with slow mentation  Respiratory system: diminished breath sounds at bases with exp wheezing noted  Cardiovascular system: S1 & S2 heard, RRR. No JVD, murmurs, rubs, gallops or clicks. No pedal edema. Gastrointestinal system:  Abdomen is nondistended, soft and nontender.  Central nervous system: Confused intermittently but able to follow some commands   Data Reviewed: I have personally reviewed following labs and imaging studies  CBC:  Recent Labs Lab 01/04/16 0408 01/05/16 0429 01/06/16 0400 01/07/16 0348 01/08/16 0415  WBC 10.7* 12.4* 12.3* 11.9* 9.1  HGB 8.1* 8.5* 8.3* 8.9* 8.4*  HCT 28.4* 30.6* 29.0* 30.8* 28.0*  MCV 83.0 83.6 82.4 81.9 82.8  PLT 200 251 206 301 219   Basic Metabolic Panel:  Recent Labs Lab 01/02/16 0415 01/03/16 0455 01/04/16 0408 01/05/16 0429 01/06/16 0400 01/07/16 0348 01/08/16 0415  NA 145 147* 147* 149* 141 141 145  K 3.5 2.8* 3.1* 3.4* 3.2* 4.2 3.9  CL 100* 99* 99* 99* 93* 94* 100*  CO2 34* 36* 39* 40* 39* 39* 40*  GLUCOSE 347* 152* 121* 130* 117* 78 77  BUN 30* 30* 29* 34* 24* 17 10  CREATININE 0.69 0.62 0.64 0.69 0.60 0.63 0.68  CALCIUM 8.9 9.2 9.0 9.3 9.3 9.2 8.8*  MG 2.0 1.8 2.0 2.0  --   --   --   PHOS 2.6 2.9 4.0 3.7  --   --   --     Recent Labs Lab 01/01/16 2211  AMMONIA 31   CBG:  Recent Labs Lab 01/08/16 0413 01/08/16 0745 01/08/16 0807 01/08/16 0831 01/08/16 1211  GLUCAP 77 37* 141* 117* 132*   Lipid Profile:  Recent Labs  01/07/16 0348  TRIG 76   Urine analysis:    Component Value Date/Time   COLORURINE YELLOW 12/22/2015 2212   APPEARANCEUR CLOUDY* 12/22/2015 2212   LABSPEC 1.014 12/22/2015 2212   PHURINE 5.0 12/22/2015 2212   GLUCOSEU NEGATIVE 12/22/2015 2212   HGBUR NEGATIVE 12/22/2015 2212   BILIRUBINUR NEGATIVE 12/22/2015 2212   KETONESUR NEGATIVE 12/22/2015 2212   PROTEINUR NEGATIVE 12/22/2015 2212   UROBILINOGEN 0.2 07/29/2015 1948   NITRITE NEGATIVE 12/22/2015 2212   LEUKOCYTESUR TRACE* 12/22/2015 2212   Radiology Studies: Dg Chest Port 1 View 01/04/2016 Lines and tubes in stable position. 2. Persistent changes of congestive heart failure with bilateral pulmonary interstitial edema. Basilar atelectasis and/or  infiltrates again noted   Scheduled medications:  . antiseptic oral rinse  7 mL Mouth Rinse BID  . ARIPiprazole  5 mg Oral Daily  . aspirin  81 mg Oral Daily  . divalproex  250 mg Oral Q12H  . enoxaparin (LOVENOX) injection  40 mg Subcutaneous Daily  . guaiFENesin  600 mg Oral BID  . insulin aspart  0-9 Units Subcutaneous TID WC  . insulin glargine  15 Units Subcutaneous QHS  . levothyroxine  125 mcg Oral QAC breakfast  . metoprolol tartrate  12.5 mg Oral BID  . multivitamin with minerals  1  tablet Oral Daily  . pantoprazole  40 mg Oral Q breakfast  . predniSONE  10 mg Oral Q breakfast  . simvastatin  10 mg Oral q1800  . sodium chloride flush  10-40 mL Intracatheter Q12H     Continuous Infusions:   LOS: 16 days   Time spent: 20 minutes   Debbora PrestoMAGICK-Kailo Kosik, MD [facility] Pager [phone]  If 7PM-7AM, please contact night-coverage [email or web address] Password [email or web address] 01/08/2016, 1:07 PM

## 2016-01-08 NOTE — Progress Notes (Signed)
Xanax 1mg  po adm to patient for continuous yelling out, is alert to self, can tell me she is at [place] and even can tell me the year. Will scream out I want to go home the I want my head up and then I want my head down. Emotional support given.

## 2016-01-09 ENCOUNTER — Encounter (HOSPITAL_COMMUNITY): Payer: Self-pay | Admitting: *Deleted

## 2016-01-09 ENCOUNTER — Inpatient Hospital Stay (HOSPITAL_COMMUNITY)
Admission: RE | Admit: 2016-01-09 | Discharge: 2016-01-11 | DRG: 947 | Disposition: A | Discharge status: Other Acute Inpatient Hospital | Payer: Medicaid Other | Source: Intra-hospital | Attending: Physical Medicine & Rehabilitation | Admitting: Physical Medicine & Rehabilitation

## 2016-01-09 DIAGNOSIS — E7800 Pure hypercholesterolemia, unspecified: Secondary | ICD-10-CM | POA: Diagnosis present

## 2016-01-09 DIAGNOSIS — Z886 Allergy status to analgesic agent status: Secondary | ICD-10-CM

## 2016-01-09 DIAGNOSIS — I110 Hypertensive heart disease with heart failure: Secondary | ICD-10-CM | POA: Diagnosis present

## 2016-01-09 DIAGNOSIS — E11649 Type 2 diabetes mellitus with hypoglycemia without coma: Secondary | ICD-10-CM | POA: Diagnosis not present

## 2016-01-09 DIAGNOSIS — E1165 Type 2 diabetes mellitus with hyperglycemia: Secondary | ICD-10-CM | POA: Diagnosis present

## 2016-01-09 DIAGNOSIS — E1129 Type 2 diabetes mellitus with other diabetic kidney complication: Secondary | ICD-10-CM | POA: Diagnosis present

## 2016-01-09 DIAGNOSIS — E11622 Type 2 diabetes mellitus with other skin ulcer: Secondary | ICD-10-CM | POA: Diagnosis present

## 2016-01-09 DIAGNOSIS — R1310 Dysphagia, unspecified: Secondary | ICD-10-CM | POA: Diagnosis present

## 2016-01-09 DIAGNOSIS — R0602 Shortness of breath: Secondary | ICD-10-CM

## 2016-01-09 DIAGNOSIS — E039 Hypothyroidism, unspecified: Secondary | ICD-10-CM | POA: Diagnosis present

## 2016-01-09 DIAGNOSIS — Z7951 Long term (current) use of inhaled steroids: Secondary | ICD-10-CM

## 2016-01-09 DIAGNOSIS — G9340 Encephalopathy, unspecified: Secondary | ICD-10-CM

## 2016-01-09 DIAGNOSIS — F411 Generalized anxiety disorder: Secondary | ICD-10-CM | POA: Diagnosis present

## 2016-01-09 DIAGNOSIS — Z7982 Long term (current) use of aspirin: Secondary | ICD-10-CM

## 2016-01-09 DIAGNOSIS — G9341 Metabolic encephalopathy: Secondary | ICD-10-CM | POA: Diagnosis present

## 2016-01-09 DIAGNOSIS — R0902 Hypoxemia: Secondary | ICD-10-CM | POA: Diagnosis not present

## 2016-01-09 DIAGNOSIS — G8929 Other chronic pain: Secondary | ICD-10-CM | POA: Diagnosis present

## 2016-01-09 DIAGNOSIS — Z96612 Presence of left artificial shoulder joint: Secondary | ICD-10-CM | POA: Diagnosis present

## 2016-01-09 DIAGNOSIS — M549 Dorsalgia, unspecified: Secondary | ICD-10-CM | POA: Diagnosis present

## 2016-01-09 DIAGNOSIS — Z6841 Body Mass Index (BMI) 40.0 and over, adult: Secondary | ICD-10-CM | POA: Diagnosis not present

## 2016-01-09 DIAGNOSIS — I509 Heart failure, unspecified: Secondary | ICD-10-CM

## 2016-01-09 DIAGNOSIS — G629 Polyneuropathy, unspecified: Secondary | ICD-10-CM | POA: Diagnosis present

## 2016-01-09 DIAGNOSIS — J449 Chronic obstructive pulmonary disease, unspecified: Secondary | ICD-10-CM | POA: Diagnosis present

## 2016-01-09 DIAGNOSIS — I252 Old myocardial infarction: Secondary | ICD-10-CM | POA: Diagnosis not present

## 2016-01-09 DIAGNOSIS — F4001 Agoraphobia with panic disorder: Secondary | ICD-10-CM | POA: Diagnosis present

## 2016-01-09 DIAGNOSIS — R5381 Other malaise: Secondary | ICD-10-CM | POA: Diagnosis present

## 2016-01-09 DIAGNOSIS — R339 Retention of urine, unspecified: Secondary | ICD-10-CM | POA: Diagnosis present

## 2016-01-09 DIAGNOSIS — L98499 Non-pressure chronic ulcer of skin of other sites with unspecified severity: Secondary | ICD-10-CM | POA: Diagnosis present

## 2016-01-09 DIAGNOSIS — J441 Chronic obstructive pulmonary disease with (acute) exacerbation: Secondary | ICD-10-CM | POA: Diagnosis present

## 2016-01-09 DIAGNOSIS — K219 Gastro-esophageal reflux disease without esophagitis: Secondary | ICD-10-CM | POA: Diagnosis present

## 2016-01-09 DIAGNOSIS — J9692 Respiratory failure, unspecified with hypercapnia: Secondary | ICD-10-CM | POA: Diagnosis not present

## 2016-01-09 DIAGNOSIS — Z87891 Personal history of nicotine dependence: Secondary | ICD-10-CM

## 2016-01-09 DIAGNOSIS — Z79899 Other long term (current) drug therapy: Secondary | ICD-10-CM

## 2016-01-09 DIAGNOSIS — F3170 Bipolar disorder, currently in remission, most recent episode unspecified: Secondary | ICD-10-CM | POA: Diagnosis present

## 2016-01-09 DIAGNOSIS — I5033 Acute on chronic diastolic (congestive) heart failure: Secondary | ICD-10-CM | POA: Diagnosis present

## 2016-01-09 DIAGNOSIS — J9811 Atelectasis: Secondary | ICD-10-CM | POA: Diagnosis present

## 2016-01-09 DIAGNOSIS — Z9981 Dependence on supplemental oxygen: Secondary | ICD-10-CM

## 2016-01-09 DIAGNOSIS — Z8249 Family history of ischemic heart disease and other diseases of the circulatory system: Secondary | ICD-10-CM

## 2016-01-09 DIAGNOSIS — IMO0002 Reserved for concepts with insufficient information to code with codable children: Secondary | ICD-10-CM | POA: Diagnosis present

## 2016-01-09 DIAGNOSIS — Z888 Allergy status to other drugs, medicaments and biological substances status: Secondary | ICD-10-CM

## 2016-01-09 DIAGNOSIS — Z794 Long term (current) use of insulin: Secondary | ICD-10-CM

## 2016-01-09 DIAGNOSIS — E1121 Type 2 diabetes mellitus with diabetic nephropathy: Secondary | ICD-10-CM

## 2016-01-09 DIAGNOSIS — I10 Essential (primary) hypertension: Secondary | ICD-10-CM | POA: Diagnosis present

## 2016-01-09 LAB — CBC
HCT: 31 % — ABNORMAL LOW (ref 36.0–46.0)
Hemoglobin: 8.8 g/dL — ABNORMAL LOW (ref 12.0–15.0)
MCH: 23.7 pg — ABNORMAL LOW (ref 26.0–34.0)
MCHC: 28.4 g/dL — ABNORMAL LOW (ref 30.0–36.0)
MCV: 83.3 fL (ref 78.0–100.0)
Platelets: 225 10*3/uL (ref 150–400)
RBC: 3.72 MIL/uL — ABNORMAL LOW (ref 3.87–5.11)
RDW: 17.1 % — AB (ref 11.5–15.5)
WBC: 7.8 10*3/uL (ref 4.0–10.5)

## 2016-01-09 LAB — BASIC METABOLIC PANEL
ANION GAP: 8 (ref 5–15)
BUN: 8 mg/dL (ref 6–20)
CALCIUM: 9.2 mg/dL (ref 8.9–10.3)
CO2: 36 mmol/L — ABNORMAL HIGH (ref 22–32)
Chloride: 100 mmol/L — ABNORMAL LOW (ref 101–111)
Creatinine, Ser: 0.72 mg/dL (ref 0.44–1.00)
GLUCOSE: 194 mg/dL — AB (ref 65–99)
Potassium: 4.1 mmol/L (ref 3.5–5.1)
SODIUM: 144 mmol/L (ref 135–145)

## 2016-01-09 LAB — BLOOD GAS, ARTERIAL
Acid-Base Excess: 11.7 mmol/L — ABNORMAL HIGH (ref 0.0–2.0)
BICARBONATE: 37.1 meq/L — AB (ref 20.0–24.0)
DRAWN BY: 331471
O2 Content: 3 L/min
O2 SAT: 96.5 %
PATIENT TEMPERATURE: 98.6
PO2 ART: 88.7 mmHg (ref 80.0–100.0)
TCO2: 39 mmol/L (ref 0–100)
pCO2 arterial: 62.5 mmHg (ref 35.0–45.0)
pH, Arterial: 7.391 (ref 7.350–7.450)

## 2016-01-09 LAB — GLUCOSE, CAPILLARY
GLUCOSE-CAPILLARY: 203 mg/dL — AB (ref 65–99)
GLUCOSE-CAPILLARY: 352 mg/dL — AB (ref 65–99)
GLUCOSE-CAPILLARY: 271 mg/dL — AB (ref 65–99)
Glucose-Capillary: 169 mg/dL — ABNORMAL HIGH (ref 65–99)
Glucose-Capillary: 184 mg/dL — ABNORMAL HIGH (ref 65–99)

## 2016-01-09 LAB — TRIGLYCERIDES: Triglycerides: 84 mg/dL (ref <150)

## 2016-01-09 MED ORDER — PREDNISONE 5 MG PO TABS
5.0000 mg | ORAL_TABLET | Freq: Every day | ORAL | Status: DC
  Administered 2016-01-10 – 2016-01-11 (×2): 5 mg via ORAL
  Filled 2016-01-09 (×2): qty 1

## 2016-01-09 MED ORDER — ALPRAZOLAM 0.5 MG PO TABS
1.0000 mg | ORAL_TABLET | Freq: Three times a day (TID) | ORAL | Status: DC | PRN
  Administered 2016-01-09 – 2016-01-11 (×3): 1 mg via ORAL
  Filled 2016-01-09 (×3): qty 2

## 2016-01-09 MED ORDER — GUAIFENESIN-DM 100-10 MG/5ML PO SYRP: 5.0000 mL | ORAL_SOLUTION | ORAL | Status: DC | PRN

## 2016-01-09 MED ORDER — FUROSEMIDE 40 MG PO TABS: 40.0000 mg | ORAL_TABLET | Freq: Two times a day (BID) | ORAL | Status: AC

## 2016-01-09 MED ORDER — FUROSEMIDE 20 MG PO TABS: 20.0000 mg | ORAL_TABLET | Freq: Every day | ORAL | Status: DC

## 2016-01-09 MED ORDER — SENNOSIDES-DOCUSATE SODIUM 8.6-50 MG PO TABS: 1.0000 | ORAL_TABLET | Freq: Every evening | ORAL | Status: DC | PRN

## 2016-01-09 MED ORDER — DIVALPROEX SODIUM 250 MG PO DR TAB: 250.0000 mg | DELAYED_RELEASE_TABLET | Freq: Two times a day (BID) | ORAL | Status: AC

## 2016-01-09 MED ORDER — IPRATROPIUM-ALBUTEROL 0.5-2.5 (3) MG/3ML IN SOLN: 3.0000 mL | RESPIRATORY_TRACT | Status: DC | PRN

## 2016-01-09 MED ORDER — ALUM & MAG HYDROXIDE-SIMETH 200-200-20 MG/5ML PO SUSP: 30.0000 mL | ORAL | Status: DC | PRN

## 2016-01-09 MED ORDER — ASPIRIN 81 MG PO CHEW
81.0000 mg | CHEWABLE_TABLET | Freq: Every day | ORAL | Status: DC
  Administered 2016-01-10 – 2016-01-11 (×2): 81 mg via ORAL
  Filled 2016-01-09 (×2): qty 1

## 2016-01-09 MED ORDER — FLEET ENEMA 7-19 GM/118ML RE ENEM: 1.0000 | ENEMA | Freq: Once | RECTAL | Status: DC | PRN

## 2016-01-09 MED ORDER — GUAIFENESIN-DM 100-10 MG/5ML PO SYRP: 5.0000 mL | ORAL_SOLUTION | Freq: Four times a day (QID) | ORAL | Status: DC | PRN

## 2016-01-09 MED ORDER — GUAIFENESIN ER 600 MG PO TB12
600.0000 mg | ORAL_TABLET | Freq: Two times a day (BID) | ORAL | Status: DC
  Administered 2016-01-09 – 2016-01-11 (×4): 600 mg via ORAL
  Filled 2016-01-09 (×4): qty 1

## 2016-01-09 MED ORDER — INSULIN ASPART 100 UNIT/ML ~~LOC~~ SOLN
0.0000 [IU] | Freq: Three times a day (TID) | SUBCUTANEOUS | Status: DC
  Administered 2016-01-09: 9 [IU] via SUBCUTANEOUS
  Administered 2016-01-10: 2 [IU] via SUBCUTANEOUS
  Administered 2016-01-10: 3 [IU] via SUBCUTANEOUS
  Administered 2016-01-10: 7 [IU] via SUBCUTANEOUS
  Administered 2016-01-11 (×2): 9 [IU] via SUBCUTANEOUS

## 2016-01-09 MED ORDER — ADULT MULTIVITAMIN W/MINERALS CH
1.0000 | ORAL_TABLET | Freq: Every day | ORAL | Status: DC
  Administered 2016-01-10 – 2016-01-11 (×2): 1 via ORAL
  Filled 2016-01-09 (×2): qty 1

## 2016-01-09 MED ORDER — PREDNISONE 5 MG PO TABS: ORAL_TABLET | ORAL | Status: DC

## 2016-01-09 MED ORDER — FUROSEMIDE 40 MG PO TABS: 40.0000 mg | ORAL_TABLET | Freq: Every day | ORAL | Status: DC

## 2016-01-09 MED ORDER — GLUCERNA SHAKE PO LIQD
237.0000 mL | Freq: Two times a day (BID) | ORAL | Status: DC
  Administered 2016-01-09: 237 mL via ORAL

## 2016-01-09 MED ORDER — METOPROLOL TARTRATE 12.5 MG HALF TABLET
12.5000 mg | ORAL_TABLET | Freq: Two times a day (BID) | ORAL | Status: DC
  Administered 2016-01-09 – 2016-01-11 (×4): 12.5 mg via ORAL
  Filled 2016-01-09 (×4): qty 1

## 2016-01-09 MED ORDER — IPRATROPIUM-ALBUTEROL 0.5-2.5 (3) MG/3ML IN SOLN
3.0000 mL | Freq: Four times a day (QID) | RESPIRATORY_TRACT | Status: DC
Start: 2016-01-09 — End: 2016-03-02

## 2016-01-09 MED ORDER — IPRATROPIUM-ALBUTEROL 0.5-2.5 (3) MG/3ML IN SOLN
3.0000 mL | Freq: Four times a day (QID) | RESPIRATORY_TRACT | Status: DC
  Administered 2016-01-09 – 2016-01-11 (×7): 3 mL via RESPIRATORY_TRACT
  Filled 2016-01-09 (×8): qty 3

## 2016-01-09 MED ORDER — ONDANSETRON HCL 4 MG/2ML IJ SOLN: 4.0000 mg | Freq: Four times a day (QID) | INTRAMUSCULAR | Status: DC | PRN

## 2016-01-09 MED ORDER — PROCHLORPERAZINE MALEATE 5 MG PO TABS: 5.0000 mg | ORAL_TABLET | Freq: Four times a day (QID) | ORAL | Status: DC | PRN

## 2016-01-09 MED ORDER — ENOXAPARIN SODIUM 40 MG/0.4ML ~~LOC~~ SOLN
40.0000 mg | SUBCUTANEOUS | Status: DC
  Administered 2016-01-10 – 2016-01-11 (×2): 40 mg via SUBCUTANEOUS
  Filled 2016-01-09 (×2): qty 0.4

## 2016-01-09 MED ORDER — PANTOPRAZOLE SODIUM 40 MG PO TBEC
40.0000 mg | DELAYED_RELEASE_TABLET | Freq: Every day | ORAL | Status: DC
  Administered 2016-01-10 – 2016-01-11 (×2): 40 mg via ORAL
  Filled 2016-01-09 (×2): qty 1

## 2016-01-09 MED ORDER — IPRATROPIUM-ALBUTEROL 0.5-2.5 (3) MG/3ML IN SOLN: 3.0000 mL | RESPIRATORY_TRACT | Status: AC | PRN

## 2016-01-09 MED ORDER — INSULIN GLARGINE 100 UNIT/ML ~~LOC~~ SOLN: 10.0000 [IU] | Freq: Every day | SUBCUTANEOUS | Status: DC

## 2016-01-09 MED ORDER — SODIUM CHLORIDE 0.9% FLUSH
10.0000 mL | Freq: Two times a day (BID) | INTRAVENOUS | Status: DC
  Administered 2016-01-10: 10 mL

## 2016-01-09 MED ORDER — DIVALPROEX SODIUM 250 MG PO DR TAB
250.0000 mg | DELAYED_RELEASE_TABLET | Freq: Two times a day (BID) | ORAL | Status: DC
  Administered 2016-01-09 – 2016-01-11 (×4): 250 mg via ORAL
  Filled 2016-01-09 (×3): qty 1

## 2016-01-09 MED ORDER — LEVOTHYROXINE SODIUM 25 MCG PO TABS
125.0000 ug | ORAL_TABLET | Freq: Every day | ORAL | Status: DC
  Administered 2016-01-10 – 2016-01-11 (×2): 125 ug via ORAL
  Filled 2016-01-09 (×2): qty 1

## 2016-01-09 MED ORDER — FUROSEMIDE 40 MG PO TABS
40.0000 mg | ORAL_TABLET | Freq: Every day | ORAL | Status: DC
  Administered 2016-01-10 – 2016-01-11 (×2): 40 mg via ORAL
  Filled 2016-01-09 (×2): qty 1

## 2016-01-09 MED ORDER — PROCHLORPERAZINE EDISYLATE 5 MG/ML IJ SOLN: 5.0000 mg | Freq: Four times a day (QID) | INTRAMUSCULAR | Status: DC | PRN

## 2016-01-09 MED ORDER — ARIPIPRAZOLE 5 MG PO TABS
5.0000 mg | ORAL_TABLET | Freq: Every day | ORAL | Status: DC
  Administered 2016-01-10 – 2016-01-11 (×2): 5 mg via ORAL
  Filled 2016-01-09 (×2): qty 1

## 2016-01-09 MED ORDER — GLUCERNA SHAKE PO LIQD
237.0000 mL | Freq: Three times a day (TID) | ORAL | Status: DC
  Administered 2016-01-09 – 2016-01-11 (×5): 237 mL via ORAL

## 2016-01-09 MED ORDER — SIMVASTATIN 20 MG PO TABS
10.0000 mg | ORAL_TABLET | Freq: Every day | ORAL | Status: DC
  Administered 2016-01-09 – 2016-01-10 (×2): 10 mg via ORAL
  Filled 2016-01-09 (×2): qty 1

## 2016-01-09 MED ORDER — SODIUM CHLORIDE 0.9% FLUSH
10.0000 mL | Freq: Two times a day (BID) | INTRAVENOUS | Status: DC
  Administered 2016-01-11: 10 mL

## 2016-01-09 MED ORDER — BISACODYL 10 MG RE SUPP: 10.0000 mg | Freq: Every day | RECTAL | Status: DC | PRN

## 2016-01-09 MED ORDER — PROCHLORPERAZINE 25 MG RE SUPP: 12.5000 mg | Freq: Four times a day (QID) | RECTAL | Status: DC | PRN

## 2016-01-09 MED ORDER — GUAIFENESIN ER 600 MG PO TB12: 600.0000 mg | ORAL_TABLET | Freq: Two times a day (BID) | ORAL | Status: DC

## 2016-01-09 MED ORDER — INSULIN GLARGINE 100 UNIT/ML ~~LOC~~ SOLN
10.0000 [IU] | Freq: Every day | SUBCUTANEOUS | Status: DC
  Administered 2016-01-09 – 2016-01-10 (×2): 10 [IU] via SUBCUTANEOUS
  Filled 2016-01-09 (×3): qty 0.1

## 2016-01-09 MED ORDER — FUROSEMIDE 10 MG/ML IJ SOLN
20.0000 mg | Freq: Once | INTRAMUSCULAR | Status: AC
  Administered 2016-01-09: 20 mg via INTRAVENOUS
  Filled 2016-01-09: qty 2

## 2016-01-09 MED ORDER — SODIUM CHLORIDE 0.9% FLUSH: 10.0000 mL | INTRAVENOUS | Status: DC | PRN

## 2016-01-09 MED ORDER — ACETAMINOPHEN 160 MG/5ML PO SOLN
650.0000 mg | Freq: Four times a day (QID) | ORAL | Status: DC | PRN
  Administered 2016-01-09 – 2016-01-11 (×4): 650 mg via ORAL
  Filled 2016-01-09 (×4): qty 20.3

## 2016-01-09 NOTE — Interval H&P Note (Signed)
Elizabeth BuryMary J Uhde was admitted today to Inpatient Rehabilitation with the diagnosis of debility and encephalopathy.  The patient's history has been reviewed, patient examined, and there is no change in status.  Patient continues to be appropriate for intensive inpatient rehabilitation.  I have reviewed the patient's chart and labs.  Questions were answered to the patient's satisfaction. The PAPE has been reviewed and assessment remains appropriate.  Windy Dudek T 01/09/2016, 4:33 PM

## 2016-01-09 NOTE — Discharge Summary (Addendum)
Physician Discharge Summary  [name]:[id number] DOB: 09/12/1961 DOA: 12/22/2015  PCP: [name], MD  Admit date: 12/22/2015 Discharge date: 01/09/2016  Recommendations for Outpatient Follow-up:  1. Pt will need to follow up with PCP in 1-2 weeks post discharge 2. Please obtain BMP to evaluate electrolytes and kidney function 3. Please also check CBC to evaluate Hg and Hct levels 4. Please note that pt should be on Prednisone 5 mg PO QD for three more days, until 01/12/2016 5. Also, please note that weight on discharge was 228 lbs, which is above pt's baseline weight ~ 215. Beds have been changed so not sure if weight has been accurate. Pt was discharged on Lasix 40 mg BID, monitor daily weights and if weight close to her baseline, please lower the frequency of Lasix form BID to QD 6. Please note that dose of Lantus was lowered from 50 U to 10 U due to hypoglycemic events, her Amaryl was held for the same reason. It is OK to readjust the insulin regimen as clinically indicated   Discharge Diagnoses:  Principal Problem:   Acute on chronic diastolic CHF (congestive heart failure), NYHA class 1 (HCC) Active Problems:   Diabetes mellitus, insulin dependent (IDDM), uncontrolled (HCC)   Depression   HTN (hypertension)   COPD (chronic obstructive pulmonary disease) (HCC)   Moderate mitral stenosis   Hypothyroidism   Bipolar I disorder (HCC)   Agoraphobia   DM type 2, uncontrolled, with renal complications (HCC)   Anemia of chronic disease   Cellulitis of leg, left   Chronic respiratory failure (HCC)   Acute on chronic respiratory failure (HCC)   COPD exacerbation (HCC)   Acute on chronic congestive heart failure (HCC)   Bilateral edema of lower extremity   Acute respiratory failure with hypoxemia (HCC)   Hypotension due to drugs   HCAP (healthcare-associated pneumonia)   Acute encephalopathy   ARDS (adult respiratory distress syndrome) (HCC)  Discharge Condition:  Stable  Diet recommendation: Heart healthy diet discussed in details, dys III diet   Brief Narrative:  55 year old woman admitted on 03/31 with acute HF exacerbation 2/2 non-compliance with diuretic therapy admitted to ICU on 04/03 for acute hypoxic respiratory failure due to ARDS due to aspiration.   SIGNIFICANT EVENTS: 3/31 Admit 4/03 VDRF >> to ICU, ARDS protocol, start nimbex 4/05 d/c nimbex 4/10 to precedex, depakote added 4/14 [facility] assumed care from [facility] 4/16 more agitation of unclear etiology, confusion, ? Delirium  4/18 transfer to inpatient rehab   Assessment & Plan: Acute hypoxic respiratory failure, VDRF - ARDS 2/2 aspiration, AECOPD - Continue BD's scheduled and as needed  - CXR 4/15 noting improvement in vascular congestion but 4/17 with slight increase - pt with no wheezing this AM  - continue antitussives - pt maintaining oxygen saturation at target range  - started lasix 40 mg BID PO, pt takes 40 mg PO at home QD with extra dose of Lasix as needed - for now OK to continue Lasix 40 mg BID PO and once weight close to baseline, resume 40 mg PO QD  Acute on chronic diastolic HF  - weight was 226 lbs on admission --> 220 --> 215 --> 212 --> 228 lbs this AM, nurse said beds were changed last night and not sure if there is an error related to this, will recheck again  - continue to monitor daily weights and readjust the dose of Lasix as noted above   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 - maintaining oxygen saturation at target range   HTN, essential - reasonably stable this AM - continue metoprolol and resume Losartan per home medical regimen   Acute kidney injury  - resolved, Cr has remained WNL in the past 72 hours   Hypernatremia - resolved  Hypokalemia - resolved   Loose stools - resolved   Leukocytosis 2/2 to aspiration PNA and steroids - completed course of ABX - CXR 4/15 with no signs of PNA - WBC trending down and WNL this AM   Anemia of  critical illness in the setting of AoCD  - Hg overall stable, no signs of bleeding   DM type 2 on long term insulin and complications of neuropathy  - lowered the dose of lantus to 10 U due to hypoglycemic events   Hypothyroidism  - Continue Levothyroxine, repeat TSH 6.475  Acute encephalopathy - secondary to hypoglycemic events and acute illness - resolve d  DVT prophylaxis: Lovenox SQ  Code Status: Full Family Communication: Patient at bedside  Disposition Plan: Inpatient rehab   Consultants:   IR  Procedures:   4/04 Echo >> EF 60 to 65%, grade 2 diastolic CHF, severe MS  4/10 CT head > Stable diffuse cortical atrophy. Mild chronic ischemic white matter disease. No acute intracranial abnormality seen.  4/10 EEG > abnormal with diffuse slowing c/w several metabolic abcormalitites  Antimicrobials:   CULTURES: 4/03 Sputum >> rare yeast c/w candida 4/04 Influenza PCR >> negative 4/04 Respiratory viral panel >> negative   ANTIBIOTICS: 4/01 Doxycycline >> 4/03 4/03 Vancomycin >> 04/06 4/03 Zosyn >> 04/10    Discharge Exam: Filed Vitals:   01/09/16 0700 01/09/16 0803  BP:  135/66  Pulse: 63 59  Temp:  98.1 F (36.7 C)  Resp: 17 20   Filed Vitals:   01/09/16 0600 01/09/16 0700 01/09/16 0803 01/09/16 0842  BP: 144/66  135/66   Pulse: 65 63 59   Temp:   98.1 F (36.7 C)   TempSrc:   Oral   Resp: Height:      Weight: 103.42 kg (228 lb)     SpO2: 98% 100% 100% 100%    General: Pt is alert, follows commands appropriately, not in acute distress Cardiovascular: Regular rate and rhythm, no rubs, no gallops Respiratory: Clear to auscultation bilaterally, mild crackles at bases  Abdominal: Soft, non tender, non distended, bowel sounds +, no guarding Extremities: trace bilateral LE edema, no cyanosis, pulses palpable bilaterally DP and PT Neuro: Grossly nonfocal  Discharge Instructions  Discharge Instructions    Diet - low sodium heart  healthy    Complete by:  As directed      Increase activity slowly    Complete by:  As directed             Medication List    STOP taking these medications        gabapentin 100 MG capsule  Commonly known as:  NEURONTIN     glimepiride 4 MG tablet  Commonly known as:  AMARYL     HYDROcodone-acetaminophen 10-325 MG tablet  Commonly known as:  NORCO     triamcinolone ointment 0.1 %  Commonly known as:  KENALOG      TAKE these medications        acetaminophen 500 MG tablet  Commonly known as:  TYLENOL  Take 500 mg by mouth daily.     ALPRAZolam 0.5 MG tablet  Commonly known as:  Prudy Feeler  Take 1 tablet (0.5 mg total) by mouth at bedtime as needed for anxiety.     ARIPiprazole 5 MG tablet  Commonly known as:  ABILIFY  Take 5 mg by mouth daily.     aspirin EC 81 MG tablet  Take 1 tablet (81 mg total) by mouth daily.     cetirizine 10 MG tablet  Commonly known as:  ZYRTEC  Take 10 mg by mouth daily.     divalproex 250 MG DR tablet  Commonly known as:  DEPAKOTE  Take 1 tablet (250 mg total) by mouth every 12 (twelve) hours.     furosemide 40 MG tablet  Commonly known as:  LASIX  Take 1 tablet (40 mg total) by mouth 2 (two) times daily.     guaiFENesin 600 MG 12 hr tablet  Commonly known as:  MUCINEX  Take 1 tablet (600 mg total) by mouth 2 (two) times daily.     guaiFENesin-dextromethorphan 100-10 MG/5ML syrup  Commonly known as:  ROBITUSSIN DM  Take 5 mLs by mouth every 4 (four) hours as needed for cough.     insulin glargine 100 UNIT/ML injection  Commonly known as:  LANTUS  Inject 0.1 mLs (10 Units total) into the skin daily.     ipratropium-albuterol 0.5-2.5 (3) MG/3ML Soln  Commonly known as:  DUONEB  Take 3 mLs by nebulization every 2 (two) hours as needed.     ipratropium-albuterol 0.5-2.5 (3) MG/3ML Soln  Commonly known as:  DUONEB  Take 3 mLs by nebulization every 6 (six) hours.     levothyroxine 125 MCG tablet  Commonly known as:  SYNTHROID,  LEVOTHROID  Take 1 tablet (125 mcg total) by mouth daily before breakfast.     losartan 100 MG tablet  Commonly known as:  COZAAR  Take 1 tablet (100 mg total) by mouth daily.     metoprolol tartrate 25 MG tablet  Commonly known as:  LOPRESSOR  Take 0.5 tablets (12.5 mg total) by mouth 2 (two) times daily.     NOVOLOG FLEXPEN 100 UNIT/ML FlexPen  Generic drug:  insulin aspart  Inject 6-15 Units into the skin 3 (three) times daily as needed for high blood sugar (CBG >100). CBG 100-200 6-8 units, >200 15 units     omeprazole 20 MG capsule  Commonly known as:  PRILOSEC  Take 20 mg by mouth daily.     OXYGEN  Inhale into the lungs continuous. 3L     potassium chloride SA 20 MEQ tablet  Commonly known as:  K-DUR,KLOR-CON  Take 1 tablet (20 mEq total) by mouth daily.     predniSONE 5 MG tablet  Commonly known as:  DELTASONE  Continue Prednisone 5 mg tablet for three more days, until 4/21 and then stop     PROAIR HFA 108 (90 Base) MCG/ACT inhaler  Generic drug:  albuterol  Inhale 2 puffs into the lungs every 6 (six) hours. scheduled     simvastatin 10 MG tablet  Commonly known as:  ZOCOR  Take 10 mg by mouth daily.           Follow-up Information    Follow up with Dorrene GermanEdwin A Avbuere, MD.   Specialty:  Internal Medicine   Contact information:   [address] [place] [address] [phone]        The results of significant diagnostics from this hospitalization (including imaging, microbiology, ancillary and laboratory) are listed below for reference.     Microbiology: No results found for  this or any previous visit (from the past 240 hour(s)).   Labs: Basic Metabolic Panel:  Recent Labs Lab 01/03/16 0455 01/04/16 0408 01/05/16 0429 01/06/16 0400 01/07/16 0348 01/08/16 0415 01/09/16 0903  NA 147* 147* 149* 141 141 145 144  K 2.8* 3.1* 3.4* 3.2* 4.2 3.9 4.1  CL 99* 99* 99* 93* 94* 100* 100*  CO2 36* 39* 40* 39* 39* 40* 36*  GLUCOSE 152* 121* 130*  117* 78 77 194*  BUN 30* 29* 34* 24* CREATININE 0.62 0.64 0.69 0.60 0.63 0.68 0.72  CALCIUM 9.2 9.0 9.3 9.3 9.2 8.8* 9.2  MG 1.8 2.0 2.0  --   --   --   --   PHOS 2.9 4.0 3.7  --   --   --   --    Liver Function Tests: No results for input(s): AST, ALT, ALKPHOS, BILITOT, PROT, ALBUMIN in the last 168 hours. No results for input(s): LIPASE, AMYLASE in the last 168 hours. No results for input(s): AMMONIA in the last 168 hours. CBC:  Recent Labs Lab 01/05/16 0429 01/06/16 0400 01/07/16 0348 01/08/16 0415 01/09/16 0903  WBC 12.4* 12.3* 11.9* 9.1 7.8  HGB 8.5* 8.3* 8.9* 8.4* 8.8*  HCT 30.6* 29.0* 30.8* 28.0* 31.0*  MCV 83.6 82.4 81.9 82.8 83.3  PLT 251 206 301 219 225    BNP (last 3 results)  Recent Labs  07/29/15 1345 12/22/15 2305 12/25/15 1158  BNP 333.2* 348.3* 477.5*   CBG:  Recent Labs Lab 01/08/16 1211 01/08/16 1631 01/08/16 2131 01/09/16 0605 01/09/16 0805  GLUCAP 132* 251* 218* 203* 169*    SIGNED: Time coordinating discharge:  30 minutes  MAGICK-Anjela Cassara, MD  [facility] 01/09/2016, 10:42 AM Pager [phone]  If 7PM-7AM, please contact night-coverage [email or web address] Password [email or web address]

## 2016-01-09 NOTE — Progress Notes (Addendum)
Nutrition Follow-up  DOCUMENTATION CODES:   Morbid obesity  INTERVENTION:   -Glucerna Shake po BID, each supplement provides 220 kcal and 10 grams of protein  NUTRITION DIAGNOSIS:   Inadequate oral intake related to lethargy/confusion as evidenced by meal completion < 50%.  Ongoing  GOAL:   Patient will meet greater than or equal to 90% of their needs  Progressing  MONITOR:   PO intake, Supplement acceptance, Labs, Diet advancement, Weight trends, Skin, I & O's  REASON FOR ASSESSMENT:   Consult Enteral/tube feeding initiation and management  ASSESSMENT:   55 yo female presented with dyspnea and b/l leg pain/swelling after stopping outpt lasix. She was being tx for CHF and COPD exacerbations. She developed worsening hypoxia with b/l pulmonary infiltrates 4/03 after having episode of emesis with concern for aspiration and ARDS.  SLP following; pt was advanced to a dysphagia 3 diet with thin liquids on 01/05/16. TF were subsequently d/c.   Pt was lying in bed, somnolent, at time of visit. Pt would not arouse for this RD when name was called. Reviewed SLP note from this AM; pt generally lethargic in the AM. Observed breakfast tray at bedside. Pt consumed 100% of eggs and sausage, two cups of orange juice and 50% of applesauce. Meal completion variable per doc flowsheet records (PO: 25-100%).   Therapy and CIR following; recommending CIR admission, but continuing to monitor for progress (per CIR admissions, pt must tolerate being up in a chair for at least one hour).   Labs reviewed: CBGS: 169-251.   Diet Order:  DIET DYS 3 Room service appropriate?: Yes; Fluid consistency:: Thin  Skin:  Wound (see comment) (MASD bilateral breast, DM ulcers on rt and lt legs)  Last BM:  01/05/16  Height:   Ht Readings from Last 1 Encounters:  12/25/15 5' (1.524 m)    Weight:   Wt Readings from Last 1 Encounters:  01/09/16 228 lb (103.42 kg)    Ideal Body Weight:  45.4  kg  BMI:  Body mass index is 44.53 kg/(m^2).  Estimated Nutritional Needs:   Kcal:  1850-2050  Protein:  90-115 grams  Fluid:  1.8-2.0 L  EDUCATION NEEDS:   No education needs identified at this time  Merelin Human A. Mayford KnifeWilliams, RD, LDN, CDE Pager: [phone] After hours Pager: [phone]

## 2016-01-09 NOTE — Progress Notes (Signed)
Rehab admissions - I spoke with patient's daughter.  Daughter feels patient may have to go to a SNF after inpatient rehab stay.  Daughter is fine with inpatient rehab admission.  Bed available and will admit to acute inpatient rehab today.  Call me for questions.  #[phone]
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
## 2016-01-09 NOTE — Progress Notes (Signed)
Speech Language Pathology Treatment: Dysphagia  Patient Details Name: [name] MRN: [id number] DOB: 1961-05-31 Today's Date: 01/09/2016 Time: [id number] SLP Time Calculation (min) (ACUTE ONLY): 8 min  Assessment / Plan / Recommendation Clinical Impression  Pt asleep upon SLP arrival and will awaken for self-fed trials of thin liquids but remains drowsy. RN reports increased lethargy most mornings secondary to medication effects, but with improved alertness as the day continues. No overt s/s of aspiration observed with thin liquids via straw, although deep inhalation noted post-swallow x1. She does remain afebrile though and CXR 4/17 does not show airspace consolidation. SLP offered advanced trials of regular textures, although pt politely declined all solid POs at this time. Will continue to follow for possible advancement when more alert.   HPI HPI: 55 year old woman admitted on 03/31 with acute HF exacerbation 2/2 non-compliance with diuretic therapy. The patient was doing fairly well with diuresis until 12/25/15 when she developed significant respiratory distress. Her O2 sats were in the 70s on 100% NRB and high flow [place]. She was intolerant of BiPAP and the critical care team was subsequently consulted for intubation.  She vomited large volume of emesis during intubation and subsequently had what appeared to be significant aspiration event. Pt intubated from 4/3-4/12. Pt was seen for BSE on 4/13 and made NPO due to signs of acute reversible dysphagia following intubation. On 4/14 pt showed improvement and was started on dys 3/thin diet and SLP signed off. Then on 4/16 pt observed to have difficutly with meal, coughing and gurgling. SLP reordered. CXR on 4/15 shows no new acute process.       SLP Plan  Continue with current plan of care     Recommendations  Diet recommendations: Dysphagia 3 (mechanical soft);Thin liquid Liquids provided via: Cup;Straw Medication Administration: Whole meds  with puree Supervision: Staff to assist with self feeding Compensations: Minimize environmental distractions;Slow rate;Small sips/bites;Follow solids with liquid Postural Changes and/or Swallow Maneuvers: Seated upright 90 degrees;Upright 30-60 min after meal             Oral Care Recommendations: Oral care BID Follow up Recommendations: Inpatient Rehab Plan: Continue with current plan of care     GO               Maxcine HamLaura Paiewonsky, M.A. CCC-SLP [phone]  Maxcine Hamaiewonsky, Matalie Romberger 01/09/2016, 9:52 AM

## 2016-01-09 NOTE — Progress Notes (Signed)
Rt spoke with Arnell AsalPamela love,PA. PA will D/C repeat ABG. ABG not needed at this time.

## 2016-01-09 NOTE — Progress Notes (Signed)
Pt c/o shortness of breath at 1720, ABG results called to nurse at 1730 for CO2 62.5 and Bicarb 37.1. PA on unit and notified of critical values. Pt RR increased and O2 sats 98-100% on O2 at 3 L/m. Advised and orders received by PA and RT to lower O2 level to 1-2 L/min. PA at the patient bedside at while the patient was in distress. Encouraged patient to take deep breaths and breath some breaths into a bag. Patient began to calm down and less anxious. Cont to monitor patient.  Treyshon Buchanon, Phill MutterMelissa Rebecca

## 2016-01-09 NOTE — Progress Notes (Addendum)
Patient with hypercarbia --nurse in room and patient panicking due to oxygen being decreased to 2 liters and reporting SOB. Encouraged patient to relax and to breathe slowly.  Patient with compensated Hypercarbic respiratory failure--should improve with decrease in oxygen (was on 4 liters downstairs). RT recommended --recheck ABG in am rather than later today as patient hard stick and results consistent with prior checks.

## 2016-01-09 NOTE — PMR Pre-admission (Signed)
PMR Admission Coordinator Pre-Admission Assessment  Patient: [name] is an 55 y.o., female MRN: [id number] DOB: 12-17-1960 Height: 5' (152.4 cm) Weight: 103.42 kg (228 lb)              Insurance Information HMO:     PPO:       PCP:       IPA:       80/20:       OTHER:   PRIMARY: Medicaid [place] access      Policy#: [id number]      Subscriber: [name] CM Name:        Phone#:       Fax#:   Pre-Cert#:        Employer: Not employed Benefits:  Phone #: [phone]     Name: Automated Eff. Date: Eligible 01/09/16 with coverage code [id number]     Deduct:        Out of Pocket Max:        Life Max:   CIR:        SNF:   Outpatient:       Co-Pay:   Home Health:        Co-Pay:   DME:       Co-Pay:   Providers  Emergency Contact Information Contact Information    Name Relation Home Work Mobile   [place] Significant other [phone]     Theone Murdoch Daughter [phone]     Salley Scarlet  [phone]       Current Medical History  Patient Admitting Diagnosis: Debility/encephalopathy after pneumonia/respiratory failure   History of Present Illness: A 55 y.o. 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 She was started on IV diuresis for acute on chronic CHF and developed ARDS requiring intubation from 04/01. She was started on IV antibiotics for likely aspiration PNA due to vomiting during intubation. She was placed on heavy sedation and hospital course significant for metabolic encephalopathy. Neurology felt encephalopathy was to be due to sedation as well as hypothyroid state. She tolerated vent wean and mentation has improved with extubation on 04/12. Swallow evaluation post extubation with signs of acute reversible dysphagia and patient NPO.  Therapy evaluations done and patient with significant dizziness with attempts at bed mobility. CIR recommended by rehab team.     Past Medical History  Past Medical History  Diagnosis Date  . Hypertension   . Anemia   . Chronic back pain     "all over"  . Neuropathy (HCC)   . Hypercholesterolemia   . Major depressive disorder, recurrent (HCC)   . Generalized anxiety disorder   . Panic disorder with agoraphobia   . Tobacco abuse   . MI (myocardial infarction) (HCC) 2007    a. Per patient report she had a heart attack in 2007. Our consult note from 11/2006 indicates the patient had been seen in 07/2006 by her PCP and was told based on an EKG that she may have had a prior MI. She had undergone a low risk stress test at that time.  . Type II diabetes mellitus (HCC)   . Heart murmur   . Emphysema of lung (HCC)   . Pneumonia 1960's X 2  . On home oxygen therapy     "2L prn" (04/12/2015)  . Thin blood (HCC)   .  History of blood transfusion     "related to OR"  . GERD (gastroesophageal reflux disease)   . Arthritis     "hands, left knee, left shoulder, back feet" (04/12/2015)  . Bipolar disorder (HCC)   . Rheumatic fever   . Mitral stenosis   . COPD (chronic obstructive pulmonary disease) (HCC)   . Hypothyroidism   . Chronic back pain   . PONV (postoperative nausea and vomiting)   . Seizures (HCC)     at age 89  . CHF (congestive heart failure) (HCC)   . Fracture of right humerus 04/13/2015  . Left patella fracture 04/12/2015  . Shoulder fracture, right 04/12/2015  . Shortness of breath dyspnea     Family History  family history includes Heart attack in her mother; Heart disease in her mother; Hypertension in her mother; Lung cancer in her father; Stroke in her mother.  Prior Rehab/Hospitalizations: [facility] to 02/17 after a back fracture.  Has the patient had major surgery during 100 days prior to admission? Yes.  Had left knee surgery in November,  2016.  Current Medications   Current facility-administered medications:  .  acetaminophen (TYLENOL) solution 650 mg, 650 mg, Oral, Q6H PRN, Coralyn HellingVineet Sood, MD, 650 mg at 01/06/16 2308 .  ALPRAZolam Prudy Feeler(XANAX) tablet 1 mg, 1 mg, Oral, TID PRN, Dorothea OgleIskra M Myers, MD, 1 mg at 01/08/16 2139 .  antiseptic oral rinse (CPC / CETYLPYRIDINIUM CHLORIDE 0.05%) solution 7 mL, 7 mL, Mouth Rinse, BID, Dorothea OgleIskra M Myers, MD, 7 mL at 01/09/16 1000 .  ARIPiprazole (ABILIFY) tablet 5 mg, 5 mg, Oral, Daily, Dorothea OgleIskra M Myers, MD, 5 mg at 01/09/16 0900 .  aspirin chewable tablet 81 mg, 81 mg, Oral, Daily, Dorothea OgleIskra M Myers, MD, 81 mg at 01/09/16 0900 .  divalproex (DEPAKOTE) DR tablet 250 mg, 250 mg, Oral, Q12H, Dorothea OgleIskra M Myers, MD, 250 mg at 01/09/16 0900 .  enoxaparin (LOVENOX) injection 40 mg, 40 mg, Subcutaneous, Daily, Alberteen Samhristopher P Danford, MD, 40 mg at 01/09/16 0900 .  feeding supplement (GLUCERNA SHAKE) (GLUCERNA SHAKE) liquid 237 mL, 237 mL, Oral, BID BM, Dorothea OgleIskra M Myers, MD, 237 mL at 01/09/16 1045 .  fentaNYL (SUBLIMAZE) injection 100 mcg, 100 mcg, Intravenous, Q2H PRN, Simonne MartinetPeter E Babcock, NP, 100 mcg at 01/03/16 0612 .  [START ON 01/10/2016] furosemide (LASIX) tablet 40 mg, 40 mg, Oral, Daily, Dorothea OgleIskra M Myers, MD .  guaiFENesin [facility](MUCINEX) 12 hr tablet 600 mg, 600 mg, Oral, BID, Dorothea OgleIskra M Myers, MD, 600 mg at 01/09/16 0900 .  guaiFENesin-dextromethorphan (ROBITUSSIN DM) 100-10 MG/5ML syrup 5 mL, 5 mL, Oral, Q4H PRN, Dorothea OgleIskra M Myers, MD, 5 mL at 01/06/16 0952 .  insulin aspart (novoLOG) injection 0-9 Units, 0-9 Units, Subcutaneous, TID WC, Dorothea OgleIskra M Myers, MD, 2 Units at 01/09/16 1156 .  insulin glargine (LANTUS) injection 10 Units, 10 Units, Subcutaneous, QHS, Dorothea OgleIskra M Myers, MD, 10 Units at 01/08/16 2140 .  ipratropium-albuterol (DUONEB) 0.5-2.5 (3) MG/3ML nebulizer solution 3 mL, 3 mL, Nebulization, Q2H PRN, Dorothea OgleIskra M Myers, MD .  ipratropium-albuterol (DUONEB) 0.5-2.5 (3) MG/3ML nebulizer solution 3 mL, 3 mL, Nebulization, Q6H, Dorothea OgleIskra M Myers,  MD, 3 mL at 01/09/16 0840 .  levothyroxine (SYNTHROID, LEVOTHROID) tablet 125 mcg, 125 mcg, Oral, QAC breakfast, Dorothea OgleIskra M Myers, MD, 125 mcg at 01/09/16 0612 .  metoprolol tartrate (LOPRESSOR) tablet 12.5 mg, 12.5 mg, Oral, BID, Dorothea OgleIskra M Myers, MD, 12.5 mg at 01/09/16 0900 .  multivitamin with minerals tablet 1 tablet, 1  tablet, Oral, Daily, Dorothea Ogle, MD, 1 tablet at 01/09/16 0900 .  [DISCONTINUED] ondansetron (ZOFRAN) tablet 4 mg, 4 mg, Oral, Q6H PRN **OR** ondansetron (ZOFRAN) injection 4 mg, 4 mg, Intravenous, Q6H PRN, Alberteen Sam, MD, 4 mg at 01/07/16 0141 .  pantoprazole (PROTONIX) EC tablet 40 mg, 40 mg, Oral, Q breakfast, Dorothea Ogle, MD, 40 mg at 01/09/16 0900 .  predniSONE (DELTASONE) tablet 10 mg, 10 mg, Oral, Q breakfast, Dorothea Ogle, MD, 10 mg at 01/09/16 8469 .  simvastatin (ZOCOR) tablet 10 mg, 10 mg, Oral, q1800, Dorothea Ogle, MD, 10 mg at 01/08/16 1723 .  sodium chloride flush (NS) 0.9 % injection 10-40 mL, 10-40 mL, Intracatheter, Q12H, Praveen Mannam, MD, 10 mL at 01/09/16 0900 .  sodium chloride flush (NS) 0.9 % injection 10-40 mL, 10-40 mL, Intracatheter, PRN, Chilton Greathouse, MD  Patients Current Diet: DIET DYS 3 Room service appropriate?: Yes; Fluid consistency:: Thin Diet - low sodium heart healthy  Precautions / Restrictions Precautions Precautions: Fall Precaution Comments: Ng tube, rectal pouch, foley Other Brace/Splint: bil orthopedic shoes in closet Restrictions Weight Bearing Restrictions: No   Has the patient had 2 or more falls or a fall with injury in the past year?Yes.  Frequent falls at home as many as 6 a month.  Suffered a back fracture last year.  Prior Activity Level Household: Homebound.  Went out about 2 times a month for doctor appointments.  Home Assistive Devices / Equipment Home Assistive Devices/Equipment: Wheelchair, Bedside commode/3-in-1 Home Equipment: Environmental consultant - 2 wheels, [place] - single point, [employer], Grab bars -  tub/shower, Hand held shower head, Wheelchair - manual, Other (comment), Bedside commode (Hemiwalker)  Prior Device Use: Indicate devices/aids used by the patient prior to current illness, exacerbation or injury? Manual wheelchair and Walker  Prior Functional Level Prior Function Level of Independence: Needs assistance Gait / Transfers Assistance Needed: Pt now reports she used hemiwalker to stand-pivot to w/c and then used w/c to get around house.  ADL's / Homemaking Assistance Needed: Has a home health aide that assists with bathing and dressing, laundry, meals 3x per week for 3-4 hours.  Significant other does meal prep, housekeeping, etc. Comments: used tub transfer bench for showering  Self Care: Did the patient need help bathing, dressing, using the toilet or eating?  Needed some help  Indoor Mobility: Did the patient need assistance with walking from room to room (with or without device)? Needed some help  Stairs: Did the patient need assistance with internal or external stairs (with or without device)? Needed some help  Functional Cognition: Did the patient need help planning regular tasks such as shopping or remembering to take medications? Needed some help  Current Functional Level Cognition  Overall Cognitive Status: No family/caregiver present to determine baseline cognitive functioning (reports she has been w/c level at home (?accuracy)) Orientation Level: Oriented to person, Oriented to place, Oriented to time, Oriented to situation    Extremity Assessment (includes Sensation/Coordination)  Upper Extremity Assessment: Defer to OT evaluation RUE Deficits / Details: Decreased ROM and strength at shoulder and hand  Lower Extremity Assessment: Generalized weakness RLE Deficits / Details: Noted increased edema and wounds.  Decreased strength. RLE Sensation: history of peripheral neuropathy RLE Coordination: decreased fine motor, decreased gross motor LLE Deficits / Details:  decreased strength noted, increased bilateral LE edema LLE Sensation: history of peripheral neuropathy LLE Coordination: decreased fine motor, decreased gross motor    ADLs  Mobility  Overal bed mobility: Needs Assistance, +2 for physical assistance, + 2 for safety/equipment Bed Mobility: Supine to Sit, Sit to Supine Rolling: Mod assist, +2 for physical assistance Supine to sit: HOB elevated, Mod assist, +2 for physical assistance Sit to supine: Mod assist, +2 for physical assistance General bed mobility comments: patient eager to attempt EOB and transfers; pt able to assist with all aspects of mobility, requires assist due to weakness    Transfers  Overall transfer level: Needs assistance Equipment used: 2 person hand held assist Transfers: Sit to/from Stand, Lateral/Scoot Transfers Sit to Stand: Mod assist, +2 safety/equipment, +2 physical assistance, From elevated surface (ICU bed at lowest height)  Lateral/Scoot Transfers: Mod assist, +2 safety/equipment, +2 physical assistance General transfer comment: unable to fully stand; able to assist with lateral scoot/side step    Ambulation / Gait / Stairs / Wheelchair Mobility  Ambulation/Gait Ambulation/Gait assistance: Min guard Ambulation Distance (Feet): 24 Feet Assistive device: Rolling walker (2 wheeled) Gait Pattern/deviations: Step-through pattern, Decreased stride length, Shuffle, Trunk flexed General Gait Details: unable Gait velocity: decreased Gait velocity interpretation: Below normal speed for age/gender    Posture / Balance Balance Overall balance assessment: Needs assistance Sitting-balance support: No upper extremity supported, Feet unsupported Sitting balance-Leahy Scale: Fair Standing balance support: Bilateral upper extremity supported Standing balance-Leahy Scale: Poor    Special needs/care consideration BiPAP/CPAP No CPM No Continuous Drip IV 0.9% NS 75 mL/hr Dialysis No        Life Vest No Oxygen  Yes, uses 02 [place] at home Special Bed No Trach Size No Wound Vac (area) No      Skin Has wounds on legs                             Bowel mgmt: Last BM 01/05/16 Bladder mgmt: Incontinent at times Diabetic mgmt Yes, on insulin at home    Previous Home Environment Living Arrangements: Spouse/significant other Available Help at Discharge: Family, Available 24 hours/day Type of Home: Apartment Home Layout: One level Home Access: Level entry Bathroom Shower/Tub: Associate Professor: Yes Home Care Services: Yes Type of Home Care Services: Homehealth aide Home Care Agency (if known): does not remember name of agency  Discharge Living Setting Plans for Discharge Living Setting: Lives with (comment), Apartment (Lives with ex-husband in an apartment.) Type of Home at Discharge: Apartment Discharge Home Layout: One level Discharge Home Access: Level entry Does the patient have any problems obtaining your medications?: No  Social/Family/Support Systems Patient Roles: Spouse, Parent (Has an ex-husband, daughter.) Contact Information: [place] Silos - ex-husband/significant other [phone] Anticipated Caregiver: Ex-husband, daughter Anticipated Caregiver's Contact Information: Theone Murdoch - daughter [phone] Ability/Limitations of Caregiver: Daughter would like patient placed in a nursing home after rehab.  Patient does not want to go to a nursing home.  Patient and ex-husband concerned about loss of monthly check if patient goes to a nursing home. Caregiver Availability: 24/7 Discharge Plan Discussed with Primary Caregiver: Yes Is Caregiver In Agreement with Plan?: Yes Does Caregiver/Family have Issues with Lodging/Transportation while Pt is in Rehab?: No  Goals/Additional Needs Patient/Family Goal for Rehab: PT/OT supervision, ST mod I goals Expected length of stay: 11-16 days Cultural Considerations: None Dietary Needs: Dys 3, thin  liquids diet Equipment Needs: TBD Additional Information: I spoke with patient's daughter who is trying to get patient into a nursing home through the court system.  Daughter is patient's payee, but  not POA.   Pt/Family Agrees to Admission and willing to participate: Yes Program Orientation Provided & Reviewed with Pt/Caregiver Including Roles  & Responsibilities: Yes  Decrease burden of Care through IP rehab admission: N/A  Possible need for SNF placement upon discharge:Yes, daughter prefers placement rather than home with patient's ex-husband  Patient Condition: This patient's medical and functional status has changed since the consult dated: 01/05/16 in which the Rehabilitation Physician determined and documented that the patient's condition is appropriate for intensive rehabilitative care in an inpatient rehabilitation facility. See "History of Present Illness" (above) for medical update. Functional changes are: Currently requiring mod assist for lateral scoot transfer, unable to stand on 01/08/16. Patient's medical and functional status update has been discussed with the Rehabilitation physician and patient remains appropriate for inpatient rehabilitation. Will admit to inpatient rehab today.  Preadmission Screen Completed By:  Trish Mage, 01/09/2016 12:06 PM ______________________________________________________________________   Discussed status with Dr. Riley Kill on 01/09/16 at 1202 and received telephone approval for admission today.  Admission Coordinator:  Trish Mage, time1202/Date04/18/17

## 2016-01-09 NOTE — Progress Notes (Signed)
Pt discharged to inpatient inpatient rehab.  Emotional support provided. Pt's husband called and notified of transfer.

## 2016-01-09 NOTE — Discharge Instructions (Signed)
Chronic Obstructive Pulmonary Disease Chronic obstructive pulmonary disease (COPD) is a common lung condition in which airflow from the lungs is limited. COPD is a general term that can be used to describe many different lung problems that limit airflow, including both chronic bronchitis and emphysema. If you have COPD, your lung function will probably never return to normal, but there are measures you can take to improve lung function and make yourself feel better. CAUSES   Smoking (common).  Exposure to secondhand smoke.  Genetic problems.  Chronic inflammatory lung diseases or recurrent infections. SYMPTOMS  Shortness of breath, especially with physical activity.  Deep, persistent (chronic) cough with a large amount of thick mucus.  Wheezing.  Rapid breaths (tachypnea).  Gray or bluish discoloration (cyanosis) of the skin, especially in your fingers, toes, or lips.  Fatigue.  Weight loss.  Frequent infections or episodes when breathing symptoms become much worse (exacerbations).  Chest tightness. DIAGNOSIS Your health care provider will take a medical history and perform a physical examination to diagnose COPD. Additional tests for COPD may include:  Lung (pulmonary) function tests.  Chest X-ray.  CT scan.  Blood tests. TREATMENT  Treatment for COPD may include:  Inhaler and nebulizer medicines. These help manage the symptoms of COPD and make your breathing more comfortable.  Supplemental oxygen. Supplemental oxygen is only helpful if you have a low oxygen level in your blood.  Exercise and physical activity. These are beneficial for nearly all people with COPD.  Lung surgery or transplant.  Nutrition therapy to gain weight, if you are underweight.  Pulmonary rehabilitation. This may involve working with a team of health care providers and specialists, such as respiratory, occupational, and physical therapists. HOME CARE INSTRUCTIONS  Take all medicines  (inhaled or pills) as directed by your health care provider.  Avoid over-the-counter medicines or cough syrups that dry up your airway (such as antihistamines) and slow down the elimination of secretions unless instructed otherwise by your health care provider.  If you are a smoker, the most important thing that you can do is stop smoking. Continuing to smoke will cause further lung damage and breathing trouble. Ask your health care provider for help with quitting smoking. He or she can direct you to community resources or hospitals that provide support.  Avoid exposure to irritants such as smoke, chemicals, and fumes that aggravate your breathing.  Use oxygen therapy and pulmonary rehabilitation if directed by your health care provider. If you require home oxygen therapy, ask your health care provider whether you should purchase a pulse oximeter to measure your oxygen level at home.  Avoid contact with individuals who have a contagious illness.  Avoid extreme temperature and humidity changes.  Eat healthy foods. Eating smaller, more frequent meals and resting before meals may help you maintain your strength.  Stay active, but balance activity with periods of rest. Exercise and physical activity will help you maintain your ability to do things you want to do.  Preventing infection and hospitalization is very important when you have COPD. Make sure to receive all the vaccines your health care provider recommends, especially the pneumococcal and influenza vaccines. Ask your health care provider whether you need a pneumonia vaccine.  Learn and use relaxation techniques to manage stress.  Learn and use controlled breathing techniques as directed by your health care provider. Controlled breathing techniques include:  Pursed lip breathing. Start by breathing in (inhaling) through your nose for 1 second. Then, purse your lips as if you were   going to whistle and breathe out (exhale) through the  pursed lips for 2 seconds.  Diaphragmatic breathing. Start by putting one hand on your abdomen just above your waist. Inhale slowly through your nose. The hand on your abdomen should move out. Then purse your lips and exhale slowly. You should be able to feel the hand on your abdomen moving in as you exhale.  Learn and use controlled coughing to clear mucus from your lungs. Controlled coughing is a series of short, progressive coughs. The steps of controlled coughing are: 1. Lean your head slightly forward. 2. Breathe in deeply using diaphragmatic breathing. 3. Try to hold your breath for 3 seconds. 4. Keep your mouth slightly open while coughing twice. 5. Spit any mucus out into a tissue. 6. Rest and repeat the steps once or twice as needed. SEEK MEDICAL CARE IF:  You are coughing up more mucus than usual.  There is a change in the color or thickness of your mucus.  Your breathing is more labored than usual.  Your breathing is faster than usual. SEEK IMMEDIATE MEDICAL CARE IF:  You have shortness of breath while you are resting.  You have shortness of breath that prevents you from:  Being able to talk.  Performing your usual physical activities.  You have chest pain lasting longer than 5 minutes.  Your skin color is more cyanotic than usual.  You measure low oxygen saturations for longer than 5 minutes with a pulse oximeter. MAKE SURE YOU:  Understand these instructions.  Will watch your condition.  Will get help right away if you are not doing well or get worse.   This information is not intended to replace advice given to you by your health care provider. Make sure you discuss any questions you have with your health care provider.   Document Released: 06/19/2005 Document Revised: 09/30/2014 Document Reviewed: 05/06/2013 [employer] Interactive Patient Education 2016 [employer]  
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
## 2016-01-09 NOTE — Significant Event (Signed)
CRITICAL VALUE ALERT  Critical value received:  CO2 62.5, Bi carb 37.1  Date of notification:  01/09/2016   Time of notification:  1730  Critical value read back:Yes.    Nurse who received alert:  Christin FudgeMelissa Roselee Tayloe, LPN  MD notified (1st page):  Marissa NestlePam Love, PA   Time of first page:  1735  MD notified (2nd page):  Time of second page:  Responding MD:  Marissa NestlePam Love, PA  Time MD responded:  1735, PA on the unit at the time the value was received

## 2016-01-10 ENCOUNTER — Inpatient Hospital Stay (HOSPITAL_COMMUNITY): Payer: Medicaid Other | Admitting: Speech Pathology

## 2016-01-10 ENCOUNTER — Inpatient Hospital Stay (HOSPITAL_COMMUNITY): Payer: Self-pay | Admitting: Occupational Therapy

## 2016-01-10 ENCOUNTER — Inpatient Hospital Stay (HOSPITAL_COMMUNITY): Payer: Medicaid Other | Admitting: Physical Therapy

## 2016-01-10 ENCOUNTER — Ambulatory Visit: Payer: Self-pay | Admitting: Nurse Practitioner

## 2016-01-10 DIAGNOSIS — F3170 Bipolar disorder, currently in remission, most recent episode unspecified: Secondary | ICD-10-CM | POA: Diagnosis present

## 2016-01-10 DIAGNOSIS — F411 Generalized anxiety disorder: Secondary | ICD-10-CM | POA: Diagnosis present

## 2016-01-10 LAB — GLUCOSE, CAPILLARY
GLUCOSE-CAPILLARY: 309 mg/dL — AB (ref 65–99)
Glucose-Capillary: 174 mg/dL — ABNORMAL HIGH (ref 65–99)
Glucose-Capillary: 207 mg/dL — ABNORMAL HIGH (ref 65–99)
Glucose-Capillary: 300 mg/dL — ABNORMAL HIGH (ref 65–99)

## 2016-01-10 LAB — CBC WITH DIFFERENTIAL/PLATELET
Basophils Absolute: 0 10*3/uL (ref 0.0–0.1)
Basophils Relative: 0 %
EOS ABS: 0.3 10*3/uL (ref 0.0–0.7)
Eosinophils Relative: 3 %
HEMATOCRIT: 32.4 % — AB (ref 36.0–46.0)
HEMOGLOBIN: 9.4 g/dL — AB (ref 12.0–15.0)
LYMPHS ABS: 1.1 10*3/uL (ref 0.7–4.0)
Lymphocytes Relative: 11 %
MCH: 24.1 pg — AB (ref 26.0–34.0)
MCHC: 29 g/dL — AB (ref 30.0–36.0)
MCV: 83.1 fL (ref 78.0–100.0)
MONO ABS: 0.4 10*3/uL (ref 0.1–1.0)
MONOS PCT: 4 %
NEUTROS PCT: 83 %
Neutro Abs: 8.8 10*3/uL — ABNORMAL HIGH (ref 1.7–7.7)
Platelets: 248 10*3/uL (ref 150–400)
RBC: 3.9 MIL/uL (ref 3.87–5.11)
RDW: 17.4 % — AB (ref 11.5–15.5)
WBC: 10.6 10*3/uL — ABNORMAL HIGH (ref 4.0–10.5)

## 2016-01-10 LAB — BLOOD GAS, ARTERIAL
ACID-BASE EXCESS: 12.1 mmol/L — AB (ref 0.0–2.0)
BICARBONATE: 37.1 meq/L — AB (ref 20.0–24.0)
Drawn by: 290171
O2 CONTENT: 1 L/min
O2 SAT: 95 %
PATIENT TEMPERATURE: 98.6
PO2 ART: 77.3 mmHg — AB (ref 80.0–100.0)
TCO2: 38.9 mmol/L (ref 0–100)
pCO2 arterial: 57.4 mmHg (ref 35.0–45.0)
pH, Arterial: 7.427 (ref 7.350–7.450)

## 2016-01-10 LAB — COMPREHENSIVE METABOLIC PANEL
ALK PHOS: 81 U/L (ref 38–126)
ALT: 16 U/L (ref 14–54)
ANION GAP: 14 (ref 5–15)
AST: 15 U/L (ref 15–41)
Albumin: 3.4 g/dL — ABNORMAL LOW (ref 3.5–5.0)
BILIRUBIN TOTAL: 1.3 mg/dL — AB (ref 0.3–1.2)
BUN: 7 mg/dL (ref 6–20)
CALCIUM: 9.5 mg/dL (ref 8.9–10.3)
CO2: 34 mmol/L — ABNORMAL HIGH (ref 22–32)
Chloride: 94 mmol/L — ABNORMAL LOW (ref 101–111)
Creatinine, Ser: 0.62 mg/dL (ref 0.44–1.00)
GFR calc non Af Amer: >60 mL/min (ref >60)
Glucose, Bld: 219 mg/dL — ABNORMAL HIGH (ref 65–99)
Potassium: 4 mmol/L (ref 3.5–5.1)
SODIUM: 142 mmol/L (ref 135–145)
TOTAL PROTEIN: 6.8 g/dL (ref 6.5–8.1)

## 2016-01-10 NOTE — Progress Notes (Signed)
Pt assessed after lasix given by RN.  Pt is currently asleep,  no respiratory distress or increased wob noted. HR 77, rr24, spo2 92%.  Crackles/rhonchi noted in lung fields.  Upperairway wheezes also noted.  Nebulizer treatment not indicated/given at this time.  RT will continue to monitor and assess pt as needed.

## 2016-01-10 NOTE — IPOC Note (Signed)
Overall Plan of Care [facility]) Patient Details Name: AKEIA PEROT MRN: [id number] DOB: 08/13/1961  Admitting Diagnosis: DEBILITY ENCEPHALOPATHY  Hospital Problems: Principal Problem:   Debility Active Problems:   COPD (chronic obstructive pulmonary disease) (HCC)   DM type 2, uncontrolled, with renal complications (HCC)   Acute on chronic congestive heart failure (HCC)   Acute encephalopathy   Dysphagia   Anxiety state   Bipolar affective disorder in remission [facility])     Functional Problem List: Nursing Bowel, Bladder, Medication Management, Edema, Endurance, Motor, Nutrition, Pain, Safety, Skin Integrity  PT    OT Balance, Behavior, Cognition, Edema, Endurance, Motor, Pain, Safety, Sensory, Skin Integrity  SLP Nutrition  TR         Basic ADL's: OT Grooming, Bathing, Dressing, Toileting     Advanced  ADL's: OT       Transfers: PT    OT Toilet, Tub/Shower     Locomotion: PT       Additional Impairments: OT Fuctional Use of Upper Extremity  SLP Swallowing      TR      Anticipated Outcomes Item Anticipated Outcome  Self Feeding    Swallowing  mod I    Basic self-care  mod-max A  Toileting  min A   Bathroom Transfers supervision  Bowel/Bladder  manage with min assist  Transfers     Locomotion     Communication     Cognition     Pain  pain less than 3  Safety/Judgment  remain free from falls while on Rehab   Therapy Plan:   OT Intensity: Minimum of 1-2 x/day, 45 to 90 minutes OT Frequency: 5 out of 7 days OT Duration/Estimated Length of Stay: 2-3 weeks SLP Intensity: Minumum of 1-2 x/day, 30 to 90 minutes SLP Frequency: 3 to 5 out of 7 days SLP Duration/Estimated Length of Stay: 14-21 days        Team Interventions: Nursing Interventions Patient/Family Education, Bladder Management, Bowel Management, Disease Management/Prevention, Pain Management, Medication Management, Skin Care/Wound Management, Dysphagia/Aspiration Precaution Training,  Discharge Planning, Psychosocial Support  PT interventions    OT Interventions Balance/vestibular training, Cognitive remediation/compensation, Discharge planning, Community reintegration, Disease mangement/prevention, DME/adaptive equipment instruction, Functional mobility training, Neuromuscular re-education, Patient/family education, Self Care/advanced ADL retraining, Splinting/orthotics, Therapeutic Exercise, UE/LE Coordination activities, Wheelchair propulsion/positioning, Visual/perceptual remediation/compensation, UE/LE Strength taining/ROM, Therapeutic Activities, Skin care/wound managment, Psychosocial support, Pain management  SLP Interventions Dysphagia/aspiration precaution training, Cueing hierarchy  TR Interventions    SW/CM Interventions Discharge Planning, Psychosocial Support, Patient/Family Education    Team Discharge Planning: Destination: PT-  ,OT- Home , SLP-Home Projected Follow-up: PT- , OT-  Home health OT, 24 hour supervision/assistance, Skilled nursing facility, SLP-Home Health SLP, Outpatient SLP Projected Equipment Needs: PT- , OT- To be determined, SLP-None recommended by SLP Equipment Details: PT- , OT-  Patient/family involved in discharge planning: PT-  ,  OT-Patient, SLP-Patient  MD ELOS: 17-20 days. Medical Rehab Prognosis:  Excellent Assessment: 55 y.o. 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 She was started on IV diuresis for acute on chronic CHF and developed ARDS requiring intubation from 04/01. She was started on IV antibiotics for aspiration PNA due to vomiting during intubation. She was placed on heavy sedation and hospital course significant for metabolic encephalopathy. She has been treated with  IV diuresis as  well as steroids for AECOPD. Neurology felt encephalopathy was to be due to sedation as well as hypothyroid state. She tolerated vent wean and mentation has improved with extubation on 04/12. Swallow evaluation post extubation with signs of acute reversible dysphagia and patient kept NPO till 04/16. She was started on dysphagia 3, thin liquids as mentation improved with safe swallow strategies. Weights being monitored daily. She has had issues with delirium in the evening.   See Team Conference Notes for weekly updates to the plan of care
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
## 2016-01-10 NOTE — Progress Notes (Signed)
Patient information reviewed and entered into eRehab system by Vern Guerette, RN, CRRN, PPS Coordinator.  Information including medical coding and functional independence measure will be reviewed and updated through discharge.    

## 2016-01-10 NOTE — Progress Notes (Signed)
Pt called out to use the bathroom. Placed on bedpan and given time to void. Pt then said she was finished, but no urine was in bedpan. Bladder scanned for >350 and I&O cath for 375ml. Pt tolerated well. Pt anxious about breathing, and PRN xanax administered. Will continue to monitor. Rudie MeyerEurillo, Carron Jaggi A, RN

## 2016-01-10 NOTE — Progress Notes (Addendum)
PHYSICAL MEDICINE & REHABILITATION     PROGRESS NOTE  Subjective/Complaints:  Patient seen lying in bed this morning. She states she slept fairly overnight. Nursing notes the patient had difficulty breathing calling out for help with assessment by rapid response, however O2 sats remained around 98% throughout the night.  ROS: + SOB. Denies CP, N/V/D  Objective: Vital Signs: Blood pressure 149/85, pulse 74, temperature 98.5 F (36.9 C), temperature source Oral, resp. rate 20, height 4\' 9"  (1.448 m), weight 103.874 kg (229 lb), SpO2 98 %. Dg Chest 2 View  01/08/2016  CLINICAL DATA:  Shortness of breath and weakness EXAM: CHEST  2 VIEW COMPARISON:  January 06, 2016 FINDINGS: There is interstitial edema with cardiomegaly and mild pulmonary venous hypertension. There is patchy atelectasis in both lower lung zones. There is no airspace consolidation. No adenopathy. The patient is status post left total shoulder replacement as well as postoperative change in the proximal right humerus. There is postoperative change in the lower cervical region as well. IMPRESSION: Findings consistent with a degree of congestive heart failure. Areas of atelectasis in both lower lung zones. No airspace consolidation. Electronically Signed   By: Bretta BangWilliam  Woodruff III M.D.   On: 01/08/2016 19:05    Recent Labs  01/09/16 0903 01/10/16 0812  WBC 7.8 10.6*  HGB 8.8* 9.4*  HCT 31.0* 32.4*  PLT 225 248    Recent Labs  01/09/16 0903 01/10/16 0812  NA 144 142  K 4.1 4.0  CL 100* 94*  GLUCOSE 194* 219*  BUN 8 7  CREATININE 0.72 0.62  CALCIUM 9.2 9.5   CBG (last 3)   Recent Labs  01/09/16 1636 01/09/16 2055 01/10/16 0630  GLUCAP 352* 271* 174*    Wt Readings from Last 3 Encounters:  01/10/16 103.874 kg (229 lb)  01/09/16 103.42 kg (228 lb)  10/17/15 98.1 kg (216 lb 4.3 oz)    Physical Exam:  BP 149/85 mmHg  Pulse 74  Temp(Src) 98.5 F (36.9 C) (Oral)  Resp 20  Ht 4\' 9"  (1.448 m)  Wt  103.874 kg (229 lb)  BMI 49.54 kg/m2  SpO2 98% Constitutional: She appears well-developed and well-nourished. No distress.  HENT: Normocephalic and atraumatic.  Eyes: Conjunctivae and EOM are normal.  Cardiovascular: Normal rate and regular rhythm. Distant sounds  Respiratory: + [name]. Accessory muscle usage present. She has decreased breath sounds. She has wheezes. Labored breathing  GI: Soft. Bowel sounds are normal. She exhibits no distension. There is no tenderness.  Musculoskeletal: She exhibits edema. She exhibits no tenderness.  Bilateral pes cavus deformities.  Neurological: [name] alert and oriented.  RUE: frozen shoulder with limited movement at shoulder and fingers with contracture, otherwise 4-/5 prox to distal. LUE: 4+/5 proximal to distal  RLE: Hip flexion, knee extension 2+/5, ankle dorsi/plantar flexion 3/5 LLE: Flexion, knee extension 2+/5, ankle dorsi/plantar flexion 3/5 Skin: Skin is warm and dry.   Psychiatric: Her affect is labile. [name] anxious. She expresses impulsivity.    Assessment/Plan: 1. Functional deficits secondary to debility and encephalopathy due to AECOPD/aspiration PNA and acute exacerbation of CHF which require 3+ hours per day of interdisciplinary therapy in a comprehensive inpatient rehab setting. Physiatrist is providing close team supervision and 24 hour management of active medical problems listed below. Physiatrist and rehab team continue to assess barriers to discharge/monitor patient progress toward functional and medical goals.  Function:  Bathing Bathing position      Bathing parts  Bathing assist        Upper Body Dressing/Undressing Upper body dressing                    Upper body assist        Lower Body Dressing/Undressing Lower body dressing                                  Lower body assist        Toileting Toileting Toileting activity did not occur: Safety/medical concerns         Toileting assist     Transfers Chair/bed Optician, dispensing          Cognition Comprehension Comprehension assist level: Understands basic 75 - 89% of the time/ requires cueing 10 - 24% of the time  Expression Expression assist level: Expresses basic 90% of the time/requires cueing < 10% of the time.  Social Interaction Social Interaction assist level: Interacts appropriately 50 - 74% of the time - May be physically or verbally inappropriate.  Problem Solving Problem solving assist level: Solves basic 50 - 74% of the time/requires cueing 25 - 49% of the time  Memory Memory assist level: Recognizes or recalls 75 - 89% of the time/requires cueing 10 - 24% of the time    Medical Problem List and Plan: 1. Mobility, cognitive and functional deficits secondary to debility and encephalopathy due to AECOPD/aspiration PNA and acute exacerbation of CHF  Begin CIR 2. DVT Prophylaxis/Anticoagulation: Pharmaceutical: Lovenox 3. Pain Management: Tylenol prn  4. Mood: team to provide ego support. LCSW to follow for evaluation and support.  5. Neuropsych: This patient is not fully capable of making decisions on her own behalf. 6. Skin/Wound Care: Maintain adequate nutritional status. Edema control. Foam dressing to help absorb left ulcer drainage. Cleanse BLE wounds daily and apply bacitracin to old healing wounds.  7. Fluids/Electrolytes/Nutrition: Monitor I/O. Offer supplements if intake poor.   Currently with dysphagia, will advance diet as tolerated 8. Acute on chronic diastolic CHF: Check weights daily.   Baseline reported to be 215 lbs.   CXR with component of failure on 4/70--monitor for symptoms.   Continue lasix 40 mg bid with close monitoring of renal status--may need potassium for supplementation.   Continue lasix and metoprolol  Off cozaar at this time.   Weight 103 KG today 9. DMT2 with diabetic ulcer: Monitor BS ac/hs.    Amaryl d/c and Lantus decreased to 50 units today due to hypoglycemic episodes--likely due to steroid taper--to be tapered off on 4/22.   Monitor intake.   Will continue to monitor 10. AECOPD: decrease prednisone to  until 4/22. Continue oxygen prn for COPD--used albuterol prn at home. Continue duonebs qid.  11. BLE edema:Resolving. Elevate BLE when seated. Support stocking if needed.  12. Bipolar disorder with agorophobia: On Abilify and depakote--gabapentin was d/c. Continue to use xanax prn.   Will continue to monitor and consider psych consult if necessary 13. HCAP: Treated with antibiotics d/c 4/10. Encourage IS to help with bilateral atelectasis.  14. Metabolic encephalopathy/delirum: Resolving. Continue high-low bed for safety. Redirect patient as needed.  15. Acute renal failure with electrolyte abnormality: Resolved. Continue to monitor with diuresis ongoing.  16. Hypothyroid: with sick thyroid? On supplement with TSH  17. HTN: Monitor  BP bid. Continue lasix and lopressor.  18. Urinary retention:  Cont I/O cath, will consider meds 19. Morbid obesity:  Body mass index is 49.54 kg/(m^2).  Diet and exercise education  Will cont to encourage weight loss to increase endurance and promote overall health  LOS (Days) 1 A FACE TO FACE EVALUATION WAS PERFORMED  [name] 01/10/2016 10:01 AM
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
## 2016-01-10 NOTE — Discharge Instructions (Signed)
Inpatient Rehab Discharge Instructions  Evaristo BuryMary J Ghuman Discharge date and time:    Activities/Precautions/ Functional Status: Activity: activity as tolerated--limit oxygen to 2 liters  Diet: cardiac diet and diabetic diet Wound Care: none needed   Functional status:  ___ No restrictions     ___ Walk up steps independently ___ 24/7 supervision/assistance   ___ Walk up steps with assistance ___ Intermittent supervision/assistance  ___ Bathe/dress independently ___ Walk with walker     ___ Bathe/dress with assistance ___ Walk Independently    ___ Shower independently ___ Walk with assistance    ___ Shower with assistance _X__ No alcohol     ___ Return to work/school ________  Special Instructions:    My questions have been answered and I understand these instructions. I will adhere to these goals and the provided educational materials after my discharge from the hospital.  Patient/Caregiver Signature _______________________________ Date __________  Clinician Signature _______________________________________ Date __________  Please bring this form and your medication list with you to all your follow-up doctor's appointments.

## 2016-01-10 NOTE — Care Management Note (Signed)
[facility] Individual Statement of Services  Patient Name:  [name]  Date:  01/10/2016  Welcome to the [facility].  Our goal is to provide you with an individualized program based on your diagnosis and situation, designed to meet your specific needs.  With this comprehensive rehabilitation program, you will be expected to participate in at least 3 hours of rehabilitation therapies Monday-Friday, with modified therapy programming on the weekends.  Your rehabilitation program will include the following services:  Physical Therapy (PT), Occupational Therapy (OT), Speech Therapy (ST), 24 hour per day rehabilitation nursing, Therapeutic Recreaction (TR), Neuropsychology, Case Management (Social Worker), Rehabilitation Medicine, Nutrition Services and Pharmacy Services  Weekly team conferences will be held on Wednesday to discuss your progress.  Your Social Worker will talk with you frequently to get your input and to update you on team discussions.  Team conferences with you and your family in attendance may also be held.  Expected length of stay: 2.5-3 weeks   Overall anticipated outcome: min/mod level  Depending on your progress and recovery, your program may change. Your Social Worker will coordinate services and will keep you informed of any changes. Your Social Worker's name and contact numbers are listed  below.  The following services may also be recommended but are not provided by the [facility]:    Home Health Rehabiltiation Services  Outpatient Rehabilitation Services    Arrangements will be made to provide these services after discharge if needed.  Arrangements include referral to agencies that provide these services.  Your insurance has been verified to be:  Medicaid Your primary doctor is:  Fleet Contrasdwin Avbuere  Pertinent information will be shared with your doctor and your insurance company.  Social Worker:  Dossie DerBecky  Selena Swaminathan, SW [phone] or [phone]  Information discussed with and copy given to patient by: Lucy Chrisupree, Chrishaun Sasso G, 01/10/2016, 1:09 PM

## 2016-01-10 NOTE — Progress Notes (Signed)
Deatra Inaan Angiulli, PA notified because patient complaining of SOB. Expiratory wheezes heard in upper lungs, diminished lower lobes. Respiratory feels breathing treatment will not be effective at this time. Deatra Inaan Angiulli, PA ordered to go ahead and give 0800 dose of lasix. Lasix administered, O2 sats 98% on 1 L [place]. Pt appears anxious. Will continue to monitor. Rudie MeyerEurillo, Angelys Yetman A, RN

## 2016-01-10 NOTE — Progress Notes (Signed)
Physical Therapy Session Note  Patient Details  Name: [name] MRN: [id number] Date of Birth: 31-May-1961  Today's Date: 01/10/2016 PT Individual Time: [id number] PT Individual Time Calculation (min): 35 min   Short Term Goals: Week 1:  PT Short Term Goal 1 (Week 1): Pt will complete rolling in bed with Mod Awith use of bed rails. PT Short Term Goal 2 (Week 1): Pt will transfer supine<>sit with mod A. PT Short Term Goal 3 (Week 1): Pt will complete sliding board transfer with max A. PT Short Term Goal 4 (Week 1): Pt will propel w/c with BUE x 50 ft with Min A. PT Short Term Goal 5 (Week 1): Pt will maintain sitting balance without BUE support with supervision.   Skilled Therapeutic Interventions/Progress Updates:    Patient received supine in bed and agreeable to PT. PT explained the benefits of sitting in tilt in space WC for pressure relief and improved respiratory hygiene, Patient agreeable to transfer with [facility]. Patient performed Roll R and L x 2 each direction with Max A from PT, as well as cues for proper UE and LE movement and increased use of bed rails. Patient transported to tilt in space WC in maximove sling with assistance from SLP to manage WC and O2 tube. Patient able to maintain sitting position in tilt in space WC, at approx 55 degrees. PT instructed patient to [facility] until dinner to be able to eat dinner in upright  Position.   Therapy Documentation Precautions:  Precautions Precautions: Fall Precaution Comments: supplemental oxygen; orthopedic shoes Required Braces or Orthoses: Other Brace/Splint Other Brace/Splint: bil orthopedic shoes  Restrictions Weight Bearing Restrictions: No General:   Vital Signs: Therapy Vitals Temp: 98.3 F (36.8 C) Temp Source: Oral Pulse Rate: 86 Resp: (!) 22 BP: (!) 169/84 mmHg Patient Position (if appropriate): Lying Oxygen Therapy SpO2: 90 % O2 Device: Nasal Cannula O2 Flow Rate (L/min): 2 L/min   See Function  Navigator for Current Functional Status.   Therapy/Group: Individual Therapy  Golden Popustin E Denese Mentink 01/11/2016, 8:53 AM

## 2016-01-10 NOTE — Progress Notes (Signed)
Deatra Inaan Angiulli, PA ordered to increase O2 to 2L [place].

## 2016-01-10 NOTE — Plan of Care (Signed)
Problem: RH Bed Mobility Goal: LTG Patient will perform bed mobility with assist (PT) LTG: Patient will perform bed mobility with assistance, with/without cues (PT). Without hospital bed features  Problem: RH Bed to Chair Transfers Goal: LTG Patient will perform bed/chair transfers w/assist (PT) LTG: Patient will perform bed/chair transfers with assistance, with/without cues (PT). With LRAD  Problem: RH Ambulation Goal: LTG Patient will ambulate in controlled environment (PT) LTG: Patient will ambulate in a controlled environment, # of feet with assistance (PT). 30 ft with LRAD Goal: LTG Patient will ambulate in home environment (PT) LTG: Patient will ambulate in home environment, # of feet with assistance (PT). 25 ft with LRAD  Problem: RH Wheelchair Mobility Goal: LTG Patient will propel w/c in controlled environment (PT) LTG: Patient will propel wheelchair in controlled environment, # of feet with assist (PT) 150 ft Goal: LTG Patient will propel w/c in home environment (PT) LTG: Patient will propel wheelchair in home environment, # of feet with assistance (PT). 50 ft

## 2016-01-10 NOTE — Progress Notes (Signed)
Rapid response called just for a second set of eyes on patient. Pt complaining of SOB, O2 98%. Expiratory wheezes noted. Pt is yelling out, and anxious. Will continue to monitor. Rudie MeyerEurillo, Temeka Pore A, RN
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
## 2016-01-10 NOTE — Progress Notes (Signed)
Trish Mage, RN Rehab Admission Coordinator Signed Physical Medicine and Rehabilitation PMR Pre-admission 01/09/2016 11:47 AM  Related encounter: ED to Hosp-Admission (Discharged) from 12/22/2015 in [facility]    Expand All Collapse All   PMR Admission Coordinator Pre-Admission Assessment  Patient: [name] is an 55 y.o., female MRN: [id number] DOB: 1961-01-20 Height: 5' (152.4 cm) Weight: 103.42 kg (228 lb)  Insurance Information HMO: PPO: PCP: IPA: 80/20: OTHER:  PRIMARY: Medicaid [place] access Policy#: [id number] o Subscriber: [name] CM Name: Phone#: Fax#:  Pre-Cert#: Employer: Not employed Benefits: Phone #: [phone] Name: Automated Eff. Date: Eligible 01/09/16 with coverage code [id number] Deduct: Out of Pocket Max: Life Max:  CIR: SNF:  Outpatient: Co-Pay:  Home Health: Co-Pay:  DME: Co-Pay:  Providers  Emergency Contact Information Contact Information    Name Relation Home Work Mobile   [place] Significant other [phone]     Theone Murdoch Daughter [phone]     Salley Scarlet  [phone]       Current Medical History  Patient Admitting Diagnosis: Debility/encephalopathy after pneumonia/respiratory failure  History of Present Illness: A 55 y.o. 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 She was started on IV diuresis for acute on chronic CHF and developed ARDS requiring intubation from  04/01. She was started on IV antibiotics for likely aspiration PNA due to vomiting during intubation. She was placed on heavy sedation and hospital course significant for metabolic encephalopathy. Neurology felt encephalopathy was to be due to sedation as well as hypothyroid state. She tolerated vent wean and mentation has improved with extubation on 04/12. Swallow evaluation post extubation with signs of acute reversible dysphagia and patient NPO. Therapy evaluations done and patient with significant dizziness with attempts at bed mobility. CIR recommended by rehab team.   Past Medical History  Past Medical History  Diagnosis Date  . Hypertension   . Anemia   . Chronic back pain     "all over"  . Neuropathy (HCC)   . Hypercholesterolemia   . Major depressive disorder, recurrent (HCC)   . Generalized anxiety disorder   . Panic disorder with agoraphobia   . Tobacco abuse   . MI (myocardial infarction) (HCC) 2007    a. Per patient report she had a heart attack in 2007. Our consult note from 11/2006 indicates the patient had been seen in 07/2006 by her PCP and was told based on an EKG that she may have had a prior MI. She had undergone a low risk stress test at that time.  . Type II diabetes mellitus (HCC)   . Heart murmur   . Emphysema of lung (HCC)   . Pneumonia 1960's X 2  . On home oxygen therapy     "2L prn" (04/12/2015)  . Thin blood (HCC)   . History of blood transfusion     "related to OR"  . GERD (gastroesophageal reflux disease)   . Arthritis     "hands, left knee, left shoulder, back feet" (04/12/2015)  . Bipolar disorder (HCC)   . Rheumatic fever   . Mitral stenosis   . COPD (chronic obstructive pulmonary disease) (HCC)   . Hypothyroidism   . Chronic back pain   . PONV (postoperative nausea and vomiting)   . Seizures (HCC)  at age 70  . CHF (congestive heart  failure) (HCC)   . Fracture of right humerus 04/13/2015  . Left patella fracture 04/12/2015  . Shoulder fracture, right 04/12/2015  . Shortness of breath dyspnea     Family History  family history includes Heart attack in her mother; Heart disease in her mother; Hypertension in her mother; Lung cancer in her father; Stroke in her mother.  Prior Rehab/Hospitalizations: Was in [facility] in 01/17 to 02/17 after a back fracture.  Has the patient had major surgery during 100 days prior to admission? Yes. Had left knee surgery in November, 2016.  Current Medications   Current facility-administered medications:  . acetaminophen (TYLENOL) solution 650 mg, 650 mg, Oral, Q6H PRN, Coralyn Helling, MD, 650 mg at 01/06/16 2308 . ALPRAZolam Prudy Feeler) tablet 1 mg, 1 mg, Oral, TID PRN, Dorothea Ogle, MD, 1 mg at 01/08/16 2139 . antiseptic oral rinse (CPC / CETYLPYRIDINIUM CHLORIDE 0.05%) solution 7 mL, 7 mL, Mouth Rinse, BID, Dorothea Ogle, MD, 7 mL at 01/09/16 1000 . ARIPiprazole (ABILIFY) tablet 5 mg, 5 mg, Oral, Daily, Dorothea Ogle, MD, 5 mg at 01/09/16 0900 . aspirin chewable tablet 81 mg, 81 mg, Oral, Daily, Dorothea Ogle, MD, 81 mg at 01/09/16 0900 . divalproex (DEPAKOTE) DR tablet 250 mg, 250 mg, Oral, Q12H, Dorothea Ogle, MD, 250 mg at 01/09/16 0900 . enoxaparin (LOVENOX) injection 40 mg, 40 mg, Subcutaneous, Daily, Alberteen Sam, MD, 40 mg at 01/09/16 0900 . feeding supplement (GLUCERNA SHAKE) (GLUCERNA SHAKE) liquid 237 mL, 237 mL, Oral, BID BM, Dorothea Ogle, MD, 237 mL at 01/09/16 1045 . fentaNYL (SUBLIMAZE) injection 100 mcg, 100 mcg, Intravenous, Q2H PRN, Simonne Martinet, NP, 100 mcg at 01/03/16 0612 . [START ON 01/10/2016] furosemide (LASIX) tablet 40 mg, 40 mg, Oral, Daily, Dorothea Ogle, MD . guaiFENesin [facility]) 12 hr tablet 600 mg, 600 mg, Oral, BID, Dorothea Ogle, MD, 600 mg at 01/09/16 0900 . guaiFENesin-dextromethorphan (ROBITUSSIN DM) 100-10  MG/5ML syrup 5 mL, 5 mL, Oral, Q4H PRN, Dorothea Ogle, MD, 5 mL at 01/06/16 0952 . insulin aspart (novoLOG) injection 0-9 Units, 0-9 Units, Subcutaneous, TID WC, Dorothea Ogle, MD, 2 Units at 01/09/16 1156 . insulin glargine (LANTUS) injection 10 Units, 10 Units, Subcutaneous, QHS, Dorothea Ogle, MD, 10 Units at 01/08/16 2140 . ipratropium-albuterol (DUONEB) 0.5-2.5 (3) MG/3ML nebulizer solution 3 mL, 3 mL, Nebulization, Q2H PRN, Dorothea Ogle, MD . ipratropium-albuterol (DUONEB) 0.5-2.5 (3) MG/3ML nebulizer solution 3 mL, 3 mL, Nebulization, Q6H, Dorothea Ogle, MD, 3 mL at 01/09/16 0840 . levothyroxine (SYNTHROID, LEVOTHROID) tablet 125 mcg, 125 mcg, Oral, QAC breakfast, Dorothea Ogle, MD, 125 mcg at 01/09/16 0612 . metoprolol tartrate (LOPRESSOR) tablet 12.5 mg, 12.5 mg, Oral, BID, Dorothea Ogle, MD, 12.5 mg at 01/09/16 0900 . multivitamin with minerals tablet 1 tablet, 1 tablet, Oral, Daily, Dorothea Ogle, MD, 1 tablet at 01/09/16 0900 . [DISCONTINUED] ondansetron (ZOFRAN) tablet 4 mg, 4 mg, Oral, Q6H PRN **OR** ondansetron (ZOFRAN) injection 4 mg, 4 mg, Intravenous, Q6H PRN, Alberteen Sam, MD, 4 mg at 01/07/16 0141 . pantoprazole (PROTONIX) EC tablet 40 mg, 40 mg, Oral, Q breakfast, Dorothea Ogle, MD, 40 mg at 01/09/16 0900 . predniSONE (DELTASONE) tablet 10 mg, 10 mg, Oral, Q breakfast, Dorothea Ogle, MD, 10 mg at 01/09/16 2536 . simvastatin (ZOCOR) tablet 10 mg, 10 mg, Oral, q1800, Dorothea Ogle, MD, 10 mg at 01/08/16 1723 .  sodium chloride flush (NS) 0.9 % injection 10-40 mL, 10-40 mL, Intracatheter, Q12H, Praveen Mannam, MD, 10 mL at 01/09/16 0900 . sodium chloride flush (NS) 0.9 % injection 10-40 mL, 10-40 mL, Intracatheter, PRN, Chilton GreathousePraveen Mannam, MD  Patients Current Diet: DIET DYS 3 Room service appropriate?: Yes; Fluid consistency:: Thin Diet - low sodium heart healthy  Precautions / Restrictions Precautions Precautions: Fall Precaution Comments: Ng tube, rectal  pouch, foley Other Brace/Splint: bil orthopedic shoes in closet Restrictions Weight Bearing Restrictions: No   Has the patient had 2 or more falls or a fall with injury in the past year?Yes. Frequent falls at home as many as 6 a month. Suffered a back fracture last year.  Prior Activity Level Household: Homebound. Went out about 2 times a month for doctor appointments.  Home Assistive Devices / Equipment Home Assistive Devices/Equipment: Wheelchair, Bedside commode/3-in-1 Home Equipment: Environmental consultantWalker - 2 wheels, Howeane - single point, Morgan StanleyShower seat, Grab bars - tub/shower, Hand held shower head, Wheelchair - manual, Other (comment), Bedside commode (Hemiwalker)  Prior Device Use: Indicate devices/aids used by the patient prior to current illness, exacerbation or injury? Manual wheelchair and Walker  Prior Functional Level Prior Function Level of Independence: Needs assistance Gait / Transfers Assistance Needed: Pt now reports she used hemiwalker to stand-pivot to w/c and then used w/c to get around house.  ADL's / Homemaking Assistance Needed: Has a home health aide that assists with bathing and dressing, laundry, meals 3x per week for 3-4 hours. Significant other does meal prep, housekeeping, etc. Comments: used tub transfer bench for showering  Self Care: Did the patient need help bathing, dressing, using the toilet or eating? Needed some help  Indoor Mobility: Did the patient need assistance with walking from room to room (with or without device)? Needed some help  Stairs: Did the patient need assistance with internal or external stairs (with or without device)? Needed some help  Functional Cognition: Did the patient need help planning regular tasks such as shopping or remembering to take medications? Needed some help  Current Functional Level Cognition  Overall Cognitive Status: No family/caregiver present to determine baseline cognitive functioning (reports she has been w/c level  at home (?accuracy)) Orientation Level: Oriented to person, Oriented to place, Oriented to time, Oriented to situation   Extremity Assessment (includes Sensation/Coordination)  Upper Extremity Assessment: Defer to OT evaluation RUE Deficits / Details: Decreased ROM and strength at shoulder and hand  Lower Extremity Assessment: Generalized weakness RLE Deficits / Details: Noted increased edema and wounds. Decreased strength. RLE Sensation: history of peripheral neuropathy RLE Coordination: decreased fine motor, decreased gross motor LLE Deficits / Details: decreased strength noted, increased bilateral LE edema LLE Sensation: history of peripheral neuropathy LLE Coordination: decreased fine motor, decreased gross motor    ADLs       Mobility  Overal bed mobility: Needs Assistance, +2 for physical assistance, + 2 for safety/equipment Bed Mobility: Supine to Sit, Sit to Supine Rolling: Mod assist, +2 for physical assistance Supine to sit: HOB elevated, Mod assist, +2 for physical assistance Sit to supine: Mod assist, +2 for physical assistance General bed mobility comments: patient eager to attempt EOB and transfers; pt able to assist with all aspects of mobility, requires assist due to weakness    Transfers  Overall transfer level: Needs assistance Equipment used: 2 person hand held assist Transfers: Sit to/from Stand, Lateral/Scoot Transfers Sit to Stand: Mod assist, +2 safety/equipment, +2 physical assistance, From elevated surface (ICU bed at lowest height) Lateral/Scoot  Transfers: Mod assist, +2 safety/equipment, +2 physical assistance General transfer comment: unable to fully stand; able to assist with lateral scoot/side step    Ambulation / Gait / Stairs / Wheelchair Mobility  Ambulation/Gait Ambulation/Gait assistance: Min guard Ambulation Distance (Feet): 24 Feet Assistive device: Rolling walker (2 wheeled) Gait Pattern/deviations: Step-through pattern,  Decreased stride length, Shuffle, Trunk flexed General Gait Details: unable Gait velocity: decreased Gait velocity interpretation: Below normal speed for age/gender    Posture / Balance Balance Overall balance assessment: Needs assistance Sitting-balance support: No upper extremity supported, Feet unsupported Sitting balance-Leahy Scale: Fair Standing balance support: Bilateral upper extremity supported Standing balance-Leahy Scale: Poor    Special needs/care consideration BiPAP/CPAP No CPM No Continuous Drip IV 0.9% NS 75 mL/hr Dialysis No  Life Vest No Oxygen Yes, uses 02 [place] at home Special Bed No Trach Size No Wound Vac (area) No  Skin Has wounds on legs  Bowel mgmt: Last BM 01/05/16 Bladder mgmt: Incontinent at times Diabetic mgmt Yes, on insulin at home    Previous Home Environment Living Arrangements: Spouse/significant other Available Help at Discharge: Family, Available 24 hours/day Type of Home: Apartment Home Layout: One level Home Access: Level entry Bathroom Shower/Tub: Associate Professor: Yes Home Care Services: Yes Type of Home Care Services: Homehealth aide Home Care Agency (if known): does not remember name of agency  Discharge Living Setting Plans for Discharge Living Setting: Lives with (comment), Apartment (Lives with ex-husband in an apartment.) Type of Home at Discharge: Apartment Discharge Home Layout: One level Discharge Home Access: Level entry Does the patient have any problems obtaining your medications?: No  Social/Family/Support Systems Patient Roles: Spouse, Parent (Has an ex-husband, daughter.) Contact Information: [place] Silos - ex-husband/significant other [phone] Anticipated Caregiver: Ex-husband, daughter Anticipated Caregiver's Contact Information: Theone Murdoch - daughter [phone] Ability/Limitations of Caregiver: Daughter would  like patient placed in a nursing home after rehab. Patient does not want to go to a nursing home. Patient and ex-husband concerned about loss of monthly check if patient goes to a nursing home. Caregiver Availability: 24/7 Discharge Plan Discussed with Primary Caregiver: Yes Is Caregiver In Agreement with Plan?: Yes Does Caregiver/Family have Issues with Lodging/Transportation while Pt is in Rehab?: No  Goals/Additional Needs Patient/Family Goal for Rehab: PT/OT supervision, ST mod I goals Expected length of stay: 11-16 days Cultural Considerations: None Dietary Needs: Dys 3, thin liquids diet Equipment Needs: TBD Additional Information: I spoke with patient's daughter who is trying to get patient into a nursing home through the court system. Daughter is patient's payee, but not POA.  Pt/Family Agrees to Admission and willing to participate: Yes Program Orientation Provided & Reviewed with Pt/Caregiver Including Roles & Responsibilities: Yes  Decrease burden of Care through IP rehab admission: N/A  Possible need for SNF placement upon discharge:Yes, daughter prefers placement rather than home with patient's ex-husband  Patient Condition: This patient's medical and functional status has changed since the consult dated: 01/05/16 in which the Rehabilitation Physician determined and documented that the patient's condition is appropriate for intensive rehabilitative care in an inpatient rehabilitation facility. See "History of Present Illness" (above) for medical update. Functional changes are: Currently requiring mod assist for lateral scoot transfer, unable to stand on 01/08/16. Patient's medical and functional status update has been discussed with the Rehabilitation physician and patient remains appropriate for inpatient rehabilitation. Will admit to inpatient rehab today.  Preadmission Screen Completed By: Trish Mage, 01/09/2016 12:06  PM ______________________________________________________________________  Discussed status with  Dr. Riley Kill on 01/09/16 at 1202 and received telephone approval for admission today.  Admission Coordinator: Trish Mage, time1202/Date04/18/17          Cosigned by: Ranelle Oyster, MD at 01/09/2016 12:13 PM  Revision History     Date/Time User Provider Type Action   01/09/2016 12:13 PM Ranelle Oyster, MD Physician Cosign   01/09/2016 12:06 PM Trish Mage, RN Rehab Admission Coordinator Sign
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
## 2016-01-11 ENCOUNTER — Inpatient Hospital Stay (HOSPITAL_COMMUNITY)
Admission: AD | Admit: 2016-01-11 | Discharge: 2016-01-26 | DRG: 207 | Disposition: A | Discharge status: Home Health | Payer: Medicaid Other | Source: Intra-hospital | Attending: Internal Medicine | Admitting: Internal Medicine

## 2016-01-11 ENCOUNTER — Inpatient Hospital Stay (HOSPITAL_COMMUNITY): Payer: Self-pay | Admitting: Occupational Therapy

## 2016-01-11 ENCOUNTER — Inpatient Hospital Stay (HOSPITAL_COMMUNITY): Payer: Medicaid Other | Admitting: Physical Therapy

## 2016-01-11 ENCOUNTER — Encounter (HOSPITAL_COMMUNITY): Payer: Self-pay

## 2016-01-11 ENCOUNTER — Inpatient Hospital Stay (HOSPITAL_COMMUNITY): Payer: Self-pay | Admitting: Physical Therapy

## 2016-01-11 ENCOUNTER — Inpatient Hospital Stay (HOSPITAL_COMMUNITY): Payer: Medicaid Other | Admitting: Speech Pathology

## 2016-01-11 ENCOUNTER — Inpatient Hospital Stay (HOSPITAL_COMMUNITY): Payer: Medicaid Other

## 2016-01-11 DIAGNOSIS — E785 Hyperlipidemia, unspecified: Secondary | ICD-10-CM

## 2016-01-11 DIAGNOSIS — E118 Type 2 diabetes mellitus with unspecified complications: Secondary | ICD-10-CM

## 2016-01-11 DIAGNOSIS — Z9981 Dependence on supplemental oxygen: Secondary | ICD-10-CM | POA: Diagnosis not present

## 2016-01-11 DIAGNOSIS — I050 Rheumatic mitral stenosis: Secondary | ICD-10-CM | POA: Diagnosis present

## 2016-01-11 DIAGNOSIS — N390 Urinary tract infection, site not specified: Secondary | ICD-10-CM | POA: Diagnosis present

## 2016-01-11 DIAGNOSIS — Z96612 Presence of left artificial shoulder joint: Secondary | ICD-10-CM | POA: Diagnosis present

## 2016-01-11 DIAGNOSIS — Z87891 Personal history of nicotine dependence: Secondary | ICD-10-CM | POA: Diagnosis not present

## 2016-01-11 DIAGNOSIS — E662 Morbid (severe) obesity with alveolar hypoventilation: Secondary | ICD-10-CM | POA: Diagnosis present

## 2016-01-11 DIAGNOSIS — J9600 Acute respiratory failure, unspecified whether with hypoxia or hypercapnia: Secondary | ICD-10-CM

## 2016-01-11 DIAGNOSIS — Z6841 Body Mass Index (BMI) 40.0 and over, adult: Secondary | ICD-10-CM | POA: Diagnosis not present

## 2016-01-11 DIAGNOSIS — R5381 Other malaise: Secondary | ICD-10-CM | POA: Diagnosis not present

## 2016-01-11 DIAGNOSIS — I5033 Acute on chronic diastolic (congestive) heart failure: Secondary | ICD-10-CM | POA: Diagnosis present

## 2016-01-11 DIAGNOSIS — E1165 Type 2 diabetes mellitus with hyperglycemia: Secondary | ICD-10-CM | POA: Diagnosis present

## 2016-01-11 DIAGNOSIS — B961 Klebsiella pneumoniae [K. pneumoniae] as the cause of diseases classified elsewhere: Secondary | ICD-10-CM | POA: Diagnosis present

## 2016-01-11 DIAGNOSIS — Z7982 Long term (current) use of aspirin: Secondary | ICD-10-CM | POA: Diagnosis not present

## 2016-01-11 DIAGNOSIS — J441 Chronic obstructive pulmonary disease with (acute) exacerbation: Secondary | ICD-10-CM | POA: Diagnosis present

## 2016-01-11 DIAGNOSIS — E876 Hypokalemia: Secondary | ICD-10-CM | POA: Diagnosis present

## 2016-01-11 DIAGNOSIS — J1520 Pneumonia due to staphylococcus, unspecified: Secondary | ICD-10-CM | POA: Diagnosis present

## 2016-01-11 DIAGNOSIS — R1310 Dysphagia, unspecified: Secondary | ICD-10-CM | POA: Diagnosis present

## 2016-01-11 DIAGNOSIS — K219 Gastro-esophageal reflux disease without esophagitis: Secondary | ICD-10-CM | POA: Diagnosis present

## 2016-01-11 DIAGNOSIS — J42 Unspecified chronic bronchitis: Secondary | ICD-10-CM | POA: Diagnosis not present

## 2016-01-11 DIAGNOSIS — I509 Heart failure, unspecified: Secondary | ICD-10-CM | POA: Diagnosis not present

## 2016-01-11 DIAGNOSIS — F3170 Bipolar disorder, currently in remission, most recent episode unspecified: Secondary | ICD-10-CM | POA: Diagnosis not present

## 2016-01-11 DIAGNOSIS — F319 Bipolar disorder, unspecified: Secondary | ICD-10-CM | POA: Diagnosis present

## 2016-01-11 DIAGNOSIS — Z794 Long term (current) use of insulin: Secondary | ICD-10-CM | POA: Diagnosis not present

## 2016-01-11 DIAGNOSIS — E7800 Pure hypercholesterolemia, unspecified: Secondary | ICD-10-CM | POA: Diagnosis present

## 2016-01-11 DIAGNOSIS — G9341 Metabolic encephalopathy: Secondary | ICD-10-CM | POA: Diagnosis not present

## 2016-01-11 DIAGNOSIS — I10 Essential (primary) hypertension: Secondary | ICD-10-CM | POA: Diagnosis not present

## 2016-01-11 DIAGNOSIS — R569 Unspecified convulsions: Secondary | ICD-10-CM

## 2016-01-11 DIAGNOSIS — E039 Hypothyroidism, unspecified: Secondary | ICD-10-CM | POA: Diagnosis present

## 2016-01-11 DIAGNOSIS — J9622 Acute and chronic respiratory failure with hypercapnia: Secondary | ICD-10-CM | POA: Diagnosis present

## 2016-01-11 DIAGNOSIS — I252 Old myocardial infarction: Secondary | ICD-10-CM

## 2016-01-11 DIAGNOSIS — M549 Dorsalgia, unspecified: Secondary | ICD-10-CM | POA: Diagnosis present

## 2016-01-11 DIAGNOSIS — G40909 Epilepsy, unspecified, not intractable, without status epilepticus: Secondary | ICD-10-CM | POA: Diagnosis present

## 2016-01-11 DIAGNOSIS — E1142 Type 2 diabetes mellitus with diabetic polyneuropathy: Secondary | ICD-10-CM | POA: Diagnosis present

## 2016-01-11 DIAGNOSIS — J440 Chronic obstructive pulmonary disease with acute lower respiratory infection: Secondary | ICD-10-CM | POA: Diagnosis present

## 2016-01-11 DIAGNOSIS — J811 Chronic pulmonary edema: Secondary | ICD-10-CM

## 2016-01-11 DIAGNOSIS — E870 Hyperosmolality and hypernatremia: Secondary | ICD-10-CM | POA: Diagnosis not present

## 2016-01-11 DIAGNOSIS — IMO0001 Reserved for inherently not codable concepts without codable children: Secondary | ICD-10-CM | POA: Diagnosis present

## 2016-01-11 DIAGNOSIS — J189 Pneumonia, unspecified organism: Secondary | ICD-10-CM

## 2016-01-11 DIAGNOSIS — D649 Anemia, unspecified: Secondary | ICD-10-CM | POA: Diagnosis present

## 2016-01-11 DIAGNOSIS — J9621 Acute and chronic respiratory failure with hypoxia: Principal | ICD-10-CM | POA: Diagnosis present

## 2016-01-11 DIAGNOSIS — J9620 Acute and chronic respiratory failure, unspecified whether with hypoxia or hypercapnia: Secondary | ICD-10-CM | POA: Diagnosis present

## 2016-01-11 DIAGNOSIS — A047 Enterocolitis due to Clostridium difficile: Secondary | ICD-10-CM | POA: Diagnosis present

## 2016-01-11 DIAGNOSIS — J9601 Acute respiratory failure with hypoxia: Secondary | ICD-10-CM | POA: Diagnosis not present

## 2016-01-11 DIAGNOSIS — R339 Retention of urine, unspecified: Secondary | ICD-10-CM | POA: Diagnosis present

## 2016-01-11 DIAGNOSIS — F411 Generalized anxiety disorder: Secondary | ICD-10-CM | POA: Diagnosis not present

## 2016-01-11 DIAGNOSIS — G8929 Other chronic pain: Secondary | ICD-10-CM | POA: Diagnosis present

## 2016-01-11 DIAGNOSIS — E11649 Type 2 diabetes mellitus with hypoglycemia without coma: Secondary | ICD-10-CM | POA: Diagnosis present

## 2016-01-11 DIAGNOSIS — I959 Hypotension, unspecified: Secondary | ICD-10-CM | POA: Diagnosis not present

## 2016-01-11 DIAGNOSIS — E1043 Type 1 diabetes mellitus with diabetic autonomic (poly)neuropathy: Secondary | ICD-10-CM | POA: Diagnosis not present

## 2016-01-11 DIAGNOSIS — J9692 Respiratory failure, unspecified with hypercapnia: Secondary | ICD-10-CM | POA: Diagnosis not present

## 2016-01-11 DIAGNOSIS — G9340 Encephalopathy, unspecified: Secondary | ICD-10-CM | POA: Diagnosis present

## 2016-01-11 DIAGNOSIS — I110 Hypertensive heart disease with heart failure: Secondary | ICD-10-CM | POA: Diagnosis present

## 2016-01-11 DIAGNOSIS — Y95 Nosocomial condition: Secondary | ICD-10-CM | POA: Diagnosis present

## 2016-01-11 DIAGNOSIS — J449 Chronic obstructive pulmonary disease, unspecified: Secondary | ICD-10-CM | POA: Diagnosis present

## 2016-01-11 DIAGNOSIS — B9620 Unspecified Escherichia coli [E. coli] as the cause of diseases classified elsewhere: Secondary | ICD-10-CM | POA: Diagnosis present

## 2016-01-11 DIAGNOSIS — Z79899 Other long term (current) drug therapy: Secondary | ICD-10-CM

## 2016-01-11 DIAGNOSIS — Z452 Encounter for adjustment and management of vascular access device: Secondary | ICD-10-CM

## 2016-01-11 DIAGNOSIS — I340 Nonrheumatic mitral (valve) insufficiency: Secondary | ICD-10-CM | POA: Diagnosis not present

## 2016-01-11 DIAGNOSIS — J9690 Respiratory failure, unspecified, unspecified whether with hypoxia or hypercapnia: Secondary | ICD-10-CM

## 2016-01-11 DIAGNOSIS — R0602 Shortness of breath: Secondary | ICD-10-CM | POA: Diagnosis present

## 2016-01-11 DIAGNOSIS — Z978 Presence of other specified devices: Secondary | ICD-10-CM

## 2016-01-11 LAB — CBC WITH DIFFERENTIAL/PLATELET
Basophils Absolute: 0 10*3/uL (ref 0.0–0.1)
Basophils Relative: 0 %
Eosinophils Absolute: 0 10*3/uL (ref 0.0–0.7)
Eosinophils Relative: 0 %
HCT: 31.2 % — ABNORMAL LOW (ref 36.0–46.0)
HEMOGLOBIN: 9.1 g/dL — AB (ref 12.0–15.0)
LYMPHS ABS: 0.8 10*3/uL (ref 0.7–4.0)
LYMPHS PCT: 7 %
MCH: 24 pg — AB (ref 26.0–34.0)
MCHC: 29.2 g/dL — ABNORMAL LOW (ref 30.0–36.0)
MCV: 82.3 fL (ref 78.0–100.0)
MONOS PCT: 5 %
Monocytes Absolute: 0.5 10*3/uL (ref 0.1–1.0)
NEUTROS PCT: 88 %
Neutro Abs: 9.3 10*3/uL — ABNORMAL HIGH (ref 1.7–7.7)
Platelets: 250 10*3/uL (ref 150–400)
RBC: 3.79 MIL/uL — AB (ref 3.87–5.11)
RDW: 17.3 % — ABNORMAL HIGH (ref 11.5–15.5)
WBC: 10.6 10*3/uL — AB (ref 4.0–10.5)

## 2016-01-11 LAB — BLOOD GAS, ARTERIAL
ACID-BASE EXCESS: 22.2 mmol/L — AB (ref 0.0–2.0)
Acid-Base Excess: 15.1 mmol/L — ABNORMAL HIGH (ref 0.0–2.0)
BICARBONATE: 41.2 meq/L — AB (ref 20.0–24.0)
Bicarbonate: 47.8 mEq/L — ABNORMAL HIGH (ref 20.0–24.0)
DRAWN BY: 437071
Drawn by: 330991
FIO2: 100
FIO2: 0.4
O2 CONTENT: 5 L/min
O2 Saturation: 99.4 %
O2 Saturation: 94.6 %
PCO2 ART: 61 mmHg — AB (ref 35.0–45.0)
PH ART: 7.376 (ref 7.350–7.450)
PO2 ART: 169 mmHg — AB (ref 80.0–100.0)
PO2 ART: 68.9 mmHg — AB (ref 80.0–100.0)
Patient temperature: 97.7
Patient temperature: 97.3
TCO2: 43.4 mmol/L (ref 0–100)
TCO2: 49.7 mmol/L (ref 0–100)
pCO2 arterial: 71.5 mmHg (ref 35.0–45.0)
pH, Arterial: 7.502 — ABNORMAL HIGH (ref 7.350–7.450)

## 2016-01-11 LAB — URINE MICROSCOPIC-ADD ON: RBC / HPF: NONE SEEN RBC/hpf (ref 0–5)

## 2016-01-11 LAB — URINALYSIS, ROUTINE W REFLEX MICROSCOPIC
BILIRUBIN URINE: NEGATIVE
Glucose, UA: >1000 mg/dL — AB
Hgb urine dipstick: NEGATIVE
KETONES UR: NEGATIVE mg/dL
Leukocytes, UA: NEGATIVE
NITRITE: NEGATIVE
PROTEIN: NEGATIVE mg/dL
Specific Gravity, Urine: 1.012 (ref 1.005–1.030)
pH: 6.5 (ref 5.0–8.0)

## 2016-01-11 LAB — COMPREHENSIVE METABOLIC PANEL
ALK PHOS: 86 U/L (ref 38–126)
ALT: 17 U/L (ref 14–54)
AST: 14 U/L — ABNORMAL LOW (ref 15–41)
Albumin: 3.2 g/dL — ABNORMAL LOW (ref 3.5–5.0)
Anion gap: 13 (ref 5–15)
BILIRUBIN TOTAL: 1.4 mg/dL — AB (ref 0.3–1.2)
BUN: 11 mg/dL (ref 6–20)
CALCIUM: 9.5 mg/dL (ref 8.9–10.3)
CO2: 40 mmol/L — AB (ref 22–32)
CREATININE: 0.74 mg/dL (ref 0.44–1.00)
Chloride: 88 mmol/L — ABNORMAL LOW (ref 101–111)
Glucose, Bld: 299 mg/dL — ABNORMAL HIGH (ref 65–99)
Potassium: 3.6 mmol/L (ref 3.5–5.1)
Sodium: 141 mmol/L (ref 135–145)
TOTAL PROTEIN: 6.7 g/dL (ref 6.5–8.1)

## 2016-01-11 LAB — VALPROIC ACID LEVEL: Valproic Acid Lvl: 28 ug/mL — ABNORMAL LOW (ref 50.0–100.0)

## 2016-01-11 LAB — GLUCOSE, CAPILLARY
GLUCOSE-CAPILLARY: 284 mg/dL — AB (ref 65–99)
GLUCOSE-CAPILLARY: 464 mg/dL — AB (ref 65–99)
GLUCOSE-CAPILLARY: 433 mg/dL — AB (ref 65–99)
GLUCOSE-CAPILLARY: 340 mg/dL — AB (ref 65–99)
Glucose-Capillary: 347 mg/dL — ABNORMAL HIGH (ref 65–99)
Glucose-Capillary: 366 mg/dL — ABNORMAL HIGH (ref 65–99)
Glucose-Capillary: 417 mg/dL — ABNORMAL HIGH (ref 65–99)
Glucose-Capillary: 161 mg/dL — ABNORMAL HIGH (ref 65–99)

## 2016-01-11 LAB — PHOSPHORUS: Phosphorus: 3.2 mg/dL (ref 2.5–4.6)

## 2016-01-11 LAB — MAGNESIUM: MAGNESIUM: 1.7 mg/dL (ref 1.7–2.4)

## 2016-01-11 MED ORDER — ASPIRIN EC 81 MG PO TBEC
81.0000 mg | DELAYED_RELEASE_TABLET | Freq: Every day | ORAL | Status: DC
  Administered 2016-01-11 – 2016-01-17 (×6): 81 mg via ORAL
  Filled 2016-01-11 (×7): qty 1

## 2016-01-11 MED ORDER — ONDANSETRON HCL 4 MG/2ML IJ SOLN: 4.0000 mg | Freq: Four times a day (QID) | INTRAMUSCULAR | Status: DC | PRN

## 2016-01-11 MED ORDER — METOPROLOL TARTRATE 12.5 MG HALF TABLET
12.5000 mg | ORAL_TABLET | Freq: Two times a day (BID) | ORAL | Status: DC
  Administered 2016-01-11: 12.5 mg via ORAL
  Filled 2016-01-11 (×4): qty 1

## 2016-01-11 MED ORDER — MORPHINE SULFATE (PF) 2 MG/ML IV SOLN: 2.0000 mg | INTRAVENOUS | Status: DC | PRN

## 2016-01-11 MED ORDER — FUROSEMIDE 10 MG/ML IJ SOLN
INTRAMUSCULAR | Status: AC
  Administered 2016-01-11: 40 mg
  Filled 2016-01-11: qty 4

## 2016-01-11 MED ORDER — ONDANSETRON HCL 4 MG PO TABS: 4.0000 mg | ORAL_TABLET | Freq: Four times a day (QID) | ORAL | Status: DC | PRN

## 2016-01-11 MED ORDER — INSULIN GLARGINE 100 UNIT/ML ~~LOC~~ SOLN
10.0000 [IU] | Freq: Every day | SUBCUTANEOUS | Status: DC
  Administered 2016-01-12 – 2016-01-13 (×2): 10 [IU] via SUBCUTANEOUS
  Filled 2016-01-11 (×2): qty 0.1

## 2016-01-11 MED ORDER — LEVOTHYROXINE SODIUM 125 MCG PO TABS
125.0000 ug | ORAL_TABLET | Freq: Every day | ORAL | Status: DC
  Administered 2016-01-13: 125 ug via ORAL
  Filled 2016-01-11 (×2): qty 1

## 2016-01-11 MED ORDER — ALPRAZOLAM 0.5 MG PO TABS
0.5000 mg | ORAL_TABLET | Freq: Two times a day (BID) | ORAL | Status: DC
  Administered 2016-01-11 – 2016-01-12 (×2): 0.5 mg via ORAL
  Filled 2016-01-11 (×3): qty 1

## 2016-01-11 MED ORDER — METOPROLOL TARTRATE 25 MG PO TABS: 25.0000 mg | ORAL_TABLET | Freq: Two times a day (BID) | ORAL | Status: DC

## 2016-01-11 MED ORDER — POTASSIUM CHLORIDE CRYS ER 20 MEQ PO TBCR
20.0000 meq | EXTENDED_RELEASE_TABLET | Freq: Every day | ORAL | Status: DC
  Administered 2016-01-13 – 2016-01-14 (×2): 20 meq via ORAL
  Filled 2016-01-11 (×4): qty 1

## 2016-01-11 MED ORDER — SODIUM CHLORIDE 0.9 % IV SOLN
250.0000 mL | INTRAVENOUS | Status: DC | PRN
  Administered 2016-01-13 (×2): 250 mL via INTRAVENOUS

## 2016-01-11 MED ORDER — ARIPIPRAZOLE 5 MG PO TABS
5.0000 mg | ORAL_TABLET | Freq: Every day | ORAL | Status: DC
  Administered 2016-01-11 – 2016-01-26 (×13): 5 mg via ORAL
  Filled 2016-01-11 (×16): qty 1

## 2016-01-11 MED ORDER — DIVALPROEX SODIUM 250 MG PO DR TAB
250.0000 mg | DELAYED_RELEASE_TABLET | Freq: Two times a day (BID) | ORAL | Status: DC
  Administered 2016-01-11: 250 mg via ORAL
  Filled 2016-01-11 (×3): qty 1

## 2016-01-11 MED ORDER — SODIUM CHLORIDE 0.9% FLUSH
3.0000 mL | INTRAVENOUS | Status: DC | PRN
Start: 2016-01-11 — End: 2016-01-26
  Administered 2016-01-16 – 2016-01-24 (×3): 3 mL via INTRAVENOUS
  Filled 2016-01-11 (×3): qty 3

## 2016-01-11 MED ORDER — PANTOPRAZOLE SODIUM 40 MG PO TBEC
40.0000 mg | DELAYED_RELEASE_TABLET | Freq: Every day | ORAL | Status: DC
  Administered 2016-01-11 – 2016-01-12 (×2): 40 mg via ORAL
  Filled 2016-01-11 (×2): qty 1

## 2016-01-11 MED ORDER — SIMVASTATIN 10 MG PO TABS
10.0000 mg | ORAL_TABLET | Freq: Every day | ORAL | Status: DC
  Administered 2016-01-11 – 2016-01-25 (×12): 10 mg via ORAL
  Filled 2016-01-11 (×15): qty 1

## 2016-01-11 MED ORDER — INSULIN ASPART 100 UNIT/ML ~~LOC~~ SOLN: 0.0000 [IU] | Freq: Every day | SUBCUTANEOUS | Status: DC

## 2016-01-11 MED ORDER — LOSARTAN POTASSIUM 50 MG PO TABS
100.0000 mg | ORAL_TABLET | Freq: Every day | ORAL | Status: DC
  Administered 2016-01-11: 100 mg via ORAL
  Filled 2016-01-11 (×3): qty 2

## 2016-01-11 MED ORDER — FUROSEMIDE 10 MG/ML IJ SOLN
80.0000 mg | Freq: Two times a day (BID) | INTRAMUSCULAR | Status: DC
  Administered 2016-01-11 – 2016-01-14 (×6): 80 mg via INTRAVENOUS
  Filled 2016-01-11 (×6): qty 8

## 2016-01-11 MED ORDER — ACETAMINOPHEN 325 MG PO TABS
650.0000 mg | ORAL_TABLET | Freq: Four times a day (QID) | ORAL | Status: DC | PRN
  Administered 2016-01-14: 650 mg via ORAL
  Filled 2016-01-11: qty 2

## 2016-01-11 MED ORDER — HEPARIN SODIUM (PORCINE) 5000 UNIT/ML IJ SOLN
5000.0000 [IU] | Freq: Three times a day (TID) | INTRAMUSCULAR | Status: DC
  Administered 2016-01-11 – 2016-01-26 (×43): 5000 [IU] via SUBCUTANEOUS
  Filled 2016-01-11 (×45): qty 1

## 2016-01-11 MED ORDER — FUROSEMIDE 40 MG PO TABS: 40.0000 mg | ORAL_TABLET | Freq: Every day | ORAL | Status: DC

## 2016-01-11 MED ORDER — IPRATROPIUM-ALBUTEROL 0.5-2.5 (3) MG/3ML IN SOLN: 3.0000 mL | RESPIRATORY_TRACT | Status: DC | PRN

## 2016-01-11 MED ORDER — SODIUM CHLORIDE 0.9% FLUSH
3.0000 mL | Freq: Two times a day (BID) | INTRAVENOUS | Status: DC
  Administered 2016-01-12 – 2016-01-25 (×12): 3 mL via INTRAVENOUS

## 2016-01-11 MED ORDER — INSULIN GLARGINE 100 UNIT/ML ~~LOC~~ SOLN
25.0000 [IU] | Freq: Every day | SUBCUTANEOUS | Status: DC
  Filled 2016-01-11: qty 0.25

## 2016-01-11 MED ORDER — INSULIN ASPART 100 UNIT/ML ~~LOC~~ SOLN
0.0000 [IU] | Freq: Three times a day (TID) | SUBCUTANEOUS | Status: DC
  Administered 2016-01-12: 5 [IU] via SUBCUTANEOUS

## 2016-01-11 MED ORDER — ACETAMINOPHEN 650 MG RE SUPP
650.0000 mg | Freq: Four times a day (QID) | RECTAL | Status: DC | PRN
  Administered 2016-01-13 – 2016-01-14 (×2): 650 mg via RECTAL
  Filled 2016-01-11 (×2): qty 1

## 2016-01-11 MED ORDER — SODIUM CHLORIDE 0.9% FLUSH
3.0000 mL | Freq: Two times a day (BID) | INTRAVENOUS | Status: DC
  Administered 2016-01-12 (×2): 3 mL via INTRAVENOUS
  Administered 2016-01-13: 10 mL via INTRAVENOUS
  Administered 2016-01-14 – 2016-01-18 (×5): 3 mL via INTRAVENOUS

## 2016-01-11 MED ORDER — INSULIN ASPART 100 UNIT/ML ~~LOC~~ SOLN
15.0000 [IU] | Freq: Once | SUBCUTANEOUS | Status: AC
  Administered 2016-01-11: 15 [IU] via SUBCUTANEOUS

## 2016-01-11 NOTE — Progress Notes (Signed)
Late entry: 01/11/16 1250 nursing Pam PA notified of cbg 464 . Order to recheck cbg. CBG at 1308 433. Pam notified order to recheck cbg after 1 hour. cbg after 1 hour 417. Dan notified.
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
## 2016-01-11 NOTE — Progress Notes (Signed)
Placed patient on BiPAP following ABG per resident.

## 2016-01-11 NOTE — Progress Notes (Signed)
Patient is stable on 5L nasal cannula with no signs of respiratory distress. BiPAP not placed on at this time. RT will continue to closely monitor.

## 2016-01-11 NOTE — Significant Event (Signed)
Rapid Response Event Note  Overview: Time Called: 1410 Arrival Time: 1415 Event Type: Respiratory  Initial Focused Assessment: Patient with decreased LOC, and increased WOB.  Lung sounds with crackles Heart tones irregular BP153/89  SR with PACs 79  RR 30-36 O2 sats 100% on NRB Patient is able to respond, but is weak and has a very weak cough  Interventions: 40mg  Lasix given IV ABG done PCXR done Foley placed, Clear yellow urine Patient looks alittle better, ABG results ph 7.37 PC02 71 PO2 169 Bicarb 41 Attempted to wean O2, O2 sats 97% on 55% Venti.  When patient on 4L [place] she became very pale and less responsive O2 sat 91%.  Placed back on 55% Venti. Patient remains weak but responsive 1600 Dr Konrad DoloresMerrell at bedside.  He agreed patient should go to ICU at this time.   1610 Patient transported to 2M10 via bed with O2 and heart monitor.   Event Summary: Name of Physician Notified: Dan PA at bedside at    Name of Consulting Physician Notified: Dr Evelena PeatMerrel at 1420  Outcome: Transferred (Comment)     [name], [name]

## 2016-01-11 NOTE — Progress Notes (Signed)
[place] PHYSICAL MEDICINE & REHABILITATION     PROGRESS NOTE  Subjective/Complaints:  Patient seen lying in bed this morning. She continues to pain.  ROS: + SOB. Denies CP, N/V/D  Objective: Vital Signs: Blood pressure 184/100, pulse 104, temperature 98.4 F (36.9 C), temperature source Oral, resp. rate 24, height  (1.448 m), weight 101.9 kg (224 lb 10.4 oz), SpO2 92 %. No results found.  Recent Labs  01/09/16 0903 01/10/16 0812  WBC 7.8 10.6*  HGB 8.8* 9.4*  HCT 31.0* 32.4*  PLT 225 248    Recent Labs  01/09/16 0903 01/10/16 0812  NA 144 142  K 4.1 4.0  CL 100* 94*  GLUCOSE 194* 219*  BUN 8 7  CREATININE 0.72 0.62  CALCIUM 9.2 9.5   CBG (last 3)   Recent Labs  01/10/16 2155 01/11/16 0654 01/11/16 0923  GLUCAP 300* 347* 366*    Wt Readings from Last 3 Encounters:  01/11/16 101.9 kg (224 lb 10.4 oz)  01/09/16 103.42 kg (228 lb)  10/17/15 98.1 kg (216 lb 4.3 oz)    Physical Exam:  BP 184/100 mmHg  Pulse 104  Temp(Src) 98.4 F (36.9 C) (Oral)  Resp 24  Ht  (1.448 m)  Wt 101.9 kg (224 lb 10.4 oz)  BMI 48.60 kg/m2  SpO2 92% Constitutional: She appears well-developed and well-nourished. No distress.  HENT: Normocephalic and atraumatic.  Eyes: Conjunctivae and EOM are normal.  Cardiovascular: Normal rate and regular rhythm. Distant sounds  Respiratory: + . Accessory muscle usage present. She has decreased breath sounds. Labored breathing  GI: Soft. Bowel sounds are normal. She exhibits no distension. There is no tenderness.  Musculoskeletal: She exhibits edema. She exhibits no tenderness.  Bilateral pes cavus deformities.  Neurological: [name] alert and oriented.  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 LUE: 4+/5 proximal to distal  RLE: Hip flexion, knee extension 2+/5, ankle dorsi/plantar flexion 3/5 LLE: Flexion, knee extension 2+/5, ankle dorsi/plantar flexion  3/5 Skin: Skin is warm and dry.   Psychiatric: Her affect is labile. [name] anxious. She expresses impulsivity.    Assessment/Plan: 1. Functional deficits secondary to debility and encephalopathy due to AECOPD/aspiration PNA and acute exacerbation of CHF which require 3+ hours per day of interdisciplinary therapy in a comprehensive inpatient rehab setting. Physiatrist is providing close team supervision and 24 hour management of active medical problems listed below. Physiatrist and rehab team continue to assess barriers to discharge/monitor patient progress toward functional and medical goals.  Function:  Bathing Bathing position   Position: Bed  Bathing parts Body parts bathed by patient: Chest, Abdomen Body parts bathed by helper: Right arm, Left arm, Front perineal area, Buttocks, Right upper leg, Left upper leg, Right lower leg, Left lower leg, Back  Bathing assist Assist Level: 2 helpers      Upper Body Dressing/Undressing Upper body dressing   What is the patient wearing?: Hospital gown                Upper body assist Assist Level: Touching or steadying assistance(Pt > 75%)      Lower Body Dressing/Undressing Lower body dressing   What is the patient wearing?: Hospital Gown, Non-skid slipper socks           Non-skid slipper socks- Performed by helper: Don/doff right sock, Don/doff left sock                  Lower body  assist Assist for lower body dressing: Touching or steadying assistance (Pt > 75%)      Toileting Toileting Toileting activity did not occur: Safety/medical concerns   Toileting steps completed by helper: Adjust clothing prior to toileting, Performs perineal hygiene, Adjust clothing after toileting    Toileting assist Assist level: Two helpers   Transfers Chair/bed transfer   Chair/bed transfer method: Lateral scoot Chair/bed transfer assist level: 2 helpers Chair/bed transfer assistive device: Mechanical lift Mechanical lift:  Management consultantMaximove   Locomotion Ambulation           Wheelchair          Cognition Comprehension Comprehension assist level: Follows basic conversation/direction with extra time/assistive device  Expression Expression assist level: Expresses basic needs/ideas: With extra time/assistive device  Social Interaction Social Interaction assist level: Interacts appropriately 75 - 89% of the time - Needs redirection for appropriate language or to initiate interaction.  Problem Solving Problem solving assist level: Solves basic 25 - 49% of the time - needs direction more than half the time to initiate, plan or complete simple activities  Memory Memory assist level: Recognizes or recalls 75 - 89% of the time/requires cueing 10 - 24% of the time    Medical Problem List and Plan: 1. Mobility, cognitive and functional deficits secondary to debility and encephalopathy due to AECOPD/aspiration PNA and acute exacerbation of CHF  Continue CIR 2. DVT Prophylaxis/Anticoagulation: Pharmaceutical: Lovenox 3. Pain Management: Tylenol prn  4. Mood: team to provide ego support. LCSW to follow for evaluation and support.  5. Neuropsych: This patient is not fully capable of making decisions on her own behalf. 6. Skin/Wound Care: Maintain adequate nutritional status. Edema control. Foam dressing to help absorb left ulcer drainage. Cleanse BLE wounds daily and apply bacitracin to old healing wounds.  7. Fluids/Electrolytes/Nutrition: Monitor I/O. Offer supplements if intake poor.   Currently with dysphagia, will advance diet as tolerated 8. Acute on chronic diastolic CHF: Check weights daily.   Baseline reported to be 215 lbs.   CXR with component of failure on 4/70--monitor for symptoms.   Continue lasix 40 mg bid with close monitoring of renal status--may need potassium for supplementation.   Continue lasix and metoprolol  Off cozaar at this time.   Weight 101.9 KG today 9. DMT2 with diabetic ulcer:  Monitor BS ac/hs.   Amaryl d/c and Lantus decreased due to hypoglycemic episodes (home dose 100 units)--likely due to steroid taper--to be tapered off on 4/22.  Lantus increased to 25 units on 4/20   Monitor intake.   Will continue to monitor 10. AECOPD: decrease prednisone to 5mg  until 4/22. Continue oxygen prn for COPD--used albuterol prn at home. Continue duonebs.  11. BLE edema:Resolving. Elevate BLE when seated. Support stocking if needed.  12. Bipolar disorder with agorophobia: On Abilify and depakote--gabapentin was d/c. Continue to use xanax prn.   Will continue to monitor and consider psych consult if necessary 13. HCAP: Treated with antibiotics d/c 4/10. Encourage IS to help with bilateral atelectasis.  14. Metabolic encephalopathy/delirum: Resolving. Continue high-low bed for safety. Redirect patient as needed.  15. Acute renal failure with electrolyte abnormality: Resolved. Continue to monitor with diuresis ongoing.  16. Hypothyroid: with sick thyroid? On supplement with TSH  17. HTN: Monitor BP.   Continue lasix  Lopressor increased to 25 mg on 4/20  18. Urinary retention: Improving  Cont I/O cath, will consider meds 19. Morbid obesity:  Body mass index is 48.6 kg/(m^2).  Diet and exercise education  Will  cont to encourage weight loss to increase endurance and promote overall health  LOS (Days) 2 A FACE TO FACE EVALUATION WAS PERFORMED  Ankit Karis Juba 01/11/2016 10:23 AM

## 2016-01-11 NOTE — Progress Notes (Signed)
01/11/16 1608 nursing Report given to Lauren RN patient transferring to 2100 by Rapid response RN and CN per bed with venti mask on 02 at 55%.

## 2016-01-11 NOTE — Progress Notes (Signed)
Occupational Therapy Note  Patient Details  Name: Evaristo BuryMary J Linderman MRN: [id number] Date of Birth: 30-Oct-1960  Today's Date: 01/11/2016 OT Missed Time: 60 Minutes Missed Time Reason: MD hold (comment) (medically unstable and transfering off unit)  Pt with medical concerns this pm and is currently on hold for therapy. Pt to transfer off the unit today. Missed 60 min of skilled OT.   Roney MansSmith, Manjot Beumer [facility] 01/11/2016, 3:16 PM

## 2016-01-11 NOTE — Progress Notes (Signed)
CRITICAL VALUE ALERT  Critical value received:  ABG results pH 7.5 CO2 61 O2 69 Bicarb 47  Date of notification:  01/11/16  Time of notification:  2040  Critical value read back:Yes.    Nurse who received alert:  Vikki PortsValerie  MD notified (1st page):  Gwynn BurlyAndrew Wallace  Time of first page:  2040  MD notified (2nd page):  Time of second page:  Responding MD:  Gwynn BurlyAndrew Wallace  Time MD responded:  2045

## 2016-01-11 NOTE — Progress Notes (Signed)
Initial Nutrition Assessment  DOCUMENTATION CODES:   Morbid obesity  INTERVENTION:  Once diet advances, provide Glucerna Shake po TID, each supplement provides 220 kcal and 10 grams of protein.  RD to continue to monitor.   NUTRITION DIAGNOSIS:   Inadequate oral intake related to inability to eat as evidenced by NPO status.  GOAL:   Patient will meet greater than or equal to 90% of their needs  MONITOR:   Diet advancement, Weight trends, Labs, I & O's, Skin  REASON FOR ASSESSMENT:   Low Braden    ASSESSMENT:   55 y.o. 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 She was started on IV diuresis for acute on chronic CHF and developed ARDS requiring intubation from 04/01. Extubated, Pt on dysphagia 3 diet.  FEES evaluation done today. Pt with severe oral and oropharyngeal dysphagia due to decreased responsiveness. Pt is now NPO until responsiveness improves. During time of visit and prior to NPO status, pt reports appetite is fine however she does not favor the food much. Pt reports eating well PTA with no other difficulties. Meal completion has been 15-25%. Pt has Glucerna Shake ordered and has been consuming them prior to NPO. Per Epic weight records, weight has been stable. Noted pt to be transferred to acute due to respiratory issues. RD to continue to monitor.   Pt with no observed significant fat and muscle mass loss.   Labs and medications reviewed. CBG's 207-464 mg/dL.  Diet Order:  Diet NPO time specified  Skin:  Wound (see comment) (Stage 2 pressure ulcer on sacrum)  Last BM:  4/19  Height:   Ht Readings from Last 1 Encounters:  01/09/16 4\' 9"  (1.448 m)    Weight:   Wt Readings from Last 1 Encounters:  01/11/16 224 lb  10.4 oz (101.9 kg)    Ideal Body Weight:  43 kg  BMI:  Body mass index is 48.6 kg/(m^2).  Estimated Nutritional Needs:   Kcal:  1850-2050  Protein:  100-110 grams  Fluid:  1.8 - 2 L/day  EDUCATION NEEDS:   No education needs identified at this time  Roslyn SmilingStephanie Chaselyn Nanney, MS, RD, LDN Pager # [phone] After hours/ weekend pager # [phone]

## 2016-01-11 NOTE — Progress Notes (Signed)
SLP Cancellation Note  Patient Details Name: [name] MRN: [id number] DOB: Apr 11, 1961   Cancelled treatment:        Pt with RN and being cathed upon SLP's arrival.  As a result, pt missed 30 minutes of skilled ST.  Will follow up at next available appointment.                                                                                                  Lindsy Cerullo, Melanee SpryNicole L 01/11/2016, 1:22 PM

## 2016-01-11 NOTE — Discharge Summary (Signed)
Physician Discharge Summary  Patient ID: [name] MRN: [id number] DOB/AGE: 02-15-1961 55 y.o.  Admit date: 01/09/2016 Discharge date: 01/11/2016  Discharge Diagnoses:  Principal Problem:   Acute on chronic congestive heart failure (HCC) Active Problems:   COPD (chronic obstructive pulmonary disease) (HCC)   DM type 2, uncontrolled, with renal complications (HCC)   Acute encephalopathy   Debility   Dysphagia   Anxiety state   Bipolar affective disorder in remission [facility])   Essential hypertension   Urinary retention with incomplete bladder emptying   Discharged Condition: stable.   Significant Diagnostic Studies: Dg Chest 2 View  01/08/2016  CLINICAL DATA:  Shortness of breath and weakness EXAM: CHEST  2 VIEW COMPARISON:  January 06, 2016 FINDINGS: There is interstitial edema with cardiomegaly and mild pulmonary venous hypertension. There is patchy atelectasis in both lower lung zones. There is no airspace consolidation. No adenopathy. The patient is status post left total shoulder replacement as well as postoperative change in the proximal right humerus. There is postoperative change in the lower cervical region as well. IMPRESSION: Findings consistent with a degree of congestive heart failure. Areas of atelectasis in both lower lung zones. No airspace consolidation. Electronically Signed   By: Bretta Bang III M.D.   On: 01/08/2016 19:05   Dg Chest 2 View  01/06/2016  CLINICAL DATA:  55 year old female with a several hour history of dry cough and dyspnea. Medical history includes COPD and CHF. EXAM: CHEST  2 VIEW COMPARISON:  Prior chest x-ray 01/04/2016 FINDINGS: Left IJ approach central venous catheter remains in stable position with the tip projecting over the superior cavoatrial junction. The feeding tube is been removed. Stable cardiac and mediastinal contours. Improved pulmonary edema compared to prior imaging. There is still some persistent pulmonary vascular congestion.  No new focal airspace consolidation, large effusion or evidence of pneumothorax. Incompletely imaged cervical thoracic stabilization hardware, left shoulder arthroplasty prosthesis and ORIF of a right proximal humerus fracture. IMPRESSION: 1. Improved pulmonary edema compared to 01/04/2016. There is mild residual pulmonary vascular congestion. 2. Stable cardiomegaly. 3. No new or acute cardiopulmonary process. Electronically Signed   By: Malachy Moan M.D.   On: 01/06/2016 10:29   Dg Chest 2 View  12/22/2015  CLINICAL DATA:  Acute onset of bilateral leg pain and shortness of breath. Wounds on both legs. Initial encounter. EXAM: CHEST  2 VIEW COMPARISON:  Chest radiograph performed 10/16/2015 FINDINGS: The lungs are well-aerated. Mild vascular congestion is noted. Mildly increased interstitial markings may reflect mild interstitial edema. No pleural effusion or pneumothorax is seen. The heart is mildly enlarged. No acute osseous abnormalities are seen. Hardware is noted along the proximal humerus bilaterally. Cervical spinal fusion hardware is partially imaged. IMPRESSION: Mild vascular congestion noted. Mildly increased interstitial markings may reflect mild interstitial edema. Mild cardiomegaly noted. Electronically Signed   By: Roanna Raider M.D.   On: 12/22/2015 22:53   Ct Head Wo Contrast  01/01/2016  CLINICAL DATA:  Acute encephalopathy. EXAM: CT HEAD WITHOUT CONTRAST TECHNIQUE: Contiguous axial images were obtained from the base of the skull through the vertex without intravenous contrast. COMPARISON:  CT scan of October 16, 2015. FINDINGS: Fluid is noted in the mastoid air cells bilaterally. Bony calvarium appears intact. Stable diffuse cortical atrophy is noted. Mild chronic ischemic white matter disease is noted. No mass effect or midline shift is noted. Ventricular size is within normal limits. There is no evidence of mass lesion, hemorrhage or acute infarction. IMPRESSION: Stable diffuse  cortical atrophy. Mild chronic ischemic white matter disease. No acute intracranial abnormality seen. Electronically Signed   By: Lupita Raider, M.D.   On: 01/01/2016 15:31   Dg Chest Port 1 View  01/04/2016  CLINICAL DATA:  Respiratory failure. EXAM: PORTABLE CHEST 1 VIEW COMPARISON:  01/02/2016 . FINDINGS: Feeding tube and and left IJ line in stable position. Cardiomegaly with persistent diffuse bilateral pulmonary interstitial changes consistent congestive heart failure again noted. Basilar atelectasis and/or infiltrates again noted. Small bilateral pleural effusions. No pneumothorax. Prior cervical spine fusion. Postsurgical changes both shoulders again noted . IMPRESSION: 1. Lines and tubes in stable position. 2. Persistent changes of congestive heart failure with bilateral pulmonary interstitial edema. Basilar atelectasis and/or infiltrates again noted . Small bilateral effusions again noted. Electronically Signed   By: Maisie Fus  Register   On: 01/04/2016 07:15     Labs:  Basic Metabolic Panel:  Recent Labs Lab 01/05/16 0429 01/06/16 0400 01/07/16 0348 01/08/16 0415 01/09/16 0903 01/10/16 0812  NA 149* 141 141 145 144 142  K 3.4* 3.2* 4.2 3.9 4.1 4.0  CL 99* 93* 94* 100* 100* 94*  CO2 40* 39* 39* 40* 36* 34*  GLUCOSE 130* 117* 78 77 194* 219*  BUN 34* 24* CREATININE 0.69 0.60 0.63 0.68 0.72 0.62  CALCIUM 9.3 9.3 9.2 8.8* 9.2 9.5  MG 2.0  --   --   --   --   --   PHOS 3.7  --   --   --   --   --     CBC:  Recent Labs Lab 01/08/16 0415 01/09/16 0903 01/10/16 0812  WBC 9.1 7.8 10.6*  NEUTROABS  --   --  8.8*  HGB 8.4* 8.8* 9.4*  HCT 28.0* 31.0* 32.4*  MCV 82.8 83.3 83.1  PLT 219 225 248    CBG:  Recent Labs Lab 01/11/16 0654 01/11/16 0923 01/11/16 1149 01/11/16 1307 01/11/16 1406  GLUCAP 347* 366* 464* 433* 417*    Brief HPI:   [name] is a 55 y.o. female with history of DMT2 with neuropathy, moderate to sever mitral stenosis, COPD-oxygen  dependent, bipolar disorder, chronic back pain, HTN, diastolic HF with non-compliance with diuretics who was admitted on 12/22/15 with worsening of DOE and increase in BLE edema with blistering. She was started on IV diuresis for acute on chronic CHF and developed ARDS requiring intubation from 04/01. She was started on IV antibiotics for aspiration PNA due to vomiting during intubation. She was placed on heavy sedation and hospital course significant for metabolic encephalopathy. She has been treated with IV diuresis as well as steroids for AECOPD. Neurology felt encephalopathy was to be due to sedation as well as hypothyroid state. She tolerated vent wean and mentation has improved with extubation on 04/12. Swallow evaluation post extubation with signs of acute reversible dysphagia and patient kept NPO till 04/16. She was started on dysphagia 3, thin liquids as mentation improve with safe swallow strategies. Acute renal failure with hypernatremia is resolving and ABLA is stable. Weights being monitored daily and upward trend and she was treated with dose of IV diuresis today and lasix increased to 40 mg daily. She has had issues with delirium in the evening. Therapy ongoing and patient limited by body habitus, anxiety, lacks awareness of deficits as well debility. CIR was  follow up therapy.     Hospital Course: LOUVINA CLEARY was admitted to rehab 01/09/2016 for inpatient therapies to consist  of PT, ST and OT at least three hours five days a week. Past admission physiatrist, therapy team and rehab RN have worked together to provide customized collaborative inpatient rehab. Patient continued to have complaints of SOB with dyspnea and ABG at admission showed hypercarbic due to hypercarbic respiratory failure.  Oxygen was decreased to 2 liters per [place] and oxygen saturation has been in the 90s. She continued to have bouts of lethargy and requires much encouragement to stay out of bed.  She was found to have  urinary retention with overflow incontinence and volumes ranging from 350-500 cc.  Po intake has been variable due to mentation.  CXR prior to admission showed mild overload and weight was 103 kg. Daily weight were monitored and is 101.9 Kg.  Blood sugars have been poorly controlled and she was noted to have leucocytosis on am of 4/20. UA/UCS was ordered for work up. She did developed hypoxia with decrease in MS later that afternoon and was found to be in congestive heart failure. She was placed on NRB and diuresed with 40 mg IV lasix.  [facility] hospitalist were consulted to transfer patient back to acute for closer monitoring and aggressive diuresis. She was discharged to acute on 01/11/16   Disposition: Step down unit   Diet: NPO till mentation improves    Scheduled Meds: . ARIPiprazole  5 mg Oral Daily  . aspirin  81 mg Oral Daily  . divalproex  250 mg Oral Q12H  . enoxaparin (LOVENOX) injection  40 mg Subcutaneous Q24H  . feeding supplement (GLUCERNA SHAKE)  237 mL Oral TID WC  . [START ON 01/12/2016] furosemide  40 mg Oral Daily  . guaiFENesin  600 mg Oral BID  . insulin aspart  0-9 Units Subcutaneous TID WC  . insulin glargine  25 Units Subcutaneous QHS  . ipratropium-albuterol  3 mL Nebulization Q6H  . levothyroxine  125 mcg Oral QAC breakfast  . metoprolol tartrate  25 mg Oral BID  . multivitamin with minerals  1 tablet Oral Daily  . pantoprazole  40 mg Oral Q breakfast  . predniSONE  5 mg Oral Q breakfast  . simvastatin  10 mg Oral q1800  . sodium chloride flush  10-40 mL Intracatheter Q12H  . sodium chloride flush  10-40 mL Intracatheter Q12H    Continuous Infusions:  PRN Meds:.acetaminophen (TYLENOL) oral liquid 160 mg/5 mL, alum & mag hydroxide-simeth, bisacodyl, guaiFENesin-dextromethorphan, ipratropium-albuterol, ondansetron (ZOFRAN) IV, prochlorperazine **OR** prochlorperazine **OR** prochlorperazine, senna-docusate, sodium chloride flush, sodium phosphate     Signed: Jacquelynn CreeLove, Jahmeer Porche S 01/11/2016, 3:12 PM

## 2016-01-11 NOTE — Progress Notes (Signed)
Pt sat 82% on 5L [place], placed pt on VM then NRB, sat 95%.

## 2016-01-11 NOTE — Progress Notes (Signed)
Social Work Patient ID: Evaristo BuryMary J Bogacz, female   DOB: 1961/01/26, 55 y.o.   MRN: [id number] According to [facility] pt being transferred to acute due to respiratory issues. Pt has only been on rehab for two days, plan was to go home with significant other. Admission Coordinator to follow up with on acute.
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
## 2016-01-11 NOTE — Progress Notes (Signed)
1420 01/11/16 nursing RN was called to room re: patient not responding well.  Vital signs 97.3 83 151/85 84% at 2L. Dan PA made aware of vital signs. Respiratory therapist in room giving breathing treatment claims with venti mask o2 sat 82%. Rapid response called. Lasix IV given by Rapid response RN. Patient connected to heart monitor. ABG done by RT. EKG ordered. Foley catheter ordered. Patient on nonrebreather mask sats 100%.Waiting for admitting doctor per PA. Foley catheter placed with good output. Patient's husband in room and aware of patients' transfer. RN to give report to receiving RN.

## 2016-01-12 ENCOUNTER — Inpatient Hospital Stay (HOSPITAL_COMMUNITY): Payer: Medicaid Other

## 2016-01-12 ENCOUNTER — Inpatient Hospital Stay (HOSPITAL_COMMUNITY): Payer: Self-pay | Admitting: Speech Pathology

## 2016-01-12 DIAGNOSIS — J9600 Acute respiratory failure, unspecified whether with hypoxia or hypercapnia: Secondary | ICD-10-CM

## 2016-01-12 DIAGNOSIS — J9601 Acute respiratory failure with hypoxia: Secondary | ICD-10-CM

## 2016-01-12 DIAGNOSIS — J9620 Acute and chronic respiratory failure, unspecified whether with hypoxia or hypercapnia: Secondary | ICD-10-CM

## 2016-01-12 DIAGNOSIS — J9621 Acute and chronic respiratory failure with hypoxia: Principal | ICD-10-CM

## 2016-01-12 DIAGNOSIS — I5033 Acute on chronic diastolic (congestive) heart failure: Secondary | ICD-10-CM

## 2016-01-12 LAB — POCT I-STAT 3, ART BLOOD GAS (G3+)
ACID-BASE EXCESS: 24 mmol/L — AB (ref 0.0–2.0)
Bicarbonate: 48.6 mEq/L — ABNORMAL HIGH (ref 20.0–24.0)
O2 Saturation: 100 %
PCO2 ART: 53.5 mmHg — AB (ref 35.0–45.0)
PH ART: 7.567 — AB (ref 7.350–7.450)
PO2 ART: 227 mmHg — AB (ref 80.0–100.0)
Patient temperature: 98.6
TCO2: >50 mmol/L (ref 0–100)

## 2016-01-12 LAB — COMPREHENSIVE METABOLIC PANEL
ALBUMIN: 2.6 g/dL — AB (ref 3.5–5.0)
ALK PHOS: 72 U/L (ref 38–126)
ALT: 13 U/L — ABNORMAL LOW (ref 14–54)
ANION GAP: 14 (ref 5–15)
AST: 11 U/L — ABNORMAL LOW (ref 15–41)
BUN: 9 mg/dL (ref 6–20)
CALCIUM: 9 mg/dL (ref 8.9–10.3)
CO2: 41 mmol/L — AB (ref 22–32)
Chloride: 90 mmol/L — ABNORMAL LOW (ref 101–111)
Creatinine, Ser: 0.68 mg/dL (ref 0.44–1.00)
GFR calc non Af Amer: >60 mL/min (ref >60)
Glucose, Bld: 134 mg/dL — ABNORMAL HIGH (ref 65–99)
POTASSIUM: 3.2 mmol/L — AB (ref 3.5–5.1)
SODIUM: 145 mmol/L (ref 135–145)
TOTAL PROTEIN: 5.7 g/dL — AB (ref 6.5–8.1)
Total Bilirubin: 1.6 mg/dL — ABNORMAL HIGH (ref 0.3–1.2)

## 2016-01-12 LAB — GLUCOSE, CAPILLARY
GLUCOSE-CAPILLARY: 298 mg/dL — AB (ref 65–99)
GLUCOSE-CAPILLARY: 247 mg/dL — AB (ref 65–99)
Glucose-Capillary: 263 mg/dL — ABNORMAL HIGH (ref 65–99)
Glucose-Capillary: 244 mg/dL — ABNORMAL HIGH (ref 65–99)

## 2016-01-12 LAB — CBC
HEMATOCRIT: 29.6 % — AB (ref 36.0–46.0)
HEMOGLOBIN: 8.8 g/dL — AB (ref 12.0–15.0)
MCH: 24.2 pg — AB (ref 26.0–34.0)
MCHC: 29.7 g/dL — ABNORMAL LOW (ref 30.0–36.0)
MCV: 81.5 fL (ref 78.0–100.0)
Platelets: 244 10*3/uL (ref 150–400)
RBC: 3.63 MIL/uL — AB (ref 3.87–5.11)
RDW: 17.5 % — ABNORMAL HIGH (ref 11.5–15.5)
WBC: 9.9 10*3/uL (ref 4.0–10.5)

## 2016-01-12 LAB — PHOSPHORUS: PHOSPHORUS: 2.4 mg/dL — AB (ref 2.5–4.6)

## 2016-01-12 LAB — HEMOGLOBIN A1C
HEMOGLOBIN A1C: 7.5 % — AB (ref 4.8–5.6)
MEAN PLASMA GLUCOSE: 169 mg/dL

## 2016-01-12 LAB — MAGNESIUM: Magnesium: 1.5 mg/dL — ABNORMAL LOW (ref 1.7–2.4)

## 2016-01-12 LAB — BRAIN NATRIURETIC PEPTIDE: B NATRIURETIC PEPTIDE 5: 821.4 pg/mL — AB (ref 0.0–100.0)

## 2016-01-12 LAB — TROPONIN I: Troponin I: 0.08 ng/mL — ABNORMAL HIGH (ref <0.031)

## 2016-01-12 MED ORDER — PROPOFOL 1000 MG/100ML IV EMUL
INTRAVENOUS | Status: AC
  Filled 2016-01-12: qty 100

## 2016-01-12 MED ORDER — FENTANYL CITRATE (PF) 100 MCG/2ML IJ SOLN
INTRAMUSCULAR | Status: AC
  Administered 2016-01-12: 100 ug
  Filled 2016-01-12: qty 4

## 2016-01-12 MED ORDER — POTASSIUM CHLORIDE 10 MEQ/100ML IV SOLN
10.0000 meq | INTRAVENOUS | Status: AC
  Administered 2016-01-12 (×3): 10 meq via INTRAVENOUS
  Filled 2016-01-12 (×4): qty 100

## 2016-01-12 MED ORDER — PRO-STAT SUGAR FREE PO LIQD
60.0000 mL | Freq: Three times a day (TID) | ORAL | Status: DC
  Administered 2016-01-12 – 2016-01-15 (×8): 60 mL
  Filled 2016-01-12 (×8): qty 60

## 2016-01-12 MED ORDER — FENTANYL CITRATE (PF) 100 MCG/2ML IJ SOLN
50.0000 ug | INTRAMUSCULAR | Status: DC | PRN
  Filled 2016-01-12: qty 2

## 2016-01-12 MED ORDER — VALPROATE SODIUM 250 MG/5ML PO SYRP
250.0000 mg | ORAL_SOLUTION | Freq: Two times a day (BID) | ORAL | Status: DC
  Administered 2016-01-12 – 2016-01-13 (×3): 250 mg
  Filled 2016-01-12 (×3): qty 5

## 2016-01-12 MED ORDER — MIDAZOLAM HCL 2 MG/2ML IJ SOLN
INTRAMUSCULAR | Status: AC
  Filled 2016-01-12: qty 2

## 2016-01-12 MED ORDER — VITAL HIGH PROTEIN PO LIQD: 1000.0000 mL | ORAL | Status: DC

## 2016-01-12 MED ORDER — ETOMIDATE 2 MG/ML IV SOLN: 15.0000 mg | Freq: Once | INTRAVENOUS | Status: DC

## 2016-01-12 MED ORDER — MIDAZOLAM HCL 2 MG/2ML IJ SOLN
INTRAMUSCULAR | Status: AC
  Administered 2016-01-12: 2 mg
  Filled 2016-01-12: qty 4

## 2016-01-12 MED ORDER — CHLORHEXIDINE GLUCONATE 0.12% ORAL RINSE (MEDLINE KIT)
15.0000 mL | Freq: Two times a day (BID) | OROMUCOSAL | Status: DC
  Administered 2016-01-12 – 2016-01-18 (×12): 15 mL via OROMUCOSAL

## 2016-01-12 MED ORDER — PROPOFOL 1000 MG/100ML IV EMUL
5.0000 ug/kg/min | INTRAVENOUS | Status: DC
  Administered 2016-01-12: 10:00:00 via INTRAVENOUS
  Administered 2016-01-12: 60 ug/kg/min via INTRAVENOUS
  Administered 2016-01-12: 25 ug/kg/min via INTRAVENOUS
  Administered 2016-01-13 (×3): 30 ug/kg/min via INTRAVENOUS
  Administered 2016-01-13: 29.979 ug/kg/min via INTRAVENOUS
  Administered 2016-01-14 (×3): 30 ug/kg/min via INTRAVENOUS
  Filled 2016-01-12 (×10): qty 100

## 2016-01-12 MED ORDER — INSULIN ASPART 100 UNIT/ML ~~LOC~~ SOLN
2.0000 [IU] | SUBCUTANEOUS | Status: DC
  Administered 2016-01-12: 6 [IU] via SUBCUTANEOUS
  Administered 2016-01-13: 4 [IU] via SUBCUTANEOUS
  Administered 2016-01-13: 10 [IU] via SUBCUTANEOUS
  Administered 2016-01-13: 4 [IU] via SUBCUTANEOUS
  Administered 2016-01-13 (×2): 6 [IU] via SUBCUTANEOUS
  Administered 2016-01-13: 4 [IU] via SUBCUTANEOUS
  Administered 2016-01-14: 6 [IU] via SUBCUTANEOUS

## 2016-01-12 MED ORDER — VITAL HIGH PROTEIN PO LIQD
1000.0000 mL | ORAL | Status: DC
  Administered 2016-01-13 – 2016-01-14 (×2): 1000 mL

## 2016-01-12 MED ORDER — PRO-STAT SUGAR FREE PO LIQD
30.0000 mL | Freq: Two times a day (BID) | ORAL | Status: DC
  Filled 2016-01-12 (×2): qty 30

## 2016-01-12 MED ORDER — FENTANYL CITRATE (PF) 100 MCG/2ML IJ SOLN
100.0000 ug | Freq: Once | INTRAMUSCULAR | Status: AC
  Administered 2016-01-12: 100 ug via INTRAVENOUS

## 2016-01-12 MED ORDER — FENTANYL CITRATE (PF) 100 MCG/2ML IJ SOLN: 50.0000 ug | INTRAMUSCULAR | Status: DC | PRN

## 2016-01-12 MED ORDER — MIDAZOLAM HCL 2 MG/2ML IJ SOLN
2.0000 mg | Freq: Once | INTRAMUSCULAR | Status: AC
  Administered 2016-01-12: 2 mg via INTRAVENOUS

## 2016-01-12 MED ORDER — ANTISEPTIC ORAL RINSE SOLUTION (CORINZ)
7.0000 mL | Freq: Four times a day (QID) | OROMUCOSAL | Status: DC
  Administered 2016-01-13 – 2016-01-18 (×21): 7 mL via OROMUCOSAL

## 2016-01-12 MED ORDER — FENTANYL CITRATE (PF) 100 MCG/2ML IJ SOLN
INTRAMUSCULAR | Status: AC
  Administered 2016-01-12: 100 ug
  Filled 2016-01-12: qty 2

## 2016-01-12 MED ORDER — ALPRAZOLAM 0.5 MG PO TABS
0.5000 mg | ORAL_TABLET | Freq: Once | ORAL | Status: AC
  Administered 2016-01-12: 0.5 mg via ORAL

## 2016-01-12 NOTE — Progress Notes (Signed)
Inpatient Diabetes Program Recommendations  AACE/ADA: New Consensus Statement on Inpatient Glycemic Control (2015)  Target Ranges:  Prepandial:   less than 140 mg/dL      Peak postprandial:   less than 180 mg/dL (1-2 hours)      Critically ill patients:  140 - 180 mg/dL   Review of Glycemic Control: Results for [name], [name] (MRN [id number]) as of 01/12/2016 11:18  Ref. Range 01/11/2016 13:07 01/11/2016 14:06 01/11/2016 16:32 01/11/2016 22:20 01/12/2016 07:44  Glucose-Capillary Latest Ref Range: 65-99 mg/dL 782433 (H) 956417 (H) 213340 (H) 161 (H) 263 (H)   Diabetes history: Diabetes Mellitus-insulin dependent Outpatient Diabetes medications: Lantus 10 units daily, Novolog 6-15 units tid with meals Current orders for Inpatient glycemic control:  Lantus 10 units daily, Novolog moderate tid with meals and HS  Inpatient Diabetes Program Recommendations:    Note events.  May consider ICU protocol and Standard correction. Continue Lantus 10 units daily.  Thanks, Beryl MeagerJenny Gurdeep Keesey, RN, BC-ADM Inpatient Diabetes Coordinator Pager [phone] (8a-5p)

## 2016-01-12 NOTE — Progress Notes (Signed)
Placed back on BIPAP due to pt on VM came up to 90% briefly then desat to 85%. RN also administered Lasix.

## 2016-01-12 NOTE — Progress Notes (Signed)
  [facility] TEAM 1 - Stepdown/ICU TEAM  Called to bedside by RN who was having trouble keeping the patient's saturations >90 despite BIPAP.  On exam the pt is heavily sedate.  Her respiratory effort is minimal.  [name] clearly in distress, and in need of emergent intubation. I have emergently consulted PCCM who is at the bedside to intubate.  [facility] will sign off, as PCCM is assuming the attending role.    Lonia BloodJeffrey T. Lashawndra Lampkins, MD [facility] Office  [phone] Pager - Text Page per Amion as per below:  On-Call/Text Page:      [email or web address]      password [email or web address]  If 7PM-7AM, please contact night-coverage [email or web address] Password [email or web address] 01/12/2016, 10:04 AM

## 2016-01-12 NOTE — Progress Notes (Signed)
Patient became anxious and C/O SOB with increased BPs. [facility] notified and orders received.  Will continue to monitor.

## 2016-01-12 NOTE — Progress Notes (Signed)
Patient resting comfortably on nasal cannula.  Bipap not needed at this time.  Will continue to monitor.  

## 2016-01-12 NOTE — Progress Notes (Signed)
Sputum sample obtained and sent down to main lab without any complications.  

## 2016-01-12 NOTE — Progress Notes (Signed)
Initial Nutrition Assessment  DOCUMENTATION CODES:   Morbid obesity  INTERVENTION:   Initiate Vital High Protein @ 10 ml/hr via OGT  60 ml Prostat TID.    Tube feeding regimen provides 840 kcal (87% of needs), 111 grams of protein, and 201 ml of H2O.   Regimen will provide total of 1104 kcals (100% of estimated kcal needs) with current rate of propofol.  NUTRITION DIAGNOSIS:   Inadequate oral intake related to inability to eat as evidenced by NPO status.  GOAL:   Provide needs based on ASPEN/SCCM guidelines  MONITOR:   Vent status, Labs, Weight trends, TF tolerance, Skin, I & O's  REASON FOR ASSESSMENT:   Consult, Ventilator Enteral/tube feeding initiation and management  ASSESSMENT:   55 yo woman with history of CHF and COPD who came in with respiratory distress and was emergently intubated on 4/21 due to difficulty maintaining O2 sats above 90 while on biPap.  Pt admitted to hospital from inpatient rehab secondary to respiratory distress.   Patient is currently intubated on ventilator support MV: 6.0 L/min Temp (24hrs), Avg:98.5 F (36.9 C), Min:97.3 F (36.3 C), Max:99.4 F (37.4 C)  Propofol: 10 ml/hr (264 kcals)  Nutrition-Focused physical exam completed. Findings are no fat depletion, no muscle depletion, and mild edema.   Labs reviewed: CBGS: 298.   Diet Order:  Diet NPO time specified  Skin:  Wound (see comment) (stage II sacrum, MASD buttocks)  Last BM:  PTA  Height:   Ht Readings from Last 1 Encounters:  01/09/16 4\' 9"  (1.448 m)    Weight:   Wt Readings from Last 1 Encounters:  01/12/16 206 lb (93.441 kg)    Ideal Body Weight:  43.8 kg  BMI:  Body mass index is 44.57 kg/(m^2).  Estimated Nutritional Needs:   Kcal:  [phone]  Protein:  >110 grams  Fluid:  >1 L  EDUCATION NEEDS:   No education needs identified at this time  Kalimah Capurro A. Mayford KnifeWilliams, RD, LDN, CDE Pager: [phone] After hours Pager: [phone]

## 2016-01-12 NOTE — H&P (Signed)
PULMONARY / CRITICAL CARE MEDICINE   Name: [name] MRN: [id number] DOB: 1960/12/18    ADMISSION DATE:  01/11/2016 CONSULTATION DATE:  4/21  REFERRING MD:  Dolphus JennyMclung, MD  CHIEF COMPLAINT:  Acute resp failure  HISTORY OF PRESENT ILLNESS:   This is a 55 yo F with COPD, CHF, diabetes, mitral stenosis secondary to rheumatic fever history, diabetes, bipolar, GERD, seizures, left shoulder fracture, who came in with acute restorative failure and acute encephalopathy.  Patient was recently admitted on 4/1 for AoCHF and COPDex. She had to be intubated, as she decompensated from respiratory standpoint as it was thought to be due to ARDS secondary to aspiration. She improved slowly and was transferred to to inpt rehab on 4/18.  She presented this time with acute respiratory failure secondary to acute diastolic congestive heart failure with last echo showing EF of 60% with grade 2 diastolic dysfunction. [name] on home lasix 40 mg BID , but was only taking 40 mg daily while on inpt rehab, so likely this contributed to the exacerbation. She was started on lasix, and on 4/21, rn was having difficulty keeping the patient's sats above 90 despite Bipap. Pt had to be emergently intubated, and PCCM was asked to take over.   PAST MEDICAL HISTORY :  She  has a past medical history of Hypertension; Anemia; Chronic back pain; Neuropathy (HCC); Hypercholesterolemia; Major depressive disorder, recurrent (HCC); Generalized anxiety disorder; Panic disorder with agoraphobia; Tobacco abuse; MI (myocardial infarction) (HCC) (2007); Type II diabetes mellitus (HCC); Heart murmur; Emphysema of lung (HCC); Pneumonia (1960's X 2); On home oxygen therapy; Thin blood (HCC); History of blood transfusion; GERD (gastroesophageal reflux disease); Arthritis; Bipolar disorder (HCC); Rheumatic fever; Mitral stenosis; COPD (chronic obstructive pulmonary disease) (HCC); Hypothyroidism; Chronic back pain; PONV (postoperative nausea and  vomiting); Seizures (HCC); CHF (congestive heart failure) (HCC); Fracture of right humerus (04/13/2015); Left patella fracture (04/12/2015); Shoulder fracture, right (04/12/2015); and Shortness of breath dyspnea.  PAST SURGICAL HISTORY: She  has past surgical history that includes Foot surgery (Bilateral); Back surgery (X 3); Shoulder surgery (Left); Patella fracture surgery (Left, ~ 1999); Colon surgery; Tonsillectomy and adenoidectomy; Inguinal hernia repair (Right); Knee surgery (Left, ~ 2011); ORIF ankle fracture (Left); Tubal ligation; and ORIF humerus fracture (Right, 10/12/2015).  Allergies  Allergen Reactions  . Trazodone And Nefazodone Anaphylaxis and Other (See Comments)    Shortness of breath.  . Celecoxib Nausea Only  . Ibuprofen Nausea Only    No current facility-administered medications on file prior to encounter.   Current Outpatient Prescriptions on File Prior to Encounter  Medication Sig  . acetaminophen (TYLENOL) 500 MG tablet Take 500 mg by mouth daily.  Marland Kitchen. albuterol (PROAIR HFA) 108 (90 BASE) MCG/ACT inhaler Inhale 2 puffs into the lungs every 6 (six) hours. scheduled  . ALPRAZolam (XANAX) 0.5 MG tablet Take 1 tablet (0.5 mg total) by mouth at bedtime as needed for anxiety. (Patient taking differently: Take 0.5 mg by mouth 2 (two) times daily. )  . ARIPiprazole (ABILIFY) 5 MG tablet Take 5 mg by mouth daily.  Marland Kitchen. aspirin EC 81 MG tablet Take 1 tablet (81 mg total) by mouth daily.  . cetirizine (ZYRTEC) 10 MG tablet Take 10 mg by mouth daily.   . divalproex (DEPAKOTE) 250 MG DR tablet Take 1 tablet (250 mg total) by mouth every 12 (twelve) hours.  . furosemide (LASIX) 40 MG tablet Take 1 tablet (40 mg total) by mouth 2 (two) times daily.  Marland Kitchen. guaiFENesin (MUCINEX) 600  MG 12 hr tablet Take 1 tablet (600 mg total) by mouth 2 (two) times daily.  Marland Kitchen guaiFENesin-dextromethorphan (ROBITUSSIN DM) 100-10 MG/5ML syrup Take 5 mLs by mouth every 4 (four) hours as needed for cough.  . insulin  aspart (NOVOLOG FLEXPEN) 100 UNIT/ML FlexPen Inject 6-15 Units into the skin 3 (three) times daily as needed for high blood sugar (CBG >100). CBG 100-200 6-8 units, >200 15 units  . insulin glargine (LANTUS) 100 UNIT/ML injection Inject 0.1 mLs (10 Units total) into the skin daily.  Marland Kitchen ipratropium-albuterol (DUONEB) 0.5-2.5 (3) MG/3ML SOLN Take 3 mLs by nebulization every 2 (two) hours as needed.  Marland Kitchen ipratropium-albuterol (DUONEB) 0.5-2.5 (3) MG/3ML SOLN Take 3 mLs by nebulization every 6 (six) hours.  Marland Kitchen levothyroxine (SYNTHROID, LEVOTHROID) 125 MCG tablet Take 1 tablet (125 mcg total) by mouth daily before breakfast.  . losartan (COZAAR) 100 MG tablet Take 1 tablet (100 mg total) by mouth daily.  . metoprolol tartrate (LOPRESSOR) 25 MG tablet Take 0.5 tablets (12.5 mg total) by mouth 2 (two) times daily.  Marland Kitchen omeprazole (PRILOSEC) 20 MG capsule Take 20 mg by mouth daily.  . OXYGEN Inhale into the lungs continuous. 3L  . potassium chloride SA (K-DUR,KLOR-CON) 20 MEQ tablet Take 1 tablet (20 mEq total) by mouth daily.  . simvastatin (ZOCOR) 10 MG tablet Take 10 mg by mouth daily.     FAMILY HISTORY:  Her indicated that her mother is deceased. She indicated that her father is deceased.   SOCIAL HISTORY: She  reports that she quit smoking about 3 years ago. Her smoking use included Cigarettes. She has a 20 pack-year smoking history. She has never used smokeless tobacco. She reports that she drinks alcohol. She reports that she uses illicit drugs ("Crack" cocaine).  REVIEW OF SYSTEMS:   Pt sedated so unable to obtain    VITAL SIGNS: BP 63/39 mmHg  Pulse 77  Temp(Src) 98.7 F (37.1 C) (Oral)  Resp 13  Wt 206 lb (93.441 kg)  SpO2 100%  HEMODYNAMICS:    VENTILATOR SETTINGS: Vent Mode:  [-] PRVC FiO2 (%):  [40 %-100 %] 40 % Set Rate:  [12 bmp-18 bmp] 12 bmp Vt Set:  [430 mL-490 mL] 430 mL PEEP:  [10 cmH20] 10 cmH20 Plateau Pressure:  [26 cmH20-33 cmH20] 26 cmH20  INTAKE / OUTPUT: I/O  last 3 completed shifts: In: -  Out: 3850 [Urine:3850]  PHYSICAL EXAMINATION: General:  Lethargic, severe disterss Neuro:  Alert, letharghic, nonfocal HEENT:  jvd noted Cardiovascular:  s1 s2 RRT murm Lungs:  Crackles, bilateral Abdomen:  Soft, obese, NT, ND Musculoskeletal:  Edema 2 plus Skin:  No rash  LABS:  BMET  Recent Labs Lab 01/10/16 0812 01/11/16 1843 01/12/16 0258  NA 142 141 145  K 4.0 3.6 3.2*  CL 94* 88* 90*  CO2 34* 40* 41*  BUN CREATININE 0.62 0.74 0.68  GLUCOSE 219* 299* 134*    Electrolytes  Recent Labs Lab 01/10/16 0812 01/11/16 1843 01/12/16 0258  CALCIUM 9.5 9.5 9.0  MG  --  1.7  --   PHOS  --  3.2  --     CBC  Recent Labs Lab 01/10/16 0812 01/11/16 1843 01/12/16 0258  WBC 10.6* 10.6* 9.9  HGB 9.4* 9.1* 8.8*  HCT 32.4* 31.2* 29.6*  PLT 248 250 244    Coag's No results for input(s): APTT, INR in the last 168 hours.  Sepsis Markers No results for input(s): LATICACIDVEN, PROCALCITON, O2SATVEN in the  last 168 hours.  ABG  Recent Labs Lab 01/11/16 1440 01/11/16 2030 01/12/16 1117  PHART 7.376 7.502* 7.567*  PCO2ART 71.5* 61.0* 53.5*  PO2ART 169* 68.9* 227.0*    Liver Enzymes  Recent Labs Lab 01/10/16 0812 01/11/16 1843 01/12/16 0258  AST 15 14* 11*  ALT 16 17 13*  ALKPHOS 81 86 72  BILITOT 1.3* 1.4* 1.6*  ALBUMIN 3.4* 3.2* 2.6*    Cardiac Enzymes No results for input(s): TROPONINI, PROBNP in the last 168 hours.  Glucose  Recent Labs Lab 01/11/16 1149 01/11/16 1307 01/11/16 1406 01/11/16 1632 01/11/16 2220 01/12/16 0744  GLUCAP 464* 433* 417* 340* 161* 263*    Imaging Dg Chest Port 1 View  01/12/2016  CLINICAL DATA:  Respiratory distress, recent intubation EXAM: PORTABLE CHEST 1 VIEW COMPARISON:  01/12/2016 FINDINGS: Cardiac shadow is stable. Bibasilar densities are again seen and stable. Mild vascular congestion is again noted. A nasogastric catheter is now noted within the stomach. An  endotracheal tube is seen approximately 3.8 cm above the carina. Postsurgical changes are noted in both shoulders and lower cervical spine. No acute bony abnormality is seen. IMPRESSION: Stable appearance of the chest. The endotracheal tube and nasogastric catheter are in satisfactory position. Electronically Signed   By: Alcide Clever M.D.   On: 01/12/2016 10:43   Dg Chest Port 1 View  01/12/2016  CLINICAL DATA:  Acute renal failure ; congestive heart failure EXAM: PORTABLE CHEST 1 VIEW COMPARISON:  January 11, 2016 FINDINGS: There is interstitial edema throughout the lungs with patchy alveolar edema in the bases. There is cardiomegaly with pulmonary venous hypertension. There is a equivocal small pleural effusions bilaterally. There is postoperative change in the proximal right humerus. There is a total shoulder replacement on the left. There is postoperative change in the lower cervical spine. IMPRESSION: Congestive heart failure persists. Superimposed pneumonia in the bases cannot be excluded radiographically. Appearance is quite similar to 1 day prior. Electronically Signed   By: Bretta Bang III M.D.   On: 01/12/2016 07:29   Dg Chest Port 1 View  01/11/2016  CLINICAL DATA:  Shortness of breath today.  Initial encounter. EXAM: PORTABLE CHEST 1 VIEW COMPARISON:  PA and lateral chest 01/08/2016 and 01/06/2016. FINDINGS: Cardiomegaly and pulmonary edema are seen. There are likely bilateral pleural effusions. Aeration is markedly worsened compared to the prior study. No pneumothorax is identified. Left shoulder replacement is noted. The patient is status post fixation of a proximal humerus fracture and cervical fusion. IMPRESSION: Marked worsening in aeration most consistent with congestive heart failure. Electronically Signed   By: Drusilla Kanner M.D.   On: 01/11/2016 15:11     STUDIES:  CXR 4/21> CXR 4/20>   CULTURES: Resp culture 4/21 > Ucx 4/21 >  ANTIBIOTICS:   SIGNIFICANT  EVENTS: ETT 4/21>  LINES/TUBES: ETT 4/21>  DISCUSSION:  55 yo woman with history of CHF and COPD who came in with respiratory distress and was emergently intubated on 4/21 due to difficulty maintaining O2 sats above 90 while on biPap.  ASSESSMENT / PLAN:  PULMONARY A: Acute hypoxic and hypercarbic respiratory failure secondary to CHF exacerbation  COPD  P:   -continue mechanical ventilation, wean as tolerated  -CVP monitoring consideration -sedation with propofol (decreased due to soft BP) and fentanyl -duonebs -morphine 2 mg PRN for pain  Stat abg -peep 10 needed  CARDIOVASCULAR A:  HTn Mitral stenosis CHF with grade 2 diastolic dysfx- last echo 4/4 with EF 60-65% with grade 2 diastolic  dysfunction   Diuresing well on lasix. Net output 4 L 4/20 to today over 24 hours  -Most recent BP 63/39 1:30 PM  P:  -continue mechanical ventilation -lasix 80 mg BID IV - holding metoprolol, losartan,  -strict I & O Trop, ecg  RENAL A:   Creatinine at baseline Hypokalemia - K of 3.2 pulm edema P:   -lasix increase -repleted K  GASTROINTESTINAL A:   GERD P:   PPI NPO for now- start TF after intubation  HEMATOLOGIC A:   Anaemia DVT prev  P:  Sub q hep Cbc in am , follow wbc  INFECTIOUS A:  No infection noted  P:   Follow up cultures  ENDOCRINE A:   Hypothyroidism T2Dm, most recent a1c of 6.7  HLD  P:   -Lantus 10 units daily with TF -SSI -monitor CBGs -synthroid  NEUROLOGIC A:   Bipolar History of seizures- no reported seizure  depression  P:   RASS goal: 0 to -1 Continue depakote, consider level  Xanax abilify fent addition propofol  FAMILY  - Updates: none  - Inter-disciplinary family meet or Palliative Care meeting due by:  4/28    Pulmonary and Critical Care Medicine [facility] Pager: [phone]  01/12/2016, 12:26 PM   STAFF NOTE: I, Rory Percy, MD FACP have personally reviewed patient's available  data, including medical history, events of note, physical examination and test results as part of my evaluation. I have discussed with resident/NP and other care providers such as pharmacist, RN and RRT. In addition, I personally evaluated patient and elicited key findings of: distress severe, dyschrony on NIMV, required emergent intubation, STAT abg revealed, reduce rate / TV, pcxr c/w likely edema, lasix increase has been effective this am , would assess trop, bnp , ecg, likely overload, stat pcxr for ett, chem in am , k supp, control HR with d CHF, prop if bp allows, conasider cvp and line, to note with MS valve rt side and pa has been wnl, rass goal -1, if spike add hcap coverage The patient is critically ill with multiple organ systems failure and requires high complexity decision making for assessment and support, frequent evaluation and titration of therapies, application of advanced monitoring technologies and extensive interpretation of multiple databases.   Critical Care Time devoted to patient care services described in this note is50 Minutes. This time reflects time of care of this signee: Rory Percy, MD FACP. This critical care time does not reflect procedure time, or teaching time or supervisory time of PA/NP/Med student/Med Resident etc but could involve care discussion time. Rest per NP/medical resident whose note is outlined above and that I agree with   Mcarthur Rossetti. Tyson Alias, MD, FACP Pgr: [phone]  Pulmonary & Critical Care 01/12/2016 2:12 PM

## 2016-01-12 NOTE — Procedures (Signed)
Intubation Procedure Note Evaristo BuryMary J Cocker [id number] 1961/09/10  Procedure: Intubation Indications: Respiratory insufficiency  Procedure Details Consent: Unable to obtain consent because of emergent medical necessity. Time Out: Verified patient identification, verified procedure, site/side was marked, verified correct patient position, special equipment/implants available, medications/allergies/relevent history reviewed, required imaging and test results available.  Performed  Maximum sterile technique was used including gloves, gown, hand hygiene and mask.  MAC and 3    Evaluation Hemodynamic Status: BP stable throughout; O2 sats: stable throughout Patient's Current Condition: stable Complications: No apparent complications Patient did tolerate procedure well. Chest X-ray ordered to verify placement.  CXR: pending.   Nelda BucksFEINSTEIN,Deaun Rocha J. 01/12/2016  [place]  Mcarthur Rossettianiel J. Tyson AliasFeinstein, MD, FACP Pgr: [phone] [place] [facility]

## 2016-01-12 NOTE — Procedures (Signed)
Central Venous Catheter Insertion Procedure Note Elizabeth BuryMary J [name] [id number] June 02, 1961  Procedure: Insertion of Central Venous Catheter Indications: Assessment of intravascular volume, Drug and/or fluid administration and Frequent blood sampling  Procedure Details Consent: Risks of procedure as well as the alternatives and risks of each were explained to the (patient/caregiver).  Consent for procedure obtained. Time Out: Verified patient identification, verified procedure, site/side was marked, verified correct patient position, special equipment/implants available, medications/allergies/relevent history reviewed, required imaging and test results available.  Performed  Maximum sterile technique was used including antiseptics, cap, gloves, gown, hand hygiene, mask and sheet. Skin prep: Chlorhexidine; local anesthetic administered A antimicrobial bonded/coated triple lumen catheter was placed in the right internal jugular vein using the Seldinger technique.  Evaluation Blood flow good Complications: No apparent complications Patient did tolerate procedure well. Chest X-ray ordered to verify placement.  CXR: pending.  Nelda BucksFEINSTEIN,[name] 01/12/2016, 3:39 PM  US  Mcarthur Rossettianiel J. Tyson AliasFeinstein, MD, FACP Pgr: [phone] [place] [facility]

## 2016-01-13 ENCOUNTER — Inpatient Hospital Stay (HOSPITAL_COMMUNITY): Payer: Medicaid Other

## 2016-01-13 LAB — BASIC METABOLIC PANEL
Anion gap: 16 — ABNORMAL HIGH (ref 5–15)
BUN: 20 mg/dL (ref 6–20)
CALCIUM: 8.7 mg/dL — AB (ref 8.9–10.3)
CHLORIDE: 86 mmol/L — AB (ref 101–111)
CO2: 41 mmol/L — AB (ref 22–32)
CREATININE: 0.92 mg/dL (ref 0.44–1.00)
GFR calc Af Amer: >60 mL/min (ref >60)
GFR calc non Af Amer: >60 mL/min (ref >60)
GLUCOSE: 195 mg/dL — AB (ref 65–99)
Potassium: 2.8 mmol/L — ABNORMAL LOW (ref 3.5–5.1)
Sodium: 143 mmol/L (ref 135–145)

## 2016-01-13 LAB — URINE CULTURE

## 2016-01-13 LAB — GLUCOSE, CAPILLARY
GLUCOSE-CAPILLARY: 160 mg/dL — AB (ref 65–99)
GLUCOSE-CAPILLARY: 185 mg/dL — AB (ref 65–99)
GLUCOSE-CAPILLARY: 224 mg/dL — AB (ref 65–99)
Glucose-Capillary: 170 mg/dL — ABNORMAL HIGH (ref 65–99)
Glucose-Capillary: 306 mg/dL — ABNORMAL HIGH (ref 65–99)
Glucose-Capillary: 216 mg/dL — ABNORMAL HIGH (ref 65–99)

## 2016-01-13 LAB — BLOOD GAS, ARTERIAL
ACID-BASE EXCESS: 16.7 mmol/L — AB (ref 0.0–2.0)
Bicarbonate: 40.7 mEq/L — ABNORMAL HIGH (ref 20.0–24.0)
Drawn by: 280981
FIO2: 0.4
LHR: 12 {breaths}/min
O2 SAT: 95.9 %
PATIENT TEMPERATURE: 98.6
PEEP/CPAP: 5 cmH2O
TCO2: 42.1 mmol/L (ref 0–100)
VT: 430 mL
pCO2 arterial: 44.2 mmHg (ref 35.0–45.0)
pH, Arterial: 7.571 — ABNORMAL HIGH (ref 7.350–7.450)
pO2, Arterial: 79.5 mmHg — ABNORMAL LOW (ref 80.0–100.0)

## 2016-01-13 LAB — PHOSPHORUS
PHOSPHORUS: 2.5 mg/dL (ref 2.5–4.6)
Phosphorus: 1.9 mg/dL — ABNORMAL LOW (ref 2.5–4.6)

## 2016-01-13 LAB — MAGNESIUM
MAGNESIUM: 1.2 mg/dL — AB (ref 1.7–2.4)
Magnesium: 2.4 mg/dL (ref 1.7–2.4)

## 2016-01-13 LAB — TROPONIN I
Troponin I: 0.1 ng/mL — ABNORMAL HIGH (ref <0.031)
Troponin I: 0.09 ng/mL — ABNORMAL HIGH (ref <0.031)

## 2016-01-13 LAB — PROCALCITONIN: PROCALCITONIN: 1.01 ng/mL

## 2016-01-13 LAB — TSH: TSH: 5.46 u[IU]/mL — AB (ref 0.350–4.500)

## 2016-01-13 LAB — LACTIC ACID, PLASMA: Lactic Acid, Venous: 3.4 mmol/L (ref 0.5–2.0)

## 2016-01-13 MED ORDER — SODIUM CHLORIDE 0.9 % IV BOLUS (SEPSIS)
1000.0000 mL | Freq: Once | INTRAVENOUS | Status: AC
  Administered 2016-01-13: 1000 mL via INTRAVENOUS

## 2016-01-13 MED ORDER — SODIUM CHLORIDE 0.9 % IV SOLN
1250.0000 mg | Freq: Two times a day (BID) | INTRAVENOUS | Status: DC
  Administered 2016-01-13 – 2016-01-15 (×4): 1250 mg via INTRAVENOUS
  Filled 2016-01-13 (×6): qty 1250

## 2016-01-13 MED ORDER — PANTOPRAZOLE SODIUM 40 MG PO PACK
40.0000 mg | PACK | Freq: Every day | ORAL | Status: DC
  Administered 2016-01-13 – 2016-01-17 (×5): 40 mg
  Filled 2016-01-13 (×6): qty 20

## 2016-01-13 MED ORDER — VALPROATE SODIUM 250 MG/5ML PO SYRP
500.0000 mg | ORAL_SOLUTION | Freq: Two times a day (BID) | ORAL | Status: DC
  Administered 2016-01-14 – 2016-01-22 (×15): 500 mg
  Filled 2016-01-13 (×21): qty 10

## 2016-01-13 MED ORDER — PIPERACILLIN-TAZOBACTAM 3.375 G IVPB
3.3750 g | Freq: Three times a day (TID) | INTRAVENOUS | Status: DC
  Administered 2016-01-13 – 2016-01-14 (×5): 3.375 g via INTRAVENOUS
  Filled 2016-01-13 (×7): qty 50

## 2016-01-13 MED ORDER — POTASSIUM PHOSPHATES 15 MMOLE/5ML IV SOLN: 30.0000 mmol | Freq: Once | INTRAVENOUS | Status: DC

## 2016-01-13 MED ORDER — ALBUMIN HUMAN 5 % IV SOLN
12.5000 g | Freq: Once | INTRAVENOUS | Status: AC
  Administered 2016-01-13: 12.5 g via INTRAVENOUS
  Filled 2016-01-13: qty 250

## 2016-01-13 MED ORDER — LEVOTHYROXINE SODIUM 125 MCG PO TABS
125.0000 ug | ORAL_TABLET | Freq: Every day | ORAL | Status: DC
  Administered 2016-01-13 – 2016-01-17 (×5): 125 ug
  Filled 2016-01-13 (×5): qty 1

## 2016-01-13 MED ORDER — MAGNESIUM SULFATE 4 GM/100ML IV SOLN
4.0000 g | Freq: Once | INTRAVENOUS | Status: AC
  Administered 2016-01-13: 4 g via INTRAVENOUS
  Filled 2016-01-13: qty 100

## 2016-01-13 MED ORDER — POTASSIUM & SODIUM PHOSPHATES 280-160-250 MG PO PACK
1.0000 | PACK | Freq: Four times a day (QID) | ORAL | Status: AC
  Administered 2016-01-13 (×3): 1
  Filled 2016-01-13 (×3): qty 1

## 2016-01-13 MED ORDER — INSULIN GLARGINE 100 UNIT/ML ~~LOC~~ SOLN
15.0000 [IU] | Freq: Every day | SUBCUTANEOUS | Status: DC
  Administered 2016-01-14 – 2016-01-19 (×6): 15 [IU] via SUBCUTANEOUS
  Filled 2016-01-13 (×6): qty 0.15

## 2016-01-13 MED ORDER — POTASSIUM CHLORIDE 10 MEQ/50ML IV SOLN
10.0000 meq | INTRAVENOUS | Status: AC
  Administered 2016-01-13 (×6): 10 meq via INTRAVENOUS
  Filled 2016-01-13: qty 50

## 2016-01-13 NOTE — Progress Notes (Signed)
eLink Physician-Brief Progress Note Patient Name: Elizabeth BuryMary J [name] DOB: 1961-09-10 MRN: [id number]   Date of Service  01/13/2016  HPI/Events of Note  Lactic Acid = 3.4. Last LVEF = 60%-65%.   eICU Interventions  Will order: 1. 0.9 NaCl 1 liter IV over 1 hour now.      Intervention Category Major Interventions: Acid-Base disturbance - evaluation and management  Jaxsyn Azam Eugene 01/13/2016, 4:30 PM

## 2016-01-13 NOTE — Progress Notes (Addendum)
PULMONARY / CRITICAL CARE MEDICINE   Name: [name] MRN: [id number] DOB: Aug 06, 1961    ADMISSION DATE:  01/11/2016 CONSULTATION DATE:  4/21  REFERRING MD:  Elizabeth JennyMclung, MD    HISTORY OF PRESENT ILLNESS:   This is a 55 yo F with COPD, CHF, diabetes, mitral stenosis secondary to rheumatic fever history, diabetes, bipolar, GERD, seizures, left shoulder fracture, who came in with acute restorative failure and acute encephalopathy.  Patient was recently admitted on 4/1 for AoCHF and COPDex. She had to be intubated, as she decompensated from respiratory standpoint as it was thought to be due to ARDS secondary to aspiration. She improved slowly and was transferred to to inpt rehab on 4/18.  She presented this time with acute respiratory failure secondary to acute diastolic congestive heart failure with last echo showing EF of 60% with grade 2 diastolic dysfunction. [name] on home lasix 40 mg BID , but was only taking 40 mg daily while on inpt rehab, so likely this contributed to the exacerbation. She was started on lasix, and on 4/21, rn was having difficulty keeping the patient's sats above 90 despite Bipap. Pt had to be emergently intubated, and PCCM was asked to take over.   Subjective :    VITAL SIGNS: BP 115/62 mmHg  Pulse 109  Temp(Src) 101.8 F (38.8 C) (Oral)  Resp 16  Wt 207 lb (93.895 kg)  SpO2 98%  HEMODYNAMICS:    VENTILATOR SETTINGS: Vent Mode:  [-] PRVC FiO2 (%):  [40 %] 40 % Set Rate:  [12 bmp] 12 bmp Vt Set:  [430 mL] 430 mL PEEP:  [8 cmH20-10 cmH20] 8 cmH20 Plateau Pressure:  [23 cmH20-29 cmH20] 25 cmH20  INTAKE / OUTPUT: I/O last 3 completed shifts: In: 459.5 [I.V.:303; NG/GT:6.5; IV Piggyback:150] Out: 4940 Nexxus.Spurr[Urine:4615; Emesis/NG output:325]  PHYSICAL EXAMINATION: General:  Lethargic, severe disterss Neuro:  Alert, letharghic, nonfocal HEENT:  jvd noted Cardiovascular:  s1 s2 RRT murm Lungs:  Crackles, bilateral Abdomen:  Soft, obese, NT,  ND Musculoskeletal:  Edema 2 plus Skin:  No rash  LABS:  BMET  Recent Labs Lab 01/11/16 1843 01/12/16 0258 01/13/16 0159  NA 141 145 143  K 3.6 3.2* 2.8*  CL 88* 90* 86*  CO2 40* 41* 41*  BUN 11 9 20   CREATININE 0.74 0.68 0.92  GLUCOSE 299* 134* 195*    Electrolytes  Recent Labs Lab 01/11/16 1843 01/12/16 0258 01/12/16 2029 01/13/16 0159  CALCIUM 9.5 9.0  --  8.7*  MG 1.7  --  1.5* 1.2*  PHOS 3.2  --  2.4* 1.9*    CBC  Recent Labs Lab 01/10/16 0812 01/11/16 1843 01/12/16 0258  WBC 10.6* 10.6* 9.9  HGB 9.4* 9.1* 8.8*  HCT 32.4* 31.2* 29.6*  PLT 248 250 244    Coag's No results for input(s): APTT, INR in the last 168 hours.  Sepsis Markers No results for input(s): LATICACIDVEN, PROCALCITON, O2SATVEN in the last 168 hours.  ABG  Recent Labs Lab 01/11/16 1440 01/11/16 2030 01/12/16 1117  PHART 7.376 7.502* 7.567*  PCO2ART 71.5* 61.0* 53.5*  PO2ART 169* 68.9* 227.0*    Liver Enzymes  Recent Labs Lab 01/10/16 0812 01/11/16 1843 01/12/16 0258  AST 15 14* 11*  ALT 16 17 13*  ALKPHOS 81 86 72  BILITOT 1.3* 1.4* 1.6*  ALBUMIN 3.4* 3.2* 2.6*    Cardiac Enzymes  Recent Labs Lab 01/12/16 2029  TROPONINI 0.08*    Glucose  Recent Labs Lab 01/12/16 1118 01/12/16 1607  01/12/16 1947 01/12/16 2344 01/13/16 0341 01/13/16 0821  GLUCAP 298* 247* 244* 160* 185* 170*    Imaging Dg Chest Port 1 View  01/12/2016  CLINICAL DATA:  Encounter for central line placement EXAM: PORTABLE CHEST 1 VIEW COMPARISON:  Portable exam 1628 hours compared to 01/12/2016 FINDINGS: Tip of endotracheal tube projects 5.6 cm above carina. Nasogastric tube extends into stomach. New RIGHT jugular central venous catheter with tip projecting over SVC. Enlargement of cardiac silhouette with pulmonary vascular congestion. BILATERAL pulmonary infiltrates question pulmonary edema. Suspect small bibasilar effusions. No pneumothorax. Prior cervical spine fusion.  IMPRESSION: No pneumothorax following RIGHT jugular line placement. Enlargement of cardiac silhouette with vascular congestion and suspected BILATERAL pulmonary edema. Electronically Signed   By: [name] M.D.   On: 01/12/2016 16:50     STUDIES:  CXR 4/21> CXR 4/20>   CULTURES: Resp culture 4/21 > Ucx 4/21 >  ANTIBIOTICS:   SIGNIFICANT EVENTS: ETT 4/21>  LINES/TUBES: ETT 4/21>  DISCUSSION: 54 yo woman with history of CHF and COPD who came in with respiratory distress and was emergently intubated on 4/21 due to difficulty maintaining O2 sats above 90 while on biPap.  ASSESSMENT / PLAN:  PULMONARY A: Acute hypoxic and hypercarbic respiratory failure secondary to CHF exacerbation  COPD  P:   -continue mechanical ventilation, wean as tolerated  -wean sedation as able  -duonebs -check abg and CXR today   CARDIOVASCULAR A:  HTn Mitral stenosis-echo 4/4 with severe stenosis  CHF with grade 2 diastolic dysfx- last echo 4/4 with EF 60-65% with grade 2 diastolic dysfunction 4/22 >-7 L Bal on lasix  , min elevated troponin ? Demand  P:  -continue mechanical ventilation -lasix 80 mg BID IV - hold metoprolol, losartan-as b/p soft -restart as able  -strict I & O -tr troponin  -check bnp     RENAL  A:   Creatinine at baseline Hypokalemia  hypomag hypophos pulm edema P:   -cont lasix  -repleted K -mag and phos repleted , check in am   GASTROINTESTINAL A:   GERD P:   PPI Cont  TF   HEMATOLOGIC A:   Anaemia DVT prev  P:  Sub q hep Cbc in am , follow wbc  INFECTIOUS A:  Fever   P:   Follow up cultures Check BC x 2 .   ENDOCRINE A:   Hypothyroidism T2Dm, most recent a1c of 6.7  HLD 4/22>BS tr up  P:   Increase Lantus 15 units daily with TF -SSI -monitor CBGs -synthroid-change to per tube  -check tsh  NEUROLOGIC A:   Bipolar History of seizures- no reported seizure  depression  P:   RASS goal: 0 to -1 Continue depakote,  consider level  Hold Xanax abilify Wean sedation as able fent and propofol  FAMILY  - Updates: none  - Inter-disciplinary family meet or Palliative Care meeting due by:  4/28   Elizabeth Oaks NP - C Pulmonary and Critical Care Medicine [facility] Pager: [phone]  01/13/2016, 12:43 PM     STAFF NOTE: I, Dr [name] have personally reviewed patient's available data, including medical history, events of note, physical examination and test results as part of my evaluation. I have discussed with resident/NP and other care providers such as pharmacist, RN and RRT.  In addition,  I personally evaluated patient and elicited key findings of   S: new fever x 24h. On vent - sedated with diprivan gtt. BP a bit soft and lopressor  stopped  O: Obese female on vent Sync with vent RASS -3 on diprivan gtt  cxr - visulaized - very wet Labs - reviewede  A: acute hypoxemic resp failure with infiltrates - acute diast chf suspected Possible HCAP 01/13/2016   P: full vent support Agree with lopressor dc cotninue lasix  bid Trach aspirate pending + check PCT  + check lactate + check blood culture -> start vanc/zosyn  No family at 01/13/2016 2:44 PM    .  Rest per NP/medical resident whose note is outlined above and that I agree with  The patient is critically ill with multiple organ systems failure and requires high complexity decision making for assessment and support, frequent evaluation and titration of therapies, application of advanced monitoring technologies and extensive interpretation of multiple databases.   Critical Care Time devoted to patient care services described in this note is  30  Minutes. This time reflects time of care of this signee Dr Kalman Shan. This critical care time does not reflect procedure time, or teaching time or supervisory time of PA/NP/Med student/Med Resident etc but could involve care discussion time    Dr. Kalman Shan, M.D.,  [facility].C.P Pulmonary and Critical Care Medicine Staff Physician [facility] [place] [facility] Pager: [phone], If no answer or between  15:00h - 7:00h: call [phone]  01/13/2016 2:42 PM

## 2016-01-13 NOTE — Progress Notes (Signed)
eLink Physician-Brief Progress Note Patient Name: Elizabeth BuryMary J [name] DOB: Sep 15, 1961 MRN: [id number]   Date of Service  01/13/2016  HPI/Events of Note  Hypokalemia, hypophosphatemia, hypomag  eICU Interventions  Potassium, phos, mag replaced     Intervention Category Intermediate Interventions: Electrolyte abnormality - evaluation and management  DETERDING,ELIZABETH 01/13/2016, 2:38 AM

## 2016-01-13 NOTE — Progress Notes (Signed)
eLink Physician-Brief Progress Note Patient Name: Evaristo BuryMary J Martinezlopez DOB: November 27, 1960 MRN: [id number]   Date of Service  01/13/2016  HPI/Events of Note  Valproic Acid level = 28.0. Pharmacy recommends increasing dose.   eICU Interventions  Will increase Valproic Acid to 500 mg via tube BID.     Intervention Category Intermediate Interventions: Diagnostic test evaluation  Lenell AntuSommer,Ryleigh Buenger Eugene 01/13/2016, 10:41 PM

## 2016-01-13 NOTE — Progress Notes (Signed)
Pharmacy Antibiotic Note  [name] is a 55 y.o. female admitted on 01/11/2016 with pneumonia.  Pharmacy has been consulted for vancomycin and zosyn dosing.  Plan: Vancomycin 1250 IV every 12 hours.  Goal trough 15-20 mcg/mL. Zosyn 3.375g IV q8h (4 hour infusion).  Weight: 207 lb (93.895 kg)  Temp (24hrs), Avg:100.6 F (38.1 C), Min:99.4 F (37.4 C), Max:101.8 F (38.8 C)   Recent Labs Lab 01/08/16 0415 01/09/16 0903 01/10/16 0812 01/11/16 1843 01/12/16 0258 01/13/16 0159  WBC 9.1 7.8 10.6* 10.6* 9.9  --   CREATININE 0.68 0.72 0.62 0.74 0.68 0.92    Estimated Creatinine Clearance: 65.4 mL/min (by C-G formula based on Cr of 0.92).    Allergies  Allergen Reactions  . Trazodone And Nefazodone Anaphylaxis and Other (See Comments)    Shortness of breath.  . Celecoxib Nausea Only  . Ibuprofen Nausea Only    Antimicrobials this admission: Zosyn 4/22>> Vancomycin 4/22>>  Dose adjustments this admission: N/A  Microbiology results: 4/20 Urine 100 K ecoli, 40 k kleb 4/21 Sputum>> 4/22 Blood x 2>>  Isaac BlissMichael Yeray Tomas, PharmD, BCPS, [facility] Clinical Pharmacist Pager [phone] 01/13/2016 2:44 PM

## 2016-01-13 NOTE — Progress Notes (Signed)
eLink Physician-Brief Progress Note Patient Name: [name] DOB: 18-Mar-1961 MRN: [id number]   Date of Service  01/13/2016  HPI/Events of Note  Hypotension - BP = 85/45. Albumin = 2.5. Patient has been diuresed today.   eICU Interventions  Will order 5% albumin 12.5 gm IV now.      Intervention Category Major Interventions: Hypotension - evaluation and management  Lenell AntuSommer,Zayleigh Stroh Eugene 01/13/2016, 10:36 PM

## 2016-01-13 NOTE — Progress Notes (Signed)
eLink Physician-Brief Progress Note Patient Name: Elizabeth BuryMary J Zettler DOB: 12-27-1960 MRN: [id number]   Date of Service  01/13/2016  HPI/Events of Note  Request to renew bilateral wrist restraints.  eICU Interventions  Will renew bilateral wrist restraints.      Intervention Category Minor Interventions: Agitation / anxiety - evaluation and management  Lenell AntuSommer,Steven Eugene 01/13/2016, 8:39 PM

## 2016-01-14 ENCOUNTER — Inpatient Hospital Stay (HOSPITAL_COMMUNITY): Payer: Medicaid Other

## 2016-01-14 DIAGNOSIS — I10 Essential (primary) hypertension: Secondary | ICD-10-CM

## 2016-01-14 DIAGNOSIS — J9690 Respiratory failure, unspecified, unspecified whether with hypoxia or hypercapnia: Secondary | ICD-10-CM

## 2016-01-14 DIAGNOSIS — E785 Hyperlipidemia, unspecified: Secondary | ICD-10-CM

## 2016-01-14 DIAGNOSIS — I050 Rheumatic mitral stenosis: Secondary | ICD-10-CM

## 2016-01-14 LAB — BASIC METABOLIC PANEL
Anion gap: 15 (ref 5–15)
BUN: 39 mg/dL — ABNORMAL HIGH (ref 6–20)
CO2: 37 mmol/L — AB (ref 22–32)
CREATININE: 1.06 mg/dL — AB (ref 0.44–1.00)
Calcium: 7.7 mg/dL — ABNORMAL LOW (ref 8.9–10.3)
Chloride: 92 mmol/L — ABNORMAL LOW (ref 101–111)
GFR calc non Af Amer: 58 mL/min — ABNORMAL LOW (ref >60)
GLUCOSE: 354 mg/dL — AB (ref 65–99)
Potassium: 2.9 mmol/L — ABNORMAL LOW (ref 3.5–5.1)
Sodium: 144 mmol/L (ref 135–145)

## 2016-01-14 LAB — GLUCOSE, CAPILLARY
GLUCOSE-CAPILLARY: 272 mg/dL — AB (ref 65–99)
GLUCOSE-CAPILLARY: 209 mg/dL — AB (ref 65–99)
GLUCOSE-CAPILLARY: 211 mg/dL — AB (ref 65–99)
GLUCOSE-CAPILLARY: 143 mg/dL — AB (ref 65–99)
GLUCOSE-CAPILLARY: 124 mg/dL — AB (ref 65–99)
GLUCOSE-CAPILLARY: 173 mg/dL — AB (ref 65–99)
Glucose-Capillary: 104 mg/dL — ABNORMAL HIGH (ref 65–99)
Glucose-Capillary: 165 mg/dL — ABNORMAL HIGH (ref 65–99)
Glucose-Capillary: 202 mg/dL — ABNORMAL HIGH (ref 65–99)
Glucose-Capillary: 214 mg/dL — ABNORMAL HIGH (ref 65–99)
Glucose-Capillary: 278 mg/dL — ABNORMAL HIGH (ref 65–99)
Glucose-Capillary: 318 mg/dL — ABNORMAL HIGH (ref 65–99)
Glucose-Capillary: 333 mg/dL — ABNORMAL HIGH (ref 65–99)
Glucose-Capillary: 92 mg/dL (ref 65–99)
Glucose-Capillary: 98 mg/dL (ref 65–99)

## 2016-01-14 LAB — CBC
HCT: 25.2 % — ABNORMAL LOW (ref 36.0–46.0)
Hemoglobin: 7.3 g/dL — ABNORMAL LOW (ref 12.0–15.0)
MCH: 23.5 pg — AB (ref 26.0–34.0)
MCHC: 29 g/dL — AB (ref 30.0–36.0)
MCV: 81 fL (ref 78.0–100.0)
PLATELETS: 194 10*3/uL (ref 150–400)
RBC: 3.11 MIL/uL — ABNORMAL LOW (ref 3.87–5.11)
RDW: 18.6 % — ABNORMAL HIGH (ref 11.5–15.5)
WBC: 13.9 10*3/uL — ABNORMAL HIGH (ref 4.0–10.5)

## 2016-01-14 LAB — PROCALCITONIN: Procalcitonin: 0.86 ng/mL

## 2016-01-14 LAB — MAGNESIUM
MAGNESIUM: 2.1 mg/dL (ref 1.7–2.4)
Magnesium: 2.1 mg/dL (ref 1.7–2.4)
Magnesium: 2.2 mg/dL (ref 1.7–2.4)

## 2016-01-14 LAB — PHOSPHORUS
Phosphorus: 4.5 mg/dL (ref 2.5–4.6)
Phosphorus: 5.5 mg/dL — ABNORMAL HIGH (ref 2.5–4.6)
Phosphorus: 4.6 mg/dL (ref 2.5–4.6)

## 2016-01-14 LAB — BRAIN NATRIURETIC PEPTIDE: B Natriuretic Peptide: 289.7 pg/mL — ABNORMAL HIGH (ref 0.0–100.0)

## 2016-01-14 LAB — LACTIC ACID, PLASMA: LACTIC ACID, VENOUS: 1.3 mmol/L (ref 0.5–2.0)

## 2016-01-14 LAB — TROPONIN I: Troponin I: 0.09 ng/mL — ABNORMAL HIGH (ref <0.031)

## 2016-01-14 MED ORDER — MIDAZOLAM HCL 2 MG/2ML IJ SOLN
1.0000 mg | INTRAMUSCULAR | Status: DC | PRN
  Administered 2016-01-14: 4 mg via INTRAVENOUS
  Administered 2016-01-14 – 2016-01-16 (×10): 2 mg via INTRAVENOUS
  Administered 2016-01-16: 3 mg via INTRAVENOUS
  Administered 2016-01-16 (×2): 2 mg via INTRAVENOUS
  Administered 2016-01-16: 3 mg via INTRAVENOUS
  Administered 2016-01-16: 2 mg via INTRAVENOUS
  Administered 2016-01-17: 4 mg via INTRAVENOUS
  Administered 2016-01-17 – 2016-01-20 (×3): 2 mg via INTRAVENOUS
  Administered 2016-01-21 – 2016-01-23 (×2): 4 mg via INTRAVENOUS
  Administered 2016-01-23: 2 mg via INTRAVENOUS
  Filled 2016-01-14 (×4): qty 2
  Filled 2016-01-14: qty 4
  Filled 2016-01-14 (×4): qty 2
  Filled 2016-01-14: qty 4
  Filled 2016-01-14: qty 2
  Filled 2016-01-14: qty 4
  Filled 2016-01-14 (×2): qty 2
  Filled 2016-01-14: qty 4
  Filled 2016-01-14 (×6): qty 2
  Filled 2016-01-14 (×2): qty 4

## 2016-01-14 MED ORDER — SODIUM CHLORIDE 0.9 % IV SOLN
INTRAVENOUS | Status: DC
  Administered 2016-01-14: 2.7 [IU]/h via INTRAVENOUS
  Administered 2016-01-15: 1.9 [IU]/h via INTRAVENOUS
  Filled 2016-01-14 (×2): qty 2.5

## 2016-01-14 MED ORDER — FENTANYL CITRATE (PF) 100 MCG/2ML IJ SOLN: 50.0000 ug | INTRAMUSCULAR | Status: DC | PRN

## 2016-01-14 MED ORDER — POTASSIUM CHLORIDE 20 MEQ/15ML (10%) PO SOLN
40.0000 meq | Freq: Once | ORAL | Status: AC
  Administered 2016-01-14: 40 meq
  Filled 2016-01-14: qty 30

## 2016-01-14 MED ORDER — POTASSIUM CHLORIDE 10 MEQ/100ML IV SOLN
10.0000 meq | INTRAVENOUS | Status: AC
  Administered 2016-01-14 (×4): 10 meq via INTRAVENOUS
  Filled 2016-01-14 (×4): qty 100

## 2016-01-14 MED ORDER — FUROSEMIDE 10 MG/ML IJ SOLN
80.0000 mg | Freq: Three times a day (TID) | INTRAMUSCULAR | Status: DC
  Administered 2016-01-14: 80 mg via INTRAVENOUS
  Filled 2016-01-14 (×3): qty 8

## 2016-01-14 NOTE — Progress Notes (Signed)
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  Patient was recently admitted on 4/1 for AoCHF and COPDex. She had to be intubated, as she decompensated from respiratory standpoint as it was thought to be due to ARDS secondary to aspiration. She improved slowly and was transferred to to inpt rehab on 4/18.  She presented this time with acute respiratory failure secondary to acute diastolic congestive heart failure with last echo showing EF of 60% with grade 2 diastolic dysfunction. [name] on home lasix 40 mg BID , but was only taking 40 mg daily while on inpt rehab, so likely this contributed to the exacerbation. She was started on lasix, and on 4/21, rn was having difficulty keeping the patient's sats above 90 despite Bipap. Pt had to be emergently intubated, and PCCM was asked to take over.   Subjective :  Fever 4/22 , BC done , Vanc and Zosyn added  B/p soft overnight , given fluid bolus with some improvement.  Good response diuresis yesterday   VITAL SIGNS: BP 97/50 mmHg  Pulse 77  Temp(Src) 100.3 F (37.9 C) (Oral)  Resp 13  Wt 231 lb (104.781 kg)  SpO2 99%  HEMODYNAMICS:    VENTILATOR SETTINGS: Vent Mode:  [-] PRVC FiO2 (%):  [40 %-60 %] 40 % Set Rate:  [12 bmp] 12 bmp Vt Set:  [430 mL] 430 mL PEEP:  [8 cmH20] 8 cmH20 Plateau Pressure:  [24 cmH20-30 cmH20] 26 cmH20  INTAKE / OUTPUT: I/O last 3 completed shifts: In: 3488.2 [I.V.:868.2; NG/GT:520; IV Piggyback:2100] Out: 5165 [Urine:4840; Emesis/NG output:325]  PHYSICAL EXAMINATION: General:  Sedated on vent  Neuro:  Sedated  HEENT:   jvd noted Cardiovascular:  s1 s2 RRT murm Lungs:  Coarse BS , no wheezing  Abdomen:  Soft, obese, NT, ND Musculoskeletal: tr edema  Skin:  No rash  LABS:  BMET  Recent Labs Lab 01/12/16 0258 01/13/16 0159 01/14/16 0625  NA 145 143 144  K 3.2* 2.8* 2.9*  CL 90* 86* 92*  CO2 41* 41* 37*  BUN 9 20 39*  CREATININE 0.68 0.92 1.06*  GLUCOSE 134* 195* 354*    Electrolytes  Recent Labs Lab 01/12/16 0258  01/13/16 0159 01/13/16 1420 01/14/16 0115 01/14/16 0625  CALCIUM 9.0  --  8.7*  --   --  7.7*  MG  --   < > 1.2* 2.4 2.1 2.1  PHOS  --   < > 1.9* 2.5 4.5 5.5*  < > = values in this interval not displayed.  CBC  Recent Labs Lab 01/11/16 1843 01/12/16 0258 01/14/16 0625  WBC 10.6* 9.9 13.9*  HGB 9.1* 8.8* 7.3*  HCT 31.2* 29.6* 25.2*  PLT 250 244 194    Coag's No results for input(s): APTT, INR in the last 168 hours.  Sepsis Markers  Recent Labs Lab 01/13/16 1420 01/13/16 1514 01/14/16 0115 01/14/16 0625  LATICACIDVEN  --  3.4* 1.3  --   PROCALCITON 1.01  --   --  0.86    ABG  Recent Labs Lab 01/11/16 2030 01/12/16 1117 01/13/16 1458  PHART 7.502* 7.567* 7.571*  PCO2ART 61.0* 53.5* 44.2  PO2ART 68.9* 227.0* 79.5*    Liver Enzymes  Recent Labs Lab 01/10/16 0812 01/11/16 1843 01/12/16 0258  AST 15 14* 11*  ALT 16 17 13*  ALKPHOS 81 86 72  BILITOT 1.3* 1.4* 1.6*  ALBUMIN 3.4* 3.2* 2.6*    Cardiac Enzymes  Recent Labs Lab 01/13/16 1506 01/13/16 1800 01/14/16 0115  TROPONINI 0.10* 0.09* 0.09*    Glucose  Recent Labs Lab 01/13/16 2352 01/14/16 0401 01/14/16 0559 01/14/16 0714 01/14/16 0815 01/14/16 0945  GLUCAP 214* 272* 333* 318* 278* 202*    Imaging Dg Chest Port 1 View  01/14/2016  CLINICAL DATA:  Acute respiratory failure. On ventilator. Congestive heart failure. COPD. EXAM: PORTABLE CHEST 1 VIEW COMPARISON:  01/13/2016 FINDINGS: Patient is rotated to the right. Endotracheal tube, nasogastric tube, and right  jugular central venous catheter remain in appropriate position. Cardiomegaly stable. Symmetric bilateral airspace disease with bibasilar predominance shows no significant change, most likely due to pulmonary edema or ARDS. No pneumothorax visualized. No definite pleural effusion noted on this portable exam. IMPRESSION: Stable cardiomegaly and symmetric bilateral airspace disease, most likely due to pulmonary edema or ARDS. Electronically Signed   By: Myles Rosenthal M.D.   On: 01/14/2016 08:32   Dg Chest Port 1v Same Day  01/13/2016  CLINICAL DATA:  Pt has acute respiratory failure secondary to acute diastolic congestive heart failure, pulmonary edema, and a fever. Hx HTN, COPD, mitral stenosis, CHF, emphysema EXAM: PORTABLE CHEST 1 VIEW COMPARISON:  01/12/2016 FINDINGS: Hazy lung opacity, most evident in the lower lungs, it is likely from layering pleural effusions. There may be an element of airspace edema. Interstitial thickening noted on prior studies has improved consistent with improved interstitial edema. No pneumothorax. Endotracheal tube, right internal jugular central venous line and oral/nasogastric tube are stable. IMPRESSION: 1. Mild improvement in lung aeration. There is less interstitial thickening consistent with improved interstitial edema. 2. Persistent hazy lung opacity may reflect residual airspace edema or be due to layering pleural fluid. 3. No new abnormalities. 4. Support apparatus is stable Electronically Signed   By: Amie Portland M.D.   On: 01/13/2016 13:47     STUDIES:  4/4 Echo EF 60-65%, Mod LA dilated , MV severe stenosis , Gr 2 DD    CULTURES: Resp culture 4/21 > Ucx 4/21 >not done  [facility] 4/22 >   ANTIBIOTICS: Vanc 4/22 >. Zosyn 4/22 >>  SIGNIFICANT EVENTS:  LINES/TUBES: ETT 4/21>  DISCUSSION: 55 yo woman with history of CHF and COPD who came in with respiratory distress and was emergently intubated on 4/21 due to difficulty maintaining O2 sats above 90 while on  biPap.  ASSESSMENT / PLAN:  PULMONARY A: Acute hypoxic and hypercarbic respiratory failure secondary to CHF exacerbation  COPD 4/22 >pH 7.5   P:   - adjust vent setting per Dr. Marchelle Gearing .  -wean sedation as able  -duonebs -ABX per ID sxn    CARDIOVASCULAR A:  HTn Mitral stenosis-echo 4/4 with severe stenosis  CHF with grade 2 diastolic dysfx- last echo 4/4 with EF 60-65% with grade 2 diastolic dysfunction Hypotension requiring fluid bolus 4/22  4/23 >-6.7 L Bal on lasix  , min elevated troponin ? Demand  P:  -continue mechanical ventilation -hold lasix today as b/p cont to be soft .  - hold metoprolol, losartan-as b/p soft -restart as able  -strict I & O    RENAL  A:   Renal Insufficiency (baseline 0.62 )  Hypokalemia  hypomag-resolved  hypophos-resolved  pulm edema P:   -hold lasix  -repleted K   GASTROINTESTINAL A:   GERD P:   PPI Cont  TF   HEMATOLOGIC A:   Anaemia DVT prev 4/23>hbg tr down 9.1>8.8>7.3-no act bleeding   P:  Sub q hep Cbc in am , follow wbc  INFECTIOUS A:  Fever ? 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  HLD 4/23>BS tr up on TF  P:   -Cont Lantus 15 units daily -SSI -cont insulin drip  -monitor CBGs -synthroid-cont    NEUROLOGIC A:   Bipolar History of seizures- no reported seizure  depression  P:   RASS goal: 0 to -1 Continue depakote- level 28 , dose increased 4/22  Hold Xanax abilify Wean sedation as able fent and propofol  FAMILY  - Updates: none  - Inter-disciplinary family meet or Palliative Care meeting due by:  4/28   Rubye Oaksammy Tyee Vandevoorde NP - C Pulmonary and Critical Care Medicine [facility] Pager: [phone]  01/14/2016, 10:33 AM

## 2016-01-14 NOTE — Consult Note (Addendum)
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  12/25/2015  CLINICAL DATA:  Shortness of breath. EXAM: PORTABLE CHEST 1 VIEW COMPARISON:  12/22/2015. FINDINGS: Cardiomegaly persists. Worsening aeration with interstitial and alveolar prominence bilaterally suggesting CHF. BILATERAL pneumonia less favored. Previous LEFT shoulder replacement, and cervical fusion. Incomplete healing of the RIGHT humeral fracture status post ORIF. IMPRESSION: Worsening aeration.  CHF is favored. Electronically Signed   By: Elsie Stain M.D.   On: 12/25/2015 11:09   Dg Chest Port 1v Same Day  01/13/2016  CLINICAL DATA:  Pt has acute respiratory failure secondary to acute diastolic congestive heart failure, pulmonary edema, and a fever. Hx HTN, COPD, mitral stenosis, CHF, emphysema EXAM: PORTABLE CHEST 1 VIEW COMPARISON:  01/12/2016 FINDINGS: Hazy lung opacity, most evident in the lower lungs, it is likely from layering pleural effusions. There may be an element of airspace edema. Interstitial thickening noted on prior studies has improved consistent with improved interstitial edema. No pneumothorax. Endotracheal tube, right internal jugular central venous  line and oral/nasogastric tube are stable. IMPRESSION: 1. Mild improvement in lung aeration. There is less interstitial thickening consistent with improved interstitial edema. 2. Persistent hazy lung opacity may reflect residual airspace edema or be due to layering pleural fluid. 3. No new abnormalities. 4. Support apparatus is stable Electronically Signed   By: Amie Portland M.D.   On: 01/13/2016 13:47   Dg Abd Portable 1v  12/26/2015  CLINICAL DATA:  NG tube placement. EXAM: PORTABLE ABDOMEN - 1 VIEW COMPARISON:  One-view abdomen from the same day. FINDINGS: The tip of a small bore feeding tube is now in the distal duodenum, at the ligament of Treitz. The bowel gas pattern is normal. Left basilar airspace disease process. IMPRESSION: 1. The tip of a small bore feeding tube is in the distal duodenum at the ligament of Treitz. 2. Persistent left lower lobe airspace disease. Electronically Signed   By: Marin Roberts M.D.   On: 12/26/2015 15:02   Dg Abd Portable 1v  12/26/2015  CLINICAL DATA:  Feeding tube placement EXAM: PORTABLE ABDOMEN - 1 VIEW COMPARISON:  09/11/2013 FINDINGS: The tip of the feeding tube is in the gastric antrum. No disproportionate dilatation of bowel. No obvious free intraperitoneal gas. Patchy airspace disease in both lower lungs. IMPRESSION: Feeding tube tip is at the gastric antrum. Electronically Signed   By: Jolaine Click M.D.   On: 12/26/2015 12:25   ECG - NSR. PACs.  Echocardiogram 12/26/15 - EF 60-65%, Grade 2 diastolic dysfunction, Moderate LAE.  MV with restricted mobility, discrepant area, mean gradient 20 mmHg.    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    # Hypoxic  Respiratory Failure - This is likely multifactorial, but the primary cause at this point is her Staph PNA (not yet speciated), based on her copious sputum & imaging.  It is also possible that volume (from MS & diastolic dysfunction) is contributing.  Her physical exam is difficult due to her body habitus, & her BNP is likely blunted  by her body habitus.  [name] theoretically 8L negative this admission, though her weights do not support this.  Diuresis has been further challenging due to the large amounts of infusion she has required for her Propofol (now stopped) & Insulin. - Based on increasing Cr, we have held her third dose of Furosemide to monitor the response of her Cr. - Additional methods of volume assessment could include a limited echocardiogram of her IVC &/or a pulmonary artery catheter, but we will monitor her response to slightly holding back diuretic for now.   - It is likely with her borderline albumin that she houses a lot of extravascular fluid that will take time to equilibrate. - Monitor strict I/O & daily weights.    # h/o Rheumatic MS - Based on the severity of disease, she may meet criteria for balloon commissurotomy, but as per above, this is not the predominating cause of her presentation at the moment. - This can be further discussed later in the admission versus an outpatient basis.  Workup for this would need include a TEE.    # h/o HTN - With sepsis, she is currently borderline hypotensive. - We agree with holding her home Losartan & Metoprolol.    # h/o HLD - She does not have a recent lipid panel on file, & she is not on treatment for this at home. - With her DM, she is certainly an ideal candidate for Statin therapy, but this can be discussed later in the admission versus an outpatient basis.    Signed,  Lance Morinlivia Advay Volante MD

## 2016-01-14 NOTE — Progress Notes (Signed)
Sedation turned off, pt awake and alert,  placed pt on PS trial at this time.  Increased PS to 10 due to decreased VT's, pt tolerating 10/8 well at this time, RT will monitor

## 2016-01-15 ENCOUNTER — Inpatient Hospital Stay (HOSPITAL_COMMUNITY): Payer: Medicaid Other

## 2016-01-15 LAB — GLUCOSE, CAPILLARY
GLUCOSE-CAPILLARY: 162 mg/dL — AB (ref 65–99)
GLUCOSE-CAPILLARY: 144 mg/dL — AB (ref 65–99)
GLUCOSE-CAPILLARY: 130 mg/dL — AB (ref 65–99)
GLUCOSE-CAPILLARY: 112 mg/dL — AB (ref 65–99)
GLUCOSE-CAPILLARY: 126 mg/dL — AB (ref 65–99)
GLUCOSE-CAPILLARY: 135 mg/dL — AB (ref 65–99)
GLUCOSE-CAPILLARY: 246 mg/dL — AB (ref 65–99)
Glucose-Capillary: 152 mg/dL — ABNORMAL HIGH (ref 65–99)
Glucose-Capillary: 173 mg/dL — ABNORMAL HIGH (ref 65–99)
Glucose-Capillary: 180 mg/dL — ABNORMAL HIGH (ref 65–99)
Glucose-Capillary: 185 mg/dL — ABNORMAL HIGH (ref 65–99)
Glucose-Capillary: 194 mg/dL — ABNORMAL HIGH (ref 65–99)
Glucose-Capillary: 195 mg/dL — ABNORMAL HIGH (ref 65–99)
Glucose-Capillary: 246 mg/dL — ABNORMAL HIGH (ref 65–99)
Glucose-Capillary: 258 mg/dL — ABNORMAL HIGH (ref 65–99)
Glucose-Capillary: 269 mg/dL — ABNORMAL HIGH (ref 65–99)

## 2016-01-15 LAB — C DIFFICILE QUICK SCREEN W PCR REFLEX
C Diff antigen: POSITIVE — AB
C Diff toxin: NEGATIVE

## 2016-01-15 LAB — BASIC METABOLIC PANEL
ANION GAP: 12 (ref 5–15)
Anion gap: 13 (ref 5–15)
BUN: 38 mg/dL — AB (ref 6–20)
BUN: 29 mg/dL — ABNORMAL HIGH (ref 6–20)
CALCIUM: 8.2 mg/dL — AB (ref 8.9–10.3)
CHLORIDE: 101 mmol/L (ref 101–111)
CO2: 39 mmol/L — AB (ref 22–32)
CO2: 36 mmol/L — ABNORMAL HIGH (ref 22–32)
CREATININE: 0.86 mg/dL (ref 0.44–1.00)
Calcium: 8.8 mg/dL — ABNORMAL LOW (ref 8.9–10.3)
Chloride: 104 mmol/L (ref 101–111)
Creatinine, Ser: 0.71 mg/dL (ref 0.44–1.00)
GFR calc Af Amer: >60 mL/min (ref >60)
GFR calc non Af Amer: >60 mL/min (ref >60)
GFR calc non Af Amer: >60 mL/min (ref >60)
Glucose, Bld: 139 mg/dL — ABNORMAL HIGH (ref 65–99)
Glucose, Bld: 244 mg/dL — ABNORMAL HIGH (ref 65–99)
Potassium: 2.8 mmol/L — ABNORMAL LOW (ref 3.5–5.1)
Potassium: 3.3 mmol/L — ABNORMAL LOW (ref 3.5–5.1)
Sodium: 152 mmol/L — ABNORMAL HIGH (ref 135–145)
Sodium: 153 mmol/L — ABNORMAL HIGH (ref 135–145)

## 2016-01-15 LAB — CBC
HCT: 26 % — ABNORMAL LOW (ref 36.0–46.0)
Hemoglobin: 7.5 g/dL — ABNORMAL LOW (ref 12.0–15.0)
MCH: 23.7 pg — AB (ref 26.0–34.0)
MCHC: 28.8 g/dL — ABNORMAL LOW (ref 30.0–36.0)
MCV: 82.3 fL (ref 78.0–100.0)
PLATELETS: 228 10*3/uL (ref 150–400)
RBC: 3.16 MIL/uL — AB (ref 3.87–5.11)
RDW: 18.4 % — AB (ref 11.5–15.5)
WBC: 14.7 10*3/uL — ABNORMAL HIGH (ref 4.0–10.5)

## 2016-01-15 LAB — LACTIC ACID, PLASMA: Lactic Acid, Venous: 1.4 mmol/L (ref 0.5–2.0)

## 2016-01-15 LAB — PHOSPHORUS: PHOSPHORUS: 3.2 mg/dL (ref 2.5–4.6)

## 2016-01-15 LAB — CULTURE, RESPIRATORY W GRAM STAIN

## 2016-01-15 LAB — MAGNESIUM: Magnesium: 2 mg/dL (ref 1.7–2.4)

## 2016-01-15 LAB — PROCALCITONIN: PROCALCITONIN: 0.58 ng/mL

## 2016-01-15 MED ORDER — CEFAZOLIN SODIUM-DEXTROSE 2-4 GM/100ML-% IV SOLN
2.0000 g | Freq: Three times a day (TID) | INTRAVENOUS | Status: AC
  Administered 2016-01-15 – 2016-01-19 (×13): 2 g via INTRAVENOUS
  Filled 2016-01-15 (×14): qty 100

## 2016-01-15 MED ORDER — POTASSIUM CHLORIDE 10 MEQ/50ML IV SOLN
10.0000 meq | INTRAVENOUS | Status: AC
  Administered 2016-01-15 (×4): 10 meq via INTRAVENOUS
  Filled 2016-01-15 (×4): qty 50

## 2016-01-15 MED ORDER — FREE WATER
250.0000 mL | Freq: Four times a day (QID) | Status: DC
  Administered 2016-01-15 – 2016-01-16 (×5): 250 mL

## 2016-01-15 MED ORDER — INSULIN ASPART 100 UNIT/ML ~~LOC~~ SOLN
3.0000 [IU] | SUBCUTANEOUS | Status: DC
  Administered 2016-01-15 – 2016-01-17 (×11): 3 [IU] via SUBCUTANEOUS

## 2016-01-15 MED ORDER — POTASSIUM CHLORIDE 20 MEQ/15ML (10%) PO SOLN
40.0000 meq | Freq: Once | ORAL | Status: AC
  Administered 2016-01-15: 40 meq
  Filled 2016-01-15: qty 30

## 2016-01-15 MED ORDER — METRONIDAZOLE IN NACL 5-0.79 MG/ML-% IV SOLN
500.0000 mg | Freq: Three times a day (TID) | INTRAVENOUS | Status: DC
  Administered 2016-01-15 – 2016-01-23 (×24): 500 mg via INTRAVENOUS
  Filled 2016-01-15 (×30): qty 100

## 2016-01-15 MED ORDER — FUROSEMIDE 10 MG/ML IJ SOLN
80.0000 mg | Freq: Two times a day (BID) | INTRAMUSCULAR | Status: DC
  Administered 2016-01-15 – 2016-01-22 (×13): 80 mg via INTRAVENOUS
  Filled 2016-01-15 (×14): qty 8

## 2016-01-15 MED ORDER — PRO-STAT SUGAR FREE PO LIQD
30.0000 mL | Freq: Four times a day (QID) | ORAL | Status: DC
  Administered 2016-01-15 – 2016-01-17 (×8): 30 mL
  Filled 2016-01-15 (×11): qty 30

## 2016-01-15 MED ORDER — VITAL HIGH PROTEIN PO LIQD
1000.0000 mL | ORAL | Status: DC
  Administered 2016-01-16 – 2016-01-17 (×2): 1000 mL

## 2016-01-15 MED ORDER — CEFAZOLIN SODIUM-DEXTROSE 2-4 GM/100ML-% IV SOLN
2.0000 g | Freq: Three times a day (TID) | INTRAVENOUS | Status: AC
  Administered 2016-01-15: 2 g via INTRAVENOUS
  Filled 2016-01-15: qty 100

## 2016-01-15 MED ORDER — INSULIN ASPART 100 UNIT/ML ~~LOC~~ SOLN
0.0000 [IU] | SUBCUTANEOUS | Status: DC
  Administered 2016-01-15: 4 [IU] via SUBCUTANEOUS
  Administered 2016-01-15: 11 [IU] via SUBCUTANEOUS
  Administered 2016-01-15: 4 [IU] via SUBCUTANEOUS
  Administered 2016-01-16: 15 [IU] via SUBCUTANEOUS
  Administered 2016-01-16: 7 [IU] via SUBCUTANEOUS
  Administered 2016-01-16: 11 [IU] via SUBCUTANEOUS
  Administered 2016-01-16: 7 [IU] via SUBCUTANEOUS
  Administered 2016-01-16: 4 [IU] via SUBCUTANEOUS
  Administered 2016-01-16: 3 [IU] via SUBCUTANEOUS
  Administered 2016-01-17: 20 [IU] via SUBCUTANEOUS
  Administered 2016-01-17: 11 [IU] via SUBCUTANEOUS
  Administered 2016-01-17 (×2): 7 [IU] via SUBCUTANEOUS
  Administered 2016-01-18: 15 [IU] via SUBCUTANEOUS
  Administered 2016-01-18: 7 [IU] via SUBCUTANEOUS
  Administered 2016-01-18: 4 [IU] via SUBCUTANEOUS

## 2016-01-15 NOTE — Procedures (Signed)
Unable to obtain blood gas sample, charge therapist also attempted to get blood gas.

## 2016-01-15 NOTE — Progress Notes (Signed)
PULMONARY / CRITICAL CARE MEDICINE   Name: [name] MRN: [id number] DOB: 04-20-1961    ADMISSION DATE:  01/11/2016 CONSULTATION DATE:  4/21  REFERRING MD:  Dolphus Jenny, MD    HISTORY OF PRESENT ILLNESS:   This is a 55 yo F with COPD, CHF, diabetes, mitral stenosis secondary to rheumatic fever history, diabetes, bipolar, GERD, seizures, left shoulder fracture, who came in with acute restorative failure and acute encephalopathy.  Patient was recently admitted on 4/1 for AoCHF and COPDex. She had to be intubated, as she decompensated from respiratory standpoint as it was thought to be due to ARDS secondary to aspiration. She improved slowly and was transferred to to inpt rehab on 4/18.  She presented this time with acute respiratory failure secondary to acute diastolic congestive heart failure with last echo showing EF of 60% with grade 2 diastolic dysfunction. [name] on home lasix 40 mg BID , but was only taking 40 mg daily while on inpt rehab, so likely this contributed to the exacerbation. She was started on lasix, and on 4/21, rn was having difficulty keeping the patient's sats above 90 despite Bipap. Pt had to be emergently intubated, and PCCM was asked to take over.   Subjective :  Febrile overnight with new diarrhea  B/p stable overnight Good response diuresis yesterday, Cr improving  VITAL SIGNS: BP 110/60 mmHg  Pulse 98  Temp(Src) 99.4 F (37.4 C) (Oral)  Resp 21  Wt 201 lb (91.173 kg)  SpO2 100%  HEMODYNAMICS:    VENTILATOR SETTINGS: Vent Mode:  [-] PSV;CPAP FiO2 (%):  [40 %] 40 % Set Rate:  [12 bmp] 12 bmp Vt Set:  [430 mL] 430 mL PEEP:  [8 cmH20] 8 cmH20 Pressure Support:  [10 cmH20] 10 cmH20 Plateau Pressure:  [22 cmH20-25 cmH20] 25 cmH20  INTAKE / OUTPUT: I/O last 3 completed shifts: In: 3052.5 [I.V.:684; NG/GT:515; IV Piggyback:1853.5] Out: 4150 [Urine:4150]  PHYSICAL EXAMINATION: General:  Alert on vent Neuro: Pupils reactive bilaterally, normocephalic   HEENT:  jvd noted Cardiovascular:  s1 s2 RRT murm Lungs:  Coarse BS , no wheezing  Abdomen:  Soft, obese, NT, ND Musculoskeletal: tr edema  Skin:  No rash  LABS:  BMET  Recent Labs Lab 01/13/16 0159 01/14/16 0625 01/15/16 0400  NA 143 144 152*  K 2.8* 2.9* 2.8*  CL 86* 92* 101  CO2 41* 37* 39*  BUN 20 39* 38*  CREATININE 0.92 1.06* 0.86  GLUCOSE 195* 354* 139*    Electrolytes  Recent Labs Lab 01/13/16 0159  01/14/16 0625 01/14/16 1420 01/15/16 0400  CALCIUM 8.7*  --  7.7*  --  8.2*  MG 1.2*  < > 2.1 2.2 2.0  PHOS 1.9*  < > 5.5* 4.6 3.2  < > = values in this interval not displayed.  CBC  Recent Labs Lab 01/12/16 0258 01/14/16 0625 01/15/16 0400  WBC 9.9 13.9* 14.7*  HGB 8.8* 7.3* 7.5*  HCT 29.6* 25.2* 26.0*  PLT 244 194 228    Coag's No results for input(s): APTT, INR in the last 168 hours.  Sepsis Markers  Recent Labs Lab 01/13/16 1420 01/13/16 1514 01/14/16 0115 01/14/16 0625 01/15/16 0400  LATICACIDVEN  --  3.4* 1.3  --  1.4  PROCALCITON 1.01  --   --  0.86 0.58    ABG  Recent Labs Lab 01/11/16 2030 01/12/16 1117 01/13/16 1458  PHART 7.502* 7.567* 7.571*  PCO2ART 61.0* 53.5* 44.2  PO2ART 68.9* 227.0* 79.5*    Liver  Enzymes  Recent Labs Lab 01/10/16 0812 01/11/16 1843 01/12/16 0258  AST 15 14* 11*  ALT 16 17 13*  ALKPHOS 81 86 72  BILITOT 1.3* 1.4* 1.6*  ALBUMIN 3.4* 3.2* 2.6*    Cardiac Enzymes  Recent Labs Lab 01/13/16 1506 01/13/16 1800 01/14/16 0115  TROPONINI 0.10* 0.09* 0.09*    Glucose  Recent Labs Lab 01/15/16 0250 01/15/16 0403 01/15/16 0506 01/15/16 0556 01/15/16 0636 01/15/16 0747  GLUCAP 144* 130* 112* 126* 135* 246*    Imaging Ct Chest Wo Contrast  01/14/2016  CLINICAL DATA:  Acute respiratory distress requiring intubation 3 days ago. Worsening hypoxemia. EXAM: CT CHEST WITHOUT CONTRAST TECHNIQUE: Multidetector CT imaging of the chest was performed following the standard protocol  without IV contrast. COMPARISON:  Radiographs 01/14/2016 and 01/13/2016.  CT 09/11/2013. FINDINGS: Mediastinum/Nodes: Small mediastinal and hilar lymph nodes bilaterally are similar to the prior CT. Hilar assessment is limited by the lack of intravenous contrastand perihilar pulmonary opacities.There is no axillary adenopathy. Endotracheal tube, nasogastric tube and right IJ central venous catheter are in place. There is mild cardiomegaly. No significant pericardial fluid. Mild atherosclerosis is present. Lungs/Pleura: Small bilateral pleural effusions. There are patchy airspace opacities dependently in both lungs with mild volume loss in the lower lobes. There are patchy ground-glass opacities throughout the aerated lungs. No endobronchial lesion or suspicious pulmonary nodule. Upper abdomen: The visualized upper abdomen demonstrates no suspicious findings. Musculoskeletal/Chest wall: There is mild generalized edema within the chest wall. No acute osseous findings are seen. There is stable chronic sternal deformity. There are postsurgical changes in the lower cervical spine and both proximal humeri. IMPRESSION: 1. Dependent airspace opacities in both lungs with associated volume loss, similar to recent radiographs. Although likely in part secondary to atelectasis, distribution is suspicious for infection, possibly on the basis of aspiration. 2. Ground-glass opacities in the aerated lungs consistent with mild edema. 3. Small bilateral pleural effusions. Electronically Signed   By: Carey Bullocks M.D.   On: 01/14/2016 17:14   Dg Chest Port 1 View  01/15/2016  CLINICAL DATA:  Respiratory failure EXAM: PORTABLE CHEST 1 VIEW COMPARISON:  01/14/2016 FINDINGS: Central line endotracheal tube and orogastric tube appear unchanged. Mild cardiac silhouette enlargement stable. There is vascular congestion with indistinct perihilar vasculature bilaterally. Hazy density at both lung bases again identified. IMPRESSION:  Bilateral airspace disease is mildly improved when compared to prior study. Electronically Signed   By: Esperanza Heir M.D.   On: 01/15/2016 07:19     STUDIES:  4/4 Echo EF 60-65%, Mod LA dilated , MV severe stenosis , Gr 2 DD  4/23 CT Chest > Dependent airspace opacities in both lungs with associated volume loss, similar to recent radiographs. Although likely in part secondary to atelectasis, distribution is suspicious for infection, possibly on the basis of aspiration. Ground-glass opacities in the aerated lungs consistent with mild edema. Small bilateral pleural effusions.  CULTURES: Resp culture 4/21 > abundant staph aureus  Ucx 4/21 >not done  [facility] 4/22 > NG <24 hr C. Diff 4/23 > +Ag - Toxin  ANTIBIOTICS: Vanc 4/22 >>  Zosyn 4/22 >> 4/24 Ancef 4/24 >> Flagyl 4/24 >>  SIGNIFICANT EVENTS:  LINES/TUBES: ETT 4/21>  DISCUSSION: 54 yo woman with history of CHF and COPD who came in with respiratory distress and was emergently intubated on 4/21 due to difficulty maintaining O2 sats above 90 while on biPap.  ASSESSMENT / PLAN:  PULMONARY A: Acute hypoxic and hypercarbic respiratory failure secondary to CHF exacerbation  COPD 4/22 >pH 7.5   P:   -off sedation -SBT today -duonebs -ABX per ID sxn   CARDIOVASCULAR A:  HTn Mitral stenosis-echo 4/4 with severe stenosis  CHF with grade 2 diastolic dysfx- last echo 4/4 with EF 60-65% with grade 2 diastolic dysfunction Hypotension requiring fluid bolus 4/22  4/23 >-6.7 L Bal on lasix  , min elevated troponin ? Demand  P:  -continue mechanical ventilation, SBT -Cr improved today after lasix 80 IV bid yesterday with 3.3 L UOP -Continue Lasix 80 bid today -strict I & O  RENAL  A:   Renal Insufficiency (baseline 0.62 )  Hypokalemia  hypomag-resolved  hypophos-resolved  pulm edema hypernatremia P:   -Resume lasix 80 bid -repleted K -2.2 L free water deficit -Start free water 250mL q6hr -BMET this  afternoon  GASTROINTESTINAL A:   GERD P:   PPI Hold TF while weaning  HEMATOLOGIC A:   Anaemia DVT prev 4/23>hbg tr down 9.1>8.8>7.3-no act bleeding  P:  Sub q hep Cbc in am , follow wbc  INFECTIOUS A:  Fever ? HCAP -BC NG, Trach growing staph aureus UTI - E.Coli/Klebsiella - ancef sensitive 4/23 >PCT 1 >0.86 >0.58 , LA 3.4 >1.3  Diarrhea - C. Diff toxin neg, Ag pos P:   D/C Zosyn  Continue Vanc Start Ancef Start Flagyl CBC in am Follow cultures  ENDOCRINE A:   Hypothyroidism-TSH 5.4  T2Dm, most recent a1c of 6.7  HLD 4/23>BS tr up on TF  P:   -Cont Lantus 15 units daily -SSI -stop insulin drip  -monitor CBGs -synthroid-cont    NEUROLOGIC A:   Bipolar History of seizures- no reported seizure  depression  P:   RASS goal: 0 to -1 Continue depakote- level 28 , dose increased 4/22  Hold Xanax abilify Off sedation  FAMILY  - Updates: none  - Inter-disciplinary family meet or Palliative Care meeting due by:  4/28   Valentino NoseNathan Jullien Granquist, MD [facility] PGY-1 [phone]

## 2016-01-15 NOTE — Progress Notes (Signed)
Inpatient Diabetes Program Recommendations  AACE/ADA: New Consensus Statement on Inpatient Glycemic Control (2015)  Target Ranges:  Prepandial:   less than 140 mg/dL      Peak postprandial:   less than 180 mg/dL (1-2 hours)      Critically ill patients:  140 - 180 mg/dL   Review of Glycemic Control  Diabetes history: DM Type 2 Outpatient Diabetes medications: Lantus 10 units daily, Novolog 6-15 units tid with meals Current orders for Inpatient glycemic control: IV insulin drip per ICU glycemic Control orders, Lantus 15 units  Inpatient Diabetes Program Recommendations:  Noted patient received Lantus 15 units and IV insulin drip. Please note on transition from IV drip, patient will need tube feed coverage Novolog insulin 3-4 units (hold if tube feeding stopped) + Standard scale correction coverage 2-6 units q 4 hrs. May need to titrate Lantus insulin dose as needed.   Thank you, Billy FischerJudy E. Sabeen Piechocki, RN, MSN, CDE Inpatient Glycemic Control Team Team Pager [phone] (8am-5pm) 01/15/2016 11:43 AM

## 2016-01-15 NOTE — Progress Notes (Signed)
eLink Physician-Brief Progress Note Patient Name: Elizabeth BuryMary J Deschler DOB: 27-Dec-1960 MRN: [id number]   Date of Service  01/15/2016  HPI/Events of Note  Hypokalemia 2.8. Diuresis on hold.   eICU Interventions  1. KCl 40mEq VT x1 2. KCl 10mEq IV x4 runs     Intervention Category Intermediate Interventions: Electrolyte abnormality - evaluation and management  Lawanda CousinsJennings Erum Cercone 01/15/2016, 5:38 AM

## 2016-01-15 NOTE — Progress Notes (Signed)
Hospital Problem List     Principal Problem:   Acute on chronic respiratory failure (HCC) Active Problems:   Diabetes mellitus, insulin dependent (IDDM), uncontrolled (HCC)   HTN (hypertension)   COPD (chronic obstructive pulmonary disease) (HCC)   Physical deconditioning   GERD without esophagitis   Bipolar I disorder (HCC)   Acute on chronic diastolic CHF (congestive heart failure), NYHA class 1 (HCC)   Seizures (HCC)   Acute respiratory failure with hypoxia (HCC)   Respiratory failure (HCC)   Hyperlipidemia   Rheumatic mitral stenosis    Patient Profile:   Primary Cardiologist: Dr. Mare Ferrari  55 y/o F w/ PMH of Rheumatic MS (progressive since 08/2013 when diagnosed, most recently severe with mean gradient 20 mmHg), chronic diastolic CHF, DM, HTN, HLD, COPD (PRN O2), prior Tobacco abuse, Hypothyroidism, Bipolar disorder, admitted from inpatient rehab on 4/20 for acute hypoxic respiratory failure.   Subjective   Intubated. Opens her eyes and moves her extremities spontaneously.  Inpatient Medications    . antiseptic oral rinse  7 mL Mouth Rinse QID  . ARIPiprazole  5 mg Oral Daily  . aspirin EC  81 mg Oral Daily  .  ceFAZolin (ANCEF) IV  2 g Intravenous Q8H  . chlorhexidine gluconate (SAGE KIT)  15 mL Mouth Rinse BID  . feeding supplement (PRO-STAT SUGAR FREE 64)  60 mL Per Tube TID  . feeding supplement (VITAL HIGH PROTEIN)  1,000 mL Per Tube Q24H  . free water  250 mL Per Tube Q6H  . heparin  5,000 Units Subcutaneous Q8H  . insulin glargine  15 Units Subcutaneous Daily  . levothyroxine  125 mcg Per Tube QAC breakfast  . metronidazole  500 mg Intravenous Q8H  . pantoprazole sodium  40 mg Per Tube Daily  . simvastatin  10 mg Oral q1800  . sodium chloride flush  3 mL Intravenous Q12H  . sodium chloride flush  3 mL Intravenous Q12H  . valproic acid  500 mg Per Tube BID  . vancomycin  1,250 mg Intravenous Q12H    Vital Signs    Filed Vitals:   01/15/16 0953  01/15/16 1025 01/15/16 1030 01/15/16 1042  BP: 131/69 123/89 135/73 135/73  Pulse: 108 101 103 101  Temp:      TempSrc:      Resp: 29 30 32 26  Weight:      SpO2: 98% 99% 97% 100%    Intake/Output Summary (Last 24 hours) at 01/15/16 1126 Last data filed at 01/15/16 1022  Gross per 24 hour  Intake 2022.96 ml  Output   3300 ml  Net -1277.04 ml   Filed Weights   01/13/16 0410 01/14/16 0500 01/15/16 0417  Weight: 207 lb (93.895 kg) 231 lb (104.781 kg) 201 lb (91.173 kg)    Physical Exam    General: Obese Caucasian female, currently intubated. Head: Normocephalic, atraumatic.  Neck: Supple without bruits, JVD appears elevated, difficult to assess secondary to body habitus. Lungs:  Resp regular and unlabored, coarse breath sounds throughout. Heart: RRR, S1, S2, no S3, S4, 2/6 diastolic murmur best appreciated at Apex; no rub. Abdomen: Soft, non-tender, non-distended with normoactive bowel sounds. No hepatomegaly. No rebound/guarding. No obvious abdominal masses. Extremities: No clubbing, cyanosis, or edema. SCD's in place. Distal pedal pulses are 2+ bilaterally. Neuro: Alert and oriented X 3. Moves all extremities spontaneously. Psych: Normal affect.  Labs    CBC  Recent Labs  01/14/16 0625 01/15/16 0400  WBC 13.9* 14.7*  HGB  7.3* 7.5*  HCT 25.2* 26.0*  MCV 81.0 82.3  PLT 194 810   Basic Metabolic Panel  Recent Labs  01/14/16 0625 01/14/16 1420 01/15/16 0400  NA 144  --  152*  K 2.9*  --  2.8*  CL 92*  --  101  CO2 37*  --  39*  GLUCOSE 354*  --  139*  BUN 39*  --  38*  CREATININE 1.06*  --  0.86  CALCIUM 7.7*  --  8.2*  MG 2.1 2.2 2.0  PHOS 5.5* 4.6 3.2   Liver Function Tests No results for input(s): AST, ALT, ALKPHOS, BILITOT, PROT, ALBUMIN in the last 72 hours. No results for input(s): LIPASE, AMYLASE in the last 72 hours. Cardiac Enzymes  Recent Labs  01/13/16 1506 01/13/16 1800 01/14/16 0115  TROPONINI 0.10* 0.09* 0.09*   Thyroid Function  Tests  Recent Labs  01/13/16 1420  TSH 5.460*    Telemetry    NSR, HR in 80's - 90's. Occasional PVC's.  ECG    No new tracings.   Cardiac Studies and Radiology    Ct Chest Wo Contrast: 01/14/2016  CLINICAL DATA:  Acute respiratory distress requiring intubation 3 days ago. Worsening hypoxemia. EXAM: CT CHEST WITHOUT CONTRAST TECHNIQUE: Multidetector CT imaging of the chest was performed following the standard protocol without IV contrast. COMPARISON:  Radiographs 01/14/2016 and 01/13/2016.  CT 09/11/2013. FINDINGS: Mediastinum/Nodes: Small mediastinal and hilar lymph nodes bilaterally are similar to the prior CT. Hilar assessment is limited by the lack of intravenous contrastand perihilar pulmonary opacities.There is no axillary adenopathy. Endotracheal tube, nasogastric tube and right IJ central venous catheter are in place. There is mild cardiomegaly. No significant pericardial fluid. Mild atherosclerosis is present. Lungs/Pleura: Small bilateral pleural effusions. There are patchy airspace opacities dependently in both lungs with mild volume loss in the lower lobes. There are patchy ground-glass opacities throughout the aerated lungs. No endobronchial lesion or suspicious pulmonary nodule. Upper abdomen: The visualized upper abdomen demonstrates no suspicious findings. Musculoskeletal/Chest wall: There is mild generalized edema within the chest wall. No acute osseous findings are seen. There is stable chronic sternal deformity. There are postsurgical changes in the lower cervical spine and both proximal humeri. IMPRESSION: 1. Dependent airspace opacities in both lungs with associated volume loss, similar to recent radiographs. Although likely in part secondary to atelectasis, distribution is suspicious for infection, possibly on the basis of aspiration. 2. Ground-glass opacities in the aerated lungs consistent with mild edema. 3. Small bilateral pleural effusions. Electronically Signed   By:  Richardean Sale M.D.   On: 01/14/2016 17:14   Dg Chest Port 1 View: 01/15/2016  CLINICAL DATA:  Respiratory failure EXAM: PORTABLE CHEST 1 VIEW COMPARISON:  01/14/2016 FINDINGS: Central line endotracheal tube and orogastric tube appear unchanged. Mild cardiac silhouette enlargement stable. There is vascular congestion with indistinct perihilar vasculature bilaterally. Hazy density at both lung bases again identified. IMPRESSION: Bilateral airspace disease is mildly improved when compared to prior study. Electronically Signed   By: Skipper Cliche M.D.   On: 01/15/2016 07:19   Dg Chest Port 1 View: 01/14/2016  CLINICAL DATA:  Acute respiratory failure. On ventilator. Congestive heart failure. COPD. EXAM: PORTABLE CHEST 1 VIEW COMPARISON:  01/13/2016 FINDINGS: Patient is rotated to the right. Endotracheal tube, nasogastric tube, and right jugular central venous catheter remain in appropriate position. Cardiomegaly stable. Symmetric bilateral airspace disease with bibasilar predominance shows no significant change, most likely due to pulmonary edema or ARDS. No pneumothorax visualized. No  definite pleural effusion noted on this portable exam. IMPRESSION: Stable cardiomegaly and symmetric bilateral airspace disease, most likely due to pulmonary edema or ARDS. Electronically Signed   By: Earle Gell M.D.   On: 01/14/2016 08:32    Echocardiogram: 12/26/2015  Study Conclusions - Left ventricle: The cavity size was normal. Wall thickness was  normal. Systolic function was normal. The estimated ejection  fraction was in the range of 60% to 65%. Wall motion was normal;  there were no regional wall motion abnormalities. Features are  consistent with a pseudonormal left ventricular filling pattern,  with concomitant abnormal relaxation and increased filling  pressure (grade 2 diastolic dysfunction). - Mitral valve: Mobility was restricted. The findings are  consistent with severe stenosis. Valve area by  pressure  half-time: 1.98 cm^2. Valve area by continuity equation (using  LVOT flow): 0.78 cm^2. - Left atrium: The atrium was moderately dilated.  Impressions: - Technically difficult; normal LV function; grade 2 diastolic  dysfunction; moderate LAE; MV not well visualized but appears  thickened and possibly rheumatic; turbulence noted with MV  inflow; severe MS (mean gradient 20 mmHg; suggest TEE to further  assess if clinically indicated).  Assessment & Plan    1. Acute Hypoxic Respiratory Failure/ Acute on Chronic Diastolic CHF Exacerbation  - likely multifactorial in setting of Staph PNA (copious sputum & opacities noted on imaging), severe MS, and known diastolic dysfunction.  - overall -7.8L this admission. Has been receiving Lasix '80mg'$  BID. Creatinine stable at 0.86. Will continue with diuresis today. Continue to obtain daily weights and strict I&O's. - BB and ARB held secondary to hypotension.  2. Rheumatic MS  - severe by echo in 12/2015 with mean gradient of 20 mmHg.  - Will need to consider further workup for mitral valve replacement with TEE once her current medical condition improves.   3. HTN  - has been hypotensive with SBP in the 70's during the past 24 hours. - PTA Lopressor and Losartan currently held. Will resume once BP allows.  4. HLD  - continue statin therapy  5. Staph PNA/ UTI/ C.diff - per admitting team  6. Hypokalemia  - K+ at 2.8 on 01/15/2016. Being replaced this AM. - continue to monitor.  Arna Medici , PA-C 11:26 AM 01/15/2016 Pager: [phone]  I have personally seen and examined this patient with Bernerd Pho, PA-C. I agree with the assessment and plan as outlined above. [name] admitted with respiratory failure due to several factors including pneumonia, mitral stenosis and volume overload. [name] known to have chronic diastolic CHF and severe MS. [name] intubated in sinus rhythm on tele. I agree that we should  continue the current treatment plan for now. She has diuresed well. Chest x-ray improving. When respiratory status is improved and she is extubated, will consider right and left heart cath and CT surgery referral to address MS.   Shyasia Funches 01/15/2016 12:10 PM

## 2016-01-15 NOTE — Progress Notes (Signed)
Pharmacy Antibiotic Note  [name] is a 55 y.o. female admitted on 01/11/2016 with Staph pneumonia and Ecoli UTI.  Pharmacy has been consulted for cefazolin and vancomycin dosing. Patient with Tmax 100.7 (trending down). WBC up some, PCT and LA down. Flexiseal in place.   Plan: D/C Zosyn Start Ancef 2g IV every 8 hours. Continue Vancomycin 1250mg  IV every 12 hours.  Vancomycin trough today to confirm adequate treatment. Added Flagyl for c/o of Cdiff (Ag +, Toxin - but signs and symptoms concerning for infection)   Weight: 201 lb (91.173 kg)  Temp (24hrs), Avg:99.6 F (37.6 C), Min:99.1 F (37.3 C), Max:100.7 F (38.2 C)   Recent Labs Lab 01/10/16 0812 01/11/16 1843 01/12/16 0258 01/13/16 0159 01/13/16 1514 01/14/16 0115 01/14/16 0625 01/15/16 0400  WBC 10.6* 10.6* 9.9  --   --   --  13.9* 14.7*  CREATININE 0.62 0.74 0.68 0.92  --   --  1.06* 0.86  LATICACIDVEN  --   --   --   --  3.4* 1.3  --  1.4    Estimated Creatinine Clearance: 68.7 mL/min (by C-G formula based on Cr of 0.86).    Allergies  Allergen Reactions  . Trazodone And Nefazodone Anaphylaxis and Other (See Comments)    Shortness of breath.  . Celecoxib Nausea Only  . Ibuprofen Nausea Only    Antimicrobials this admission: Zosyn 4/22>>4/24 Ancef 4/24 >> Vancomycin 4/22>> Flagyl 4/24  Dose adjustments this admission: n/a  Microbiology results: 4/7 Cdiff negative 4/20 Urine 100 K ecoli, 40 k kleb 4/21 Sputum>> 4/22 Blood x 2>> 4/23 Cdiff ag + , toxin -  Thank you for allowing pharmacy to be a part of this patient's care.  Link SnufferJessica Shaleah Nissley, PharmD, BCPS Clinical Pharmacist [phone] 01/15/2016 9:58 AM

## 2016-01-15 NOTE — Progress Notes (Signed)
Nutrition Follow-up  DOCUMENTATION CODES:   Morbid obesity  INTERVENTION:    Increase Vital High Protein to new goal rate of 25 ml/h with Prostat 30 ml QID to provide 1000 kcals, 113 gm protein, 502 ml free water daily.  NUTRITION DIAGNOSIS:   Inadequate oral intake related to inability to eat as evidenced by NPO status.  Ongoing  GOAL:   Provide needs based on ASPEN/SCCM guidelines  Met  MONITOR:   Vent status, Labs, Weight trends, TF tolerance, Skin, I & O's  ASSESSMENT:   55 yo woman with history of CHF and COPD who came in with respiratory distress and was emergently intubated on 4/21 due to difficulty maintaining O2 sats above 90 while on biPap.  Discussed patient in ICU rounds and with RN today. Propofol has been discontinued, RD requested to increase TF to meet nutrition goals. Insulin drip to be d/c'ed, tube feeding coverage added. Labs reviewed: sodium elevated, potassium low. Phosphorus and magnesium WNL. Patient is currently intubated on ventilator support Temp (24hrs), Avg:99.6 F (37.6 C), Min:99.1 F (37.3 C), Max:100.7 F (38.2 C)   Diet Order:  Diet NPO time specified  Skin:  Wound (see comment) (stage II sacrum, MASD buttocks)  Last BM:  4/24  Height:   Ht Readings from Last 1 Encounters:  01/09/16 '4\' 9"'$  (1.448 m)    Weight:   Wt Readings from Last 1 Encounters:  01/15/16 201 lb (91.173 kg)    Ideal Body Weight:  43.8 kg  BMI:  Body mass index is 43.48 kg/(m^2).  Estimated Nutritional Needs:   Kcal:  [phone]  Protein:  >110 grams  Fluid:  > 1.5 L  EDUCATION NEEDS:   No education needs identified at this time  Molli Barrows, [place], [place], [place] Pager [phone] After Hours Pager [phone]

## 2016-01-16 ENCOUNTER — Inpatient Hospital Stay (HOSPITAL_COMMUNITY): Payer: Medicaid Other

## 2016-01-16 LAB — GLUCOSE, CAPILLARY
GLUCOSE-CAPILLARY: 217 mg/dL — AB (ref 65–99)
GLUCOSE-CAPILLARY: 270 mg/dL — AB (ref 65–99)
Glucose-Capillary: 148 mg/dL — ABNORMAL HIGH (ref 65–99)
Glucose-Capillary: 193 mg/dL — ABNORMAL HIGH (ref 65–99)
Glucose-Capillary: 218 mg/dL — ABNORMAL HIGH (ref 65–99)
Glucose-Capillary: 316 mg/dL — ABNORMAL HIGH (ref 65–99)

## 2016-01-16 LAB — BASIC METABOLIC PANEL
ANION GAP: 11 (ref 5–15)
ANION GAP: 8 (ref 5–15)
BUN: 24 mg/dL — ABNORMAL HIGH (ref 6–20)
BUN: 25 mg/dL — ABNORMAL HIGH (ref 6–20)
CALCIUM: 9.1 mg/dL (ref 8.9–10.3)
CALCIUM: 9.4 mg/dL (ref 8.9–10.3)
CHLORIDE: 104 mmol/L (ref 101–111)
CO2: 37 mmol/L — ABNORMAL HIGH (ref 22–32)
CO2: 41 mmol/L — AB (ref 22–32)
Chloride: 104 mmol/L (ref 101–111)
Creatinine, Ser: 0.63 mg/dL (ref 0.44–1.00)
Creatinine, Ser: 0.68 mg/dL (ref 0.44–1.00)
GFR calc non Af Amer: >60 mL/min (ref >60)
Glucose, Bld: 171 mg/dL — ABNORMAL HIGH (ref 65–99)
Glucose, Bld: 202 mg/dL — ABNORMAL HIGH (ref 65–99)
Potassium: 3.1 mmol/L — ABNORMAL LOW (ref 3.5–5.1)
Potassium: 3.4 mmol/L — ABNORMAL LOW (ref 3.5–5.1)
SODIUM: 152 mmol/L — AB (ref 135–145)
SODIUM: 153 mmol/L — AB (ref 135–145)

## 2016-01-16 LAB — CBC
HCT: 26.8 % — ABNORMAL LOW (ref 36.0–46.0)
HEMOGLOBIN: 7.6 g/dL — AB (ref 12.0–15.0)
MCH: 23.5 pg — ABNORMAL LOW (ref 26.0–34.0)
MCHC: 28.4 g/dL — ABNORMAL LOW (ref 30.0–36.0)
MCV: 82.7 fL (ref 78.0–100.0)
PLATELETS: 308 10*3/uL (ref 150–400)
RBC: 3.24 MIL/uL — AB (ref 3.87–5.11)
RDW: 18.5 % — ABNORMAL HIGH (ref 11.5–15.5)
WBC: 11.7 10*3/uL — AB (ref 4.0–10.5)

## 2016-01-16 LAB — CLOSTRIDIUM DIFFICILE BY PCR: Toxigenic C. Difficile by PCR: NEGATIVE

## 2016-01-16 LAB — LIPID PANEL
CHOL/HDL RATIO: 3.6 ratio
Cholesterol: 123 mg/dL (ref 0–200)
HDL: 34 mg/dL — ABNORMAL LOW (ref >40)
LDL Cholesterol: 66 mg/dL (ref 0–99)
TRIGLYCERIDES: 113 mg/dL (ref <150)
VLDL: 23 mg/dL (ref 0–40)

## 2016-01-16 MED ORDER — FUROSEMIDE 10 MG/ML IJ SOLN
INTRAMUSCULAR | Status: AC
  Filled 2016-01-16: qty 4

## 2016-01-16 MED ORDER — METOPROLOL TARTRATE 5 MG/5ML IV SOLN: 5.0000 mg | Freq: Four times a day (QID) | INTRAVENOUS | Status: DC

## 2016-01-16 MED ORDER — FREE WATER
200.0000 mL | Status: DC
  Administered 2016-01-16 – 2016-01-17 (×6): 200 mL

## 2016-01-16 MED ORDER — POTASSIUM CHLORIDE 20 MEQ/15ML (10%) PO SOLN
40.0000 meq | Freq: Once | ORAL | Status: AC
  Administered 2016-01-16: 40 meq
  Filled 2016-01-16: qty 30

## 2016-01-16 MED ORDER — METOPROLOL TARTRATE 5 MG/5ML IV SOLN
5.0000 mg | Freq: Four times a day (QID) | INTRAVENOUS | Status: DC
  Administered 2016-01-16 – 2016-01-24 (×30): 5 mg via INTRAVENOUS
  Filled 2016-01-16 (×32): qty 5

## 2016-01-16 MED ORDER — METOPROLOL TARTRATE 1 MG/ML IV SOLN
5.0000 mg | Freq: Four times a day (QID) | INTRAVENOUS | Status: DC
  Administered 2016-01-16: 5 mg via INTRAVENOUS
  Filled 2016-01-16 (×4): qty 5

## 2016-01-16 NOTE — Progress Notes (Signed)
Inpatient Diabetes Program Recommendations  AACE/ADA: New Consensus Statement on Inpatient Glycemic Control (2015)  Target Ranges:  Prepandial:   less than 140 mg/dL      Peak postprandial:   less than 180 mg/dL (1-2 hours)      Critically ill patients:  140 - 180 mg/dL   Review of Glycemic Control  Inpatient Diabetes Program Recommendations:  Insulin - Basal: increase Lantus to 20 units  Insulin - Meal Coverage: increase Novolog tube feed coverage to 4 units Q4 Thank you  Piedad ClimesGina Chrisette Man BSN, RN,CDE Inpatient Diabetes Coordinator [phone] (team pager)

## 2016-01-16 NOTE — Progress Notes (Signed)
    Subjective:  Awake, on ventilator. No hx obtainable but denies pain.  Objective:  Vital Signs in the last 24 hours: Temp:  [98.6 F (37 C)-99.7 F (37.6 C)] 99 F (37.2 C) (04/25 0743) Pulse Rate:  [80-125] 125 (04/25 0830) Resp:  [12-32] 31 (04/25 0830) BP: (109-168)/(57-108) 165/78 mmHg (04/25 0830) SpO2:  [91 %-100 %] 94 % (04/25 0830) FiO2 (%):  [40 %] 40 % (04/25 0736) Weight:  [200 lb (90.719 kg)] 200 lb (90.719 kg) (04/25 0313)  Intake/Output from previous day: 04/24 0701 - 04/25 0700 In: 2411.2 [I.V.:501.2; NG/GT:1260; IV Piggyback:650] Out: 3925 [Urine:3550; Stool:375]  Physical Exam: Pt is alert and awake, NAD on ventilator, trying to talk HEENT: normal Neck: JVP - normal Lungs: diffuse rhonchi bilaterally CV: tachy and regular no murmur Abd: soft, NT, Positive BS Ext: mild diffuse edema Skin: warm/dry no rash   Lab Results:  Recent Labs  01/15/16 0400 01/16/16 0400  WBC 14.7* 11.7*  HGB 7.5* 7.6*  PLT 228 308    Recent Labs  01/15/16 1732 01/16/16 0400  NA 153* 152*  K 3.3* 3.1*  CL 104 104  CO2 36* 37*  GLUCOSE 244* 171*  BUN 29* 24*  CREATININE 0.71 0.63    Recent Labs  01/13/16 1800 01/14/16 0115  TROPONINI 0.09* 0.09*    Cardiac Studies: 2D Echo: Study Conclusions  - Left ventricle: The cavity size was normal. Wall thickness was  normal. Systolic function was normal. The estimated ejection  fraction was in the range of 60% to 65%. Wall motion was normal;  there were no regional wall motion abnormalities. Features are  consistent with a pseudonormal left ventricular filling pattern,  with concomitant abnormal relaxation and increased filling  pressure (grade 2 diastolic dysfunction). - Mitral valve: Mobility was restricted. The findings are  consistent with severe stenosis. Valve area by pressure  half-time: 1.98 cm^2. Valve area by continuity equation (using  LVOT flow): 0.78 cm^2. - Left atrium: The atrium  was moderately dilated.  Impressions:  - Technically difficult; normal LV function; grade 2 diastolic  dysfunction; moderate LAE; MV not well visualized but appears  thickened and possibly rheumatic; turbulence noted with MV  inflow; severe MS (mean gradient 20 mmHg; suggest TEE to further  assess if clinically indicated).  Tele: Personally reviewed: sinus tachycardia  Assessment/Plan:  1. Acute respiratory failure, multifactorial 2. Acute diastolic heart failure with known severe mitral stenosis 3. Staph PNA/UTI/CDif 4. Hypokalemia 5. Hypernatremia 6. HTN, uncontrolled  Overall appears to be making slow progress. Respiratory failure seems related to both pneumonia and pulmonary edema (diastolic CHF related primarily to mitral stenosis). [name] tachycardic and hypertensive this am. Would benefit from longer filling time in the setting of severe MS. Will add metoprolol 5 mg IV every 6 hours. Continue IV lasix/free water per CCM team. Discussed with CCM. No family at bedside today.  Tonny Bollmanooper, Christipher Rieger, M.D. 01/16/2016, 9:29 AM

## 2016-01-16 NOTE — Progress Notes (Signed)
PULMONARY / CRITICAL CARE MEDICINE   Name: [name] MRN: [id number] DOB: December 23, 1960    ADMISSION DATE:  01/11/2016 CONSULTATION DATE:  4/21  REFERRING MD:  Dolphus JennyMclung, MD    HISTORY OF PRESENT ILLNESS:   This is a 55 yo F with COPD, CHF, diabetes, mitral stenosis secondary to rheumatic fever history, diabetes, bipolar, GERD, seizures, left shoulder fracture, who came in with acute restorative failure and acute encephalopathy.  Patient was recently admitted on 4/1 for AoCHF and COPDex. She had to be intubated, as she decompensated from respiratory standpoint as it was thought to be due to ARDS secondary to aspiration. She improved slowly and was transferred to to inpt rehab on 4/18.  She presented this time with acute respiratory failure secondary to acute diastolic congestive heart failure with last echo showing EF of 60% with grade 2 diastolic dysfunction. [name] on home lasix 40 mg BID , but was only taking 40 mg daily while on inpt rehab, so likely this contributed to the exacerbation. She was started on lasix, and on 4/21, rn was having difficulty keeping the patient's sats above 90 despite Bipap. Pt had to be emergently intubated, and PCCM was asked to take over.   Subjective :  Intubated, following commands   Hypertensive and tachycardic overnight Good response diuresis yesterday, Cr stable  VITAL SIGNS: BP 168/85 mmHg  Pulse 108  Temp(Src) 98.6 F (37 C) (Oral)  Resp 19  Wt 200 lb (90.719 kg)  SpO2 99%  HEMODYNAMICS:    VENTILATOR SETTINGS: Vent Mode:  [-] PSV;CPAP FiO2 (%):  [40 %] 40 % Set Rate:  [12 bmp] 12 bmp Vt Set:  [430 mL] 430 mL PEEP:  [5 cmH20-8 cmH20] 5 cmH20 Pressure Support:  [5 cmH20-10 cmH20] 10 cmH20 Plateau Pressure:  [21 cmH20-28 cmH20] 24 cmH20  INTAKE / OUTPUT: I/O last 3 completed shifts: In: 3225 [I.V.:650; NG/GT:1525; IV Piggyback:1050] Out: 4975 [Urine:4600; Stool:375]  PHYSICAL EXAMINATION: General:  Alert on vent Neuro: Pupils  reactive bilaterally, normocephalic  HEENT:  jvd noted Cardiovascular:  s1 s2 RRT murm Lungs:  Coarse BS , no wheezing  Abdomen:  Soft, obese, NT, ND Musculoskeletal: tr edema  Skin:  No rash  LABS:  BMET  Recent Labs Lab 01/15/16 0400 01/15/16 1732 01/16/16 0400  NA 152* 153* 152*  K 2.8* 3.3* 3.1*  CL 101 104 104  CO2 39* 36* 37*  BUN 38* 29* 24*  CREATININE 0.86 0.71 0.63  GLUCOSE 139* 244* 171*    Electrolytes  Recent Labs Lab 01/14/16 0625 01/14/16 1420 01/15/16 0400 01/15/16 1732 01/16/16 0400  CALCIUM 7.7*  --  8.2* 8.8* 9.1  MG 2.1 2.2 2.0  --   --   PHOS 5.5* 4.6 3.2  --   --     CBC  Recent Labs Lab 01/14/16 0625 01/15/16 0400 01/16/16 0400  WBC 13.9* 14.7* 11.7*  HGB 7.3* 7.5* 7.6*  HCT 25.2* 26.0* 26.8*  PLT 194 228 308    Coag's No results for input(s): APTT, INR in the last 168 hours.  Sepsis Markers  Recent Labs Lab 01/13/16 1420 01/13/16 1514 01/14/16 0115 01/14/16 0625 01/15/16 0400  LATICACIDVEN  --  3.4* 1.3  --  1.4  PROCALCITON 1.01  --   --  0.86 0.58    ABG  Recent Labs Lab 01/11/16 2030 01/12/16 1117 01/13/16 1458  PHART 7.502* 7.567* 7.571*  PCO2ART 61.0* 53.5* 44.2  PO2ART 68.9* 227.0* 79.5*    Liver Enzymes  Recent Labs Lab 01/10/16 0812 01/11/16 1843 01/12/16 0258  AST 15 14* 11*  ALT 16 17 13*  ALKPHOS 81 86 72  BILITOT 1.3* 1.4* 1.6*  ALBUMIN 3.4* 3.2* 2.6*    Cardiac Enzymes  Recent Labs Lab 01/13/16 1506 01/13/16 1800 01/14/16 0115  TROPONINI 0.10* 0.09* 0.09*    Glucose  Recent Labs Lab 01/15/16 1008 01/15/16 1127 01/15/16 1506 01/15/16 2019 01/16/16 0003 01/16/16 0338  GLUCAP 258* 194* 173* 269* 218* 148*    Imaging Dg Chest Port 1 View  01/16/2016  CLINICAL DATA:  Pneumonia EXAM: PORTABLE CHEST 1 VIEW COMPARISON:  January 15, 2016 FINDINGS: The heart size and mediastinal contours are stable. The heart size is enlarged. Endotracheal tube is identified distal tip 5 cm  from carina. Right central venous line is noted distal tip in the superior vena cava. Nasogastric tube is identified distal tip is not included on film but is at least in the stomach. There is persistent pulmonary vascular congestion with enlargement of perihilar central pulmonary vessels unchanged. Mild hazy opacity of bilateral lung bases are identified unchanged. There is no pleural effusion. The visualized skeletal structures are stable. IMPRESSION: Mild central pulmonary vascular congestion unchanged compared to prior exam. Mild hazy airspace disease in bilateral lung bases not significantly changed compared to prior exam. Electronically Signed   By: Sherian Rein M.D.   On: 01/16/2016 07:22    STUDIES:  4/4 Echo EF 60-65%, Mod LA dilated , MV severe stenosis , Gr 2 DD  4/23 CT Chest > Dependent airspace opacities in both lungs with associated volume loss, similar to recent radiographs. Although likely in part secondary to atelectasis, distribution is suspicious for infection, possibly on the basis of aspiration. Ground-glass opacities in the aerated lungs consistent with mild edema. Small bilateral pleural effusions.  CULTURES: Resp culture 4/21 > abundant staph aureus, MSSA  Ucx 4/20 > E.Coli/Klebsiella, sensitives reported [facility] 4/22 > NG 2 days C. Diff 4/23 > +Ag - Toxin  ANTIBIOTICS: Vanc 4/22 >> 4/24 Zosyn 4/22 >> 4/24 Ancef 4/24 >> Flagyl 4/24 >>  SIGNIFICANT EVENTS:  LINES/TUBES: ETT 4/21>  DISCUSSION: 55 yo woman with history of CHF and COPD who came in with respiratory distress and was emergently intubated on 4/21 due to difficulty maintaining O2 sats above 90 while on biPap.  ASSESSMENT / PLAN:  PULMONARY A: Acute hypoxic and hypercarbic respiratory failure secondary to CHF exacerbation  COPD 4/22 >pH 7.5  P:   -off sedation -SBT today, possible extubation tomorrow -duonebs -ABX per ID sxn   CARDIOVASCULAR A:  HTN Mitral stenosis-echo 4/4 with severe stenosis   CHF with grade 2 diastolic dysfx- last echo 4/4 with EF 60-65% with grade 2 diastolic dysfunction Hypotension requiring fluid bolus 4/22  4/23 >-6.7 L Bal on lasix  , min elevated troponin ? Demand  Tachycardia P:  -continue mechanical ventilation, SBT -Cr stable, 3.5 UOP -Continue lasix 80 IV bid -Continue Lasix 80 bid today -strict I & O -Start metoprolol per cards  RENAL A:   Renal Insufficiency (baseline 0.62 )  Hypokalemia  hypomag-resolved  hypophos-resolved  pulm edema hypernatremia P:   -Continuelasix 80 bid -repleted K -increase free water -BMET this afternoon  GASTROINTESTINAL A:   GERD P:   PPI Continue TF  HEMATOLOGIC A:   Anaemia DVT prev 4/23>hbg tr down 9.1>8.8>7.3-no act bleeding - stable P:  Sub q hep Cbc in am , follow wbc  INFECTIOUS A:  Fever ? HCAP -BC NG, Trach growing MSSA  UTI - E.Coli/Klebsiella - ancef sensitive 4/23 >PCT 1 >0.86 >0.58 , LA 3.4 >1.3  Diarrhea - C. 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

## 2016-01-17 ENCOUNTER — Inpatient Hospital Stay (HOSPITAL_COMMUNITY): Payer: Medicaid Other

## 2016-01-17 ENCOUNTER — Ambulatory Visit: Payer: Self-pay | Admitting: Nurse Practitioner

## 2016-01-17 LAB — BASIC METABOLIC PANEL
Anion gap: 13 (ref 5–15)
Anion gap: 16 — ABNORMAL HIGH (ref 5–15)
BUN: 31 mg/dL — ABNORMAL HIGH (ref 6–20)
BUN: 26 mg/dL — AB (ref 6–20)
CALCIUM: 9.4 mg/dL (ref 8.9–10.3)
CHLORIDE: 98 mmol/L — AB (ref 101–111)
CO2: 39 mmol/L — ABNORMAL HIGH (ref 22–32)
CO2: 36 mmol/L — ABNORMAL HIGH (ref 22–32)
CREATININE: 0.69 mg/dL (ref 0.44–1.00)
CREATININE: 0.86 mg/dL (ref 0.44–1.00)
Calcium: 9.2 mg/dL (ref 8.9–10.3)
Chloride: 101 mmol/L (ref 101–111)
GFR calc Af Amer: >60 mL/min (ref >60)
GFR calc non Af Amer: >60 mL/min (ref >60)
Glucose, Bld: 172 mg/dL — ABNORMAL HIGH (ref 65–99)
Glucose, Bld: 413 mg/dL — ABNORMAL HIGH (ref 65–99)
Potassium: 3 mmol/L — ABNORMAL LOW (ref 3.5–5.1)
Potassium: 3.3 mmol/L — ABNORMAL LOW (ref 3.5–5.1)
SODIUM: 153 mmol/L — AB (ref 135–145)
SODIUM: 150 mmol/L — AB (ref 135–145)

## 2016-01-17 LAB — GLUCOSE, CAPILLARY
GLUCOSE-CAPILLARY: 109 mg/dL — AB (ref 65–99)
GLUCOSE-CAPILLARY: 171 mg/dL — AB (ref 65–99)
GLUCOSE-CAPILLARY: 202 mg/dL — AB (ref 65–99)
GLUCOSE-CAPILLARY: 207 mg/dL — AB (ref 65–99)
GLUCOSE-CAPILLARY: 372 mg/dL — AB (ref 65–99)
Glucose-Capillary: 275 mg/dL — ABNORMAL HIGH (ref 65–99)
Glucose-Capillary: 68 mg/dL (ref 65–99)
Glucose-Capillary: 67 mg/dL (ref 65–99)

## 2016-01-17 LAB — CBC
HCT: 29.8 % — ABNORMAL LOW (ref 36.0–46.0)
Hemoglobin: 8.6 g/dL — ABNORMAL LOW (ref 12.0–15.0)
MCH: 23.5 pg — AB (ref 26.0–34.0)
MCHC: 28.9 g/dL — ABNORMAL LOW (ref 30.0–36.0)
MCV: 81.4 fL (ref 78.0–100.0)
PLATELETS: 307 10*3/uL (ref 150–400)
RBC: 3.66 MIL/uL — AB (ref 3.87–5.11)
RDW: 18.3 % — ABNORMAL HIGH (ref 11.5–15.5)
WBC: 10 10*3/uL (ref 4.0–10.5)

## 2016-01-17 MED ORDER — ALPRAZOLAM 0.5 MG PO TABS: 0.5000 mg | ORAL_TABLET | Freq: Two times a day (BID) | ORAL | Status: DC

## 2016-01-17 MED ORDER — DEXTROSE 5 % IV SOLN: INTRAVENOUS | Status: DC

## 2016-01-17 MED ORDER — LORAZEPAM 2 MG/ML IJ SOLN: 0.5000 mg | Freq: Two times a day (BID) | INTRAMUSCULAR | Status: DC

## 2016-01-17 MED ORDER — DEXTROSE 50 % IV SOLN
INTRAVENOUS | Status: AC
  Administered 2016-01-17: 50 mL
  Filled 2016-01-17: qty 50

## 2016-01-17 MED ORDER — ASPIRIN 81 MG PO CHEW
81.0000 mg | CHEWABLE_TABLET | Freq: Every day | ORAL | Status: DC
  Administered 2016-01-19 – 2016-01-26 (×7): 81 mg via ORAL
  Filled 2016-01-17 (×8): qty 1

## 2016-01-17 MED ORDER — LORAZEPAM 2 MG/ML IJ SOLN
0.5000 mg | Freq: Once | INTRAMUSCULAR | Status: AC
  Administered 2016-01-18: 0.5 mg via INTRAVENOUS
  Filled 2016-01-17: qty 1

## 2016-01-17 MED ORDER — SODIUM CHLORIDE 0.45 % IV SOLN
INTRAVENOUS | Status: AC
  Administered 2016-01-17 – 2016-01-19 (×3): via INTRAVENOUS

## 2016-01-17 MED ORDER — POTASSIUM CHLORIDE 20 MEQ/15ML (10%) PO SOLN
30.0000 meq | ORAL | Status: AC
  Administered 2016-01-17 (×2): 30 meq
  Filled 2016-01-17 (×2): qty 30

## 2016-01-17 NOTE — Progress Notes (Signed)
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  Patient was recently admitted on 4/1 for AoCHF and COPDex. She had to be intubated, as she decompensated from respiratory standpoint as it was thought to be due to ARDS secondary to aspiration. She improved slowly and was transferred to to inpt rehab on 4/18.  She presented this time with acute respiratory failure secondary to acute diastolic congestive heart failure with last echo showing EF of 60% with grade 2 diastolic dysfunction. [name] on home lasix 40 mg BID , but was only taking 40 mg daily while on inpt rehab, so likely this contributed to the exacerbation. She was started on lasix, and on 4/21, rn was having difficulty keeping the patient's sats above 90 despite Bipap. Pt had to be emergently intubated, and PCCM was asked to take over.   Subjective :  Intubated, following commands   No events overnight  VITAL SIGNS: BP 140/77 mmHg  Pulse 95  Temp(Src) 98.9 F (37.2 C) (Oral)  Resp 12  Wt 204 lb (92.534 kg)  SpO2 100%  HEMODYNAMICS:    VENTILATOR SETTINGS: Vent Mode:  [-] PSV;CPAP FiO2 (%):  [40 %] 40 % Set Rate:  [12 bmp] 12 bmp Vt Set:  [430 mL] 430 mL PEEP:  [5 cmH20] 5 cmH20 Pressure Support:  [5 cmH20-10 cmH20] 5 cmH20 Plateau Pressure:  [18 cmH20-24 cmH20] 22 cmH20  INTAKE / OUTPUT: I/O last 3 completed shifts: In: 3060 [I.V.:580; NG/GT:1580; IV Piggyback:900] Out: 4850 [Urine:4525; Stool:325]  PHYSICAL EXAMINATION: General:  Alert on vent Neuro: Pupils reactive bilaterally, normocephalic  HEENT:  jvd normal Cardiovascular:  s1  s2 RRT murm Lungs:  Coarse BS , no wheezing  Abdomen:  Soft, obese, NT, ND Musculoskeletal: tr edema  Skin:  No rash  LABS:  BMET  Recent Labs Lab 01/16/16 0400 01/16/16 1506 01/17/16 0230  NA 152* 153* 153*  K 3.1* 3.4* 3.0*  CL 104 104 101  CO2 37* 41* 39*  BUN 24* 25* 31*  CREATININE 0.63 0.68 0.69  GLUCOSE 171* 202* 172*    Electrolytes  Recent Labs Lab 01/14/16 0625 01/14/16 1420 01/15/16 0400  01/16/16 0400 01/16/16 1506 01/17/16 0230  CALCIUM 7.7*  --  8.2*  < > 9.1 9.4 9.4  MG 2.1 2.2 2.0  --   --   --   --   PHOS 5.5* 4.6 3.2  --   --   --   --   < > = values in this interval not displayed.  CBC  Recent Labs Lab 01/15/16 0400 01/16/16 0400 01/17/16 0230  WBC 14.7* 11.7* 10.0  HGB 7.5* 7.6* 8.6*  HCT 26.0* 26.8* 29.8*  PLT 228 308 307    Coag's No results for input(s): APTT, INR in the last 168 hours.  Sepsis Markers  Recent Labs Lab 01/13/16 1420 01/13/16 1514 01/14/16 0115 01/14/16 0625 01/15/16 0400  LATICACIDVEN  --  3.4* 1.3  --  1.4  PROCALCITON 1.01  --   --  0.86 0.58    ABG  Recent Labs Lab 01/11/16 2030 01/12/16 1117 01/13/16 1458  PHART  7.502* 7.567* 7.571*  PCO2ART 61.0* 53.5* 44.2  PO2ART 68.9* 227.0* 79.5*    Liver Enzymes  Recent Labs Lab 01/11/16 1843 01/12/16 0258  AST 14* 11*  ALT 17 13*  ALKPHOS 86 72  BILITOT 1.4* 1.6*  ALBUMIN 3.2* 2.6*    Cardiac Enzymes  Recent Labs Lab 01/13/16 1506 01/13/16 1800 01/14/16 0115  TROPONINI 0.10* 0.09* 0.09*    Glucose  Recent Labs Lab 01/16/16 1133 01/16/16 1525 01/16/16 1934 01/17/16 0017 01/17/16 0408 01/17/16 0759  GLUCAP 270* 193* 316* 202* 109* 207*    Imaging Dg Chest Port 1 View  01/17/2016  CLINICAL DATA:  CHF. EXAM: PORTABLE CHEST 1 VIEW COMPARISON:  01/16/2016. FINDINGS: Interim removal right IJ line. Endotracheal tube, NG tube in stable position . Cardiomegaly. Persistent but improving bilateral patchy pulmonary infiltrates  and/or edema. No pleural effusion or pneumothorax. Prior cervical spine fusion. Postsurgical changes both shoulders. IMPRESSION: 1. Stable cardiomegaly. 2. Persistent but slightly improving patchy bilateral pulmonary infiltrates and/or pulmonary edema. Electronically Signed   By: Maisie Fus  Register   On: 01/17/2016 07:23    STUDIES:  4/4 Echo EF 60-65%, Mod LA dilated , MV severe stenosis , Gr 2 DD  4/23 CT Chest > Dependent airspace opacities in both lungs with associated volume loss, similar to recent radiographs. Although likely in part secondary to atelectasis, distribution is suspicious for infection, possibly on the basis of aspiration. Ground-glass opacities in the aerated lungs consistent with mild edema. Small bilateral pleural effusions.  CULTURES: Resp culture 4/21 > abundant staph aureus, MSSA  Ucx 4/20 > E.Coli/Klebsiella, sensitives reported [facility] 4/22 > NG 2 days C. Diff 4/23 > +Ag - Toxin  ANTIBIOTICS: Vanc 4/22 >> 4/24 Zosyn 4/22 >> 4/24 Ancef 4/24 >> Flagyl 4/24 >>  SIGNIFICANT EVENTS: 4/26 extubated  LINES/TUBES: ETT 4/21>4/26  DISCUSSION: 55 yo woman with history of CHF and COPD who came in with respiratory distress and was emergently intubated on 4/21 due to difficulty maintaining O2 sats above 90 while on biPap.  ASSESSMENT / PLAN:  PULMONARY A: Acute hypoxic and hypercarbic respiratory failure secondary to CHF exacerbation  COPD 4/22 >pH 7.5  P:   -Extubated today -duonebs prn -ABX per ID sxn   CARDIOVASCULAR A:  HTN Mitral stenosis-echo 4/4 with severe stenosis  CHF with grade 2 diastolic dysfx- last echo 4/4 with EF 60-65% with grade 2 diastolic dysfunction Hypotension requiring fluid bolus 4/22  4/23 >-6.7 L Bal on lasix  , min elevated troponin ? Demand  Tachycardia - improved P:  -Cr stable, 3.0 UOP -Continue lasix 80 IV bid -strict I & O -Continue metoprolol per cards  RENAL A:   Renal Insufficiency (baseline 0.62 )  Hypokalemia   hypomag-resolved  hypophos-resolved  pulm edema Hypernatremia - persistent  P:   -Continue lasix 80 bid -repleted K -HyperNa not improving, Start 1/2 NS 50 cc/hr -recheck BMET this afternoon and adjust fluids   GASTROINTESTINAL A:   GERD P:   PPI  HEMATOLOGIC A:   Anaemia DVT prev 4/23>hbg tr down 9.1>8.8>7.3-no act bleeding - stable P:  Sub q hep Cbc in am , follow wbc  INFECTIOUS A:  Fever ? HCAP -BC NG, Trach growing MSSA UTI - E.Coli/Klebsiella - ancef sensitive 4/23 >PCT 1 >0.86 >0.58 , LA 3.4 >1.3  Diarrhea - C. Diff toxin neg, Ag pos P:   Continue Ancef Continued Flagyl CBC in am  ENDOCRINE A:   Hypothyroidism-TSH 5.4  T2Dm, most recent a1c of 6.7  HLD 4/23>BS  tr up on TF  P:   -Cont Lantus 15 units daily -SSI -monitor CBGs -synthroid-cont   NEUROLOGIC A:   Bipolar History of seizures- no reported seizure  depression P:   Continue depakote- level 28 , dose increased 4/22  Restart Xanax abilify  FAMILY  - Updates: none  - Inter-disciplinary family meet or Palliative Care meeting due by:  4/28   Valentino Nose, MD IMT[facility] PGY-1 [phone]

## 2016-01-17 NOTE — Progress Notes (Signed)
SUBJECTIVE: No pain.   Tele: sinus  BP 128/66 mmHg  Pulse 86  Temp(Src) 99.3 F (37.4 C) (Oral)  Resp 24  Wt 204 lb (92.534 kg)  SpO2 100%  Intake/Output Summary (Last 24 hours) at 01/17/16 1412 Last data filed at 01/17/16 1301  Gross per 24 hour  Intake 1800.5 ml  Output   3925 ml  Net -2124.5 ml    PHYSICAL EXAM General: Well developed, well nourished, in no acute distress. Alert and oriented x 3.  Psych:  Good affect, responds appropriately Neck: No JVD. No masses noted.  Lungs: Clear bilaterally with no wheezes or rhonci noted.  Heart: RRR with diastolic murmur noted. Abdomen: Bowel sounds are present. Soft, non-tender.  Extremities: No lower extremity edema.   LABS: Basic Metabolic Panel:  Recent Labs  01/14/16 1420 01/15/16 0400  01/16/16 1506 01/17/16 0230  NA  --  152*  < > 153* 153*  K  --  2.8*  < > 3.4* 3.0*  CL  --  101  < > 104 101  CO2  --  39*  < > 41* 39*  GLUCOSE  --  139*  < > 202* 172*  BUN  --  38*  < > 25* 31*  CREATININE  --  0.86  < > 0.68 0.69  CALCIUM  --  8.2*  < > 9.4 9.4  MG 2.2 2.0  --   --   --   PHOS 4.6 3.2  --   --   --   < > = values in this interval not displayed. CBC:  Recent Labs  01/16/16 0400 01/17/16 0230  WBC 11.7* 10.0  HGB 7.6* 8.6*  HCT 26.8* 29.8*  MCV 82.7 81.4  PLT 308 307   Fasting Lipid Panel:  Recent Labs  01/16/16 0400  CHOL 123  HDL 34*  LDLCALC 66  TRIG 113  CHOLHDL 3.6    Current Meds: . ALPRAZolam  0.5 mg Oral BID  . antiseptic oral rinse  7 mL Mouth Rinse QID  . ARIPiprazole  5 mg Oral Daily  . [START ON 01/18/2016] aspirin  81 mg Oral Daily  .  ceFAZolin (ANCEF) IV  2 g Intravenous Q8H  . chlorhexidine gluconate (SAGE KIT)  15 mL Mouth Rinse BID  . furosemide  80 mg Intravenous BID  . heparin  5,000 Units Subcutaneous Q8H  . insulin aspart  0-20 Units Subcutaneous Q4H  . insulin glargine  15 Units Subcutaneous Daily  . levothyroxine  125 mcg Per Tube QAC breakfast  .  metoprolol  5 mg Intravenous Q6H  . metronidazole  500 mg Intravenous Q8H  . pantoprazole sodium  40 mg Per Tube Daily  . simvastatin  10 mg Oral q1800  . sodium chloride flush  3 mL Intravenous Q12H  . sodium chloride flush  3 mL Intravenous Q12H  . valproic acid  500 mg Per Tube BID     ASSESSMENT AND PLAN:  1. Acute Hypoxic Respiratory Failure/ Acute on Chronic Diastolic CHF Exacerbation: Likely secondary to pneumonia and volume overload from diast CHF and MS. -12L this admission. She has been extubated but still volume overloaded. Continue Lasix '80mg'$  IV BID today. Beta blocker restarted.   2. 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 Will need to consider further workup for mitral valve replacement with TEE once her current medical condition improves.   3. HTN: BP stable.     MCALHANY,CHRISTOPHER  4/26/20172:12 PM

## 2016-01-17 NOTE — Progress Notes (Signed)
[facility] FOR AM LAB REPLACEMENT ONLY  The patient does apply for the [facility] Electrolyte [facility] Protocol based on the criteria listed below:   1. Is GFR >/= 40 ml/min? Yes.    Patient's GFR today is >60 2. Is urine output >/= 0.5 ml/kg/hr for the last 6 hours? Yes.   Patient's UOP is 0.77 ml/kg/hr 3. Is BUN < 60 mg/dL? Yes.    Patient's BUN today is 31 4. Abnormal electrolyte(s): Potassium 3.0 5. Ordered repletion with: Potassium per protocol 6. If a panic level lab has been reported, has the CCM MD in charge been notified? No..   Physician:  Schuyler AmorSommer  Lynnda Wiersma P 01/17/2016 5:08 AM

## 2016-01-17 NOTE — Progress Notes (Signed)
Hypoglycemic Event  CBG: 67   Treatment: D50 IV 50 mL  Symptoms: Hungry  Follow-up CBG: Time: 1555 CBG Result: 171  Possible Reasons for Event: Inadequate meal intake  Comments/MD notified: Dr. Alvia GroveMannem present on unit and aware of CBG results     [name] M

## 2016-01-17 NOTE — Procedures (Signed)
Extubation Procedure Note  Patient Details:   Name: [name] DOB: 1961/07/06 MRN: [id number]   Pt extubated to 4L [place] per MD order. Pt able to vocalize, no stridor noted, VS WNL. RT will continue to monitor.     Evaluation  O2 sats: stable throughout Complications: No apparent complications Patient did tolerate procedure well. Bilateral Breath Sounds: Rhonchi   Yes  Harley HallmarkKeen, Burke Terry Lyman 01/17/2016, 10:35 AM

## 2016-01-18 ENCOUNTER — Encounter (HOSPITAL_COMMUNITY): Payer: Self-pay

## 2016-01-18 ENCOUNTER — Inpatient Hospital Stay (HOSPITAL_COMMUNITY): Payer: Medicaid Other

## 2016-01-18 LAB — GLUCOSE, CAPILLARY
GLUCOSE-CAPILLARY: 226 mg/dL — AB (ref 65–99)
GLUCOSE-CAPILLARY: 113 mg/dL — AB (ref 65–99)
Glucose-Capillary: 119 mg/dL — ABNORMAL HIGH (ref 65–99)
Glucose-Capillary: 169 mg/dL — ABNORMAL HIGH (ref 65–99)
Glucose-Capillary: 174 mg/dL — ABNORMAL HIGH (ref 65–99)
Glucose-Capillary: 338 mg/dL — ABNORMAL HIGH (ref 65–99)

## 2016-01-18 LAB — BASIC METABOLIC PANEL
ANION GAP: 14 (ref 5–15)
BUN: 28 mg/dL — ABNORMAL HIGH (ref 6–20)
CHLORIDE: 98 mmol/L — AB (ref 101–111)
CO2: 38 mmol/L — AB (ref 22–32)
Calcium: 9.3 mg/dL (ref 8.9–10.3)
Creatinine, Ser: 0.79 mg/dL (ref 0.44–1.00)
GFR calc Af Amer: >60 mL/min (ref >60)
GLUCOSE: 341 mg/dL — AB (ref 65–99)
POTASSIUM: 3.6 mmol/L (ref 3.5–5.1)
Sodium: 150 mmol/L — ABNORMAL HIGH (ref 135–145)

## 2016-01-18 LAB — CULTURE, BLOOD (ROUTINE X 2)
CULTURE: NO GROWTH
CULTURE: NO GROWTH

## 2016-01-18 MED ORDER — CETYLPYRIDINIUM CHLORIDE 0.05 % MT LIQD
7.0000 mL | Freq: Two times a day (BID) | OROMUCOSAL | Status: DC
  Administered 2016-01-19 – 2016-01-24 (×10): 7 mL via OROMUCOSAL

## 2016-01-18 MED ORDER — INSULIN ASPART 100 UNIT/ML ~~LOC~~ SOLN
0.0000 [IU] | Freq: Three times a day (TID) | SUBCUTANEOUS | Status: DC
  Administered 2016-01-19: 5 [IU] via SUBCUTANEOUS
  Administered 2016-01-19: 7 [IU] via SUBCUTANEOUS
  Administered 2016-01-20: 9 [IU] via SUBCUTANEOUS

## 2016-01-18 MED ORDER — PANTOPRAZOLE SODIUM 40 MG IV SOLR
40.0000 mg | Freq: Every day | INTRAVENOUS | Status: DC
  Administered 2016-01-18 – 2016-01-20 (×3): 40 mg via INTRAVENOUS
  Filled 2016-01-18 (×3): qty 40

## 2016-01-18 MED ORDER — CHLORHEXIDINE GLUCONATE 0.12 % MT SOLN
15.0000 mL | Freq: Two times a day (BID) | OROMUCOSAL | Status: DC
  Administered 2016-01-18 – 2016-01-26 (×14): 15 mL via OROMUCOSAL
  Filled 2016-01-18 (×14): qty 15

## 2016-01-18 MED ORDER — LEVOTHYROXINE SODIUM 100 MCG IV SOLR
62.5000 ug | Freq: Every day | INTRAVENOUS | Status: DC
  Administered 2016-01-18 – 2016-01-21 (×4): 62.5 ug via INTRAVENOUS
  Filled 2016-01-18 (×4): qty 5

## 2016-01-18 NOTE — Progress Notes (Signed)
NURSING PROGRESS NOTE  Evaristo BuryMary J Beam [id number] Transfer Data: 01/18/2016 4:24 PM Attending Provider: Chilton GreathousePraveen Mannam, MD NWG:NFAOZPCP:Edwin Kathlynn GrateA Avbuere, MD Code Status: FULL  Evaristo BuryMary J Giraldo is a 55 y.o. female patient transferred from 58M -No acute distress noted.  -No complaints of shortness of breath.  -No complaints of chest pain.   Cardiac Monitoring: Box # 21 in place. Cardiac monitor yields:normal sinus rhythm.  Last Documented Vital Signs: Blood pressure 133/68, pulse 80, temperature 99.6 F (37.6 C), temperature source Oral, resp. rate 17, weight 86.183 kg (190 lb), SpO2 100 %.  IV Fluids:  IV in place, occlusive dsg intact without redness, IV cath antecubital left, condition patent and no redness normal saline.   Allergies:  Trazodone and nefazodone; Celecoxib; and Ibuprofen  Past Medical History:   has a past medical history of Hypertension; Anemia; Chronic back pain; Neuropathy (HCC); Hypercholesterolemia; Major depressive disorder, recurrent (HCC); Generalized anxiety disorder; Panic disorder with agoraphobia; Tobacco abuse; MI (myocardial infarction) (HCC) (2007); Type II diabetes mellitus (HCC); Heart murmur; Emphysema of lung (HCC); Pneumonia (1960's X 2); On home oxygen therapy; Thin blood (HCC); History of blood transfusion; GERD (gastroesophageal reflux disease); Arthritis; Bipolar disorder (HCC); Rheumatic fever; Mitral stenosis; COPD (chronic obstructive pulmonary disease) (HCC); Hypothyroidism; Chronic back pain; PONV (postoperative nausea and vomiting); Seizures (HCC); CHF (congestive heart failure) (HCC); Fracture of right humerus (04/13/2015); Left patella fracture (04/12/2015); Shoulder fracture, right (04/12/2015); and Shortness of breath dyspnea.  Past Surgical History:   has past surgical history that includes Foot surgery (Bilateral); Back surgery (X 3); Shoulder surgery (Left); Patella fracture surgery (Left, ~ 1999); Colon surgery; Tonsillectomy and adenoidectomy; Inguinal  hernia repair (Right); Knee surgery (Left, ~ 2011); ORIF ankle fracture (Left); Tubal ligation; and ORIF humerus fracture (Right, 10/12/2015).  Social History:   reports that she quit smoking about 3 years ago. Her smoking use included Cigarettes. She has a 20 pack-year smoking history. She has never used smokeless tobacco. She reports that she drinks alcohol. She reports that she uses illicit drugs ("Crack" cocaine).  Skin: with generalized bruising, MSAD to perineum. Stage 2 to sacrum, and wounds to back of bilateral ears from [place] tubing.  Patient/Family orientated to room. Information packet given to patient/family. Admission inpatient armband information verified with patient/family to include name and date of birth and placed on patient arm. Side rails up x 2, fall assessment and education completed with patient/family. Patient/family able to verbalize understanding of risk associated with falls and verbalized understanding to call for assistance before getting out of bed. Call light within reach. Patient/family able to voice and demonstrate understanding of unit orientation instructions.    Will continue to evaluate and treat per MD orders.

## 2016-01-18 NOTE — Progress Notes (Signed)
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  Patient was recently admitted on 4/1 for AoCHF and COPDex. She had to be intubated, as she decompensated from respiratory standpoint as it was thought to be due to ARDS secondary to aspiration. She improved slowly and was transferred to to inpt rehab on 4/18.  She presented this time with acute respiratory failure secondary to acute diastolic congestive heart failure with last echo showing EF of 60% with grade 2 diastolic dysfunction. [name] on home lasix 40 mg BID , but was only taking 40 mg daily while on inpt rehab, so likely this contributed to the exacerbation. She was started on lasix, and on 4/21, rn was having difficulty keeping the patient's sats above 90 despite Bipap. Pt had to be emergently intubated, and PCCM was asked to take over.   Subjective :  Doing well post-extubation yesterday. Only complaint this morning is dry mouth. On 2L [place] overnight with good O2 sats (reports on 3L at home).   VITAL SIGNS: BP 136/76 mmHg  Pulse 85  Temp(Src) 98.6 F (37 C) (Oral)  Resp 14  Wt 190 lb (86.183 kg)  SpO2 97%  HEMODYNAMICS:    VENTILATOR SETTINGS: Vent Mode:  [-]  FiO2 (%):  [40 %] 40 %  INTAKE / OUTPUT: I/O last 3 completed shifts: In: 2695.5 [I.V.:1138; NG/GT:657.5; IV Piggyback:900] Out: 4350 [Urine:3975; Stool:375]  PHYSICAL EXAMINATION: General:  Alert, obese female lying in bed Neuro: Pupils reactive bilaterally, normocephalic  HEENT:  jvd normal Cardiovascular:  s1 s2 RRT murm Lungs:  Coarse BS , no wheezing  Abdomen:  Soft, obese, NT,  ND Musculoskeletal: tr edema  Skin:  No rash  LABS:  BMET  Recent Labs Lab 01/16/16 1506 01/17/16 0230 01/17/16 2048  NA 153* 153* 150*  K 3.4* 3.0* 3.3*  CL 104 101 98*  CO2 41* 39* 36*  BUN 25* 31* 26*  CREATININE 0.68 0.69 0.86  GLUCOSE 202* 172* 413*    Electrolytes  Recent Labs Lab 01/14/16 0625 01/14/16 1420 01/15/16 0400  01/16/16 1506 01/17/16 0230 01/17/16 2048  CALCIUM 7.7*  --  8.2*  < > 9.4 9.4 9.2  MG 2.1 2.2 2.0  --   --   --   --   PHOS 5.5* 4.6 3.2  --   --   --   --   < > = values in this interval not displayed.  CBC  Recent Labs Lab 01/15/16 0400 01/16/16 0400 01/17/16 0230  WBC 14.7* 11.7* 10.0  HGB 7.5* 7.6* 8.6*  HCT 26.0* 26.8* 29.8*  PLT 228 308 307    Coag's No results for input(s): APTT, INR in the last 168 hours.  Sepsis Markers  Recent Labs Lab 01/13/16 1420 01/13/16 1514 01/14/16 0115 01/14/16 0625 01/15/16 0400  LATICACIDVEN  --  3.4* 1.3  --  1.4  PROCALCITON 1.01  --   --  0.86 0.58    ABG  Recent Labs Lab 01/11/16 2030 01/12/16 1117 01/13/16 1458  PHART 7.502* 7.567* 7.571*  PCO2ART 61.0* 53.5* 44.2  PO2ART 68.9* 227.0* 79.5*  Liver Enzymes  Recent Labs Lab 01/11/16 1843 01/12/16 0258  AST 14* 11*  ALT 17 13*  ALKPHOS 86 72  BILITOT 1.4* 1.6*  ALBUMIN 3.2* 2.6*    Cardiac Enzymes  Recent Labs Lab 01/13/16 1506 01/13/16 1800 01/14/16 0115  TROPONINI 0.10* 0.09* 0.09*    Glucose  Recent Labs Lab 01/17/16 1527 01/17/16 1534 01/17/16 1714 01/17/16 2018 01/18/16 0009 01/18/16 0355  GLUCAP 68 67 171* 372* 174* 119*    Imaging Dg Chest Port 1 View  01/18/2016  CLINICAL DATA:  Acute respiratory failure with hypoxia. Shortness of breath today. EXAM: PORTABLE CHEST 1 VIEW COMPARISON:  01/17/2016 and 01/16/2016 FINDINGS: 0717 hours. Interval extubation and removal of the nasogastric tube. There is stable cardiomegaly. Pulmonary edema has improved, although there is an asymmetric  component in the right upper lobe which appears slightly worse. No pneumothorax or significant pleural effusion is seen. Old rib fractures are present on the left. There are postsurgical changes in the cervical spine and both humeri. IMPRESSION: Overall improvement in pulmonary edema following extubation. There is asymmetric airspace disease in the right upper lobe which has mildly progressed. This may reflect asymmetric edema or developing infiltrate. Electronically Signed   By: Carey Bullocks M.D.   On: 01/18/2016 07:33    STUDIES:  4/4 Echo EF 60-65%, Mod LA dilated , MV severe stenosis , Gr 2 DD  4/23 CT Chest > Dependent airspace opacities in both lungs with associated volume loss, similar to recent radiographs. Although likely in part secondary to atelectasis, distribution is suspicious for infection, possibly on the basis of aspiration. Ground-glass opacities in the aerated lungs consistent with mild edema. Small bilateral pleural effusions.  CULTURES: Resp culture 4/21 > abundant staph aureus, MSSA  Ucx 4/20 > E.Coli/Klebsiella, sensitives reported [facility] 4/22 > NG 2 days C. Diff 4/23 > +Ag - Toxin  ANTIBIOTICS: Vanc 4/22 >> 4/24 Zosyn 4/22 >> 4/24 Ancef 4/24 >> Flagyl 4/24 >>  SIGNIFICANT EVENTS: 4/26 extubated  LINES/TUBES: ETT 4/21>4/26  DISCUSSION: 55 yo woman with history of CHF and COPD who came in with respiratory distress and was emergently intubated on 4/21 due to difficulty maintaining O2 sats above 90 while on biPap.  ASSESSMENT / PLAN:  PULMONARY A: Acute hypoxic and hypercarbic respiratory failure secondary to CHF exacerbation  COPD 4/22 >pH 7.5  P:   -Extubated 4/26 -duonebs prn -ABX per ID sxn   CARDIOVASCULAR A:  HTN Mitral stenosis-echo 4/4 with severe stenosis  CHF with grade 2 diastolic dysfx- last echo 4/4 with EF 60-65% with grade 2 diastolic dysfunction Hypotension requiring fluid bolus 4/22  4/23 >-6.7 L Bal on lasix  , min elevated troponin ?  Demand  Tachycardia - improved P:  -Cr stable, 3.0 UOP -Continue lasix 80 IV bid -strict I & O -Continue metoprolol per cards  RENAL A:   Renal Insufficiency (baseline 0.62 )  Hypokalemia  hypomag-resolved  hypophos-resolved  pulm edema Hypernatremia - persistent  P:   -Continue lasix 80 bid -replete K as needed -HyperNa 150 last night on 1/2 NS 50 cc/hr -am BMET pending  GASTROINTESTINAL A:   GERD P:   PPI  HEMATOLOGIC A:   Anaemia DVT prev 4/23>hbg tr down 9.1>8.8>7.3-no act bleeding - stable P:  Sub q hep Cbc in am , follow wbc  INFECTIOUS A:  Fever ? HCAP -BC NG, Trach growing MSSA UTI - E.Coli/Klebsiella - ancef sensitive 4/23 >PCT 1 >0.86 >0.58 , LA 3.4 >1.3  Diarrhea - C. Diff  toxin neg, Ag pos P:   Continue Ancef end date: 4/28 for 7 day course Continued Flagyl end date: 5/4 for 10 day course CBC in am  ENDOCRINE A:   Hypothyroidism-TSH 5.4  T2Dm, most recent a1c of 6.7  HLD 4/23>BS tr up on TF  P:   -Cont Lantus 15 units daily -SSI -monitor CBGs -synthroid-cont   NEUROLOGIC A:   Bipolar History of seizures- no reported seizure  depression P:   Continue depakote- level 28 , dose increased 4/22  Restart Xanax abilify  FAMILY  - Updates: none  - Inter-disciplinary family meet or Palliative Care meeting due by:  4/28   Valentino NoseNathan Donabelle Molden, MD IMT[facility] PGY-1 [phone]

## 2016-01-18 NOTE — Progress Notes (Signed)
     SUBJECTIVE: Feeling better. No chest pain.   Tele: sinus  BP 139/67 mmHg  Pulse 78  Temp(Src) 98.3 F (36.8 C) (Oral)  Resp 21  Wt 190 lb (86.183 kg)  SpO2 100%  Intake/Output Summary (Last 24 hours) at 01/18/16 0531 Last data filed at 01/18/16 0500  Gross per 24 hour  Intake 1945.5 ml  Output   3250 ml  Net -1304.5 ml    PHYSICAL EXAM General: Well developed, well nourished, in no acute distress. Alert and oriented x 3.  Psych:  Good affect, responds appropriately Neck: No JVD. No masses noted.  Lungs: Clear bilaterally with no wheezes or rhonci noted.  Heart: RRR with no diastolic murmur noted. Abdomen: Bowel sounds are present. Soft, non-tender.  Extremities: No lower extremity edema.   LABS: Basic Metabolic Panel:  Recent Labs  01/17/16 0230 01/17/16 2048  NA 153* 150*  K 3.0* 3.3*  CL 101 98*  CO2 39* 36*  GLUCOSE 172* 413*  BUN 31* 26*  CREATININE 0.69 0.86  CALCIUM 9.4 9.2   CBC:  Recent Labs  01/16/16 0400 01/17/16 0230  WBC 11.7* 10.0  HGB 7.6* 8.6*  HCT 26.8* 29.8*  MCV 82.7 81.4  PLT 308 307   Fasting Lipid Panel:  Recent Labs  01/16/16 0400  CHOL 123  HDL 34*  LDLCALC 66  TRIG 113  CHOLHDL 3.6    Current Meds: . antiseptic oral rinse  7 mL Mouth Rinse QID  . ARIPiprazole  5 mg Oral Daily  . aspirin  81 mg Oral Daily  .  ceFAZolin (ANCEF) IV  2 g Intravenous Q8H  . chlorhexidine gluconate (SAGE KIT)  15 mL Mouth Rinse BID  . furosemide  80 mg Intravenous BID  . heparin  5,000 Units Subcutaneous Q8H  . insulin aspart  0-20 Units Subcutaneous Q4H  . insulin glargine  15 Units Subcutaneous Daily  . levothyroxine  125 mcg Per Tube QAC breakfast  . LORazepam  0.5 mg Intravenous Once  . metoprolol  5 mg Intravenous Q6H  . metronidazole  500 mg Intravenous Q8H  . pantoprazole sodium  40 mg Per Tube Daily  . simvastatin  10 mg Oral q1800  . sodium chloride flush  3 mL Intravenous Q12H  . sodium chloride flush  3 mL  Intravenous Q12H  . valproic acid  500 mg Per Tube BID     ASSESSMENT AND PLAN:  1. Acute Hypoxic Respiratory Failure/ Acute on Chronic Diastolic CHF: Respiratory failure likely secondary to combination of pneumonia and volume overload from diastolic CHF with known severe mitral stenosis. [name] negative 12.4 liters since admission, 1.3 liters negative last 24 hours. She has been extubated. Volume status is much improved. Beta blocker restarted.   2. 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 Will need to consider further workup for mitral valve replacement with TEE once her current medical condition improves.   3. HTN: BP stable.     [name]  4/27/20175:31 AM

## 2016-01-18 NOTE — Progress Notes (Signed)
Inpatient Diabetes Program Recommendations  AACE/ADA: New Consensus Statement on Inpatient Glycemic Control (2015)  Target Ranges:  Prepandial:   less than 140 mg/dL      Peak postprandial:   less than 180 mg/dL (1-2 hours)      Critically ill patients:  140 - 180 mg/dL  Results for [name], [name] (MRN [id number]) as of 01/18/2016 11:02  Ref. Range 01/17/2016 04:08 01/17/2016 07:59 01/17/2016 11:16 01/17/2016 15:27 01/17/2016 15:34 01/17/2016 17:14 01/17/2016 20:18 01/18/2016 00:09 01/18/2016 03:55 01/18/2016 08:26  Glucose-Capillary Latest Ref Range: 65-99 mg/dL 308109 (H) 657207 (H) 846275 (H) 68 67 171 (H) 372 (H) 174 (H) 119 (H) 338 (H)   Review of Glycemic Control  Current orders for Inpatient glycemic control: Lantus 15 units daily, Novolog 0-20 units Q4H  Inpatient Diabetes Program Recommendations: Insulin - Basal: Please consider increasing Lantus to 22 units daily (based on 86 kg x 0.25 units).  Thanks, Orlando PennerMarie Laval Cafaro, RN, MSN, CDE Diabetes Coordinator Inpatient Diabetes Program [phone] (Team Pager from 8am to 5pm) [phone] ([facility] office) [phone] [facility] office) [phone] [facility] office)

## 2016-01-19 ENCOUNTER — Inpatient Hospital Stay (HOSPITAL_COMMUNITY): Payer: Medicaid Other

## 2016-01-19 LAB — BASIC METABOLIC PANEL
Anion gap: 14 (ref 5–15)
BUN: 26 mg/dL — AB (ref 6–20)
CHLORIDE: 96 mmol/L — AB (ref 101–111)
CO2: 39 mmol/L — ABNORMAL HIGH (ref 22–32)
CREATININE: 0.84 mg/dL (ref 0.44–1.00)
Calcium: 9.4 mg/dL (ref 8.9–10.3)
GFR calc Af Amer: >60 mL/min (ref >60)
GFR calc non Af Amer: >60 mL/min (ref >60)
Glucose, Bld: 310 mg/dL — ABNORMAL HIGH (ref 65–99)
Potassium: 3 mmol/L — ABNORMAL LOW (ref 3.5–5.1)
SODIUM: 149 mmol/L — AB (ref 135–145)

## 2016-01-19 LAB — GLUCOSE, CAPILLARY
GLUCOSE-CAPILLARY: 261 mg/dL — AB (ref 65–99)
GLUCOSE-CAPILLARY: 268 mg/dL — AB (ref 65–99)
GLUCOSE-CAPILLARY: 271 mg/dL — AB (ref 65–99)
GLUCOSE-CAPILLARY: 348 mg/dL — AB (ref 65–99)
GLUCOSE-CAPILLARY: 392 mg/dL — AB (ref 65–99)
Glucose-Capillary: 315 mg/dL — ABNORMAL HIGH (ref 65–99)

## 2016-01-19 MED ORDER — POTASSIUM CHLORIDE CRYS ER 20 MEQ PO TBCR
40.0000 meq | EXTENDED_RELEASE_TABLET | Freq: Once | ORAL | Status: AC
  Administered 2016-01-19: 40 meq via ORAL
  Filled 2016-01-19: qty 2

## 2016-01-19 MED ORDER — RESOURCE THICKENUP CLEAR PO POWD: ORAL | Status: DC | PRN

## 2016-01-19 MED ORDER — INSULIN GLARGINE 100 UNIT/ML ~~LOC~~ SOLN
22.0000 [IU] | Freq: Every day | SUBCUTANEOUS | Status: DC
  Administered 2016-01-20 – 2016-01-23 (×4): 22 [IU] via SUBCUTANEOUS
  Filled 2016-01-19 (×4): qty 0.22

## 2016-01-19 MED ORDER — ADULT MULTIVITAMIN W/MINERALS CH
1.0000 | ORAL_TABLET | Freq: Every day | ORAL | Status: DC
  Administered 2016-01-19 – 2016-01-26 (×7): 1 via ORAL
  Filled 2016-01-19 (×8): qty 1

## 2016-01-19 MED ORDER — WHITE PETROLATUM GEL
Status: AC
  Administered 2016-01-19: 0.2
  Filled 2016-01-19: qty 1

## 2016-01-19 MED ORDER — LORAZEPAM 2 MG/ML IJ SOLN
0.5000 mg | Freq: Once | INTRAMUSCULAR | Status: AC
  Administered 2016-01-19: 0.5 mg via INTRAVENOUS
  Filled 2016-01-19: qty 1

## 2016-01-19 MED ORDER — INSULIN ASPART 100 UNIT/ML ~~LOC~~ SOLN
0.0000 [IU] | SUBCUTANEOUS | Status: DC
  Administered 2016-01-19: 5 [IU] via SUBCUTANEOUS
  Administered 2016-01-19: 9 [IU] via SUBCUTANEOUS

## 2016-01-19 MED ORDER — RESOURCE THICKENUP CLEAR PO POWD
ORAL | Status: DC | PRN
  Administered 2016-01-19: 12:00:00 via ORAL
  Filled 2016-01-19 (×3): qty 125

## 2016-01-19 NOTE — Progress Notes (Signed)
PROGRESS NOTE                                                                                                                                                                                                             Patient Demographics:    [name], is a 55 y.o. female, DOB - 1961/08/27, NUU:[id number]  Admit date - 01/11/2016   Admitting Physician Ozella Rocks, MD  Outpatient Primary MD for the patient is [name], MD  LOS - 8  No chief complaint on file.      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  Patient was recently admitted on 4/1 for AoCHF and COPDex. She had to be intubated, as she decompensated from respiratory standpoint as it was thought to be due to ARDS secondary to aspiration. She improved slowly and was transferred to to inpt rehab on 4/18.  She presented this time with acute respiratory failure secondary to acute diastolic congestive heart failure with last echo showing EF of 60% with grade 2 diastolic dysfunction. [name] on home lasix 40 mg BID , but was only taking 40 mg daily while on inpt rehab, so likely this contributed to the exacerbation. She was started on lasix, and on 4/21, rn was having difficulty keeping the patient's sats above 90 despite Bipap. Pt had to be emergently intubated, and PCCM was asked to take over.    Subjective:    [name]    Assessment  & Plan :    Principal Problem:   Acute on chronic respiratory failure (HCC) - secondary staph pneumonia. Continue Cefazolin and Flagyl  Active Problems:   Diabetes mellitus, insulin dependent (IDDM), uncontrolled (HCC) - Continue SSI and increase Lantus to 22 units SQ daily   HTN (hypertension)   COPD (chronic  obstructive pulmonary disease) (HCC) - stable no wheezes   Physical deconditioning  - PT evaluation   GERD without esophagitis - stable continue ppi   Bipolar I disorder (HCC) - stable   Acute on chronic diastolic CHF (congestive heart failure), NYHA class 1 (HCC) - currently on lasix. Will continue current regimen   Seizures (HCC) - stable on  valproic acid   Respiratory failure (HCC)   Hyperlipidemia - continue statin, stable   Rheumatic mitral stenosis   Code Status : Full   Family Communication  : d/c patient directly  Disposition Plan  : pending improvement in respiratory condition and evaluation by PT  Barriers For Discharge : as above  Consults  :  Cardiology  Procedures  :  SIGNIFICANT EVENTS: 4/26 extubated  LINES/TUBES: ETT 4/21>4/26  DVT Prophylaxis  :  Heparin   Lab Results  Component Value Date   PLT 307 01/17/2016    Antibiotics  :  Cefazolin and Flagyl  Anti-infectives    Start     Dose/Rate Route Frequency Ordered Stop   01/15/16 2200  ceFAZolin (ANCEF) IVPB 2g/100 mL premix     2 g 200 mL/hr over 30 Minutes Intravenous Every 8 hours 01/15/16 2126 01/19/16 2359   01/15/16 1015  ceFAZolin (ANCEF) IVPB 2g/100 mL premix     2 g 200 mL/hr over 30 Minutes Intravenous Every 8 hours 01/15/16 1000 01/15/16 1155   01/15/16 0945  metroNIDAZOLE (FLAGYL) IVPB 500 mg     500 mg 100 mL/hr over 60 Minutes Intravenous Every 8 hours 01/15/16 0944 01/25/16 2359   01/13/16 1600  piperacillin-tazobactam (ZOSYN) IVPB 3.375 g  Status:  Discontinued     3.375 g 12.5 mL/hr over 240 Minutes Intravenous Every 8 hours 01/13/16 1445 01/15/16 0941   01/13/16 1600  vancomycin (VANCOCIN) 1,250 mg in sodium chloride 0.9 % 250 mL IVPB  Status:  Discontinued     1,250 mg 166.7 mL/hr over 90 Minutes Intravenous Every 12 hours 01/13/16 1445 01/15/16 1425        Objective:   Filed Vitals:   01/19/16 0755 01/19/16 1205 01/19/16 1355 01/19/16 1719  BP: 124/63 130/80 97/77  111/59  Pulse: 82 86 80 83  Temp:   98.4 F (36.9 C)   TempSrc:   Oral   Resp:   18   Weight:      SpO2:   99%     Wt Readings from Last 3 Encounters:  01/19/16 86.9 kg (191 lb 9.3 oz)  01/11/16 101.9 kg (224 lb 10.4 oz)  01/09/16 103.42 kg (228 lb)     Intake/Output Summary (Last 24 hours) at 01/19/16 1738 Last data filed at 01/19/16 1700  Gross per 24 hour  Intake 1928.34 ml  Output   3000 ml  Net -1071.66 ml     Physical Exam  Awake Alert, in NAD Supple Neck, no goiter Symmetrical Chest wall movement, Good air movement bilaterally, no wheezes RRR,No Gallops,Rubs or new Murmurs, No Parasternal Heave +ve B.Sounds, Abd Soft, No tenderness, No organomegaly appriciated, No rebound - guarding or rigidity. No Clubbing    Data Review:    CBC  Recent Labs Lab 01/14/16 0625 01/15/16 0400 01/16/16 0400 01/17/16 0230  WBC 13.9* 14.7* 11.7* 10.0  HGB 7.3* 7.5* 7.6* 8.6*  HCT 25.2* 26.0* 26.8* 29.8*  PLT 194 228 308 307  MCV 81.0 82.3 82.7 81.4  MCH 23.5* 23.7* 23.5* 23.5*  MCHC 29.0* 28.8* 28.4* 28.9*  RDW 18.6* 18.4* 18.5* 18.3*    Chemistries   Recent Labs Lab 01/13/16 1420 01/14/16 0115 01/14/16 0625 01/14/16 1420 01/15/16 0400  01/16/16 1506 01/17/16 0230 01/17/16 2048 01/18/16 0748 01/19/16 0641  NA  --   --  144  --  152*  < > 153* 153* 150* 150* 149*  K  --   --  2.9*  --  2.8*  < >  3.4* 3.0* 3.3* 3.6 3.0*  CL  --   --  92*  --  101  < > 104 101 98* 98* 96*  CO2  --   --  37*  --  39*  < > 41* 39* 36* 38* 39*  GLUCOSE  --   --  354*  --  139*  < > 202* 172* 413* 341* 310*  BUN  --   --  39*  --  38*  < > 25* 31* 26* 28* 26*  CREATININE  --   --  1.06*  --  0.86  < > 0.68 0.69 0.86 0.79 0.84  CALCIUM  --   --  7.7*  --  8.2*  < > 9.4 9.4 9.2 9.3 9.4  MG 2.4 2.1 2.1 2.2 2.0  --   --   --   --   --   --   < > = values in this interval not  displayed. ------------------------------------------------------------------------------------------------------------------ No results for input(s): CHOL, HDL, LDLCALC, TRIG, CHOLHDL, LDLDIRECT in the last 72 hours.  Lab Results  Component Value Date   HGBA1C 7.5* 01/11/2016   ------------------------------------------------------------------------------------------------------------------ No results for input(s): TSH, T4TOTAL, T3FREE, THYROIDAB in the last 72 hours.  Invalid input(s): FREET3 ------------------------------------------------------------------------------------------------------------------ No results for input(s): VITAMINB12, FOLATE, FERRITIN, TIBC, IRON, RETICCTPCT in the last 72 hours.  Coagulation profile No results for input(s): INR, PROTIME in the last 168 hours.  No results for input(s): DDIMER in the last 72 hours.  Cardiac Enzymes  Recent Labs Lab 01/13/16 1506 01/13/16 1800 01/14/16 0115  TROPONINI 0.10* 0.09* 0.09*   ------------------------------------------------------------------------------------------------------------------    Component Value Date/Time   BNP 289.7* 01/14/2016 1002    Inpatient Medications  Scheduled Meds: . antiseptic oral rinse  7 mL Mouth Rinse q12n4p  . ARIPiprazole  5 mg Oral Daily  . aspirin  81 mg Oral Daily  .  ceFAZolin (ANCEF) IV  2 g Intravenous Q8H  . chlorhexidine  15 mL Mouth Rinse BID  . furosemide  80 mg Intravenous BID  . heparin  5,000 Units Subcutaneous Q8H  . insulin aspart  0-9 Units Subcutaneous TID WC  . [START ON 01/20/2016] insulin glargine  22 Units Subcutaneous Daily  . levothyroxine  62.5 mcg Intravenous Daily  . metoprolol  5 mg Intravenous Q6H  . metronidazole  500 mg Intravenous Q8H  . multivitamin with minerals  1 tablet Oral Daily  . pantoprazole (PROTONIX) IV  40 mg Intravenous Daily  . simvastatin  10 mg Oral q1800  . sodium chloride flush  3 mL Intravenous Q12H  . valproic acid   500 mg Per Tube BID   Continuous Infusions:  PRN Meds:.sodium chloride, ipratropium-albuterol, midazolam, RESOURCE THICKENUP CLEAR, sodium chloride flush  Micro Results Recent Results (from the past 240 hour(s))  Culture, Urine     Status: Abnormal   Collection Time: 01/11/16  3:00 PM  Result Value Ref Range Status   Specimen Description URINE, CATHETERIZED  Final   Special Requests NONE  Final   Culture (A)  Final    >=100,000 COLONIES/mL ESCHERICHIA COLI 40,000 COLONIES/mL KLEBSIELLA PNEUMONIAE    Report Status 01/13/2016 FINAL  Final   Organism ID, Bacteria ESCHERICHIA COLI (A)  Final   Organism ID, Bacteria KLEBSIELLA PNEUMONIAE (A)  Final      Susceptibility   Escherichia coli - MIC*    AMPICILLIN <=2 SENSITIVE Sensitive     CEFAZOLIN <=4 SENSITIVE Sensitive     CEFTRIAXONE <=1 SENSITIVE Sensitive  CIPROFLOXACIN >=4 RESISTANT Resistant     GENTAMICIN <=1 SENSITIVE Sensitive     IMIPENEM <=0.25 SENSITIVE Sensitive     NITROFURANTOIN <=16 SENSITIVE Sensitive     TRIMETH/SULFA <=20 SENSITIVE Sensitive     AMPICILLIN/SULBACTAM <=2 SENSITIVE Sensitive     PIP/TAZO <=4 SENSITIVE Sensitive     * >=100,000 COLONIES/mL ESCHERICHIA COLI   Klebsiella pneumoniae - MIC*    AMPICILLIN >=32 RESISTANT Resistant     CEFAZOLIN <=4 SENSITIVE Sensitive     CEFTRIAXONE <=1 SENSITIVE Sensitive     CIPROFLOXACIN <=0.25 SENSITIVE Sensitive     GENTAMICIN <=1 SENSITIVE Sensitive     IMIPENEM <=0.25 SENSITIVE Sensitive     NITROFURANTOIN 64 INTERMEDIATE Intermediate     TRIMETH/SULFA <=20 SENSITIVE Sensitive     AMPICILLIN/SULBACTAM 4 SENSITIVE Sensitive     PIP/TAZO <=4 SENSITIVE Sensitive     * 40,000 COLONIES/mL KLEBSIELLA PNEUMONIAE  Culture, respiratory (NON-Expectorated)     Status: None   Collection Time: 01/12/16 10:25 AM  Result Value Ref Range Status   Specimen Description TRACHEAL ASPIRATE  Final   Special Requests NONE  Final   Gram Stain   Final    ABUNDANT WBC  PRESENT,BOTH PMN AND MONONUCLEAR FEW SQUAMOUS EPITHELIAL CELLS PRESENT ABUNDANT GRAM POSITIVE COCCI IN PAIRS IN CLUSTERS Performed at [facility]    Culture   Final    ABUNDANT STAPHYLOCOCCUS AUREUS Note: RIFAMPIN AND GENTAMICIN SHOULD NOT BE USED AS SINGLE DRUGS FOR TREATMENT OF STAPH INFECTIONS. Performed at [facility]    Report Status 01/15/2016 FINAL  Final   Organism ID, Bacteria STAPHYLOCOCCUS AUREUS  Final      Susceptibility   Staphylococcus aureus - MIC*    CLINDAMYCIN <=0.25 SENSITIVE Sensitive     ERYTHROMYCIN <=0.25 SENSITIVE Sensitive     GENTAMICIN <=0.5 SENSITIVE Sensitive     LEVOFLOXACIN 0.25 SENSITIVE Sensitive     OXACILLIN 0.5 SENSITIVE Sensitive     RIFAMPIN <=0.5 SENSITIVE Sensitive     TRIMETH/SULFA <=10 SENSITIVE Sensitive     VANCOMYCIN <=0.5 SENSITIVE Sensitive     TETRACYCLINE <=1 SENSITIVE Sensitive     MOXIFLOXACIN <=0.25 SENSITIVE Sensitive     * ABUNDANT STAPHYLOCOCCUS AUREUS  Culture, blood (Routine X 2) w Reflex to ID Panel     Status: None   Collection Time: 01/13/16  6:00 PM  Result Value Ref Range Status   Specimen Description BLOOD LEFT ARM  Final   Special Requests BOTTLES DRAWN AEROBIC AND ANAEROBIC 5CC  Final   Culture NO GROWTH 5 DAYS  Final   Report Status 01/18/2016 FINAL  Final  Culture, blood (Routine X 2) w Reflex to ID Panel     Status: None   Collection Time: 01/13/16  6:10 PM  Result Value Ref Range Status   Specimen Description BLOOD LEFT HAND  Final   Special Requests IN PEDIATRIC BOTTLE 3CC  Final   Culture NO GROWTH 5 DAYS  Final   Report Status 01/18/2016 FINAL  Final  C difficile quick scan w PCR reflex     Status: Abnormal   Collection Time: 01/14/16  9:58 PM  Result Value Ref Range Status   C Diff antigen POSITIVE (A) NEGATIVE Final   C Diff toxin NEGATIVE NEGATIVE Final   C Diff interpretation   Final    C. difficile present, but toxin not detected. This indicates colonization. In most cases,  this does not require treatment. If patient has signs and symptoms consistent with colitis, consider  treatment. Requires ENTERIC precautions.  Clostridium Difficile by PCR     Status: None   Collection Time: 01/14/16  9:58 PM  Result Value Ref Range Status   Toxigenic C Difficile by pcr NEGATIVE NEGATIVE Final    Radiology Reports Dg Chest 2 View  01/08/2016  CLINICAL DATA:  Shortness of breath and weakness EXAM: CHEST  2 VIEW COMPARISON:  January 06, 2016 FINDINGS: There is interstitial edema with cardiomegaly and mild pulmonary venous hypertension. There is patchy atelectasis in both lower lung zones. There is no airspace consolidation. No adenopathy. The patient is status post left total shoulder replacement as well as postoperative change in the proximal right humerus. There is postoperative change in the lower cervical region as well. IMPRESSION: Findings consistent with a degree of congestive heart failure. Areas of atelectasis in both lower lung zones. No airspace consolidation. Electronically Signed   By: Bretta Bang III M.D.   On: 01/08/2016 19:05   Dg Chest 2 View  01/06/2016  CLINICAL DATA:  54 year old female with a several hour history of dry cough and dyspnea. Medical history includes COPD and CHF. EXAM: CHEST  2 VIEW COMPARISON:  Prior chest x-ray 01/04/2016 FINDINGS: Left IJ approach central venous catheter remains in stable position with the tip projecting over the superior cavoatrial junction. The feeding tube is been removed. Stable cardiac and mediastinal contours. Improved pulmonary edema compared to prior imaging. There is still some persistent pulmonary vascular congestion. No new focal airspace consolidation, large effusion or evidence of pneumothorax. Incompletely imaged cervical thoracic stabilization hardware, left shoulder arthroplasty prosthesis and ORIF of a right proximal humerus fracture. IMPRESSION: 1. Improved pulmonary edema compared to 01/04/2016. There is mild  residual pulmonary vascular congestion. 2. Stable cardiomegaly. 3. No new or acute cardiopulmonary process. Electronically Signed   By: Malachy Moan M.D.   On: 01/06/2016 10:29   Dg Chest 2 View  12/22/2015  CLINICAL DATA:  Acute onset of bilateral leg pain and shortness of breath. Wounds on both legs. Initial encounter. EXAM: CHEST  2 VIEW COMPARISON:  Chest radiograph performed 10/16/2015 FINDINGS: The lungs are well-aerated. Mild vascular congestion is noted. Mildly increased interstitial markings may reflect mild interstitial edema. No pleural effusion or pneumothorax is seen. The heart is mildly enlarged. No acute osseous abnormalities are seen. Hardware is noted along the proximal humerus bilaterally. Cervical spinal fusion hardware is partially imaged. IMPRESSION: Mild vascular congestion noted. Mildly increased interstitial markings may reflect mild interstitial edema. Mild cardiomegaly noted. Electronically Signed   By: Roanna Raider M.D.   On: 12/22/2015 22:53   Ct Head Wo Contrast  01/01/2016  CLINICAL DATA:  Acute encephalopathy. EXAM: CT HEAD WITHOUT CONTRAST TECHNIQUE: Contiguous axial images were obtained from the base of the skull through the vertex without intravenous contrast. COMPARISON:  CT scan of October 16, 2015. FINDINGS: Fluid is noted in the mastoid air cells bilaterally. Bony calvarium appears intact. Stable diffuse cortical atrophy is noted. Mild chronic ischemic white matter disease is noted. No mass effect or midline shift is noted. Ventricular size is within normal limits. There is no evidence of mass lesion, hemorrhage or acute infarction. IMPRESSION: Stable diffuse cortical atrophy. Mild chronic ischemic white matter disease. No acute intracranial abnormality seen. Electronically Signed   By: Lupita Raider, M.D.   On: 01/01/2016 15:31   Ct Chest Wo Contrast  01/14/2016  CLINICAL DATA:  Acute respiratory distress requiring intubation 3 days ago. Worsening hypoxemia.  EXAM: CT CHEST WITHOUT CONTRAST TECHNIQUE: Multidetector  CT imaging of the chest was performed following the standard protocol without IV contrast. COMPARISON:  Radiographs 01/14/2016 and 01/13/2016.  CT 09/11/2013. FINDINGS: Mediastinum/Nodes: Small mediastinal and hilar lymph nodes bilaterally are similar to the prior CT. Hilar assessment is limited by the lack of intravenous contrastand perihilar pulmonary opacities.There is no axillary adenopathy. Endotracheal tube, nasogastric tube and right IJ central venous catheter are in place. There is mild cardiomegaly. No significant pericardial fluid. Mild atherosclerosis is present. Lungs/Pleura: Small bilateral pleural effusions. There are patchy airspace opacities dependently in both lungs with mild volume loss in the lower lobes. There are patchy ground-glass opacities throughout the aerated lungs. No endobronchial lesion or suspicious pulmonary nodule. Upper abdomen: The visualized upper abdomen demonstrates no suspicious findings. Musculoskeletal/Chest wall: There is mild generalized edema within the chest wall. No acute osseous findings are seen. There is stable chronic sternal deformity. There are postsurgical changes in the lower cervical spine and both proximal humeri. IMPRESSION: 1. Dependent airspace opacities in both lungs with associated volume loss, similar to recent radiographs. Although likely in part secondary to atelectasis, distribution is suspicious for infection, possibly on the basis of aspiration. 2. Ground-glass opacities in the aerated lungs consistent with mild edema. 3. Small bilateral pleural effusions. Electronically Signed   By: Carey Bullocks M.D.   On: 01/14/2016 17:14   Dg Chest Port 1 View  01/18/2016  CLINICAL DATA:  Acute respiratory failure with hypoxia. Shortness of breath today. EXAM: PORTABLE CHEST 1 VIEW COMPARISON:  01/17/2016 and 01/16/2016 FINDINGS: 0717 hours. Interval extubation and removal of the nasogastric tube.  There is stable cardiomegaly. Pulmonary edema has improved, although there is an asymmetric component in the right upper lobe which appears slightly worse. No pneumothorax or significant pleural effusion is seen. Old rib fractures are present on the left. There are postsurgical changes in the cervical spine and both humeri. IMPRESSION: Overall improvement in pulmonary edema following extubation. There is asymmetric airspace disease in the right upper lobe which has mildly progressed. This may reflect asymmetric edema or developing infiltrate. Electronically Signed   By: Carey Bullocks M.D.   On: 01/18/2016 07:33   Dg Chest Port 1 View  01/17/2016  CLINICAL DATA:  CHF. EXAM: PORTABLE CHEST 1 VIEW COMPARISON:  01/16/2016. FINDINGS: Interim removal right IJ line. Endotracheal tube, NG tube in stable position . Cardiomegaly. Persistent but improving bilateral patchy pulmonary infiltrates and/or edema. No pleural effusion or pneumothorax. Prior cervical spine fusion. Postsurgical changes both shoulders. IMPRESSION: 1. Stable cardiomegaly. 2. Persistent but slightly improving patchy bilateral pulmonary infiltrates and/or pulmonary edema. Electronically Signed   By: Maisie Fus  Register   On: 01/17/2016 07:23   Dg Chest Port 1 View  01/16/2016  CLINICAL DATA:  Pneumonia EXAM: PORTABLE CHEST 1 VIEW COMPARISON:  January 15, 2016 FINDINGS: The heart size and mediastinal contours are stable. The heart size is enlarged. Endotracheal tube is identified distal tip 5 cm from carina. Right central venous line is noted distal tip in the superior vena cava. Nasogastric tube is identified distal tip is not included on film but is at least in the stomach. There is persistent pulmonary vascular congestion with enlargement of perihilar central pulmonary vessels unchanged. Mild hazy opacity of bilateral lung bases are identified unchanged. There is no pleural effusion. The visualized skeletal structures are stable. IMPRESSION: Mild  central pulmonary vascular congestion unchanged compared to prior exam. Mild hazy airspace disease in bilateral lung bases not significantly changed compared to prior exam. Electronically Signed   By:  Sherian Rein M.D.   On: 01/16/2016 07:22   Dg Chest Port 1 View  01/15/2016  CLINICAL DATA:  Respiratory failure EXAM: PORTABLE CHEST 1 VIEW COMPARISON:  01/14/2016 FINDINGS: Central line endotracheal tube and orogastric tube appear unchanged. Mild cardiac silhouette enlargement stable. There is vascular congestion with indistinct perihilar vasculature bilaterally. Hazy density at both lung bases again identified. IMPRESSION: Bilateral airspace disease is mildly improved when compared to prior study. Electronically Signed   By: Esperanza Heir M.D.   On: 01/15/2016 07:19   Dg Chest Port 1 View  01/14/2016  CLINICAL DATA:  Acute respiratory failure. On ventilator. Congestive heart failure. COPD. EXAM: PORTABLE CHEST 1 VIEW COMPARISON:  01/13/2016 FINDINGS: Patient is rotated to the right. Endotracheal tube, nasogastric tube, and right jugular central venous catheter remain in appropriate position. Cardiomegaly stable. Symmetric bilateral airspace disease with bibasilar predominance shows no significant change, most likely due to pulmonary edema or ARDS. No pneumothorax visualized. No definite pleural effusion noted on this portable exam. IMPRESSION: Stable cardiomegaly and symmetric bilateral airspace disease, most likely due to pulmonary edema or ARDS. Electronically Signed   By: Myles Rosenthal M.D.   On: 01/14/2016 08:32   Dg Chest Port 1 View  01/12/2016  CLINICAL DATA:  Encounter for central line placement EXAM: PORTABLE CHEST 1 VIEW COMPARISON:  Portable exam 1628 hours compared to 01/12/2016 FINDINGS: Tip of endotracheal tube projects 5.6 cm above carina. Nasogastric tube extends into stomach. New RIGHT jugular central venous catheter with tip projecting over SVC. Enlargement of cardiac silhouette with  pulmonary vascular congestion. BILATERAL pulmonary infiltrates question pulmonary edema. Suspect small bibasilar effusions. No pneumothorax. Prior cervical spine fusion. IMPRESSION: No pneumothorax following RIGHT jugular line placement. Enlargement of cardiac silhouette with vascular congestion and suspected BILATERAL pulmonary edema. Electronically Signed   By: Ulyses Southward M.D.   On: 01/12/2016 16:50   Dg Chest Port 1 View  01/12/2016  CLINICAL DATA:  Respiratory distress, recent intubation EXAM: PORTABLE CHEST 1 VIEW COMPARISON:  01/12/2016 FINDINGS: Cardiac shadow is stable. Bibasilar densities are again seen and stable. Mild vascular congestion is again noted. A nasogastric catheter is now noted within the stomach. An endotracheal tube is seen approximately 3.8 cm above the carina. Postsurgical changes are noted in both shoulders and lower cervical spine. No acute bony abnormality is seen. IMPRESSION: Stable appearance of the chest. The endotracheal tube and nasogastric catheter are in satisfactory position. Electronically Signed   By: Alcide Clever M.D.   On: 01/12/2016 10:43   Dg Chest Port 1 View  01/12/2016  CLINICAL DATA:  Acute renal failure ; congestive heart failure EXAM: PORTABLE CHEST 1 VIEW COMPARISON:  January 11, 2016 FINDINGS: There is interstitial edema throughout the lungs with patchy alveolar edema in the bases. There is cardiomegaly with pulmonary venous hypertension. There is a equivocal small pleural effusions bilaterally. There is postoperative change in the proximal right humerus. There is a total shoulder replacement on the left. There is postoperative change in the lower cervical spine. IMPRESSION: Congestive heart failure persists. Superimposed pneumonia in the bases cannot be excluded radiographically. Appearance is quite similar to 1 day prior. Electronically Signed   By: Bretta Bang III M.D.   On: 01/12/2016 07:29   Dg Chest Port 1 View  01/11/2016  CLINICAL DATA:   Shortness of breath today.  Initial encounter. EXAM: PORTABLE CHEST 1 VIEW COMPARISON:  PA and lateral chest 01/08/2016 and 01/06/2016. FINDINGS: Cardiomegaly and pulmonary edema are seen. There are likely  bilateral pleural effusions. Aeration is markedly worsened compared to the prior study. No pneumothorax is identified. Left shoulder replacement is noted. The patient is status post fixation of a proximal humerus fracture and cervical fusion. IMPRESSION: Marked worsening in aeration most consistent with congestive heart failure. Electronically Signed   By: Drusilla Kanner M.D.   On: 01/11/2016 15:11   Dg Chest Port 1 View  01/04/2016  CLINICAL DATA:  Respiratory failure. EXAM: PORTABLE CHEST 1 VIEW COMPARISON:  01/02/2016 . FINDINGS: Feeding tube and and left IJ line in stable position. Cardiomegaly with persistent diffuse bilateral pulmonary interstitial changes consistent congestive heart failure again noted. Basilar atelectasis and/or infiltrates again noted. Small bilateral pleural effusions. No pneumothorax. Prior cervical spine fusion. Postsurgical changes both shoulders again noted . IMPRESSION: 1. Lines and tubes in stable position. 2.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

## 2016-01-19 NOTE — Evaluation (Signed)
MBSS complete. Full report located under chart review in imaging section. [place] Mallozzi, MA CCC-SLP [phone]  

## 2016-01-19 NOTE — Progress Notes (Signed)
Nutrition Follow-up  DOCUMENTATION CODES:   Morbid obesity  INTERVENTION:   -MVI daily -Magic Cup TID with meals  NUTRITION DIAGNOSIS:   Increased nutrient needs related to wound healing as evidenced by estimated needs.  Ongoing  GOAL:   Patient will meet greater than or equal to 90% of their needs  Progressing  MONITOR:   PO intake, Supplement acceptance, Diet advancement, Labs, Weight trends, Skin, I & O's  REASON FOR ASSESSMENT:   Consult, Ventilator Enteral/tube feeding initiation and management  ASSESSMENT:   55 yo woman with history of CHF and COPD who came in with respiratory distress and was emergently intubated on 4/21 due to difficulty maintaining O2 sats above 90 while on biPap.  Pt extubated on 01/17/16. Pt transferred from ICU to medical floor on 01/18/16.   Staff report that pt is very aggitated regarding NPO status. Noted pt was down for MBSS at time of visit. Pt has been advanced to a dysphagia 2 diet with honey thickened liquids.   Pt with increased nutritional needs related to wound healing; RD will add supplements to optimize nutritional intake.   Labs reviewed: CBGS: 261-392. K: 3.0.   Diet Order:  DIET DYS 2 Room service appropriate?: Yes; Fluid consistency:: Honey Thick  Skin:  Wound (see comment) (stage II sacrum, MASD buttocks)  Last BM:  01/19/16  Height:   Ht Readings from Last 1 Encounters:  01/09/16 4\' 9"  (1.448 m)    Weight:   Wt Readings from Last 1 Encounters:  01/19/16 191 lb 9.3 oz (86.9 kg)    Ideal Body Weight:  43.8 kg  BMI:  Body mass index is 41.45 kg/(m^2).  Estimated Nutritional Needs:   Kcal:  1850-2050  Protein:  100-115 grams  Fluid:  >1.5 L  EDUCATION NEEDS:   No education needs identified at this time  Renny Gunnarson A. Mayford KnifeWilliams, RD, LDN, CDE Pager: [phone] After hours Pager: [phone]

## 2016-01-19 NOTE — Progress Notes (Signed)
     SUBJECTIVE: Feeling better. No chest pain. Dyspnea improving.   BP 124/63 mmHg  Pulse 82  Temp(Src) 98.7 F (37.1 C) (Oral)  Resp 20  Wt 191 lb 9.3 oz (86.9 kg)  SpO2 99%  Intake/Output Summary (Last 24 hours) at 01/19/16 1052 Last data filed at 01/19/16 1046  Gross per 24 hour  Intake 1815.84 ml  Output   2550 ml  Net -734.16 ml    PHYSICAL EXAM General: Well developed, well nourished, in no acute distress. Alert and oriented x 3.  Psych:  Good affect, responds appropriately Neck: No JVD. No masses noted.  Lungs: Clear bilaterally with no wheezes or rhonci noted.  Heart: RRR with no murmurs noted. Abdomen: Bowel sounds are present. Soft, non-tender.  Extremities: No lower extremity edema.   LABS: Basic Metabolic Panel:  Recent Labs  09/81/1904/27/17 0748 01/19/16 0641  NA 150* 149*  K 3.6 3.0*  CL 98* 96*  CO2 38* 39*  GLUCOSE 341* 310*  BUN 28* 26*  CREATININE 0.79 0.84  CALCIUM 9.3 9.4   CBC:  Recent Labs  01/17/16 0230  WBC 10.0  HGB 8.6*  HCT 29.8*  MCV 81.4  PLT 307   Current Meds: . antiseptic oral rinse  7 mL Mouth Rinse q12n4p  . ARIPiprazole  5 mg Oral Daily  . aspirin  81 mg Oral Daily  .  ceFAZolin (ANCEF) IV  2 g Intravenous Q8H  . chlorhexidine  15 mL Mouth Rinse BID  . furosemide  80 mg Intravenous BID  . heparin  5,000 Units Subcutaneous Q8H  . insulin aspart  0-9 Units Subcutaneous TID WC  . insulin aspart  0-9 Units Subcutaneous Q4H  . insulin glargine  15 Units Subcutaneous Daily  . levothyroxine  62.5 mcg Intravenous Daily  . metoprolol  5 mg Intravenous Q6H  . metronidazole  500 mg Intravenous Q8H  . pantoprazole (PROTONIX) IV  40 mg Intravenous Daily  . simvastatin  10 mg Oral q1800  . sodium chloride flush  3 mL Intravenous Q12H  . valproic acid  500 mg Per Tube BID     ASSESSMENT AND PLAN:  1. Acute Hypoxic Respiratory Failure/ Acute on Chronic Diastolic CHF: Respiratory failure likely secondary to combination of  pneumonia and volume overload from diastolic CHF with known severe mitral stenosis. [name] negative 13.6 liters since admission, 0.7 liters negative last 24 hours. Volume status is much improved. Would change to po Lasix today. (She received one dose of IV lasix this am). Beta blocker restarted.   2. 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 Will need to consider further workup for mitral valve replacement with TEE once her current medical condition improves.   3. HTN: BP stable.    Na Waldrip  4/28/201710:52 AM

## 2016-01-19 NOTE — Progress Notes (Signed)
Patient experiencing increased confusion. Calling the front desk repeatedly for water, saying she's fallen on the floor in her bedroom, and that she's having an insulin reaction (no insulin given). MD notified.

## 2016-01-19 NOTE — Progress Notes (Addendum)
Dr. Cena BentonVega paged to make aware potassium 3.0. Patient passed barium swallow and can take PO. Orders placed.

## 2016-01-19 NOTE — Progress Notes (Signed)
Inpatient Diabetes Program Recommendations  AACE/ADA: New Consensus Statement on Inpatient Glycemic Control (2015)  Target Ranges:  Prepandial:   less than 140 mg/dL      Peak postprandial:   less than 180 mg/dL (1-2 hours)      Critically ill patients:  140 - 180 mg/dL   Review of Glycemic Control  Results for [name], [name] (MRN [id number]) as of 01/19/2016 13:17  Ref. Range 01/18/2016 20:06 01/19/2016 00:46 01/19/2016 03:31 01/19/2016 08:05 01/19/2016 11:45  Glucose-Capillary Latest Ref Range: 65-99 mg/dL 409169 (H) 811271 (H) 914392 (H) 268 (H) 261 (H)    Current orders for Inpatient glycemic control: Lantus 15 units daily, Novolog 0-9 units Q4H, Novolog 0-9 units tid  Inpatient Diabetes Program Recommendations: Please consider increasing Lantus to 22 units daily (based on 86 kg x 0.25 units). Consider increasing Novolog correction to 0-15 units tid with meals and d/c both the Novolog 0-9 q4h and tid. Please consider adding Novolog 0-5 units qhs.   Tomorrow, if her intake is sufficient, may need to add scheduled Novolog with meals, along with Novolog correction.  Susette RacerJulie Hymie Gorr, RN, BA, MHA, CDE Diabetes Coordinator Inpatient Diabetes Program  [phone] (Team Pager) [phone] [facility] Office) 01/19/2016 1:22 PM

## 2016-01-19 NOTE — Progress Notes (Addendum)
Patient refusing her telemetry monitor "unless someone will get me some water". Explained to patient why she is NPO and why she needs her heart monitor. Patient still refusing. SLP contacted and stated they are familiar with patient and have scheduled her for a modified barium swallow this morning.   0840: patient agreeable to wear telemetry at this time and placed back on.

## 2016-01-19 NOTE — Care Management Note (Signed)
Case Management Note  Patient Details  Name: Elizabeth BuryMary J [name] MRN: [id number] Date of Birth: 05-27-1961  Subjective/Objective:                 Patient transferred from 42M yesterday to 5W.  Patient admitted for resp distress on 12/23/15. Was DC'd to CIR on 4/18 and readmitted to Inpatient on 4/20. +C. Diff, aggressive diuresing, and flexi seal.    Action/Plan:  Anticipate DC to SNF vs. CIR when medically stable.   Expected Discharge Date:                  Expected Discharge Plan:  Skilled Nursing Facility  In-House Referral:  Clinical Social Work  Discharge planning Services  CM Consult  Post Acute Care Choice:    Choice offered to:     DME Arranged:    DME Agency:     HH Arranged:    HH Agency:     Status of Service:  In process, will continue to follow  Medicare Important Message Given:    Date Medicare IM Given:    Medicare IM give by:    Date Additional Medicare IM Given:    Additional Medicare Important Message give by:     If discussed at Long Length of Stay Meetings, dates discussed:    Additional Comments:  Lawerance SabalDebbie Elizbeth Posa, RN 01/19/2016, 10:35 AM

## 2016-01-20 LAB — BASIC METABOLIC PANEL
Anion gap: 15 (ref 5–15)
BUN: 16 mg/dL (ref 6–20)
CO2: 34 mmol/L — ABNORMAL HIGH (ref 22–32)
CREATININE: 0.92 mg/dL (ref 0.44–1.00)
Calcium: 8.7 mg/dL — ABNORMAL LOW (ref 8.9–10.3)
Chloride: 84 mmol/L — ABNORMAL LOW (ref 101–111)
Glucose, Bld: 528 mg/dL — ABNORMAL HIGH (ref 65–99)
POTASSIUM: 2.6 mmol/L — AB (ref 3.5–5.1)
SODIUM: 133 mmol/L — AB (ref 135–145)

## 2016-01-20 LAB — GLUCOSE, CAPILLARY
GLUCOSE-CAPILLARY: 450 mg/dL — AB (ref 65–99)
GLUCOSE-CAPILLARY: 407 mg/dL — AB (ref 65–99)
Glucose-Capillary: 412 mg/dL — ABNORMAL HIGH (ref 65–99)
Glucose-Capillary: 168 mg/dL — ABNORMAL HIGH (ref 65–99)
Glucose-Capillary: 196 mg/dL — ABNORMAL HIGH (ref 65–99)

## 2016-01-20 LAB — CBC
HCT: 32.1 % — ABNORMAL LOW (ref 36.0–46.0)
Hemoglobin: 9.4 g/dL — ABNORMAL LOW (ref 12.0–15.0)
MCH: 23.5 pg — AB (ref 26.0–34.0)
MCHC: 29.3 g/dL — ABNORMAL LOW (ref 30.0–36.0)
MCV: 80.3 fL (ref 78.0–100.0)
PLATELETS: 576 10*3/uL — AB (ref 150–400)
RBC: 4 MIL/uL (ref 3.87–5.11)
RDW: 17.6 % — ABNORMAL HIGH (ref 11.5–15.5)
WBC: 9.2 10*3/uL (ref 4.0–10.5)

## 2016-01-20 MED ORDER — INSULIN ASPART 100 UNIT/ML ~~LOC~~ SOLN
0.0000 [IU] | Freq: Three times a day (TID) | SUBCUTANEOUS | Status: DC
  Administered 2016-01-20: 3 [IU] via SUBCUTANEOUS
  Administered 2016-01-20: 15 [IU] via SUBCUTANEOUS
  Administered 2016-01-21: 2 [IU] via SUBCUTANEOUS
  Administered 2016-01-21: 5 [IU] via SUBCUTANEOUS
  Administered 2016-01-21: 15 [IU] via SUBCUTANEOUS
  Administered 2016-01-22: 3 [IU] via SUBCUTANEOUS
  Administered 2016-01-22: 8 [IU] via SUBCUTANEOUS
  Administered 2016-01-23: 15 [IU] via SUBCUTANEOUS
  Administered 2016-01-23: 3 [IU] via SUBCUTANEOUS
  Administered 2016-01-23: 8 [IU] via SUBCUTANEOUS
  Administered 2016-01-24: 15 [IU] via SUBCUTANEOUS

## 2016-01-20 MED ORDER — POTASSIUM CHLORIDE 10 MEQ/100ML IV SOLN
10.0000 meq | INTRAVENOUS | Status: AC
  Administered 2016-01-20 (×3): 10 meq via INTRAVENOUS
  Filled 2016-01-20 (×3): qty 100

## 2016-01-20 MED ORDER — POTASSIUM CHLORIDE CRYS ER 20 MEQ PO TBCR
40.0000 meq | EXTENDED_RELEASE_TABLET | Freq: Once | ORAL | Status: AC
  Administered 2016-01-20: 40 meq via ORAL
  Filled 2016-01-20: qty 2

## 2016-01-20 MED ORDER — LORAZEPAM 0.5 MG PO TABS
0.5000 mg | ORAL_TABLET | Freq: Once | ORAL | Status: AC
  Administered 2016-01-21: 0.5 mg via ORAL
  Filled 2016-01-20: qty 1

## 2016-01-20 MED ORDER — PANTOPRAZOLE SODIUM 40 MG PO TBEC
40.0000 mg | DELAYED_RELEASE_TABLET | Freq: Every day | ORAL | Status: DC
  Administered 2016-01-21 – 2016-01-26 (×5): 40 mg via ORAL
  Filled 2016-01-20 (×6): qty 1

## 2016-01-20 MED ORDER — ACETAMINOPHEN 325 MG PO TABS
650.0000 mg | ORAL_TABLET | Freq: Four times a day (QID) | ORAL | Status: DC | PRN
  Administered 2016-01-21 – 2016-01-26 (×7): 650 mg via ORAL
  Filled 2016-01-20 (×8): qty 2

## 2016-01-20 NOTE — Evaluation (Signed)
Physical Therapy Evaluation Patient Details Name: [name] MRN: [id number] DOB: 10/05/1960 Today's Date: 01/20/2016   History of Present Illness  This is a 55 yo F with COPD, CHF, diabetes, mitral stenosis secondary to rheumatic fever history, diabetes, bipolar, GERD, seizures, left shoulder fracture, who came in with acute restorative failure and acute encephalopathy. Patient was recently admitted on 4/1 for AoCHF and COPDex. She had to be intubated, as she decompensated from respiratory standpoint as it was thought to be due to ARDS secondary to aspiration. She improved slowly and was transferred to to inpt rehab on 4/18. She presented this time with acute respiratory failure secondary to acute diastolic congestive heart failure with last echo showing EF of 60% with grade 2 diastolic dysfunction. [name] on home lasix 40 mg BID , but was only taking 40 mg daily while on inpt rehab, so likely this contributed to the exacerbation. She was started on lasix, and on 4/21, rn was having difficulty keeping the patient's sats above 90 despite Bipap. Pt had to be emergently intubated, and PCCM was asked to take over.   Clinical Impression  Pt very anxious and deconditioned. Pt currently required 2 person assist for OOB mobility. Recommend ST-SNF upon d/c to address below deficits to improve function to supervision level for safe transition home.    Follow Up Recommendations SNF;Supervision/Assistance - 24 hour    Equipment Recommendations  None recommended by PT    Recommendations for Other Services       Precautions / Restrictions Precautions Precautions: Fall Precaution Comments: 2Lo2 via [place], increased anxiety Other Brace/Splint: has special shoes Restrictions Weight Bearing Restrictions: No      Mobility  Bed Mobility Overal bed mobility: Needs Assistance Bed Mobility: Rolling;Sidelying to Sit Rolling: Min assist Sidelying to sit: Mod assist       General bed mobility  comments: pt able to bring LEs off EOB, unable to reach bedrail with R UE, minA to complete roll and minA for trunk elevation from sidelying to sit EOB  Transfers Overall transfer level: Needs assistance Equipment used: Rolling walker (2 wheeled) Transfers: Sit to/from [employer] Sit to Stand: Mod assist;+2 safety/equipment         General transfer comment: v/c's to push up from bed however pt continuously reaching for RW. 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    Modified Rankin (Stroke Patients Only)       Balance Overall balance assessment: Needs assistance Sitting-balance support: No upper extremity supported Sitting balance-Leahy Scale: Fair     Standing balance support: Bilateral upper extremity supported Standing balance-Leahy Scale: Poor Standing balance comment: requiring external support                             Pertinent Vitals/Pain Pain Assessment: No/denies pain    Home Living Family/patient expects to be discharged to:: Skilled nursing facility Living Arrangements: Spouse/significant other Available Help at Discharge: Family;Available 24 hours/day Type of Home: Apartment Home Access: Level entry     Home Layout: One level Home Equipment: Walker - 2 wheels;Cane - single  point;Shower seat;Grab bars - tub/shower;Hand held shower head;Wheelchair - Careers advisermanual;Other (comment);Bedside commode      Prior Function Level of Independence: Needs assistance   Gait / Transfers Assistance Needed: pt reports she walks short distances in the home but hasn't walked in weeks, she reports having family push her in a w/c, she never mentioned she was in rehab PTA  ADL's / Homemaking  Assistance Needed: has HHA that assists with bathing/dressing, laundry and meals 3x/week for 3-4 hours. reports significant other is disabled        Hand Dominance   Dominant Hand: Left    Extremity/Trunk Assessment   Upper Extremity Assessment: Generalized weakness           Lower Extremity Assessment: Generalized weakness (bilat foot deformities) RLE Deficits / Details: grossly atleast 3/5    Cervical / Trunk Assessment: Normal  Communication   Communication: No difficulties  Cognition Arousal/Alertness: Awake/alert Behavior During Therapy: Anxious Overall Cognitive Status: No family/caregiver present to determine baseline cognitive functioning                      General Comments General comments (skin integrity, edema, etc.): pt unaware of her liguids to be honey, educated pt on why    Exercises        Assessment/Plan    PT Assessment Patient needs continued PT services  PT Diagnosis Difficulty walking;Generalized weakness;Acute pain   PT Problem List Decreased strength;Decreased activity tolerance;Decreased balance;Decreased mobility;Decreased coordination;Decreased cognition  PT Treatment Interventions DME instruction;Gait training;Functional mobility training;Therapeutic activities;Therapeutic exercise;Balance training;Patient/family education   PT Goals (Current goals can be found in the Care Plan section) Acute Rehab PT Goals Patient Stated Goal: wants to go home PT Goal Formulation: With patient Time For Goal Achievement: 02/03/16 Potential to Achieve Goals: Good    Frequency Min 3X/week   Barriers to discharge   needs 24/7 assist    Co-evaluation               End of Session Equipment Utilized During Treatment: Gait belt;Oxygen Activity Tolerance: Patient limited by fatigue Patient left: in chair;with call bell/phone within reach;with chair alarm set Nurse Communication: Mobility status         Time: [id number] PT Time  Calculation (min) (ACUTE ONLY): 28 min   Charges:   PT Evaluation $PT Eval Moderate Complexity: 1 Procedure PT Treatments $Therapeutic Activity: 8-22 mins   PT G Codes:        Marcene BrawnChadwell, Elizabeth Barillas Marie 01/20/2016, 12:16 PM   Lewis ShockAshly Rawley [name], PT, DPT Pager #: [phone] Office #: [phone]

## 2016-01-20 NOTE — Progress Notes (Signed)
D/W Dr. Cena BentonVega - there is a suggestion of severe mitral stenosis on echo. Thought to be due to history of rheumatic disease. Would benefit from TEE to further visualize next week - helpful to see if replacement of balloon valvotomy would be indicated. [name] able to lie flat - now on po diuretics, but will need SNF rehab. Will follow over the weekend and discuss possible TEE tomorrow.  Chrystie NoseKenneth C. Shannon Kirkendall, MD, Southeast Regional Medical CenterFACC Attending Cardiologist [facility]

## 2016-01-20 NOTE — Progress Notes (Signed)
CRITICAL VALUE ALERT  Critical value received:  Potassium 2.6  Glucose 528  Date of notification:  01/20/16  Time of notification:  11:40  Critical value read back:Yes.    Nurse who received alert:  Midge AverVicki Anterrio Mccleery, RN  MD notified (1st page):  Dr. Cena BentonVega   Time of first page:  11:42  MD notified (2nd page):  Time of second page:  Responding MD:  Dr. Cena BentonVega  Time MD responded:  11:44  CBG 450 this am. Jovita GammaGave Lantus 22Units per order and Novolog 9Units per sliding scale Dr. Cena BentonVega will change sliding scale and order oral and IV runs of potassium

## 2016-01-20 NOTE — Progress Notes (Signed)
PROGRESS NOTE                                                                                                                                                                                                             Patient Demographics:    [name], is a 55 y.o. female, DOB - Nov 28, 1960, ZOX:[id number]  Admit date - 01/11/2016   Admitting Physician Ozella Rocks, MD  Outpatient Primary MD for the patient is [name], MD  LOS - 9  No chief complaint on file.      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  Patient was recently admitted on 4/1 for AoCHF and COPDex. She had to be intubated, as she decompensated from respiratory standpoint as it was thought to be due to ARDS secondary to aspiration. She improved slowly and was transferred to to inpt rehab on 4/18.  She presented this time with acute respiratory failure secondary to acute diastolic congestive heart failure with last echo showing EF of 60% with grade 2 diastolic dysfunction. [name] on home lasix 40 mg BID , but was only taking 40 mg daily while on inpt rehab, so likely this contributed to the exacerbation. She was started on lasix, and on 4/21, rn was having difficulty keeping the patient's sats above 90 despite Bipap. Pt had to be emergently intubated, and PCCM was asked to take over.    Subjective:    [name]    Assessment  & Plan :    Principal Problem:   Acute on chronic respiratory failure (HCC) - secondary staph pneumonia. Improved after 2 days of zosyn and 5 of cefazolin, totaling 7 days of antibiotic treatment.  Active Problems:   Diabetes mellitus, insulin dependent (IDDM), uncontrolled (HCC) - Continue SSI and increase Lantus to  22 units SQ daily   HTN (hypertension)   COPD (chronic obstructive pulmonary disease) (HCC) - stable no wheezes   Physical deconditioning  - PT evaluation   GERD without esophagitis - stable continue ppi   Bipolar I disorder (HCC) - stable   Acute on chronic diastolic CHF (congestive heart failure), NYHA class 1 (HCC) - currently on  lasix. Will continue current regimen   Seizures (HCC) - stable on valproic acid Diarrhea - improving on flagyl   Hyperlipidemia - continue statin, stable   Rheumatic mitral stenosis - Cardiology considering TEE next week.  Code Status : Full   Family Communication  : d/c patient directly  Disposition Plan  : pending improvement in respiratory condition and evaluation by PT  Barriers For Discharge : Pt will need evaluation of Rheumatic mitral valve stenosis  Consults  :  Cardiology  Procedures  :  SIGNIFICANT EVENTS: 4/26 extubated  LINES/TUBES: ETT 4/21>4/26  DVT Prophylaxis  :  Heparin   Lab Results  Component Value Date   PLT 576* 01/20/2016    Antibiotics  :  Cefazolin and Flagyl  Anti-infectives    Start     Dose/Rate Route Frequency Ordered Stop   01/15/16 2200  ceFAZolin (ANCEF) IVPB 2g/100 mL premix     2 g 200 mL/hr over 30 Minutes Intravenous Every 8 hours 01/15/16 2126 01/19/16 2236   01/15/16 1015  ceFAZolin (ANCEF) IVPB 2g/100 mL premix     2 g 200 mL/hr over 30 Minutes Intravenous Every 8 hours 01/15/16 1000 01/15/16 1155   01/15/16 0945  metroNIDAZOLE (FLAGYL) IVPB 500 mg     500 mg 100 mL/hr over 60 Minutes Intravenous Every 8 hours 01/15/16 0944 01/25/16 2359   01/13/16 1600  piperacillin-tazobactam (ZOSYN) IVPB 3.375 g  Status:  Discontinued     3.375 g 12.5 mL/hr over 240 Minutes Intravenous Every 8 hours 01/13/16 1445 01/15/16 0941   01/13/16 1600  vancomycin (VANCOCIN) 1,250 mg in sodium chloride 0.9 % 250 mL IVPB  Status:  Discontinued     1,250 mg 166.7 mL/hr over 90 Minutes Intravenous Every 12 hours  01/13/16 1445 01/15/16 1425        Objective:   Filed Vitals:   01/19/16 2205 01/20/16 0034 01/20/16 0531 01/20/16 1506  BP: 128/52 109/54 114/60 109/55  Pulse: 76  83 72  Temp: 98.8 F (37.1 C)  97.7 F (36.5 C) 98.7 F (37.1 C)  TempSrc: Oral   Oral  Resp: Weight:   84.9 kg (187 lb 2.7 oz)   SpO2: 100%  100% 100%    Wt Readings from Last 3 Encounters:  01/20/16 84.9 kg (187 lb 2.7 oz)  01/11/16 101.9 kg (224 lb 10.4 oz)  01/09/16 103.42 kg (228 lb)     Intake/Output Summary (Last 24 hours) at 01/20/16 1638 Last data filed at 01/20/16 1500  Gross per 24 hour  Intake    500 ml  Output    351 ml  Net    149 ml     Physical Exam  Awake Alert, in NAD Supple Neck, no goiter Symmetrical Chest wall movement, Good air movement bilaterally, no wheezes RRR,No Gallops,Rubs  +ve B.Sounds, Abd Soft, No tenderness, No organomegaly appriciated, No rebound - guarding or rigidity. No Clubbing    Data Review:    CBC  Recent Labs Lab 01/14/16 0625 01/15/16 0400 01/16/16 0400 01/17/16 0230 01/20/16 1007  WBC 13.9* 14.7* 11.7* 10.0 9.2  HGB 7.3* 7.5* 7.6* 8.6* 9.4*  HCT 25.2* 26.0* 26.8* 29.8* 32.1*  PLT 194 228 308 307 576*  MCV 81.0 82.3 82.7 81.4 80.3  MCH 23.5* 23.7* 23.5* 23.5* 23.5*  MCHC 29.0* 28.8* 28.4* 28.9* 29.3*  RDW 18.6* 18.4* 18.5* 18.3* 17.6*    Chemistries   Recent Labs Lab 01/14/16 0115  01/14/16 0625 01/14/16 1420 01/15/16  0400  01/17/16 0230 01/17/16 2048 01/18/16 0748 01/19/16 0641 01/20/16 1007  NA  --   < > 144  --  152*  < > 153* 150* 150* 149* 133*  K  --   < > 2.9*  --  2.8*  < > 3.0* 3.3* 3.6 3.0* 2.6*  CL  --   < > 92*  --  101  < > 101 98* 98* 96* 84*  CO2  --   < > 37*  --  39*  < > 39* 36* 38* 39* 34*  GLUCOSE  --   < > 354*  --  139*  < > 172* 413* 341* 310* 528*  BUN  --   < > 39*  --  38*  < > 31* 26* 28* 26* 16  CREATININE  --   < > 1.06*  --  0.86  < > 0.69 0.86 0.79 0.84 0.92  CALCIUM  --   < > 7.7*   --  8.2*  < > 9.4 9.2 9.3 9.4 8.7*  MG 2.1  --  2.1 2.2 2.0  --   --   --   --   --   --   < > = values in this interval not displayed. ------------------------------------------------------------------------------------------------------------------ No results for input(s): CHOL, HDL, LDLCALC, TRIG, CHOLHDL, LDLDIRECT in the last 72 hours.  Lab Results  Component Value Date   HGBA1C 7.5* 01/11/2016   ------------------------------------------------------------------------------------------------------------------ No results for input(s): TSH, T4TOTAL, T3FREE, THYROIDAB in the last 72 hours.  Invalid input(s): FREET3 ------------------------------------------------------------------------------------------------------------------ No results for input(s): VITAMINB12, FOLATE, FERRITIN, TIBC, IRON, RETICCTPCT in the last 72 hours.  Coagulation profile No results for input(s): INR, PROTIME in the last 168 hours.  No results for input(s): DDIMER in the last 72 hours.  Cardiac Enzymes  Recent Labs Lab 01/13/16 1800 01/14/16 0115  TROPONINI 0.09* 0.09*   ------------------------------------------------------------------------------------------------------------------    Component Value Date/Time   BNP 289.7* 01/14/2016 1002    Inpatient Medications  Scheduled Meds: . antiseptic oral rinse  7 mL Mouth Rinse q12n4p  . ARIPiprazole  5 mg Oral Daily  . aspirin  81 mg Oral Daily  . chlorhexidine  15 mL Mouth Rinse BID  . furosemide  80 mg Intravenous BID  . heparin  5,000 Units Subcutaneous Q8H  . insulin aspart  0-15 Units Subcutaneous TID WC  . insulin glargine  22 Units Subcutaneous Daily  . levothyroxine  62.5 mcg Intravenous Daily  . metoprolol  5 mg Intravenous Q6H  . metronidazole  500 mg Intravenous Q8H  . multivitamin with minerals  1 tablet Oral Daily  . [START ON 01/21/2016] pantoprazole  40 mg Oral Daily  . simvastatin  10 mg Oral q1800  . sodium chloride flush  3  mL Intravenous Q12H  . valproic acid  500 mg Per Tube BID   Continuous Infusions:  PRN Meds:.sodium chloride, ipratropium-albuterol, midazolam, RESOURCE THICKENUP CLEAR, sodium chloride flush  Micro Results Recent Results (from the past 240 hour(s))  Culture, Urine     Status: Abnormal   Collection Time: 01/11/16  3:00 PM  Result Value Ref Range Status   Specimen Description URINE, CATHETERIZED  Final   Special Requests NONE  Final   Culture (A)  Final    >=100,000 COLONIES/mL ESCHERICHIA COLI 40,000 COLONIES/mL KLEBSIELLA PNEUMONIAE    Report Status 01/13/2016 FINAL  Final   Organism ID, Bacteria ESCHERICHIA COLI (A)  Final   Organism ID, Bacteria KLEBSIELLA PNEUMONIAE (A)  Final  Susceptibility   Escherichia coli - MIC*    AMPICILLIN <=2 SENSITIVE Sensitive     CEFAZOLIN <=4 SENSITIVE Sensitive     CEFTRIAXONE <=1 SENSITIVE Sensitive     CIPROFLOXACIN >=4 RESISTANT Resistant     GENTAMICIN <=1 SENSITIVE Sensitive     IMIPENEM <=0.25 SENSITIVE Sensitive     NITROFURANTOIN <=16 SENSITIVE Sensitive     TRIMETH/SULFA <=20 SENSITIVE Sensitive     AMPICILLIN/SULBACTAM <=2 SENSITIVE Sensitive     PIP/TAZO <=4 SENSITIVE Sensitive     * >=100,000 COLONIES/mL ESCHERICHIA COLI   Klebsiella pneumoniae - MIC*    AMPICILLIN >=32 RESISTANT Resistant     CEFAZOLIN <=4 SENSITIVE Sensitive     CEFTRIAXONE <=1 SENSITIVE Sensitive     CIPROFLOXACIN <=0.25 SENSITIVE Sensitive     GENTAMICIN <=1 SENSITIVE Sensitive     IMIPENEM <=0.25 SENSITIVE Sensitive     NITROFURANTOIN 64 INTERMEDIATE Intermediate     TRIMETH/SULFA <=20 SENSITIVE Sensitive     AMPICILLIN/SULBACTAM 4 SENSITIVE Sensitive     PIP/TAZO <=4 SENSITIVE Sensitive     * 40,000 COLONIES/mL KLEBSIELLA PNEUMONIAE  Culture, respiratory (NON-Expectorated)     Status: None   Collection Time: 01/12/16 10:25 AM  Result Value Ref Range Status   Specimen Description TRACHEAL ASPIRATE  Final   Special Requests NONE  Final   Gram  Stain   Final    ABUNDANT WBC PRESENT,BOTH PMN AND MONONUCLEAR FEW SQUAMOUS EPITHELIAL CELLS PRESENT ABUNDANT GRAM POSITIVE COCCI IN PAIRS IN CLUSTERS Performed at [facility]    Culture   Final    ABUNDANT STAPHYLOCOCCUS AUREUS Note: RIFAMPIN AND GENTAMICIN SHOULD NOT BE USED AS SINGLE DRUGS FOR TREATMENT OF STAPH INFECTIONS. Performed at [facility]    Report Status 01/15/2016 FINAL  Final   Organism ID, Bacteria STAPHYLOCOCCUS AUREUS  Final      Susceptibility   Staphylococcus aureus - MIC*    CLINDAMYCIN <=0.25 SENSITIVE Sensitive     ERYTHROMYCIN <=0.25 SENSITIVE Sensitive     GENTAMICIN <=0.5 SENSITIVE Sensitive     LEVOFLOXACIN 0.25 SENSITIVE Sensitive     OXACILLIN 0.5 SENSITIVE Sensitive     RIFAMPIN <=0.5 SENSITIVE Sensitive     TRIMETH/SULFA <=10 SENSITIVE Sensitive     VANCOMYCIN <=0.5 SENSITIVE Sensitive     TETRACYCLINE <=1 SENSITIVE Sensitive     MOXIFLOXACIN <=0.25 SENSITIVE Sensitive     * ABUNDANT STAPHYLOCOCCUS AUREUS  Culture, blood (Routine X 2) w Reflex to ID Panel     Status: None   Collection Time: 01/13/16  6:00 PM  Result Value Ref Range Status   Specimen Description BLOOD LEFT ARM  Final   Special Requests BOTTLES DRAWN AEROBIC AND ANAEROBIC 5CC  Final   Culture NO GROWTH 5 DAYS  Final   Report Status 01/18/2016 FINAL  Final  Culture, blood (Routine X 2) w Reflex to ID Panel     Status: None   Collection Time: 01/13/16  6:10 PM  Result Value Ref Range Status   Specimen Description BLOOD LEFT HAND  Final   Special Requests IN PEDIATRIC BOTTLE 3CC  Final   Culture NO GROWTH 5 DAYS  Final   Report Status 01/18/2016 FINAL  Final  C difficile quick scan w PCR reflex     Status: Abnormal   Collection Time: 01/14/16  9:58 PM  Result Value Ref Range Status   C Diff antigen POSITIVE (A) NEGATIVE Final   C Diff toxin NEGATIVE NEGATIVE Final   C Diff interpretation  Final    C. difficile present, but toxin not detected. This indicates  colonization. In most cases, this does not require treatment. If patient has signs and symptoms consistent with colitis, consider treatment. Requires ENTERIC precautions.  Clostridium Difficile by PCR     Status: None   Collection Time: 01/14/16  9:58 PM  Result Value Ref Range Status   Toxigenic C Difficile by pcr NEGATIVE NEGATIVE Final    Radiology Reports Dg Chest 2 View  01/08/2016  CLINICAL DATA:  Shortness of breath and weakness EXAM: CHEST  2 VIEW COMPARISON:  January 06, 2016 FINDINGS: There is interstitial edema with cardiomegaly and mild pulmonary venous hypertension. There is patchy atelectasis in both lower lung zones. There is no airspace consolidation. No adenopathy. The patient is status post left total shoulder replacement as well as postoperative change in the proximal right humerus. There is postoperative change in the lower cervical region as well. IMPRESSION: Findings consistent with a degree of congestive heart failure. Areas of atelectasis in both lower lung zones. No airspace consolidation. Electronically Signed   By: Bretta Bang III M.D.   On: 01/08/2016 19:05   Dg Chest 2 View  01/06/2016  CLINICAL DATA:  55 year old female with a several hour history of dry cough and dyspnea. Medical history includes COPD and CHF. EXAM: CHEST  2 VIEW COMPARISON:  Prior chest x-ray 01/04/2016 FINDINGS: Left IJ approach central venous catheter remains in stable position with the tip projecting over the superior cavoatrial junction. The feeding tube is been removed. Stable cardiac and mediastinal contours. Improved pulmonary edema compared to prior imaging. There is still some persistent pulmonary vascular congestion. No new focal airspace consolidation, large effusion or evidence of pneumothorax. Incompletely imaged cervical thoracic stabilization hardware, left shoulder arthroplasty prosthesis and ORIF of a right proximal humerus fracture. IMPRESSION: 1. Improved pulmonary edema compared to  01/04/2016. There is mild residual pulmonary vascular congestion. 2. Stable cardiomegaly. 3. No new or acute cardiopulmonary process. Electronically Signed   By: Malachy Moan M.D.   On: 01/06/2016 10:29   Dg Chest 2 View  12/22/2015  CLINICAL DATA:  Acute onset of bilateral leg pain and shortness of breath. Wounds on both legs. Initial encounter. EXAM: CHEST  2 VIEW COMPARISON:  Chest radiograph performed 10/16/2015 FINDINGS: The lungs are well-aerated. Mild vascular congestion is noted. Mildly increased interstitial markings may reflect mild interstitial edema. No pleural effusion or pneumothorax is seen. The heart is mildly enlarged. No acute osseous abnormalities are seen. Hardware is noted along the proximal humerus bilaterally. Cervical spinal fusion hardware is partially imaged. IMPRESSION: Mild vascular congestion noted. Mildly increased interstitial markings may reflect mild interstitial edema. Mild cardiomegaly noted. Electronically Signed   By: Roanna Raider M.D.   On: 12/22/2015 22:53   Ct Head Wo Contrast  01/01/2016  CLINICAL DATA:  Acute encephalopathy. EXAM: CT HEAD WITHOUT CONTRAST TECHNIQUE: Contiguous axial images were obtained from the base of the skull through the vertex without intravenous contrast. COMPARISON:  CT scan of October 16, 2015. FINDINGS: Fluid is noted in the mastoid air cells bilaterally. Bony calvarium appears intact. Stable diffuse cortical atrophy is noted. Mild chronic ischemic white matter disease is noted. No mass effect or midline shift is noted. Ventricular size is within normal limits. There is no evidence of mass lesion, hemorrhage or acute infarction. IMPRESSION: Stable diffuse cortical atrophy. Mild chronic ischemic white matter disease. No acute intracranial abnormality seen. Electronically Signed   By: Lupita Raider, M.D.  On: 01/01/2016 15:31   Ct Chest Wo Contrast  01/14/2016  CLINICAL DATA:  Acute respiratory distress requiring intubation 3 days  ago. Worsening hypoxemia. EXAM: CT CHEST WITHOUT CONTRAST TECHNIQUE: Multidetector CT imaging of the chest was performed following the standard protocol without IV contrast. COMPARISON:  Radiographs 01/14/2016 and 01/13/2016.  CT 09/11/2013. FINDINGS: Mediastinum/Nodes: Small mediastinal and hilar lymph nodes bilaterally are similar to the prior CT. Hilar assessment is limited by the lack of intravenous contrastand perihilar pulmonary opacities.There is no axillary adenopathy. Endotracheal tube, nasogastric tube and right IJ central venous catheter are in place. There is mild cardiomegaly. No significant pericardial fluid. Mild atherosclerosis is present. Lungs/Pleura: Small bilateral pleural effusions. There are patchy airspace opacities dependently in both lungs with mild volume loss in the lower lobes. There are patchy ground-glass opacities throughout the aerated lungs. No endobronchial lesion or suspicious pulmonary nodule. Upper abdomen: The visualized upper abdomen demonstrates no suspicious findings. Musculoskeletal/Chest wall: There is mild generalized edema within the chest wall. No acute osseous findings are seen. There is stable chronic sternal deformity. There are postsurgical changes in the lower cervical spine and both proximal humeri. IMPRESSION: 1. Dependent airspace opacities in both lungs with associated volume loss, similar to recent radiographs. Although likely in part secondary to atelectasis, distribution is suspicious for infection, possibly on the basis of aspiration. 2. Ground-glass opacities in the aerated lungs consistent with mild edema. 3. Small bilateral pleural effusions. Electronically Signed   By: Carey Bullocks M.D.   On: 01/14/2016 17:14   Dg Chest Port 1 View  01/18/2016  CLINICAL DATA:  Acute respiratory failure with hypoxia. Shortness of breath today. EXAM: PORTABLE CHEST 1 VIEW COMPARISON:  01/17/2016 and 01/16/2016 FINDINGS: 0717 hours. Interval extubation and removal of  the nasogastric tube. There is stable cardiomegaly. Pulmonary edema has improved, although there is an asymmetric component in the right upper lobe which appears slightly worse. No pneumothorax or significant pleural effusion is seen. Old rib fractures are present on the left. There are postsurgical changes in the cervical spine and both humeri. IMPRESSION: Overall improvement in pulmonary edema following extubation. There is asymmetric airspace disease in the right upper lobe which has mildly progressed. This may reflect asymmetric edema or developing infiltrate. Electronically Signed   By: Carey Bullocks M.D.   On: 01/18/2016 07:33   Dg Chest Port 1 View  01/17/2016  CLINICAL DATA:  CHF. EXAM: PORTABLE CHEST 1 VIEW COMPARISON:  01/16/2016. FINDINGS: Interim removal right IJ line. Endotracheal tube, NG tube in stable position . Cardiomegaly. Persistent but improving bilateral patchy pulmonary infiltrates and/or edema. No pleural effusion or pneumothorax. Prior cervical spine fusion. Postsurgical changes both shoulders. IMPRESSION: 1. Stable cardiomegaly. 2. Persistent but slightly improving patchy bilateral pulmonary infiltrates and/or pulmonary edema. Electronically Signed   By: Maisie Fus  Register   On: 01/17/2016 07:23   Dg Chest Port 1 View  01/16/2016  CLINICAL DATA:  Pneumonia EXAM: PORTABLE CHEST 1 VIEW COMPARISON:  January 15, 2016 FINDINGS: The heart size and mediastinal contours are stable. The heart size is enlarged. Endotracheal tube is identified distal tip 5 cm from carina. Right central venous line is noted distal tip in the superior vena cava. Nasogastric tube is identified distal tip is not included on film but is at least in the stomach. There is persistent pulmonary vascular congestion with enlargement of perihilar central pulmonary vessels unchanged. Mild hazy opacity of bilateral lung bases are identified unchanged. There is no pleural effusion. The visualized skeletal structures are  stable.  IMPRESSION: Mild central pulmonary vascular congestion unchanged compared to prior exam. Mild hazy airspace disease in bilateral lung bases not significantly changed compared to prior exam. Electronically Signed   By: Sherian Rein M.D.   On: 01/16/2016 07:22   Dg Chest Port 1 View  01/15/2016  CLINICAL DATA:  Respiratory failure EXAM: PORTABLE CHEST 1 VIEW COMPARISON:  01/14/2016 FINDINGS: Central line endotracheal tube and orogastric tube appear unchanged. Mild cardiac silhouette enlargement stable. There is vascular congestion with indistinct perihilar vasculature bilaterally. Hazy density at both lung bases again identified. IMPRESSION: Bilateral airspace disease is mildly improved when compared to prior study. Electronically Signed   By: Esperanza Heir M.D.   On: 01/15/2016 07:19   Dg Chest Port 1 View  01/14/2016  CLINICAL DATA:  Acute respiratory failure. On ventilator. Congestive heart failure. COPD. EXAM: PORTABLE CHEST 1 VIEW COMPARISON:  01/13/2016 FINDINGS: Patient is rotated to the right. Endotracheal tube, nasogastric tube, and right jugular central venous catheter remain in appropriate position. Cardiomegaly stable. Symmetric bilateral airspace disease with bibasilar predominance shows no significant change, most likely due to pulmonary edema or ARDS. No pneumothorax visualized. No definite pleural effusion noted on this portable exam. IMPRESSION: Stable cardiomegaly and symmetric bilateral airspace disease, most likely due to pulmonary edema or ARDS. Electronically Signed   By: Myles Rosenthal M.D.   On: 01/14/2016 08:32   Dg Chest Port 1 View  01/12/2016  CLINICAL DATA:  Encounter for central line placement EXAM: PORTABLE CHEST 1 VIEW COMPARISON:  Portable exam 1628 hours compared to 01/12/2016 FINDINGS: Tip of endotracheal tube projects 5.6 cm above carina. Nasogastric tube extends into stomach. New RIGHT jugular central venous catheter with tip projecting over SVC. Enlargement of cardiac  silhouette with pulmonary vascular congestion. BILATERAL pulmonary infiltrates question pulmonary edema. Suspect small bibasilar effusions. No pneumothorax. Prior cervical spine fusion. IMPRESSION: No pneumothorax following RIGHT jugular line placement. Enlargement of cardiac silhouette with vascular congestion and suspected BILATERAL pulmonary edema. Electronically Signed   By: Ulyses Southward M.D.   On: 01/12/2016 16:50   Dg Chest Port 1 View  01/12/2016  CLINICAL DATA:  Respiratory distress, recent intubation EXAM: PORTABLE CHEST 1 VIEW COMPARISON:  01/12/2016 FINDINGS: Cardiac shadow is stable. Bibasilar densities are again seen and stable. Mild vascular congestion is again noted. A nasogastric catheter is now noted within the stomach. An endotracheal tube is seen approximately 3.8 cm above the carina. Postsurgical changes are noted in both shoulders and lower cervical spine. No acute bony abnormality is seen. IMPRESSION: Stable appearance of the chest. The endotracheal tube and nasogastric catheter are in satisfactory position. Electronically Signed   By: Alcide Clever M.D.   On: 01/12/2016 10:43   Dg Chest Port 1 View  01/12/2016  CLINICAL DATA:  Acute renal failure ; congestive heart failure EXAM: PORTABLE CHEST 1 VIEW COMPARISON:  January 11, 2016 FINDINGS: There is interstitial edema throughout the lungs with patchy alveolar edema in the bases. There is cardiomegaly with pulmonary venous hypertension. There is a equivocal small pleural effusions bilaterally. There is postoperative change in the proximal right humerus. There is a total shoulder replacement on the left. There is postoperative change in the lower cervical spine. IMPRESSION: Congestive heart failure persists. Superimposed pneumonia in the bases cannot be excluded radiographically. Appearance is quite similar to 1 day prior. Electronically Signed   By: Bretta Bang III M.D.   On: 01/12/2016 07:29   Dg Chest Port 1 View  01/11/2016   CLINICAL  DATA:  Shortness of breath today.  Initial encounter. EXAM: PORTABLE CHEST 1 VIEW COMPARISON:  PA and lateral chest 01/08/2016 and 01/06/2016. FINDINGS: Cardiomegaly and pulmonary edema are seen. There are likely bilateral pleural effusions. Aeration is markedly worsened compared to the prior study. No pneumothorax is identified. Left shoulder replacement is noted. The patient is status post fixation of a proximal humerus fracture and cervical fusion. IMPRESSION: Marked worsening in aeration most consistent with congestive heart failure. Electronically Signed   By: Drusilla Kanner M.D.   On: 01/11/2016 15:11   Dg Chest Port 1 View  01/04/2016  CLINICAL DATA:  Respiratory failure. EXAM: PORTABLE CHEST 1 VIEW COMPARISON:  01/02/2016 . FINDINGS: Feeding tube and and left IJ line in stable position. Cardiomegaly with persistent diffuse bilateral pulmonary interstitial changes consistent congestive heart failure again noted. Basilar atelectasis and/or infiltrates again noted. Small bilateral pleural effusions. No pneumothorax. Prior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

## 2016-01-21 DIAGNOSIS — J42 Unspecified chronic bronchitis: Secondary | ICD-10-CM

## 2016-01-21 LAB — GLUCOSE, CAPILLARY
GLUCOSE-CAPILLARY: 327 mg/dL — AB (ref 65–99)
GLUCOSE-CAPILLARY: 427 mg/dL — AB (ref 65–99)
GLUCOSE-CAPILLARY: 156 mg/dL — AB (ref 65–99)
Glucose-Capillary: 233 mg/dL — ABNORMAL HIGH (ref 65–99)
Glucose-Capillary: 123 mg/dL — ABNORMAL HIGH (ref 65–99)

## 2016-01-21 LAB — BASIC METABOLIC PANEL
ANION GAP: 12 (ref 5–15)
BUN: 11 mg/dL (ref 6–20)
CALCIUM: 8.9 mg/dL (ref 8.9–10.3)
CO2: 37 mmol/L — AB (ref 22–32)
Chloride: 89 mmol/L — ABNORMAL LOW (ref 101–111)
Creatinine, Ser: 0.63 mg/dL (ref 0.44–1.00)
GFR calc Af Amer: >60 mL/min (ref >60)
GFR calc non Af Amer: >60 mL/min (ref >60)
GLUCOSE: 161 mg/dL — AB (ref 65–99)
POTASSIUM: 2.7 mmol/L — AB (ref 3.5–5.1)
Sodium: 138 mmol/L (ref 135–145)

## 2016-01-21 LAB — MAGNESIUM: Magnesium: 1.6 mg/dL — ABNORMAL LOW (ref 1.7–2.4)

## 2016-01-21 MED ORDER — POTASSIUM CHLORIDE CRYS ER 20 MEQ PO TBCR
40.0000 meq | EXTENDED_RELEASE_TABLET | Freq: Two times a day (BID) | ORAL | Status: AC
  Administered 2016-01-21 – 2016-01-22 (×2): 40 meq via ORAL
  Filled 2016-01-21 (×2): qty 2

## 2016-01-21 MED ORDER — LEVOTHYROXINE SODIUM 25 MCG PO TABS
125.0000 ug | ORAL_TABLET | Freq: Every day | ORAL | Status: DC
  Administered 2016-01-22 – 2016-01-26 (×5): 125 ug via ORAL
  Filled 2016-01-21 (×5): qty 1

## 2016-01-21 MED ORDER — MAGNESIUM SULFATE IN D5W 1-5 GM/100ML-% IV SOLN
1.0000 g | Freq: Once | INTRAVENOUS | Status: AC
  Administered 2016-01-21: 1 g via INTRAVENOUS
  Filled 2016-01-21: qty 100

## 2016-01-21 NOTE — Progress Notes (Signed)
PROGRESS NOTE                                                                                                                                                                                                             Patient Demographics:    Pamalee LeydenMary Ress, is a 55 y.o. female, DOB - [birth year], BJY:[id number]  Admit date - 01/11/2016   Admitting Physician Ozella Rocksavid J Merrell, MD  Outpatient Primary MD for the patient is Dorrene GermanEdwin A Avbuere, MD  LOS - 10  No chief complaint on file.      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  Patient was recently admitted on 4/1 for AoCHF and COPDex. She had to be intubated, as she decompensated from respiratory standpoint as it was thought to be due to ARDS secondary to aspiration. She improved slowly and was transferred to to inpt rehab on 4/18.  She presented this time with acute respiratory failure secondary to acute diastolic congestive heart failure with last echo showing EF of 60% with grade 2 diastolic dysfunction. [name] on home lasix 40 mg BID , but was only taking 40 mg daily while on inpt rehab, so likely this contributed to the exacerbation. She was started on lasix, and on 4/21, rn was having difficulty keeping the patient's sats above 90 despite Bipap. Pt had to be emergently intubated, and PCCM was asked to take over.    Subjective:    Pamalee LeydenMary Barberi    Assessment  & Plan :    Principal Problem:   Acute on chronic respiratory failure (HCC) - secondary staph pneumonia. Improved after 2 days of zosyn and 5 of cefazolin, totaling 7 days of antibiotic treatment.  Active Problems:   Diabetes mellitus, insulin dependent (IDDM), uncontrolled (HCC) - Continue SSI and increase Lantus  to 22 units SQ daily    HTN (hypertension) - stable on metoprolol    COPD (chronic obstructive pulmonary disease) (HCC) - stable no wheezes    Physical deconditioning  - PT evaluation    GERD without esophagitis - stable continue ppi    Bipolar I disorder (HCC) - stable    Acute on chronic diastolic CHF (  congestive heart failure), NYHA class 1 (HCC) - currently on lasix. Will continue current regimen    Seizures (HCC) - stable on valproic acid  Hypomagnesemia - replace IV and reassess next am.  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  Code Status : Full   Family Communication  : d/c patient directly  Disposition Plan  : pending improvement in respiratory condition and evaluation by PT  Barriers For Discharge : Pt will need evaluation of Rheumatic mitral valve stenosis  Consults  :  Cardiology  Procedures  :  SIGNIFICANT EVENTS: 4/26 extubated  LINES/TUBES: ETT 4/21>4/26  DVT Prophylaxis  :  Heparin   Lab Results  Component Value Date   PLT 576* 01/20/2016    Antibiotics  :  Flagyl  Anti-infectives    Start     Dose/Rate Route Frequency Ordered Stop   01/15/16 2200  ceFAZolin (ANCEF) IVPB 2g/100 mL premix     2 g 200 mL/hr over 30 Minutes Intravenous Every 8 hours 01/15/16 2126 01/19/16 2236   01/15/16 1015  ceFAZolin (ANCEF) IVPB 2g/100 mL premix     2 g 200 mL/hr over 30 Minutes Intravenous Every 8 hours 01/15/16 1000 01/15/16 1155   01/15/16 0945  metroNIDAZOLE (FLAGYL) IVPB 500 mg     500 mg 100 mL/hr over 60 Minutes Intravenous Every 8 hours 01/15/16 0944 01/25/16 2359   01/13/16 1600  piperacillin-tazobactam (ZOSYN) IVPB 3.375 g  Status:  Discontinued     3.375 g 12.5 mL/hr over 240 Minutes Intravenous Every 8 hours 01/13/16 1445 01/15/16 0941   01/13/16  1600  vancomycin (VANCOCIN) 1,250 mg in sodium chloride 0.9 % 250 mL IVPB  Status:  Discontinued     1,250 mg 166.7 mL/hr over 90 Minutes Intravenous Every 12 hours 01/13/16 1445 01/15/16 1425        Objective:   Filed Vitals:   01/20/16 1506 01/20/16 2246 01/21/16 0533 01/21/16 1450  BP: 109/55 100/61 111/64 106/55  Pulse: 72 73 72 70  Temp: 98.7 F (37.1 C) 97.9 F (36.6 C) 97.7 F (36.5 C) 98.3 F (36.8 C)  TempSrc: Oral Oral Oral Oral  Resp: 18 20 18 20   Weight:   83.5 kg (184 lb 1.4 oz)   SpO2: 100% 97% 99% 100%    Wt Readings from Last 3 Encounters:  01/21/16 83.5 kg (184 lb 1.4 oz)  01/11/16 101.9 kg (224 lb 10.4 oz)  01/09/16 103.42 kg (228 lb)     Intake/Output Summary (Last 24 hours) at 01/21/16 1614 Last data filed at 01/21/16 1330  Gross per 24 hour  Intake    560 ml  Output    403 ml  Net    157 ml     Physical Exam  Awake Alert, in NAD Supple Neck, no goiter Symmetrical Chest wall movement, Good air movement bilaterally, no wheezes RRR,No Gallops,Rubs  +ve B.Sounds, Abd Soft, No tenderness, No organomegaly appriciated, No rebound - guarding or rigidity. No Clubbing    Data Review:    CBC  Recent Labs Lab 01/15/16 0400 01/16/16 0400 01/17/16 0230 01/20/16 1007  WBC 14.7* 11.7* 10.0 9.2  HGB 7.5* 7.6* 8.6* 9.4*  HCT 26.0* 26.8* 29.8* 32.1*  PLT 228 308 307 576*  MCV 82.3 82.7 81.4 80.3  MCH 23.7* 23.5* 23.5* 23.5*  MCHC 28.8* 28.4*  28.9* 29.3*  RDW 18.4* 18.5* 18.3* 17.6*    Chemistries   Recent Labs Lab 01/15/16 0400  01/17/16 2048 01/18/16 0748 01/19/16 0641 01/20/16 1007 01/21/16 1434  NA 152*  < > 150* 150* 149* 133* 138  K 2.8*  < > 3.3* 3.6 3.0* 2.6* 2.7*  CL 101  < > 98* 98* 96* 84* 89*  CO2 39*  < > 36* 38* 39* 34* 37*  GLUCOSE 139*  < > 413* 341* 310* 528* 161*  BUN 38*  < > 26* 28* 26* 16 11  CREATININE 0.86  < > 0.86 0.79 0.84 0.92 0.63  CALCIUM 8.2*  < > 9.2 9.3 9.4 8.7* 8.9  MG 2.0  --   --   --   --   --   1.6*  < > = values in this interval not displayed. ------------------------------------------------------------------------------------------------------------------ No results for input(s): CHOL, HDL, LDLCALC, TRIG, CHOLHDL, LDLDIRECT in the last 72 hours.  Lab Results  Component Value Date   HGBA1C 7.5* 01/11/2016   ------------------------------------------------------------------------------------------------------------------ No results for input(s): TSH, T4TOTAL, T3FREE, THYROIDAB in the last 72 hours.  Invalid input(s): FREET3 ------------------------------------------------------------------------------------------------------------------ No results for input(s): VITAMINB12, FOLATE, FERRITIN, TIBC, IRON, RETICCTPCT in the last 72 hours.  Coagulation profile No results for input(s): INR, PROTIME in the last 168 hours.  No results for input(s): DDIMER in the last 72 hours.  Cardiac Enzymes No results for input(s): CKMB, TROPONINI, MYOGLOBIN in the last 168 hours.  Invalid input(s): CK ------------------------------------------------------------------------------------------------------------------    Component Value Date/Time   BNP 289.7* 01/14/2016 1002    Inpatient Medications  Scheduled Meds: . antiseptic oral rinse  7 mL Mouth Rinse q12n4p  . ARIPiprazole  5 mg Oral Daily  . aspirin  81 mg Oral Daily  . chlorhexidine  15 mL Mouth Rinse BID  . furosemide  80 mg Intravenous BID  . heparin  5,000 Units Subcutaneous Q8H  . insulin aspart  0-15 Units Subcutaneous TID WC  . insulin glargine  22 Units Subcutaneous Daily  . [START ON 01/22/2016] levothyroxine  125 mcg Oral QAC breakfast  . magnesium sulfate 1 - 4 g bolus IVPB  1 g Intravenous Once  . metoprolol  5 mg Intravenous Q6H  . metronidazole  500 mg Intravenous Q8H  . multivitamin with minerals  1 tablet Oral Daily  . pantoprazole  40 mg Oral Daily  . potassium chloride  40 mEq Oral BID  . simvastatin  10 mg  Oral q1800  . sodium chloride flush  3 mL Intravenous Q12H  . valproic acid  500 mg Per Tube BID   Continuous Infusions:  PRN Meds:.sodium chloride, acetaminophen, ipratropium-albuterol, midazolam, RESOURCE THICKENUP CLEAR, sodium chloride flush  Micro Results Recent Results (from the past 240 hour(s))  Culture, respiratory (NON-Expectorated)     Status: None   Collection Time: 01/12/16 10:25 AM  Result Value Ref Range Status   Specimen Description TRACHEAL ASPIRATE  Final   Special Requests NONE  Final   Gram Stain   Final    ABUNDANT WBC PRESENT,BOTH PMN AND MONONUCLEAR FEW SQUAMOUS EPITHELIAL CELLS PRESENT ABUNDANT GRAM POSITIVE COCCI IN PAIRS IN CLUSTERS Performed at [facility]    Culture   Final    ABUNDANT STAPHYLOCOCCUS AUREUS Note: RIFAMPIN AND GENTAMICIN SHOULD NOT BE USED AS SINGLE DRUGS FOR TREATMENT OF STAPH INFECTIONS. Performed at [facility]    Report Status 01/15/2016 FINAL  Final   Organism ID, Bacteria STAPHYLOCOCCUS AUREUS  Final  Susceptibility   Staphylococcus aureus - MIC*    CLINDAMYCIN <=0.25 SENSITIVE Sensitive     ERYTHROMYCIN <=0.25 SENSITIVE Sensitive     GENTAMICIN <=0.5 SENSITIVE Sensitive     LEVOFLOXACIN 0.25 SENSITIVE Sensitive     OXACILLIN 0.5 SENSITIVE Sensitive     RIFAMPIN <=0.5 SENSITIVE Sensitive     TRIMETH/SULFA <=10 SENSITIVE Sensitive     VANCOMYCIN <=0.5 SENSITIVE Sensitive     TETRACYCLINE <=1 SENSITIVE Sensitive     MOXIFLOXACIN <=0.25 SENSITIVE Sensitive     * ABUNDANT STAPHYLOCOCCUS AUREUS  Culture, blood (Routine X 2) w Reflex to ID Panel     Status: None   Collection Time: 01/13/16  6:00 PM  Result Value Ref Range Status   Specimen Description BLOOD LEFT ARM  Final   Special Requests BOTTLES DRAWN AEROBIC AND ANAEROBIC 5CC  Final   Culture NO GROWTH 5 DAYS  Final   Report Status 01/18/2016 FINAL  Final  Culture, blood (Routine X 2) w Reflex to ID Panel     Status: None   Collection Time:  01/13/16  6:10 PM  Result Value Ref Range Status   Specimen Description BLOOD LEFT HAND  Final   Special Requests IN PEDIATRIC BOTTLE 3CC  Final   Culture NO GROWTH 5 DAYS  Final   Report Status 01/18/2016 FINAL  Final  C difficile quick scan w PCR reflex     Status: Abnormal   Collection Time: 01/14/16  9:58 PM  Result Value Ref Range Status   C Diff antigen POSITIVE (A) NEGATIVE Final   C Diff toxin NEGATIVE NEGATIVE Final   C Diff interpretation   Final    C. difficile present, but toxin not detected. This indicates colonization. In most cases, this does not require treatment. If patient has signs and symptoms consistent with colitis, consider treatment. Requires ENTERIC precautions.  Clostridium Difficile by PCR     Status: None   Collection Time: 01/14/16  9:58 PM  Result Value Ref Range Status   Toxigenic C Difficile by pcr NEGATIVE NEGATIVE Final    Radiology Reports Dg Chest 2 View  01/08/2016  CLINICAL DATA:  Shortness of breath and weakness EXAM: CHEST  2 VIEW COMPARISON:  January 06, 2016 FINDINGS: There is interstitial edema with cardiomegaly and mild pulmonary venous hypertension. There is patchy atelectasis in both lower lung zones. There is no airspace consolidation. No adenopathy. The patient is status post left total shoulder replacement as well as postoperative change in the proximal right humerus. There is postoperative change in the lower cervical region as well. IMPRESSION: Findings consistent with a degree of congestive heart failure. Areas of atelectasis in both lower lung zones. No airspace consolidation. Electronically Signed   By: Bretta Bang III M.D.   On: 01/08/2016 19:05   Dg Chest 2 View  01/06/2016  CLINICAL DATA:  55 year old female with a several hour history of dry cough and dyspnea. Medical history includes COPD and CHF. EXAM: CHEST  2 VIEW COMPARISON:  Prior chest x-ray 01/04/2016 FINDINGS: Left IJ approach central venous catheter remains in stable  position with the tip projecting over the superior cavoatrial junction. The feeding tube is been removed. Stable cardiac and mediastinal contours. Improved pulmonary edema compared to prior imaging. There is still some persistent pulmonary vascular congestion. No new focal airspace consolidation, large effusion or evidence of pneumothorax. Incompletely imaged cervical thoracic stabilization hardware, left shoulder arthroplasty prosthesis and ORIF of a right proximal humerus fracture. IMPRESSION: 1. Improved pulmonary edema  compared to 01/04/2016. There is mild residual pulmonary vascular congestion. 2. Stable cardiomegaly. 3. No new or acute cardiopulmonary process. Electronically Signed   By: Malachy Moan M.D.   On: 01/06/2016 10:29   Dg Chest 2 View  12/22/2015  CLINICAL DATA:  Acute onset of bilateral leg pain and shortness of breath. Wounds on both legs. Initial encounter. EXAM: CHEST  2 VIEW COMPARISON:  Chest radiograph performed 10/16/2015 FINDINGS: The lungs are well-aerated. Mild vascular congestion is noted. Mildly increased interstitial markings may reflect mild interstitial edema. No pleural effusion or pneumothorax is seen. The heart is mildly enlarged. No acute osseous abnormalities are seen. Hardware is noted along the proximal humerus bilaterally. Cervical spinal fusion hardware is partially imaged. IMPRESSION: Mild vascular congestion noted. Mildly increased interstitial markings may reflect mild interstitial edema. Mild cardiomegaly noted. Electronically Signed   By: Roanna Raider M.D.   On: 12/22/2015 22:53   Ct Head Wo Contrast  01/01/2016  CLINICAL DATA:  Acute encephalopathy. EXAM: CT HEAD WITHOUT CONTRAST TECHNIQUE: Contiguous axial images were obtained from the base of the skull through the vertex without intravenous contrast. COMPARISON:  CT scan of October 16, 2015. FINDINGS: Fluid is noted in the mastoid air cells bilaterally. Bony calvarium appears intact. Stable diffuse  cortical atrophy is noted. Mild chronic ischemic white matter disease is noted. No mass effect or midline shift is noted. Ventricular size is within normal limits. There is no evidence of mass lesion, hemorrhage or acute infarction. IMPRESSION: Stable diffuse cortical atrophy. Mild chronic ischemic white matter disease. No acute intracranial abnormality seen. Electronically Signed   By: Lupita Raider, M.D.   On: 01/01/2016 15:31   Ct Chest Wo Contrast  01/14/2016  CLINICAL DATA:  Acute respiratory distress requiring intubation 3 days ago. Worsening hypoxemia. EXAM: CT CHEST WITHOUT CONTRAST TECHNIQUE: Multidetector CT imaging of the chest was performed following the standard protocol without IV contrast. COMPARISON:  Radiographs 01/14/2016 and 01/13/2016.  CT 09/11/2013. FINDINGS: Mediastinum/Nodes: Small mediastinal and hilar lymph nodes bilaterally are similar to the prior CT. Hilar assessment is limited by the lack of intravenous contrastand perihilar pulmonary opacities.There is no axillary adenopathy. Endotracheal tube, nasogastric tube and right IJ central venous catheter are in place. There is mild cardiomegaly. No significant pericardial fluid. Mild atherosclerosis is present. Lungs/Pleura: Small bilateral pleural effusions. There are patchy airspace opacities dependently in both lungs with mild volume loss in the lower lobes. There are patchy ground-glass opacities throughout the aerated lungs. No endobronchial lesion or suspicious pulmonary nodule. Upper abdomen: The visualized upper abdomen demonstrates no suspicious findings. Musculoskeletal/Chest wall: There is mild generalized edema within the chest wall. No acute osseous findings are seen. There is stable chronic sternal deformity. There are postsurgical changes in the lower cervical spine and both proximal humeri. IMPRESSION: 1. Dependent airspace opacities in both lungs with associated volume loss, similar to recent radiographs. Although likely  in part secondary to atelectasis, distribution is suspicious for infection, possibly on the basis of aspiration. 2. Ground-glass opacities in the aerated lungs consistent with mild edema. 3. Small bilateral pleural effusions. Electronically Signed   By: Carey Bullocks M.D.   On: 01/14/2016 17:14   Dg Chest Port 1 View  01/18/2016  CLINICAL DATA:  Acute respiratory failure with hypoxia. Shortness of breath today. EXAM: PORTABLE CHEST 1 VIEW COMPARISON:  01/17/2016 and 01/16/2016 FINDINGS: 0717 hours. Interval extubation and removal of the nasogastric tube. There is stable cardiomegaly. Pulmonary edema has improved, although there is an asymmetric  component in the right upper lobe which appears slightly worse. No pneumothorax or significant pleural effusion is seen. Old rib fractures are present on the left. There are postsurgical changes in the cervical spine and both humeri. IMPRESSION: Overall improvement in pulmonary edema following extubation. There is asymmetric airspace disease in the right upper lobe which has mildly progressed. This may reflect asymmetric edema or developing infiltrate. Electronically Signed   By: Carey Bullocks M.D.   On: 01/18/2016 07:33   Dg Chest Port 1 View  01/17/2016  CLINICAL DATA:  CHF. EXAM: PORTABLE CHEST 1 VIEW COMPARISON:  01/16/2016. FINDINGS: Interim removal right IJ line. Endotracheal tube, NG tube in stable position . Cardiomegaly. Persistent but improving bilateral patchy pulmonary infiltrates and/or edema. No pleural effusion or pneumothorax. Prior cervical spine fusion. Postsurgical changes both shoulders. IMPRESSION: 1. Stable cardiomegaly. 2. Persistent but slightly improving patchy bilateral pulmonary infiltrates and/or pulmonary edema. Electronically Signed   By: Maisie Fus  Register   On: 01/17/2016 07:23   Dg Chest Port 1 View  01/16/2016  CLINICAL DATA:  Pneumonia EXAM: PORTABLE CHEST 1 VIEW COMPARISON:  January 15, 2016 FINDINGS: The heart size and mediastinal  contours are stable. The heart size is enlarged. Endotracheal tube is identified distal tip 5 cm from carina. Right central venous line is noted distal tip in the superior vena cava. Nasogastric tube is identified distal tip is not included on film but is at least in the stomach. There is persistent pulmonary vascular congestion with enlargement of perihilar central pulmonary vessels unchanged. Mild hazy opacity of bilateral lung bases are identified unchanged. There is no pleural effusion. The visualized skeletal structures are stable. IMPRESSION: Mild central pulmonary vascular congestion unchanged compared to prior exam. Mild hazy airspace disease in bilateral lung bases not significantly changed compared to prior exam. Electronically Signed   By: Sherian Rein M.D.   On: 01/16/2016 07:22   Dg Chest Port 1 View  01/15/2016  CLINICAL DATA:  Respiratory failure EXAM: PORTABLE CHEST 1 VIEW COMPARISON:  01/14/2016 FINDINGS: Central line endotracheal tube and orogastric tube appear unchanged. Mild cardiac silhouette enlargement stable. There is vascular congestion with indistinct perihilar vasculature bilaterally. Hazy density at both lung bases again identified. IMPRESSION: Bilateral airspace disease is mildly improved when compared to prior study. Electronically Signed   By: Esperanza Heir M.D.   On: 01/15/2016 07:19   Dg Chest Port 1 View  01/14/2016  CLINICAL DATA:  Acute respiratory failure. On ventilator. Congestive heart failure. COPD. EXAM: PORTABLE CHEST 1 VIEW COMPARISON:  01/13/2016 FINDINGS: Patient is rotated to the right. Endotracheal tube, nasogastric tube, and right jugular central venous catheter remain in appropriate position. Cardiomegaly stable. Symmetric bilateral airspace disease with bibasilar predominance shows no significant change, most likely due to pulmonary edema or ARDS. No pneumothorax visualized. No definite pleural effusion noted on this portable exam. IMPRESSION: Stable  cardiomegaly and symmetric bilateral airspace disease, most likely due to pulmonary edema or ARDS. Electronically Signed   By: Myles Rosenthal M.D.   On: 01/14/2016 08:32   Dg Chest Port 1 View  01/12/2016  CLINICAL DATA:  Encounter for central line placement EXAM: PORTABLE CHEST 1 VIEW COMPARISON:  Portable exam 1628 hours compared to 01/12/2016 FINDINGS: Tip of endotracheal tube projects 5.6 cm above carina. Nasogastric tube extends into stomach. New RIGHT jugular central venous catheter with tip projecting over SVC. Enlargement of cardiac silhouette with pulmonary vascular congestion. BILATERAL pulmonary infiltrates question pulmonary edema. Suspect small bibasilar effusions. No pneumothorax. Prior cervical  spine fusion. IMPRESSION: No pneumothorax following RIGHT jugular line placement. Enlargement of cardiac silhouette with vascular congestion and suspected BILATERAL pulmonary edema. Electronically Signed   By: Ulyses Southward M.D.   On: 01/12/2016 16:50   Dg Chest Port 1 View  01/12/2016  CLINICAL DATA:  Respiratory distress, recent intubation EXAM: PORTABLE CHEST 1 VIEW COMPARISON:  01/12/2016 FINDINGS: Cardiac shadow is stable. Bibasilar densities are again seen and stable. Mild vascular congestion is again noted. A nasogastric catheter is now noted within the stomach. An endotracheal tube is seen approximately 3.8 cm above the carina. Postsurgical changes are noted in both shoulders and lower cervical spine. No acute bony abnormality is seen. IMPRESSION: Stable appearance of the chest. The endotracheal tube and nasogastric catheter are in satisfactory position. Electronically Signed   By: Alcide Clever M.D.   On: 01/12/2016 10:43   Dg Chest Port 1 View  01/12/2016  CLINICAL DATA:  Acute renal failure ; congestive heart failure EXAM: PORTABLE CHEST 1 VIEW COMPARISON:  January 11, 2016 FINDINGS: There is interstitial edema throughout the lungs with patchy alveolar edema in the bases. There is cardiomegaly with  pulmonary venous hypertension. There is a equivocal small pleural effusions bilaterally. There is postoperative change in the proximal right humerus. There is a total shoulder replacement on the left. There is postoperative change in the lower cervical spine. IMPRESSION: Congestive heart failure persists. Superimposed pneumonia in the bases cannot be excluded radiographically. Appearance is quite similar to 1 day prior. Electronically Signed   By: Bretta Bang III M.D.   On: 01/12/2016 07:29   Dg Chest Port 1 View  01/11/2016  CLINICAL DATA:  Shortness of breath today.  Initial encounter. EXAM: PORTABLE CHEST 1 VIEW COMPARISON:  PA and lateral chest 01/08/2016 and 01/06/2016. FINDINGS: Cardiomegaly and pulmonary edema are seen. There are likely bilateral pleural effusions. Aeration is markedly worsened compared to the prior study. No pneumothorax is identified. Left shoulder replacement is noted. The patient is status post fixation of a proximal humerus fracture and cervical fusion. IMPRESSION: Marked worsening in aeration most consistent with congestive heart failure. Electronically Signed   By: Drusilla Kanner M.D.   On: 01/11/2016 15:11   Dg Chest Port 1 View  01/04/2016  CLINICAL DATA:  Respiratory failure. EXAM: PORTABLE CHEST 1 VIEW COMPARISON:  01/02/2016 . FINDINGS: Feeding tube and and left IJ line in stable position. Cardiomegaly with persistent diffuse bilateral pulmonary interstitial changes consistent congestive heart failure again noted. Basilar atelectasis and/or infiltrates again noted. Small bilateral pleural effusions. No pneumothorax. Prior cervical spine fusion. Postsurgical changes both shoulders again noted . IMPRESSION: 1. Lines and tubes in stable position. 2. Persistent changes of congestive heart failure with bilateral pulmonary interstitial edema. Basilar atelectasis and/or infiltrates again noted . Small bilateral effusions again noted. Electronically Signed   By: Maisie Fus   Register   On: 01/04/2016 07:15   Dg Chest Port 1 View  01/02/2016  CLINICAL DATA:  55 year old female with encephalopathy. Shortness of breath. Respiratory failure. ARDS. Initial encounter. EXAM: PORTABLE CHEST 1 VIEW COMPARISON:  01/01/2016 and earlier. FINDINGS: Portable AP semi upright view at 0515 hours. The endotracheal tube tip is just above the clavicles. Enteric tube courses to the abdomen, tip not included. Stable left IJ approach central line. Multiple EKG wires are looped over the chest. Ventilation has not significantly changed since 12/30/2015. There is dense retrocardiac opacity obscuring the left hemidiaphragm. There is superimposed patchy perihilar and infrahilar opacity. No pneumothorax. Stable pulmonary vascularity. Small pleural  effusions are possible. Stable postoperative changes about both shoulders. Cervical ACDF hardware again noted. IMPRESSION: 1.  Stable lines and tubes. 2. Stable ventilation since 12/30/2015 with bilateral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

## 2016-01-21 NOTE — Progress Notes (Signed)
Subjective:  [name] feeling better today and able to lay flat.  Not short of breath.  No chest pain.  Objective:  Vital Signs in the last 24 hours: BP 111/64 mmHg  Pulse 72  Temp(Src) 97.7 F (36.5 C) (Oral)  Resp 18  Wt 83.5 kg (184 lb 1.4 oz)  SpO2 99%  Physical Exam: Elderly obese white female in no acute distress Lungs:  Clear  Cardiac:  Regular rhythm, normal S1 and S2, no S3 Abdomen:  Soft, nontender, no masses Extremities:  No edema present  Intake/Output from previous day: 04/29 0701 - 04/30 0700 In: 320 [P.O.:120; IV Piggyback:200] Out: 4 [Urine:1; Stool:3] Weight Filed Weights   01/19/16 0348 01/20/16 0531 01/21/16 0533  Weight: 86.9 kg (191 lb 9.3 oz) 84.9 kg (187 lb 2.7 oz) 83.5 kg (184 lb 1.4 oz)    Lab Results: Basic Metabolic Panel:  Recent Labs  16/06/9603/28/17 0641 01/20/16 1007  NA 149* 133*  K 3.0* 2.6*  CL 96* 84*  CO2 39* 34*  GLUCOSE 310* 528*  BUN 26* 16  CREATININE 0.84 0.92    CBC:  Recent Labs  01/20/16 1007  WBC 9.2  HGB 9.4*  HCT 32.1*  MCV 80.3  PLT 576*    BNP    Component Value Date/Time   BNP 289.7* 01/14/2016 1002    PROTIME: Lab Results  Component Value Date   INR 1.15 10/16/2015   INR 1.0 08/10/2008   INR 1.0 07/19/2008    Telemetry: Sinus rhythm  Assessment/Plan:  1.  Severe mitral stenosis by echo earlier this month. 2.  Recent acute respiratory failure likely due to accommodation of pneumonia as well as mitral stenosis. 3.  Obesity  Recommendations:  Discussed TEE with her and she is agreeable.  We'll keep nothing by mouth after midnight and see if this can be arranged.      Darden PalmerW. Spencer Adrea Sherpa, Jr.  MD [facility] Cardiology  01/21/2016, 12:59 PM

## 2016-01-21 NOTE — Progress Notes (Signed)
CRITICAL VALUE ALERT  Critical value received:  Potassium 2.7  Date of notification:  01/21/16  Time of notification:  15:45  Critical value read back:Yes.    Nurse who received alert:  Midge AverVicki Valoria Tamburri RN   MD notified (1st page):  Dr. Cena BentonVega  Time of first page:  16:00  MD notified (2nd page):  Time of second page:  Responding MD:  Dr. Cena BentonVega  Time MD responded:

## 2016-01-21 NOTE — Clinical Social Work Note (Signed)
Clinical Social Work Assessment  Patient Details  Name: [name] MRN: [id number] Date of Birth: 09-21-1961  Date of referral:  01/21/16               Reason for consult:  Discharge Planning                Permission sought to share information with:  Family Supports Permission granted to share information::  Yes, Verbal Permission Granted  Name::     Diplomatic Services operational officer::     Relationship::  husband  Contact Information:     Housing/Transportation Living arrangements for the past 2 months:  Single Family Home Source of Information:  Patient Patient Interpreter Needed:  None Criminal Activity/Legal Involvement Pertinent to Current Situation/Hospitalization:  No - Comment as needed Significant Relationships:  Adult Children, Spouse Lives with:  Adult Children, Spouse Do you feel safe going back to the place where you live?  Yes Need for family participation in patient care:  No (Coment)  Care giving concerns:  PT recommends SNF   Social Worker assessment / plan:  CSW met with pt at bedside in order to discuss DC plans. Pt currently lives at home with husband, 2 dtrs, and 2 grandchildren. Pt reports she has been to SNF in the past and does not want to return. Pt has an aide that comes for 3 hours daily and has 24/7 supervision from family. Pt has equipment such as wheelchair, walker, and shower chair at home. Pt stated that she feels confident in returning home with family and is not interested in SNF process.  CSW is signing off but available if needed.  Employment status:  Disabled (Comment on whether or not currently receiving Disability) Insurance information:  Medicaid In [place] PT Recommendations:  [place] / Referral to community resources:  [place]  Patient/Family's Response to care:  Pt alert and oriented and engaged in assessment.  Patient/Family's Understanding of and Emotional Response to Diagnosis, Current Treatment, and  Prognosis:  Pt is nervous about procedures in the hospital and is hopeful to DC back home shortly.  Emotional Assessment Appearance:  Appears older than stated age Attitude/Demeanor/Rapport:  Other (Calm) Affect (typically observed):  Appropriate Orientation:  Oriented to Self, Oriented to Place, Oriented to  Time, Oriented to Situation Alcohol / Substance use:  Not Applicable Psych involvement (Current and /or in the community):  No (Comment)  Discharge Needs  Concerns to be addressed:  No discharge needs identified Readmission within the last 30 days:  Yes Current discharge risk:  None Barriers to Discharge:  No Barriers Identified   [place] Master, [place] 01/21/2016, 4:13 PM Weekend Coverage

## 2016-01-22 ENCOUNTER — Encounter (HOSPITAL_COMMUNITY): Payer: Self-pay | Admitting: *Deleted

## 2016-01-22 ENCOUNTER — Encounter (HOSPITAL_COMMUNITY)
Admission: AD | Disposition: A | Discharge status: Home Health | Payer: Self-pay | Source: Intra-hospital | Attending: Family Medicine

## 2016-01-22 ENCOUNTER — Inpatient Hospital Stay (HOSPITAL_COMMUNITY)
Admission: AD | Admit: 2016-01-22 | Discharge: 2016-01-22 | Disposition: A | Discharge status: Home / Self Care | Payer: Medicaid Other | Source: Intra-hospital | Attending: Cardiovascular Disease | Admitting: Cardiovascular Disease

## 2016-01-22 DIAGNOSIS — I340 Nonrheumatic mitral (valve) insufficiency: Secondary | ICD-10-CM

## 2016-01-22 HISTORY — PX: TEE WITHOUT CARDIOVERSION: SHX5443

## 2016-01-22 LAB — BASIC METABOLIC PANEL
Anion gap: 14 (ref 5–15)
BUN: 9 mg/dL (ref 6–20)
CO2: 35 mmol/L — AB (ref 22–32)
Calcium: 8.8 mg/dL — ABNORMAL LOW (ref 8.9–10.3)
Chloride: 89 mmol/L — ABNORMAL LOW (ref 101–111)
Creatinine, Ser: 0.65 mg/dL (ref 0.44–1.00)
GFR calc Af Amer: >60 mL/min (ref >60)
GFR calc non Af Amer: >60 mL/min (ref >60)
GLUCOSE: 219 mg/dL — AB (ref 65–99)
POTASSIUM: 3.2 mmol/L — AB (ref 3.5–5.1)
Sodium: 138 mmol/L (ref 135–145)

## 2016-01-22 LAB — GLUCOSE, CAPILLARY
Glucose-Capillary: 279 mg/dL — ABNORMAL HIGH (ref 65–99)
Glucose-Capillary: 193 mg/dL — ABNORMAL HIGH (ref 65–99)
Glucose-Capillary: 233 mg/dL — ABNORMAL HIGH (ref 65–99)

## 2016-01-22 SURGERY — ECHOCARDIOGRAM, TRANSESOPHAGEAL
Anesthesia: Moderate Sedation

## 2016-01-22 MED ORDER — LACTATED RINGERS IV SOLN
INTRAVENOUS | Status: DC
  Administered 2016-01-22: 1000 mL via INTRAVENOUS

## 2016-01-22 MED ORDER — LOPERAMIDE HCL 2 MG PO CAPS
2.0000 mg | ORAL_CAPSULE | Freq: Once | ORAL | Status: DC
Start: 2016-01-22 — End: 2016-01-23
  Filled 2016-01-22: qty 1

## 2016-01-22 MED ORDER — FUROSEMIDE 40 MG PO TABS
40.0000 mg | ORAL_TABLET | Freq: Two times a day (BID) | ORAL | Status: DC
Start: 2016-01-22 — End: 2016-01-26
  Administered 2016-01-23 – 2016-01-26 (×7): 40 mg via ORAL
  Filled 2016-01-22 (×8): qty 1

## 2016-01-22 MED ORDER — FENTANYL CITRATE (PF) 100 MCG/2ML IJ SOLN
INTRAMUSCULAR | Status: DC | PRN
  Administered 2016-01-22 (×2): 25 ug via INTRAVENOUS

## 2016-01-22 MED ORDER — FENTANYL CITRATE (PF) 100 MCG/2ML IJ SOLN
INTRAMUSCULAR | Status: AC
  Filled 2016-01-22: qty 2

## 2016-01-22 MED ORDER — MIDAZOLAM HCL 10 MG/2ML IJ SOLN
INTRAMUSCULAR | Status: DC | PRN
  Administered 2016-01-22 (×2): 2 mg via INTRAVENOUS

## 2016-01-22 MED ORDER — MIDAZOLAM HCL 5 MG/ML IJ SOLN
INTRAMUSCULAR | Status: AC
  Filled 2016-01-22: qty 2

## 2016-01-22 MED ORDER — DIPHENHYDRAMINE HCL 50 MG/ML IJ SOLN
INTRAMUSCULAR | Status: AC
  Filled 2016-01-22: qty 1

## 2016-01-22 MED ORDER — POTASSIUM CHLORIDE CRYS ER 20 MEQ PO TBCR
40.0000 meq | EXTENDED_RELEASE_TABLET | Freq: Once | ORAL | Status: DC
  Filled 2016-01-22: qty 2

## 2016-01-22 NOTE — CV Procedure (Signed)
    Transesophageal Echocardiogram Note  Evaristo BuryMary J Ciresi [id number] 05-15-1961  Procedure: Transesophageal Echocardiogram Indications: mitral stenosis   Procedure Details Consent: Obtained Time Out: Verified patient identification, verified procedure, site/side was marked, verified correct patient position, special equipment/implants available, Radiology Safety Procedures followed,  medications/allergies/relevent history reviewed, required imaging and test results available.  Performed  Medications:  During this procedure the patient is administered a total of Versed 4  mg and Fentanyl 50 mcg  to achieve and maintain moderate conscious sedation.  The patient's heart rate, blood pressure, and oxygen saturation are monitored continuously during the procedure. The period of conscious sedation is 30  minutes, of which I was present face-to-face 100% of this time.  Left Ventrical:  Normal LV function  Mitral Valve: moderate MS.   Valve area measures 1.2 cm2.  Mean MV gradient of 8 mmHg  Aortic Valve: normal AV  Tricuspid Valve: mild TR  Pulmonic Valve: trivial PI   Left Atrium/ Left atrial appendage: no thrombus   Atrial septum: no PFO by color of bubble study   Aorta: mild calcified plaque   Complications: No apparent complications Patient did tolerate procedure well.   Vesta MixerPhilip J. Nahser, Montez HagemanJr., MD, [facility] 01/22/2016, 10:31 AM

## 2016-01-22 NOTE — Progress Notes (Signed)
SLP Cancellation Note  Patient Details Name: [name] Drakes MRN: [id number] DOB: Nov 12, 1960   Cancelled treatment:       Reason Eval/Treat Not Completed: Patient at procedure or test/unavailable   Samiel Peel, Riley NearingBonnie Caroline 01/22/2016, 9:48 AM

## 2016-01-22 NOTE — H&P (View-Only) (Signed)
Subjective:  [name] feeling better today and able to lay flat.  Not short of breath.  No chest pain.  Objective:  Vital Signs in the last 24 hours: BP 111/64 mmHg  Pulse 72  Temp(Src) 97.7 F (36.5 C) (Oral)  Resp 18  Wt 83.5 kg (184 lb 1.4 oz)  SpO2 99%  Physical Exam: Elderly obese white female in no acute distress Lungs:  Clear  Cardiac:  Regular rhythm, normal S1 and S2, no S3 Abdomen:  Soft, nontender, no masses Extremities:  No edema present  Intake/Output from previous day: 04/29 0701 - 04/30 0700 In: 320 [P.O.:120; IV Piggyback:200] Out: 4 [Urine:1; Stool:3] Weight Filed Weights   01/19/16 0348 01/20/16 0531 01/21/16 0533  Weight: 86.9 kg (191 lb 9.3 oz) 84.9 kg (187 lb 2.7 oz) 83.5 kg (184 lb 1.4 oz)    Lab Results: Basic Metabolic Panel:  Recent Labs  45/40/9804/28/17 0641 01/20/16 1007  NA 149* 133*  K 3.0* 2.6*  CL 96* 84*  CO2 39* 34*  GLUCOSE 310* 528*  BUN 26* 16  CREATININE 0.84 0.92    CBC:  Recent Labs  01/20/16 1007  WBC 9.2  HGB 9.4*  HCT 32.1*  MCV 80.3  PLT 576*    BNP    Component Value Date/Time   BNP 289.7* 01/14/2016 1002    PROTIME: Lab Results  Component Value Date   INR 1.15 10/16/2015   INR 1.0 08/10/2008   INR 1.0 07/19/2008    Telemetry: Sinus rhythm  Assessment/Plan:  1.  Severe mitral stenosis by echo earlier this month. 2.  Recent acute respiratory failure likely due to accommodation of pneumonia as well as mitral stenosis. 3.  Obesity  Recommendations:  Discussed TEE with her and she is agreeable.  We'll keep nothing by mouth after midnight and see if this can be arranged.      Darden PalmerW. Spencer Tilley, Jr.  MD [facility] Cardiology  01/21/2016, 12:59 PM

## 2016-01-22 NOTE — Progress Notes (Signed)
Inpatient Diabetes Program Recommendations  AACE/ADA: New Consensus Statement on Inpatient Glycemic Control (2015)  Target Ranges:  Prepandial:   less than 140 mg/dL      Peak postprandial:   less than 180 mg/dL (1-2 hours)      Critically ill patients:  140 - 180 mg/dL   Results for [name], [name] (MRN [id number]) as of 01/22/2016 09:24  Ref. Range 01/21/2016 07:49 01/21/2016 12:07 01/21/2016 16:22 01/21/2016 21:31  Glucose-Capillary Latest Ref Range: 65-99 mg/dL 409427 (H) 811233 (H) 914123 (H) 156 (H)   Results for [name], [name] (MRN [id number]) as of 01/22/2016 09:24  Ref. Range 01/22/2016 08:23  Glucose-Capillary Latest Ref Range: 65-99 mg/dL 086279 (H)    Home DM Meds: Lantus 10 units daily       Novolog 6-15 units tidwc  Current Insulin Orders: Lantus 22 units daily      Novolog Moderate Correction Scale/ SSI (0-15 units) TID AC      MD- Note patient continues to have elevated fasting glucose levels.  Please consider further increase of Lantus to 25 units daily (approximately 0.3 units/kg dosing)     --Will follow patient during hospitalization--  Ambrose FinlandJeannine Johnston Amaka Gluth RN, MSN, CDE Diabetes Coordinator Inpatient Glycemic Control Team Team Pager: [phone] (8a-5p)

## 2016-01-22 NOTE — Progress Notes (Signed)
Patient Name: Elizabeth BuryMary J [name] Date of Encounter: 01/22/2016  Principal Problem:   Acute on chronic respiratory failure (HCC) Active Problems:   Diabetes mellitus, insulin dependent (IDDM), uncontrolled (HCC)   HTN (hypertension)   COPD (chronic obstructive pulmonary disease) (HCC)   Physical deconditioning   GERD without esophagitis   Bipolar I disorder (HCC)   Acute on chronic diastolic CHF (congestive heart failure), NYHA class 1 (HCC)   Seizures (HCC)   Acute respiratory failure with hypoxia (HCC)   Respiratory failure (HCC)   Hyperlipidemia   Rheumatic mitral stenosis   Primary Cardiologist: Dr. Patty SermonsBrackbill  Patient Profile: 55 year old female with a past medical history of COPD, diastolic CHF, severe mitral stenosis (mean gradient 20 mmHg) 2/2 rheumatic fever, DM, and  Bipolar disorder. Presented to the ED via EMS from SNF on 01/11/16, she was somnolent, and required intubation. Volume overloaded and found to have PNA.  TEE done today to assess mitral valve stenosis.    SUBJECTIVE: Feels tired, wants to lay down.  Denies SOB or chest pain.   OBJECTIVE Filed Vitals:   01/22/16 1020 01/22/16 1025 01/22/16 1038 01/22/16 1050  BP: 114/47 115/63 102/48 98/46  Pulse: 65 70 68 71  Temp:      TempSrc:      Resp: 22 21 21 21   Weight:      SpO2: 100% 98% 96% 99%    Intake/Output Summary (Last 24 hours) at 01/22/16 1156 Last data filed at 01/21/16 1856  Gross per 24 hour  Intake    640 ml  Output      0 ml  Net    640 ml   Filed Weights   01/20/16 0531 01/21/16 0533 01/22/16 0448  Weight: 187 lb 2.7 oz (84.9 kg) 184 lb 1.4 oz (83.5 kg) 190 lb 0.6 oz (86.2 kg)    PHYSICAL EXAM General: Well developed, well nourished, obese female in no acute distress. Head: Normocephalic, atraumatic.  Neck: Supple without bruits,No JVD. Lungs:  Resp regular and unlabored, coarse rhonchi in all lobes.  Heart: RRR, S1, S2, no S3, S4, or murmur; no rub. Abdomen: Soft, non-tender,  non-distended, BS + x 4.  Extremities: No clubbing, cyanosis, generalized edema.  Neuro: Alert and oriented X 3. Moves all extremities spontaneously. Psych: Normal affect.  LABS: CBC: Recent Labs  01/20/16 1007  WBC 9.2  HGB 9.4*  HCT 32.1*  MCV 80.3  PLT 576*   Basic Metabolic Panel: Recent Labs  01/21/16 1434 01/22/16 0556  NA 138 138  K 2.7* 3.2*  CL 89* 89*  CO2 37* 35*  GLUCOSE 161* 219*  BUN 11 9  CREATININE 0.63 0.65  CALCIUM 8.9 8.8*  MG 1.6*  --     BNP:  B NATRIURETIC PEPTIDE  Date/Time Value Ref Range Status  01/14/2016 10:02 AM 289.7* 0.0 - 100.0 pg/mL Final  01/12/2016 05:07 PM 821.4* 0.0 - 100.0 pg/mL Final     Current facility-administered medications:  .  0.9 %  sodium chloride infusion, 250 mL, Intravenous, PRN, Ozella Rocksavid J Merrell, MD, Stopped at 01/17/16 1133 .  acetaminophen (TYLENOL) tablet 650 mg, 650 mg, Oral, Q6H PRN, Rolan Lipahomas Dinita Migliaccio Callahan, NP, 650 mg at 01/21/16 2207 .  antiseptic oral rinse (CPC / CETYLPYRIDINIUM CHLORIDE 0.05%) solution 7 mL, 7 mL, Mouth Rinse, q12n4p, Praveen Mannam, MD, 7 mL at 01/21/16 1600 .  ARIPiprazole (ABILIFY) tablet 5 mg, 5 mg, Oral, Daily, Ozella Rocksavid J Merrell, MD, 5 mg at 01/21/16 0819 .  aspirin chewable tablet 81 mg, 81 mg, Oral, Daily, Ozella Rocks, MD, 81 mg at 01/21/16 1610 .  chlorhexidine (PERIDEX) 0.12 % solution 15 mL, 15 mL, Mouth Rinse, BID, Praveen Mannam, MD, 15 mL at 01/21/16 2157 .  furosemide (LASIX) injection 80 mg, 80 mg, Intravenous, BID, Lennart Pall [place], [place], 80 mg at 01/21/16 1717 .  heparin injection 5,000 Units, 5,000 Units, Subcutaneous, Q8H, Ozella Rocks, MD, 5,000 Units at 01/22/16 0440 .  insulin aspart (novoLOG) injection 0-15 Units, 0-15 Units, Subcutaneous, TID WC, Penny Pia, MD, 8 Units at 01/22/16 [phone] .  insulin glargine (LANTUS) injection 22 Units, 22 Units, Subcutaneous, Daily, Penny Pia, MD, 22 Units at 01/21/16 [phone]  ipratropium-albuterol (DUONEB) 0.5-2.5 (3) MG/3ML  nebulizer solution 3 mL, 3 mL, Nebulization, Q4H PRN, Ozella Rocks, MD .  levothyroxine (SYNTHROID, LEVOTHROID) tablet 125 mcg, 125 mcg, Oral, QAC breakfast, Tamera Reason, [facility] .  metoprolol (LOPRESSOR) injection 5 mg, 5 mg, Intravenous, Q6H, Tonny Bollman, MD, 5 mg at 01/22/16 0438 .  metroNIDAZOLE (FLAGYL) IVPB 500 mg, 500 mg, Intravenous, Q8H, Praveen Mannam, MD, 500 mg at 01/22/16 0037 .  midazolam (VERSED) injection 1-4 mg, 1-4 mg, Intravenous, Q2H PRN, Kalman Shan, MD, 4 mg at 01/21/16 0817 .  multivitamin with minerals tablet 1 tablet, 1 tablet, Oral, Daily, Penny Pia, MD, 1 tablet at 01/21/16 0818 .  pantoprazole (PROTONIX) EC tablet 40 mg, 40 mg, Oral, Daily, Tamera Reason, RPH, 40 mg at 01/21/16 0818 .  potassium chloride SA (K-DUR,KLOR-CON) CR tablet 40 mEq, 40 mEq, Oral, BID, Penny Pia, MD, 40 mEq at 01/21/16 2157 .  RESOURCE THICKENUP CLEAR, , Oral, PRN, Penny Pia, MD .  simvastatin (ZOCOR) tablet 10 mg, 10 mg, Oral, q1800, Ozella Rocks, MD, 10 mg at 01/21/16 1717 .  sodium chloride flush (NS) 0.9 % injection 3 mL, 3 mL, Intravenous, Q12H, Ozella Rocks, MD, 3 mL at 01/21/16 0819 .  sodium chloride flush (NS) 0.9 % injection 3 mL, 3 mL, Intravenous, PRN, Ozella Rocks, MD, 3 mL at 01/16/16 1017 .  valproic acid (DEPAKENE) 250 MG/5ML syrup 500 mg, 500 mg, Per Tube, BID, Karl Ito, MD, 500 mg at 01/21/16 2153    TELE:   NSR     ECG: NSR with PAC's   Echo 12/26/15:  - Left ventricle: The cavity size was normal. Wall thickness was  normal. Systolic function was normal. The estimated ejection  fraction was in the range of 60% to 65%. Wall motion was normal;  there were no regional wall motion abnormalities. Features are  consistent with a pseudonormal left ventricular filling pattern,  with concomitant abnormal relaxation and increased filling  pressure (grade 2 diastolic dysfunction). - Mitral valve: Mobility was restricted. The findings are   consistent with severe stenosis. Valve area by pressure  half-time: 1.98 cm^2. Valve area by continuity equation (using  LVOT flow): 0.78 cm^2. - Left atrium: The atrium was moderately dilated.  Impressions:  - Technically difficult; normal LV function; grade 2 diastolic  dysfunction; moderate LAE; MV not well visualized but appears  thickened and possibly rheumatic; turbulence noted with MV  inflow; severe MS (mean gradient 20 mmHg; suggest TEE to further  assess if clinically indicated).     Current Medications:  . antiseptic oral rinse  7 mL Mouth Rinse q12n4p  . ARIPiprazole  5 mg Oral Daily  . aspirin  81 mg Oral Daily  . chlorhexidine  15 mL Mouth Rinse  BID  . furosemide  80 mg Intravenous BID  . heparin  5,000 Units Subcutaneous Q8H  . insulin aspart  0-15 Units Subcutaneous TID WC  . insulin glargine  22 Units Subcutaneous Daily  . levothyroxine  125 mcg Oral QAC breakfast  . metoprolol  5 mg Intravenous Q6H  . metronidazole  500 mg Intravenous Q8H  . multivitamin with minerals  1 tablet Oral Daily  . pantoprazole  40 mg Oral Daily  . potassium chloride  40 mEq Oral BID  . simvastatin  10 mg Oral q1800  . sodium chloride flush  3 mL Intravenous Q12H  . valproic acid  500 mg Per Tube BID      ASSESSMENT AND PLAN: Principal Problem:   Acute on chronic respiratory failure (HCC) Active Problems:   Diabetes mellitus, insulin dependent (IDDM), uncontrolled (HCC)   HTN (hypertension)   COPD (chronic obstructive pulmonary disease) (HCC)   Physical deconditioning   GERD without esophagitis   Bipolar I disorder (HCC)   Acute on chronic diastolic CHF (congestive heart failure), NYHA class 1 (HCC)   Seizures (HCC)   Acute respiratory failure with hypoxia (HCC)   Respiratory failure (HCC)   Hyperlipidemia   Rheumatic mitral stenosis  1. Rheumatic Mitral Stenosis: Evaluated by TEE today - moderate MS, valve area is 1.2cm2, mean gradient of .  Mean gradient  was by Echo, she was volume overloaded at the time of Echo.  Volume status better now, on  po Lasix now.  She does not appear massively volume overloaded on exam, her weight is up 6 pounds from yesterday, question accuracy.    2. Acute on chronic diastolic CHF: Echo report above. Has grade 2 DD, continue po Lasix.  Weights are inaccurate, only out today. Does not appear to be acutely volume overloaded at this time. Creatinine is within normal range.   3. HTN: Soft Bp's, no BP meds at this time.   Signed, Little Ishikawa , NP 11:56 AM 01/22/2016 Pager [phone]  Patient seen, examined. Available data reviewed. Agree with findings, assessment, and plan as outlined by Suzzette Righter, NP. Exam reveals alert oriented woman in NAD. Lungs with scattered rhonchi, CV: RRR without murmur, abdomen soft NT, no peripheral edema. TEE result reviewed with moderate MS noted. Plan convert lasix to PO. BP too low for beta-blocker at this point. Does not appear that intervention on mitral stenosis is indicated at this time.   Tonny Bollman, M.D. 01/22/2016 3:40 PM

## 2016-01-22 NOTE — Progress Notes (Signed)
Pt CBG 156. Order placed to page MD when CBG is greater than 140. Paged Claiborne Billingsallahan about CBG. No orders given.

## 2016-01-22 NOTE — Progress Notes (Signed)
Physical Therapy Treatment Patient Details Name: [name] MRN: [id number] DOB: 10/10/1960 Today's Date: 01/22/2016    History of Present Illness This is a 55 yo F with COPD, CHF, diabetes, mitral stenosis secondary to rheumatic fever history, diabetes, bipolar, GERD, seizures, left shoulder fracture, who came in with acute restorative failure and acute encephalopathy. Patient was recently admitted on 4/1 for AoCHF and COPDex. She had to be intubated, as she decompensated from respiratory standpoint as it was thought to be due to ARDS secondary to aspiration. She improved slowly and was transferred to to inpt rehab on 4/18. She presented this time with acute respiratory failure secondary to acute diastolic congestive heart failure with last echo showing EF of 60% with grade 2 diastolic dysfunction. [name] on home lasix 40 mg BID , but was only taking 40 mg daily while on inpt rehab, so likely this contributed to the exacerbation. She was started on lasix, and on 4/21, rn was having difficulty keeping the patient's sats above 90 despite Bipap. Pt had to be emergently intubated, and PCCM was asked to take over.     PT Comments    Patient continues to need +2 assist for transfers and declined ambulation this session. Continue to progress as tolerated with anticipated d/c to SNF for further skilled PT services.    Follow Up Recommendations  SNF;Supervision/Assistance - 24 hour     Equipment Recommendations  None recommended by PT    Recommendations for Other Services OT consult     Precautions / Restrictions Precautions Precautions: Fall Precaution Comments: 2Lo2 via [place], increased anxiety Other Brace/Splint: has special shoes Restrictions Weight Bearing Restrictions: No    Mobility  Bed Mobility Overal bed mobility: Needs Assistance Bed Mobility: Supine to Sit     Supine to sit: Min assist;HOB elevated     General bed mobility comments: min A to elevate trunk into sitting;  increased time needed but pt able to bring bilat LE to EOB and scoot hips forward   Transfers Overall transfer level: Needs assistance Equipment used: Rolling walker (2 wheeled) Transfers: Sit to/from [employer] CorporationStand;Stand Pivot Transfers Sit to Stand: Mod assist;+2 safety/equipment Stand pivot transfers: Min assist;+2 physical assistance;+2 safety/equipment       General transfer comment: sit to stands X2 and stand pivot; cues for safe hand placement and use of DME; assist to power up into standing and manage RW with pivotal steps to chair; pt became agitated after first sit to stand and reported that she "can't walk and people  keep asking me to do things I can't do"; pt able to take pivotal steps to chair and declined attempt of further mobility  Ambulation/Gait                 Stairs            Wheelchair Mobility    Modified Rankin (Stroke Patients Only)       Balance Overall balance assessment: Needs assistance Sitting-balance support: No upper extremity supported;Feet supported Sitting balance-Leahy Scale: Fair     Standing balance support: Bilateral upper extremity supported Standing balance-Leahy Scale: Poor                      Cognition Arousal/Alertness: Awake/alert Behavior During Therapy: Anxious Overall Cognitive Status: No family/caregiver present to determine baseline cognitive functioning                      Exercises General Exercises - Lower Extremity  Ankle Circles/Pumps: AROM;Both;10 reps;Supine    General Comments        Pertinent Vitals/Pain Pain Assessment: No/denies pain    Home Living                      Prior Function            PT Goals (current goals can now be found in the care plan section) Acute Rehab PT Goals Patient Stated Goal: wants to go home PT Goal Formulation: With patient Time For Goal Achievement: 02/03/16 Potential to Achieve Goals: Good Progress towards PT goals: Progressing toward  goals    Frequency  Min 3X/week    PT Plan Current plan remains appropriate    Co-evaluation             End of Session Equipment Utilized During Treatment: Gait belt;Oxygen Activity Tolerance: Patient limited by fatigue Patient left: in chair;with call bell/phone within reach;with chair alarm set     Time: 3329-5188 PT Time Calculation (min) (ACUTE ONLY): 21 min  Charges:  $Therapeutic Activity: 8-22 mins                    G Codes:      Derek M[place], PTA Pager: [phone]   01/22/2016, 12:22 PM

## 2016-01-22 NOTE — Progress Notes (Signed)
PROGRESS NOTE                                                                                                                                                                                                             Patient Demographics:    [name], is a 55 y.o. female, DOB - [birth year], ZOX:[id number]  Admit date - 01/11/2016   Admitting Physician Ozella Rocks, MD  Outpatient Primary MD for the patient is [name], MD  LOS - 11  No chief complaint on file.      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  Patient was recently admitted on 4/1 for AoCHF and COPDex. She had to be intubated, as she decompensated from respiratory standpoint as it was thought to be due to ARDS secondary to aspiration. She improved slowly and was transferred to to inpt rehab on 4/18.  She presented this time with acute respiratory failure secondary to acute diastolic congestive heart failure with last echo showing EF of 60% with grade 2 diastolic dysfunction. [name] on home lasix 40 mg BID , but was only taking 40 mg daily while on inpt rehab, so likely this contributed to the exacerbation. She was started on lasix, and on 4/21, rn was having difficulty keeping the patient's sats above 90 despite Bipap. Pt had to be emergently intubated, and PCCM was asked to take over.    Subjective:    [name]    Assessment  & Plan :    Principal Problem:   Acute on chronic respiratory failure (HCC) - secondary staph pneumonia. Improved after 2 days of zosyn and 5 of cefazolin, totaling 7 days of antibiotic treatment.  Active Problems:   Diabetes mellitus, insulin dependent (IDDM), uncontrolled (HCC) - Continue SSI and increase Lantus  to 22 units SQ daily  Hypokalemia - replace orally and reassess next am - most likely due to diarrhea    HTN (hypertension) - stable on metoprolol    COPD (chronic obstructive pulmonary disease) (HCC) - stable no wheezes    Physical deconditioning  - PT evaluation    GERD without esophagitis - stable continue ppi  Bipolar I disorder (HCC) - stable    Acute on chronic diastolic CHF (congestive heart failure), NYHA class 1 (HCC) - currently on lasix. Will continue current regimen    Seizures (HCC) - stable on valproic acid  Hypomagnesemia - replace IV and reassess next am.  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  Code Status : Full   Family Communication  : d/c patient directly  Disposition Plan  : pending improvement in respiratory condition and evaluation by PT  Barriers For Discharge : Pt will need evaluation of Rheumatic mitral valve stenosis  Consults  :  Cardiology  Procedures  :  SIGNIFICANT EVENTS: 4/26 extubated  LINES/TUBES: ETT 4/21>4/26  DVT Prophylaxis  :  Heparin   Lab Results  Component Value Date   PLT 576* 01/20/2016    Antibiotics  :  Flagyl  Anti-infectives    Start     Dose/Rate Route Frequency Ordered Stop   01/15/16 2200  ceFAZolin (ANCEF) IVPB 2g/100 mL premix     2 g 200 mL/hr over 30 Minutes Intravenous Every 8 hours 01/15/16 2126 01/19/16 2236   01/15/16 1015  ceFAZolin (ANCEF) IVPB 2g/100 mL premix     2 g 200 mL/hr over 30 Minutes Intravenous Every 8 hours 01/15/16 1000 01/15/16 1155   01/15/16 0945  metroNIDAZOLE (FLAGYL) IVPB 500 mg     500 mg 100 mL/hr over 60 Minutes Intravenous Every 8 hours 01/15/16 0944 01/25/16 2359   01/13/16 1600  piperacillin-tazobactam (ZOSYN) IVPB 3.375 g  Status:  Discontinued      3.375 g 12.5 mL/hr over 240 Minutes Intravenous Every 8 hours 01/13/16 1445 01/15/16 0941   01/13/16 1600  vancomycin (VANCOCIN) 1,250 mg in sodium chloride 0.9 % 250 mL IVPB  Status:  Discontinued     1,250 mg 166.7 mL/hr over 90 Minutes Intravenous Every 12 hours 01/13/16 1445 01/15/16 1425        Objective:   Filed Vitals:   01/22/16 1020 01/22/16 1025 01/22/16 1038 01/22/16 1050  BP: 114/47 115/63 102/48 98/46  Pulse: 65 70 68 71  Temp:      TempSrc:      Resp: 22 21 21 21   Weight:      SpO2: 100% 98% 96% 99%    Wt Readings from Last 3 Encounters:  01/22/16 86.2 kg (190 lb 0.6 oz)  01/11/16 101.9 kg (224 lb 10.4 oz)  01/09/16 103.42 kg (228 lb)     Intake/Output Summary (Last 24 hours) at 01/22/16 1628 Last data filed at 01/21/16 1856  Gross per 24 hour  Intake    400 ml  Output      0 ml  Net    400 ml     Physical Exam  Awake Alert, in NAD Supple Neck, no goiter Symmetrical Chest wall movement, Good air movement bilaterally, no wheezes RRR,No Gallops,Rubs  +ve B.Sounds, Abd Soft, No tenderness, No organomegaly appriciated, No rebound - guarding or rigidity. No Clubbing    Data Review:    CBC  Recent Labs Lab 01/16/16 0400 01/17/16 0230 01/20/16 1007  WBC 11.7* 10.0 9.2  HGB 7.6* 8.6* 9.4*  HCT 26.8* 29.8* 32.1*  PLT 308 307 576*  MCV 82.7 81.4 80.3  MCH 23.5* 23.5* 23.5*  MCHC 28.4* 28.9* 29.3*  RDW 18.5*  18.3* 17.6*    Chemistries   Recent Labs Lab 01/18/16 0748 01/19/16 0641 01/20/16 1007 01/21/16 1434 01/22/16 0556  NA 150* 149* 133* 138 138  K 3.6 3.0* 2.6* 2.7* 3.2*  CL 98* 96* 84* 89* 89*  CO2 38* 39* 34* 37* 35*  GLUCOSE 341* 310* 528* 161* 219*  BUN 28* 26* 16 11 9   CREATININE 0.79 0.84 0.92 0.63 0.65  CALCIUM 9.3 9.4 8.7* 8.9 8.8*  MG  --   --   --  1.6*  --    ------------------------------------------------------------------------------------------------------------------ No results for input(s): CHOL, HDL,  LDLCALC, TRIG, CHOLHDL, LDLDIRECT in the last 72 hours.  Lab Results  Component Value Date   HGBA1C 7.5* 01/11/2016   ------------------------------------------------------------------------------------------------------------------ No results for input(s): TSH, T4TOTAL, T3FREE, THYROIDAB in the last 72 hours.  Invalid input(s): FREET3 ------------------------------------------------------------------------------------------------------------------ No results for input(s): VITAMINB12, FOLATE, FERRITIN, TIBC, IRON, RETICCTPCT in the last 72 hours.  Coagulation profile No results for input(s): INR, PROTIME in the last 168 hours.  No results for input(s): DDIMER in the last 72 hours.  Cardiac Enzymes No results for input(s): CKMB, TROPONINI, MYOGLOBIN in the last 168 hours.  Invalid input(s): CK ------------------------------------------------------------------------------------------------------------------    Component Value Date/Time   BNP 289.7* 01/14/2016 1002    Inpatient Medications  Scheduled Meds: . antiseptic oral rinse  7 mL Mouth Rinse q12n4p  . ARIPiprazole  5 mg Oral Daily  . aspirin  81 mg Oral Daily  . chlorhexidine  15 mL Mouth Rinse BID  . furosemide  40 mg Oral BID  . heparin  5,000 Units Subcutaneous Q8H  . insulin aspart  0-15 Units Subcutaneous TID WC  . insulin glargine  22 Units Subcutaneous Daily  . levothyroxine  125 mcg Oral QAC breakfast  . metoprolol  5 mg Intravenous Q6H  . metronidazole  500 mg Intravenous Q8H  . multivitamin with minerals  1 tablet Oral Daily  . pantoprazole  40 mg Oral Daily  . simvastatin  10 mg Oral q1800  . sodium chloride flush  3 mL Intravenous Q12H  . valproic acid  500 mg Per Tube BID   Continuous Infusions:  PRN Meds:.sodium chloride, acetaminophen, ipratropium-albuterol, midazolam, RESOURCE THICKENUP CLEAR, sodium chloride flush  Micro Results Recent Results (from the past 240 hour(s))  Culture, blood  (Routine X 2) w Reflex to ID Panel     Status: None   Collection Time: 01/13/16  6:00 PM  Result Value Ref Range Status   Specimen Description BLOOD LEFT ARM  Final   Special Requests BOTTLES DRAWN AEROBIC AND ANAEROBIC 5CC  Final   Culture NO GROWTH 5 DAYS  Final   Report Status 01/18/2016 FINAL  Final  Culture, blood (Routine X 2) w Reflex to ID Panel     Status: None   Collection Time: 01/13/16  6:10 PM  Result Value Ref Range Status   Specimen Description BLOOD LEFT HAND  Final   Special Requests IN PEDIATRIC BOTTLE 3CC  Final   Culture NO GROWTH 5 DAYS  Final   Report Status 01/18/2016 FINAL  Final  C difficile quick scan w PCR reflex     Status: Abnormal   Collection Time: 01/14/16  9:58 PM  Result Value Ref Range Status   C Diff antigen POSITIVE (A) NEGATIVE Final   C Diff toxin NEGATIVE NEGATIVE Final   C Diff interpretation   Final    C. difficile present, but toxin not detected. This indicates colonization. In most cases, this does  not require treatment. If patient has signs and symptoms consistent with colitis, consider treatment. Requires ENTERIC precautions.  Clostridium Difficile by PCR     Status: None   Collection Time: 01/14/16  9:58 PM  Result Value Ref Range Status   Toxigenic C Difficile by pcr NEGATIVE NEGATIVE Final    Radiology Reports Dg Chest 2 View  01/08/2016  CLINICAL DATA:  Shortness of breath and weakness EXAM: CHEST  2 VIEW COMPARISON:  January 06, 2016 FINDINGS: There is interstitial edema with cardiomegaly and mild pulmonary venous hypertension. There is patchy atelectasis in both lower lung zones. There is no airspace consolidation. No adenopathy. The patient is status post left total shoulder replacement as well as postoperative change in the proximal right humerus. There is postoperative change in the lower cervical region as well. IMPRESSION: Findings consistent with a degree of congestive heart failure. Areas of atelectasis in both lower lung zones. No  airspace consolidation. Electronically Signed   By: Bretta Bang III M.D.   On: 01/08/2016 19:05   Dg Chest 2 View  01/06/2016  CLINICAL DATA:  55 year old female with a several hour history of dry cough and dyspnea. Medical history includes COPD and CHF. EXAM: CHEST  2 VIEW COMPARISON:  Prior chest x-ray 01/04/2016 FINDINGS: Left IJ approach central venous catheter remains in stable position with the tip projecting over the superior cavoatrial junction. The feeding tube is been removed. Stable cardiac and mediastinal contours. Improved pulmonary edema compared to prior imaging. There is still some persistent pulmonary vascular congestion. No new focal airspace consolidation, large effusion or evidence of pneumothorax. Incompletely imaged cervical thoracic stabilization hardware, left shoulder arthroplasty prosthesis and ORIF of a right proximal humerus fracture. IMPRESSION: 1. Improved pulmonary edema compared to 01/04/2016. There is mild residual pulmonary vascular congestion. 2. Stable cardiomegaly. 3. No new or acute cardiopulmonary process. Electronically Signed   By: Malachy Moan M.D.   On: 01/06/2016 10:29   Ct Head Wo Contrast  01/01/2016  CLINICAL DATA:  Acute encephalopathy. EXAM: CT HEAD WITHOUT CONTRAST TECHNIQUE: Contiguous axial images were obtained from the base of the skull through the vertex without intravenous contrast. COMPARISON:  CT scan of October 16, 2015. FINDINGS: Fluid is noted in the mastoid air cells bilaterally. Bony calvarium appears intact. Stable diffuse cortical atrophy is noted. Mild chronic ischemic white matter disease is noted. No mass effect or midline shift is noted. Ventricular size is within normal limits. There is no evidence of mass lesion, hemorrhage or acute infarction. IMPRESSION: Stable diffuse cortical atrophy. Mild chronic ischemic white matter disease. No acute intracranial abnormality seen. Electronically Signed   By: Lupita Raider, M.D.   On:  01/01/2016 15:31   Ct Chest Wo Contrast  01/14/2016  CLINICAL DATA:  Acute respiratory distress requiring intubation 3 days ago. Worsening hypoxemia. EXAM: CT CHEST WITHOUT CONTRAST TECHNIQUE: Multidetector CT imaging of the chest was performed following the standard protocol without IV contrast. COMPARISON:  Radiographs 01/14/2016 and 01/13/2016.  CT 09/11/2013. FINDINGS: Mediastinum/Nodes: Small mediastinal and hilar lymph nodes bilaterally are similar to the prior CT. Hilar assessment is limited by the lack of intravenous contrastand perihilar pulmonary opacities.There is no axillary adenopathy. Endotracheal tube, nasogastric tube and right IJ central venous catheter are in place. There is mild cardiomegaly. No significant pericardial fluid. Mild atherosclerosis is present. Lungs/Pleura: Small bilateral pleural effusions. There are patchy airspace opacities dependently in both lungs with mild volume loss in the lower lobes. There are patchy ground-glass opacities throughout the  aerated lungs. No endobronchial lesion or suspicious pulmonary nodule. Upper abdomen: The visualized upper abdomen demonstrates no suspicious findings. Musculoskeletal/Chest wall: There is mild generalized edema within the chest wall. No acute osseous findings are seen. There is stable chronic sternal deformity. There are postsurgical changes in the lower cervical spine and both proximal humeri. IMPRESSION: 1. Dependent airspace opacities in both lungs with associated volume loss, similar to recent radiographs. Although likely in part secondary to atelectasis, distribution is suspicious for infection, possibly on the basis of aspiration. 2. Ground-glass opacities in the aerated lungs consistent with mild edema. 3. Small bilateral pleural effusions. Electronically Signed   By: Carey Bullocks M.D.   On: 01/14/2016 17:14   Dg Chest Port 1 View  01/18/2016  CLINICAL DATA:  Acute respiratory failure with hypoxia. Shortness of breath  today. EXAM: PORTABLE CHEST 1 VIEW COMPARISON:  01/17/2016 and 01/16/2016 FINDINGS: 0717 hours. Interval extubation and removal of the nasogastric tube. There is stable cardiomegaly. Pulmonary edema has improved, although there is an asymmetric component in the right upper lobe which appears slightly worse. No pneumothorax or significant pleural effusion is seen. Old rib fractures are present on the left. There are postsurgical changes in the cervical spine and both humeri. IMPRESSION: Overall improvement in pulmonary edema following extubation. There is asymmetric airspace disease in the right upper lobe which has mildly progressed. This may reflect asymmetric edema or developing infiltrate. Electronically Signed   By: Carey Bullocks M.D.   On: 01/18/2016 07:33   Dg Chest Port 1 View  01/17/2016  CLINICAL DATA:  CHF. EXAM: PORTABLE CHEST 1 VIEW COMPARISON:  01/16/2016. FINDINGS: Interim removal right IJ line. Endotracheal tube, NG tube in stable position . Cardiomegaly. Persistent but improving bilateral patchy pulmonary infiltrates and/or edema. No pleural effusion or pneumothorax. Prior cervical spine fusion. Postsurgical changes both shoulders. IMPRESSION: 1. Stable cardiomegaly. 2. Persistent but slightly improving patchy bilateral pulmonary infiltrates and/or pulmonary edema. Electronically Signed   By: Maisie Fus  Register   On: 01/17/2016 07:23   Dg Chest Port 1 View  01/16/2016  CLINICAL DATA:  Pneumonia EXAM: PORTABLE CHEST 1 VIEW COMPARISON:  January 15, 2016 FINDINGS: The heart size and mediastinal contours are stable. The heart size is enlarged. Endotracheal tube is identified distal tip 5 cm from carina. Right central venous line is noted distal tip in the superior vena cava. Nasogastric tube is identified distal tip is not included on film but is at least in the stomach. There is persistent pulmonary vascular congestion with enlargement of perihilar central pulmonary vessels unchanged. Mild hazy  opacity of bilateral lung bases are identified unchanged. There is no pleural effusion. The visualized skeletal structures are stable. IMPRESSION: Mild central pulmonary vascular congestion unchanged compared to prior exam. Mild hazy airspace disease in bilateral lung bases not significantly changed compared to prior exam. Electronically Signed   By: Sherian Rein M.D.   On: 01/16/2016 07:22   Dg Chest Port 1 View  01/15/2016  CLINICAL DATA:  Respiratory failure EXAM: PORTABLE CHEST 1 VIEW COMPARISON:  01/14/2016 FINDINGS: Central line endotracheal tube and orogastric tube appear unchanged. Mild cardiac silhouette enlargement stable. There is vascular congestion with indistinct perihilar vasculature bilaterally. Hazy density at both lung bases again identified. IMPRESSION: Bilateral airspace disease is mildly improved when compared to prior study. Electronically Signed   By: Esperanza Heir M.D.   On: 01/15/2016 07:19   Dg Chest Port 1 View  01/14/2016  CLINICAL DATA:  Acute respiratory failure. On ventilator. Congestive  heart failure. COPD. EXAM: PORTABLE CHEST 1 VIEW COMPARISON:  01/13/2016 FINDINGS: Patient is rotated to the right. Endotracheal tube, nasogastric tube, and right jugular central venous catheter remain in appropriate position. Cardiomegaly stable. Symmetric bilateral airspace disease with bibasilar predominance shows no significant change, most likely due to pulmonary edema or ARDS. No pneumothorax visualized. No definite pleural effusion noted on this portable exam. IMPRESSION: Stable cardiomegaly and symmetric bilateral airspace disease, most likely due to pulmonary edema or ARDS. Electronically Signed   By: Myles RosenthalJohn  Stahl M.D.   On: 01/14/2016 08:32   Dg Chest Port 1 View  01/12/2016  CLINICAL DATA:  Encounter for central line placement EXAM: PORTABLE CHEST 1 VIEW COMPARISON:  Portable exam 1628 hours compared to 01/12/2016 FINDINGS: Tip of endotracheal tube projects 5.6 cm above carina.  Nasogastric tube extends into stomach. New RIGHT jugular central venous catheter with tip projecting over SVC. Enlargement of cardiac silhouette with pulmonary vascular congestion. BILATERAL pulmonary infiltrates question pulmonary edema. Suspect small bibasilar effusions. No pneumothorax. Prior cervical spine fusion. IMPRESSION: No pneumothorax following RIGHT jugular line placement. Enlargement of cardiac silhouette with vascular congestion and suspected BILATERAL pulmonary edema. Electronically Signed   By: Ulyses SouthwardMark  Boles M.D.   On: 01/12/2016 16:50   Dg Chest Port 1 View  01/12/2016  CLINICAL DATA:  Respiratory distress, recent intubation EXAM: PORTABLE CHEST 1 VIEW COMPARISON:  01/12/2016 FINDINGS: Cardiac shadow is stable. Bibasilar densities are again seen and stable. Mild vascular congestion is again noted. A nasogastric catheter is now noted within the stomach. An endotracheal tube is seen approximately 3.8 cm above the carina. Postsurgical changes are noted in both shoulders and lower cervical spine. No acute bony abnormality is seen. IMPRESSION: Stable appearance of the chest. The endotracheal tube and nasogastric catheter are in satisfactory position. Electronically Signed   By: Alcide CleverMark  Lukens M.D.   On: 01/12/2016 10:43   Dg Chest Port 1 View  01/12/2016  CLINICAL DATA:  Acute renal failure ; congestive heart failure EXAM: PORTABLE CHEST 1 VIEW COMPARISON:  January 11, 2016 FINDINGS: There is interstitial edema throughout the lungs with patchy alveolar edema in the bases. There is cardiomegaly with pulmonary venous hypertension. There is a equivocal small pleural effusions bilaterally. There is postoperative change in the proximal right humerus. There is a total shoulder replacement on the left. There is postoperative change in the lower cervical spine. IMPRESSION: Congestive heart failure persists. Superimposed pneumonia in the bases cannot be excluded radiographically. Appearance is quite similar to 1  day prior. Electronically Signed   By: Bretta BangWilliam  Woodruff III M.D.   On: 01/12/2016 07:29   Dg Chest Port 1 View  01/11/2016  CLINICAL DATA:  Shortness of breath today.  Initial encounter. EXAM: PORTABLE CHEST 1 VIEW COMPARISON:  PA and lateral chest 01/08/2016 and 01/06/2016. FINDINGS: Cardiomegaly and pulmonary edema are seen. There are likely bilateral pleural effusions. Aeration is markedly worsened compared to the prior study. No pneumothorax is identified. Left shoulder replacement is noted. The patient is status post fixation of a proximal humerus fracture and cervical fusion. IMPRESSION: Marked worsening in aeration most consistent with congestive heart failure. Electronically Signed   By: Drusilla Kannerhomas  Dalessio M.D.   On: 01/11/2016 15:11   Dg Chest Port 1 View  01/04/2016  CLINICAL DATA:  Respiratory failure. EXAM: PORTABLE CHEST 1 VIEW COMPARISON:  01/02/2016 . FINDINGS: Feeding tube and and left IJ line in stable position. Cardiomegaly with persistent diffuse bilateral pulmonary interstitial changes consistent congestive heart failure again noted.  Basilar atelectasis and/or infiltrates again noted. Small bilateral pleural effusions. No pneumothorax. Prior cervical spine fusion. Postsurgical changes both shoulders again noted . IMPRESSION: 1. Lines and tubes in stable position. 2. Persistent changes of congestive heart failure with bilateral pulmonary interstitial edema. Basilar atelectasis and/or infiltrates again noted . Small bilateral effusions again noted. Electronically Signed   By: Maisie Fus  Register   On: 01/04/2016 07:15   Dg Chest Port 1 View  01/02/2016  CLINICAL DATA:  55 year old female with encephalopathy. Shortness of breath. Respiratory failure. ARDS. Initial encounter. EXAM: PORTABLE CHEST 1 VIEW COMPARISON:  01/01/2016 and earlier. FINDINGS: Portable AP semi upright view at 0515 hours. The endotracheal tube tip is just above the clavicles. Enteric tube courses to the abdomen, tip not  included. Stable left IJ approach central line. Multiple EKG wires are looped over the chest. Ventilation has not significantly changed since 12/30/2015. There is dense retrocardiac opacity obscuring the left hemidiaphragm. There is superimposed patchy perihilar and infrahilar opacity. No pneumothorax. Stable pulmonary vascularity. Small pleural effusions are possible. Stable postoperative changes about both shoulders. Cervical ACDF hardware again noted. IMPRESSION: 1.  Stable lines and tubes. 2. Stable ventilation since 12/30/2015 with bilateral lower lobe collapse or consolidation with superimposed patchy perihilar opacity and vascular congestion. Electronically Signed   By: Odessa Fleming M.D.   On: 01/02/2016 07:22   Dg Chest Port 1 View  01/01/2016  CLINICAL DATA:  ARDS. EXAM: PORTABLE CHEST 1 VIEW COMPARISON:  12/30/2015. FINDINGS: Endotracheal tube, feeding tube, left IJ line in stable position. Cardiomegaly with diffuse bilateral pulmonary alveolar infiltrates are again noted. Small bilateral pleural effusions. Similar findings noted on prior exam. No pneumothorax. Cervical spine fusion. Postsurgical changes both shoulders. IMPRESSION: 1. Lines and tubes in stable position. 2. Cardiomegaly with bilateral pulmonary infiltrates and pleural effusions consistent congestive heart failure. Similar findings noted on prior exam. Electronically Signed   By: Maisie Fus  Register   On: 01/01/2016 07:14   Dg Chest Port 1 View  12/30/2015  CLINICAL DATA:  ARDS EXAM: PORTABLE CHEST 1 VIEW COMPARISON:  December 29, 2015 FINDINGS: The ETT and left central line are in stable position. No pneumothorax. The pulmonary opacities are improved on the left but stable on the right. Stable cardiomegaly. No other interval changes. IMPRESSION: Improvement of pulmonary opacities on the left.  No other changes. Electronically Signed   By: Gerome Sam III M.D   On: 12/30/2015 07:25   Dg Chest Port 1 View  12/29/2015  CLINICAL DATA:   Respiratory failure. EXAM: PORTABLE CHEST 1 VIEW COMPARISON:  12/28/2015. FINDINGS: Endotracheal tube, feeding tube, left IJ line stable position. Cardiomegaly. Diffuse bilateral pulmonary infiltrates, slight interim clearing from prior exam. Bilateral pleural effusions again noted. No pneumothorax. Prior cervical spine fusion . Postsurgical changes both shoulders. IMPRESSION: 1. Lines and tubes in stable position. 2. Cardiomegaly with persistent bilateral pulmonary infiltrates/edema with slight interim clearing. Persistent bilateral pleural effusions. Electronically Signed   By: Maisie Fus  Register   On: 12/29/2015 07:19   Dg Chest Port 1 View  12/28/2015  CLINICAL DATA:  Respiratory failure and shortness of breath EXAM: PORTABLE CHEST 1 VIEW COMPARISON:  12/27/2015 FINDINGS: Endotracheal tube tip is at the clavicular heads. A feeding tube at least enters the stomach. Left IJ central line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

## 2016-01-22 NOTE — Progress Notes (Signed)
  Echocardiogram Echocardiogram Transesophageal has been performed.  Janalyn HarderWest, Damier Disano R 01/22/2016, 10:47 AM

## 2016-01-22 NOTE — Interval H&P Note (Signed)
History and Physical Interval Note:  01/22/2016 9:22 AM  [name]  has presented today for surgery, with the diagnosis of MS  The various methods of treatment have been discussed with the patient and family. After consideration of risks, benefits and other options for treatment, the patient has consented to  Procedure(s): TRANSESOPHAGEAL ECHOCARDIOGRAM (TEE) (N/A) as a surgical intervention .  The patient's history has been reviewed, patient examined, no change in status, stable for surgery.  I have reviewed the patient's chart and labs.  Questions were answered to the patient's satisfaction.     Tristen Pennino, Deloris PingPhilip J

## 2016-01-23 ENCOUNTER — Encounter (HOSPITAL_COMMUNITY): Payer: Self-pay | Admitting: Cardiovascular Disease

## 2016-01-23 LAB — BASIC METABOLIC PANEL
ANION GAP: 12 (ref 5–15)
ANION GAP: 11 (ref 5–15)
BUN: 11 mg/dL (ref 6–20)
BUN: 10 mg/dL (ref 6–20)
CALCIUM: 8.7 mg/dL — AB (ref 8.9–10.3)
CHLORIDE: 89 mmol/L — AB (ref 101–111)
CO2: 34 mmol/L — ABNORMAL HIGH (ref 22–32)
CO2: 35 mmol/L — ABNORMAL HIGH (ref 22–32)
Calcium: 8.7 mg/dL — ABNORMAL LOW (ref 8.9–10.3)
Chloride: 87 mmol/L — ABNORMAL LOW (ref 101–111)
Creatinine, Ser: 0.87 mg/dL (ref 0.44–1.00)
Creatinine, Ser: 0.84 mg/dL (ref 0.44–1.00)
Glucose, Bld: 360 mg/dL — ABNORMAL HIGH (ref 65–99)
Glucose, Bld: 472 mg/dL — ABNORMAL HIGH (ref 65–99)
POTASSIUM: 3.5 mmol/L (ref 3.5–5.1)
POTASSIUM: 3.5 mmol/L (ref 3.5–5.1)
SODIUM: 135 mmol/L (ref 135–145)
SODIUM: 133 mmol/L — AB (ref 135–145)

## 2016-01-23 LAB — GLUCOSE, CAPILLARY
GLUCOSE-CAPILLARY: 244 mg/dL — AB (ref 65–99)
GLUCOSE-CAPILLARY: 474 mg/dL — AB (ref 65–99)
GLUCOSE-CAPILLARY: 319 mg/dL — AB (ref 65–99)
Glucose-Capillary: 363 mg/dL — ABNORMAL HIGH (ref 65–99)
Glucose-Capillary: 291 mg/dL — ABNORMAL HIGH (ref 65–99)
Glucose-Capillary: 194 mg/dL — ABNORMAL HIGH (ref 65–99)

## 2016-01-23 LAB — MAGNESIUM: MAGNESIUM: 1.7 mg/dL (ref 1.7–2.4)

## 2016-01-23 MED ORDER — INSULIN GLARGINE 100 UNIT/ML ~~LOC~~ SOLN
25.0000 [IU] | Freq: Every day | SUBCUTANEOUS | Status: DC
  Administered 2016-01-24: 25 [IU] via SUBCUTANEOUS
  Filled 2016-01-23: qty 0.25

## 2016-01-23 MED ORDER — VALPROATE SODIUM 250 MG/5ML PO SOLN
500.0000 mg | Freq: Two times a day (BID) | ORAL | Status: DC
  Administered 2016-01-23 – 2016-01-25 (×6): 500 mg via ORAL
  Filled 2016-01-23 (×3): qty 10

## 2016-01-23 NOTE — Progress Notes (Signed)
PROGRESS NOTE                                                                                                                                                                                                             Patient Demographics:    [name], is a 55 y.o. female, DOB - 01-Jan-1961, ZOX:[id number]  Admit date - 01/11/2016   Admitting Physician Elizabeth Rocksavid J Merrell, MD  Outpatient Primary MD for the patient is Elizabeth GermanEdwin A Avbuere, MD  LOS - 12  No chief complaint on file.      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  Patient was recently admitted on 4/1 for AoCHF and COPDex. She had to be intubated, as she decompensated from respiratory standpoint as it was thought to be due to ARDS secondary to aspiration. She improved slowly and was transferred to to inpt rehab on 4/18.  She presented this time with acute respiratory failure secondary to acute diastolic congestive heart failure with last echo showing EF of 60% with grade 2 diastolic dysfunction. [name] on home lasix 40 mg BID , but was only taking 40 mg daily while on inpt rehab, so likely this contributed to the exacerbation. She was started on lasix, and on 4/21, rn was having difficulty keeping the patient's sats above 90 despite Bipap. Pt had to be emergently intubated, and PCCM was asked to take over.    Subjective:    [name] Pt has no new complaints. And I discussed her case and answered her questions to her satisfaction.  Blood sugars have been elevated.    Assessment  & Plan :    Principal Problem:   Acute on chronic respiratory failure (HCC) - secondary staph pneumonia. Improved after 2 days of zosyn and 5 of cefazolin, totaling 7 days of antibiotic  treatment.  Active Problems:   Diabetes mellitus, insulin dependent (IDDM), uncontrolled (HCC) - Not well controlled with numbers in the 400s. Will increase Lantus to 25 units subcutaneous daily. Once blood sugars consistently below 400s will consider discharge.  Hypokalemia - replace orally and reassess next am - most likely due to diarrhea    HTN (hypertension) -  stable on metoprolol    COPD (chronic obstructive pulmonary disease) (HCC) - stable no wheezes    Physical deconditioning  - PT evaluation recommending SNF    GERD without esophagitis - stable continue ppi    Bipolar I disorder (HCC) - stable    Acute on chronic diastolic CHF (congestive heart failure), NYHA class 1 (HCC) - currently on lasix. Will continue current regimen    Seizures (HCC) - stable on valproic acid  Hypomagnesemia - replace IV and reassess next am.  Diarrhea -PCCM placed on Flagyl. 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  Code Status : Full   Family Communication  : d/c patient directly  Disposition Plan  : pending improvement in respiratory condition and evaluation by PT  Barriers For Discharge : Pt will need evaluation of Rheumatic mitral valve stenosis  Consults  :  Cardiology  Procedures  :  SIGNIFICANT EVENTS: 4/26 extubated  LINES/TUBES: ETT 4/21>4/26  DVT Prophylaxis  :  Heparin   Lab Results  Component Value Date   PLT 576* 01/20/2016    Antibiotics  :  None  Anti-infectives    Start     Dose/Rate Route Frequency Ordered Stop   01/15/16 2200  ceFAZolin (ANCEF) IVPB 2g/100 mL premix     2 g 200 mL/hr over 30 Minutes Intravenous Every 8 hours 01/15/16 2126 01/19/16 2236   01/15/16 1015  ceFAZolin (ANCEF) IVPB 2g/100 mL premix     2 g 200 mL/hr over 30 Minutes Intravenous Every 8 hours  01/15/16 1000 01/15/16 1155   01/15/16 0945  metroNIDAZOLE (FLAGYL) IVPB 500 mg     500 mg 100 mL/hr over 60 Minutes Intravenous Every 8 hours 01/15/16 0944 01/25/16 2359   01/13/16 1600  piperacillin-tazobactam (ZOSYN) IVPB 3.375 g  Status:  Discontinued     3.375 g 12.5 mL/hr over 240 Minutes Intravenous Every 8 hours 01/13/16 1445 01/15/16 0941   01/13/16 1600  vancomycin (VANCOCIN) 1,250 mg in sodium chloride 0.9 % 250 mL IVPB  Status:  Discontinued     1,250 mg 166.7 mL/hr over 90 Minutes Intravenous Every 12 hours 01/13/16 1445 01/15/16 1425        Objective:   Filed Vitals:   01/22/16 1647 01/22/16 2101 01/23/16 0538 01/23/16 1255  BP: 112/58 113/46 139/62 120/53  Pulse: 75 74 62 82  Temp: 98.2 F (36.8 C) 98.1 F (36.7 C) 98.4 F (36.9 C) 98.2 F (36.8 C)  TempSrc:   Oral   Resp: 18 18 16 18   Weight:   84.5 kg (186 lb 4.6 oz)   SpO2: 97% 100% 99% 98%    Wt Readings from Last 3 Encounters:  01/23/16 84.5 kg (186 lb 4.6 oz)  01/11/16 101.9 kg (224 lb 10.4 oz)  01/09/16 103.42 kg (228 lb)     Intake/Output Summary (Last 24 hours) at 01/23/16 1507 Last data filed at 01/23/16 0600  Gross per 24 hour  Intake    120 ml  Output    650 ml  Net   -530 ml     Physical Exam  Awake Alert, in NAD Supple Neck, no goiter Symmetrical Chest wall movement, Good air movement bilaterally, no wheezes RRR,No Gallops,Rubs  +ve B.Sounds, Abd Soft, No tenderness, No organomegaly appriciated, No rebound - guarding or rigidity. No Clubbing  Data Review:    CBC  Recent Labs Lab 01/17/16 0230 01/20/16 1007  WBC 10.0 9.2  HGB 8.6* 9.4*  HCT 29.8* 32.1*  PLT 307 576*  MCV 81.4 80.3  MCH 23.5* 23.5*  MCHC 28.9* 29.3*  RDW 18.3* 17.6*    Chemistries   Recent Labs Lab 01/20/16 1007 01/21/16 1434 01/22/16 0556 01/23/16 0036 01/23/16 0608  NA 133* 138 138 135 133*  K 2.6* 2.7* 3.2* 3.5 3.5  CL 84* 89* 89* 89* 87*  CO2 34* 37* 35* 34* 35*  GLUCOSE 528* 161*  219* 360* 472*  BUN 16 11 9 10 11   CREATININE 0.92 0.63 0.65 0.87 0.84  CALCIUM 8.7* 8.9 8.8* 8.7* 8.7*  MG  --  1.6*  --   --  1.7   ------------------------------------------------------------------------------------------------------------------ No results for input(s): CHOL, HDL, LDLCALC, TRIG, CHOLHDL, LDLDIRECT in the last 72 hours.  Lab Results  Component Value Date   HGBA1C 7.5* 01/11/2016   ------------------------------------------------------------------------------------------------------------------ No results for input(s): TSH, T4TOTAL, T3FREE, THYROIDAB in the last 72 hours.  Invalid input(s): FREET3 ------------------------------------------------------------------------------------------------------------------ No results for input(s): VITAMINB12, FOLATE, FERRITIN, TIBC, IRON, RETICCTPCT in the last 72 hours.  Coagulation profile No results for input(s): INR, PROTIME in the last 168 hours.  No results for input(s): DDIMER in the last 72 hours.  Cardiac Enzymes No results for input(s): CKMB, TROPONINI, MYOGLOBIN in the last 168 hours.  Invalid input(s): CK ------------------------------------------------------------------------------------------------------------------    Component Value Date/Time   BNP 289.7* 01/14/2016 1002    Inpatient Medications  Scheduled Meds: . antiseptic oral rinse  7 mL Mouth Rinse q12n4p  . ARIPiprazole  5 mg Oral Daily  . aspirin  81 mg Oral Daily  . chlorhexidine  15 mL Mouth Rinse BID  . furosemide  40 mg Oral BID  . heparin  5,000 Units Subcutaneous Q8H  . insulin aspart  0-15 Units Subcutaneous TID WC  . [START ON 01/24/2016] insulin glargine  25 Units Subcutaneous Daily  . levothyroxine  125 mcg Oral QAC breakfast  . loperamide  2 mg Oral Once  . metoprolol  5 mg Intravenous Q6H  . metronidazole  500 mg Intravenous Q8H  . multivitamin with minerals  1 tablet Oral Daily  . pantoprazole  40 mg Oral Daily  . potassium  chloride  40 mEq Oral Once  . simvastatin  10 mg Oral q1800  . sodium chloride flush  3 mL Intravenous Q12H  . Valproate Sodium  500 mg Oral BID   Continuous Infusions:  PRN Meds:.sodium chloride, acetaminophen, ipratropium-albuterol, midazolam, RESOURCE THICKENUP CLEAR, sodium chloride flush  Micro Results Recent Results (from the past 240 hour(s))  Culture, blood (Routine X 2) w Reflex to ID Panel     Status: None   Collection Time: 01/13/16  6:00 PM  Result Value Ref Range Status   Specimen Description BLOOD LEFT ARM  Final   Special Requests BOTTLES DRAWN AEROBIC AND ANAEROBIC 5CC  Final   Culture NO GROWTH 5 DAYS  Final   Report Status 01/18/2016 FINAL  Final  Culture, blood (Routine X 2) w Reflex to ID Panel     Status: None   Collection Time: 01/13/16  6:10 PM  Result Value Ref Range Status   Specimen Description BLOOD LEFT HAND  Final   Special Requests IN PEDIATRIC BOTTLE 3CC  Final   Culture NO GROWTH 5 DAYS  Final   Report Status 01/18/2016 FINAL  Final  C difficile quick scan w PCR reflex  Status: Abnormal   Collection Time: 01/14/16  9:58 PM  Result Value Ref Range Status   C Diff antigen POSITIVE (A) NEGATIVE Final   C Diff toxin NEGATIVE NEGATIVE Final   C Diff interpretation   Final    C. difficile present, but toxin not detected. This indicates colonization. In most cases, this does not require treatment. If patient has signs and symptoms consistent with colitis, consider treatment. Requires ENTERIC precautions.  Clostridium Difficile by PCR     Status: None   Collection Time: 01/14/16  9:58 PM  Result Value Ref Range Status   Toxigenic C Difficile by pcr NEGATIVE NEGATIVE Final    Radiology Reports Dg Chest 2 View  01/08/2016  CLINICAL DATA:  Shortness of breath and weakness EXAM: CHEST  2 VIEW COMPARISON:  January 06, 2016 FINDINGS: There is interstitial edema with cardiomegaly and mild pulmonary venous hypertension. There is patchy atelectasis in both lower  lung zones. There is no airspace consolidation. No adenopathy. The patient is status post left total shoulder replacement as well as postoperative change in the proximal right humerus. There is postoperative change in the lower cervical region as well. IMPRESSION: Findings consistent with a degree of congestive heart failure. Areas of atelectasis in both lower lung zones. No airspace consolidation. Electronically Signed   By: Bretta Bang III M.D.   On: 01/08/2016 19:05   Dg Chest 2 View  01/06/2016  CLINICAL DATA:  55 year old female with a several hour history of dry cough and dyspnea. Medical history includes COPD and CHF. EXAM: CHEST  2 VIEW COMPARISON:  Prior chest x-ray 01/04/2016 FINDINGS: Left IJ approach central venous catheter remains in stable position with the tip projecting over the superior cavoatrial junction. The feeding tube is been removed. Stable cardiac and mediastinal contours. Improved pulmonary edema compared to prior imaging. There is still some persistent pulmonary vascular congestion. No new focal airspace consolidation, large effusion or evidence of pneumothorax. Incompletely imaged cervical thoracic stabilization hardware, left shoulder arthroplasty prosthesis and ORIF of a right proximal humerus fracture. IMPRESSION: 1. Improved pulmonary edema compared to 01/04/2016. There is mild residual pulmonary vascular congestion. 2. Stable cardiomegaly. 3. No new or acute cardiopulmonary process. Electronically Signed   By: Malachy Moan M.D.   On: 01/06/2016 10:29   Ct Head Wo Contrast  01/01/2016  CLINICAL DATA:  Acute encephalopathy. EXAM: CT HEAD WITHOUT CONTRAST TECHNIQUE: Contiguous axial images were obtained from the base of the skull through the vertex without intravenous contrast. COMPARISON:  CT scan of October 16, 2015. FINDINGS: Fluid is noted in the mastoid air cells bilaterally. Bony calvarium appears intact. Stable diffuse cortical atrophy is noted. Mild chronic  ischemic white matter disease is noted. No mass effect or midline shift is noted. Ventricular size is within normal limits. There is no evidence of mass lesion, hemorrhage or acute infarction. IMPRESSION: Stable diffuse cortical atrophy. Mild chronic ischemic white matter disease. No acute intracranial abnormality seen. Electronically Signed   By: Lupita Raider, M.D.   On: 01/01/2016 15:31   Ct Chest Wo Contrast  01/14/2016  CLINICAL DATA:  Acute respiratory distress requiring intubation 3 days ago. Worsening hypoxemia. EXAM: CT CHEST WITHOUT CONTRAST TECHNIQUE: Multidetector CT imaging of the chest was performed following the standard protocol without IV contrast. COMPARISON:  Radiographs 01/14/2016 and 01/13/2016.  CT 09/11/2013. FINDINGS: Mediastinum/Nodes: Small mediastinal and hilar lymph nodes bilaterally are similar to the prior CT. Hilar assessment is limited by the lack of intravenous contrastand perihilar pulmonary  opacities.There is no axillary adenopathy. Endotracheal tube, nasogastric tube and right IJ central venous catheter are in place. There is mild cardiomegaly. No significant pericardial fluid. Mild atherosclerosis is present. Lungs/Pleura: Small bilateral pleural effusions. There are patchy airspace opacities dependently in both lungs with mild volume loss in the lower lobes. There are patchy ground-glass opacities throughout the aerated lungs. No endobronchial lesion or suspicious pulmonary nodule. Upper abdomen: The visualized upper abdomen demonstrates no suspicious findings. Musculoskeletal/Chest wall: There is mild generalized edema within the chest wall. No acute osseous findings are seen. There is stable chronic sternal deformity. There are postsurgical changes in the lower cervical spine and both proximal humeri. IMPRESSION: 1. Dependent airspace opacities in both lungs with associated volume loss, similar to recent radiographs. Although likely in part secondary to atelectasis,  distribution is suspicious for infection, possibly on the basis of aspiration. 2. Ground-glass opacities in the aerated lungs consistent with mild edema. 3. Small bilateral pleural effusions. Electronically Signed   By: Carey Bullocks M.D.   On: 01/14/2016 17:14   Dg Chest Port 1 View  01/18/2016  CLINICAL DATA:  Acute respiratory failure with hypoxia. Shortness of breath today. EXAM: PORTABLE CHEST 1 VIEW COMPARISON:  01/17/2016 and 01/16/2016 FINDINGS: 0717 hours. Interval extubation and removal of the nasogastric tube. There is stable cardiomegaly. Pulmonary edema has improved, although there is an asymmetric component in the right upper lobe which appears slightly worse. No pneumothorax or significant pleural effusion is seen. Old rib fractures are present on the left. There are postsurgical changes in the cervical spine and both humeri. IMPRESSION: Overall improvement in pulmonary edema following extubation. There is asymmetric airspace disease in the right upper lobe which has mildly progressed. This may reflect asymmetric edema or developing infiltrate. Electronically Signed   By: Carey Bullocks M.D.   On: 01/18/2016 07:33   Dg Chest Port 1 View  01/17/2016  CLINICAL DATA:  CHF. EXAM: PORTABLE CHEST 1 VIEW COMPARISON:  01/16/2016. FINDINGS: Interim removal right IJ line. Endotracheal tube, NG tube in stable position . Cardiomegaly. Persistent but improving bilateral patchy pulmonary infiltrates and/or edema. No pleural effusion or pneumothorax. Prior cervical spine fusion. Postsurgical changes both shoulders. IMPRESSION: 1. Stable cardiomegaly. 2. Persistent but slightly improving patchy bilateral pulmonary infiltrates and/or pulmonary edema. Electronically Signed   By: Maisie Fus  Register   On: 01/17/2016 07:23   Dg Chest Port 1 View  01/16/2016  CLINICAL DATA:  Pneumonia EXAM: PORTABLE CHEST 1 VIEW COMPARISON:  January 15, 2016 FINDINGS: The heart size and mediastinal contours are stable. The heart  size is enlarged. Endotracheal tube is identified distal tip 5 cm from carina. Right central venous line is noted distal tip in the superior vena cava. Nasogastric tube is identified distal tip is not included on film but is at least in the stomach. There is persistent pulmonary vascular congestion with enlargement of perihilar central pulmonary vessels unchanged. Mild hazy opacity of bilateral lung bases are identified unchanged. There is no pleural effusion. The visualized skeletal structures are stable. IMPRESSION: Mild central pulmonary vascular congestion unchanged compared to prior exam. Mild hazy airspace disease in bilateral lung bases not significantly changed compared to prior exam. Electronically Signed   By: Sherian Rein M.D.   On: 01/16/2016 07:22   Dg Chest Port 1 View  01/15/2016  CLINICAL DATA:  Respiratory failure EXAM: PORTABLE CHEST 1 VIEW COMPARISON:  01/14/2016 FINDINGS: Central line endotracheal tube and orogastric tube appear unchanged. Mild cardiac silhouette enlargement stable. There is vascular  congestion with indistinct perihilar vasculature bilaterally. Hazy density at both lung bases again identified. IMPRESSION: Bilateral airspace disease is mildly improved when compared to prior study. Electronically Signed   By: Esperanza Heir M.D.   On: 01/15/2016 07:19   Dg Chest Port 1 View  01/14/2016  CLINICAL DATA:  Acute respiratory failure. On ventilator. Congestive heart failure. COPD. EXAM: PORTABLE CHEST 1 VIEW COMPARISON:  01/13/2016 FINDINGS: Patient is rotated to the right. Endotracheal tube, nasogastric tube, and right jugular central venous catheter remain in appropriate position. Cardiomegaly stable. Symmetric bilateral airspace disease with bibasilar predominance shows no significant change, most likely due to pulmonary edema or ARDS. No pneumothorax visualized. No definite pleural effusion noted on this portable exam. IMPRESSION: Stable cardiomegaly and symmetric bilateral  airspace disease, most likely due to pulmonary edema or ARDS. Electronically Signed   By: Myles Rosenthal M.D.   On: 01/14/2016 08:32   Dg Chest Port 1 View  01/12/2016  CLINICAL DATA:  Encounter for central line placement EXAM: PORTABLE CHEST 1 VIEW COMPARISON:  Portable exam 1628 hours compared to 01/12/2016 FINDINGS: Tip of endotracheal tube projects 5.6 cm above carina. Nasogastric tube extends into stomach. New RIGHT jugular central venous catheter with tip projecting over SVC. Enlargement of cardiac silhouette with pulmonary vascular congestion. BILATERAL pulmonary infiltrates question pulmonary edema. Suspect small bibasilar effusions. No pneumothorax. Prior cervical spine fusion. IMPRESSION: No pneumothorax following RIGHT jugular line placement. Enlargement of cardiac silhouette with vascular congestion and suspected BILATERAL pulmonary edema. Electronically Signed   By: Ulyses Southward M.D.   On: 01/12/2016 16:50   Dg Chest Port 1 View  01/12/2016  CLINICAL DATA:  Respiratory distress, recent intubation EXAM: PORTABLE CHEST 1 VIEW COMPARISON:  01/12/2016 FINDINGS: Cardiac shadow is stable. Bibasilar densities are again seen and stable. Mild vascular congestion is again noted. A nasogastric catheter is now noted within the stomach. An endotracheal tube is seen approximately 3.8 cm above the carina. Postsurgical changes are noted in both shoulders and lower cervical spine. No acute bony abnormality is seen. IMPRESSION: Stable appearance of the chest. The endotracheal tube and nasogastric catheter are in satisfactory position. Electronically Signed   By: Alcide Clever M.D.   On: 01/12/2016 10:43   Dg Chest Port 1 View  01/12/2016  CLINICAL DATA:  Acute renal failure ; congestive heart failure EXAM: PORTABLE CHEST 1 VIEW COMPARISON:  January 11, 2016 FINDINGS: There is interstitial edema throughout the lungs with patchy alveolar edema in the bases. There is cardiomegaly with pulmonary venous hypertension. There  is a equivocal small pleural effusions bilaterally. There is postoperative change in the proximal right humerus. There is a total shoulder replacement on the left. There is postoperative change in the lower cervical spine. IMPRESSION: Congestive heart failure persists. Superimposed pneumonia in the bases cannot be excluded radiographically. Appearance is quite similar to 1 day prior. Electronically Signed   By: Bretta Bang III M.D.   On: 01/12/2016 07:29   Dg Chest Port 1 View  01/11/2016  CLINICAL DATA:  Shortness of breath today.  Initial encounter. EXAM: PORTABLE CHEST 1 VIEW COMPARISON:  PA and lateral chest 01/08/2016 and 01/06/2016. FINDINGS: Cardiomegaly and pulmonary edema are seen. There are likely bilateral pleural effusions. Aeration is markedly worsened compared to the prior study. No pneumothorax is identified. Left shoulder replacement is noted. The patient is status post fixation of a proximal humerus fracture and cervical fusion. IMPRESSION: Marked worsening in aeration most consistent with congestive heart failure. Electronically Signed  By: Drusilla Kanner M.D.   On: 01/11/2016 15:11   Dg Chest Port 1 View  01/04/2016  CLINICAL DATA:  Respiratory failure. EXAM: PORTABLE CHEST 1 VIEW COMPARISON:  01/02/2016 . FINDINGS: Feeding tube and and left IJ line in stable position. Cardiomegaly with persistent diffuse bilateral pulmonary interstitial changes consistent congestive heart failure again noted. Basilar atelectasis and/or infiltrates again noted. Small bilateral pleural effusions. No pneumothorax. Prior cervical spine fusion. Postsurgical changes both shoulders again noted . IMPRESSION: 1. Lines and tubes in stable position. 2. Persistent changes of congestive heart failure with bilateral pulmonary interstitial edema. Basilar atelectasis and/or infiltrates again noted . Small bilateral effusions again noted. Electronically Signed   By: Maisie Fus  Register   On: 01/04/2016 07:15   Dg  Chest Port 1 View  01/02/2016  CLINICAL DATA:  55 year old female with encephalopathy. Shortness of breath. Respiratory failure. ARDS. Initial encounter. EXAM: PORTABLE CHEST 1 VIEW COMPARISON:  01/01/2016 and earlier. FINDINGS: Portable AP semi upright view at 0515 hours. The endotracheal tube tip is just above the clavicles. Enteric tube courses to the abdomen, tip not included. Stable left IJ approach central line. Multiple EKG wires are looped over the chest. Ventilation has not significantly changed since 12/30/2015. There is dense retrocardiac opacity obscuring the left hemidiaphragm. There is superimposed patchy perihilar and infrahilar opacity. No pneumothorax. Stable pulmonary vascularity. Small pleural effusions are possible. Stable postoperative changes about both shoulders. Cervical ACDF hardware again noted. IMPRESSION: 1.  Stable lines and tubes. 2. Stable ventilation since 12/30/2015 with bilateral lower lobe collapse or consolidation with superimposed patchy perihilar opacity and vascular congestion. Electronically Signed   By: Odessa Fleming M.D.   On: 01/02/2016 07:22   Dg Chest Port 1 View  01/01/2016  CLINICAL DATA:  ARDS. EXAM: PORTABLE CHEST 1 VIEW COMPARISON:  12/30/2015. FINDINGS: Endotracheal tube, feeding tube, left IJ line in stable position. Cardiomegaly with diffuse bilateral pulmonary alveolar infiltrates are again noted. Small bilateral pleural effusions. Similar findings noted on prior exam. No pneumothorax. Cervical spine fusion. Postsurgical changes both shoulders. IMPRESSION: 1. Lines and tubes in stable position. 2. Cardiomegaly with bilateral pulmonary infiltrates and pleural effusions consistent congestive heart failure. Similar findings noted on prior exam. Electronically Signed   By: Maisie Fus  Register   On: 01/01/2016 07:14   Dg Chest Port 1 View  12/30/2015  CLINICAL DATA:  ARDS EXAM: PORTABLE CHEST 1 VIEW COMPARISON:  December 29, 2015 FINDINGS: The ETT and left central line are  in stable position. No pneumothorax. The pulmonary opacities are improved on the left but stable on the right. Stable cardiomegaly. No other interval changes. IMPRESSION: Improvement of pulmonary opacities on the left.  No other changes. Electronically Signed   By: Gerome Sam III M.D   On: 12/30/2015 07:25   Dg Chest Port 1 View  12/29/2015  CLINICAL DATA:  Respiratory failure. EXAM: PORTABLE CHEST 1 VIEW COMPARISON:  12/28/2015. FINDINGS: Endotracheal tube, feeding tube, left IJ line stable position. Cardiomegaly. Diffuse bilateral pulmonary infiltrates, slight interim clearing from prior exam. Bilateral pleural effusions again noted. No pneumothorax. Prior cervical spine fusion . Postsurgical changes both shoulders. IMPRESSION: 1. Lines and tubes in stable position. 2. Cardiomegaly with persistent bilateral pulmonary infiltrates/edema with slight interim clearing. Persistent bilateral pleural effusions.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

## 2016-01-23 NOTE — Progress Notes (Addendum)
CSW spoke with pt and pt husband at bedside.  Pt was very lethargic during interview after receiving anxiety meds.  Pt husband had wanted to talk about SNF but states that they can not afford for pt check to be taken.  CSW explained that with Medicaid as the only payer source money would be taken from the disability check and pt would be left with a $30/month stipend.  CSW informed that we could send letter to DSS explaining situation and that they would possibly allow some of the check to be used for rent but that this is not a guarentee.  Pt husband stated they could not take a risk and that they use $1,200/month of $1,400 in benefits for housing- pt husband states that if any money is taken from that check then they will lose their home.  Husband states that he has been taking care of pt at home and is there 24/hr a day- states they have an aid 7days/wk and they have lots of equipment- did inquire about a new wheelchair- CSW informed RNCM of decision to return home  No further needs identified- CSW signing off- please reconsult if needed  Merlyn LotJenna Holoman, The Rehabilitation Hospital Of Southwest VirginiaCSWA Clinical Social Worker [phone]

## 2016-01-23 NOTE — Care Management Note (Signed)
Case Management Note  Patient Details  Name: [name] MRN: [id number] Date of Birth: [birth year]  Subjective/Objective:                 Spoke with patient in the room. Patient states that she is afraid she will not walk again. She states that is want to go to SNF as long as it doesn't take all her money or she has to stay forever. She originally thought she could only go to CIR or home w/ HH. CM explained the difference between custodial care and skilled care. Updated BelgiumJenna CSW of patient's interest in SNF and that husband will be in around 12:30. Patient states that she has walker, wc, and BSC at home. Lives with husband and children.    Action/Plan:  Anticipate DC to SNF when medically stable.  Expected Discharge Date:                  Expected Discharge Plan:  Skilled Nursing Facility  In-House Referral:  Clinical Social Work  Discharge planning Services  CM Consult  Post Acute Care Choice:  NA Choice offered to:     DME Arranged:    DME Agency:     HH Arranged:    HH Agency:     Status of Service:  In process, will continue to follow  Medicare Important Message Given:    Date Medicare IM Given:    Medicare IM give by:    Date Additional Medicare IM Given:    Additional Medicare Important Message give by:     If discussed at Long Length of Stay Meetings, dates discussed:    Additional Comments:  Lawerance SabalDebbie Tejasvi Brissett, RN 01/23/2016, 12:32 PM

## 2016-01-23 NOTE — Progress Notes (Signed)
RN took pt's CBG resulted in 474. Sliding scale covers to 400. MD text paged. MD called back informed RN to give 15 units of insulin (highest amount on sliding scale) and recheck CBG in 2 hours. [name] 01/23/2016 8:08 AM

## 2016-01-23 NOTE — Progress Notes (Signed)
After TEE pt was put on a regular diet with thin liquids. She has been on Dys 2/honey thick liquids due to severe dysphagia with silent aspiration, found on MBS on 4/28. Will resume diet and f/u at bedside later today for check of tolerance and consideration for upgrade. Harlon DittyBonnie Markel Kurtenbach, MA CCC-SLP [phone]

## 2016-01-23 NOTE — Progress Notes (Signed)
Inpatient Diabetes Program Recommendations  AACE/ADA: New Consensus Statement on Inpatient Glycemic Control (2015)  Target Ranges:  Prepandial:   less than 140 mg/dL      Peak postprandial:   less than 180 mg/dL (1-2 hours)      Critically ill patients:  140 - 180 mg/dL   Review of Glycemic Control Results for [name], [name] (MRN [id number]) as of 01/23/2016 09:00  Ref. Range 01/22/2016 08:23 01/22/2016 12:17 01/22/2016 17:40 01/22/2016 21:36 01/23/2016 07:51  Glucose-Capillary Latest Ref Range: 65-99 mg/dL 409279 (H) 811193 (H) 914233 (H) 244 (H) 474 (H)   Home DM Meds: Lantus 10 units daily  Novolog 6-15 units tidwc  Current Insulin Orders: Lantus 22 units daily  Novolog Moderate Correction Scale/ SSI (0-15 units) TID AC      MD- Note patient continues to have elevated fasting glucose levels. Note did not receive evening Novolog correction and CBGs continued to elevate through the hs.  Please consider further increase of Lantus to 25 units daily (approximately 0.3 units/kg dosing) and meal coverage Novolog 5 units tid with meals + Novolog correction 0-5 units hs.  Thank you, Billy FischerJudy E. Tina Gruner, RN, MSN, CDE Inpatient Glycemic Control Team Team Pager [phone] (8am-5pm) 01/23/2016 9:08 AM

## 2016-01-24 ENCOUNTER — Inpatient Hospital Stay (HOSPITAL_COMMUNITY): Payer: Medicaid Other

## 2016-01-24 DIAGNOSIS — Z794 Long term (current) use of insulin: Secondary | ICD-10-CM

## 2016-01-24 DIAGNOSIS — E1165 Type 2 diabetes mellitus with hyperglycemia: Secondary | ICD-10-CM

## 2016-01-24 DIAGNOSIS — K219 Gastro-esophageal reflux disease without esophagitis: Secondary | ICD-10-CM

## 2016-01-24 LAB — GLUCOSE, CAPILLARY
GLUCOSE-CAPILLARY: 403 mg/dL — AB (ref 65–99)
GLUCOSE-CAPILLARY: 298 mg/dL — AB (ref 65–99)
GLUCOSE-CAPILLARY: 243 mg/dL — AB (ref 65–99)
GLUCOSE-CAPILLARY: 160 mg/dL — AB (ref 65–99)
GLUCOSE-CAPILLARY: 170 mg/dL — AB (ref 65–99)
GLUCOSE-CAPILLARY: 177 mg/dL — AB (ref 65–99)
Glucose-Capillary: 129 mg/dL — ABNORMAL HIGH (ref 65–99)
Glucose-Capillary: 159 mg/dL — ABNORMAL HIGH (ref 65–99)
Glucose-Capillary: 163 mg/dL — ABNORMAL HIGH (ref 65–99)
Glucose-Capillary: 298 mg/dL — ABNORMAL HIGH (ref 65–99)
Glucose-Capillary: 553 mg/dL (ref 65–99)

## 2016-01-24 MED ORDER — SODIUM CHLORIDE 0.9 % IV SOLN
INTRAVENOUS | Status: DC
  Administered 2016-01-24: 13:00:00 via INTRAVENOUS

## 2016-01-24 MED ORDER — METOPROLOL TARTRATE 12.5 MG HALF TABLET: 12.5000 mg | ORAL_TABLET | Freq: Two times a day (BID) | ORAL | Status: DC

## 2016-01-24 MED ORDER — INSULIN ASPART 100 UNIT/ML ~~LOC~~ SOLN
0.0000 [IU] | Freq: Three times a day (TID) | SUBCUTANEOUS | Status: DC
  Administered 2016-01-25: 11 [IU] via SUBCUTANEOUS
  Administered 2016-01-25: 15 [IU] via SUBCUTANEOUS
  Administered 2016-01-25: 3 [IU] via SUBCUTANEOUS
  Administered 2016-01-26: 11 [IU] via SUBCUTANEOUS

## 2016-01-24 MED ORDER — DEXTROSE 50 % IV SOLN: 25.0000 mL | INTRAVENOUS | Status: DC | PRN

## 2016-01-24 MED ORDER — GLUCERNA SHAKE PO LIQD
237.0000 mL | Freq: Two times a day (BID) | ORAL | Status: DC
  Administered 2016-01-25 (×2): 237 mL via ORAL

## 2016-01-24 MED ORDER — INSULIN REGULAR HUMAN 100 UNIT/ML IJ SOLN
INTRAMUSCULAR | Status: DC
  Administered 2016-01-24: 4.9 [IU]/h via INTRAVENOUS
  Filled 2016-01-24: qty 2.5

## 2016-01-24 MED ORDER — METOPROLOL TARTRATE 12.5 MG HALF TABLET
12.5000 mg | ORAL_TABLET | Freq: Two times a day (BID) | ORAL | Status: DC
  Administered 2016-01-25 – 2016-01-26 (×4): 12.5 mg via ORAL
  Filled 2016-01-24 (×4): qty 1

## 2016-01-24 MED ORDER — DEXTROSE-NACL 5-0.45 % IV SOLN
INTRAVENOUS | Status: DC
  Administered 2016-01-24: 20:00:00 via INTRAVENOUS

## 2016-01-24 MED ORDER — INSULIN REGULAR BOLUS VIA INFUSION
0.0000 [IU] | Freq: Three times a day (TID) | INTRAVENOUS | Status: DC
  Filled 2016-01-24: qty 10

## 2016-01-24 MED ORDER — INSULIN ASPART 100 UNIT/ML ~~LOC~~ SOLN
0.0000 [IU] | Freq: Every day | SUBCUTANEOUS | Status: DC
  Administered 2016-01-25: 3 [IU] via SUBCUTANEOUS

## 2016-01-24 MED ORDER — INSULIN GLARGINE 100 UNIT/ML ~~LOC~~ SOLN
30.0000 [IU] | Freq: Every day | SUBCUTANEOUS | Status: DC
  Administered 2016-01-24: 30 [IU] via SUBCUTANEOUS
  Filled 2016-01-24 (×3): qty 0.3
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
## 2016-01-24 NOTE — Progress Notes (Signed)
Physical Therapy Treatment Patient Details Name: [name] MRN: [id number] DOB: April 30, 1961 Today's Date: 01/24/2016    History of Present Illness This is a 55 yo F with COPD, CHF, diabetes, mitral stenosis secondary to rheumatic fever history, diabetes, bipolar, GERD, seizures, left shoulder fracture, who came in with acute restorative failure and acute encephalopathy. Patient was recently admitted on 4/1 for AoCHF and COPDex. She had to be intubated, as she decompensated from respiratory standpoint as it was thought to be due to ARDS secondary to aspiration. She improved slowly and was transferred to to inpt rehab on 4/18. She presented this time with acute respiratory failure secondary to acute diastolic congestive heart failure with last echo showing EF of 60% with grade 2 diastolic dysfunction. [name] on home lasix 40 mg BID , but was only taking 40 mg daily while on inpt rehab, so likely this contributed to the exacerbation. She was started on lasix, and on 4/21, rn was having difficulty keeping the patient's sats above 90 despite Bipap. Pt had to be emergently intubated, and PCCM was asked to take over.     PT Comments    Patient continues to need Min/Mod A for transfers and despite encouragement declined further ambulation than 4-5 steps to chair this session. Led through therex and encouraged pt to work on exercises throughout the day. Continue to progress as tolerated.   Follow Up Recommendations  SNF;Supervision/Assistance - 24 hour     Equipment Recommendations  None recommended by PT    Recommendations for Other Services OT consult     Precautions / Restrictions Precautions Precautions: Fall Precaution Comments: 2Lo2 via [place], increased anxiety Other Brace/Splint: has diabetic shoes Restrictions Weight Bearing Restrictions: No    Mobility  Bed Mobility Overal bed mobility: Needs Assistance Bed Mobility: Supine to Sit     Supine to sit: Min guard;HOB elevated      General bed mobility comments: min guard for safety and increased time needed; use of bedrail  Transfers Overall transfer level: Needs assistance Equipment used: Rolling walker (2 wheeled) Transfers: Sit to/from Stand Sit to Stand: Mod assist;+2 safety/equipment Stand pivot transfers: Min assist;+2 physical assistance;+2 safety/equipment       General transfer comment: assist to power up into standing; verbal and tactile cues for posture and guidance of RW; trunk flexed throughout; pt able to take 4-5 "shuffling" steps forward and then wanted to sit down despite encouragement to attempt futher ambulation   Ambulation/Gait                 Stairs            Wheelchair Mobility    Modified Rankin (Stroke Patients Only)       Balance     Sitting balance-Leahy Scale: Fair       Standing balance-Leahy Scale: Poor                      Cognition Arousal/Alertness: Awake/alert Behavior During Therapy: Flat affect Overall Cognitive Status: No family/caregiver present to determine baseline cognitive functioning                      Exercises General Exercises - Lower Extremity Ankle Circles/Pumps: AROM;Both;10 reps;Supine Long Arc Quad: AROM;Both;15 reps;Seated Hip ABduction/ADduction: AROM;Both;10 reps;Seated Hip Flexion/Marching: AROM;Both;15 reps;Seated    General Comments General comments (skin integrity, edema, etc.): encouraged pt to work on HEP throughout the day      Pertinent Vitals/Pain Pain Assessment: No/denies  pain    Home Living                      Prior Function            PT Goals (current goals can now be found in the care plan section) Acute Rehab PT Goals Patient Stated Goal: wants to go home PT Goal Formulation: With patient Time For Goal Achievement: 02/03/16 Potential to Achieve Goals: Good Progress towards PT goals: Progressing toward goals    Frequency  Min 3X/week    PT Plan Current plan  remains appropriate    Co-evaluation             End of Session Equipment Utilized During Treatment: Gait belt;Oxygen Activity Tolerance: Patient tolerated treatment well Patient left: in chair;with call bell/phone within reach;with chair alarm set     Time: [id number] PT Time Calculation (min) (ACUTE ONLY): 24 min  Charges:  $Therapeutic Exercise: 8-22 mins $Therapeutic Activity: 8-22 mins                    G Codes:      Derek MoundKellyn R Marcena Dias Sanad Fearnow, PTA Pager: [phone]   01/24/2016, 11:23 AM

## 2016-01-24 NOTE — Progress Notes (Signed)
Speech Language Pathology Treatment: Dysphagia  Patient Details Name: [name] MRN: [id number] DOB: Sep 11, 1961 Today's Date: 01/24/2016 Time: 0900-0908 SLP Time Calculation (min) (ACUTE ONLY): 8 min  Assessment / Plan / Recommendation Clinical Impression  Pt seen at bedside finishing am meal. She has consumed honey thick liquids, but also has thin liquids at bedside. SLp provided reminder of finding of silent aspiration on prior MBS. Pts vocal quality has improved, indicating possibility of improved glottal closure during the swallow and improved sensation. No signs of aspiration are observed with trials of thin, but objective test is needed prior to diet upgrade. Given possibility of d/c home at any time, repeat MBS needed ASAP. Will attempt in pm if possible.    HPI HPI: 55 year old woman admitted on 03/31 with acute HF exacerbation 2/2 non-compliance with diuretic therapy. The patient was doing fairly well with diuresis until 12/25/15 when she developed significant respiratory distress. Her O2 sats were in the 70s on 100% NRB and [place]. She was intolerant of BiPAP and the critical care team was subsequently consulted for intubation.  She vomited large volume of emesis during intubation and subsequently had what appeared to be significant aspiration event. Pt intubated from 4/3-4/12. Pt was seen for BSE on 4/13 and made NPO due to signs of acute reversible dysphagia following intubation. On 4/14 pt showed improvement and was started on dys 3/thin diet and SLP signed off. Then on 4/16 pt observed to have difficutly with meal, coughing and gurgling. SLP reordered. CXR on 4/15 shows no new acute process. Restarted on diet, went to CIR, CHF exacerbation, became poorly responsive, reintubated for 4/21 to 4/26.       SLP Plan  MBS     Recommendations  Diet recommendations: Dysphagia 3 (mechanical soft);Honey-thick liquid Liquids provided via: Cup;Straw Medication Administration: Crushed  with puree Supervision: Full supervision/cueing for compensatory strategies Compensations: Minimize environmental distractions;Slow rate;Small sips/bites;Follow solids with liquid Postural Changes and/or Swallow Maneuvers: Seated upright 90 degrees;Upright 30-60 min after meal             Oral Care Recommendations: Oral care BID Follow up Recommendations: Home health SLP Plan: MBS     GO               Harlon DittyBonnie Xoie Kreuser, MA CCC-SLP [phone]  Claudine MoutonDeBlois, Castella Lerner Caroline 01/24/2016, 9:57 AM

## 2016-01-24 NOTE — Progress Notes (Signed)
Pt CBG was [phone], MD text paged and MD called back with orders to give lantus and max dose of sliding scale, place order for diabetic consult stat. RN placed order and will administer inulins per MD order.  Tobin Chadracy Amanat Hackel 01/24/2016 08:47AM

## 2016-01-24 NOTE — Progress Notes (Signed)
Nutrition Follow-up  DOCUMENTATION CODES:   Morbid obesity  INTERVENTION:   -Glucerna Shake po BID, each supplement provides 220 kcal and 10 grams of protein  NUTRITION DIAGNOSIS:   Increased nutrient needs related to wound healing as evidenced by estimated needs.  Ongoing  GOAL:   Patient will meet greater than or equal to 90% of their needs  Unmet  MONITOR:   PO intake, Supplement acceptance, Diet advancement, Labs, Weight trends, Skin, I & O's  REASON FOR ASSESSMENT:   Consult, Ventilator Enteral/tube feeding initiation and management  ASSESSMENT:   55 yo woman with history of CHF and COPD who came in with respiratory distress and was emergently intubated on 4/21 due to difficulty maintaining O2 sats above 90 while on biPap.  Pt in with MD at time of visit.   Case discussed with RN and nurse tech. Both confirm that intake has been poor (PO: 15-25%) and pt does not like thickened liquids. Pt underwent MBSS later today and was advanced to a dysphagia 3 diet with thin liquids. RD anticipates intake will improve due to diet advancement.   Cardiology following due to severe mitral valve stenosis; underwent TEE on 01/22/16.   Pt is refusing SNF placement. Per discussion with RN, working with pt to determine most appropriate discharge disposition.   Labs reviewed: CBGS: [phone]. MD has ordered glucostabilizer to help with glycemic control.   Diet Order:  DIET DYS 3 Room service appropriate?: Yes; Fluid consistency:: Thin  Skin:  Wound (see comment) (stage II sacrum, MASD buttocks)  Last BM:  01/22/16  Height:   Ht Readings from Last 1 Encounters:  01/09/16 4\' 9"  (1.448 m)    Weight:   Wt Readings from Last 1 Encounters:  01/24/16 181 lb 3.5 oz (82.2 kg)    Ideal Body Weight:  43.8 kg  BMI:  Body mass index is 39.2 kg/(m^2).  Estimated Nutritional Needs:   Kcal:  1850-2050  Protein:  100-115 grams  Fluid:  >1.5 L  EDUCATION NEEDS:   No education  needs identified at this time  Hiep Ollis A. Mayford KnifeWilliams, RD, LDN, CDE Pager: [phone] After hours Pager: [phone]

## 2016-01-24 NOTE — Care Management Important Message (Signed)
Important Message  Patient Details  Name: [name] MRN: [id number] Date of Birth: [birth year]   Medicare Important Message Given:  Yes    Lawerance Sabalebbie Tito Ausmus, RN 01/24/2016, 8:27 AMImportant Message  Patient Details  Name: Elizabeth BuryMary J [name] MRN: [id number] Date of Birth: [birth year]   Medicare Important Message Given:  Yes    Lawerance Sabalebbie Engelbert Sevin, RN 01/24/2016, 8:26 AM

## 2016-01-24 NOTE — Progress Notes (Signed)
Pt's CBG for lunch was [phone] per CNA tech. RN paged MD regarding pt's current situation. MD called back stated that he will place pt on insulin gtt. RN waiting for orders at this time. [name] 01/24/2016 12:08 PM

## 2016-01-24 NOTE — Progress Notes (Signed)
MBSS complete. Full report located under chart review in imaging section.  Mihcael Ledee Paiewonsky, M.A. CCC-SLP [phone]  

## 2016-01-24 NOTE — Progress Notes (Signed)
Inpatient Diabetes Program Recommendations  AACE/ADA: New Consensus Statement on Inpatient Glycemic Control (2015)  Target Ranges:  Prepandial:   less than 140 mg/dL      Peak postprandial:   less than 180 mg/dL (1-2 hours)      Critically ill patients:  140 - 180 mg/dL  Results for Evaristo BuryMCBRIDE, Corazon J (MRN [id number]) as of 01/24/2016 12:06  Ref. Range 01/23/2016 07:51 01/23/2016 10:54 01/23/2016 12:53 01/23/2016 16:52 01/23/2016 21:44 01/24/2016 07:43  Glucose-Capillary Latest Ref Range: 65-99 mg/dL 284474 (H) 132363 (H) 440291 (H) 194 (H) 319 (H) 403 (H)   Review of Glycemic Control  Outpatient Diabetes medications: Lantus 10 units daily, Novolog 6-15 units TID with meals Current orders for Inpatient glycemic control: Insulin drip per IV insulin/GlucoStabilizer order set  Note order to start on IV insulin/GlucoStabilizer order set. Will follow while inpatient.  Thanks, Orlando PennerMarie Mellina Benison, RN, MSN, CDE Diabetes Coordinator Inpatient Diabetes Program [phone] (Team Pager from 8am to 5pm) [phone] ([facility] office) [phone] [facility] office) [phone] [facility] office)

## 2016-01-25 LAB — BASIC METABOLIC PANEL
ANION GAP: 13 (ref 5–15)
BUN: 7 mg/dL (ref 6–20)
CALCIUM: 9 mg/dL (ref 8.9–10.3)
CHLORIDE: 91 mmol/L — AB (ref 101–111)
CO2: 34 mmol/L — AB (ref 22–32)
Creatinine, Ser: 0.59 mg/dL (ref 0.44–1.00)
GFR calc Af Amer: >60 mL/min (ref >60)
GFR calc non Af Amer: >60 mL/min (ref >60)
GLUCOSE: 131 mg/dL — AB (ref 65–99)
POTASSIUM: 3.4 mmol/L — AB (ref 3.5–5.1)
Sodium: 138 mmol/L (ref 135–145)

## 2016-01-25 LAB — GLUCOSE, CAPILLARY
GLUCOSE-CAPILLARY: 173 mg/dL — AB (ref 65–99)
GLUCOSE-CAPILLARY: 310 mg/dL — AB (ref 65–99)
GLUCOSE-CAPILLARY: 367 mg/dL — AB (ref 65–99)
Glucose-Capillary: 168 mg/dL — ABNORMAL HIGH (ref 65–99)
Glucose-Capillary: 252 mg/dL — ABNORMAL HIGH (ref 65–99)
Glucose-Capillary: 510 mg/dL — ABNORMAL HIGH (ref 65–99)

## 2016-01-25 LAB — CBC
HEMATOCRIT: 31.3 % — AB (ref 36.0–46.0)
Hemoglobin: 9.5 g/dL — ABNORMAL LOW (ref 12.0–15.0)
MCH: 23.9 pg — ABNORMAL LOW (ref 26.0–34.0)
MCHC: 30.4 g/dL (ref 30.0–36.0)
MCV: 78.8 fL (ref 78.0–100.0)
Platelets: 395 10*3/uL (ref 150–400)
RBC: 3.97 MIL/uL (ref 3.87–5.11)
RDW: 19 % — AB (ref 11.5–15.5)
WBC: 11.1 10*3/uL — AB (ref 4.0–10.5)

## 2016-01-25 MED ORDER — INSULIN ASPART 100 UNIT/ML ~~LOC~~ SOLN
5.0000 [IU] | Freq: Three times a day (TID) | SUBCUTANEOUS | Status: DC
  Administered 2016-01-25 – 2016-01-26 (×2): 5 [IU] via SUBCUTANEOUS

## 2016-01-25 MED ORDER — INSULIN GLARGINE 100 UNIT/ML ~~LOC~~ SOLN
45.0000 [IU] | Freq: Every day | SUBCUTANEOUS | Status: DC
  Administered 2016-01-25: 45 [IU] via SUBCUTANEOUS
  Filled 2016-01-25 (×2): qty 0.45

## 2016-01-25 MED ORDER — LORAZEPAM 2 MG/ML IJ SOLN
1.0000 mg | Freq: Once | INTRAMUSCULAR | Status: AC
  Administered 2016-01-25: 1 mg via INTRAVENOUS
  Filled 2016-01-25: qty 1

## 2016-01-25 MED ORDER — POTASSIUM CHLORIDE CRYS ER 20 MEQ PO TBCR
40.0000 meq | EXTENDED_RELEASE_TABLET | Freq: Once | ORAL | Status: AC
  Administered 2016-01-25: 40 meq via ORAL
  Filled 2016-01-25: qty 2

## 2016-01-25 MED ORDER — ALPRAZOLAM 0.25 MG PO TABS
0.2500 mg | ORAL_TABLET | Freq: Every day | ORAL | Status: DC | PRN
  Administered 2016-01-25: 0.25 mg via ORAL
  Filled 2016-01-25: qty 1

## 2016-01-25 NOTE — Progress Notes (Addendum)
Inpatient Diabetes Program Recommendations  AACE/ADA: New Consensus Statement on Inpatient Glycemic Control (2015)  Target Ranges:  Prepandial:   less than 140 mg/dL      Peak postprandial:   less than 180 mg/dL (1-2 hours)      Critically ill patients:  140 - 180 mg/dL  Results for [name], [name] (MRN [id number]) as of 01/25/2016 11:38  Ref. Range 01/24/2016 11:55 01/24/2016 14:30 01/24/2016 15:36 01/24/2016 16:33 01/24/2016 18:08 01/24/2016 19:01 01/24/2016 19:46 01/24/2016 20:23 01/24/2016 21:33 01/24/2016 23:43 01/25/2016 00:38 01/25/2016 07:56  Glucose-Capillary Latest Ref Range: 65-99 mg/dL 409553 (HH) 811298 (H) 914298 (H) 243 (H) 163 (H) 160 (H) 177 (H) 170 (H) 159 (H) 129 (H) 168 (H) 173 (H)   Review of Glycemic Control  Outpatient Diabetes medications: Lantus 10 units daily, Novolog 6-15 units TID with meals Current orders for Inpatient glycemic control: Lantus 30 units QHS, Novolog 0-15 units TID with meals, Novolog 0-5 units QHS  Inpatient Diabetes Program Recommendations: Insulin - Basal: Patient was placed on IV insulin drip yesterday and transitioned from IV insulin to SQ insulin last night. Patient received Lantus 30 units at 23:00 on 01/24/16 and fasting glucose is 173 mg/dl today. Patient also received Lantus 25 units yesterday morning at 9:44am. Therefore she received a total dose of Lantus 55 units on 01/24/16. After discussion with Dr. Waymon AmatoHongalgi, he would like to have patient on once a day Lantus versus BID. Therefore, recommended Lantus 50 units QHS. Insulin - Meal Coverage: Please consider ordering Novolog 5 units TID with meals for meal coverage if patient eats at least 50% of meals.  Thanks, Orlando PennerMarie Dallan Schonberg, RN, MSN, CDE Diabetes Coordinator Inpatient Diabetes Program [phone] (Team Pager from 8am to 5pm) [phone] ([facility] office) [phone] [facility] office) [phone] [facility] office)

## 2016-01-25 NOTE — Progress Notes (Addendum)
Thank you for consult on [name]. [name] well known to CIR from prior stay and had difficulty tolerating therapy. She was sedentary with limited activity PTA and had aide to help with ADLs She continues to require encouragement to participate and needs to show improved tolerance of activity to justify CIR stay. Will defer CIR consult. .Marland Kitchen
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
## 2016-01-25 NOTE — Progress Notes (Signed)
Spoke with Dr Waymon AmatoHongalgi about patient's concerns with discharge plan. Patient came from CIR and would like to return at DC. Her husband and her cannot afford to turn over income for her to go to SNF, and the patient would not get PT needed through Medicaid if she would return to home. Dr Waymon AmatoHongalgi to place a CIR consult. Genie RN intake nurse CIR notified of situation.

## 2016-01-25 NOTE — Progress Notes (Signed)
Face sheet and Med necessity form for PTAR on front of chart with SS# and PTAR phone number.

## 2016-01-25 NOTE — Care Management Note (Addendum)
Case Management Note  Patient Details  Name: Evaristo BuryMary J Roh MRN: [id number] Date of Birth: March 16, 1961  Subjective/Objective:                 CM notified by CIR that patient would not be a CIR candidate at DC. Spoke with patient and husband at bedside. Grandville SilosJames Weeks [phone]. CM updated them that CIR would not be able to take them at discharge. They stated that would prefer to go home with home health in that case. [facility]. Mr Tania AdeWeeks is on disability and is home all day. They have used [facility] in the past and would like to use them again. They stated that patient had walker, 57 year old WC that didn't work well, hospital bed, BSC, and oxygen at home. Requested new WC. Requested PTAR for transport to be set up at DC coordinated with Mr Tania AdeWeeks. [facility].    Action/Plan:  Will need PTAR transport. Arrange with spouse Grandville SilosJames Weeks [phone]. [facility] Wheelchair will be delivered to house because patient will be dc'd via ambulance  Expected Discharge Date:                  Expected Discharge Plan:  Home w Home Health Services  In-House Referral:  Clinical Social Work  Discharge planning Services  CM Consult  Post Acute Care Choice:  Durable Medical Equipment, Home Health Choice offered to:  Patient, Spouse  DME Arranged:  Government social research officerWheelchair manual DME Agency:  [facility]  HH Arranged:  RN HH Agency:  [facility]  Status of Service:  Completed, signed off  Medicare Important Message Given:  Yes Date Medicare IM Given:    Medicare IM give by:    Date Additional Medicare IM Given:    Additional Medicare Important Message give by:     If discussed at Long Length of Stay Meetings, dates discussed:    Additional Comments:  Lawerance SabalDebbie Neoma Uhrich, RN 01/25/2016, 3:15 PM

## 2016-01-26 DIAGNOSIS — E1043 Type 1 diabetes mellitus with diabetic autonomic (poly)neuropathy: Secondary | ICD-10-CM

## 2016-01-26 DIAGNOSIS — E1065 Type 1 diabetes mellitus with hyperglycemia: Secondary | ICD-10-CM

## 2016-01-26 LAB — BASIC METABOLIC PANEL
ANION GAP: 11 (ref 5–15)
BUN: 11 mg/dL (ref 6–20)
CALCIUM: 8.9 mg/dL (ref 8.9–10.3)
CO2: 35 mmol/L — AB (ref 22–32)
Chloride: 92 mmol/L — ABNORMAL LOW (ref 101–111)
Creatinine, Ser: 0.64 mg/dL (ref 0.44–1.00)
GFR calc non Af Amer: >60 mL/min (ref >60)
GLUCOSE: 250 mg/dL — AB (ref 65–99)
POTASSIUM: 3.6 mmol/L (ref 3.5–5.1)
Sodium: 138 mmol/L (ref 135–145)

## 2016-01-26 LAB — GLUCOSE, CAPILLARY
GLUCOSE-CAPILLARY: 317 mg/dL — AB (ref 65–99)
Glucose-Capillary: 81 mg/dL (ref 65–99)

## 2016-01-26 MED ORDER — ASPIRIN EC 81 MG PO TBEC: 81.0000 mg | DELAYED_RELEASE_TABLET | Freq: Every day | ORAL | Status: AC

## 2016-01-26 MED ORDER — ALPRAZOLAM 0.5 MG PO TABS
0.2500 mg | ORAL_TABLET | Freq: Two times a day (BID) | ORAL | Status: AC | PRN
Start: 2016-01-26 — End: ?

## 2016-01-26 MED ORDER — INSULIN GLARGINE 100 UNIT/ML ~~LOC~~ SOLN: 45.0000 [IU] | Freq: Every day | SUBCUTANEOUS | Status: AC

## 2016-01-26 MED ORDER — ALBUTEROL SULFATE HFA 108 (90 BASE) MCG/ACT IN AERS: 2.0000 | INHALATION_SPRAY | Freq: Four times a day (QID) | RESPIRATORY_TRACT | Status: AC | PRN

## 2016-01-26 MED ORDER — RESOURCE THICKENUP CLEAR PO POWD: ORAL | Status: DC

## 2016-01-26 NOTE — Progress Notes (Signed)
NURSING PROGRESS NOTE  Elizabeth BuryMary J Soler [id number] Discharge Data: 01/26/2016 2:09 PM Attending Provider: Elease EtienneAnand D Hongalgi, MD NWG:NFAOZPCP:Edwin Kathlynn GrateA Avbuere, MD     [name] to be D/C'd Home per MD order.  Discussed with the patient the After Visit Summary and all questions fully answered. All IV's discontinued with no bleeding noted. All belongings returned to patient for patient to take home. D/C instructions given to pt and pts husband over the phone. Pt d/c'd with stretcher via PTAR.    Last Vital Signs:  Blood pressure 109/51, pulse 96, temperature 98.1 F (36.7 C), temperature source Oral, resp. rate 20, weight 82.3 kg (181 lb 7 oz), SpO2 96 %.  Discharge Medication List   Medication List    STOP taking these medications        acetaminophen 500 MG tablet  Commonly known as:  TYLENOL     guaiFENesin 600 MG 12 hr tablet  Commonly known as:  MUCINEX     guaiFENesin-dextromethorphan 100-10 MG/5ML syrup  Commonly known as:  ROBITUSSIN DM      TAKE these medications        albuterol 108 (90 Base) MCG/ACT inhaler  Commonly known as:  PROAIR HFA  Inhale 2 puffs into the lungs every 6 (six) hours as needed for wheezing or shortness of breath. scheduled     ALPRAZolam 0.5 MG tablet  Commonly known as:  XANAX  Take 0.5 tablets (0.25 mg total) by mouth 2 (two) times daily as needed for anxiety.     ARIPiprazole 5 MG tablet  Commonly known as:  ABILIFY  Take 5 mg by mouth daily.     aspirin EC 81 MG tablet  Take 1 tablet (81 mg total) by mouth daily.     cetirizine 10 MG tablet  Commonly known as:  ZYRTEC  Take 10 mg by mouth daily.     divalproex 250 MG DR tablet  Commonly known as:  DEPAKOTE  Take 1 tablet (250 mg total) by mouth every 12 (twelve) hours.     furosemide 40 MG tablet  Commonly known as:  LASIX  Take 1 tablet (40 mg total) by mouth 2 (two) times daily.     insulin glargine 100 UNIT/ML injection  Commonly known as:  LANTUS  Inject 0.45 mLs (45 Units total)  into the skin at bedtime.     ipratropium-albuterol 0.5-2.5 (3) MG/3ML Soln  Commonly known as:  DUONEB  Take 3 mLs by nebulization every 2 (two) hours as needed.     ipratropium-albuterol 0.5-2.5 (3) MG/3ML Soln  Commonly known as:  DUONEB  Take 3 mLs by nebulization every 6 (six) hours.     levothyroxine 125 MCG tablet  Commonly known as:  SYNTHROID, LEVOTHROID  Take 1 tablet (125 mcg total) by mouth daily before breakfast.     losartan 100 MG tablet  Commonly known as:  COZAAR  Take 1 tablet (100 mg total) by mouth daily.     metoprolol tartrate 25 MG tablet  Commonly known as:  LOPRESSOR  Take 0.5 tablets (12.5 mg total) by mouth 2 (two) times daily.     NOVOLOG FLEXPEN 100 UNIT/ML FlexPen  Generic drug:  insulin aspart  Inject 6-15 Units into the skin 3 (three) times daily as needed for high blood sugar (CBG >100). CBG 100-200 6-8 units, >200 15 units     omeprazole 20 MG capsule  Commonly known as:  PRILOSEC  Take 20 mg by mouth daily.  OXYGEN  Inhale into the lungs continuous. 3L     potassium chloride SA 20 MEQ tablet  Commonly known as:  K-DUR,KLOR-CON  Take 1 tablet (20 mEq total) by mouth daily.     RESOURCE THICKENUP CLEAR Powd  Oral, As needed, other,     simvastatin 10 MG tablet  Commonly known as:  ZOCOR  Take 10 mg by mouth daily.         Elizabeth Pierini, RN

## 2016-01-26 NOTE — Discharge Summary (Signed)
Physician Discharge Summary  [name]  ZOX:[id number]  DOB: 1961-01-28  DOA: 01/11/2016  PCP: Elizabeth German, MD  Admit date: 01/11/2016 Discharge date: 01/26/2016  Time spent: Greater than 30 minutes  Recommendations for Outpatient Follow-up:  1. Dr. Fleet Contras, PCP in 5 days with repeat labs (CBC & BMP). 2. Recommend repeat chest x-ray in 3-4 weeks. 3. [facility] in 1 week. 4. Home health RN   Discharge Diagnoses:  Principal Problem:   Acute on chronic respiratory failure (HCC) Active Problems:   Diabetes mellitus, insulin dependent (IDDM), uncontrolled (HCC)   HTN (hypertension)   COPD (chronic obstructive pulmonary disease) (HCC)   Physical deconditioning   GERD without esophagitis   Bipolar I disorder (HCC)   Acute on chronic diastolic CHF (congestive heart failure), NYHA class 1 (HCC)   Seizures (HCC)   Acute respiratory failure with hypoxia (HCC)   Respiratory failure (HCC)   Hyperlipidemia   Rheumatic mitral stenosis   Discharge Condition: Improved & Stable  Diet recommendation: Dysphagia 3 diet and thin liquids with diabetic and heart healthy modifications.  Filed Weights   01/24/16 0600 01/25/16 0600 01/26/16 0500  Weight: 82.2 kg (181 lb 3.5 oz) 81.6 kg (179 lb 14.3 oz) 82.3 kg (181 lb 7 oz)    History of present illness:  55 year old female patient with a PMH of chronic HFpEF, mod-sev mitral stenosis from rheumatic fever, type II DM/ IDDM, HTN, anemia, HLD, bipolar disorder, emphysema, chronic respiratory failure on home oxygen 2 L/m, GERD, COPD, hypothyroid, seizures presented to ED on 01/11/16 with acute respiratory failure and acute encephalopathy. She had recently been hospitalized 12/22/15-01/09/16 for acute hypoxic respiratory failure, VDRF, ARDS secondary to aspiration, COPD exacerbation, acute on chronic diastolic CHF. Post discharge she went to inpatient rehabilitation on 4/18 where she continued to complain of dyspnea, had bouts of  lethargy and urinary retention. Blood sugars and rehabilitation were poorly controlled. On 4/20, she developed hypoxia and decreased mental status, decompensated CHF, placed on NRB, [facility] hospitalists were consulted and patient was transferred back to the hospital stepdown unit. On 4/21, her respiratory failure worsened despite BiPAP. She was transferred to ICU under CCM care. She was intubated 4/21. Following extubation and medical stabilization, patient was transferred to [facility] on 02/18/16.  Hospital Course:   Acute on chronic hypoxic & hypercapnic respiratory failure - During this admission, initial presentation was felt to be secondary to acute on chronic diastolic CHF related primarily to mitral stenosis, staphylococcal pneumonia versus ALI. - Last echo showed EF 60% with grade 2 diastolic dysfunction. - Prior home dose of Lasix was 40 mg twice daily but was on only 40 mg once daily at inpatient rehabilitation. - Diuresed with IV Lasix - Acute respiratory failure resolved. Stable. - As per RN's discussion with spouse, patient uses home oxygen 3 L/m at bedtime and in the daytime as needed.  Acute on chronic diastolic CHF - Improved and stable. Initially treated with IV Lasix with good diuresis. Cardiology consulted. Continue Lasix 40 mg twice a day. - -14.6 L since admission. - May consider outpatient cardiology follow-up as deemed necessary.  COPD - Stable  Staphylococcal healthcare associated pneumonia - Completed treatment. Tracheostomy culture grew MSSA.  Klebsiella/Escherichia coli UTI - Completed treatment  Diarrhea - C. difficile antigen positive but toxin negative. Due to ongoing diarrhea, treated and completed course of Flagyl. Improved/resolved.  Seizure disorder - No recent seizures reported. Continue Depakote.  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 Due to  hypoglycemic episodes, Lantus was reduced to 10 units during prior hospitalization. - During this admission, Insulins were gradually titrated up to Lantus 25 units daily, moderate NovoLog SSI. Despite this, CBGs ranging in the 400-500 on 5/3. Patient is on diabetic diet, not on dextrose containing IV fluids and not on steroids. - Diabetes coordinator was consulted. - No DKA. - Most recent A1c: 6.7 - She was placed on insulin drip per glucose nebulizer on 5/3. After her CBGs improved, she was transitioned back to Lantus and SSI.  - Lantus adjusted to 45 units at bedtime. Fasting blood sugar 81. Continue this dose of Lantus and sliding scale at home. Close outpatient follow-up with PCP and may need further adjustment of diabetic medications. This was discussed with patient and with patient's significant other on 5/4. - Patient's significant other, Mr. [name] indicated that she had all her medications at home from prior to previous hospitalization. He was the one administering her her insulin's and other medications.  Hypothyroid - Continue Synthroid  Essential hypertension - Controlled on metoprolol. ARB which was held in the hospital will be resumed at discharge.  Hyperlipidemia - Continue statins  GERD - PPI  Bipolar disorder - Continue Abilify.  - Patient's chronic Xanax was discontinued after 4/21 dose. On 5/4, patient became very anxious and agitated and demanding for her "anxiety pill". She indicates that she has been on Xanax for years. Resumed Xanax at reduced dose as below. - She indicates that her PCP fills up prescription for Xanax and [facility].  Hypokalemia/hypomagnesemia/hypophosphatemia - Replaced. Follow BMP as outpatient.  Rheumatic Mitral stenosis, severe  - Lopressor was added by cardiology. - After patient was stable from a respiratory standpoint, she underwent TEE on 5/1 to further evaluate. Noted to have moderate mitral valve  stenosis and does not appear that intervention on mitral stenosis is indicated at this time.  Hypernatremia - Resolved.  Anemia - Stable.  Dysphagia - Speech therapy has advanced diet to dysphagia 3 and thin liquids.   Consultants:   CCM  Cardiology  Procedures:   BiPAP  Intubation 4/21 >extubated 4/26  Central line-discontinued   Discharge Exam:  Complaints:  Anxious to go home. Denies complaints. No chest pain, dyspnea, palpitations, dizziness or lightheadedness. No diarrhea reported. Denies anxiety. As per RN, no acute issues and no recurrent issues of agitation or anxiety attacks.  Filed Vitals:   01/25/16 2207 01/26/16 0500 01/26/16 0630 01/26/16 0840  BP: 116/55  110/56 98/63  Pulse: 97  91 85  Temp: 97.7 F (36.5 C)  98.3 F (36.8 C)   TempSrc:   Oral   Resp: 19  18   Weight:  82.3 kg (181 lb 7 oz)    SpO2: 91%  93%     General exam: Pleasant middle-aged female, sitting up comfortably in bed. Appears calm and comfortable. Respiratory system: Clear. No increased work of breathing. Cardiovascular system: S1 & S2 heard, RRR. No JVD, murmurs, gallops, clicks. Trace bilateral ankle edema. Gastrointestinal system: Abdomen is nondistended, soft and nontender. Normal bowel sounds heard. Central nervous system: Alert and oriented. No focal neurological deficits. Extremities: Symmetric 5 x 5 power. Psychiatry: Pleasant and cooperative. Answers questions appropriately. Does not appear anxious or irritable. No SI/HI  Discharge Instructions      Discharge Instructions    (HEART FAILURE PATIENTS) Call MD:  Anytime you have any of  the following symptoms: 1) 3 pound weight gain in 24 hours or 5 pounds in 1 week 2) shortness of breath, with or without a dry hacking cough 3) swelling in the hands, feet or stomach 4) if you have to sleep on extra pillows at night in order to breathe.    Complete by:  As directed      Call MD for:  difficulty breathing, headache or  visual disturbances    Complete by:  As directed      Call MD for:  extreme fatigue    Complete by:  As directed      Call MD for:  persistant dizziness or light-headedness    Complete by:  As directed      Call MD for:  persistant nausea and vomiting    Complete by:  As directed      Call MD for:  severe uncontrolled pain    Complete by:  As directed      Call MD for:  temperature >100.4    Complete by:  As directed      Discharge instructions    Complete by:  As directed   DIET: Dysphagia 3 diet and thin liquids (diabetic and heart healthy).     Increase activity slowly    Complete by:  As directed             Medication List    STOP taking these medications        acetaminophen 500 MG tablet  Commonly known as:  TYLENOL     guaiFENesin 600 MG 12 hr tablet  Commonly known as:  MUCINEX     guaiFENesin-dextromethorphan 100-10 MG/5ML syrup  Commonly known as:  ROBITUSSIN DM      TAKE these medications        albuterol 108 (90 Base) MCG/ACT inhaler  Commonly known as:  PROAIR HFA  Inhale 2 puffs into the lungs every 6 (six) hours as needed for wheezing or shortness of breath. scheduled     ALPRAZolam 0.5 MG tablet  Commonly known as:  XANAX  Take 0.5 tablets (0.25 mg total) by mouth 2 (two) times daily as needed for anxiety.     ARIPiprazole 5 MG tablet  Commonly known as:  ABILIFY  Take 5 mg by mouth daily.     aspirin EC 81 MG tablet  Take 1 tablet (81 mg total) by mouth daily.     cetirizine 10 MG tablet  Commonly known as:  ZYRTEC  Take 10 mg by mouth daily.     divalproex 250 MG DR tablet  Commonly known as:  DEPAKOTE  Take 1 tablet (250 mg total) by mouth every 12 (twelve) hours.     furosemide 40 MG tablet  Commonly known as:  LASIX  Take 1 tablet (40 mg total) by mouth 2 (two) times daily.     insulin glargine 100 UNIT/ML injection  Commonly known as:  LANTUS  Inject 0.45 mLs (45 Units total) into the skin at bedtime.     ipratropium-albuterol  0.5-2.5 (3) MG/3ML Soln  Commonly known as:  DUONEB  Take 3 mLs by nebulization every 2 (two) hours as needed.     ipratropium-albuterol 0.5-2.5 (3) MG/3ML Soln  Commonly known as:  DUONEB  Take 3 mLs by nebulization every 6 (six) hours.     levothyroxine 125 MCG tablet  Commonly known as:  SYNTHROID, LEVOTHROID  Take 1 tablet (125 mcg total) by mouth daily before breakfast.  losartan 100 MG tablet  Commonly known as:  COZAAR  Take 1 tablet (100 mg total) by mouth daily.     metoprolol tartrate 25 MG tablet  Commonly known as:  LOPRESSOR  Take 0.5 tablets (12.5 mg total) by mouth 2 (two) times daily.     NOVOLOG FLEXPEN 100 UNIT/ML FlexPen  Generic drug:  insulin aspart  Inject 6-15 Units into the skin 3 (three) times daily as needed for high blood sugar (CBG >100). CBG 100-200 6-8 units, >200 15 units     omeprazole 20 MG capsule  Commonly known as:  PRILOSEC  Take 20 mg by mouth daily.     OXYGEN  Inhale into the lungs continuous. 3L     potassium chloride SA 20 MEQ tablet  Commonly known as:  K-DUR,KLOR-CON  Take 1 tablet (20 mEq total) by mouth daily.     RESOURCE THICKENUP CLEAR Powd  Oral, As needed, other,     simvastatin 10 MG tablet  Commonly known as:  ZOCOR  Take 10 mg by mouth daily.       Follow-up Information    Follow up with [facility].   Why:  wheelchair to be delivered to house because it cannot travel with patient in ambulance   Contact information:   [address] [place] [address] [phone]       Follow up with Elizabeth German, MD. Schedule an appointment as soon as possible for a visit in 5 days.   Specialty:  Internal Medicine   Why:  To be seen with repeat labs (CBC & BMP). May need adjustment of diabetes medications. Post hospital discharge.   Contact information:   [address] [place] [address] [phone]       Follow up with [facility]. Schedule an appointment as soon as possible for a  visit in 1 week.   Specialty:  [facility] information:   [address] [place] [address] [phone]       Follow up with [facility].   Why:  Home Health RN to call and set up first visit in the next 1-2 days.   Contact information:   [address] [place] [address] [phone]       Get Medicines reviewed and adjusted: Please take all your medications with you for your next visit with your Primary MD  Please request your Primary MD to go over all hospital tests and procedure/radiological results at the follow up. Please ask your Primary MD to get all Hospital records sent to his/her office.  If you experience worsening of your admission symptoms, develop shortness of breath, life threatening emergency, suicidal or homicidal thoughts you must seek medical attention immediately by calling 911 or calling your MD immediately if symptoms less severe.  You must read complete instructions/literature along with all the possible adverse reactions/side effects for all the Medicines you take and that have been prescribed to you. Take any new Medicines after you have completely understood and accept all the possible adverse reactions/side effects.   Do not drive when taking pain medications.   Do not take more than prescribed Pain, Sleep and Anxiety Medications  Special Instructions: If you have smoked or chewed Tobacco in the last 2 yrs please stop smoking, stop any regular Alcohol and or any Recreational drug use.  Wear Seat belts while driving.  Please note  You were cared for by a hospitalist during  your hospital stay. Once you are discharged, your primary care physician will handle any further medical issues. Please note that NO REFILLS for any discharge medications will be authorized once you are discharged, as it is imperative that you return to your primary care physician (or establish a relationship with a primary care  physician if you do not have one) for your aftercare needs so that they can reassess your need for medications and monitor your lab values.    The results of significant diagnostics from this hospitalization (including imaging, microbiology, ancillary and laboratory) are listed below for reference.    Significant Diagnostic Studies: Dg Chest 2 View  01/08/2016  CLINICAL DATA:  Shortness of breath and weakness EXAM: CHEST  2 VIEW COMPARISON:  January 06, 2016 FINDINGS: There is interstitial edema with cardiomegaly and mild pulmonary venous hypertension. There is patchy atelectasis in both lower lung zones. There is no airspace consolidation. No adenopathy. The patient is status post left total shoulder replacement as well as postoperative change in the proximal right humerus. There is postoperative change in the lower cervical region as well. IMPRESSION: Findings consistent with a degree of congestive heart failure. Areas of atelectasis in both lower lung zones. No airspace consolidation. Electronically Signed   By: Bretta Bang III M.D.   On: 01/08/2016 19:05   Dg Chest 2 View  01/06/2016  CLINICAL DATA:  55 year old female with a several hour history of dry cough and dyspnea. Medical history includes COPD and CHF. EXAM: CHEST  2 VIEW COMPARISON:  Prior chest x-ray 01/04/2016 FINDINGS: Left IJ approach central venous catheter remains in stable position with the tip projecting over the superior cavoatrial junction. The feeding tube is been removed. Stable cardiac and mediastinal contours. Improved pulmonary edema compared to prior imaging. There is still some persistent pulmonary vascular congestion. No new focal airspace consolidation, large effusion or evidence of pneumothorax. Incompletely imaged cervical thoracic stabilization hardware, left shoulder arthroplasty prosthesis and ORIF of a right proximal humerus fracture. IMPRESSION: 1. Improved pulmonary edema compared to 01/04/2016. There is mild  residual pulmonary vascular congestion. 2. Stable cardiomegaly. 3. No new or acute cardiopulmonary process. Electronically Signed   By: Malachy Moan M.D.   On: 01/06/2016 10:29   Ct Head Wo Contrast  01/01/2016  CLINICAL DATA:  Acute encephalopathy. EXAM: CT HEAD WITHOUT CONTRAST TECHNIQUE: Contiguous axial images were obtained from the base of the skull through the vertex without intravenous contrast. COMPARISON:  CT scan of October 16, 2015. FINDINGS: Fluid is noted in the mastoid air cells bilaterally. Bony calvarium appears intact. Stable diffuse cortical atrophy is noted. Mild chronic ischemic white matter disease is noted. No mass effect or midline shift is noted. Ventricular size is within normal limits. There is no evidence of mass lesion, hemorrhage or acute infarction. IMPRESSION: Stable diffuse cortical atrophy. Mild chronic ischemic white matter disease. No acute intracranial abnormality seen. Electronically Signed   By: Lupita Raider, M.D.   On: 01/01/2016 15:31   Ct Chest Wo Contrast  01/14/2016  CLINICAL DATA:  Acute respiratory distress requiring intubation 3 days ago. Worsening hypoxemia. EXAM: CT CHEST WITHOUT CONTRAST TECHNIQUE: Multidetector CT imaging of the chest was performed following the standard protocol without IV contrast. COMPARISON:  Radiographs 01/14/2016 and 01/13/2016.  CT 09/11/2013. FINDINGS: Mediastinum/Nodes: Small mediastinal and hilar lymph nodes bilaterally are similar to the prior CT. Hilar assessment is limited by the lack of intravenous contrastand perihilar pulmonary opacities.There is no axillary adenopathy. Endotracheal tube, nasogastric tube and  right IJ central venous catheter are in place. There is mild cardiomegaly. No significant pericardial fluid. Mild atherosclerosis is present. Lungs/Pleura: Small bilateral pleural effusions. There are patchy airspace opacities dependently in both lungs with mild volume loss in the lower lobes. There are patchy  ground-glass opacities throughout the aerated lungs. No endobronchial lesion or suspicious pulmonary nodule. Upper abdomen: The visualized upper abdomen demonstrates no suspicious findings. Musculoskeletal/Chest wall: There is mild generalized edema within the chest wall. No acute osseous findings are seen. There is stable chronic sternal deformity. There are postsurgical changes in the lower cervical spine and both proximal humeri. IMPRESSION: 1. Dependent airspace opacities in both lungs with associated volume loss, similar to recent radiographs. Although likely in part secondary to atelectasis, distribution is suspicious for infection, possibly on the basis of aspiration. 2. Ground-glass opacities in the aerated lungs consistent with mild edema. 3. Small bilateral pleural effusions. Electronically Signed   By: Carey Bullocks M.D.   On: 01/14/2016 17:14   Dg Chest Port 1 View  01/18/2016  CLINICAL DATA:  Acute respiratory failure with hypoxia. Shortness of breath today. EXAM: PORTABLE CHEST 1 VIEW COMPARISON:  01/17/2016 and 01/16/2016 FINDINGS: 0717 hours. Interval extubation and removal of the nasogastric tube. There is stable cardiomegaly. Pulmonary edema has improved, although there is an asymmetric component in the right upper lobe which appears slightly worse. No pneumothorax or significant pleural effusion is seen. Old rib fractures are present on the left. There are postsurgical changes in the cervical spine and both humeri. IMPRESSION: Overall improvement in pulmonary edema following extubation. There is asymmetric airspace disease in the right upper lobe which has mildly progressed. This may reflect asymmetric edema or developing infiltrate. Electronically Signed   By: Carey Bullocks M.D.   On: 01/18/2016 07:33   Dg Chest Port 1 View  01/17/2016  CLINICAL DATA:  CHF. EXAM: PORTABLE CHEST 1 VIEW COMPARISON:  01/16/2016. FINDINGS: Interim removal right IJ line. Endotracheal tube, NG tube in stable  position . Cardiomegaly. Persistent but improving bilateral patchy pulmonary infiltrates and/or edema. No pleural effusion or pneumothorax. Prior cervical spine fusion. Postsurgical changes both shoulders. IMPRESSION: 1. Stable cardiomegaly. 2. Persistent but slightly improving patchy bilateral pulmonary infiltrates and/or pulmonary edema. Electronically Signed   By: Maisie Fus  Register   On: 01/17/2016 07:23   Dg Chest Port 1 View  01/16/2016  CLINICAL DATA:  Pneumonia EXAM: PORTABLE CHEST 1 VIEW COMPARISON:  January 15, 2016 FINDINGS: The heart size and mediastinal contours are stable. The heart size is enlarged. Endotracheal tube is identified distal tip 5 cm from carina. Right central venous line is noted distal tip in the superior vena cava. Nasogastric tube is identified distal tip is not included on film but is at least in the stomach. There is persistent pulmonary vascular congestion with enlargement of perihilar central pulmonary vessels unchanged. Mild hazy opacity of bilateral lung bases are identified unchanged. There is no pleural effusion. The visualized skeletal structures are stable. IMPRESSION: Mild central pulmonary vascular congestion unchanged compared to prior exam. Mild hazy airspace disease in bilateral lung bases not significantly changed compared to prior exam. Electronically Signed   By: Sherian Rein M.D.   On: 01/16/2016 07:22   Dg Chest Port 1 View  01/15/2016  CLINICAL DATA:  Respiratory failure EXAM: PORTABLE CHEST 1 VIEW COMPARISON:  01/14/2016 FINDINGS: Central line endotracheal tube and orogastric tube appear unchanged. Mild cardiac silhouette enlargement stable. There is vascular congestion with indistinct perihilar vasculature bilaterally. Hazy density at both  lung bases again identified. IMPRESSION: Bilateral airspace disease is mildly improved when compared to prior study. Electronically Signed   By: Esperanza Heir M.D.   On: 01/15/2016 07:19   Dg Chest Port 1  View  01/14/2016  CLINICAL DATA:  Acute respiratory failure. On ventilator. Congestive heart failure. COPD. EXAM: PORTABLE CHEST 1 VIEW COMPARISON:  01/13/2016 FINDINGS: Patient is rotated to the right. Endotracheal tube, nasogastric tube, and right jugular central venous catheter remain in appropriate position. Cardiomegaly stable. Symmetric bilateral airspace disease with bibasilar predominance shows no significant change, most likely due to pulmonary edema or ARDS. No pneumothorax visualized. No definite pleural effusion noted on this portable exam. IMPRESSION: Stable cardiomegaly and symmetric bilateral airspace disease, most likely due to pulmonary edema or ARDS. Electronically Signed   By: Myles Rosenthal M.D.   On: 01/14/2016 08:32   Dg Chest Port 1 View  01/12/2016  CLINICAL DATA:  Encounter for central line placement EXAM: PORTABLE CHEST 1 VIEW COMPARISON:  Portable exam 1628 hours compared to 01/12/2016 FINDINGS: Tip of endotracheal tube projects 5.6 cm above carina. Nasogastric tube extends into stomach. New RIGHT jugular central venous catheter with tip projecting over SVC. Enlargement of cardiac silhouette with pulmonary vascular congestion. BILATERAL pulmonary infiltrates question pulmonary edema. Suspect small bibasilar effusions. No pneumothorax. Prior cervical spine fusion. IMPRESSION: No pneumothorax following RIGHT jugular line placement. Enlargement of cardiac silhouette with vascular congestion and suspected BILATERAL pulmonary edema. Electronically Signed   By: Ulyses Southward M.D.   On: 01/12/2016 16:50   Dg Chest Port 1 View  01/12/2016  CLINICAL DATA:  Respiratory distress, recent intubation EXAM: PORTABLE CHEST 1 VIEW COMPARISON:  01/12/2016 FINDINGS: Cardiac shadow is stable. Bibasilar densities are again seen and stable. Mild vascular congestion is again noted. A nasogastric catheter is now noted within the stomach. An endotracheal tube is seen approximately 3.8 cm above the carina.  Postsurgical changes are noted in both shoulders and lower cervical spine. No acute bony abnormality is seen. IMPRESSION: Stable appearance of the chest. The endotracheal tube and nasogastric catheter are in satisfactory position. Electronically Signed   By: Alcide Clever M.D.   On: 01/12/2016 10:43   Dg Chest Port 1 View  01/12/2016  CLINICAL DATA:  Acute renal failure ; congestive heart failure EXAM: PORTABLE CHEST 1 VIEW COMPARISON:  January 11, 2016 FINDINGS: There is interstitial edema throughout the lungs with patchy alveolar edema in the bases. There is cardiomegaly with pulmonary venous hypertension. There is a equivocal small pleural effusions bilaterally. There is postoperative change in the proximal right humerus. There is a total shoulder replacement on the left. There is postoperative change in the lower cervical spine. IMPRESSION: Congestive heart failure persists. Superimposed pneumonia in the bases cannot be excluded radiographically. Appearance is quite similar to 1 day prior. Electronically Signed   By: Bretta Bang III M.D.   On: 01/12/2016 07:29   Dg Chest Port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

## 2016-01-26 NOTE — Progress Notes (Signed)
Patient's husband called to clarify medication list at discharge and that gabapentin was not on that list. I stated that medication was not on patient's prior to arrival medication list, so if he has questions if she should take it then he needs to ask her primary care physician.

## 2016-01-26 NOTE — Progress Notes (Signed)
Speech Language Pathology Treatment: Dysphagia  Patient Details Name: Evaristo BuryMary J Rought MRN: [id number] DOB: 1961/03/12 Today's Date: 01/26/2016 Time: [id number] SLP Time Calculation (min) (ACUTE ONLY): 20 min  Assessment / Plan / Recommendation Clinical Impression  Patient seen to address dysphagia goals with current diet consistencies of Dysphagia 3 and thin liquids. Patient did not exhibit any overt s/s of aspiration or difficulty with swallowing thin liquids from straw. She did not her oxygen on when SLP entered the room, but she did not exhibit any overt s/s of respiratory distress or difficulty. Patient seen with small amount of dysphagia 3 solids, and she did not exhibit any significant difficulty with it and fully cleared oral cavity. Patient stated that she eats softer food at home and does not wear her dentures unless she is "eating peanuts".   HPI HPI: [id number] year old woman admitted on 03/31 with acute HF exacerbation 2/2 non-compliance with diuretic therapy. The patient was doing fairly well with diuresis until 12/25/15 when she developed significant respiratory distress. Her O2 sats were in the 70s on 100% NRB and high flow [place]. She was intolerant of BiPAP and the critical care team was subsequently consulted for intubation.  She vomited large volume of emesis during intubation and subsequently had what appeared to be significant aspiration event. Pt intubated from 4/3-4/12. Pt was seen for BSE on 4/13 and made NPO due to signs of acute reversible dysphagia following intubation. On 4/14 pt showed improvement and was started on dys 3/thin diet and SLP signed off. Then on 4/16 pt observed to have difficutly with meal, coughing and gurgling. SLP reordered. CXR on 4/15 shows no new acute process. Restarted on diet, went to CIR, CHF exacerbation, became poorly responsive, reintubated for 4/21 to 4/26.       SLP Plan  Continue with current plan of care     Recommendations  Diet recommendations:  Dysphagia 3 (mechanical soft) Liquids provided via: Straw;Cup Medication Administration: Whole meds with puree Supervision: Patient able to self feed;Intermittent supervision to cue for compensatory strategies Compensations: Minimize environmental distractions;Slow rate;Small sips/bites;Follow solids with liquid Postural Changes and/or Swallow Maneuvers: Seated upright 90 degrees;Upright 30-60 min after meal             Oral Care Recommendations: Oral care BID Follow up Recommendations: Home health SLP Plan: Continue with current plan of care     GO                Pablo Lawrencereston, John Tarrell 01/26/2016, 2:10 PM  Angela NevinJohn T. Preston, MA, CCC-SLP 01/26/2016 2:13 PM

## 2016-01-26 NOTE — Discharge Instructions (Addendum)
Acute Respiratory Failure  Respiratory failure is when your lungs are not working well and your breathing (respiratory) system fails. When respiratory failure occurs, it is difficult for your lungs to get enough oxygen, get rid of carbon dioxide, or both. Respiratory failure can be life threatening.  Respiratory failure can be acute or chronic. Acute respiratory failure is sudden, severe, and requires emergency medical treatment. Chronic respiratory failure is less severe, happens over time, and requires ongoing treatment.  WHAT ARE THE CAUSES OF ACUTE RESPIRATORY FAILURE?  Any problem affecting the heart or lungs can cause acute respiratory failure. Some of these causes include the following:  Chronic bronchitis and emphysema (COPD).   Blood clot going to a lung (pulmonary embolism).   Having water in the lungs caused by heart failure, lung injury, or infection (pulmonary edema).   Collapsed lung (pneumothorax).   Pneumonia.   Pulmonary fibrosis.   Obesity.   Asthma.   Heart failure.   Any type of trauma to the chest that can make breathing difficult.   Nerve or muscle diseases making chest movements difficult. HOW WILL MY ACUTE RESPIRATORY FAILURE BE TREATED?  Treatment of acute respiratory failure depends on the cause of the respiratory failure. Usually, you will stay in the intensive care unit so your breathing can be watched closely. Treatment can include the following:  Oxygen. Oxygen can be delivered through the following:  Nasal cannula. This is small tubing that goes in your nose to give you oxygen.  Face mask. A face mask covers your nose and mouth to give you oxygen.  Medicine. Different medicines can be given to help with breathing. These can include:  Nebulizers. Nebulizers deliver medicines to open the air passages (bronchodilators). These medicines help to open or relax the airways in the lungs so you can breathe better. They can also help loosen mucus  from your lungs.  Diuretics. Diuretic medicines can help you breathe better by getting rid of extra water in your body.  Steroids. Steroid medicines can help decrease swelling (inflammation) in your lungs.  Antibiotics.  Chest tube. If you have a collapsed lung (pneumothorax), a chest tube is placed to help reinflate the lung.  Noninvasive positive pressure ventilation (NPPV). This is a tight-fitting mask that goes over your nose and mouth. The mask has tubing that is attached to a machine. The machine blows air into the tubing, which helps to keep the tiny air sacs (alveoli) in your lungs open. This machine allows you to breathe on your own.  Ventilator. A ventilator is a breathing machine. When on a ventilator, a breathing tube is put into the lungs. A ventilator is used when you can no longer breathe well enough on your own. You may have low oxygen levels or high carbon dioxide (CO2) levels in your blood. When you are on a ventilator, sedation and pain medicines are given to make you sleep so your lungs can heal. SEEK IMMEDIATE MEDICAL CARE IF:  You have shortness of breath (dyspnea) with or without activity.  You have rapid breathing (tachypnea).  You are wheezing.  You are unable to say more than a few words without having to catch your breath.  You find it very difficult to function normally.  You have a fast heart rate.  You have a bluish color to your finger or toe nail beds.  You have confusion or drowsiness or both.   This information is not intended to replace advice given to you by your health care provider.  Make sure you discuss any questions you have with your health care provider.   Document Released: 09/14/2013 Document Revised: 05/31/2015 Document Reviewed: 09/14/2013 Elsevier Interactive Patient Education 2016 [employer]   Dysphagia Diet Level 3, Mechanically Advanced The dysphagia level 3 diet includes foods that are soft, moist, and can be chopped into  1-inch chunks. This diet is helpful for people with mild swallowing difficulties. It reduces the risk of food getting caught in the windpipe, trachea, or lungs. WHAT DO I NEED TO KNOW ABOUT THIS DIET? You may eat foods that are soft and moist. If you were on the dysphagia level 1 or level 2 diets, you may eat any of the foods included on those lists. Avoid foods that are dry, hard, sticky, chewy, coarse, and crunchy. Also avoid large cuts of food. Take small bites. Each bite should contain 1 inch or less of food. Thicken liquids if instructed by your health care provider. Follow your health care provider's instructions on how to do this and to what consistency. See your dietitian or speech language pathologist regularly for help with your dietary changes. WHAT FOODS CAN I EAT? Grains Moist breads without nuts or seeds. Biscuits, muffins, pancakes, and waffles well-moistened with syrup, jelly, margarine, or butter. Smooth cereals with plenty of milk to moisten them. Moist bread stuffing. Moist rice. Vegetables All cooked, soft vegetables. Shredded lettuce. Tender fried potatoes. Fruits All canned and cooked fruits. Soft, peeled fresh fruits, such as peaches, nectarines, kiwis, cantaloupe, honeydew melon, and watermelon without seeds. Soft berries, such as strawberries. Meat and Other Protein Sources Moist ground or finely diced or sliced meats. Solid, tender cuts of meat. Meatloaf. Hamburger with a bun. Sausage patty. Deli thin-sliced lunch meat. Chicken, egg, or tuna salad sandwich. Sloppy joe. Moist fish. Eggs prepared any way. Casseroles with small chunks of meats, ground meats, or tender meats. Dairy Cheese spreads without coarse large chunks. Shredded cheese. Cheese slices. Cottage cheese. Milk at the right texture. Smooth frappes. Yogurt without nuts or coconut. Ask your health care provider whether you can have frozen desserts (such as malts or milk shakes) and thin  liquids. Sweets/Desserts Soft, smooth, moist desserts. Non-chewy, smooth candy. Jam. Jelly. Honey. Preserves. Ask your health care provider whether you can have frozen desserts. Fats and Oils Butter. Oils. Margarine. Mayonnaise. Gravy. Spreads. Other All seasonings and sweeteners. All sauces without large chunks. The items listed above may not be a complete list of recommended foods or beverages. Contact your dietitian for more options. WHAT FOODS ARE NOT RECOMMENDED? Grains Coarse or dry cereals. Dry breads. Toast. Crackers. Tough, crusty breads, such French bread and baguettes. Tough, crisp fried potatoes. Potato skins. Dry bread stuffing. Granola. Popcorn. Chips. Vegetables All raw vegetables except shredded lettuce. Cooked corn. Rubbery or stiff cooked vegetables. Stringy vegetables, such as celery. Fruits Hard fruits that are difficult to chew, such as apples or pears. Stringy, high-pulp fruits, such as pineapple, papaya, or mango. Fruits with tough skins, such as grapes. Coconut. All dried fruits. Fruit leather. Fruit roll-ups. Fruit snacks. Meat and Other Protein Sources Dry or tough meats or poultry. Dry fish. Fish with bones. Peanut butter. All nuts and seeds. Dairy  Any with nuts, seeds, chocolate chips, dried fruit, coconut, or pineapple. Sweets/Desserts Dry cakes. Chewy or dry cookies. Any with nuts, seeds, dry fruits, coconut, pineapple, or anything dry, sticky, or hard. Chewy caramel. Licorice. Taffy-type candies. Ask your health care provider whether you can have frozen desserts. Fats and Oils Any with chunks, nuts,  seeds, or pineapple. Olives. Rosita Fire. Other Soups with tough or large chunks of meats, poultry, or vegetables. Corn or clam chowder. The items listed above may not be a complete list of foods and beverages to avoid. Contact your dietitian for more information.   This information is not intended to replace advice given to you by your health care provider. Make  sure you discuss any questions you have with your health care provider.   Document Released: 09/09/2005 Document Revised: 09/30/2014 Document Reviewed: 08/23/2013 Elsevier Interactive Patient Education [employer]

## 2016-01-26 NOTE — Progress Notes (Signed)
Spoke with Grandville SilosJames Weeks, spouse, he states he is ready for patient to come home any time, requested she have clean bedpan and diet information sent home with her. CM passed this on to [facility] LLPJessica RN. Shanda BumpsJessica RN verbalized understanding of how to call [facility] when she is ready to discharge patient.

## 2016-01-29 ENCOUNTER — Ambulatory Visit: Payer: Self-pay | Admitting: Nurse Practitioner

## 2016-02-16 ENCOUNTER — Other Ambulatory Visit: Payer: Self-pay

## 2016-02-16 MED ORDER — POTASSIUM CHLORIDE CRYS ER 20 MEQ PO TBCR: 20.0000 meq | EXTENDED_RELEASE_TABLET | Freq: Every day | ORAL | Status: AC

## 2016-03-01 ENCOUNTER — Ambulatory Visit: Payer: Medicaid Other | Admitting: Podiatry

## 2016-03-02 ENCOUNTER — Emergency Department (HOSPITAL_COMMUNITY): Payer: Medicaid Other

## 2016-03-02 ENCOUNTER — Encounter (HOSPITAL_COMMUNITY): Payer: Self-pay | Admitting: Emergency Medicine

## 2016-03-02 ENCOUNTER — Inpatient Hospital Stay (HOSPITAL_COMMUNITY)
Admission: EM | Admit: 2016-03-02 | Discharge: 2016-03-08 | DRG: 291 | Disposition: A | Discharge status: Home Health | Payer: Medicaid Other | Attending: Family Medicine | Admitting: Family Medicine

## 2016-03-02 ENCOUNTER — Inpatient Hospital Stay (HOSPITAL_COMMUNITY): Payer: Medicaid Other

## 2016-03-02 DIAGNOSIS — R0602 Shortness of breath: Secondary | ICD-10-CM | POA: Diagnosis present

## 2016-03-02 DIAGNOSIS — J449 Chronic obstructive pulmonary disease, unspecified: Secondary | ICD-10-CM | POA: Diagnosis present

## 2016-03-02 DIAGNOSIS — Z87891 Personal history of nicotine dependence: Secondary | ICD-10-CM | POA: Diagnosis not present

## 2016-03-02 DIAGNOSIS — I509 Heart failure, unspecified: Secondary | ICD-10-CM

## 2016-03-02 DIAGNOSIS — I5031 Acute diastolic (congestive) heart failure: Secondary | ICD-10-CM | POA: Diagnosis not present

## 2016-03-02 DIAGNOSIS — F411 Generalized anxiety disorder: Secondary | ICD-10-CM | POA: Diagnosis present

## 2016-03-02 DIAGNOSIS — J9621 Acute and chronic respiratory failure with hypoxia: Secondary | ICD-10-CM | POA: Diagnosis present

## 2016-03-02 DIAGNOSIS — E1065 Type 1 diabetes mellitus with hyperglycemia: Secondary | ICD-10-CM

## 2016-03-02 DIAGNOSIS — Z6841 Body Mass Index (BMI) 40.0 and over, adult: Secondary | ICD-10-CM

## 2016-03-02 DIAGNOSIS — D649 Anemia, unspecified: Secondary | ICD-10-CM | POA: Diagnosis present

## 2016-03-02 DIAGNOSIS — I5033 Acute on chronic diastolic (congestive) heart failure: Secondary | ICD-10-CM | POA: Diagnosis present

## 2016-03-02 DIAGNOSIS — F319 Bipolar disorder, unspecified: Secondary | ICD-10-CM | POA: Diagnosis present

## 2016-03-02 DIAGNOSIS — Z9981 Dependence on supplemental oxygen: Secondary | ICD-10-CM

## 2016-03-02 DIAGNOSIS — F40240 Claustrophobia: Secondary | ICD-10-CM | POA: Diagnosis present

## 2016-03-02 DIAGNOSIS — J441 Chronic obstructive pulmonary disease with (acute) exacerbation: Secondary | ICD-10-CM | POA: Diagnosis present

## 2016-03-02 DIAGNOSIS — D509 Iron deficiency anemia, unspecified: Secondary | ICD-10-CM | POA: Diagnosis present

## 2016-03-02 DIAGNOSIS — I252 Old myocardial infarction: Secondary | ICD-10-CM | POA: Diagnosis not present

## 2016-03-02 DIAGNOSIS — J302 Other seasonal allergic rhinitis: Secondary | ICD-10-CM | POA: Diagnosis present

## 2016-03-02 DIAGNOSIS — Z7982 Long term (current) use of aspirin: Secondary | ICD-10-CM

## 2016-03-02 DIAGNOSIS — J690 Pneumonitis due to inhalation of food and vomit: Secondary | ICD-10-CM | POA: Diagnosis present

## 2016-03-02 DIAGNOSIS — E039 Hypothyroidism, unspecified: Secondary | ICD-10-CM | POA: Diagnosis present

## 2016-03-02 DIAGNOSIS — Z794 Long term (current) use of insulin: Secondary | ICD-10-CM | POA: Diagnosis not present

## 2016-03-02 DIAGNOSIS — E038 Other specified hypothyroidism: Secondary | ICD-10-CM

## 2016-03-02 DIAGNOSIS — E1043 Type 1 diabetes mellitus with diabetic autonomic (poly)neuropathy: Secondary | ICD-10-CM | POA: Diagnosis not present

## 2016-03-02 DIAGNOSIS — I110 Hypertensive heart disease with heart failure: Secondary | ICD-10-CM | POA: Diagnosis present

## 2016-03-02 DIAGNOSIS — R0600 Dyspnea, unspecified: Secondary | ICD-10-CM

## 2016-03-02 DIAGNOSIS — E785 Hyperlipidemia, unspecified: Secondary | ICD-10-CM | POA: Diagnosis not present

## 2016-03-02 DIAGNOSIS — J438 Other emphysema: Secondary | ICD-10-CM | POA: Diagnosis not present

## 2016-03-02 DIAGNOSIS — E1151 Type 2 diabetes mellitus with diabetic peripheral angiopathy without gangrene: Secondary | ICD-10-CM | POA: Diagnosis present

## 2016-03-02 DIAGNOSIS — K219 Gastro-esophageal reflux disease without esophagitis: Secondary | ICD-10-CM | POA: Diagnosis present

## 2016-03-02 DIAGNOSIS — J42 Unspecified chronic bronchitis: Secondary | ICD-10-CM | POA: Diagnosis not present

## 2016-03-02 DIAGNOSIS — E1165 Type 2 diabetes mellitus with hyperglycemia: Secondary | ICD-10-CM

## 2016-03-02 DIAGNOSIS — E876 Hypokalemia: Secondary | ICD-10-CM | POA: Diagnosis present

## 2016-03-02 DIAGNOSIS — IMO0001 Reserved for inherently not codable concepts without codable children: Secondary | ICD-10-CM | POA: Diagnosis present

## 2016-03-02 LAB — CBC WITH DIFFERENTIAL/PLATELET
Basophils Absolute: 0 10*3/uL (ref 0.0–0.1)
Basophils Relative: 0 %
EOS ABS: 0.1 10*3/uL (ref 0.0–0.7)
Eosinophils Relative: 1 %
HEMATOCRIT: 27.4 % — AB (ref 36.0–46.0)
Hemoglobin: 8 g/dL — ABNORMAL LOW (ref 12.0–15.0)
LYMPHS ABS: 0.6 10*3/uL — AB (ref 0.7–4.0)
Lymphocytes Relative: 5 %
MCH: 22 pg — AB (ref 26.0–34.0)
MCHC: 29.2 g/dL — AB (ref 30.0–36.0)
MCV: 75.5 fL — ABNORMAL LOW (ref 78.0–100.0)
MONOS PCT: 3 %
Monocytes Absolute: 0.3 10*3/uL (ref 0.1–1.0)
NEUTROS ABS: 10 10*3/uL — AB (ref 1.7–7.7)
NEUTROS PCT: 91 %
Platelets: 446 10*3/uL — ABNORMAL HIGH (ref 150–400)
RBC: 3.63 MIL/uL — ABNORMAL LOW (ref 3.87–5.11)
RDW: 17.6 % — ABNORMAL HIGH (ref 11.5–15.5)
WBC: 11 10*3/uL — ABNORMAL HIGH (ref 4.0–10.5)

## 2016-03-02 LAB — GLUCOSE, CAPILLARY
GLUCOSE-CAPILLARY: 277 mg/dL — AB (ref 65–99)
Glucose-Capillary: 320 mg/dL — ABNORMAL HIGH (ref 65–99)
Glucose-Capillary: 231 mg/dL — ABNORMAL HIGH (ref 65–99)
Glucose-Capillary: 232 mg/dL — ABNORMAL HIGH (ref 65–99)

## 2016-03-02 LAB — URINALYSIS, ROUTINE W REFLEX MICROSCOPIC
Bilirubin Urine: NEGATIVE
Glucose, UA: NEGATIVE mg/dL
Hgb urine dipstick: NEGATIVE
Ketones, ur: NEGATIVE mg/dL
Leukocytes, UA: NEGATIVE
Nitrite: NEGATIVE
Protein, ur: NEGATIVE mg/dL
Specific Gravity, Urine: 1.008 (ref 1.005–1.030)
pH: 6.5 (ref 5.0–8.0)

## 2016-03-02 LAB — I-STAT ARTERIAL BLOOD GAS, ED
ACID-BASE EXCESS: 8 mmol/L — AB (ref 0.0–2.0)
BICARBONATE: 31.6 meq/L — AB (ref 20.0–24.0)
O2 Saturation: 98 %
PO2 ART: 95 mmHg (ref 80.0–100.0)
TCO2: 33 mmol/L (ref 0–100)
pCO2 arterial: 40 mmHg (ref 35.0–45.0)
pH, Arterial: 7.506 — ABNORMAL HIGH (ref 7.350–7.450)

## 2016-03-02 LAB — TROPONIN I
Troponin I: <0.03 ng/mL (ref <0.031)
Troponin I: <0.03 ng/mL (ref <0.031)

## 2016-03-02 LAB — COMPREHENSIVE METABOLIC PANEL
ALK PHOS: 87 U/L (ref 38–126)
ALT: 7 U/L — ABNORMAL LOW (ref 14–54)
ANION GAP: 10 (ref 5–15)
AST: 14 U/L — ABNORMAL LOW (ref 15–41)
Albumin: 3.1 g/dL — ABNORMAL LOW (ref 3.5–5.0)
BILIRUBIN TOTAL: 0.9 mg/dL (ref 0.3–1.2)
BUN: 14 mg/dL (ref 6–20)
CALCIUM: 9.1 mg/dL (ref 8.9–10.3)
CO2: 31 mmol/L (ref 22–32)
Chloride: 97 mmol/L — ABNORMAL LOW (ref 101–111)
Creatinine, Ser: 0.78 mg/dL (ref 0.44–1.00)
Glucose, Bld: 123 mg/dL — ABNORMAL HIGH (ref 65–99)
Potassium: 3.2 mmol/L — ABNORMAL LOW (ref 3.5–5.1)
Sodium: 138 mmol/L (ref 135–145)
TOTAL PROTEIN: 6.4 g/dL — AB (ref 6.5–8.1)

## 2016-03-02 LAB — BRAIN NATRIURETIC PEPTIDE: B NATRIURETIC PEPTIDE 5: 508.6 pg/mL — AB (ref 0.0–100.0)

## 2016-03-02 LAB — I-STAT TROPONIN, ED: Troponin i, poc: 0 ng/mL (ref 0.00–0.08)

## 2016-03-02 LAB — HEMOGLOBIN AND HEMATOCRIT, BLOOD
HCT: 29.3 % — ABNORMAL LOW (ref 36.0–46.0)
HEMOGLOBIN: 8.6 g/dL — AB (ref 12.0–15.0)

## 2016-03-02 LAB — I-STAT CG4 LACTIC ACID, ED: Lactic Acid, Venous: 1.42 mmol/L (ref 0.5–2.0)

## 2016-03-02 MED ORDER — OXYCODONE-ACETAMINOPHEN 5-325 MG PO TABS
1.0000 | ORAL_TABLET | ORAL | Status: DC | PRN
  Administered 2016-03-02 – 2016-03-07 (×18): 2 via ORAL
  Administered 2016-03-07 – 2016-03-08 (×5): 1 via ORAL
  Filled 2016-03-02: qty 2
  Filled 2016-03-02: qty 1
  Filled 2016-03-02 (×4): qty 2
  Filled 2016-03-02 (×2): qty 1
  Filled 2016-03-02 (×16): qty 2

## 2016-03-02 MED ORDER — LEVOTHYROXINE SODIUM 25 MCG PO TABS
125.0000 ug | ORAL_TABLET | Freq: Every day | ORAL | Status: DC
  Administered 2016-03-04 – 2016-03-08 (×5): 125 ug via ORAL
  Filled 2016-03-02 (×5): qty 1

## 2016-03-02 MED ORDER — LORATADINE 10 MG PO TABS
10.0000 mg | ORAL_TABLET | Freq: Every day | ORAL | Status: DC
  Administered 2016-03-02 – 2016-03-08 (×7): 10 mg via ORAL
  Filled 2016-03-02 (×7): qty 1

## 2016-03-02 MED ORDER — SODIUM CHLORIDE 0.9% FLUSH
3.0000 mL | Freq: Two times a day (BID) | INTRAVENOUS | Status: DC
  Administered 2016-03-02 – 2016-03-08 (×11): 3 mL via INTRAVENOUS

## 2016-03-02 MED ORDER — ACETAMINOPHEN 325 MG PO TABS
650.0000 mg | ORAL_TABLET | ORAL | Status: DC | PRN
  Administered 2016-03-02 – 2016-03-07 (×8): 650 mg via ORAL
  Filled 2016-03-02 (×9): qty 2

## 2016-03-02 MED ORDER — IPRATROPIUM BROMIDE 0.02 % IN SOLN
0.5000 mg | Freq: Once | RESPIRATORY_TRACT | Status: AC
  Administered 2016-03-02: 0.5 mg via RESPIRATORY_TRACT
  Filled 2016-03-02: qty 2.5

## 2016-03-02 MED ORDER — SODIUM CHLORIDE 0.9% FLUSH: 3.0000 mL | INTRAVENOUS | Status: DC | PRN

## 2016-03-02 MED ORDER — METOPROLOL TARTRATE 12.5 MG HALF TABLET
12.5000 mg | ORAL_TABLET | Freq: Two times a day (BID) | ORAL | Status: DC
  Administered 2016-03-02 – 2016-03-08 (×13): 12.5 mg via ORAL
  Filled 2016-03-02 (×13): qty 1

## 2016-03-02 MED ORDER — ALBUTEROL SULFATE (2.5 MG/3ML) 0.083% IN NEBU
5.0000 mg | INHALATION_SOLUTION | Freq: Once | RESPIRATORY_TRACT | Status: AC
  Administered 2016-03-02: 5 mg via RESPIRATORY_TRACT
  Filled 2016-03-02: qty 6

## 2016-03-02 MED ORDER — ASPIRIN EC 81 MG PO TBEC
81.0000 mg | DELAYED_RELEASE_TABLET | Freq: Every day | ORAL | Status: DC
  Administered 2016-03-02 – 2016-03-08 (×7): 81 mg via ORAL
  Filled 2016-03-02 (×7): qty 1

## 2016-03-02 MED ORDER — NITROGLYCERIN 2 % TD OINT
1.0000 [in_us] | TOPICAL_OINTMENT | Freq: Once | TRANSDERMAL | Status: AC
  Administered 2016-03-02: 1 [in_us] via TOPICAL
  Filled 2016-03-02: qty 1

## 2016-03-02 MED ORDER — DIVALPROEX SODIUM 250 MG PO DR TAB
250.0000 mg | DELAYED_RELEASE_TABLET | Freq: Two times a day (BID) | ORAL | Status: DC
  Administered 2016-03-02 – 2016-03-08 (×13): 250 mg via ORAL
  Filled 2016-03-02 (×13): qty 1

## 2016-03-02 MED ORDER — SODIUM CHLORIDE 0.9 % IV SOLN: 250.0000 mL | INTRAVENOUS | Status: DC | PRN

## 2016-03-02 MED ORDER — IPRATROPIUM-ALBUTEROL 0.5-2.5 (3) MG/3ML IN SOLN
3.0000 mL | Freq: Four times a day (QID) | RESPIRATORY_TRACT | Status: DC
  Administered 2016-03-02 – 2016-03-03 (×6): 3 mL via RESPIRATORY_TRACT
  Filled 2016-03-02: qty 6
  Filled 2016-03-02 (×5): qty 3

## 2016-03-02 MED ORDER — IPRATROPIUM-ALBUTEROL 0.5-2.5 (3) MG/3ML IN SOLN
3.0000 mL | RESPIRATORY_TRACT | Status: DC | PRN
  Administered 2016-03-04 – 2016-03-08 (×8): 3 mL via RESPIRATORY_TRACT
  Filled 2016-03-02 (×8): qty 3

## 2016-03-02 MED ORDER — ONDANSETRON HCL 4 MG/2ML IJ SOLN
4.0000 mg | Freq: Four times a day (QID) | INTRAMUSCULAR | Status: DC | PRN
  Administered 2016-03-07: 4 mg via INTRAVENOUS
  Filled 2016-03-02: qty 2

## 2016-03-02 MED ORDER — INSULIN GLARGINE 100 UNIT/ML ~~LOC~~ SOLN
45.0000 [IU] | Freq: Every day | SUBCUTANEOUS | Status: DC
  Administered 2016-03-02 – 2016-03-04 (×3): 45 [IU] via SUBCUTANEOUS
  Filled 2016-03-02 (×4): qty 0.45

## 2016-03-02 MED ORDER — NITROGLYCERIN 2 % TD OINT: 1.0000 [in_us] | TOPICAL_OINTMENT | Freq: Four times a day (QID) | TRANSDERMAL | Status: DC

## 2016-03-02 MED ORDER — FUROSEMIDE 10 MG/ML IJ SOLN
60.0000 mg | Freq: Once | INTRAMUSCULAR | Status: AC
  Administered 2016-03-02: 60 mg via INTRAVENOUS
  Filled 2016-03-02: qty 6

## 2016-03-02 MED ORDER — PANTOPRAZOLE SODIUM 40 MG PO TBEC
40.0000 mg | DELAYED_RELEASE_TABLET | Freq: Every day | ORAL | Status: DC
  Administered 2016-03-02 – 2016-03-08 (×7): 40 mg via ORAL
  Filled 2016-03-02 (×7): qty 1

## 2016-03-02 MED ORDER — ARIPIPRAZOLE 5 MG PO TABS
5.0000 mg | ORAL_TABLET | Freq: Every day | ORAL | Status: DC
  Administered 2016-03-02 – 2016-03-08 (×7): 5 mg via ORAL
  Filled 2016-03-02 (×7): qty 1

## 2016-03-02 MED ORDER — RACEPINEPHRINE HCL 2.25 % IN NEBU
0.5000 mL | INHALATION_SOLUTION | Freq: Once | RESPIRATORY_TRACT | Status: AC
  Administered 2016-03-02: 0.5 mL via RESPIRATORY_TRACT

## 2016-03-02 MED ORDER — LOSARTAN POTASSIUM 50 MG PO TABS
100.0000 mg | ORAL_TABLET | Freq: Every day | ORAL | Status: DC
  Administered 2016-03-02 – 2016-03-06 (×5): 100 mg via ORAL
  Filled 2016-03-02 (×5): qty 2

## 2016-03-02 MED ORDER — ALPRAZOLAM 0.25 MG PO TABS
0.2500 mg | ORAL_TABLET | Freq: Two times a day (BID) | ORAL | Status: DC | PRN
  Administered 2016-03-02 – 2016-03-08 (×14): 0.25 mg via ORAL
  Filled 2016-03-02 (×15): qty 1

## 2016-03-02 MED ORDER — ALBUTEROL SULFATE HFA 108 (90 BASE) MCG/ACT IN AERS: 2.0000 | INHALATION_SPRAY | Freq: Four times a day (QID) | RESPIRATORY_TRACT | Status: DC | PRN

## 2016-03-02 MED ORDER — SIMVASTATIN 10 MG PO TABS
10.0000 mg | ORAL_TABLET | Freq: Every day | ORAL | Status: DC
  Administered 2016-03-02 – 2016-03-08 (×7): 10 mg via ORAL
  Filled 2016-03-02 (×7): qty 1

## 2016-03-02 MED ORDER — FUROSEMIDE 10 MG/ML IJ SOLN
40.0000 mg | Freq: Two times a day (BID) | INTRAMUSCULAR | Status: DC
  Administered 2016-03-02 – 2016-03-06 (×8): 40 mg via INTRAVENOUS
  Filled 2016-03-02 (×8): qty 4

## 2016-03-02 MED ORDER — METHOCARBAMOL 500 MG PO TABS
500.0000 mg | ORAL_TABLET | Freq: Four times a day (QID) | ORAL | Status: DC | PRN
  Administered 2016-03-02 – 2016-03-07 (×4): 500 mg via ORAL
  Filled 2016-03-02 (×4): qty 1

## 2016-03-02 MED ORDER — POTASSIUM CHLORIDE CRYS ER 20 MEQ PO TBCR
20.0000 meq | EXTENDED_RELEASE_TABLET | Freq: Every day | ORAL | Status: DC
  Administered 2016-03-02 – 2016-03-08 (×7): 20 meq via ORAL
  Filled 2016-03-02: qty 2
  Filled 2016-03-02 (×6): qty 1

## 2016-03-02 MED ORDER — INSULIN ASPART 100 UNIT/ML ~~LOC~~ SOLN
0.0000 [IU] | Freq: Three times a day (TID) | SUBCUTANEOUS | Status: DC
  Administered 2016-03-02: 15 [IU] via SUBCUTANEOUS
  Administered 2016-03-02: 7 [IU] via SUBCUTANEOUS
  Administered 2016-03-02: 11 [IU] via SUBCUTANEOUS
  Administered 2016-03-03: 7 [IU] via SUBCUTANEOUS
  Administered 2016-03-03 – 2016-03-04 (×2): 4 [IU] via SUBCUTANEOUS
  Administered 2016-03-04: 7 [IU] via SUBCUTANEOUS
  Administered 2016-03-05: 3 [IU] via SUBCUTANEOUS
  Administered 2016-03-05: 4 [IU] via SUBCUTANEOUS
  Administered 2016-03-06: 7 [IU] via SUBCUTANEOUS
  Administered 2016-03-06: 20 [IU] via SUBCUTANEOUS
  Administered 2016-03-06 – 2016-03-07 (×2): 7 [IU] via SUBCUTANEOUS
  Administered 2016-03-07 – 2016-03-08 (×3): 15 [IU] via SUBCUTANEOUS
  Administered 2016-03-08: 4 [IU] via SUBCUTANEOUS

## 2016-03-02 MED ORDER — HYDROCODONE-ACETAMINOPHEN 5-325 MG PO TABS
1.0000 | ORAL_TABLET | Freq: Once | ORAL | Status: AC
  Administered 2016-03-02: 1 via ORAL
  Filled 2016-03-02: qty 1

## 2016-03-02 MED ORDER — ENOXAPARIN SODIUM 40 MG/0.4ML ~~LOC~~ SOLN
40.0000 mg | SUBCUTANEOUS | Status: DC
  Administered 2016-03-04 – 2016-03-08 (×5): 40 mg via SUBCUTANEOUS
  Filled 2016-03-02 (×5): qty 0.4

## 2016-03-02 MED ORDER — FUROSEMIDE 10 MG/ML IJ SOLN
80.0000 mg | Freq: Once | INTRAMUSCULAR | Status: AC
  Administered 2016-03-02: 80 mg via INTRAVENOUS
  Filled 2016-03-02: qty 8

## 2016-03-02 NOTE — Progress Notes (Signed)
Pt admitted from ER with o2 4lnc. Pt resting in bed comfortable vital signs charted recorded. CMT notified. NSR noted. No acute distress noted. Report given to 7a-7p shift,.

## 2016-03-02 NOTE — ED Notes (Signed)
Per EMS, pt c/o shortness of breath x 4 days. Pt on home O2 at 2L. Pt has audible wheezing, given 5 albuterol and 0.5 atrovent, 125 mg solumedrol PTA. BP- 130/74, CBG 106, SpO2 100% 4L

## 2016-03-02 NOTE — Progress Notes (Signed)
Pt admitted after midnight, please see earlier admission note by Dr. Melynda RippleHobbs. Pt admitted for evaluation of dyspnea and chest pain, admitted to telemetry unit. Pt seen and examined at bedside, still dyspneic but reports feeling better this AM. Will continue lasix, BD's scheduled and as needed. Blood work in AM. If not better, consider cardiology consult.   Debbora PrestoMAGICK-Kayvan Hoefling, MD  [facility] Pager [phone]  If 7PM-7AM, please contact night-coverage [email or web address] Password [email or web address]
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
## 2016-03-02 NOTE — Progress Notes (Signed)
RT placed patient on BIPAP per MD order. Patient placed on 12/6 and 50%. Patient not tolerating well. Patient has removed mask and she states she cannot wear mask. RN notified.
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
## 2016-03-03 LAB — CBC
HEMATOCRIT: 23.9 % — AB (ref 36.0–46.0)
HEMOGLOBIN: 7 g/dL — AB (ref 12.0–15.0)
MCH: 21.7 pg — AB (ref 26.0–34.0)
MCHC: 29.3 g/dL — AB (ref 30.0–36.0)
MCV: 74 fL — ABNORMAL LOW (ref 78.0–100.0)
Platelets: 390 10*3/uL (ref 150–400)
RBC: 3.23 MIL/uL — AB (ref 3.87–5.11)
RDW: 17.7 % — ABNORMAL HIGH (ref 11.5–15.5)
WBC: 10.4 10*3/uL (ref 4.0–10.5)

## 2016-03-03 LAB — BASIC METABOLIC PANEL
ANION GAP: 9 (ref 5–15)
BUN: 31 mg/dL — ABNORMAL HIGH (ref 6–20)
CALCIUM: 9.1 mg/dL (ref 8.9–10.3)
CO2: 33 mmol/L — AB (ref 22–32)
Chloride: 96 mmol/L — ABNORMAL LOW (ref 101–111)
Creatinine, Ser: 0.93 mg/dL (ref 0.44–1.00)
GFR calc non Af Amer: >60 mL/min (ref >60)
GLUCOSE: 225 mg/dL — AB (ref 65–99)
POTASSIUM: 3.6 mmol/L (ref 3.5–5.1)
Sodium: 138 mmol/L (ref 135–145)

## 2016-03-03 LAB — GLUCOSE, CAPILLARY
GLUCOSE-CAPILLARY: 206 mg/dL — AB (ref 65–99)
GLUCOSE-CAPILLARY: 75 mg/dL (ref 65–99)
GLUCOSE-CAPILLARY: 156 mg/dL — AB (ref 65–99)
GLUCOSE-CAPILLARY: 162 mg/dL — AB (ref 65–99)

## 2016-03-03 LAB — TROPONIN I: Troponin I: <0.03 ng/mL (ref <0.031)

## 2016-03-03 MED ORDER — IPRATROPIUM-ALBUTEROL 0.5-2.5 (3) MG/3ML IN SOLN
3.0000 mL | Freq: Three times a day (TID) | RESPIRATORY_TRACT | Status: DC
  Administered 2016-03-04 – 2016-03-06 (×6): 3 mL via RESPIRATORY_TRACT
  Filled 2016-03-03 (×6): qty 3

## 2016-03-03 MED ORDER — GUAIFENESIN-DM 100-10 MG/5ML PO SYRP
5.0000 mL | ORAL_SOLUTION | ORAL | Status: DC | PRN
  Administered 2016-03-07: 5 mL via ORAL
  Filled 2016-03-03: qty 5

## 2016-03-03 NOTE — Progress Notes (Signed)
Patient ID: [name], female   DOB: 03-01-1961, 55 y.o.   MRN: [id number]    PROGRESS NOTE    LANDREE FERNHOLZ  WUJ:[id number] DOB: Mar 21, 1961 DOA: 03/02/2016  PCP: [name], MD   Brief Narrative:  55 y.o. female with hypertension, anemia, depression, past heart attack, diabetes, COPD on chronic home O2 3 L via [place], presented to [facility] ED with main concern of several days duration of progressive dyspnea with exertion and occasionally present at rest. Initial exam consistent with acute CHF exacerbation and pt admitted for further management.   Assessment & Plan:   Principal Problem: Acute on chronic respiratory failure - multifactorial and secondary to acute on chronic diastolic CHF grade II and acute COPD exacerbation - pt responding well to Lasix 40 mg IV BID, weight is slightly down from 189 --> 188 lbs this AM - pt also on BD's scheduled and as needed, no need for steroids as currently wheezing is rather minimal and responds well to BD's - will continue to monitor clinical response - daily weights, strict I/O, no need to repeat ECHO  - may need to call cardiology but OK for now  - ? Aspiration PNA on CT chest, SLP requested, as noted above no fevers and no leukocytosis, holding ABX - aspiration precautions   Active Problems:   Diabetes mellitus, insulin dependent (IDDM), uncontrolled (HCC) - continue long acting insulin as well as SSI     Acute COPD (chronic obstructive pulmonary disease) (HCC) - management with bronchodilators as noted above - no fever and no leukocytosis, so OK to hold of on empiric ABX  - allow mucinex and robitussin as needed     Hypothyroidism - continue synthroid     Bipolar I disorder (HCC) - stable     Hyperlipidemia - continue statin     Hypokalemia - due to lasix - resolved - BMP in AM    Anemia of iron deficiency - no signs of active bleeding - pt reluctant to have blood transfusion - will repeat Hg in AM and if < 7, plan on  transfusion unit of blood  - CBC In AM    Morbid obesity due to excess calories  - Body mass index is 40.68 kg/(m^2).   DVT prophylaxis: Lovenox SQ Code Status: Full  Family Communication: Patient at bedside  Disposition Plan: PT eval pending   Consultants:   None  Procedures:   None  Antimicrobials:   None   Subjective: Reports feeling better.   Objective: Filed Vitals:   03/03/16 0731 03/03/16 1001 03/03/16 1027 03/03/16 1219  BP:   103/44 118/53  Pulse:   75 89  Temp:    98.4 F (36.9 C)  TempSrc:    Oral  Resp:    20  Weight: 85.3 kg (188 lb 0.8 oz)     SpO2:  99%  100%    Intake/Output Summary (Last 24 hours) at 03/03/16 1432 Last data filed at 03/03/16 1357  Gross per 24 hour  Intake    723 ml  Output   1550 ml  Net   -827 ml   Filed Weights   03/03/16 0607 03/03/16 0731  Weight: 86.047 kg (189 lb 11.2 oz) 85.3 kg (188 lb 0.8 oz)    Examination:  General exam: Appears calm and comfortable  Respiratory system: diminished breath sounds at bases with minimal wheezing  Cardiovascular system: S1 & S2 heard, RRR. No JVD, rubs, gallops or clicks. +1 bilateral LE edema  Gastrointestinal system: Abdomen is nondistended, soft and nontender. Central nervous system: Alert and oriented. No focal neurological deficits. Psychiatry: Judgement and insight appear normal. Mood & affect appropriate.   Data Reviewed: I have personally reviewed following labs and imaging studies  CBC:  Recent Labs Lab 03/02/16 0211 03/02/16 1123 03/03/16 0214  WBC 11.0*  --  10.4  NEUTROABS 10.0*  --   --   HGB 8.0* 8.6* 7.0*  HCT 27.4* 29.3* 23.9*  MCV 75.5*  --  74.0*  PLT 446*  --  390   Basic Metabolic Panel:  Recent Labs Lab 03/02/16 0211 03/03/16 0214  NA 138 138  K 3.2* 3.6  CL 97* 96*  CO2 31 33*  GLUCOSE 123* 225*  BUN 14 31*  CREATININE 0.78 0.93  CALCIUM 9.1 9.1   Liver Function Tests:  Recent Labs Lab 03/02/16 0211  AST 14*  ALT 7*    ALKPHOS 87  BILITOT 0.9  PROT 6.4*  ALBUMIN 3.1*   Cardiac Enzymes:  Recent Labs Lab 03/02/16 1500 03/02/16 2136 03/03/16 0214  TROPONINI <0.03 <0.03 <0.03   CBG:  Recent Labs Lab 03/02/16 1155 03/02/16 1639 03/02/16 2144 03/03/16 0607 03/03/16 1135  GLUCAP 277* 231* 232* 206* 75   Urine analysis:    Component Value Date/Time   COLORURINE YELLOW 03/02/2016 0914   APPEARANCEUR CLEAR 03/02/2016 0914   LABSPEC 1.008 03/02/2016 0914   PHURINE 6.5 03/02/2016 0914   GLUCOSEU NEGATIVE 03/02/2016 0914   HGBUR NEGATIVE 03/02/2016 0914   BILIRUBINUR NEGATIVE 03/02/2016 0914   KETONESUR NEGATIVE 03/02/2016 0914   PROTEINUR NEGATIVE 03/02/2016 0914   UROBILINOGEN 0.2 07/29/2015 1948   NITRITE NEGATIVE 03/02/2016 0914   LEUKOCYTESUR NEGATIVE 03/02/2016 0914   Radiology Studies: Dg Neck Soft Tissue 03/02/2016  Negative.   Ct Chest Wo Contrast 03/02/2016  High-grade stenosis of the cervical trachea, probably due to scarring from prior endotracheal intubation. Mild scattered secretions within the thoracic tracheal lumen. 2. Patchy dependent areas of consolidation in both lower lobes with associated mild volume loss. Although likely partially due to atelectasis, a component of aspiration pneumonia is suspected. 3. Stable mild cardiomegaly. Diffuse ground-glass opacity throughout both lungs, suspect mild pulmonary edema. 4. Mosaic attenuation in the lungs, most commonly due to air trapping from small airways disease. 5. Two vessel coronary atherosclerosis. 6. Stable mild right paratracheal mediastinal lymphadenopathy, probably reactive. 7. Trace bilateral pleural effusions.   Dg Chest Port 1 View 03/02/2016 Vascular congestion and mild cardiomegaly. Increased interstitial markings raise concern for pulmonary edema.   Scheduled Meds: . ARIPiprazole  5 mg Oral Daily  . aspirin EC  81 mg Oral Daily  . divalproex  250 mg Oral Q12H  . enoxaparin (LOVENOX) injection  40 mg Subcutaneous  Q24H  . furosemide  40 mg Intravenous BID  . insulin aspart  0-20 Units Subcutaneous TID WC  . insulin glargine  45 Units Subcutaneous QHS  . ipratropium-albuterol  3 mL Nebulization Q6H  . levothyroxine  125 mcg Oral QAC breakfast  . loratadine  10 mg Oral Daily  . losartan  100 mg Oral Daily  . metoprolol tartrate  12.5 mg Oral BID  . pantoprazole  40 mg Oral Daily  . potassium chloride SA  20 mEq Oral Daily  . simvastatin  10 mg Oral Daily  . sodium chloride flush  3 mL Intravenous Q12H   Continuous Infusions:    LOS: 1 day    Time spent: 20 minutes    MAGICK-Deneka Greenwalt, Sherlon HandingISKRA, MD  [facility] Pager [phone]  If 7PM-7AM, please contact night-coverage [email or web address] Password [facility] 03/03/2016, 2:32 PM

## 2016-03-03 NOTE — Evaluation (Signed)
Physical Therapy Evaluation Patient Details Name: [name] MRN: [id number] DOB: Jul 29, 1961 Today's Date: 03/03/2016   History of Present Illness  Pt is a 55 y/o F who presented w/ worsening SOB. [name] on home O2 3L.  Pt w/ recent hospitalization w/ similar presentation.  Pt's PMH includes anemia, chronic back pain, neuropathy, major depressive disorder, MI, panic disorder w/ agoraphobia, bipolar disorder, Rt humeral fx, Lt patella fx, foot surgery, back surgery, ORIF Lt ankle.    Clinical Impression  Pt admitted with above diagnosis. Pt currently with functional limitations due to the deficits listed below (see PT Problem List). PTA pt ambulating short household distances and receives assist from husband and aide for bathing, dressing.  [name] presents w/ SOB and anxiety w/ short distance ambulation, SpO2 down as low as 79% w/ ~1 minute resting before up to high 90s. Pt will benefit from skilled PT to increase their independence and safety with mobility to allow discharge to the venue listed below.      Follow Up Recommendations Home health PT;Supervision for mobility/OOB    Equipment Recommendations  None recommended by PT    Recommendations for Other Services OT consult     Precautions / Restrictions Precautions Precautions: Fall Precaution Comments: monitor O2 Restrictions Weight Bearing Restrictions: No      Mobility  Bed Mobility Overal bed mobility: Needs Assistance Bed Mobility: Supine to Sit;Sit to Supine     Supine to sit: Min guard;HOB elevated Sit to supine: Min guard   General bed mobility comments: HOB elevated and pt uses bed rail for supine>sit.  No physical assist needed.  Transfers Overall transfer level: Needs assistance Equipment used: Rolling walker (2 wheeled) Transfers: Sit to/from Stand Sit to Stand: Min assist         General transfer comment: Assist to boost from bed, min guard to stand from recliner chair.  Cues for technique and  hand placement when using RW.  Poorly controlled descent when sitting.  Ambulation/Gait Ambulation/Gait assistance: Min guard Ambulation Distance (Feet): 20 Feet Assistive device: Rolling walker (2 wheeled) Gait Pattern/deviations: Trunk flexed;Step-through pattern;Decreased stride length   Gait velocity interpretation: Below normal speed for age/gender General Gait Details: Flexed posture which pt reports is her baseline due to her back pain.  Pt declines ambulating in hall due to back pain worsening w/ ambulation and moves quickly to chair to sit.  SpO2 down as low as 79% requiring ~1 minutes of pursed lip breathing before recovering to high 90s.  Stairs            Wheelchair Mobility    Modified Rankin (Stroke Patients Only)       Balance Overall balance assessment: Needs assistance Sitting-balance support: No upper extremity supported;Feet supported Sitting balance-Leahy Scale: Fair Sitting balance - Comments: Min guard for safety   Standing balance support: Bilateral upper extremity supported;During functional activity Standing balance-Leahy Scale: Poor Standing balance comment: Relies on UE support                             Pertinent Vitals/Pain Pain Assessment: 0-10 Pain Score: 7  Pain Location: headache; back Pain Descriptors / Indicators: Aching;Headache Pain Intervention(s): Limited activity within patient's tolerance;Monitored during session    Home Living Family/patient expects to be discharged to:: Private residence Living Arrangements: Spouse/significant other;Children Available Help at Discharge: Family;Available 24 hours/day Type of Home: Apartment Home Access: Level entry     Home Layout: One level  Home Equipment: Walker - 2 wheels;Cane - single point;Shower seat;Grab bars - tub/shower;Hand held shower head;Wheelchair - Careers advisermanual;Other (comment);Bedside commode;Grab bars - toilet (hemiwalker) Additional Comments: Reports pt's husband  has "issues w/ his feet" but is able to assist pt physically if needed.  "He waits on me all the time"    Prior Function Level of Independence: Needs assistance   Gait / Transfers Assistance Needed: Pt was ambulating short distances in home.  Uses hemiwalker in bedroom and RW in other rooms.  Hus[facility]  ADL's / Homemaking Assistance Needed: has HHA that assists with bathing/dressing, laundry and meals 3x/week for 3-4 hours.         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  Scar Lt knee.  Strength grossly 4/5  Cervical / Trunk Assessment: Other exceptions  Communication   Communication: No difficulties  Cognition Arousal/Alertness: Awake/alert Behavior During Therapy: Anxious Overall Cognitive Status: Within Functional Limits for tasks assessed                      General Comments General comments (skin integrity, edema, etc.): Pt declines sitting in the chair reporting it causes her back pain to worsen and becomes very anxious requesting to return to bed.    Exercises General Exercises - Lower Extremity Ankle Circles/Pumps: AROM;Both;10 reps;Supine Straight Leg Raises: AROM;Both;5 reps;Supine      Assessment/Plan    PT Assessment Patient needs continued PT services  PT Diagnosis Difficulty walking;Acute pain   PT Problem List Decreased strength;Decreased range of motion;Decreased  activity tolerance;Decreased balance;Decreased knowledge of use of DME;Decreased safety awareness;Cardiopulmonary status limiting activity;Pain  PT Treatment Interventions DME instruction;Gait training;Functional mobility training;Therapeutic activities;Therapeutic exercise;Balance training;Patient/family education;Modalities   PT Goals (Current goals can be found in the Care Plan section) Acute Rehab PT Goals Patient Stated Goal: to feel better and to get stronger PT Goal Formulation: With patient Time For Goal Achievement: 03/17/16 Potential to Achieve Goals: Good    Frequency Min 3X/week   Barriers to discharge        Co-evaluation               End of Session Equipment Utilized During Treatment: Gait belt;Oxygen Activity Tolerance: Treatment limited secondary to medical complications (Comment);Patient limited by pain (hypoxia) Patient left: in bed;with call bell/phone within reach;with bed alarm set Nurse Communication: Mobility status         Time: [id number] PT Time Calculation (min) (ACUTE ONLY): 26 min   Charges:   PT Evaluation $PT Eval Low Complexity: 1 Procedure PT Treatments $Therapeutic Activity: 8-22 mins   PT G Codes:       Encarnacion ChuAshley Abashian PT, DPT  Pager: [phone] Phone: [phone] 03/03/2016, 10:30 AM

## 2016-03-04 DIAGNOSIS — J438 Other emphysema: Secondary | ICD-10-CM

## 2016-03-04 DIAGNOSIS — F319 Bipolar disorder, unspecified: Secondary | ICD-10-CM

## 2016-03-04 LAB — BASIC METABOLIC PANEL
Anion gap: 9 (ref 5–15)
BUN: 25 mg/dL — ABNORMAL HIGH (ref 6–20)
CO2: 35 mmol/L — ABNORMAL HIGH (ref 22–32)
Calcium: 8.9 mg/dL (ref 8.9–10.3)
Chloride: 96 mmol/L — ABNORMAL LOW (ref 101–111)
Creatinine, Ser: 0.8 mg/dL (ref 0.44–1.00)
GFR calc Af Amer: >60 mL/min (ref >60)
GFR calc non Af Amer: >60 mL/min (ref >60)
Glucose, Bld: 81 mg/dL (ref 65–99)
Potassium: 3.5 mmol/L (ref 3.5–5.1)
Sodium: 140 mmol/L (ref 135–145)

## 2016-03-04 LAB — GLUCOSE, CAPILLARY
GLUCOSE-CAPILLARY: 77 mg/dL (ref 65–99)
GLUCOSE-CAPILLARY: 140 mg/dL — AB (ref 65–99)
Glucose-Capillary: 81 mg/dL (ref 65–99)
Glucose-Capillary: 163 mg/dL — ABNORMAL HIGH (ref 65–99)
Glucose-Capillary: 165 mg/dL — ABNORMAL HIGH (ref 65–99)
Glucose-Capillary: 211 mg/dL — ABNORMAL HIGH (ref 65–99)
Glucose-Capillary: 216 mg/dL — ABNORMAL HIGH (ref 65–99)

## 2016-03-04 LAB — CBC
HCT: 26.9 % — ABNORMAL LOW (ref 36.0–46.0)
Hemoglobin: 7.7 g/dL — ABNORMAL LOW (ref 12.0–15.0)
MCH: 21.8 pg — ABNORMAL LOW (ref 26.0–34.0)
MCHC: 28.6 g/dL — ABNORMAL LOW (ref 30.0–36.0)
MCV: 76.2 fL — ABNORMAL LOW (ref 78.0–100.0)
Platelets: 378 10*3/uL (ref 150–400)
RBC: 3.53 MIL/uL — ABNORMAL LOW (ref 3.87–5.11)
RDW: 17.7 % — ABNORMAL HIGH (ref 11.5–15.5)
WBC: 9.6 10*3/uL (ref 4.0–10.5)

## 2016-03-04 NOTE — Care Management Note (Signed)
Case Management Note  Patient Details  Name: [name] MRN: [id number] Date of Birth: [birth year]  Subjective/Objective:       Admitted with Acute CHF             Action/Plan: Patient lives at home with spouse and daughter; Has private insurance with Medicaid with prescription drug coverage; pharmacy of choice [facility]; [facility]; patient doe not qualify for HHPT, under Medicaid the patient must have a qualifying diagnosis for home health PT; DME- pt has a hospital bed, walker, bedside commode and home oxygen; She also have a personal care service that provides a nursing assistant 7 days a week for 2-3 hrs to assist in her care;   Expected Discharge Date:      Possibly 03/05/2016            Expected Discharge Plan:  Home w Home Health Services  Discharge planning Services  CM Consult  Choice offered to:  Patient  HH Arranged:  RN, Disease Management HH Agency:  [facility]  Status of Service:  In process, will continue to follow  Reola MosherChandler, Joss Friedel L, RN,MHA,BSN [phone] 03/04/2016, 11:45 AM
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
## 2016-03-04 NOTE — Progress Notes (Signed)
Patient ID: [name], female   DOB: May 01, 1961, 55 y.o.   MRN: [id number]    PROGRESS NOTE    TONJIA PARILLO  OZH:[id number] DOB: 05-31-1961 DOA: 03/02/2016  PCP: [name], MD   Brief Narrative:  55 y.o. female with hypertension, anemia, depression, past heart attack, diabetes, COPD on chronic home O2 3 L via [place], presented to [facility] ED with main concern of several days duration of progressive dyspnea with exertion and occasionally present at rest. Initial exam consistent with acute CHF exacerbation and pt admitted for further management.   Assessment & Plan:   Principal Problem: Acute on chronic respiratory failure - multifactorial and secondary to acute on chronic diastolic CHF grade II and acute COPD exacerbation - pt responding well to Lasix 40 mg IV BID, weight is slightly down from 189 --> 188 --> 187 lbs this AM - pt also on BD's scheduled and as needed, no need for steroids as no wheezing and responds well to BD's - will continue to monitor clinical response - daily weights, strict I/O, no need to repeat ECHO  - ? Aspiration PNA on CT chest, SLP requested, appreciate recommendations  Active Problems:   Diabetes mellitus, insulin dependent (IDDM), uncontrolled (HCC) - continue long acting insulin as well as SSI     Acute COPD (chronic obstructive pulmonary disease) (HCC) - management with bronchodilators as noted above - no fever and no leukocytosis, so OK to hold of on empiric ABX  - allow mucinex and robitussin as needed     Hypothyroidism - continue synthroid     Bipolar I disorder (HCC) - stable     Hyperlipidemia - continue statin     Hypokalemia - due to lasix - resolved - BMP in AM    Anemia of iron deficiency - no signs of active bleeding - pt reluctant to have blood transfusion - Hg up since yesterday from 7 --> 7.7 this AM - CBC In AM    Morbid obesity due to excess calories  - Body mass index is 40.68 kg/(m^2).   DVT prophylaxis:  Lovenox SQ Code Status: Full  Family Communication: Patient at bedside  Disposition Plan: Home in AM  Consultants:   None  Procedures:   None  Antimicrobials:   None   Subjective: Reports feeling better.   Objective: Filed Vitals:   03/03/16 2000 03/04/16 0152 03/04/16 0622 03/04/16 1400  BP: 99/46  96/39 99/40  Pulse: 76  66 72  Temp: 98.8 F (37.1 C)  98.5 F (36.9 C) 98.7 F (37.1 C)  TempSrc: Oral  Oral Oral  Resp: 20  18 18   Weight:  85.049 kg (187 lb 8 oz)    SpO2: 100%  100% 100%    Intake/Output Summary (Last 24 hours) at 03/04/16 1646 Last data filed at 03/04/16 1300  Gross per 24 hour  Intake    960 ml  Output   3325 ml  Net  -2365 ml   Filed Weights   03/03/16 0607 03/03/16 0731 03/04/16 0152  Weight: 86.047 kg (189 lb 11.2 oz) 85.3 kg (188 lb 0.8 oz) 85.049 kg (187 lb 8 oz)    Examination:  General exam: Appears calm and comfortable  Respiratory system: diminished breath sounds at bases with mild wheezing  Cardiovascular system: S1 & S2 heard, RRR. No JVD, rubs, gallops or clicks. +1 bilateral LE edema  Gastrointestinal system: Abdomen is nondistended, soft and nontender. Central nervous system: Alert and oriented. No focal neurological deficits.  Psychiatry: Judgement and insight appear normal. Mood & affect appropriate.   Data Reviewed: I have personally reviewed following labs and imaging studies  CBC:  Recent Labs Lab 03/02/16 0211 03/02/16 1123 03/03/16 0214 03/04/16 0311  WBC 11.0*  --  10.4 9.6  NEUTROABS 10.0*  --   --   --   HGB 8.0* 8.6* 7.0* 7.7*  HCT 27.4* 29.3* 23.9* 26.9*  MCV 75.5*  --  74.0* 76.2*  PLT 446*  --  390 378   Basic Metabolic Panel:  Recent Labs Lab 03/02/16 0211 03/03/16 0214 03/04/16 0311  NA 138 138 140  K 3.2* 3.6 3.5  CL 97* 96* 96*  CO2 31 33* 35*  GLUCOSE 123* 225* 81  BUN 14 31* 25*  CREATININE 0.78 0.93 0.80  CALCIUM 9.1 9.1 8.9   Liver Function Tests:  Recent Labs Lab  03/02/16 0211  AST 14*  ALT 7*  ALKPHOS 87  BILITOT 0.9  PROT 6.4*  ALBUMIN 3.1*   Cardiac Enzymes:  Recent Labs Lab 03/02/16 1500 03/02/16 2136 03/03/16 0214  TROPONINI <0.03 <0.03 <0.03   CBG:  Recent Labs Lab 03/03/16 2037 03/04/16 0606 03/04/16 0707 03/04/16 1139 03/04/16 1212  GLUCAP 162* 81 77 165* 163*   Urine analysis:    Component Value Date/Time   COLORURINE YELLOW 03/02/2016 0914   APPEARANCEUR CLEAR 03/02/2016 0914   LABSPEC 1.008 03/02/2016 0914   PHURINE 6.5 03/02/2016 0914   GLUCOSEU NEGATIVE 03/02/2016 0914   HGBUR NEGATIVE 03/02/2016 0914   BILIRUBINUR NEGATIVE 03/02/2016 0914   KETONESUR NEGATIVE 03/02/2016 0914   PROTEINUR NEGATIVE 03/02/2016 0914   UROBILINOGEN 0.2 07/29/2015 1948   NITRITE NEGATIVE 03/02/2016 0914   LEUKOCYTESUR NEGATIVE 03/02/2016 0914   Radiology Studies: Dg Neck Soft Tissue 03/02/2016  Negative.   Ct Chest Wo Contrast 03/02/2016  High-grade stenosis of the cervical trachea, probably due to scarring from prior endotracheal intubation. Mild scattered secretions within the thoracic tracheal lumen. 2. Patchy dependent areas of consolidation in both lower lobes with associated mild volume loss. Although likely partially due to atelectasis, a component of aspiration pneumonia is suspected. 3. Stable mild cardiomegaly. Diffuse ground-glass opacity throughout both lungs, suspect mild pulmonary edema. 4. Mosaic attenuation in the lungs, most commonly due to air trapping from small airways disease. 5. Two vessel coronary atherosclerosis. 6. Stable mild right paratracheal mediastinal lymphadenopathy, probably reactive. 7. Trace bilateral pleural effusions.   Dg Chest Port 1 View 03/02/2016 Vascular congestion and mild cardiomegaly. Increased interstitial markings raise concern for pulmonary edema.   Scheduled Meds: . ARIPiprazole  5 mg Oral Daily  . aspirin EC  81 mg Oral Daily  . divalproex  250 mg Oral Q12H  . enoxaparin  (LOVENOX) injection  40 mg Subcutaneous Q24H  . furosemide  40 mg Intravenous BID  . insulin aspart  0-20 Units Subcutaneous TID WC  . insulin glargine  45 Units Subcutaneous QHS  . ipratropium-albuterol  3 mL Nebulization TID  . levothyroxine  125 mcg Oral QAC breakfast  . loratadine  10 mg Oral Daily  . losartan  100 mg Oral Daily  . metoprolol tartrate  12.5 mg Oral BID  . pantoprazole  40 mg Oral Daily  . potassium chloride SA  20 mEq Oral Daily  . simvastatin  10 mg Oral Daily  . sodium chloride flush  3 mL Intravenous Q12H   Continuous Infusions:    LOS: 2 days    Time spent: 20 minutes    MAGICK-Azaylea Maves,  MD [facility] Pager [phone]  If 7PM-7AM, please contact night-coverage [email or web address] Password [email or web address] 03/04/2016, 4:46 PM

## 2016-03-05 LAB — GLUCOSE, CAPILLARY
GLUCOSE-CAPILLARY: 187 mg/dL — AB (ref 65–99)
Glucose-Capillary: 45 mg/dL — ABNORMAL LOW (ref 65–99)
Glucose-Capillary: 77 mg/dL (ref 65–99)
Glucose-Capillary: 130 mg/dL — ABNORMAL HIGH (ref 65–99)
Glucose-Capillary: 141 mg/dL — ABNORMAL HIGH (ref 65–99)
Glucose-Capillary: 109 mg/dL — ABNORMAL HIGH (ref 65–99)
Glucose-Capillary: 147 mg/dL — ABNORMAL HIGH (ref 65–99)

## 2016-03-05 LAB — BASIC METABOLIC PANEL WITH GFR
Anion gap: 10 (ref 5–15)
BUN: 22 mg/dL — ABNORMAL HIGH (ref 6–20)
CO2: 37 mmol/L — ABNORMAL HIGH (ref 22–32)
Calcium: 9 mg/dL (ref 8.9–10.3)
Chloride: 93 mmol/L — ABNORMAL LOW (ref 101–111)
Creatinine, Ser: 0.85 mg/dL (ref 0.44–1.00)
GFR calc Af Amer: >60 mL/min
GFR calc non Af Amer: >60 mL/min
Glucose, Bld: 44 mg/dL — CL (ref 65–99)
Potassium: 3.6 mmol/L (ref 3.5–5.1)
Sodium: 140 mmol/L (ref 135–145)

## 2016-03-05 LAB — CBC
HEMATOCRIT: 28.4 % — AB (ref 36.0–46.0)
HEMOGLOBIN: 8 g/dL — AB (ref 12.0–15.0)
MCH: 21.4 pg — AB (ref 26.0–34.0)
MCHC: 28.2 g/dL — AB (ref 30.0–36.0)
MCV: 75.9 fL — ABNORMAL LOW (ref 78.0–100.0)
Platelets: 435 10*3/uL — ABNORMAL HIGH (ref 150–400)
RBC: 3.74 MIL/uL — ABNORMAL LOW (ref 3.87–5.11)
RDW: 17.6 % — AB (ref 11.5–15.5)
WBC: 8.8 10*3/uL (ref 4.0–10.5)

## 2016-03-05 MED ORDER — METHYLPREDNISOLONE SODIUM SUCC 40 MG IJ SOLR
40.0000 mg | Freq: Two times a day (BID) | INTRAMUSCULAR | Status: DC
  Administered 2016-03-05 – 2016-03-08 (×6): 40 mg via INTRAVENOUS
  Filled 2016-03-05 (×6): qty 1

## 2016-03-05 MED ORDER — INSULIN GLARGINE 100 UNIT/ML ~~LOC~~ SOLN
35.0000 [IU] | Freq: Every day | SUBCUTANEOUS | Status: DC
  Administered 2016-03-05 – 2016-03-07 (×3): 35 [IU] via SUBCUTANEOUS
  Filled 2016-03-05 (×4): qty 0.35

## 2016-03-05 NOTE — Progress Notes (Signed)
Physical Therapy Treatment Patient Details Name: [name] MRN: [id number] DOB: 05-Dec-1960 Today's Date: 03/05/2016    History of Present Illness Pt is a 55 y/o F who presented w/ worsening SOB. [name] on home O2 3L.  Pt w/ recent hospitalization w/ similar presentation.  Pt's PMH includes anemia, chronic back pain, neuropathy, major depressive disorder, MI, panic disorder w/ agoraphobia, bipolar disorder, Rt humeral fx, Lt patella fx, foot surgery, back surgery, ORIF Lt ankle.    PT Comments    Ms. Mcdanel made modest progress today, ambulating in room w/ RW and min guard assist.  SpO2 dropped to 71% on 4L O2 as pt breathing in/out of mouth only.  She requires constant cues for pursed lip breathing which, along with a rest break, brings her SpO2 up to 98%.  Pt experienced brief panic attack during session when door closed due to fire alarm (pt claustrophobic).     Follow Up Recommendations  Home health PT;Supervision for mobility/OOB     Equipment Recommendations  None recommended by PT    Recommendations for Other Services OT consult     Precautions / Restrictions Precautions Precautions: Fall Precaution Comments: monitor O2 Restrictions Weight Bearing Restrictions: No    Mobility  Bed Mobility Overal bed mobility: Needs Assistance Bed Mobility: Supine to Sit;Sit to Supine     Supine to sit: Min guard;HOB elevated Sit to supine: Min guard   General bed mobility comments: HOB elevated and pt uses bed rail for supine>sit.  No physical assist needed.  Transfers Overall transfer level: Needs assistance Equipment used: Rolling walker (2 wheeled) Transfers: Sit to/from Stand Sit to Stand: Min assist         General transfer comment: Assist to steady when standing from bed w/ cues for hand placement.  Ambulation/Gait Ambulation/Gait assistance: Min guard Ambulation Distance (Feet): 30 Feet Assistive device: Rolling walker (2 wheeled) Gait Pattern/deviations:  Step-through pattern;Decreased stride length;Trunk flexed   Gait velocity interpretation: Below normal speed for age/gender General Gait Details: Flexed posture and pt fatigues quickly.  Constant cues for pursed lip breathing as pt's SpO2 drops to 71% on 4L O2 and pt is breathing in/out of mouth only.    Stairs            Wheelchair Mobility    Modified Rankin (Stroke Patients Only)       Balance Overall balance assessment: Needs assistance Sitting-balance support: Feet supported;No upper extremity supported Sitting balance-Leahy Scale: Good     Standing balance support: Bilateral upper extremity supported;During functional activity Standing balance-Leahy Scale: Poor Standing balance comment: Relies on UE support                    Cognition Arousal/Alertness: Awake/alert Behavior During Therapy: Anxious Overall Cognitive Status: Within Functional Limits for tasks assessed                      Exercises General Exercises - Upper Extremity Shoulder Flexion: Other (comment) (pt unable to tolerate due to Bil shoulder pain) Elbow Flexion: AROM;Both;15 reps;Seated Digit Composite Flexion: AROM;Both;10 reps;Seated General Exercises - Lower Extremity Long Arc Quad: Both;10 reps;Seated;Strengthening;Other (comment) (w/ light manual resistance)    General Comments General comments (skin integrity, edema, etc.): Fire alarm at start of session.  This PT followed protocol and shut the door.  Pt began to have a panic attack w/ quick shallow breaths and demanding that the door be opened (pt is claustrophobic).  Fire alarm cleared by overhead  and door opened and pt able to relax some.      Pertinent Vitals/Pain Pain Assessment: Faces Pain Score: 8  Pain Location: back; headache Pain Descriptors / Indicators: Headache;Aching;Constant Pain Intervention(s): Limited activity within patient's tolerance;Monitored during session;Utilized relaxation techniques    Home  Living                      Prior Function            PT Goals (current goals can now be found in the care plan section) Acute Rehab PT Goals Patient Stated Goal: none stated PT Goal Formulation: With patient Time For Goal Achievement: 03/17/16 Potential to Achieve Goals: Good Progress towards PT goals: Progressing toward goals (modeslty)    Frequency  Min 3X/week    PT Plan Current plan remains appropriate    Co-evaluation             End of Session Equipment Utilized During Treatment: Gait belt;Oxygen Activity Tolerance: Treatment limited secondary to medical complications (Comment);Patient limited by pain (hypoxia; panic attack) Patient left: in bed;with call bell/phone within reach;with bed alarm set     Time: [id number] PT Time Calculation (min) (ACUTE ONLY): 17 min  Charges:  $Therapeutic Exercise: 8-22 mins                    G Codes:       Encarnacion ChuAshley Ason Heslin PT, DPT  Pager: [phone] Phone: [phone] 03/05/2016, 2:08 PM

## 2016-03-05 NOTE — Progress Notes (Signed)
Patient ID: [name], female   DOB: [birth year], 55 y.o.   MRN: [id number]    PROGRESS NOTE    [name]  WUJ:[id number] DOB: 10/26/1960 DOA: 03/02/2016  PCP: [name], MD   Brief Narrative:  55 y.o. female with hypertension, anemia, depression, past heart attack, diabetes, COPD on chronic home O2 3 L via [place], presented to [facility] ED with main concern of several days duration of progressive dyspnea with exertion and occasionally present at rest. Initial exam consistent with acute CHF exacerbation and pt admitted for further management.   Assessment & Plan:   Principal Problem: Acute on chronic respiratory failure - multifactorial and secondary to acute on chronic diastolic CHF grade II and acute COPD exacerbation - pt responding well to Lasix 40 mg IV BID, weight is slightly down from 189 --> 188 lbs this AM and less crackles on exam  - pt with more more wheezing this AM, will continue BD's scheduled and as needed, add solumedrol to see if that will help  - will continue to monitor clinical response - daily weights, strict I/O, no need to repeat ECHO  - ? Aspiration PNA on CT chest, SLP requested, appreciate recommendations, thin regular diet recommended   Active Problems:   Diabetes mellitus, insulin dependent (IDDM), uncontrolled (HCC) - continue long acting insulin as well as SSI     Acute COPD (chronic obstructive pulmonary disease) (HCC) - management with bronchodilators as noted above - will add solumedrol as pt with more wheezing this AM  - continue with mucinex and robitussin as needed     Hypothyroidism - continue synthroid     Bipolar I disorder (HCC) - stable     Hyperlipidemia - continue statin     Hypokalemia - due to lasix - resolved - BMP in AM    Anemia of iron deficiency - no signs of active bleeding - pt reluctant to have blood transfusion - Hg up since yesterday from 7 --> 7.7 --> 8 this AM - CBC In AM    Hoarseness  - CT chest done  6/10 with High-grade stenosis of the cervical trachea, probably due to scarring from prior ET intubation.  - SLP done to evaluate for aspiration, regular thin liquid diet recommended     Morbid obesity due to excess calories  - Body mass index is 40.68 kg/(m^2).   DVT prophylaxis: Lovenox SQ Code Status: Full  Family Communication: Patient at bedside  Disposition Plan: Home once respiratory status better, held d/c today as pt had more wheezing but overall less crackles on exam   Consultants:   None  Procedures:   None  Antimicrobials:   None   Subjective: Reports feeling better but more wheezing this AM  Objective: Filed Vitals:   03/05/16 0719 03/05/16 0814 03/05/16 1226 03/05/16 1508  BP:   98/56   Pulse:   70   Temp:   98.6 F (37 C)   TempSrc:   Oral   Resp:   20   Weight: 85.4 kg (188 lb 4.4 oz)     SpO2:  98% 100% 99%    Intake/Output Summary (Last 24 hours) at 03/05/16 1808 Last data filed at 03/05/16 1518  Gross per 24 hour  Intake    720 ml  Output   1700 ml  Net   -980 ml   Filed Weights   03/03/16 0731 03/04/16 0152 03/05/16 0719  Weight: 85.3 kg (188 lb 0.8 oz) 85.049 kg (187 lb  8 oz) 85.4 kg (188 lb 4.4 oz)    Examination:  General exam: Appears calm and comfortable  Respiratory system: diminished breath sounds at bases with more expiratory wheezing, minimal crackles  Cardiovascular system: S1 & S2 heard, RRR. No JVD, rubs, gallops or clicks. +1 bilateral LE edema  Gastrointestinal system: Abdomen is nondistended, soft and nontender. Central nervous system: Alert and oriented. No focal neurological deficits. Psychiatry: Judgement and insight appear normal. Mood & affect appropriate.   Data Reviewed: I have personally reviewed following labs and imaging studies  CBC:  Recent Labs Lab 03/02/16 0211 03/02/16 1123 03/03/16 0214 03/04/16 0311 03/05/16 0238  WBC 11.0*  --  10.4 9.6 8.8  NEUTROABS 10.0*  --   --   --   --   HGB 8.0*  8.6* 7.0* 7.7* 8.0*  HCT 27.4* 29.3* 23.9* 26.9* 28.4*  MCV 75.5*  --  74.0* 76.2* 75.9*  PLT 446*  --  390 378 435*   Basic Metabolic Panel:  Recent Labs Lab 03/02/16 0211 03/03/16 0214 03/04/16 0311 03/05/16 0238  NA 138 138 140 140  K 3.2* 3.6 3.5 3.6  CL 97* 96* 96* 93*  CO2 31 33* 35* 37*  GLUCOSE 123* 225* 81 44*  BUN 14 31* 25* 22*  CREATININE 0.78 0.93 0.80 0.85  CALCIUM 9.1 9.1 8.9 9.0   Liver Function Tests:  Recent Labs Lab 03/02/16 0211  AST 14*  ALT 7*  ALKPHOS 87  BILITOT 0.9  PROT 6.4*  ALBUMIN 3.1*   Cardiac Enzymes:  Recent Labs Lab 03/02/16 1500 03/02/16 2136 03/03/16 0214  TROPONINI <0.03 <0.03 <0.03   CBG:  Recent Labs Lab 03/05/16 0437 03/05/16 0628 03/05/16 0825 03/05/16 1118 03/05/16 1637  GLUCAP 77 130* 141* 187* 109*   Urine analysis:    Component Value Date/Time   COLORURINE YELLOW 03/02/2016 0914   APPEARANCEUR CLEAR 03/02/2016 0914   LABSPEC 1.008 03/02/2016 0914   PHURINE 6.5 03/02/2016 0914   GLUCOSEU NEGATIVE 03/02/2016 0914   HGBUR NEGATIVE 03/02/2016 0914   BILIRUBINUR NEGATIVE 03/02/2016 0914   KETONESUR NEGATIVE 03/02/2016 0914   PROTEINUR NEGATIVE 03/02/2016 0914   UROBILINOGEN 0.2 07/29/2015 1948   NITRITE NEGATIVE 03/02/2016 0914   LEUKOCYTESUR NEGATIVE 03/02/2016 0914   Radiology Studies: Dg Neck Soft Tissue 03/02/2016  Negative.   Ct Chest Wo Contrast 03/02/2016  High-grade stenosis of the cervical trachea, probably due to scarring from prior endotracheal intubation. Mild scattered secretions within the thoracic tracheal lumen. 2. Patchy dependent areas of consolidation in both lower lobes with associated mild volume loss. Although likely partially due to atelectasis, a component of aspiration pneumonia is suspected. 3. Stable mild cardiomegaly. Diffuse ground-glass opacity throughout both lungs, suspect mild pulmonary edema. 4. Mosaic attenuation in the lungs, most commonly due to air trapping from small  airways disease. 5. Two vessel coronary atherosclerosis. 6. Stable mild right paratracheal mediastinal lymphadenopathy, probably reactive. 7. Trace bilateral pleural effusions.   Dg Chest Port 1 View 03/02/2016 Vascular congestion and mild cardiomegaly. Increased interstitial markings raise concern for pulmonary edema.   Scheduled Meds: . ARIPiprazole  5 mg Oral Daily  . aspirin EC  81 mg Oral Daily  . divalproex  250 mg Oral Q12H  . enoxaparin (LOVENOX) injection  40 mg Subcutaneous Q24H  . furosemide  40 mg Intravenous BID  . insulin aspart  0-20 Units Subcutaneous TID WC  . insulin glargine  35 Units Subcutaneous QHS  . ipratropium-albuterol  3 mL Nebulization TID  .  levothyroxine  125 mcg Oral QAC breakfast  . loratadine  10 mg Oral Daily  . losartan  100 mg Oral Daily  . methylPREDNISolone (SOLU-MEDROL) injection  40 mg Intravenous Q12H  . metoprolol tartrate  12.5 mg Oral BID  . pantoprazole  40 mg Oral Daily  . potassium chloride SA  20 mEq Oral Daily  . simvastatin  10 mg Oral Daily  . sodium chloride flush  3 mL Intravenous Q12H   Continuous Infusions:    LOS: 3 days    Time spent: 20 minutes    Debbora Presto, MD [facility] Pager [phone]  If 7PM-7AM, please contact night-coverage [email or web address] Password [facility] 03/05/2016, 6:08 PM

## 2016-03-05 NOTE — Progress Notes (Signed)
Hypoglycemic Event  CBG: 45  Treatment: 15 GM carbohydrate snack  Symptoms: None  Follow-up CBG: Time 05:00 CBG Result:77  Possible Reasons for Event: Medication regimen: 45 units of Lantus given at bedtime  Comments/MD notified: K. Schorr covering was notified.    [name], [name]

## 2016-03-05 NOTE — Progress Notes (Signed)
Speech Language Pathology Treatment: Dysphagia  Patient Details Name: [name] MRN: [id number] DOB: [birth year] Today's Date: 03/05/2016 Time: 4445-8483 SLP Time Calculation (min) (ACUTE ONLY): 9 min  Assessment / Plan / Recommendation Clinical Impression  Pt seen for dysphagia followup. Pt able to verbalize and demonstrate all swallow precautions introduced in previous session without assist. No overt s/s aspiration noted with trials of thin. Again reviewed rationale for continued compliance with aspiration precautions even once discharged home- pt in agreement. No further SLP services indicated at this time. Will sign off.   HPI HPI: 55 y.o. female with hypertension, anemia, depression, past heart attack, diabetes, COPD on chronic home O2 3 L via [place], presented to [facility] ED with main concern of several days duration of progressive dyspnea with exertion and occasionally present at rest. Initial exam consistent with acute CHF exacerbation and pt admitted for further management.       SLP Plan  All goals met;Discharge SLP treatment due to (comment)     Recommendations  Diet recommendations: Regular;Thin liquid Liquids provided via: Cup;Straw Medication Administration: Whole meds with puree Supervision: Patient able to self feed Compensations: Small sips/bites;Clear throat intermittently Postural Changes and/or Swallow Maneuvers: Out of bed for meals             Oral Care Recommendations: Oral care QID Follow up Recommendations: None Plan: All goals met;Discharge SLP treatment due to (comment)     GO           Functional Assessment Tool Used: clinician judgement Functional Limitations: Swallowing Swallow Goal Status ([id number]): At least 1 percent but less than 20 percent impaired, limited or restricted Swallow Discharge Status [phone]): At least 1 percent but less than 20 percent impaired, limited or restricted    Vinetta Bergamo MA, [place] Pager [phone] 03/05/2016, 11:03  AM

## 2016-03-05 NOTE — Progress Notes (Signed)
Inpatient Diabetes Program Recommendations  AACE/ADA: New Consensus Statement on Inpatient Glycemic Control (2015)  Target Ranges:  Prepandial:   less than 140 mg/dL      Peak postprandial:   less than 180 mg/dL (1-2 hours)      Critically ill patients:  140 - 180 mg/dL   Results for [name], [name] (MRN [id number]) as of 03/05/2016 10:37  Ref. Range 03/04/2016 21:45 03/05/2016 04:05 03/05/2016 04:37 03/05/2016 06:28 03/05/2016 08:25  Glucose-Capillary Latest Ref Range: 65-99 mg/dL 956140 (H) 45 (L) 77 213130 (H) 141 (H)   Review of Glycemic Control  Diabetes history: DM 2 Outpatient Diabetes medications: Amaryl 4 mg Daily, Novolog CBG 100-200 6-8 units, >200 15 units TID, Lantus 45 units QHS Current orders for Inpatient glycemic control: Lantus 45 units, Novolog Resistant TID  Inpatient Diabetes Program Recommendations:   Insulin - Basal: Patient had hypoglycemia this am. Patient also with variable intake. Please consider decreasing basal insulin to Lantus 35-40 units while inpatient.  Thanks,  Christena DeemShannon Michaela Broski RN, MSN, [facility] Inpatient Diabetes Coordinator Team Pager [phone] (8a-5p)

## 2016-03-06 ENCOUNTER — Ambulatory Visit: Payer: Self-pay | Admitting: Nurse Practitioner

## 2016-03-06 LAB — BASIC METABOLIC PANEL
ANION GAP: 7 (ref 5–15)
BUN: 25 mg/dL — AB (ref 6–20)
CALCIUM: 8.8 mg/dL — AB (ref 8.9–10.3)
CO2: 36 mmol/L — ABNORMAL HIGH (ref 22–32)
Chloride: 92 mmol/L — ABNORMAL LOW (ref 101–111)
Creatinine, Ser: 0.97 mg/dL (ref 0.44–1.00)
GFR calc Af Amer: >60 mL/min (ref >60)
GLUCOSE: 415 mg/dL — AB (ref 65–99)
Potassium: 4.9 mmol/L (ref 3.5–5.1)
Sodium: 135 mmol/L (ref 135–145)

## 2016-03-06 LAB — CBC
HCT: 29.7 % — ABNORMAL LOW (ref 36.0–46.0)
HEMOGLOBIN: 8.3 g/dL — AB (ref 12.0–15.0)
MCH: 21 pg — ABNORMAL LOW (ref 26.0–34.0)
MCHC: 27.9 g/dL — ABNORMAL LOW (ref 30.0–36.0)
MCV: 75 fL — ABNORMAL LOW (ref 78.0–100.0)
PLATELETS: 379 10*3/uL (ref 150–400)
RBC: 3.96 MIL/uL (ref 3.87–5.11)
RDW: 17.2 % — AB (ref 11.5–15.5)
WBC: 8.4 10*3/uL (ref 4.0–10.5)

## 2016-03-06 LAB — GLUCOSE, CAPILLARY
GLUCOSE-CAPILLARY: 221 mg/dL — AB (ref 65–99)
GLUCOSE-CAPILLARY: 202 mg/dL — AB (ref 65–99)
GLUCOSE-CAPILLARY: 199 mg/dL — AB (ref 65–99)
Glucose-Capillary: 406 mg/dL — ABNORMAL HIGH (ref 65–99)
Glucose-Capillary: 415 mg/dL — ABNORMAL HIGH (ref 65–99)
Glucose-Capillary: 352 mg/dL — ABNORMAL HIGH (ref 65–99)

## 2016-03-06 MED ORDER — FUROSEMIDE 40 MG PO TABS
40.0000 mg | ORAL_TABLET | Freq: Two times a day (BID) | ORAL | Status: DC
  Administered 2016-03-06 – 2016-03-08 (×4): 40 mg via ORAL
  Filled 2016-03-06 (×4): qty 1

## 2016-03-06 MED ORDER — LOSARTAN POTASSIUM 50 MG PO TABS
50.0000 mg | ORAL_TABLET | Freq: Every day | ORAL | Status: DC
  Administered 2016-03-07 – 2016-03-08 (×2): 50 mg via ORAL
  Filled 2016-03-06 (×2): qty 1

## 2016-03-06 NOTE — Progress Notes (Signed)
Pt. Ordered Pulmonary toilet/Flutter, becomes more short of breath with increased wheeze with non-productive/loose cough, RT to monitor.

## 2016-03-06 NOTE — Progress Notes (Signed)
Inpatient Diabetes Program Recommendations  AACE/ADA: New Consensus Statement on Inpatient Glycemic Control (2015)  Target Ranges:  Prepandial:   less than 140 mg/dL      Peak postprandial:   less than 180 mg/dL (1-2 hours)      Critically ill patients:  140 - 180 mg/dL   Lab Results  Component Value Date   GLUCAP 352* 03/06/2016   HGBA1C 7.5* 01/11/2016    Review of Glycemic Control  Inpatient Diabetes Program Recommendations: Add Novolog 5 units TID with meals per Glycemic Control order-set. Thank you  Piedad ClimesGina Emmitte Surgeon MSN, RN,CDE Inpatient Diabetes Coordinator [phone] (team pager)

## 2016-03-06 NOTE — Progress Notes (Addendum)
Patient ID: Elizabeth BuryMary J Ridolfi, female   DOB: [birth year], 55 y.o.   MRN: [id number]    PROGRESS NOTE    [name]  WUJ:[id number] DOB: [birth year] DOA: 03/02/2016  PCP: Dorrene GermanEdwin A Avbuere, MD   Brief Narrative:  55 y.o. female with hypertension, anemia, depression, past heart attack, diabetes, COPD on chronic home O2 3 L via [place], presented to [facility] ED with main concern of several days duration of progressive dyspnea with exertion and occasionally present at rest. Initial exam consistent with acute CHF exacerbation and pt admitted for further management.   Assessment & Plan:   Principal Problem: Acute on chronic respiratory failure - multifactorial and secondary to acute on chronic diastolic CHF grade II and acute COPD exacerbation - pt responding well to Lasix 40 mg IV BID, weight is slightly down from 189 --> 188 --> 183 lbs this AM and less crackles on exam, will change Lasix to PO  - will continue BD's scheduled and as needed, continue solumedrol and plan on tapering down to Prednisone in AM - will continue to monitor clinical response - daily weights, strict I/O, no need to repeat ECHO  - ? Aspiration PNA on CT chest, SLP requested, appreciate recommendations, thin regular diet recommended   Active Problems:   Diabetes mellitus, insulin dependent (IDDM), uncontrolled (HCC) - continue long acting insulin as well as SSI     Acute COPD (chronic obstructive pulmonary disease) (HCC) - management with bronchodilators as noted above - continue solumedrol and change to PO Prednisone in AM  - continue with mucinex and robitussin as needed       Essential HTN - currently on Losartan 100 mg PO QD, Metoprolol 12.5 mg BID, Lasix 40 mg IV BID  - cute down the dose of Losartan to 50 mg PO QD and change Lasix to PO - if SBP < 90, will stop Losartan     Hypothyroidism - continue synthroid     Bipolar I disorder (HCC) - stable     Hyperlipidemia - continue statin     Hypokalemia - due to  lasix - supplemented and WNL - BMP in AM    Anemia of iron deficiency - no signs of active bleeding - pt reluctant to have blood transfusion - Hg up since yesterday from 7 --> 7.7 --> 8 --> 8.3 this AM - CBC In AM    Hoarseness  - CT chest done 6/10 with High-grade stenosis of the cervical trachea, probably due to scarring from prior ET intubation.  - SLP done to evaluate for aspiration, regular thin liquid diet recommended     Morbid obesity due to excess calories  - Body mass index is 40.68 kg/(m^2).   DVT prophylaxis: Lovenox SQ Code Status: Full  Family Communication: Patient at bedside  Disposition Plan: Home once respiratory status better, possible d/c in AM  Consultants:   None  Procedures:   None  Antimicrobials:   None   Subjective: Reports feeling better, less wheezing   Objective: Filed Vitals:   03/06/16 0205 03/06/16 0511 03/06/16 0527 03/06/16 1221  BP:   115/59 96/47  Pulse:   92 80  Temp:   97.9 F (36.6 C) 98.2 F (36.8 C)  TempSrc:   Oral Oral  Resp:   18 18  Weight:   83.235 kg (183 lb 8 oz)   SpO2: 98% 100% 100% 100%    Intake/Output Summary (Last 24 hours) at 03/06/16 1611 Last data filed at 03/06/16 1034  Gross  per 24 hour  Intake    720 ml  Output   1225 ml  Net   -505 ml   Filed Weights   03/04/16 0152 03/05/16 0719 03/06/16 0527  Weight: 85.049 kg (187 lb 8 oz) 85.4 kg (188 lb 4.4 oz) 83.235 kg (183 lb 8 oz)    Examination:  General exam: Appears calm and comfortable  Respiratory system: diminished breath sounds at bases with minimal expiratory wheezing  Cardiovascular system: S1 & S2 heard, RRR. No JVD, rubs, gallops or clicks. +1 bilateral LE edema  Gastrointestinal system: Abdomen is nondistended, soft and nontender. Central nervous system: Alert and oriented. No focal neurological deficits. Psychiatry: Judgement and insight appear normal. Mood & affect appropriate.   Data Reviewed: I have personally reviewed  following labs and imaging studies  CBC:  Recent Labs Lab 03/02/16 0211 03/02/16 1123 03/03/16 0214 03/04/16 0311 03/05/16 0238 03/06/16 0330  WBC 11.0*  --  10.4 9.6 8.8 8.4  NEUTROABS 10.0*  --   --   --   --   --   HGB 8.0* 8.6* 7.0* 7.7* 8.0* 8.3*  HCT 27.4* 29.3* 23.9* 26.9* 28.4* 29.7*  MCV 75.5*  --  74.0* 76.2* 75.9* 75.0*  PLT 446*  --  390 378 435* 379   Basic Metabolic Panel:  Recent Labs Lab 03/02/16 0211 03/03/16 0214 03/04/16 0311 03/05/16 0238 03/06/16 0330  NA 138 138 140 140 135  K 3.2* 3.6 3.5 3.6 4.9  CL 97* 96* 96* 93* 92*  CO2 31 33* 35* 37* 36*  GLUCOSE 123* 225* 81 44* 415*  BUN 14 31* 25* 22* 25*  CREATININE 0.78 0.93 0.80 0.85 0.97  CALCIUM 9.1 9.1 8.9 9.0 8.8*   Liver Function Tests:  Recent Labs Lab 03/02/16 0211  AST 14*  ALT 7*  ALKPHOS 87  BILITOT 0.9  PROT 6.4*  ALBUMIN 3.1*   Cardiac Enzymes:  Recent Labs Lab 03/02/16 1500 03/02/16 2136 03/03/16 0214  TROPONINI <0.03 <0.03 <0.03   CBG:  Recent Labs Lab 03/05/16 2107 03/06/16 0542 03/06/16 0544 03/06/16 0803 03/06/16 1221  GLUCAP 147* 415* 406* 352* 221*   Urine analysis:    Component Value Date/Time   COLORURINE YELLOW 03/02/2016 0914   APPEARANCEUR CLEAR 03/02/2016 0914   LABSPEC 1.008 03/02/2016 0914   PHURINE 6.5 03/02/2016 0914   GLUCOSEU NEGATIVE 03/02/2016 0914   HGBUR NEGATIVE 03/02/2016 0914   BILIRUBINUR NEGATIVE 03/02/2016 0914   KETONESUR NEGATIVE 03/02/2016 0914   PROTEINUR NEGATIVE 03/02/2016 0914   UROBILINOGEN 0.2 07/29/2015 1948   NITRITE NEGATIVE 03/02/2016 0914   LEUKOCYTESUR NEGATIVE 03/02/2016 0914   Radiology Studies: Dg Neck Soft Tissue 03/02/2016  Negative.   Ct Chest Wo Contrast 03/02/2016  High-grade stenosis of the cervical trachea, probably due to scarring from prior endotracheal intubation. Mild scattered secretions within the thoracic tracheal lumen. 2. Patchy dependent areas of consolidation in both lower lobes with  associated mild volume loss. Although likely partially due to atelectasis, a component of aspiration pneumonia is suspected. 3. Stable mild cardiomegaly. Diffuse ground-glass opacity throughout both lungs, suspect mild pulmonary edema. 4. Mosaic attenuation in the lungs, most commonly due to air trapping from small airways disease. 5. Two vessel coronary atherosclerosis. 6. Stable mild right paratracheal mediastinal lymphadenopathy, probably reactive. 7. Trace bilateral pleural effusions.   Dg Chest Port 1 View 03/02/2016 Vascular congestion and mild cardiomegaly. Increased interstitial markings raise concern for pulmonary edema.   Scheduled Meds: . ARIPiprazole  5 mg Oral Daily  .  aspirin EC  81 mg Oral Daily  . divalproex  250 mg Oral Q12H  . enoxaparin (LOVENOX) injection  40 mg Subcutaneous Q24H  . furosemide  40 mg Intravenous BID  . insulin aspart  0-20 Units Subcutaneous TID WC  . insulin glargine  35 Units Subcutaneous QHS  . levothyroxine  125 mcg Oral QAC breakfast  . loratadine  10 mg Oral Daily  . losartan  100 mg Oral Daily  . methylPREDNISolone (SOLU-MEDROL) injection  40 mg Intravenous Q12H  . metoprolol tartrate  12.5 mg Oral BID  . pantoprazole  40 mg Oral Daily  . potassium chloride SA  20 mEq Oral Daily  . simvastatin  10 mg Oral Daily  . sodium chloride flush  3 mL Intravenous Q12H   Continuous Infusions:    LOS: 4 days    Time spent: 20 minutes    Debbora Presto, MD [facility] Pager [phone]  If 7PM-7AM, please contact night-coverage [email or web address] Password [email or web address] 03/06/2016, 4:11 PM

## 2016-03-06 NOTE — Progress Notes (Signed)
Pt a/o, c/o pain, PRN pain meds as ordered, pt c/o SOB lungs clear, PRN xanax given and edu pt to take slow deep breaths to help w/breathing, pt now resting comfortably, VSS, pt stable

## 2016-03-07 LAB — CBC
HEMATOCRIT: 30 % — AB (ref 36.0–46.0)
HEMOGLOBIN: 8.5 g/dL — AB (ref 12.0–15.0)
MCH: 21.6 pg — AB (ref 26.0–34.0)
MCHC: 28.3 g/dL — ABNORMAL LOW (ref 30.0–36.0)
MCV: 76.1 fL — AB (ref 78.0–100.0)
Platelets: 390 10*3/uL (ref 150–400)
RBC: 3.94 MIL/uL (ref 3.87–5.11)
RDW: 17.2 % — ABNORMAL HIGH (ref 11.5–15.5)
WBC: 10.4 10*3/uL (ref 4.0–10.5)

## 2016-03-07 LAB — BASIC METABOLIC PANEL
ANION GAP: 9 (ref 5–15)
BUN: 23 mg/dL — ABNORMAL HIGH (ref 6–20)
CHLORIDE: 90 mmol/L — AB (ref 101–111)
CO2: 39 mmol/L — AB (ref 22–32)
Calcium: 9.4 mg/dL (ref 8.9–10.3)
Creatinine, Ser: 0.86 mg/dL (ref 0.44–1.00)
GFR calc Af Amer: >60 mL/min (ref >60)
GLUCOSE: 256 mg/dL — AB (ref 65–99)
POTASSIUM: 4.2 mmol/L (ref 3.5–5.1)
Sodium: 138 mmol/L (ref 135–145)

## 2016-03-07 LAB — GLUCOSE, CAPILLARY
GLUCOSE-CAPILLARY: 216 mg/dL — AB (ref 65–99)
GLUCOSE-CAPILLARY: 306 mg/dL — AB (ref 65–99)
GLUCOSE-CAPILLARY: 138 mg/dL — AB (ref 65–99)
Glucose-Capillary: 222 mg/dL — ABNORMAL HIGH (ref 65–99)
Glucose-Capillary: 313 mg/dL — ABNORMAL HIGH (ref 65–99)

## 2016-03-07 MED ORDER — LOSARTAN POTASSIUM 50 MG PO TABS: 50.0000 mg | ORAL_TABLET | Freq: Every day | ORAL | Status: AC

## 2016-03-07 MED ORDER — PREDNISONE 10 MG PO TABS: ORAL_TABLET | ORAL | Status: DC

## 2016-03-07 MED ORDER — HYDROCODONE-ACETAMINOPHEN 10-325 MG PO TABS: 1.0000 | ORAL_TABLET | Freq: Two times a day (BID) | ORAL | Status: DC | PRN

## 2016-03-07 NOTE — Progress Notes (Addendum)
Called by RN to assess pt for C/O SOB. RN gave Neb tx, pain meds and something for anxiety, pt states she still feels SOB. Pt appears to not be in any obvious respiratory distress at time of my assessment. Lungs are clear/diminished bilaterally with slight upper airway wheeze. Pt has a congested cough, and does sound hoarse when she speaks. PRN neb tx being given at this time. Post PRN neb, pt is moving more air, bilateral expiratory wheeze heard, still hearing upper airway wheeze. Pt states she does feel better after neb tx.  RT will continue to monitor.

## 2016-03-07 NOTE — Progress Notes (Signed)
Nutrition Brief Note  Patient identified on the Malnutrition Screening Tool (MST) Report  Wt Readings from Last 15 Encounters:  03/07/16 184 lb 6.4 oz (83.643 kg)  01/26/16 181 lb 7 oz (82.3 kg)  01/11/16 224 lb 10.4 oz (101.9 kg)  01/09/16 228 lb (103.42 kg)  10/17/15 216 lb 4.3 oz (98.1 kg)  10/12/15 199 lb (90.266 kg)  10/04/15 199 lb 4.8 oz (90.402 kg)  09/07/15 216 lb (97.977 kg)  08/22/15 214 lb (97.07 kg)  08/07/15 214 lb (97.07 kg)  07/31/15 208 lb 15.9 oz (94.8 kg)  07/07/15 205 lb (92.987 kg)  06/13/15 206 lb (93.441 kg)  06/02/15 202 lb (91.627 kg)  04/25/15 245 lb (111.131 kg)    Body mass index is 33.72 kg/(m^2). Patient meets criteria for Obesity based on current BMI. Pt states that she used to weigh 300 lbs, but she was intubated back in March and lost down to 188 lbs while in the ICU. She states that since March, she has been eating well and maintaining her weight around 185 lbs. She reports eating 3 meals daily with protein at most meals. RD emphasized the importance of a healthful diet with 3 daily meals and protein at every meal. Briefly discussed the heart healthy and carb modified diet. Pt denies any questions or concerns at this time. RD discussed supplement options if meal completion is poor; pt declined stating that she hopes to go home tomorrow. Pt appears well-nourished per nutrition-focused physical exam.   Current diet order is Heart Healthy/Carb Modified, patient is consuming approximately 40-85% of meals at this time. Labs and medications reviewed.   No nutrition interventions warranted at this time. If nutrition issues arise, please consult RD.   Dorothea Ogleeanne Tieshia Rettinger RD, LDN Inpatient Clinical Dietitian Pager: [phone] After Hours Pager: [phone]

## 2016-03-07 NOTE — Progress Notes (Signed)
Patient complaining of SOB and stated she can't breathe. O2 at 100%. Respiratory therapist notified. Nebulizer treatment given. Xanax and pain medicine given with good relief noted. Will continue to monitor.

## 2016-03-07 NOTE — Discharge Instructions (Signed)

## 2016-03-07 NOTE — Discharge Summary (Signed)
Physician Discharge Summary  [name] ZOX:[id number] DOB: [birth year] DOA: 03/02/2016  PCP: [name], MD  Admit date: 03/02/2016 Discharge date: 03/08/2016  Recommendations for Outpatient Follow-up:  1. Pt will need to follow up with PCP in 1-2 weeks post discharge 2. Please obtain BMP to evaluate electrolytes and kidney function 3. Please also check CBC to evaluate Hg and Hct levels 4. Please note that pt was advised to complete prednisone tapering upon discharge 5. Pt also advised to keep appointment with her cardiologist and pulmonologist, she says she has appointments scheduled   Discharge Diagnoses:  Principal Problem:   Acute CHF (congestive heart failure) (HCC) Active Problems:   Diabetes mellitus, insulin dependent (IDDM), uncontrolled (HCC)   COPD (chronic obstructive pulmonary disease) (HCC)   Hypothyroidism  Discharge Condition: Stable  Diet recommendation: Heart healthy diet discussed in details    Brief Narrative:  55 y.o. female with hypertension, anemia, depression, past heart attack, diabetes, COPD on chronic home O2 3 L via [place], presented to [facility] ED with main concern of several days duration of progressive dyspnea with exertion and occasionally present at rest. Initial exam consistent with acute CHF exacerbation and pt admitted for further management.   Assessment & Plan:  Principal Problem: Acute on chronic respiratory failure - multifactorial and secondary to acute on chronic diastolic CHF grade II and acute COPD exacerbation - pt responding well to Lasix 40 mg IV BID, weight is slightly down from 189 --> 188 --> 184 lbs this AM and less crackles on exam - continue home regimen Lasix 40 mg PO BID  - will continue BD's per home medical regimen, transition to oral prednisone with planned tapering  - ? Aspiration PNA on CT chest, SLP requested, appreciate recommendations, thin regular diet recommended   Active Problems:  Diabetes mellitus, insulin  dependent (IDDM), uncontrolled (HCC) - continue home medical regimen upon discharge    Acute COPD (chronic obstructive pulmonary disease) (HCC) - management with bronchodilators as noted above - continue solumedrol and change to PO Prednisone in AM with planned tapering upon discharge     Essential HTN - currently on Losartan 100 mg PO QD, Metoprolol 12.5 mg BID, Lasix 40 mg IV BID  - cut down the dose of Losartan to 50 mg PO QD as SBP was low in 100's   Hypothyroidism - continue synthroid    Bipolar I disorder (HCC) - stable    Hyperlipidemia - continue statin    Hypokalemia - due to lasix - supplemented and WNL - BMP in AM   Anemia of iron deficiency - no signs of active bleeding - pt reluctant to have blood transfusion - Hg up since yesterday from 7 --> 7.7 --> 8 --> 8.5 this AM   Hoarseness  - CT chest done 6/10 with High-grade stenosis of the cervical trachea, probably due to scarring from prior ET intubation.  - SLP done to evaluate for aspiration, regular thin liquid diet recommended    Morbid obesity due to excess calories  - Body mass index is 40.68 kg/(m^2).   DVT prophylaxis: Lovenox SQ Code Status: Full  Family Communication: Patient at bedside, husband over the phone, please call him in am 2 hours prior to discharge so he can arrange transport  Disposition Plan: Home in AM  Consultants:   None  Procedures:   None  Antimicrobials:   None  Discharge Exam: Filed Vitals:   03/06/16 1920 03/07/16 0608  BP: 106/46 110/40  Pulse: 77 82  Temp: 98.9 F (37.2 C) 98.2 F (36.8 C)  Resp: 20 18   Filed Vitals:   03/06/16 0527 03/06/16 1221 03/06/16 1920 03/07/16 0608  BP: 115/59 96/47 106/46 110/40  Pulse: 92 80 77 82  Temp: 97.9 F (36.6 C) 98.2 F (36.8 C) 98.9 F (37.2 C) 98.2 F (36.8 C)  TempSrc: Oral Oral Oral Oral  Resp: 18 18 20 18   Height:      Weight: 83.235 kg (183 lb 8 oz)   83.643 kg (184 lb 6.4 oz)  SpO2:  100% 100% 100%     General: Pt is alert, follows commands appropriately, not in acute distress, still with raspy and hoarse voice  Cardiovascular: Regular rate and rhythm, S1/S2 +, no rubs, no gallops Respiratory: diminished breath sounds at bases with minimal wheezing  Abdominal: Soft, non tender, non distended, bowel sounds +, no guarding Extremities: no cyanosis, pulses palpable bilaterally DP and PT Neuro: Grossly nonfocal  Discharge Instructions  Discharge Instructions    Diet - low sodium heart healthy    Complete by:  As directed      Increase activity slowly    Complete by:  As directed             Medication List    TAKE these medications        albuterol 108 (90 Base) MCG/ACT inhaler  Commonly known as:  PROAIR HFA  Inhale 2 puffs into the lungs every 6 (six) hours as needed for wheezing or shortness of breath. scheduled     ALPRAZolam 0.5 MG tablet  Commonly known as:  XANAX  Take 0.5 tablets (0.25 mg total) by mouth 2 (two) times daily as needed for anxiety.     ARIPiprazole 5 MG tablet  Commonly known as:  ABILIFY  Take 5 mg by mouth daily.     aspirin EC 81 MG tablet  Take 1 tablet (81 mg total) by mouth daily.     cetirizine 10 MG tablet  Commonly known as:  ZYRTEC  Take 10 mg by mouth daily.     divalproex 250 MG DR tablet  Commonly known as:  DEPAKOTE  Take 1 tablet (250 mg total) by mouth every 12 (twelve) hours.     furosemide 40 MG tablet  Commonly known as:  LASIX  Take 1 tablet (40 mg total) by mouth 2 (two) times daily.     glimepiride 4 MG tablet  Commonly known as:  AMARYL  Take 4 mg by mouth daily with breakfast.     HYDROcodone-acetaminophen 10-325 MG tablet  Commonly known as:  NORCO  Take 1 tablet by mouth 2 (two) times daily as needed for moderate pain.     insulin glargine 100 UNIT/ML injection  Commonly known as:  LANTUS  Inject 0.45 mLs (45 Units total) into the skin at bedtime.     ipratropium-albuterol 0.5-2.5 (3)  MG/3ML Soln  Commonly known as:  DUONEB  Take 3 mLs by nebulization every 2 (two) hours as needed.     levothyroxine 125 MCG tablet  Commonly known as:  SYNTHROID, LEVOTHROID  Take 1 tablet (125 mcg total) by mouth daily before breakfast.     losartan 50 MG tablet  Commonly known as:  COZAAR  Take 1 tablet (50 mg total) by mouth daily.     metoprolol tartrate 25 MG tablet  Commonly known as:  LOPRESSOR  Take 0.5 tablets (12.5 mg total) by mouth 2 (two) times daily.     NOVOLOG  FLEXPEN 100 UNIT/ML FlexPen  Generic drug:  insulin aspart  Inject 6-15 Units into the skin 3 (three) times daily as needed for high blood sugar (CBG >100). CBG 100-200 6-8 units, >200 15 units     omeprazole 20 MG capsule  Commonly known as:  PRILOSEC  Take 20 mg by mouth daily.     OXYGEN  Inhale into the lungs continuous. 3L     potassium chloride SA 20 MEQ tablet  Commonly known as:  K-DUR,KLOR-CON  Take 1 tablet (20 mEq total) by mouth daily.     predniSONE 10 MG tablet  Commonly known as:  DELTASONE  Take 50 mg table starting June 17th, 2017 and taper down by 10 mg daily until completed.     simvastatin 10 MG tablet  Commonly known as:  ZOCOR  Take 10 mg by mouth daily.            Follow-up Information    Follow up with [facility].   Why:  A nurse will continue to go to your home   Contact information:   [address] [place] [address] [phone]       Follow up with Dorrene GermanEdwin A Avbuere, MD.   Specialty:  Internal Medicine   Contact information:   [address] [place] [address] [phone]        The results of significant diagnostics from this hospitalization (including imaging, microbiology, ancillary and laboratory) are listed below for reference.     Microbiology: No results found for this or any previous visit (from the past 240 hour(s)).   Labs: Basic Metabolic Panel:  Recent Labs Lab 03/03/16 0214 03/04/16 0311  03/05/16 0238 03/06/16 0330 03/07/16 0619  NA 138 140 140 135 138  K 3.6 3.5 3.6 4.9 4.2  CL 96* 96* 93* 92* 90*  CO2 33* 35* 37* 36* 39*  GLUCOSE 225* 81 44* 415* 256*  BUN 31* 25* 22* 25* 23*  CREATININE 0.93 0.80 0.85 0.97 0.86  CALCIUM 9.1 8.9 9.0 8.8* 9.4   Liver Function Tests:  Recent Labs Lab 03/02/16 0211  AST 14*  ALT 7*  ALKPHOS 87  BILITOT 0.9  PROT 6.4*  ALBUMIN 3.1*   CBC:  Recent Labs Lab 03/02/16 0211  03/03/16 0214 03/04/16 0311 03/05/16 0238 03/06/16 0330 03/07/16 0619  WBC 11.0*  --  10.4 9.6 8.8 8.4 10.4  NEUTROABS 10.0*  --   --   --   --   --   --   HGB 8.0*  < > 7.0* 7.7* 8.0* 8.3* 8.5*  HCT 27.4*  < > 23.9* 26.9* 28.4* 29.7* 30.0*  MCV 75.5*  --  74.0* 76.2* 75.9* 75.0* 76.1*  PLT 446*  --  390 378 435* 379 390  < > = values in this interval not displayed. Cardiac Enzymes:  Recent Labs Lab 03/02/16 1500 03/02/16 2136 03/03/16 0214  TROPONINI <0.03 <0.03 <0.03   BNP: BNP (last 3 results)  Recent Labs  01/12/16 1707 01/14/16 1002 03/02/16 0213  BNP 821.4* 289.7* 508.6*   CBG:  Recent Labs Lab 03/06/16 1221 03/06/16 1702 03/06/16 2141 03/07/16 0711 03/07/16 0800  GLUCAP 221* 202* 199* 222* 216*   SIGNED: Time coordinating discharge: 30 minutes  MAGICK-Mishayla Sliwinski, MD  [facility] 03/07/2016, 9:26 AM Pager [phone]  If 7PM-7AM, please contact night-coverage [email or web address] Password [email or web address]

## 2016-03-07 NOTE — Progress Notes (Signed)
Physical Therapy Treatment Patient Details Name: [name] MRN: [id number] DOB: 16-Oct-1960 Today's Date: 03/07/2016    History of Present Illness Pt is a 55 y/o F who presented w/ worsening SOB. [name] on home O2 3L.  Pt w/ recent hospitalization w/ similar presentation.  Pt's PMH includes anemia, chronic back pain, neuropathy, major depressive disorder, MI, panic disorder w/ agoraphobia, bipolar disorder, Rt humeral fx, Lt patella fx, foot surgery, back surgery, ORIF Lt ankle.    PT Comments    [name] continues to have high anxiety regarding her SOB and chronic back pain.  She ambulated 40 ft using RW w/ min guard assist w/ SpO2 as low as 80% on 4L O2.  Pt will benefit from continued skilled PT services to increase functional independence and safety.   Follow Up Recommendations  Home health PT;Supervision for mobility/OOB     Equipment Recommendations  None recommended by PT    Recommendations for Other Services OT consult     Precautions / Restrictions Precautions Precautions: Fall Precaution Comments: monitor O2 Restrictions Weight Bearing Restrictions: No    Mobility  Bed Mobility Overal bed mobility: Needs Assistance Bed Mobility: Supine to Sit;Sit to Supine     Supine to sit: HOB elevated;Supervision Sit to supine: Supervision   General bed mobility comments: HOB elevated and pt uses bed rail for supine>sit.  No physical assist needed.  Transfers Overall transfer level: Needs assistance Equipment used: Rolling walker (2 wheeled) Transfers: Sit to/from Stand Sit to Stand: Min guard         General transfer comment: Min guard assist for safety.  Pt w/ safe technique.  Ambulation/Gait Ambulation/Gait assistance: Min guard Ambulation Distance (Feet): 40 Feet Assistive device: Rolling walker (2 wheeled) Gait Pattern/deviations: Step-through pattern;Decreased stride length;Trunk flexed   Gait velocity interpretation: Below normal speed for  age/gender General Gait Details: Flexed posture and pt fatigues quickly.  Cues for pursed lip breathing although pt performing w/ less cues this session.  SpO2 drops to 80% on 4L O2.  Pt becomes very anxious once out in hallway about back pain.   Stairs            Wheelchair Mobility    Modified Rankin (Stroke Patients Only)       Balance Overall balance assessment: Needs assistance Sitting-balance support: No upper extremity supported;Feet supported Sitting balance-Leahy Scale: Good     Standing balance support: Bilateral upper extremity supported;During functional activity Standing balance-Leahy Scale: Poor Standing balance comment: Relies on UE support                    Cognition Arousal/Alertness: Awake/alert Behavior During Therapy: Anxious Overall Cognitive Status: Within Functional Limits for tasks assessed                      Exercises General Exercises - Upper Extremity Shoulder Flexion:  (pt unable to tolerate due to Bil shoulder pain) Elbow Flexion: AROM;Both;Seated;10 reps General Exercises - Lower Extremity Ankle Circles/Pumps: AROM;Both;10 reps;Seated Long Arc Quad: Both;10 reps;Seated;Strengthening;Other (comment) Other Exercises Other Exercises: Encouraged pt to continue practicing pursed lip breathing throughout day.    General Comments        Pertinent Vitals/Pain Pain Assessment: 0-10 Pain Score: 8  Pain Location: back Pain Descriptors / Indicators: Constant;Aching;Grimacing Pain Intervention(s): Limited activity within patient's tolerance;Monitored during session    Home Living  Prior Function            PT Goals (current goals can now be found in the care plan section) Acute Rehab PT Goals Patient Stated Goal: none stated PT Goal Formulation: With patient Time For Goal Achievement: 03/17/16 Potential to Achieve Goals: Good Progress towards PT goals: Progressing toward goals     Frequency  Min 3X/week    PT Plan Current plan remains appropriate    Co-evaluation             End of Session Equipment Utilized During Treatment: Gait belt;Oxygen Activity Tolerance: Treatment limited secondary to medical complications (Comment);Patient limited by pain (hypoxia) Patient left: in bed;with call bell/phone within reach;with bed alarm set (pt refuses to sit in chair)     Time: 6295-2841 PT Time Calculation (min) (ACUTE ONLY): 16 min  Charges:  $Gait Training: 8-22 mins                    G Codes:       Encarnacion Chu PT, DPT  Pager: [phone] Phone: [phone] 03/07/2016, 12:08 PM

## 2016-03-08 DIAGNOSIS — D649 Anemia, unspecified: Secondary | ICD-10-CM

## 2016-03-08 LAB — GLUCOSE, CAPILLARY
GLUCOSE-CAPILLARY: 304 mg/dL — AB (ref 65–99)
GLUCOSE-CAPILLARY: 185 mg/dL — AB (ref 65–99)

## 2016-03-08 MED ORDER — METOCLOPRAMIDE HCL 5 MG/ML IJ SOLN
10.0000 mg | Freq: Once | INTRAMUSCULAR | Status: DC
Start: 2016-03-08 — End: 2016-03-08

## 2016-03-08 MED ORDER — DIPHENHYDRAMINE HCL 50 MG/ML IJ SOLN: 25.0000 mg | Freq: Once | INTRAMUSCULAR | Status: DC

## 2016-03-08 MED ORDER — KETOROLAC TROMETHAMINE 30 MG/ML IJ SOLN: 30.0000 mg | Freq: Once | INTRAMUSCULAR | Status: DC

## 2016-03-08 NOTE — Care Management (Signed)
CM contacted husband to inform of discharge home - husband stated he doesn't have transportation to get wife home - states that Dr Lenise ArenaMeyers informed him that the hospital would arrange transportation home.  CM consulted CSW .  Husband requested he be notified prior to pt leaving hospital - bedside nurse agreed to call prior to pt leaving facility

## 2016-03-08 NOTE — Progress Notes (Signed)
All d/c instructions explained and given to pt.  Verbalized understanding. .  Satish Hammers, RN. 

## 2016-03-08 NOTE — Care Management (Signed)
CM contacted by bedside nurse - husband is ready for pt to come home via [facility].  CM contacted [facility] - referral accepted and tentative pick up within the next hour.  Bedside nurse to inform pt that arrangements have been completed

## 2016-03-08 NOTE — Progress Notes (Signed)
Pt d/c off floor at 1639 via ambulance.  Zeniah Briney,RN.

## 2016-03-08 NOTE — Clinical Social Work Note (Addendum)
CSW went to patient a taxi voucher but she stated that she wanted [facility]. CSW told patient that cost would be about $400 [facility] [place]. Patient agreeable to paying. CSW called patient's husband. He stated that he does not know where she is going to get that money from and would need to take a taxi. Patient became very upset because she is nervous about her oxygen. [facility] and [facility]. Patient does not have a key but patient's husband will call RN when he returns home from the store so he can pick up items that patient needs. RNCM notified about need for [facility] home.  CSW signing off.  Charlynn CourtSarah Janis [name], CSW [phone]

## 2016-03-08 NOTE — Care Management Note (Addendum)
Case Management Note  Patient Details  Name: Elizabeth BuryMary J [name] MRN: [id number] Date of Birth: 1961/07/07  Subjective/Objective:       Admitted with Acute CHF             Action/Plan: Patient lives at home with spouse and daughter; wheelchair bound; Has private insurance with Medicaid with prescription drug coverage; pharmacy of choice [facility] order pharmacy; [facility]; patient doe not qualify for HHPT, under Medicaid the patient must have a qualifying diagnosis for home health PT; DME- pt has a hospital bed, wheelchair, walker, bedside commode and home oxygen; She also have a personal care service that provides a nursing assistant 7 days a week for 2-3 hrs to assist in her care;   Expected Discharge Date:      Possibly 03/05/2016            Expected Discharge Plan:  Home w Home Health Services  Discharge planning Services  CM Consult  Choice offered to:  Patient  HH Arranged:  RN, Disease Management HH Agency:  [facility]  Status of Service:  Completed, signed off  Samuel BoucheClaxton, Massa Pe S, RN,MHA,BSN [phone] 03/08/2016, 11:24 AM CM contacted [facility] to inform that resumption orders are written -discharge today.  Pt declined PT due to hardship and elected to keep current aide/service in place. Per pt;  Pt was on same titration of oxygen (3L) prior to admit that was deemed necessary at home - CM arranged for portable [facility].

## 2016-03-08 NOTE — Progress Notes (Signed)
03/08/2016 11:09 AM  Progress Note  Pt was seen and examined.  Pt says she is feeling ready to go home and will follow up as recommended.  Will notify husband.  Agree with discharge today.   Filed Vitals:   03/08/16 0629 03/08/16 0855  BP: 118/51 124/68  Pulse: 70 77  Temp: 97.8 F (36.6 C)   Resp: 19    General: Pt is alert, follows commands appropriately, not in acute distress, still with raspy and hoarse voice  Cardiovascular: Regular rate and rhythm, S1/S2 +, no rubs, no gallops Respiratory: diminished breath sounds at bases with minimal wheezing  Abdominal: Soft, non tender, non distended, bowel sounds +, no guarding Extremities: no cyanosis, pulses palpable bilaterally DP and PT Neuro: Grossly nonfocal  A/P: Please see DC summary.  Clanford [employer]ions Financial CorporationJohnson

## 2016-03-09 ENCOUNTER — Emergency Department (HOSPITAL_COMMUNITY): Payer: Medicaid Other

## 2016-03-09 ENCOUNTER — Other Ambulatory Visit: Payer: Self-pay

## 2016-03-09 ENCOUNTER — Encounter (HOSPITAL_COMMUNITY): Payer: Self-pay | Admitting: Emergency Medicine

## 2016-03-09 ENCOUNTER — Inpatient Hospital Stay (HOSPITAL_COMMUNITY)
Admission: EM | Admit: 2016-03-09 | Discharge: 2016-03-13 | DRG: 191 | Disposition: A | Discharge status: Home Health | Payer: Medicaid Other | Attending: Internal Medicine | Admitting: Internal Medicine

## 2016-03-09 DIAGNOSIS — D509 Iron deficiency anemia, unspecified: Secondary | ICD-10-CM | POA: Diagnosis present

## 2016-03-09 DIAGNOSIS — R911 Solitary pulmonary nodule: Secondary | ICD-10-CM | POA: Diagnosis present

## 2016-03-09 DIAGNOSIS — Z981 Arthrodesis status: Secondary | ICD-10-CM

## 2016-03-09 DIAGNOSIS — F3170 Bipolar disorder, currently in remission, most recent episode unspecified: Secondary | ICD-10-CM | POA: Diagnosis present

## 2016-03-09 DIAGNOSIS — Z794 Long term (current) use of insulin: Secondary | ICD-10-CM

## 2016-03-09 DIAGNOSIS — E785 Hyperlipidemia, unspecified: Secondary | ICD-10-CM | POA: Diagnosis present

## 2016-03-09 DIAGNOSIS — I252 Old myocardial infarction: Secondary | ICD-10-CM

## 2016-03-09 DIAGNOSIS — Z801 Family history of malignant neoplasm of trachea, bronchus and lung: Secondary | ICD-10-CM

## 2016-03-09 DIAGNOSIS — E039 Hypothyroidism, unspecified: Secondary | ICD-10-CM | POA: Diagnosis present

## 2016-03-09 DIAGNOSIS — J441 Chronic obstructive pulmonary disease with (acute) exacerbation: Principal | ICD-10-CM | POA: Diagnosis present

## 2016-03-09 DIAGNOSIS — IMO0001 Reserved for inherently not codable concepts without codable children: Secondary | ICD-10-CM | POA: Diagnosis present

## 2016-03-09 DIAGNOSIS — E119 Type 2 diabetes mellitus without complications: Secondary | ICD-10-CM | POA: Diagnosis present

## 2016-03-09 DIAGNOSIS — K219 Gastro-esophageal reflux disease without esophagitis: Secondary | ICD-10-CM | POA: Diagnosis present

## 2016-03-09 DIAGNOSIS — G8929 Other chronic pain: Secondary | ICD-10-CM | POA: Diagnosis present

## 2016-03-09 DIAGNOSIS — Z9981 Dependence on supplemental oxygen: Secondary | ICD-10-CM

## 2016-03-09 DIAGNOSIS — Z87891 Personal history of nicotine dependence: Secondary | ICD-10-CM

## 2016-03-09 DIAGNOSIS — I5032 Chronic diastolic (congestive) heart failure: Secondary | ICD-10-CM | POA: Diagnosis present

## 2016-03-09 DIAGNOSIS — D638 Anemia in other chronic diseases classified elsewhere: Secondary | ICD-10-CM | POA: Diagnosis present

## 2016-03-09 DIAGNOSIS — F411 Generalized anxiety disorder: Secondary | ICD-10-CM | POA: Diagnosis present

## 2016-03-09 DIAGNOSIS — Z96612 Presence of left artificial shoulder joint: Secondary | ICD-10-CM | POA: Diagnosis present

## 2016-03-09 DIAGNOSIS — I050 Rheumatic mitral stenosis: Secondary | ICD-10-CM | POA: Diagnosis present

## 2016-03-09 DIAGNOSIS — I10 Essential (primary) hypertension: Secondary | ICD-10-CM | POA: Diagnosis present

## 2016-03-09 DIAGNOSIS — Z8249 Family history of ischemic heart disease and other diseases of the circulatory system: Secondary | ICD-10-CM

## 2016-03-09 DIAGNOSIS — E1165 Type 2 diabetes mellitus with hyperglycemia: Secondary | ICD-10-CM

## 2016-03-09 DIAGNOSIS — I110 Hypertensive heart disease with heart failure: Secondary | ICD-10-CM | POA: Diagnosis present

## 2016-03-09 LAB — CBC WITH DIFFERENTIAL/PLATELET
BASOS ABS: 0 10*3/uL (ref 0.0–0.1)
BASOS PCT: 0 %
EOS ABS: 0 10*3/uL (ref 0.0–0.7)
EOS PCT: 0 %
HCT: 30.7 % — ABNORMAL LOW (ref 36.0–46.0)
Hemoglobin: 9.2 g/dL — ABNORMAL LOW (ref 12.0–15.0)
LYMPHS PCT: 12 %
Lymphs Abs: 1.7 10*3/uL (ref 0.7–4.0)
MCH: 22.4 pg — ABNORMAL LOW (ref 26.0–34.0)
MCHC: 30 g/dL (ref 30.0–36.0)
MCV: 74.9 fL — ABNORMAL LOW (ref 78.0–100.0)
MONOS PCT: 5 %
Monocytes Absolute: 0.7 10*3/uL (ref 0.1–1.0)
NEUTROS ABS: 11.4 10*3/uL — AB (ref 1.7–7.7)
NEUTROS PCT: 83 %
Platelets: 344 10*3/uL (ref 150–400)
RBC: 4.1 MIL/uL (ref 3.87–5.11)
RDW: 17.8 % — ABNORMAL HIGH (ref 11.5–15.5)
WBC: 13.8 10*3/uL — ABNORMAL HIGH (ref 4.0–10.5)

## 2016-03-09 LAB — BRAIN NATRIURETIC PEPTIDE: B NATRIURETIC PEPTIDE 5: 181.7 pg/mL — AB (ref 0.0–100.0)

## 2016-03-09 LAB — BASIC METABOLIC PANEL
Anion gap: 12 (ref 5–15)
BUN: 25 mg/dL — AB (ref 6–20)
CALCIUM: 8.9 mg/dL (ref 8.9–10.3)
CHLORIDE: 91 mmol/L — AB (ref 101–111)
CO2: 38 mmol/L — ABNORMAL HIGH (ref 22–32)
CREATININE: 0.92 mg/dL (ref 0.44–1.00)
GFR calc Af Amer: >60 mL/min (ref >60)
GFR calc non Af Amer: >60 mL/min (ref >60)
Glucose, Bld: 281 mg/dL — ABNORMAL HIGH (ref 65–99)
Potassium: 3.5 mmol/L (ref 3.5–5.1)
SODIUM: 141 mmol/L (ref 135–145)

## 2016-03-09 LAB — I-STAT CG4 LACTIC ACID, ED
LACTIC ACID, VENOUS: 2.24 mmol/L — AB (ref 0.5–2.0)
Lactic Acid, Venous: 3.91 mmol/L (ref 0.5–2.0)

## 2016-03-09 LAB — I-STAT TROPONIN, ED: TROPONIN I, POC: 0 ng/mL (ref 0.00–0.08)

## 2016-03-09 LAB — D-DIMER, QUANTITATIVE: D-Dimer, Quant: 0.8 ug/mL-FEU — ABNORMAL HIGH (ref 0.00–0.50)

## 2016-03-09 MED ORDER — ALPRAZOLAM 0.25 MG PO TABS
0.2500 mg | ORAL_TABLET | Freq: Two times a day (BID) | ORAL | Status: DC | PRN
  Administered 2016-03-09 – 2016-03-13 (×8): 0.25 mg via ORAL
  Filled 2016-03-09 (×8): qty 1

## 2016-03-09 MED ORDER — IOPAMIDOL (ISOVUE-370) INJECTION 76%
100.0000 mL | Freq: Once | INTRAVENOUS | Status: AC | PRN
  Administered 2016-03-09: 100 mL via INTRAVENOUS

## 2016-03-09 MED ORDER — PREDNISONE 20 MG PO TABS
40.0000 mg | ORAL_TABLET | Freq: Once | ORAL | Status: AC
  Administered 2016-03-09: 40 mg via ORAL
  Filled 2016-03-09: qty 2

## 2016-03-09 MED ORDER — ALBUTEROL SULFATE (2.5 MG/3ML) 0.083% IN NEBU
5.0000 mg | INHALATION_SOLUTION | Freq: Once | RESPIRATORY_TRACT | Status: AC
  Administered 2016-03-09: 5 mg via RESPIRATORY_TRACT
  Filled 2016-03-09: qty 6

## 2016-03-09 MED ORDER — HYDROCODONE-ACETAMINOPHEN 10-325 MG PO TABS
1.0000 | ORAL_TABLET | Freq: Two times a day (BID) | ORAL | Status: DC | PRN
  Administered 2016-03-09 – 2016-03-13 (×8): 1 via ORAL
  Filled 2016-03-09 (×8): qty 1

## 2016-03-09 MED ORDER — ALBUTEROL (5 MG/ML) CONTINUOUS INHALATION SOLN
15.0000 mg | INHALATION_SOLUTION | Freq: Once | RESPIRATORY_TRACT | Status: AC
  Administered 2016-03-09: 15 mg via RESPIRATORY_TRACT
  Filled 2016-03-09: qty 20

## 2016-03-09 NOTE — ED Notes (Signed)
Unable to find a vein to collect blood samples

## 2016-03-09 NOTE — ED Notes (Addendum)
Pt from home via EMS with complaints of SOB. Pt has history of COPD and anxiety. Pt used her albuterol 3 times and EMS did 2 duonebs PTA. Pt has slight wheezes and pt was at 100% on RA when EMS arrived. Pt is anxious and was put back on 4 L for comfort by EMS. Pt was ambulatory on the scene

## 2016-03-09 NOTE — ED Notes (Signed)
Pt to xray
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
## 2016-03-10 DIAGNOSIS — Z96612 Presence of left artificial shoulder joint: Secondary | ICD-10-CM | POA: Diagnosis present

## 2016-03-10 DIAGNOSIS — E039 Hypothyroidism, unspecified: Secondary | ICD-10-CM | POA: Diagnosis present

## 2016-03-10 DIAGNOSIS — I5032 Chronic diastolic (congestive) heart failure: Secondary | ICD-10-CM

## 2016-03-10 DIAGNOSIS — Z801 Family history of malignant neoplasm of trachea, bronchus and lung: Secondary | ICD-10-CM | POA: Diagnosis not present

## 2016-03-10 DIAGNOSIS — G8929 Other chronic pain: Secondary | ICD-10-CM | POA: Diagnosis present

## 2016-03-10 DIAGNOSIS — Z9981 Dependence on supplemental oxygen: Secondary | ICD-10-CM | POA: Diagnosis not present

## 2016-03-10 DIAGNOSIS — J441 Chronic obstructive pulmonary disease with (acute) exacerbation: Principal | ICD-10-CM

## 2016-03-10 DIAGNOSIS — E785 Hyperlipidemia, unspecified: Secondary | ICD-10-CM | POA: Diagnosis present

## 2016-03-10 DIAGNOSIS — I252 Old myocardial infarction: Secondary | ICD-10-CM | POA: Diagnosis not present

## 2016-03-10 DIAGNOSIS — D638 Anemia in other chronic diseases classified elsewhere: Secondary | ICD-10-CM

## 2016-03-10 DIAGNOSIS — R911 Solitary pulmonary nodule: Secondary | ICD-10-CM | POA: Diagnosis present

## 2016-03-10 DIAGNOSIS — IMO0001 Reserved for inherently not codable concepts without codable children: Secondary | ICD-10-CM | POA: Insufficient documentation

## 2016-03-10 DIAGNOSIS — I110 Hypertensive heart disease with heart failure: Secondary | ICD-10-CM | POA: Diagnosis present

## 2016-03-10 DIAGNOSIS — Z87891 Personal history of nicotine dependence: Secondary | ICD-10-CM | POA: Diagnosis not present

## 2016-03-10 DIAGNOSIS — R0600 Dyspnea, unspecified: Secondary | ICD-10-CM | POA: Diagnosis present

## 2016-03-10 DIAGNOSIS — K219 Gastro-esophageal reflux disease without esophagitis: Secondary | ICD-10-CM

## 2016-03-10 DIAGNOSIS — F411 Generalized anxiety disorder: Secondary | ICD-10-CM | POA: Diagnosis present

## 2016-03-10 DIAGNOSIS — F3170 Bipolar disorder, currently in remission, most recent episode unspecified: Secondary | ICD-10-CM

## 2016-03-10 DIAGNOSIS — D509 Iron deficiency anemia, unspecified: Secondary | ICD-10-CM | POA: Diagnosis present

## 2016-03-10 DIAGNOSIS — E119 Type 2 diabetes mellitus without complications: Secondary | ICD-10-CM | POA: Diagnosis present

## 2016-03-10 DIAGNOSIS — E038 Other specified hypothyroidism: Secondary | ICD-10-CM

## 2016-03-10 DIAGNOSIS — Z981 Arthrodesis status: Secondary | ICD-10-CM | POA: Diagnosis not present

## 2016-03-10 DIAGNOSIS — Z794 Long term (current) use of insulin: Secondary | ICD-10-CM

## 2016-03-10 DIAGNOSIS — Z8249 Family history of ischemic heart disease and other diseases of the circulatory system: Secondary | ICD-10-CM | POA: Diagnosis not present

## 2016-03-10 DIAGNOSIS — I050 Rheumatic mitral stenosis: Secondary | ICD-10-CM | POA: Diagnosis present

## 2016-03-10 DIAGNOSIS — I10 Essential (primary) hypertension: Secondary | ICD-10-CM

## 2016-03-10 LAB — CBG MONITORING, ED: Glucose-Capillary: 499 mg/dL — ABNORMAL HIGH (ref 65–99)

## 2016-03-10 LAB — BASIC METABOLIC PANEL
Anion gap: 12 (ref 5–15)
BUN: 25 mg/dL — AB (ref 6–20)
CALCIUM: 8.2 mg/dL — AB (ref 8.9–10.3)
CHLORIDE: 85 mmol/L — AB (ref 101–111)
CO2: 33 mmol/L — AB (ref 22–32)
CREATININE: 0.94 mg/dL (ref 0.44–1.00)
GFR calc Af Amer: >60 mL/min (ref >60)
GFR calc non Af Amer: >60 mL/min (ref >60)
GLUCOSE: 499 mg/dL — AB (ref 65–99)
Potassium: 4.7 mmol/L (ref 3.5–5.1)
Sodium: 130 mmol/L — ABNORMAL LOW (ref 135–145)

## 2016-03-10 LAB — GLUCOSE, CAPILLARY
GLUCOSE-CAPILLARY: 343 mg/dL — AB (ref 65–99)
Glucose-Capillary: 514 mg/dL — ABNORMAL HIGH (ref 65–99)
Glucose-Capillary: 324 mg/dL — ABNORMAL HIGH (ref 65–99)
Glucose-Capillary: 279 mg/dL — ABNORMAL HIGH (ref 65–99)
Glucose-Capillary: 260 mg/dL — ABNORMAL HIGH (ref 65–99)
Glucose-Capillary: 196 mg/dL — ABNORMAL HIGH (ref 65–99)

## 2016-03-10 LAB — VITAMIN B12: VITAMIN B 12: 442 pg/mL (ref 180–914)

## 2016-03-10 LAB — EXPECTORATED SPUTUM ASSESSMENT W REFEX TO RESP CULTURE

## 2016-03-10 LAB — IRON AND TIBC
Iron: 16 ug/dL — ABNORMAL LOW (ref 28–170)
Saturation Ratios: 4 % — ABNORMAL LOW (ref 10.4–31.8)
TIBC: 385 ug/dL (ref 250–450)
UIBC: 369 ug/dL

## 2016-03-10 LAB — EXPECTORATED SPUTUM ASSESSMENT W GRAM STAIN, RFLX TO RESP C

## 2016-03-10 LAB — FERRITIN: Ferritin: 24 ng/mL (ref 11–307)

## 2016-03-10 MED ORDER — SIMVASTATIN 20 MG PO TABS
10.0000 mg | ORAL_TABLET | Freq: Every day | ORAL | Status: DC
  Administered 2016-03-10 – 2016-03-13 (×4): 10 mg via ORAL
  Filled 2016-03-10 (×5): qty 1

## 2016-03-10 MED ORDER — POTASSIUM CHLORIDE CRYS ER 20 MEQ PO TBCR
20.0000 meq | EXTENDED_RELEASE_TABLET | Freq: Every day | ORAL | Status: DC
  Administered 2016-03-10 – 2016-03-11 (×2): 20 meq via ORAL
  Filled 2016-03-10 (×2): qty 1

## 2016-03-10 MED ORDER — FUROSEMIDE 10 MG/ML IJ SOLN
40.0000 mg | Freq: Two times a day (BID) | INTRAMUSCULAR | Status: DC
  Administered 2016-03-10 – 2016-03-13 (×6): 40 mg via INTRAVENOUS
  Filled 2016-03-10 (×6): qty 4

## 2016-03-10 MED ORDER — ACETAMINOPHEN 325 MG PO TABS
650.0000 mg | ORAL_TABLET | Freq: Four times a day (QID) | ORAL | Status: DC | PRN
Start: 2016-03-10 — End: 2016-03-13

## 2016-03-10 MED ORDER — FUROSEMIDE 40 MG PO TABS
40.0000 mg | ORAL_TABLET | Freq: Two times a day (BID) | ORAL | Status: DC
  Administered 2016-03-10: 40 mg via ORAL
  Filled 2016-03-10: qty 1

## 2016-03-10 MED ORDER — LORATADINE 10 MG PO TABS
10.0000 mg | ORAL_TABLET | Freq: Every day | ORAL | Status: DC
  Administered 2016-03-10 – 2016-03-13 (×4): 10 mg via ORAL
  Filled 2016-03-10 (×4): qty 1

## 2016-03-10 MED ORDER — ACETAMINOPHEN 650 MG RE SUPP: 650.0000 mg | Freq: Four times a day (QID) | RECTAL | Status: DC | PRN

## 2016-03-10 MED ORDER — SODIUM CHLORIDE 0.9 % IV SOLN
INTRAVENOUS | Status: DC
  Administered 2016-03-10: 01:00:00 via INTRAVENOUS

## 2016-03-10 MED ORDER — INSULIN GLARGINE 100 UNIT/ML ~~LOC~~ SOLN
45.0000 [IU] | Freq: Every day | SUBCUTANEOUS | Status: DC
  Administered 2016-03-10 – 2016-03-12 (×4): 45 [IU] via SUBCUTANEOUS
  Filled 2016-03-10 (×4): qty 0.45

## 2016-03-10 MED ORDER — IPRATROPIUM-ALBUTEROL 0.5-2.5 (3) MG/3ML IN SOLN: 3.0000 mL | RESPIRATORY_TRACT | Status: DC | PRN

## 2016-03-10 MED ORDER — ENOXAPARIN SODIUM 40 MG/0.4ML ~~LOC~~ SOLN
40.0000 mg | SUBCUTANEOUS | Status: DC
  Administered 2016-03-10 – 2016-03-13 (×4): 40 mg via SUBCUTANEOUS
  Filled 2016-03-10 (×5): qty 0.4

## 2016-03-10 MED ORDER — LOSARTAN POTASSIUM 50 MG PO TABS
50.0000 mg | ORAL_TABLET | Freq: Every day | ORAL | Status: DC
  Administered 2016-03-10 – 2016-03-13 (×4): 50 mg via ORAL
  Filled 2016-03-10 (×5): qty 1

## 2016-03-10 MED ORDER — INSULIN ASPART 100 UNIT/ML ~~LOC~~ SOLN
0.0000 [IU] | Freq: Three times a day (TID) | SUBCUTANEOUS | Status: DC
  Administered 2016-03-10: 11 [IU] via SUBCUTANEOUS
  Administered 2016-03-10: 4 [IU] via SUBCUTANEOUS
  Administered 2016-03-11 (×2): 7 [IU] via SUBCUTANEOUS
  Administered 2016-03-11: 20 [IU] via SUBCUTANEOUS
  Administered 2016-03-12: 15 [IU] via SUBCUTANEOUS
  Administered 2016-03-12: 4 [IU] via SUBCUTANEOUS
  Administered 2016-03-12: 7 [IU] via SUBCUTANEOUS
  Administered 2016-03-13: 11 [IU] via SUBCUTANEOUS

## 2016-03-10 MED ORDER — INSULIN ASPART 100 UNIT/ML ~~LOC~~ SOLN
0.0000 [IU] | Freq: Three times a day (TID) | SUBCUTANEOUS | Status: DC
  Administered 2016-03-10: 11 [IU] via SUBCUTANEOUS

## 2016-03-10 MED ORDER — DIVALPROEX SODIUM 250 MG PO DR TAB
250.0000 mg | DELAYED_RELEASE_TABLET | Freq: Two times a day (BID) | ORAL | Status: DC
  Administered 2016-03-10 – 2016-03-13 (×8): 250 mg via ORAL
  Filled 2016-03-10 (×10): qty 1

## 2016-03-10 MED ORDER — LEVOFLOXACIN IN D5W 750 MG/150ML IV SOLN
750.0000 mg | INTRAVENOUS | Status: DC
  Administered 2016-03-10 – 2016-03-13 (×4): 750 mg via INTRAVENOUS
  Filled 2016-03-10 (×4): qty 150

## 2016-03-10 MED ORDER — INSULIN ASPART 100 UNIT/ML ~~LOC~~ SOLN
10.0000 [IU] | Freq: Once | SUBCUTANEOUS | Status: AC
  Administered 2016-03-10: 10 [IU] via SUBCUTANEOUS

## 2016-03-10 MED ORDER — ARIPIPRAZOLE 5 MG PO TABS
5.0000 mg | ORAL_TABLET | Freq: Every day | ORAL | Status: DC
  Administered 2016-03-10 – 2016-03-13 (×4): 5 mg via ORAL
  Filled 2016-03-10 (×4): qty 1

## 2016-03-10 MED ORDER — RACEPINEPHRINE HCL 2.25 % IN NEBU
0.5000 mL | INHALATION_SOLUTION | Freq: Once | RESPIRATORY_TRACT | Status: AC
  Administered 2016-03-10: 0.5 mL via RESPIRATORY_TRACT
  Filled 2016-03-10: qty 0.5

## 2016-03-10 MED ORDER — METOPROLOL TARTRATE 25 MG PO TABS
12.5000 mg | ORAL_TABLET | Freq: Two times a day (BID) | ORAL | Status: DC
  Administered 2016-03-10 – 2016-03-13 (×7): 12.5 mg via ORAL
  Filled 2016-03-10 (×8): qty 1

## 2016-03-10 MED ORDER — POLYETHYLENE GLYCOL 3350 17 G PO PACK
17.0000 g | PACK | Freq: Every day | ORAL | Status: DC | PRN
  Filled 2016-03-10: qty 1

## 2016-03-10 MED ORDER — PANTOPRAZOLE SODIUM 40 MG PO TBEC
40.0000 mg | DELAYED_RELEASE_TABLET | Freq: Every day | ORAL | Status: DC
  Administered 2016-03-10 – 2016-03-13 (×4): 40 mg via ORAL
  Filled 2016-03-10 (×4): qty 1

## 2016-03-10 MED ORDER — ASPIRIN EC 81 MG PO TBEC
81.0000 mg | DELAYED_RELEASE_TABLET | Freq: Every day | ORAL | Status: DC
  Administered 2016-03-10 – 2016-03-13 (×4): 81 mg via ORAL
  Filled 2016-03-10 (×5): qty 1

## 2016-03-10 MED ORDER — BISACODYL 5 MG PO TBEC
5.0000 mg | DELAYED_RELEASE_TABLET | Freq: Every day | ORAL | Status: DC | PRN
  Filled 2016-03-10: qty 1

## 2016-03-10 MED ORDER — ONDANSETRON HCL 4 MG/2ML IJ SOLN: 4.0000 mg | Freq: Four times a day (QID) | INTRAMUSCULAR | Status: DC | PRN

## 2016-03-10 MED ORDER — LEVOTHYROXINE SODIUM 25 MCG PO TABS
125.0000 ug | ORAL_TABLET | Freq: Every day | ORAL | Status: DC
  Administered 2016-03-10 – 2016-03-13 (×4): 125 ug via ORAL
  Filled 2016-03-10 (×6): qty 1

## 2016-03-10 MED ORDER — ONDANSETRON HCL 4 MG PO TABS
4.0000 mg | ORAL_TABLET | Freq: Four times a day (QID) | ORAL | Status: DC | PRN
Start: 2016-03-10 — End: 2016-03-13

## 2016-03-10 MED ORDER — METHYLPREDNISOLONE SODIUM SUCC 125 MG IJ SOLR
60.0000 mg | Freq: Four times a day (QID) | INTRAMUSCULAR | Status: DC
  Administered 2016-03-10 – 2016-03-12 (×9): 60 mg via INTRAVENOUS
  Filled 2016-03-10 (×9): qty 2
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
## 2016-03-10 NOTE — Progress Notes (Signed)
PROGRESS NOTE  Elizabeth BuryMary J [name]  ZOX:[id number] DOB: December 28, 1960 DOA: 03/09/2016 PCP: Dorrene GermanEdwin A Avbuere, MD Outpatient Specialists:  Subjective: Still complaining about dyspnea, seen with nursing staff at bedside. Patient was really anxious and when I saw her she was trying to go to the bathroom. She was not short of breath or wheezy but she is anxious that I will let her go home.  Brief Narrative:  55 year old with COPD, on 3 L of home oxygen, she, as chronic diastolic CHF and generalized anxiety disorder, she was discharged from the hospital on 03/08/2016 and came in back less than 24 hours. Please note that she has 7 admissions to the hospital with respiratory difficulty/distress in the past 6 months.  Assessment & Plan:   Principal Problem:   COPD exacerbation (HCC) Active Problems:   Diabetes mellitus, insulin dependent (IDDM), uncontrolled (HCC)   HTN (hypertension)   Generalized anxiety disorder   Hypothyroidism   GERD without esophagitis   Chronic diastolic heart failure (HCC)   Anemia of chronic disease   Bipolar affective disorder in remission (HCC)   Dyslipidemia   COPD with exacerbation (HCC)   This is a no charge note, patient seen earlier today by my colleague Dr. Bluford Kaufmannoh. Patient with COPD exacerbation discharged on the 6/16th and back to the hospital to send 24 hours after discharge. [name] complaining about she can breathe, oxygen saturation is about 100% on 3 L of oxygen. Patient has wheezing from upper airways, cannot rule out VCD, she will need pulmonology follow-up.   DVT prophylaxis:  Code Status: Full Code Family Communication:  Disposition Plan:  Diet: Diet Carb Modified Fluid consistency:: Thin; Room service appropriate?: Yes; Fluid restriction:: 1500 mL Fluid  Consultants:   None  Procedures:   None  Antimicrobials:   Levofloxacin   Objective: Filed Vitals:   03/10/16 0035 03/10/16 0048 03/10/16 0145 03/10/16 0225  BP: 160/60 136/56  148/70   Pulse: 93 85 83 83  Temp:    98.1 F (36.7 C)  TempSrc:    Oral  Resp: 23 19 18 26   Height:    4\' 9"  (1.448 m)  Weight:    83.8 kg (184 lb 11.9 oz)  SpO2: 97% 98% 100% 100%    Intake/Output Summary (Last 24 hours) at 03/10/16 1022 Last data filed at 03/10/16 0845  Gross per 24 hour  Intake    885 ml  Output    400 ml  Net    485 ml   Filed Weights   03/10/16 0225  Weight: 83.8 kg (184 lb 11.9 oz)    Examination: General exam: Appears calm and comfortable  Respiratory system: Clear to auscultation. Respiratory effort normal. Cardiovascular system: S1 & S2 heard, RRR. No JVD, murmurs, rubs, gallops or clicks. No pedal edema. Gastrointestinal system: Abdomen is nondistended, soft and nontender. No organomegaly or masses felt. Normal bowel sounds heard. Central nervous system: Alert and oriented. No focal neurological deficits. Extremities: Symmetric 5 x 5 power. Skin: No rashes, lesions or ulcers Psychiatry: Judgement and insight appear normal. Mood & affect appropriate.   Data Reviewed: I have personally reviewed following labs and imaging studies  CBC:  Recent Labs Lab 03/04/16 0311 03/05/16 0238 03/06/16 0330 03/07/16 0619 03/09/16 1834  WBC 9.6 8.8 8.4 10.4 13.8*  NEUTROABS  --   --   --   --  11.4*  HGB 7.7* 8.0* 8.3* 8.5* 9.2*  HCT 26.9* 28.4* 29.7* 30.0* 30.7*  MCV 76.2* 75.9* 75.0* 76.1* 74.9*  PLT 378 435* 379 390 344   Basic Metabolic Panel:  Recent Labs Lab 03/05/16 0238 03/06/16 0330 03/07/16 0619 03/09/16 1834 03/10/16 0219  NA 140 135 138 141 130*  K 3.6 4.9 4.2 3.5 4.7  CL 93* 92* 90* 91* 85*  CO2 37* 36* 39* 38* 33*  GLUCOSE 44* 415* 256* 281* 499*  BUN 22* 25* 23* 25* 25*  CREATININE 0.85 0.97 0.86 0.92 0.94  CALCIUM 9.0 8.8* 9.4 8.9 8.2*   GFR: Estimated Creatinine Clearance: 59.8 mL/min (by C-G formula based on Cr of 0.94). Liver Function Tests: No results for input(s): AST, ALT, ALKPHOS, BILITOT, PROT, ALBUMIN in the last 168  hours. No results for input(s): LIPASE, AMYLASE in the last 168 hours. No results for input(s): AMMONIA in the last 168 hours. Coagulation Profile: No results for input(s): INR, PROTIME in the last 168 hours. Cardiac Enzymes: No results for input(s): CKTOTAL, CKMB, CKMBINDEX, TROPONINI in the last 168 hours. BNP (last 3 results) No results for input(s): PROBNP in the last 8760 hours. HbA1C: No results for input(s): HGBA1C in the last 72 hours. CBG:  Recent Labs Lab 03/08/16 0634 03/08/16 1215 03/10/16 0141 03/10/16 0310 03/10/16 0730  GLUCAP 304* 185* 499* 514* 324*   Lipid Profile: No results for input(s): CHOL, HDL, LDLCALC, TRIG, CHOLHDL, LDLDIRECT in the last 72 hours. Thyroid Function Tests: No results for input(s): TSH, T4TOTAL, FREET4, T3FREE, THYROIDAB in the last 72 hours. Anemia Panel: No results for input(s): VITAMINB12, FOLATE, FERRITIN, TIBC, IRON, RETICCTPCT in the last 72 hours. Urine analysis:    Component Value Date/Time   COLORURINE YELLOW 03/02/2016 0914   APPEARANCEUR CLEAR 03/02/2016 0914   LABSPEC 1.008 03/02/2016 0914   PHURINE 6.5 03/02/2016 0914   GLUCOSEU NEGATIVE 03/02/2016 0914   HGBUR NEGATIVE 03/02/2016 0914   BILIRUBINUR NEGATIVE 03/02/2016 0914   KETONESUR NEGATIVE 03/02/2016 0914   PROTEINUR NEGATIVE 03/02/2016 0914   UROBILINOGEN 0.2 07/29/2015 1948   NITRITE NEGATIVE 03/02/2016 0914   LEUKOCYTESUR NEGATIVE 03/02/2016 0914   Sepsis Labs: (procalcitonin:4,lacticidven:4)  ) Recent Results (from the past 240 hour(s))  Culture, sputum-assessment     Status: None   Collection Time: 03/10/16  7:32 AM  Result Value Ref Range Status   Specimen Description SPUTUM  Final   Special Requests NONE  Final   Sputum evaluation   Final    THIS SPECIMEN IS ACCEPTABLE. RESPIRATORY CULTURE REPORT TO FOLLOW.   Report Status 03/10/2016 FINAL  Final     Invalid input(s): PROCALCITONIN, LACTICACIDVEN   Radiology Studies: Dg Chest 2  View  03/09/2016  CLINICAL DATA:  C/o SOB. Pt has history of COPD and anxiety. H/o HTN, MI in 2007, emphysema, COPD, heart murmur, type II diabetes, CHF. Former smoker. EXAM: CHEST  2 VIEW COMPARISON:  03/02/2016 FINDINGS: Stable mild cardiac enlargement. Mild vascular congestion. Mildly increased markings in the perihilar regions with focal mild left mid lung infiltrate. No pleural effusion. IMPRESSION: Significant improvement with largely resolved pulmonary edema. Left mid lung infiltrate could represent residual edema, atelectasis, or pneumonia. Based on the appearance on the lateral view atelectasis is favored. Radiographic follow-up recommended. Electronically Signed   By: Esperanza Heir M.D.   On: 03/09/2016 17:11   Ct Angio Chest Pe W/cm &/or Wo Cm  03/09/2016  CLINICAL DATA:  Shortness of breath, elevated D-dimer. EXAM: CT ANGIOGRAPHY CHEST WITH CONTRAST TECHNIQUE: Multidetector CT imaging of the chest was performed using the standard protocol during bolus administration of intravenous contrast. Multiplanar CT image reconstructions  and MIPs were obtained to evaluate the vascular anatomy. CONTRAST:  100 cc Isovue 370 IV COMPARISON:  Chest radiographs earlier this day. Chest CT 1 week prior 03/02/2016 FINDINGS: There are no filling defects within the pulmonary arteries to suggest pulmonary embolus. Motion artifact limits the mid and lower lung zone evaluation, however no findings suspicious for central pulmonary embolus. Thoracic aorta is normal in caliber. No aortic dissection. Stable cardiomegaly. Coronary artery calcifications again seen. Minimal pericardial thickening is unchanged from prior exam. Previous enlarged right lower paratracheal node has decreased in size currently 7 mm. No new mediastinal or hilar adenopathy. 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 Tracheobronchial secretions have minimally increased from recent prior. Mild emphysema. Linear and ground-glass opacities  throughout both lungs with macro mosaic attenuation, not significantly changed from prior. The previous 4 mm nodule in the superior segment of the left lower lobe currently measures 3 mm and has decreased in size image 36 series 11. More inferiorly in the left lower lobe there is a 4 mm nodule that was previously obscured image 47 series 11. Previous pleural effusions have resolved. No acute abnormality in the included upper abdomen. The esophagus is patulous and dilated proximally. Postsurgical change in the right and left proximal humerus. Compression deformity of T12 vertebral body with associated vacuum phenomenon is unchanged. Bones are under mineralized. There are no acute or suspicious osseous abnormalities. Review of the MIP images confirms the above findings. IMPRESSION: 1. No pulmonary embolus. 2. 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 Minimal increase in tracheobronchial secretions from prior. 3. Unchanged appearance of the lung parenchyma with multifocal patchy, linear, and ground-glass opacities. This likely represents combination of atelectasis, sequela of aspiration and possibly minimal pulmonary edema. 4. Left lower lobe pulmonary nodules, largest measuring 4 mm. No follow-up needed if patient is low-risk (and has no known or suspected primary neoplasm). Non-contrast chest CT can be considered in 12 months if patient is high-risk. This recommendation follows the consensus statement: Guidelines for Management of Incidental Pulmonary Nodules Detected on CT Images:From the Fleischner Society 2017; published online before print (10.1148/radiol.8295621308). 5. Additional stable chronic findings as described. Electronically Signed   By: Rubye Oaks M.D.   On: 03/09/2016 20:23        Scheduled Meds: . ARIPiprazole  5 mg Oral Daily  . aspirin EC  81 mg Oral Daily  . divalproex  250 mg Oral Q12H  . enoxaparin (LOVENOX) injection  40 mg Subcutaneous Q24H  . furosemide  40 mg  Oral BID  . insulin aspart  0-15 Units Subcutaneous TID WC  . insulin glargine  45 Units Subcutaneous QHS  . levofloxacin (LEVAQUIN) IV  750 mg Intravenous Q24H  . levothyroxine  125 mcg Oral QAC breakfast  . loratadine  10 mg Oral Daily  . losartan  50 mg Oral Daily  . methylPREDNISolone (SOLU-MEDROL) injection  60 mg Intravenous Q6H  . metoprolol tartrate  12.5 mg Oral BID  . pantoprazole  40 mg Oral Daily  . potassium chloride SA  20 mEq Oral Daily  . simvastatin  10 mg Oral Daily   Continuous Infusions:       Time spent: 35 minutes    Gillie Crisci A, MD [facility] Pager [phone]  If 7PM-7AM, please contact night-coverage [email or web address] Password [email or web address] 03/10/2016, 10:22 AM

## 2016-03-10 NOTE — Clinical Social Work Note (Signed)
CSW received referral for SNF.  PT recommending home health, CSW notified case manager, and plan is to discharge home with home health.  CSW to sign off please re-consult if social work needs arise.  Ervin KnackEric R. Marquarius Lofton, MSW, [employer]CSWA [phone]

## 2016-03-10 NOTE — ED Notes (Signed)
CBG 499

## 2016-03-10 NOTE — ED Provider Notes (Signed)
Patient accepted in sign out pending d-dimer and reevaluation. D-dimer elevated and CTA obtained. CTA negative for PE and without overt infectious process and similar appearing lung parenchyma.  Patient continued to be short of breath and intermittently hypoxic. Patient with significant history of COPD. Case with Dr. Antionette Charpyd who agreed with admission.  Leta BaptistEmily Roe Shalia Bartko, MD 03/10/16 [phone]

## 2016-03-10 NOTE — Evaluation (Signed)
Physical Therapy Evaluation Patient Details Name: [name] MRN: [id number] DOB: [birth year] Today's Date: 03/10/2016   History of Present Illness  55 year-old was  discharged from the hospital on 03/08/2016 and came in back less than 24 hours HX: COPD, on 3 L of home oxygen, chronic diastolic CHF and generalized anxiety disorder, bipolar, HTN, substance, back surgery x3, chronic pain;  Please note that she has 7 admissions to the hospital with respiratory difficulty/distress in the past 6 months.  Clinical Impression  Pt admitted with above diagnosis. Pt currently with functional limitations due to the deficits listed below (see PT Problem List).  Pt will benefit from skilled PT to increase their independence and safety with mobility to allow discharge to the venue listed below.  Will follow, HHPT vs no f/u depending on progress; pt has HH aide 7d/wk at home     Follow Up Recommendations Home health PT;No PT follow up (pt has Medicaid)    Equipment Recommendations  None recommended by PT    Recommendations for Other Services       Precautions / Restrictions Precautions Precautions: Fall Precaution Comments: monitor O2      Mobility  Bed Mobility Overal bed mobility: Needs Assistance Bed Mobility: Supine to Sit;Sit to Supine     Supine to sit: HOB elevated;Modified independent (Device/Increase time) Sit to supine: Modified independent (Device/Increase time)   General bed mobility comments: HOB elevated and pt uses bed rail for supine>sit.  No physical assist needed. has hospital bed at home  Transfers Overall transfer level: Needs assistance Equipment used: Rolling walker (2 wheeled) Transfers: Sit to/from Stand Sit to Stand: Min guard         General transfer comment: Min guard assist for safety.    Ambulation/Gait Ambulation/Gait assistance: Min guard Ambulation Distance (Feet): 45 Feet Assistive device: Rolling walker (2 wheeled) Gait Pattern/deviations:  Step-through pattern;Trunk flexed     General Gait Details: Flexed posture and pt fatigues quickly.  Cues for pursed lip breathing and awareness of fatigue; O2 sats 97% at rest, 100% after amb on 3L O2 throughout, HR 70s-80s  Pt becomes very anxious once out in hallway about back pain.  Stairs            Wheelchair Mobility    Modified Rankin (Stroke Patients Only)       Balance Overall balance assessment: Needs assistance   Sitting balance-Leahy Scale: Good       Standing balance-Leahy Scale: Poor Standing balance comment: reliant on UEs for support                             Pertinent Vitals/Pain Pain Assessment: Faces Faces Pain Scale: Hurts little more Pain Location: back Pain Descriptors / Indicators: Aching;Grimacing    Home Living Family/patient expects to be discharged to:: Private residence Living Arrangements: Spouse/significant other;Children Available Help at Discharge: Family;Available 24 hours/day Type of Home: Apartment Home Access: Level entry     Home Layout: One level Home Equipment: Walker - 2 wheels;Cane - single point;Shower seat;Grab bars - tub/shower;Hand held shower head;Wheelchair - Careers advisermanual;Other (comment);Bedside commode;Grab bars - toilet;Hospital bed (hemiwalker)      Prior Function Level of Independence: Needs assistance   Gait / Transfers Assistance Needed: Pt was ambulating short distances in home.   Husband pushes [facility] in [facility] in community  ADL's / Homemaking Assistance Needed: has HH aide that assists with bathing/dressing, laundry and meals 7x/week for 3-4 hours.  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  Scar Lt knee.  Strength grossly 4/5  Cervical / Trunk Assessment: Other exceptions  Communication   Communication: No difficulties  Cognition Arousal/Alertness: Awake/alert Behavior During Therapy: WFL for tasks assessed/performed Overall Cognitive Status: Within Functional Limits for tasks assessed                      General Comments      Exercises        Assessment/Plan    PT Assessment Patient needs continued PT services  PT Diagnosis Difficulty walking   PT Problem List Decreased range of motion;Decreased activity tolerance;Decreased balance;Decreased knowledge of use of DME;Decreased safety awareness;Cardiopulmonary status limiting activity;Pain  PT Treatment Interventions DME instruction;Gait training;Functional mobility training;Therapeutic activities;Therapeutic exercise;Balance training;Patient/family education;Modalities   PT Goals (Current goals can be found in the Care Plan section) Acute Rehab PT Goals Patient Stated Goal: none stated PT Goal Formulation: With patient Time For Goal Achievement: 03/17/16 Potential to Achieve Goals: Good    Frequency Min 3X/week   Barriers to discharge        Co-evaluation               End of Session Equipment Utilized During Treatment: Gait belt;Oxygen Activity Tolerance: Patient limited by fatigue;Patient limited by pain Patient left: in bed;with call bell/phone within reach;with bed alarm set      Functional Assessment Tool Used: clinical judgement Functional Limitation: Mobility: Walking and moving around Mobility: Walking and Moving Around Current Status (Z6109): At least 1 percent but less than 20 percent impaired, limited or restricted Mobility: Walking and Moving Around Goal Status [phone]): At least 1 percent but less than 20 percent impaired,  limited or restricted    Time: 1142-1200 PT Time Calculation (min) (ACUTE ONLY): 18 min   Charges:   PT Evaluation $PT Eval Low Complexity: 1 Procedure     PT G Codes:   PT G-Codes **NOT FOR INPATIENT CLASS** Functional Assessment Tool Used: clinical judgement Functional Limitation: Mobility: Walking and moving around Mobility: Walking and Moving Around Current Status (U9811): At least 1 percent but less than 20 percent impaired, limited or restricted Mobility: Walking and Moving Around Goal Status [phone]): At least 1 percent but less than 20 percent impaired, limited or restricted    [facility] 03/10/2016, 12:29 PM

## 2016-03-11 DIAGNOSIS — E1043 Type 1 diabetes mellitus with diabetic autonomic (poly)neuropathy: Secondary | ICD-10-CM

## 2016-03-11 DIAGNOSIS — E1065 Type 1 diabetes mellitus with hyperglycemia: Secondary | ICD-10-CM

## 2016-03-11 LAB — GLUCOSE, CAPILLARY
GLUCOSE-CAPILLARY: 217 mg/dL — AB (ref 65–99)
Glucose-Capillary: 426 mg/dL — ABNORMAL HIGH (ref 65–99)
Glucose-Capillary: 247 mg/dL — ABNORMAL HIGH (ref 65–99)
Glucose-Capillary: 225 mg/dL — ABNORMAL HIGH (ref 65–99)

## 2016-03-11 LAB — BASIC METABOLIC PANEL
ANION GAP: 8 (ref 5–15)
BUN: 24 mg/dL — AB (ref 6–20)
CALCIUM: 8.9 mg/dL (ref 8.9–10.3)
CO2: 37 mmol/L — ABNORMAL HIGH (ref 22–32)
CREATININE: 0.81 mg/dL (ref 0.44–1.00)
Chloride: 89 mmol/L — ABNORMAL LOW (ref 101–111)
GFR calc Af Amer: >60 mL/min (ref >60)
GLUCOSE: 398 mg/dL — AB (ref 65–99)
Potassium: 3.6 mmol/L (ref 3.5–5.1)
Sodium: 134 mmol/L — ABNORMAL LOW (ref 135–145)

## 2016-03-11 LAB — LACTIC ACID, PLASMA
LACTIC ACID, VENOUS: 1.2 mmol/L (ref 0.5–2.0)
Lactic Acid, Venous: 1.3 mmol/L (ref 0.5–2.0)

## 2016-03-11 MED ORDER — POTASSIUM CHLORIDE CRYS ER 20 MEQ PO TBCR
60.0000 meq | EXTENDED_RELEASE_TABLET | Freq: Once | ORAL | Status: AC
  Administered 2016-03-11: 60 meq via ORAL
  Filled 2016-03-11: qty 3

## 2016-03-11 MED ORDER — PRO-STAT SUGAR FREE PO LIQD
30.0000 mL | Freq: Two times a day (BID) | ORAL | Status: DC
  Administered 2016-03-11 – 2016-03-12 (×4): 30 mL via ORAL
  Filled 2016-03-11 (×5): qty 30

## 2016-03-11 NOTE — Progress Notes (Addendum)
Initial Nutrition Assessment  DOCUMENTATION CODES:   Morbid obesity  INTERVENTION:  - Will order 30 mL Prostat BID, each supplement provides 100 kcal and 15 grams of protein. - Encourage PO intakes of supplements and protein foods at meals. - RD will continue to monitor for additional needs.  NUTRITION DIAGNOSIS:   Increased nutrient needs related to chronic illness as evidenced by estimated needs.  GOAL:   Patient will meet greater than or equal to 90% of their needs  MONITOR:   PO intake, Supplement acceptance, Weight trends, Labs, Skin, I & O's  REASON FOR ASSESSMENT:   Malnutrition Screening Tool, Consult Assessment of nutrition requirement/status (COPD protocol)  ASSESSMENT:   55 y.o. female with medical history significant for COPD on home oxygen, chronic diastolic CHF, generalized anxiety disorder, bipolar 1 disorder, hypertension, insulin-dependent diabetes mellitus, and hypothyroidism who presents to the ED for evaluation of dyspnea and cough. Patient is chronically ill and has had increasingly frequent hospitalizations for respiratory problems. In fact, she was just discharged the day prior to her presentation following management for dyspnea which was attributed to acute on chronic diastolic CHF and COPD with acute exacerbation. Patient was diuresed during this admission, treated with DuoNeb and steroids, made a good recovery, and was discharged home on 03/08/2016 and much improved and stable condition. Unfortunately, back at home, patient's dyspnea worsened significantly overnight and was not relieved by using her albuterol neb 3. She called EMS and DuoNeb was administered en route to the hospital 2. Patient denies orthopnea or lower extremity edema at this time. She does endorse a new cough, occasionally productive of thick white sputum. She denies chest pain or palpitations, but notes severe dyspnea, worse with minimal exertion. She has been using supplemental oxygen  around the clock and was discharged home with a prednisone taper, but has not yet picked this up from the pharmacy.  Pt seen for MST and consult/protocol. BMI indicates morbid obesity. Per chart review, pt ate 70% of breakfast and 100% of lunch and dinner yesterday. Pt reports 100% completion of breakfast (eggs, bacon, grits, and orange juice) this AM and states that lunch to arrive at 1230. She does not have any teeth or dentures and only orders foods that she knows she will be able to chew without issue. She denies any swallowing difficulties.   Pt very tearful during visit related to wanting to go home, recent hospitalizations, and SOB. She states that SOB does become worse with talking and eating since admission which is unusual for her. Physical assessment shows no muscle or fat wasting. Mild edema to extremities which pt reports is usual for her d/t hx of CHF.   Pt meeting kcal needs, unsure if she is meeting protein needs at this time. Medications reviewed; 40 mg IV Lasix BID, 20 mEq KCl daily. Labs reviewed; CBGs: 199-514 mg/dL since 4/096/18 AM, Na: 811134 mmol/L, Cl: 89 mmol/L, BUN: 24 mg/dL.  ADDENDUM: Pt reports weight in January was 240 lbs and that she has lost weight to 185 lbs in that same month. She reports after that time weight has been stable. Per chart review, pt gained 25 lbs from 10/12/15-01/11/16. Since 01/11/16 she has subsequently lost 43 lbs (19% body weight) which is significant for time frame. Will need to continue to monitor weight trends as weight fluctuations may be fluid related given hx of CHF per pt report.   Diet Order:  Diet Carb Modified Fluid consistency:: Thin; Room service appropriate?: Yes; Fluid restriction:: 1500 mL Fluid  Skin:  Wound (see comment) (MSAD bilateral buttocks)  Last BM:  6/18  Height:   Ht Readings from Last 1 Encounters:  03/10/16  (1.448 m)    Weight:   Wt Readings from Last 1 Encounters:  03/11/16 181 lb (82.1 kg)    Ideal Body  Weight:  41.45 kg (kg)  BMI:  Body mass index is 39.16 kg/(m^2).  Estimated Nutritional Needs:   Kcal:  1475-1725 (18-21 kcal/kg)  Protein:  82-98 grams (1-1.2 grams/kg or 2-2.4 grams/kg IBW))  Fluid:  1.5 L/day  EDUCATION NEEDS:   No education needs identified at this time     Trenton Gammon, MS, RD, LDN Inpatient Clinical Dietitian Pager # [phone] After hours/weekend pager # [phone]

## 2016-03-11 NOTE — Evaluation (Signed)
Occupational Therapy Evaluation Patient Details Name: [name] MRN: [id number] DOB: 1961-01-12 Today's Date: 03/11/2016    History of Present Illness 55 year-old was  discharged from the hospital on 03/08/2016 and came in back less than 24 hours HX: COPD, on 3 L of home oxygen, chronic diastolic CHF and generalized anxiety disorder, bipolar, HTN, substance, back surgery x3, chronic pain;  Please note that she has 7 admissions to the hospital with respiratory difficulty/distress in the past 6 months.   Clinical Impression   Pt admitted with COPD. Pt currently with functional limitations due to the deficits listed below (see OT Problem List).  Pt will benefit from skilled OT to increase their safety and independence with ADL and functional mobility for ADL to facilitate discharge to venue listed below.     Follow Up Recommendations  SNF;Supervision/Assistance - 24 hour;Other (comment) (pt hasbeen to hospital 7 times in 6 monthes- maybe increased level of care.)          Precautions / Restrictions Precautions Precautions: Fall Precaution Comments: monitor O2      Mobility Bed Mobility Overal bed mobility: Needs Assistance Bed Mobility: Supine to Sit;Sit to Supine     Supine to sit: HOB elevated;Min assist Sit to supine: Min assist   General bed mobility comments: HOB elevated and pt uses bed rail for supine>sit.  No physical assist needed.  Transfers Overall transfer level: Needs assistance Equipment used: 1 person hand held assist Transfers: Sit to/from [employer] Sit to Stand: Min assist Stand pivot transfers: Min assist       General transfer comment: bed to [facility]         ADL Overall ADL's : Needs assistance/impaired                     Lower Body Dressing: Moderate assistance;Sit to/from stand;Cueing for sequencing;Cueing for safety Lower Body Dressing Details (indicate cue type and reason): donning and doffing socks Toilet  Transfer: Minimal assistance;BSC;Stand-pivot;Cueing for sequencing       Tub/ Shower Transfer: Cueing for safety;Total assistance     General ADL Comments: pt agreed to [facility].  Pt wanted to get back to bed .                 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  Scar Lt knee.  Strength grossly 4/5   Cervical / Trunk Assessment Cervical / Trunk Assessment: Other exceptions Cervical / Trunk Exceptions: extensive h/o back surgery w/ chronic back pain   Communication Communication Communication: No difficulties   Cognition Arousal/Alertness: Awake/alert Behavior During Therapy: WFL for tasks assessed/performed;Anxious Overall Cognitive Status: Within Functional Limits for tasks assessed                                Home Living Family/patient expects to be discharged to:: Private residence Living  Arrangements: Spouse/significant other;Children Available Help at Discharge: Family;Available 24 hours/day Type of Home: Apartment Home Access: Level entry     Home Layout: One level     Bathroom Shower/Tub: Tub/shower unit         Home Equipment: Environmental consultantWalker - 2 wheels;Cane - single point;Shower seat;Grab bars - tub/shower;Hand held shower head;Wheelchair - Careers advisermanual;Other (comment);Bedside commode;Grab bars - toilet (hemiwalker)           Prior Functioning/Environment Level of Independence: Needs assistance  Gait / Transfers Assistance Needed: Pt was ambulating short distances in home.   [facility] ADL's / Homemaking Assistance Needed: has HH aide that assists with bathing/dressing, laundry and meals 7x/week for 3-4 hours.    Comments: used tub transfer bench for showering    OT Diagnosis: Generalized weakness   OT Problem List: Decreased strength   OT Treatment/Interventions: Self-care/ADL training;Patient/family education    OT Goals(Current goals can be found in the care plan section) Acute Rehab OT Goals Patient Stated Goal: none stated Time For Goal Achievement: 03/25/16 Potential to Achieve Goals: Good  OT Frequency: Min 2X/week              End of Session Nurse Communication: Mobility status  Activity Tolerance: Patient limited by fatigue Patient left: in bed;with call bell/phone within reach;with bed alarm set   Time: 1015-1035 OT Time Calculation (min): 20 min Charges:  OT General Charges $OT Visit: 1 Procedure OT Evaluation $OT Eval Moderate Complexity: 1 Procedure G-Codes:    Einar CrowEDDING, Conchetta Lamia D 03/11/2016, 11:25 AM

## 2016-03-11 NOTE — Progress Notes (Signed)
PROGRESS NOTE                                                                                                                                                                                                             Patient Demographics:    [name], is a 55 y.o. female, DOB - Aug 29, 1961, ZOX:[id number]  Admit date - 03/09/2016   Admitting Physician Briscoe Deutscher, MD  Outpatient Primary MD for the patient is [name], MD  LOS - 1  Chief Complaint  Patient presents with  . Shortness of Breath       Brief Narrative   55 year old with COPD, on 3 L of home oxygen, she has chronic diastolic CHF and generalized anxiety disorder, she was discharged from the hospital on 03/08/2016 and came in back less than 24 hours. Please note that she has 7 admissions to the hospital with respiratory difficulty/distress in the past 6 months.     Subjective:   Very anxious, stating she is short of breath. Resolves when she is calm.    Assessment  & Plan :   COPD with acute exacerbation  Presented with increased cough and dyspnea. Discharged from the hospital 03/08/16 with prednisone taper, but not able to obtain. Started on DuoNeb q4h prn, Solu-Medrol 60 mg IV q6h, and Levaquin 750 mg IV q24h. Continue current treatment and pulse oximetry. SW and CM consulted, advanced home health on discharge to evaluate home equipment. Have her follow up with ENT and pulmonologist when discharged.   Chronic diastolic CHF  No evidence for acute CHF. TTE (12/26/15) with EF 60-65%, severe mitral stenosis, and grade 2 diastolic dysfunction. Continue current management of Lasix , Lopressor, losartan. Continue daily weights and I/O's, fluid-restrict diet. We'll give extra dose of IV Lasix to keep intake less than the output.  Type II DM  A1c 7.5% in April 2017, showing sub-optimal glycemic-control. Held glimepiride while in hospital, continued  basal coverage with Lantus 45 units qHS with a moderate-intensity SSI. Continue to check CBG with meals and qHS, and adjust insulins prn. Continue current management.  Hypertension  At goal, continue home medication of Lopressor and losartan.  Hypothyroidism  Stable, continue current managemnt with Synthroid 125 mcg daily   Bipolar disorder, anxiety  Appears to be stable, continue current management with Depakote, Abilify, and prn Xanax.  Her anxiety appears to be contributing to her recurrent respiratory distress symptoms.  GERD  Stable; continue PPI therapy with Protonix while in hospital.  Microcytic anemia  Hgb 9.2, MCV is 74.9 on admission. No sign of active blood-loss. Iron is 16, TIBC wnl, and Ferritin wnl suggesting anemia from chronic disease. Continue treatment of COPD exacerbation and glycemic-controlled.   LLL Pulmonary nodules: CT on 03/09/2016 shows pulmonary nodules in the LLL, the largest measuring 4mm. Follow up as outpatient with PCP.   Code Status :  Full code  Family Communication  :  None at bedside  Disposition Plan  :  Stay inpatient  Consults  :  None  Procedures  :  None  DVT Prophylaxis  :  Lovenox  Lab Results  Component Value Date   PLT 344 03/09/2016    Inpatient Medications  Scheduled Meds: . ARIPiprazole  5 mg Oral Daily  . aspirin EC  81 mg Oral Daily  . divalproex  250 mg Oral Q12H  . enoxaparin (LOVENOX) injection  40 mg Subcutaneous Q24H  . feeding supplement (PRO-STAT SUGAR FREE 64)  30 mL Oral BID  . furosemide  40 mg Intravenous BID  . insulin aspart  0-20 Units Subcutaneous TID WC  . insulin glargine  45 Units Subcutaneous QHS  . levofloxacin (LEVAQUIN) IV  750 mg Intravenous Q24H  . levothyroxine  125 mcg Oral QAC breakfast  . loratadine  10 mg Oral Daily  . losartan  50 mg Oral Daily  . methylPREDNISolone (SOLU-MEDROL) injection  60 mg Intravenous Q6H  . metoprolol tartrate  12.5 mg Oral BID  . pantoprazole  40 mg  Oral Daily  . potassium chloride SA  20 mEq Oral Daily  . simvastatin  10 mg Oral Daily   Continuous Infusions:  PRN Meds:.acetaminophen **OR** acetaminophen, ALPRAZolam, bisacodyl, HYDROcodone-acetaminophen, ipratropium-albuterol, ondansetron **OR** ondansetron (ZOFRAN) IV, polyethylene glycol  Antibiotics  :    Anti-infectives    Start     Dose/Rate Route Frequency Ordered Stop   03/10/16 0015  levofloxacin (LEVAQUIN) IVPB 750 mg     750 mg 100 mL/hr over 90 Minutes Intravenous Every 24 hours 03/10/16 0017           Objective:   Filed Vitals:   03/10/16 1325 03/10/16 2104 03/11/16 0509 03/11/16 0857  BP: 126/62 101/63 126/61 125/50  Pulse: 72 73 66 64  Temp: 98.4 F (36.9 C) 98.3 F (36.8 C) 98.2 F (36.8 C)   TempSrc: Oral Oral Oral   Resp: 18 20 22    Height:      Weight:   82.1 kg (181 lb)   SpO2: 98% 99% 100%     Wt Readings from Last 3 Encounters:  03/11/16 82.1 kg (181 lb)  03/08/16 83.054 kg (183 lb 1.6 oz)  01/26/16 82.3 kg (181 lb 7 oz)     Intake/Output Summary (Last 24 hours) at 03/11/16 1301 Last data filed at 03/11/16 0927  Gross per 24 hour  Intake    240 ml  Output   1052 ml  Net   -812 ml     Physical Exam  Awake Alert, Oriented X 3, No new F.N deficits, Normal affect [place].AT,PERRAL Supple Neck,No JVD, No cervical lymphadenopathy appriciated.  Symmetrical Chest wall movement, Good air movement bilaterally, CTAB RRR,No Gallops,Rubs or new Murmurs, No Parasternal Heave +ve B.Sounds, Abd Soft, No tenderness, No organomegaly appriciated, No rebound - guarding or rigidity. No Cyanosis, Clubbing or edema, No new Rash or bruise  Data Review:    CBC  Recent Labs Lab 03/05/16 0238 03/06/16 0330 03/07/16 0619 03/09/16 1834  WBC 8.8 8.4 10.4 13.8*  HGB 8.0* 8.3* 8.5* 9.2*  HCT 28.4* 29.7* 30.0* 30.7*  PLT 435* 379 390 344  MCV 75.9* 75.0* 76.1* 74.9*  MCH 21.4* 21.0* 21.6* 22.4*  MCHC 28.2* 27.9* 28.3* 30.0  RDW 17.6* 17.2* 17.2*  17.8*  LYMPHSABS  --   --   --  1.7  MONOABS  --   --   --  0.7  EOSABS  --   --   --  0.0  BASOSABS  --   --   --  0.0    Chemistries   Recent Labs Lab 03/06/16 0330 03/07/16 0619 03/09/16 1834 03/10/16 0219 03/11/16 0511  NA 135 138 141 130* 134*  K 4.9 4.2 3.5 4.7 3.6  CL 92* 90* 91* 85* 89*  CO2 36* 39* 38* 33* 37*  GLUCOSE 415* 256* 281* 499* 398*  BUN 25* 23* 25* 25* 24*  CREATININE 0.97 0.86 0.92 0.94 0.81  CALCIUM 8.8* 9.4 8.9 8.2* 8.9   ------------------------------------------------------------------------------------------------------------------ No results for input(s): CHOL, HDL, LDLCALC, TRIG, CHOLHDL, LDLDIRECT in the last 72 hours.  Lab Results  Component Value Date   HGBA1C 7.5* 01/11/2016   ------------------------------------------------------------------------------------------------------------------ No results for input(s): TSH, T4TOTAL, T3FREE, THYROIDAB in the last 72 hours.  Invalid input(s): FREET3 ------------------------------------------------------------------------------------------------------------------  Recent Labs  03/10/16 0219  VITAMINB12 442  FERRITIN 24  TIBC 385  IRON 16*    Coagulation profile No results for input(s): INR, PROTIME in the last 168 hours.   Recent Labs  03/09/16 1834  DDIMER 0.80*    Cardiac Enzymes No results for input(s): CKMB, TROPONINI, MYOGLOBIN in the last 168 hours.  Invalid input(s): CK ------------------------------------------------------------------------------------------------------------------    Component Value Date/Time   BNP 181.7* 03/09/2016 1834    Micro Results Recent Results (from the past 240 hour(s))  Culture, respiratory (NON-Expectorated)     Status: None (Preliminary result)   Collection Time: 03/10/16  7:30 AM  Result Value Ref Range Status   Specimen Description SPUTUM  Final   Special Requests NONE  Final   Gram Stain   Final    NO WBC SEEN NO ORGANISMS  SEEN GRAM STAIN REVIEWED-AGREE WITH RESULT V WILKINS    Culture   Final    CULTURE REINCUBATED FOR BETTER GROWTH Performed at [facility]    Report Status PENDING  Incomplete  Culture, sputum-assessment     Status: None   Collection Time: 03/10/16  7:32 AM  Result Value Ref Range Status   Specimen Description SPUTUM  Final   Special Requests NONE  Final   Sputum evaluation   Final    THIS SPECIMEN IS ACCEPTABLE. RESPIRATORY CULTURE REPORT TO FOLLOW.   Report Status 03/10/2016 FINAL  Final    Radiology Reports Dg Neck Soft Tissue  03/02/2016  CLINICAL DATA:  Stridor.  Shortness of breath. EXAM: NECK SOFT TISSUES - 1+ VIEW COMPARISON:  None. FINDINGS: There is no evidence of retropharyngeal soft tissue swelling or epiglottic enlargement. The cervical airway is unremarkable and no radio-opaque foreign body identified. Status post C4 through C7 ACDF. IMPRESSION: Negative. Electronically Signed   By: Awilda Metro M.D.   On: 03/02/2016 03:43   Dg Chest 2 View  03/09/2016  CLINICAL DATA:  C/o SOB. Pt has history of COPD and anxiety. H/o HTN, MI in 2007, emphysema, COPD, heart murmur, type II diabetes, CHF. Former smoker. EXAM:  CHEST  2 VIEW COMPARISON:  03/02/2016 FINDINGS: Stable mild cardiac enlargement. Mild vascular congestion. Mildly increased markings in the perihilar regions with focal mild left mid lung infiltrate. No pleural effusion. IMPRESSION: Significant improvement with largely resolved pulmonary edema. Left mid lung infiltrate could represent residual edema, atelectasis, or pneumonia. Based on the appearance on the lateral view atelectasis is favored. Radiographic follow-up recommended. Electronically Signed   By: Esperanza Heiraymond  Rubner M.D.   On: 03/09/2016 17:11   Ct Chest Wo Contrast  03/02/2016  CLINICAL DATA:  Dyspnea for 4 days. Patient is on home oxygen. Inpatient. EXAM: CT CHEST WITHOUT CONTRAST TECHNIQUE: Multidetector CT imaging of the chest was performed following  the standard protocol without IV contrast. COMPARISON:  Chest radiograph from earlier today. 01/14/2016 chest CT. FINDINGS: Mediastinum/Nodes: Stable mild cardiomegaly. Stable trace pericardial fluid/ thickening. Left anterior descending and right coronary atherosclerosis. Great vessels are normal in course and caliber. Normal visualized thyroid. Normal esophagus. No axillary adenopathy. Mildly enlarged 1.0 cm right paratracheal node (series 2/ image 51), stable since 01/14/2016. No additional pathologically enlarged mediastinal or gross hilar nodes on this noncontrast study. 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 There are mild scattered secretions within the thoracic tracheal lumen. No pneumothorax. Trace bilateral pleural effusions. Mild paraseptal emphysema predominantly at the right lung apex. There is relatively diffuse ground-glass opacity throughout both lungs with areas of mosaic attenuation throughout both lungs. There are parenchymal bands and patchy dependent areas of consolidation in both lower lobes with associated mild volume loss. Superior segment left lower lobe 4 mm solid pulmonary nodule (series 3/ image 61), which appears new. Upper abdomen: Unremarkable. Musculoskeletal: No aggressive appearing focal osseous lesions. Partially visualized surgical plate and interlocking and non interlocking screws in the right proximal humerus. Partially visualized left shoulder hemiarthroplasty. Partially visualized surgical hardware from ACDF in the lower cervical spine. Chronic moderate compression fracture of the T12 vertebral body. Moderate degenerative changes in the thoracic spine. IMPRESSION: 1. High-grade stenosis of the cervical trachea, probably due to scarring from prior endotracheal intubation. Mild scattered secretions within the  thoracic tracheal lumen. 2. Patchy dependent areas of consolidation in both lower lobes with associated mild volume loss. Although likely partially due to atelectasis, a component of aspiration pneumonia is suspected. 3. Stable mild cardiomegaly. Diffuse ground-glass opacity throughout both lungs, suspect mild pulmonary edema. 4. Mosaic attenuation in the lungs, most commonly due to air trapping from small airways disease. 5. Two vessel coronary atherosclerosis. 6. Stable mild right paratracheal mediastinal lymphadenopathy, probably reactive. 7. Trace bilateral pleural effusions. Electronically Signed   By: Delbert PhenixJason A Poff M.D.   On: 03/02/2016 19:56   Ct Angio Chest Pe W/cm &/or Wo Cm  03/09/2016  CLINICAL DATA:  Shortness of breath, elevated D-dimer. EXAM: CT ANGIOGRAPHY CHEST WITH CONTRAST TECHNIQUE: Multidetector CT imaging of the chest was performed using the standard protocol during bolus administration of intravenous contrast. Multiplanar CT image reconstructions and MIPs were obtained to evaluate the vascular anatomy. CONTRAST:  100 cc Isovue 370 IV COMPARISON:  Chest radiographs earlier this day. Chest CT 1 week prior 03/02/2016 FINDINGS: There are no filling defects within the pulmonary arteries to suggest pulmonary embolus. Motion artifact limits the mid and lower lung zone evaluation, however no findings suspicious for central pulmonary embolus. Thoracic aorta is normal in caliber. No aortic dissection. Stable  cardiomegaly. Coronary artery calcifications again seen. Minimal pericardial thickening is unchanged from prior exam. Previous enlarged right lower paratracheal node has decreased in size currently 7 mm. No new mediastinal or hilar adenopathy. 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 Tracheobronchial secretions have minimally increased from recent prior. Mild emphysema. Linear and ground-glass opacities throughout both lungs with macro mosaic attenuation, not significantly changed  from prior. The previous 4 mm nodule in the superior segment of the left lower lobe currently measures 3 mm and has decreased in size image 36 series 11. More inferiorly in the left lower lobe there is a 4 mm nodule that was previously obscured image 47 series 11. Previous pleural effusions have resolved. No acute abnormality in the included upper abdomen. The esophagus is patulous and dilated proximally. Postsurgical change in the right and left proximal humerus. Compression deformity of T12 vertebral body with associated vacuum phenomenon is unchanged. Bones are under mineralized. There are no acute or suspicious osseous abnormalities. Review of the MIP images confirms the above findings. IMPRESSION: 1. No pulmonary embolus. 2. 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 Minimal increase in tracheobronchial secretions from prior. 3. Unchanged appearance of the lung parenchyma with multifocal patchy, linear, and ground-glass opacities. This likely represents combination of atelectasis, sequela of aspiration and possibly minimal pulmonary edema. 4. Left lower lobe pulmonary nodules, largest measuring 4 mm. No follow-up needed if patient is low-risk (and has no known or suspected primary neoplasm). Non-contrast chest CT can be considered in 12 months if patient is high-risk. This recommendation follows the consensus statement: Guidelines for Management of Incidental Pulmonary Nodules Detected on CT Images:From the Fleischner Society 2017; published online before print (10.1148/radiol.1610960454). 5. Additional stable chronic findings as described. Electronically Signed   By: Rubye Oaks M.D.   On: 03/09/2016 20:23   Dg Chest Port 1 View  03/02/2016  CLINICAL DATA:  Acute onset of shortness of breath and wheezing. Initial encounter. EXAM: PORTABLE CHEST 1 VIEW COMPARISON:  Chest radiograph performed 01/18/2016 FINDINGS: The lungs are well-aerated. Vascular congestion is noted. Increased  interstitial markings raise concern for pulmonary edema. There is no evidence of pleural effusion or pneumothorax. The cardiomediastinal silhouette is mildly enlarged. No acute osseous abnormalities are seen. Cervical spinal fusion hardware is partially imaged. A left shoulder arthroplasty is partially characterized. Hardware is noted along the proximal right humerus. IMPRESSION: Vascular congestion and mild cardiomegaly. Increased interstitial markings raise concern for pulmonary edema. Electronically Signed   By: Roanna Raider M.D.   On: 03/02/2016 02:41    Time Spent in minutes  30   Debbra Riding PA-S on 03/11/2016 at 1:01 PM      Sherran Margolis A Merril Nagy Pager: [phone] 03/11/2016, 4:38 PM

## 2016-03-11 NOTE — Progress Notes (Signed)
Inpatient Diabetes Program Recommendations  AACE/ADA: New Consensus Statement on Inpatient Glycemic Control (2015)  Target Ranges:  Prepandial:   less than 140 mg/dL      Peak postprandial:   less than 180 mg/dL (1-2 hours)      Critically ill patients:  140 - 180 mg/dL   Results for Evaristo BuryMCBRIDE, Elisa J (MRN [id number]) as of 03/11/2016 14:27  Ref. Range 03/10/2016 15:53 03/10/2016 17:07 03/10/2016 21:06 03/11/2016 07:56 03/11/2016 12:08  Glucose-Capillary Latest Ref Range: 65-99 mg/dL 119260 (H) 147196 (H) 829343 (H) 426 (H) 217 (H)   Results for Evaristo BuryMCBRIDE, Mahealani J (MRN [id number]) as of 03/11/2016 14:27  Ref. Range 01/11/2016 18:43  Hemoglobin A1C Latest Ref Range: 4.8-5.6 % 7.5 (H)   Inpatient Diabetes Program Recommendations:    Please consider addition of Novolog 5 units tidwc for meal coverage insulin. Add HS correction.  Will follow. Thank you. Ailene Ardshonda Jenisis Harmsen, RD, LDN, CDE Inpatient Diabetes Coordinator [phone]

## 2016-03-11 NOTE — Progress Notes (Signed)
Pt CBG 426 after eating breakfast. MD notified. Order to administer 20 units Novolog. Will continue to monitor.

## 2016-03-11 NOTE — Care Management Note (Signed)
Case Management Note  Patient Details  Name: Elizabeth BuryMary J [name] MRN: [id number] Date of Birth: [birth year]  Subjective/Objective: 55 y/o f admitted w/COPD. From home. Patient states she is active w/[facility], CSW w/[facility] rep Clydie BraunKaren. She also states she has home 02-[facility]. States her home 02 machine may not be working properly-[facility] to please check dme in home while patient is here in the hospital.Patient states she can't afford her $3 co pay-patient has medicaid-informed it is her obligation to pay her co pay-there is no govt coverage for her medicaid co pay-here in the hospital there is no program to pay her co pay.Patient is @ home w/family support, & has private sitters.Will look @ [facility], or [facility].                  Action/Plan:   Expected Discharge Date:                  Expected Discharge Plan:  Home w Home Health Services  In-House Referral:     Discharge planning Services  CM Consult  Post Acute Care Choice:  Home Health, Durable Medical Equipment [facility] home [phone]) Choice offered to:  Patient  DME Arranged:    DME Agency:     HH Arranged:    HH Agency:     Status of Service:  In process, will continue to follow  Medicare Important Message Given:    Date Medicare IM Given:    Medicare IM give by:    Date Additional Medicare IM Given:    Additional Medicare Important Message give by:     If discussed at Long Length of Stay Meetings, dates discussed:    Additional Comments:  Lanier ClamMahabir, Baelyn Doring, RN 03/11/2016, 2:46 PM

## 2016-03-12 LAB — BASIC METABOLIC PANEL
ANION GAP: 8 (ref 5–15)
BUN: 36 mg/dL — ABNORMAL HIGH (ref 6–20)
CALCIUM: 8.9 mg/dL (ref 8.9–10.3)
CO2: 36 mmol/L — ABNORMAL HIGH (ref 22–32)
CREATININE: 0.84 mg/dL (ref 0.44–1.00)
Chloride: 91 mmol/L — ABNORMAL LOW (ref 101–111)
Glucose, Bld: 387 mg/dL — ABNORMAL HIGH (ref 65–99)
Potassium: 4.5 mmol/L (ref 3.5–5.1)
SODIUM: 135 mmol/L (ref 135–145)

## 2016-03-12 LAB — GLUCOSE, CAPILLARY
GLUCOSE-CAPILLARY: 409 mg/dL — AB (ref 65–99)
GLUCOSE-CAPILLARY: 319 mg/dL — AB (ref 65–99)
Glucose-Capillary: 238 mg/dL — ABNORMAL HIGH (ref 65–99)
Glucose-Capillary: 173 mg/dL — ABNORMAL HIGH (ref 65–99)
Glucose-Capillary: 180 mg/dL — ABNORMAL HIGH (ref 65–99)

## 2016-03-12 LAB — CULTURE, RESPIRATORY
CULTURE: NORMAL
GRAM STAIN: NONE SEEN

## 2016-03-12 LAB — FOLATE RBC
Folate, Hemolysate: 438.2 ng/mL
Folate, RBC: 1506 ng/mL (ref >498)
Hematocrit: 29.1 % — ABNORMAL LOW (ref 34.0–46.6)

## 2016-03-12 MED ORDER — INSULIN ASPART 100 UNIT/ML ~~LOC~~ SOLN
5.0000 [IU] | Freq: Once | SUBCUTANEOUS | Status: AC
  Administered 2016-03-12: 5 [IU] via SUBCUTANEOUS

## 2016-03-12 MED ORDER — METHYLPREDNISOLONE SODIUM SUCC 125 MG IJ SOLR
60.0000 mg | Freq: Two times a day (BID) | INTRAMUSCULAR | Status: DC
  Administered 2016-03-12 – 2016-03-13 (×2): 60 mg via INTRAVENOUS
  Filled 2016-03-12 (×2): qty 2

## 2016-03-12 NOTE — Progress Notes (Signed)
Occupational Therapy Treatment Patient Details Name: VETRA SHINALL MRN: [id number] DOB: 13-Apr-1961 Today's Date: 03/12/2016    History of present illness 55 year old was  discharged from the hospital on 03/08/2016 and came in back less than 24 hours HX: COPD, on 3 L of home oxygen, chronic diastolic CHF and generalized anxiety disorder, bipolar, HTN, substance, back surgery x3, chronic pain;  Please note that she has 7 admissions to the hospital with respiratory difficulty/distress in the past 6 months.   OT comments  Patient progressing slowly towards OT goals; very anxious during session. States she will be going home at discharge and has all necessary equipment at home as well as an aide 7 days/ week to assist with BADL/IADL. OT will continue to follow.   Follow Up Recommendations  Supervision/Assistance - 24 hour;Home health OT ; Other (resume aide services as prior to admission)   Equipment Recommendations  None recommended by OT    Recommendations for Other Services      Precautions / Restrictions Precautions Precautions: Fall Precaution Comments: monitor O2       Mobility Bed Mobility Overal bed mobility: Needs Assistance Bed Mobility: Supine to Sit;Sit to Supine     Supine to sit: HOB elevated;Min assist Sit to supine: Min assist   General bed mobility comments: HOB elevated and pt uses bed rail for supine>sit.  No physical assist needed.  Transfers Overall transfer level: Needs assistance Equipment used: 1 person hand held assist Transfers: Sit to/from Stand Sit to Stand: Min assist Stand pivot transfers: Min assist       General transfer comment: to/from [facility]    Balance                                   ADL Overall ADL's : Needs assistance/impaired Eating/Feeding: Independent;Bed level   Grooming: Set up;Bed level   Upper Body Bathing: Moderate assistance;Sitting   Lower Body Bathing: Maximal assistance;Sit to/from stand            Toilet Transfer: Minimal assistance;BSC;Stand-pivot;Cueing for sequencing   Toileting- Clothing Manipulation and Hygiene: Minimal assistance;Sit to/from stand       Functional mobility during ADLs: Minimal assistance General ADL Comments: Patient was finishing up bathing with nurse tech. Patient reports nurse tech did most of the bathing. Patient also used BSC. Highly anxious. Reports she has all necessary DME for home and that she will be going home. Will update d/c recommendations.      Vision                     Perception     Praxis      Cognition   Behavior During Therapy: Anxious Overall Cognitive Status: Within Functional Limits for tasks assessed                       Extremity/Trunk Assessment               Exercises General Exercises - Upper Extremity Shoulder Flexion: Other (comment) (pt unable to tolerate due to B shoulder pain) Elbow Flexion: AROM;Both;Seated;10 reps   Shoulder Instructions       General Comments      Pertinent Vitals/ Pain       Pain Assessment: No/denies pain  Home Living  Prior Functioning/Environment              Frequency Min 2X/week     Progress Toward Goals  OT Goals(current goals can now be found in the care plan section)  Progress towards OT goals: Progressing toward goals     Plan Discharge plan needs to be updated    Co-evaluation                 End of Session Equipment Utilized During Treatment: Oxygen   Activity Tolerance Patient limited by fatigue   Patient Left in bed;with call bell/phone within reach;with bed alarm set;with nursing/sitter in room   Nurse Communication Mobility status        Time: [id number] OT Time Calculation (min): 17 min  Charges: OT General Charges $OT Visit: 1 Procedure OT Treatments $Self Care/Home Management : 8-22 mins  Jahniya Duzan A 03/12/2016, 10:19 AM

## 2016-03-12 NOTE — Care Management Note (Signed)
Case Management Note  Patient Details  Name: Evaristo BuryMary J Bracher MRN: [id number] Date of Birth: 1961-03-19  Subjective/Objective:Spoke to CM with [phone]-they have same day delivery if receive scripts by 12p,they can bill patient's acct.  If transport needed @ d/c can arrange with [facility].                    Action/Plan:d/c plan home w/HHC.   Expected Discharge Date:                  Expected Discharge Plan:  Home w Home Health Services  In-House Referral:     Discharge planning Services  CM Consult  Post Acute Care Choice:  Home Health, Durable Medical Equipment [facility] home 02, home neb machine) Choice offered to:  Patient  DME Arranged:    DME Agency:     HH Arranged:    HH Agency:     Status of Service:  In process, will continue to follow  If discussed at Long Length of Stay Meetings, dates discussed:    Additional Comments:  Lanier ClamMahabir, Quinnton Bury, RN 03/12/2016, 2:37 PM

## 2016-03-12 NOTE — Care Management Note (Signed)
Case Management Note  Patient Details  Name: Evaristo BuryMary J Seelinger MRN: [id number] Date of Birth: 04/25/1961  Subjective/Objective: [facility] dme rep will get a new home 02 machine & deliver to patient's home.Patient also has home neb machine. [facility]. Child psychotherapistocial worker.Contact [facility]-Kim Lanier-informed me that patient is well known-[facility] fro [facility] home but states she can't afford her meds. [facility] they deliver-however they have to follow medicaid guidelines also-patient is responsible for her co-pay-there is no service that will pay her co-pay-she is obligated by the gov't. Explained that again to the patient, also explained to patient that she can go to the local pharmacy to get her meds filled @ d/c & will be responsible for her $3 co pay. [facility] will see patient in rm tomorrow.                    Action/Plan:   Expected Discharge Date:                  Expected Discharge Plan:  Home w Home Health Services  In-House Referral:     Discharge planning Services  CM Consult  Post Acute Care Choice:  Home Health, Durable Medical Equipment [facility] home 02, home neb machine) Choice offered to:  Patient  DME Arranged:    DME Agency:     HH Arranged:    HH Agency:     Status of Service:  In process, will continue to follow  If discussed at Long Length of Stay Meetings, dates discussed:    Additional Comments:  Lanier ClamMahabir, Tarrie Mcmichen, RN 03/12/2016, 12:08 PM

## 2016-03-12 NOTE — Progress Notes (Signed)
Inpatient Diabetes Program Recommendations  AACE/ADA: New Consensus Statement on Inpatient Glycemic Control (2015)  Target Ranges:  Prepandial:   less than 140 mg/dL      Peak postprandial:   less than 180 mg/dL (1-2 hours)      Critically ill patients:  140 - 180 mg/dL   Lab Results  Component Value Date   GLUCAP 319* 03/12/2016   HGBA1C 7.5* 01/11/2016  Results for [name], Eltha J (MRN [id number]) as of 03/12/2016 09:24  Ref. Range 03/11/2016 12:08 03/11/2016 16:37 03/11/2016 22:20 03/12/2016 04:19 03/12/2016 07:17  Glucose-Capillary Latest Ref Range: 65-99 mg/dL 409217 (H) 811247 (H) 914225 (H) 409 (H) 319 (H)   Steroid-induced hyperglycemia. Reduced Solumedrol 60 Q12H. Needs meal coverage insulin.  Inpatient Diabetes Program Recommendations:   Add Novolog 5 units tidwc for meal coverage insulin. Add HS correction.  Will continue to follow. Thank you. Ailene Ardshonda Zyen Triggs, RD, LDN, CDE Inpatient Diabetes Coordinator [phone]

## 2016-03-12 NOTE — Care Management Note (Signed)
Case Management Note  Patient Details  Name: Elizabeth BuryMary J [name] MRN: [id number] Date of Birth: March 29, 1961  Subjective/Objective:  [facility]                  Action/Plan:d/c plan home w/HHC.   Expected Discharge Date:                  Expected Discharge Plan:  Home w Home Health Services  In-House Referral:     Discharge planning Services  CM Consult  Post Acute Care Choice:  Home Health, Durable Medical Equipment [facility] home 02, home neb machine) Choice offered to:  Patient  DME Arranged:    DME Agency:     HH Arranged:    HH Agency:     Status of Service:  In process, will continue to follow  If discussed at Long Length of Stay Meetings, dates discussed:    Additional Comments:  [name], Elizabeth Blanda, RN 03/12/2016, 3:51 PM

## 2016-03-12 NOTE — Progress Notes (Signed)
PROGRESS NOTE    [name]  ZOX:[id number] DOB: 1960-10-24 DOA: 03/09/2016 PCP: [name], MD   Brief Narrative:  HPI On 03/10/2016 by Dr. Odie Sera SOLSTICE [name] is a 55 y.o. female with medical history significant for COPD on home oxygen, chronic diastolic CHF, generalized anxiety disorder, bipolar 1 disorder, hypertension, insulin-dependent diabetes mellitus, and hypothyroidism who presents to the ED for evaluation of dyspnea and cough. Patient is chronically ill and has had increasingly frequent hospitalizations for respiratory problems. In fact, she was just discharged the day prior to her presentation following management for dyspnea which was attributed to acute on chronic diastolic CHF and COPD with acute exacerbation. Patient was diuresed during this admission, treated with DuoNeb and steroids, made a good recovery, and was discharged home on 03/08/2016 and much improved and stable condition. Unfortunately, back at home, patient's dyspnea worsened significantly overnight and was not relieved by using her albuterol neb 3. She called EMS and DuoNeb was administered en route to the hospital 2. Patient denies orthopnea or lower extremity edema at this time. She does endorse a new cough, occasionally productive of thick white sputum. She denies chest pain or palpitations, but notes severe dyspnea, worse with minimal exertion. She has been using supplemental oxygen around the clock and was discharged home with a prednisone taper, but has not yet picked this up from the pharmacy.  Assessment & Plan   COPD with acute exacerbation  -Presented with increased cough and dyspnea.  -Discharged from the hospital 03/08/16 with prednisone taper, but not able to obtain.  -Started on DuoNeb q4h prn, Solu-Medrol 60 mg IV q6h, and Levaquin 750 mg IV q24h.  -will decrease solumedrol today  -SW and CM consulted, advanced home health on discharge to evaluate home equipment. -Have her follow up  with ENT and pulmonologist when discharged.   Chronic diastolic CHF  -No evidence for acute CHF.  -TTE (12/26/15) with EF 60-65%, severe mitral stenosis, and grade 2 diastolic dysfunction. -Continue current management of Lasix , Lopressor, losartan.  -Continue daily weights and I/O's, fluid-restrict diet.  Diabetes mellitus, type II -A1c 7.5% in April 2017, showing sub-optimal glycemic-control.  -Held glimepiride while in hospital -Continue Lantus,ISS, CBG monitoring  Hypertension  -continue Lopressor and losartan.  Hypothyroidism  -continue Synthroid 125 mcg daily   Bipolar disorder, anxiety  -Appears to be stable, continue current management with Depakote, Abilify, and prn Xanax. -Her anxiety appears to be contributing to her recurrent respiratory distress symptoms.  GERD  -Stable; continue PP  Microcytic anemia  -Hgb 9.2, MCV is 74.9 on admission.  -No sign of active blood-loss.  -Iron is 16, TIBC wnl, and Ferritin wnl suggesting anemia from chronic disease.  -Continue to monitor CBC  LLL Pulmonary nodules -CT on 03/09/2016 shows pulmonary nodules in the LLL, the largest measuring 4mm. -Follow up as outpatient with PCP.  DVT Prophylaxis Lovenox  Code Status: Full  Family Communication: None at bedside  Disposition Plan: Admitted, possible discharge to home 03/13/2016  Consultants None  Procedures  None  Antibiotics   Anti-infectives    Start     Dose/Rate Route Frequency Ordered Stop   03/10/16 0015  levofloxacin (LEVAQUIN) IVPB 750 mg     750 mg 100 mL/hr over 90 Minutes Intravenous Every 24 hours 03/10/16 0017        Subjective:   [name] seen and examined today.  Patient continues to complain of shortness of breath. Denies chest pain. Does endorse anxiety. Denies abdominal  pain, nausea, vomiting, dizziness, headache.   Objective:   Filed Vitals:   03/11/16 1520 03/11/16 2202 03/12/16 0443 03/12/16 1027  BP: 122/51 111/56 109/50 110/60    Pulse: 70 74 63 65  Temp: 98.3 F (36.8 C) 98 F (36.7 C) 97.8 F (36.6 C)   TempSrc: Oral Oral Oral   Resp: 20 20 20    Height:      Weight:   81.9 kg (180 lb 8.9 oz)   SpO2: 100% 99% 100%     Intake/Output Summary (Last 24 hours) at 03/12/16 1316 Last data filed at 03/12/16 1000  Gross per 24 hour  Intake   1200 ml  Output   1650 ml  Net   -450 ml   Filed Weights   03/10/16 0225 03/11/16 0509 03/12/16 0443  Weight: 83.8 kg (184 lb 11.9 oz) 82.1 kg (181 lb) 81.9 kg (180 lb 8.9 oz)    Exam  General: Well developed, well nourished, NAD  HEENT: NCAT,mucous membranes moist.   Cardiovascular: S1 S2 auscultated, no murmurs, RRR  Respiratory: Diminished but clear  Abdomen: Soft, nontender, nondistended, + bowel sounds  Extremities: warm dry without cyanosis clubbing or edema  Neuro: AAOx3, nonfocal   Psych: Normal affect and demeanor    Data Reviewed: I have personally reviewed following labs and imaging studies  CBC:  Recent Labs Lab 03/06/16 0330 03/07/16 0619 03/09/16 1834 03/10/16 0219  WBC 8.4 10.4 13.8*  --   NEUTROABS  --   --  11.4*  --   HGB 8.3* 8.5* 9.2*  --   HCT 29.7* 30.0* 30.7* 29.1*  MCV 75.0* 76.1* 74.9*  --   PLT 379 390 344  --    Basic Metabolic Panel:  Recent Labs Lab 03/07/16 0619 03/09/16 1834 03/10/16 0219 03/11/16 0511 03/12/16 0537  NA 138 141 130* 134* 135  K 4.2 3.5 4.7 3.6 4.5  CL 90* 91* 85* 89* 91*  CO2 39* 38* 33* 37* 36*  GLUCOSE 256* 281* 499* 398* 387*  BUN 23* 25* 25* 24* 36*  CREATININE 0.86 0.92 0.94 0.81 0.84  CALCIUM 9.4 8.9 8.2* 8.9 8.9   GFR: Estimated Creatinine Clearance: 66 mL/min (by C-G formula based on Cr of 0.84). Liver Function Tests: No results for input(s): AST, ALT, ALKPHOS, BILITOT, PROT, ALBUMIN in the last 168 hours. No results for input(s): LIPASE, AMYLASE in the last 168 hours. No results for input(s): AMMONIA in the last 168 hours. Coagulation Profile: No results for input(s):  INR, PROTIME in the last 168 hours. Cardiac Enzymes: No results for input(s): CKTOTAL, CKMB, CKMBINDEX, TROPONINI in the last 168 hours. BNP (last 3 results) No results for input(s): PROBNP in the last 8760 hours. HbA1C: No results for input(s): HGBA1C in the last 72 hours. CBG:  Recent Labs Lab 03/11/16 1637 03/11/16 2220 03/12/16 0419 03/12/16 0717 03/12/16 1131  GLUCAP 247* 225* 409* 319* 238*   Lipid Profile: No results for input(s): CHOL, HDL, LDLCALC, TRIG, CHOLHDL, LDLDIRECT in the last 72 hours. Thyroid Function Tests: No results for input(s): TSH, T4TOTAL, FREET4, T3FREE, THYROIDAB in the last 72 hours. Anemia Panel:  Recent Labs  03/10/16 0219  VITAMINB12 442  FERRITIN 24  TIBC 385  IRON 16*   Urine analysis:    Component Value Date/Time   COLORURINE YELLOW 03/02/2016 0914   APPEARANCEUR CLEAR 03/02/2016 0914   LABSPEC 1.008 03/02/2016 0914   PHURINE 6.5 03/02/2016 0914   GLUCOSEU NEGATIVE 03/02/2016 0914   HGBUR NEGATIVE 03/02/2016 0914  BILIRUBINUR NEGATIVE 03/02/2016 0914   KETONESUR NEGATIVE 03/02/2016 0914   PROTEINUR NEGATIVE 03/02/2016 0914   UROBILINOGEN 0.2 07/29/2015 1948   NITRITE NEGATIVE 03/02/2016 0914   LEUKOCYTESUR NEGATIVE 03/02/2016 0914   Sepsis Labs: @LABRCNTIP (procalcitonin:4,lacticidven:4)  ) Recent Results (from the past 240 hour(s))  Culture, respiratory (NON-Expectorated)     Status: None (Preliminary result)   Collection Time: 03/10/16  7:30 AM  Result Value Ref Range Status   Specimen Description SPUTUM  Final   Special Requests NONE  Final   Gram Stain   Final    NO WBC SEEN NO ORGANISMS SEEN GRAM STAIN REVIEWED-AGREE WITH RESULT V WILKINS    Culture   Final    CULTURE REINCUBATED FOR BETTER GROWTH Performed at [facility]    Report Status PENDING  Incomplete  Culture, sputum-assessment     Status: None   Collection Time: 03/10/16  7:32 AM  Result Value Ref Range Status   Specimen Description SPUTUM   Final   Special Requests NONE  Final   Sputum evaluation   Final    THIS SPECIMEN IS ACCEPTABLE. RESPIRATORY CULTURE REPORT TO FOLLOW.   Report Status 03/10/2016 FINAL  Final      Radiology Studies: No results found.   Scheduled Meds: . ARIPiprazole  5 mg Oral Daily  . aspirin EC  81 mg Oral Daily  . divalproex  250 mg Oral Q12H  . enoxaparin (LOVENOX) injection  40 mg Subcutaneous Q24H  . feeding supplement (PRO-STAT SUGAR FREE 64)  30 mL Oral BID  . furosemide  40 mg Intravenous BID  . insulin aspart  0-20 Units Subcutaneous TID WC  . insulin glargine  45 Units Subcutaneous QHS  . levofloxacin (LEVAQUIN) IV  750 mg Intravenous Q24H  . levothyroxine  125 mcg Oral QAC breakfast  . loratadine  10 mg Oral Daily  . losartan  50 mg Oral Daily  . methylPREDNISolone (SOLU-MEDROL) injection  60 mg Intravenous Q12H  . metoprolol tartrate  12.5 mg Oral BID  . pantoprazole  40 mg Oral Daily  . simvastatin  10 mg Oral Daily   Continuous Infusions:    LOS: 2 days   Time Spent in minutes   30 minutes  Baljit Liebert D.O. on 03/12/2016 at 1:16 PM  Between 7am to 7pm - Pager - [phone]  After 7pm go to [email or web address] - password [email or web address]  And look for the night coverage person covering for me after hours  [facility] Office  [phone]

## 2016-03-13 LAB — GLUCOSE, CAPILLARY: Glucose-Capillary: 291 mg/dL — ABNORMAL HIGH (ref 65–99)

## 2016-03-13 MED ORDER — PRO-STAT SUGAR FREE PO LIQD: 30.0000 mL | Freq: Two times a day (BID) | ORAL | Status: AC

## 2016-03-13 MED ORDER — LEVOFLOXACIN 750 MG PO TABS: 750.0000 mg | ORAL_TABLET | Freq: Every day | ORAL | Status: AC

## 2016-03-13 MED ORDER — PREDNISONE 10 MG PO TABS: ORAL_TABLET | ORAL | Status: AC

## 2016-03-13 NOTE — Discharge Instructions (Signed)
Chronic Obstructive Pulmonary Disease Chronic obstructive pulmonary disease (COPD) is a common lung condition in which airflow from the lungs is limited. COPD is a general term that can be used to describe many different lung problems that limit airflow, including both chronic bronchitis and emphysema. If you have COPD, your lung function will probably never return to normal, but there are measures you can take to improve lung function and make yourself feel better. CAUSES   Smoking (common).  Exposure to secondhand smoke.  Genetic problems.  Chronic inflammatory lung diseases or recurrent infections. SYMPTOMS  Shortness of breath, especially with physical activity.  Deep, persistent (chronic) cough with a large amount of thick mucus.  Wheezing.  Rapid breaths (tachypnea).  Gray or bluish discoloration (cyanosis) of the skin, especially in your fingers, toes, or lips.  Fatigue.  Weight loss.  Frequent infections or episodes when breathing symptoms become much worse (exacerbations).  Chest tightness. DIAGNOSIS Your health care provider will take a medical history and perform a physical examination to diagnose COPD. Additional tests for COPD may include:  Lung (pulmonary) function tests.  Chest X-ray.  CT scan.  Blood tests. TREATMENT  Treatment for COPD may include:  Inhaler and nebulizer medicines. These help manage the symptoms of COPD and make your breathing more comfortable.  Supplemental oxygen. Supplemental oxygen is only helpful if you have a low oxygen level in your blood.  Exercise and physical activity. These are beneficial for nearly all people with COPD.  Lung surgery or transplant.  Nutrition therapy to gain weight, if you are underweight.  Pulmonary rehabilitation. This may involve working with a team of health care providers and specialists, such as respiratory, occupational, and physical therapists. HOME CARE INSTRUCTIONS  Take all medicines  (inhaled or pills) as directed by your health care provider.  Avoid over-the-counter medicines or cough syrups that dry up your airway (such as antihistamines) and slow down the elimination of secretions unless instructed otherwise by your health care provider.  If you are a smoker, the most important thing that you can do is stop smoking. Continuing to smoke will cause further lung damage and breathing trouble. Ask your health care provider for help with quitting smoking. He or she can direct you to community resources or hospitals that provide support.  Avoid exposure to irritants such as smoke, chemicals, and fumes that aggravate your breathing.  Use oxygen therapy and pulmonary rehabilitation if directed by your health care provider. If you require home oxygen therapy, ask your health care provider whether you should purchase a pulse oximeter to measure your oxygen level at home.  Avoid contact with individuals who have a contagious illness.  Avoid extreme temperature and humidity changes.  Eat healthy foods. Eating smaller, more frequent meals and resting before meals may help you maintain your strength.  Stay active, but balance activity with periods of rest. Exercise and physical activity will help you maintain your ability to do things you want to do.  Preventing infection and hospitalization is very important when you have COPD. Make sure to receive all the vaccines your health care provider recommends, especially the pneumococcal and influenza vaccines. Ask your health care provider whether you need a pneumonia vaccine.  Learn and use relaxation techniques to manage stress.  Learn and use controlled breathing techniques as directed by your health care provider. Controlled breathing techniques include:  Pursed lip breathing. Start by breathing in (inhaling) through your nose for 1 second. Then, purse your lips as if you were   going to whistle and breathe out (exhale) through the  pursed lips for 2 seconds.  Diaphragmatic breathing. Start by putting one hand on your abdomen just above your waist. Inhale slowly through your nose. The hand on your abdomen should move out. Then purse your lips and exhale slowly. You should be able to feel the hand on your abdomen moving in as you exhale.  Learn and use controlled coughing to clear mucus from your lungs. Controlled coughing is a series of short, progressive coughs. The steps of controlled coughing are: 1. Lean your head slightly forward. 2. Breathe in deeply using diaphragmatic breathing. 3. Try to hold your breath for 3 seconds. 4. Keep your mouth slightly open while coughing twice. 5. Spit any mucus out into a tissue. 6. Rest and repeat the steps once or twice as needed. SEEK MEDICAL CARE IF:  You are coughing up more mucus than usual.  There is a change in the color or thickness of your mucus.  Your breathing is more labored than usual.  Your breathing is faster than usual. SEEK IMMEDIATE MEDICAL CARE IF:  You have shortness of breath while you are resting.  You have shortness of breath that prevents you from:  Being able to talk.  Performing your usual physical activities.  You have chest pain lasting longer than 5 minutes.  Your skin color is more cyanotic than usual.  You measure low oxygen saturations for longer than 5 minutes with a pulse oximeter. MAKE SURE YOU:  Understand these instructions.  Will watch your condition.  Will get help right away if you are not doing well or get worse.   This information is not intended to replace advice given to you by your health care provider. Make sure you discuss any questions you have with your health care provider.   Document Released: 06/19/2005 Document Revised: 09/30/2014 Document Reviewed: 05/06/2013 [employer] Interactive Patient Education 2016 [employer]  

## 2016-03-13 NOTE — Progress Notes (Signed)
Reviewed discharge information with patient. Answered all questions. Patient able to teach back medications and reasons to contact MD/911. Patient verbalizes importance of PCP follow up appointment on 6/26. [facility] called for transport.  Earnest ConroyBrooke M. Clelia CroftShaw, RN

## 2016-03-13 NOTE — Care Management Note (Signed)
Case Management Note  Patient Details  Name: [name] MRN: [id number] Date of Birth: 11/19/1960  Subjective/Objective:  [facility] forms on shadow chart, confirmed home address.[facility] already aware of d/c.Nsg to call [facility] when ready for d/c.                   Action/Plan:d/c home w/HHC.   Expected Discharge Date:                  Expected Discharge Plan:  Home w Home Health Services  In-House Referral:     Discharge planning Services  CM Consult  Post Acute Care Choice:  Home Health, Durable Medical Equipment [facility] home 02, home neb machine) Choice offered to:  Patient  DME Arranged:    DME Agency:     HH Arranged:  RN, Social Work [employer] Agency:     Status of Service:  Completed, signed off  If discussed at [employer]Long Length of [employer]Stay Meetings, dates discussed:    Additional Comments:  Lanier ClamMahabir, Lantz Hermann, RN 03/13/2016, 10:15 AM

## 2016-03-13 NOTE — Discharge Summary (Addendum)
Physician Discharge Summary  [name] ZOX:[id number] DOB: 01-09-1961 DOA: 03/09/2016  PCP: Dorrene GermanEdwin A Avbuere, MD  Admit date: 03/09/2016 Discharge date: 03/13/2016  Time spent: 45 minutes  Recommendations for Outpatient Follow-up:  Patient will be discharged to home with home health RN and social work.  Patient will need to follow up with primary care provider within one week of discharge. Follow up with pulmonology. Patient should continue medications as prescribed.  Patient should follow a heart healthy/carb modified diet.   Discharge Diagnoses:  COPD with acute exacerbation Chronic diastolic heart failure Diabetes mellitus, type II Hypertension Hypothyroidism Bipolar disorder and anxiety GERD Microcytic anemia Left lower lobe pulmonary nodule  Discharge Condition: Stable  Diet recommendation: heart healthy/carb modified  Filed Weights   03/11/16 0509 03/12/16 0443 03/13/16 0522  Weight: 82.1 kg (181 lb) 81.9 kg (180 lb 8.9 oz) 82 kg (180 lb 12.4 oz)    History of present illness:  On 03/10/2016 by Dr. Marcial Pacasimothy Opyd [name] is a 55 y.o. female with medical history significant for COPD on home oxygen, chronic diastolic CHF, generalized anxiety disorder, bipolar 1 disorder, hypertension, insulin-dependent diabetes mellitus, and hypothyroidism who presents to the ED for evaluation of dyspnea and cough. Patient is chronically ill and has had increasingly frequent hospitalizations for respiratory problems. In fact, she was just discharged the day prior to her presentation following management for dyspnea which was attributed to acute on chronic diastolic CHF and COPD with acute exacerbation. Patient was diuresed during this admission, treated with DuoNeb and steroids, made a good recovery, and was discharged home on 03/08/2016 and much improved and stable condition. Unfortunately, back at home, patient's dyspnea worsened significantly overnight and was not relieved by using her  albuterol neb 3. She called EMS and DuoNeb was administered en route to the hospital 2. Patient denies orthopnea or lower extremity edema at this time. She does endorse a new cough, occasionally productive of thick white sputum. She denies chest pain or palpitations, but notes severe dyspnea, worse with minimal exertion. She has been using supplemental oxygen around the clock and was discharged home with a prednisone taper, but has not yet picked this up from the pharmacy.  Hospital Course:  COPD with acute exacerbation  -Presented with increased cough and dyspnea.  -Discharged from the hospital 03/08/16 with prednisone taper, but not able to obtain.  -Started on DuoNeb q4h prn, Solu-Medrol 60 mg IV q6h, and Levaquin 750 mg IV q24h.  -Will discharge patient with prednisone taper, Levaquin, continue nebulizer treatments and inhalers. -SW and CM consulted, advanced home health on discharge to evaluate home equipment. -Have her follow up with ENT and pulmonologist when discharged.  -Spoke with patient regarding the ability to obtain her medications, case management also spoke with the patient, prescriptions were sent to local pharmacy who does home delivery.  Chronic diastolic CHF  -No evidence for acute CHF.  -TTE (12/26/15) with EF 60-65%, severe mitral stenosis, and grade 2 diastolic dysfunction. -Continue current management of Lasix , Lopressor, losartan.  -Continue daily weights and I/O's, fluid-restrict diet.  Diabetes mellitus, type II -A1c 7.5% in April 2017, showing sub-optimal glycemic-control.  -Held glimepiride while in hospital, may continue upon discharge -Continue Lantus, ISS, CBG monitoring  Hypertension  -continue Lopressor and losartan.  Hypothyroidism  -continue Synthroid 125 mcg daily   Bipolar disorder, anxiety  -Appears to be stable, continue current management with Depakote, Abilify, and prn Xanax. -Her anxiety appears to be contributing to her recurrent  respiratory  distress symptoms.  GERD  -Stable; continue PP  Microcytic anemia  -Hgb 9.2, MCV is 74.9 on admission.  -No sign of active blood-loss.  -Iron is 16, TIBC wnl, and Ferritin wnl suggesting anemia from chronic disease.   LLL Pulmonary nodules -CT on 03/09/2016 shows pulmonary nodules in the LLL, the largest measuring 4mm. -Follow up as outpatient with PCP.  Consultants None  Procedures  None  Discharge Exam: Filed Vitals:   03/12/16 2144 03/13/16 0500  BP: 101/49 118/45  Pulse: 53 60  Temp: 98.3 F (36.8 C) 97.6 F (36.4 C)  Resp: 20 18    Exam  General: Well developed, well nourished, NAD  HEENT: NCAT,mucous membranes moist.   Cardiovascular: S1 S2 auscultated, no murmurs, RRR  Respiratory: Diminished but clear  Abdomen: Soft, nontender, nondistended, + bowel sounds  Extremities: warm dry without cyanosis clubbing or edema  Neuro: AAOx3, nonfocal  Psych: Normal affect and demeanor  Discharge Instructions      Discharge Instructions    Discharge instructions    Complete by:  As directed   Patient will be discharged to home.  Patient will need to follow up with primary care provider within one week of discharge. Follow up with pulmonology. Patient should continue medications as prescribed.  Patient should follow a heart healthy/carb modified diet.            Medication List    TAKE these medications        albuterol 108 (90 Base) MCG/ACT inhaler  Commonly known as:  PROAIR HFA  Inhale 2 puffs into the lungs every 6 (six) hours as needed for wheezing or shortness of breath. scheduled     ALPRAZolam 0.5 MG tablet  Commonly known as:  XANAX  Take 0.5 tablets (0.25 mg total) by mouth 2 (two) times daily as needed for anxiety.     ARIPiprazole 5 MG tablet  Commonly known as:  ABILIFY  Take 5 mg by mouth daily.     aspirin EC 81 MG tablet  Take 1 tablet (81 mg total) by mouth daily.     cetirizine 10 MG tablet  Commonly known as:   ZYRTEC  Take 10 mg by mouth daily.     divalproex 250 MG DR tablet  Commonly known as:  DEPAKOTE  Take 1 tablet (250 mg total) by mouth every 12 (twelve) hours.     feeding supplement (PRO-STAT SUGAR FREE 64) Liqd  Take 30 mLs by mouth 2 (two) times daily.     furosemide 40 MG tablet  Commonly known as:  LASIX  Take 1 tablet (40 mg total) by mouth 2 (two) times daily.     glimepiride 4 MG tablet  Commonly known as:  AMARYL  Take 4 mg by mouth daily with breakfast.     HYDROcodone-acetaminophen 10-325 MG tablet  Commonly known as:  NORCO  Take 1 tablet by mouth 2 (two) times daily as needed for moderate pain.     insulin glargine 100 UNIT/ML injection  Commonly known as:  LANTUS  Inject 0.45 mLs (45 Units total) into the skin at bedtime.     ipratropium-albuterol 0.5-2.5 (3) MG/3ML Soln  Commonly known as:  DUONEB  Take 3 mLs by nebulization every 2 (two) hours as needed.     levofloxacin 750 MG tablet  Commonly known as:  LEVAQUIN  Take 1 tablet (750 mg total) by mouth daily.     levothyroxine 125 MCG tablet  Commonly known as:  SYNTHROID, LEVOTHROID  Take 1 tablet (125 mcg total) by mouth daily before breakfast.     losartan 50 MG tablet  Commonly known as:  COZAAR  Take 1 tablet (50 mg total) by mouth daily.     metoprolol tartrate 25 MG tablet  Commonly known as:  LOPRESSOR  Take 0.5 tablets (12.5 mg total) by mouth 2 (two) times daily.     NOVOLOG FLEXPEN 100 UNIT/ML FlexPen  Generic drug:  insulin aspart  Inject 6-15 Units into the skin 3 (three) times daily as needed for high blood sugar (CBG >100). CBG 100-200 6-8 units, >200 15 units     omeprazole 20 MG capsule  Commonly known as:  PRILOSEC  Take 20 mg by mouth daily.     OXYGEN  Inhale into the lungs continuous. 3L     potassium chloride SA 20 MEQ tablet  Commonly known as:  K-DUR,KLOR-CON  Take 1 tablet (20 mEq total) by mouth daily.     predniSONE 10 MG tablet  Commonly known as:  DELTASONE    Take 40mg  (4 tabs) x 3 days, then taper to 30mg  (3 tabs) x 3 days, then 20mg  (2 tabs) x 3days, then 10mg  (1 tab) x 3days, then OFF.     simvastatin 10 MG tablet  Commonly known as:  ZOCOR  Take 10 mg by mouth daily.       Allergies  Allergen Reactions  . Trazodone And Nefazodone Anaphylaxis and Other (See Comments)    Shortness of breath.  . Celecoxib Nausea Only  . Ibuprofen Nausea Only   Follow-up Information    Follow up with [name], MD. Schedule an appointment as soon as possible for a visit in 1 week.   Specialty:  Internal Medicine   Why:  Hospital follow up   Contact information:   [address] [place] [address] [phone]       Follow up with [facility]. Schedule an appointment as soon as possible for a visit in 1 week.   Specialty:  Pulmonology   Why:  Hospital follow up, COPD   Contact information:   [address] [place] [address] [phone]       The results of significant diagnostics from this hospitalization (including imaging, microbiology, ancillary and laboratory) are listed below for reference.    Significant Diagnostic Studies: Dg Neck Soft Tissue  03/02/2016  CLINICAL DATA:  Stridor.  Shortness of breath. EXAM: NECK SOFT TISSUES - 1+ VIEW COMPARISON:  None. FINDINGS: There is no evidence of retropharyngeal soft tissue swelling or epiglottic enlargement. The cervical airway is unremarkable and no radio-opaque foreign body identified. Status post C4 through C7 ACDF. IMPRESSION: Negative. Electronically Signed   By: Awilda Metro M.D.   On: 03/02/2016 03:43   Dg Chest 2 View  03/09/2016  CLINICAL DATA:  C/o SOB. Pt has history of COPD and anxiety. H/o HTN, MI in 2007, emphysema, COPD, heart murmur, type II diabetes, CHF. Former smoker. EXAM: CHEST  2 VIEW COMPARISON:  03/02/2016 FINDINGS: Stable mild cardiac enlargement. Mild vascular congestion. Mildly increased markings in the perihilar regions  with focal mild left mid lung infiltrate. No pleural effusion. IMPRESSION: Significant improvement with largely resolved pulmonary edema. Left mid lung infiltrate could represent residual edema, atelectasis, or pneumonia. Based on the appearance on the lateral view atelectasis is favored. Radiographic follow-up recommended. Electronically Signed   By: Esperanza Heir M.D.   On: 03/09/2016 17:11   Ct Chest Wo Contrast  03/02/2016  CLINICAL DATA:  Dyspnea for 4 days. Patient is on home oxygen. Inpatient. EXAM: CT CHEST WITHOUT CONTRAST TECHNIQUE: Multidetector CT imaging of the chest was performed following the standard protocol without IV contrast. COMPARISON:  Chest radiograph from earlier today. 01/14/2016 chest CT. FINDINGS: Mediastinum/Nodes: Stable mild cardiomegaly. Stable trace pericardial fluid/ thickening. Left anterior descending and right coronary atherosclerosis. Great vessels are normal in course and caliber. Normal visualized thyroid. Normal esophagus. No axillary adenopathy. Mildly enlarged 1.0 cm right paratracheal node (series 2/ image 51), stable since 01/14/2016. No additional pathologically enlarged mediastinal or gross hilar nodes on this noncontrast study. 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 There are mild scattered secretions within the thoracic tracheal lumen. No pneumothorax. Trace bilateral pleural effusions. Mild paraseptal emphysema predominantly at the right lung apex. There is relatively diffuse ground-glass opacity throughout both lungs with areas of mosaic attenuation throughout both lungs. There are parenchymal bands and patchy dependent areas of consolidation in both lower lobes with associated mild volume loss. Superior segment left lower lobe 4 mm solid pulmonary nodule (series 3/ image 61), which appears  new. Upper abdomen: Unremarkable. Musculoskeletal: No aggressive appearing focal osseous lesions. Partially visualized surgical plate and interlocking and non interlocking screws in the right proximal humerus. Partially visualized left shoulder hemiarthroplasty. Partially visualized surgical hardware from ACDF in the lower cervical spine. Chronic moderate compression fracture of the T12 vertebral body. Moderate degenerative changes in the thoracic spine. IMPRESSION: 1. High-grade stenosis of the cervical trachea, probably due to scarring from prior endotracheal intubation. Mild scattered secretions within the thoracic tracheal lumen. 2. Patchy dependent areas of consolidation in both lower lobes with associated mild volume loss. Although likely partially due to atelectasis, a component of aspiration pneumonia is suspected. 3. Stable mild cardiomegaly. Diffuse ground-glass opacity throughout both lungs, suspect mild pulmonary edema. 4. Mosaic attenuation in the lungs, most commonly due to air trapping from small airways disease. 5. Two vessel coronary atherosclerosis. 6. Stable mild right paratracheal mediastinal lymphadenopathy, probably reactive. 7. Trace bilateral pleural effusions. Electronically Signed   By: Delbert Phenix M.D.   On: 03/02/2016 19:56   Ct Angio Chest Pe W/cm &/or Wo Cm  03/09/2016  CLINICAL DATA:  Shortness of breath, elevated D-dimer. EXAM: CT ANGIOGRAPHY CHEST WITH CONTRAST TECHNIQUE: Multidetector CT imaging of the chest was performed using the standard protocol during bolus administration of intravenous contrast. Multiplanar CT image reconstructions and MIPs were obtained to evaluate the vascular anatomy. CONTRAST:  100 cc Isovue 370 IV COMPARISON:  Chest radiographs earlier this day. Chest CT 1 week prior 03/02/2016 FINDINGS: There are no filling defects within the pulmonary arteries to suggest pulmonary embolus. Motion artifact limits the mid and lower lung zone evaluation, however no  findings suspicious for central pulmonary embolus. Thoracic aorta is normal in caliber. No aortic dissection. Stable cardiomegaly. Coronary artery calcifications again seen. Minimal pericardial thickening is unchanged from prior exam. Previous enlarged right lower paratracheal node has decreased in size currently 7 mm. No new mediastinal or hilar adenopathy. 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 Tracheobronchial secretions have minimally increased from recent prior. Mild emphysema. Linear and ground-glass opacities throughout both lungs with macro mosaic attenuation, not significantly changed from prior. The previous 4 mm nodule in the superior segment of the left lower lobe currently measures 3 mm and has  decreased in size image 36 series 11. More inferiorly in the left lower lobe there is a 4 mm nodule that was previously obscured image 47 series 11. Previous pleural effusions have resolved. No acute abnormality in the included upper abdomen. The esophagus is patulous and dilated proximally. Postsurgical change in the right and left proximal humerus. Compression deformity of T12 vertebral body with associated vacuum phenomenon is unchanged. Bones are under mineralized. There are no acute or suspicious osseous abnormalities. Review of the MIP images confirms the above findings. IMPRESSION: 1. No pulmonary embolus. 2. 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 Minimal increase in tracheobronchial secretions from prior. 3. Unchanged appearance of the lung parenchyma with multifocal patchy, linear, and ground-glass opacities. This likely represents combination of atelectasis, sequela of aspiration and possibly minimal pulmonary edema. 4. Left lower lobe pulmonary nodules, largest measuring 4 mm. No follow-up needed if patient is low-risk (and has no known or suspected primary neoplasm). Non-contrast chest CT can be considered in 12 months if patient is high-risk. This recommendation  follows the consensus statement: Guidelines for Management of Incidental Pulmonary Nodules Detected on CT Images:From the Fleischner Society 2017; published online before print (10.1148/radiol.[id number]). 5. Additional stable chronic findings as described. Electronically Signed   By: Rubye Oaks M.D.   On: 03/09/2016 20:23   Dg Chest Port 1 View  03/02/2016  CLINICAL DATA:  Acute onset of shortness of breath and wheezing. Initial encounter. EXAM: PORTABLE CHEST 1 VIEW COMPARISON:  Chest radiograph performed 01/18/2016 FINDINGS: The lungs are well-aerated. Vascular congestion is noted. Increased interstitial markings raise concern for pulmonary edema. There is no evidence of pleural effusion or pneumothorax. The cardiomediastinal silhouette is mildly enlarged. No acute osseous abnormalities are seen. Cervical spinal fusion hardware is partially imaged. A left shoulder arthroplasty is partially characterized. Hardware is noted along the proximal right humerus. IMPRESSION: Vascular congestion and mild cardiomegaly. Increased interstitial markings raise concern for pulmonary edema. Electronically Signed   By: Roanna Raider M.D.   On: 03/02/2016 02:41    Microbiology: Recent Results (from the past 240 hour(s))  Culture, respiratory (NON-Expectorated)     Status: None   Collection Time: 03/10/16  7:30 AM  Result Value Ref Range Status   Specimen Description SPUTUM  Final   Special Requests NONE  Final   Gram Stain   Final    NO WBC SEEN NO ORGANISMS SEEN GRAM STAIN REVIEWED-AGREE WITH RESULT V WILKINS    Culture   Final    Consistent with normal respiratory flora. Performed at [facility]    Report Status 03/12/2016 FINAL  Final  Culture, sputum-assessment     Status: None   Collection Time: 03/10/16  7:32 AM  Result Value Ref Range Status   Specimen Description SPUTUM  Final   Special Requests NONE  Final   Sputum evaluation   Final    THIS SPECIMEN IS ACCEPTABLE. RESPIRATORY  CULTURE REPORT TO FOLLOW.   Report Status 03/10/2016 FINAL  Final     Labs: Basic Metabolic Panel:  Recent Labs Lab 03/07/16 0619 03/09/16 1834 03/10/16 0219 03/11/16 0511 03/12/16 0537  NA 138 141 130* 134* 135  K 4.2 3.5 4.7 3.6 4.5  CL 90* 91* 85* 89* 91*  CO2 39* 38* 33* 37* 36*  GLUCOSE 256* 281* 499* 398* 387*  BUN 23* 25* 25* 24* 36*  CREATININE 0.86 0.92 0.94 0.81 0.84  CALCIUM 9.4 8.9 8.2* 8.9 8.9   Liver Function Tests: No results for input(s):  AST, ALT, ALKPHOS, BILITOT, PROT, ALBUMIN in the last 168 hours. No results for input(s): LIPASE, AMYLASE in the last 168 hours. No results for input(s): AMMONIA in the last 168 hours. CBC:  Recent Labs Lab 03/07/16 0619 03/09/16 1834 03/10/16 0219  WBC 10.4 13.8*  --   NEUTROABS  --  11.4*  --   HGB 8.5* 9.2*  --   HCT 30.0* 30.7* 29.1*  MCV 76.1* 74.9*  --   PLT 390 344  --    Cardiac Enzymes: No results for input(s): CKTOTAL, CKMB, CKMBINDEX, TROPONINI in the last 168 hours. BNP: BNP (last 3 results)  Recent Labs  01/14/16 1002 03/02/16 0213 03/09/16 1834  BNP 289.7* 508.6* 181.7*    ProBNP (last 3 results) No results for input(s): PROBNP in the last 8760 hours.  CBG:  Recent Labs Lab 03/12/16 0717 03/12/16 1131 03/12/16 1629 03/12/16 2153 03/13/16 0727  GLUCAP 319* 238* 173* 180* 291*       Signed:  Clois Treanor  [facility] 03/13/2016, 10:06 AM

## 2016-03-18 ENCOUNTER — Inpatient Hospital Stay (HOSPITAL_COMMUNITY)
Admission: EM | Admit: 2016-03-18 | Discharge: 2016-04-23 | DRG: 871 | Disposition: E | Payer: Medicaid Other | Attending: Internal Medicine | Admitting: Internal Medicine

## 2016-03-18 ENCOUNTER — Emergency Department (HOSPITAL_COMMUNITY): Payer: Medicaid Other

## 2016-03-18 ENCOUNTER — Other Ambulatory Visit: Payer: Self-pay

## 2016-03-18 ENCOUNTER — Encounter (HOSPITAL_COMMUNITY): Payer: Self-pay

## 2016-03-18 DIAGNOSIS — I252 Old myocardial infarction: Secondary | ICD-10-CM

## 2016-03-18 DIAGNOSIS — R0600 Dyspnea, unspecified: Secondary | ICD-10-CM | POA: Diagnosis not present

## 2016-03-18 DIAGNOSIS — Z7189 Other specified counseling: Secondary | ICD-10-CM | POA: Diagnosis present

## 2016-03-18 DIAGNOSIS — J449 Chronic obstructive pulmonary disease, unspecified: Secondary | ICD-10-CM | POA: Diagnosis present

## 2016-03-18 DIAGNOSIS — J189 Pneumonia, unspecified organism: Secondary | ICD-10-CM | POA: Diagnosis present

## 2016-03-18 DIAGNOSIS — IMO0001 Reserved for inherently not codable concepts without codable children: Secondary | ICD-10-CM | POA: Diagnosis present

## 2016-03-18 DIAGNOSIS — Z794 Long term (current) use of insulin: Secondary | ICD-10-CM

## 2016-03-18 DIAGNOSIS — E11649 Type 2 diabetes mellitus with hypoglycemia without coma: Secondary | ICD-10-CM | POA: Diagnosis not present

## 2016-03-18 DIAGNOSIS — M19042 Primary osteoarthritis, left hand: Secondary | ICD-10-CM | POA: Diagnosis present

## 2016-03-18 DIAGNOSIS — T405X1A Poisoning by cocaine, accidental (unintentional), initial encounter: Secondary | ICD-10-CM

## 2016-03-18 DIAGNOSIS — R569 Unspecified convulsions: Secondary | ICD-10-CM | POA: Diagnosis present

## 2016-03-18 DIAGNOSIS — E039 Hypothyroidism, unspecified: Secondary | ICD-10-CM | POA: Diagnosis present

## 2016-03-18 DIAGNOSIS — R079 Chest pain, unspecified: Secondary | ICD-10-CM | POA: Diagnosis not present

## 2016-03-18 DIAGNOSIS — E860 Dehydration: Secondary | ICD-10-CM | POA: Diagnosis present

## 2016-03-18 DIAGNOSIS — D638 Anemia in other chronic diseases classified elsewhere: Secondary | ICD-10-CM | POA: Diagnosis present

## 2016-03-18 DIAGNOSIS — J440 Chronic obstructive pulmonary disease with acute lower respiratory infection: Secondary | ICD-10-CM | POA: Diagnosis present

## 2016-03-18 DIAGNOSIS — J9622 Acute and chronic respiratory failure with hypercapnia: Secondary | ICD-10-CM | POA: Diagnosis not present

## 2016-03-18 DIAGNOSIS — I110 Hypertensive heart disease with heart failure: Secondary | ICD-10-CM | POA: Diagnosis present

## 2016-03-18 DIAGNOSIS — Z79899 Other long term (current) drug therapy: Secondary | ICD-10-CM

## 2016-03-18 DIAGNOSIS — Z9119 Patient's noncompliance with other medical treatment and regimen: Secondary | ICD-10-CM | POA: Diagnosis not present

## 2016-03-18 DIAGNOSIS — I952 Hypotension due to drugs: Secondary | ICD-10-CM | POA: Diagnosis present

## 2016-03-18 DIAGNOSIS — N179 Acute kidney failure, unspecified: Secondary | ICD-10-CM | POA: Diagnosis present

## 2016-03-18 DIAGNOSIS — Z515 Encounter for palliative care: Secondary | ICD-10-CM | POA: Diagnosis not present

## 2016-03-18 DIAGNOSIS — F1910 Other psychoactive substance abuse, uncomplicated: Secondary | ICD-10-CM | POA: Diagnosis present

## 2016-03-18 DIAGNOSIS — J441 Chronic obstructive pulmonary disease with (acute) exacerbation: Secondary | ICD-10-CM | POA: Diagnosis present

## 2016-03-18 DIAGNOSIS — Z66 Do not resuscitate: Secondary | ICD-10-CM | POA: Diagnosis present

## 2016-03-18 DIAGNOSIS — F319 Bipolar disorder, unspecified: Secondary | ICD-10-CM | POA: Diagnosis present

## 2016-03-18 DIAGNOSIS — G8929 Other chronic pain: Secondary | ICD-10-CM | POA: Diagnosis present

## 2016-03-18 DIAGNOSIS — Z9981 Dependence on supplemental oxygen: Secondary | ICD-10-CM | POA: Diagnosis not present

## 2016-03-18 DIAGNOSIS — E873 Alkalosis: Secondary | ICD-10-CM | POA: Insufficient documentation

## 2016-03-18 DIAGNOSIS — J984 Other disorders of lung: Secondary | ICD-10-CM | POA: Diagnosis present

## 2016-03-18 DIAGNOSIS — F329 Major depressive disorder, single episode, unspecified: Secondary | ICD-10-CM | POA: Diagnosis present

## 2016-03-18 DIAGNOSIS — J9621 Acute and chronic respiratory failure with hypoxia: Secondary | ICD-10-CM | POA: Diagnosis present

## 2016-03-18 DIAGNOSIS — F339 Major depressive disorder, recurrent, unspecified: Secondary | ICD-10-CM | POA: Diagnosis present

## 2016-03-18 DIAGNOSIS — IMO0002 Reserved for concepts with insufficient information to code with codable children: Secondary | ICD-10-CM | POA: Diagnosis present

## 2016-03-18 DIAGNOSIS — Z886 Allergy status to analgesic agent status: Secondary | ICD-10-CM

## 2016-03-18 DIAGNOSIS — M479 Spondylosis, unspecified: Secondary | ICD-10-CM | POA: Diagnosis present

## 2016-03-18 DIAGNOSIS — E1142 Type 2 diabetes mellitus with diabetic polyneuropathy: Secondary | ICD-10-CM | POA: Diagnosis present

## 2016-03-18 DIAGNOSIS — I050 Rheumatic mitral stenosis: Secondary | ICD-10-CM | POA: Diagnosis present

## 2016-03-18 DIAGNOSIS — E785 Hyperlipidemia, unspecified: Secondary | ICD-10-CM | POA: Diagnosis present

## 2016-03-18 DIAGNOSIS — E1165 Type 2 diabetes mellitus with hyperglycemia: Secondary | ICD-10-CM | POA: Diagnosis present

## 2016-03-18 DIAGNOSIS — K219 Gastro-esophageal reflux disease without esophagitis: Secondary | ICD-10-CM | POA: Diagnosis present

## 2016-03-18 DIAGNOSIS — E118 Type 2 diabetes mellitus with unspecified complications: Secondary | ICD-10-CM

## 2016-03-18 DIAGNOSIS — J398 Other specified diseases of upper respiratory tract: Secondary | ICD-10-CM | POA: Diagnosis present

## 2016-03-18 DIAGNOSIS — F411 Generalized anxiety disorder: Secondary | ICD-10-CM | POA: Diagnosis present

## 2016-03-18 DIAGNOSIS — J9600 Acute respiratory failure, unspecified whether with hypoxia or hypercapnia: Secondary | ICD-10-CM

## 2016-03-18 DIAGNOSIS — Y95 Nosocomial condition: Secondary | ICD-10-CM | POA: Diagnosis present

## 2016-03-18 DIAGNOSIS — I5032 Chronic diastolic (congestive) heart failure: Secondary | ICD-10-CM | POA: Diagnosis present

## 2016-03-18 DIAGNOSIS — Z7982 Long term (current) use of aspirin: Secondary | ICD-10-CM

## 2016-03-18 DIAGNOSIS — R5381 Other malaise: Secondary | ICD-10-CM | POA: Diagnosis present

## 2016-03-18 DIAGNOSIS — F32A Depression, unspecified: Secondary | ICD-10-CM | POA: Diagnosis present

## 2016-03-18 DIAGNOSIS — J9610 Chronic respiratory failure, unspecified whether with hypoxia or hypercapnia: Secondary | ICD-10-CM | POA: Diagnosis present

## 2016-03-18 DIAGNOSIS — A419 Sepsis, unspecified organism: Principal | ICD-10-CM | POA: Diagnosis present

## 2016-03-18 DIAGNOSIS — R0603 Acute respiratory distress: Secondary | ICD-10-CM | POA: Insufficient documentation

## 2016-03-18 DIAGNOSIS — F1410 Cocaine abuse, uncomplicated: Secondary | ICD-10-CM | POA: Diagnosis present

## 2016-03-18 DIAGNOSIS — I10 Essential (primary) hypertension: Secondary | ICD-10-CM | POA: Diagnosis present

## 2016-03-18 DIAGNOSIS — M19012 Primary osteoarthritis, left shoulder: Secondary | ICD-10-CM | POA: Diagnosis present

## 2016-03-18 DIAGNOSIS — M19041 Primary osteoarthritis, right hand: Secondary | ICD-10-CM | POA: Diagnosis present

## 2016-03-18 DIAGNOSIS — F4001 Agoraphobia with panic disorder: Secondary | ICD-10-CM | POA: Diagnosis present

## 2016-03-18 DIAGNOSIS — M1712 Unilateral primary osteoarthritis, left knee: Secondary | ICD-10-CM | POA: Diagnosis present

## 2016-03-18 DIAGNOSIS — Z87891 Personal history of nicotine dependence: Secondary | ICD-10-CM

## 2016-03-18 DIAGNOSIS — J9620 Acute and chronic respiratory failure, unspecified whether with hypoxia or hypercapnia: Secondary | ICD-10-CM | POA: Diagnosis present

## 2016-03-18 LAB — I-STAT ARTERIAL BLOOD GAS, ED
Acid-Base Excess: 9 mmol/L — ABNORMAL HIGH (ref 0.0–2.0)
BICARBONATE: 37.3 meq/L — AB (ref 20.0–24.0)
O2 Saturation: 98 %
PCO2 ART: 68.3 mmHg — AB (ref 35.0–45.0)
TCO2: 39 mmol/L (ref 0–100)
pH, Arterial: 7.345 — ABNORMAL LOW (ref 7.350–7.450)
pO2, Arterial: 124 mmHg — ABNORMAL HIGH (ref 80.0–100.0)

## 2016-03-18 LAB — STREP PNEUMONIAE URINARY ANTIGEN: Strep Pneumo Urinary Antigen: NEGATIVE

## 2016-03-18 LAB — COMPREHENSIVE METABOLIC PANEL
ALK PHOS: 77 U/L (ref 38–126)
ALT: 12 U/L — ABNORMAL LOW (ref 14–54)
ALT: 10 U/L — ABNORMAL LOW (ref 14–54)
ANION GAP: 7 (ref 5–15)
AST: 18 U/L (ref 15–41)
AST: 12 U/L — ABNORMAL LOW (ref 15–41)
Albumin: 3.5 g/dL (ref 3.5–5.0)
Albumin: 3.2 g/dL — ABNORMAL LOW (ref 3.5–5.0)
Alkaline Phosphatase: 86 U/L (ref 38–126)
Anion gap: 13 (ref 5–15)
BILIRUBIN TOTAL: 1.3 mg/dL — AB (ref 0.3–1.2)
BUN: 28 mg/dL — ABNORMAL HIGH (ref 6–20)
BUN: 23 mg/dL — ABNORMAL HIGH (ref 6–20)
CALCIUM: 8.3 mg/dL — AB (ref 8.9–10.3)
CO2: 30 mmol/L (ref 22–32)
CO2: 30 mmol/L (ref 22–32)
Calcium: 8.9 mg/dL (ref 8.9–10.3)
Chloride: 95 mmol/L — ABNORMAL LOW (ref 101–111)
Chloride: 103 mmol/L (ref 101–111)
Creatinine, Ser: 1.15 mg/dL — ABNORMAL HIGH (ref 0.44–1.00)
Creatinine, Ser: 0.87 mg/dL (ref 0.44–1.00)
GFR calc Af Amer: >60 mL/min (ref >60)
GFR calc non Af Amer: 52 mL/min — ABNORMAL LOW (ref >60)
Glucose, Bld: 478 mg/dL — ABNORMAL HIGH (ref 65–99)
Glucose, Bld: 432 mg/dL — ABNORMAL HIGH (ref 65–99)
Potassium: 4.2 mmol/L (ref 3.5–5.1)
Potassium: 4.2 mmol/L (ref 3.5–5.1)
Sodium: 138 mmol/L (ref 135–145)
Sodium: 140 mmol/L (ref 135–145)
TOTAL PROTEIN: 5.9 g/dL — AB (ref 6.5–8.1)
Total Bilirubin: 1 mg/dL (ref 0.3–1.2)
Total Protein: 6.6 g/dL (ref 6.5–8.1)

## 2016-03-18 LAB — BLOOD GAS, ARTERIAL
ACID-BASE EXCESS: 4.2 mmol/L — AB (ref 0.0–2.0)
Acid-Base Excess: 6.9 mmol/L — ABNORMAL HIGH (ref 0.0–2.0)
BICARBONATE: 29.4 meq/L — AB (ref 20.0–24.0)
BICARBONATE: 32.4 meq/L — AB (ref 20.0–24.0)
DRAWN BY: 39898
Delivery systems: POSITIVE
Delivery systems: POSITIVE
Drawn by: 275531
EXPIRATORY PAP: 5
Expiratory PAP: 5
FIO2: 0.4
FIO2: 0.4
Inspiratory PAP: 10
Inspiratory PAP: 10
LHR: 10 {breaths}/min
O2 SAT: 98.9 %
O2 SAT: 99.1 %
PATIENT TEMPERATURE: 98.6
PCO2 ART: 54.3 mmHg — AB (ref 35.0–45.0)
PCO2 ART: 60.2 mmHg — AB (ref 35.0–45.0)
PH ART: 7.35 (ref 7.350–7.450)
PO2 ART: 125 mmHg — AB (ref 80.0–100.0)
Patient temperature: 98.6
RATE: 14 resp/min
TCO2: 31.1 mmol/L (ref 0–100)
TCO2: 34.2 mmol/L (ref 0–100)
pH, Arterial: 7.353 (ref 7.350–7.450)
pO2, Arterial: 136 mmHg — ABNORMAL HIGH (ref 80.0–100.0)

## 2016-03-18 LAB — CBC WITH DIFFERENTIAL/PLATELET
Basophils Absolute: 0 10*3/uL (ref 0.0–0.1)
Basophils Relative: 0 %
Eosinophils Absolute: 0 10*3/uL (ref 0.0–0.7)
Eosinophils Relative: 0 %
HCT: 34.5 % — ABNORMAL LOW (ref 36.0–46.0)
Hemoglobin: 10.2 g/dL — ABNORMAL LOW (ref 12.0–15.0)
Lymphocytes Relative: 4 %
Lymphs Abs: 0.5 10*3/uL — ABNORMAL LOW (ref 0.7–4.0)
MCH: 22.4 pg — ABNORMAL LOW (ref 26.0–34.0)
MCHC: 29.6 g/dL — ABNORMAL LOW (ref 30.0–36.0)
MCV: 75.8 fL — ABNORMAL LOW (ref 78.0–100.0)
Monocytes Absolute: 0.5 10*3/uL (ref 0.1–1.0)
Monocytes Relative: 4 %
Neutro Abs: 14 10*3/uL — ABNORMAL HIGH (ref 1.7–7.7)
Neutrophils Relative %: 93 %
Platelets: 373 10*3/uL (ref 150–400)
RBC: 4.55 MIL/uL (ref 3.87–5.11)
RDW: 18.3 % — ABNORMAL HIGH (ref 11.5–15.5)
WBC: 15.1 10*3/uL — ABNORMAL HIGH (ref 4.0–10.5)

## 2016-03-18 LAB — URINALYSIS, ROUTINE W REFLEX MICROSCOPIC
Bilirubin Urine: NEGATIVE
Glucose, UA: >1000 mg/dL — AB
Ketones, ur: NEGATIVE mg/dL
Nitrite: NEGATIVE
Protein, ur: NEGATIVE mg/dL
Specific Gravity, Urine: 1.019 (ref 1.005–1.030)
pH: 6 (ref 5.0–8.0)

## 2016-03-18 LAB — GLUCOSE, CAPILLARY
GLUCOSE-CAPILLARY: 404 mg/dL — AB (ref 65–99)
Glucose-Capillary: 332 mg/dL — ABNORMAL HIGH (ref 65–99)

## 2016-03-18 LAB — MRSA PCR SCREENING: MRSA BY PCR: NEGATIVE

## 2016-03-18 LAB — URINE MICROSCOPIC-ADD ON

## 2016-03-18 LAB — PROTIME-INR
INR: 0.99 (ref 0.00–1.49)
PROTHROMBIN TIME: 13.3 s (ref 11.6–15.2)

## 2016-03-18 LAB — BRAIN NATRIURETIC PEPTIDE
B Natriuretic Peptide: 350.7 pg/mL — ABNORMAL HIGH (ref 0.0–100.0)
B Natriuretic Peptide: 1079.6 pg/mL — ABNORMAL HIGH (ref 0.0–100.0)

## 2016-03-18 LAB — LACTIC ACID, PLASMA
LACTIC ACID, VENOUS: 2.7 mmol/L — AB (ref 0.5–2.0)
Lactic Acid, Venous: 1.9 mmol/L (ref 0.5–2.0)

## 2016-03-18 LAB — I-STAT CG4 LACTIC ACID, ED
Lactic Acid, Venous: 3.52 mmol/L (ref 0.5–2.0)
Lactic Acid, Venous: 3.69 mmol/L (ref 0.5–2.0)

## 2016-03-18 LAB — APTT: aPTT: 22 seconds — ABNORMAL LOW (ref 24–37)

## 2016-03-18 LAB — I-STAT TROPONIN, ED: TROPONIN I, POC: 0 ng/mL (ref 0.00–0.08)

## 2016-03-18 LAB — PROCALCITONIN: PROCALCITONIN: 0.19 ng/mL

## 2016-03-18 MED ORDER — VANCOMYCIN HCL IN DEXTROSE 1-5 GM/200ML-% IV SOLN: 1000.0000 mg | Freq: Once | INTRAVENOUS | Status: DC

## 2016-03-18 MED ORDER — INSULIN ASPART 100 UNIT/ML ~~LOC~~ SOLN: 0.0000 [IU] | Freq: Three times a day (TID) | SUBCUTANEOUS | Status: DC

## 2016-03-18 MED ORDER — DIVALPROEX SODIUM 250 MG PO DR TAB
250.0000 mg | DELAYED_RELEASE_TABLET | Freq: Two times a day (BID) | ORAL | Status: DC
  Filled 2016-03-18: qty 1

## 2016-03-18 MED ORDER — INSULIN GLARGINE 100 UNIT/ML ~~LOC~~ SOLN
12.0000 [IU] | Freq: Once | SUBCUTANEOUS | Status: AC
  Administered 2016-03-18: 12 [IU] via SUBCUTANEOUS
  Filled 2016-03-18: qty 0.12

## 2016-03-18 MED ORDER — SODIUM CHLORIDE 0.9 % IV BOLUS (SEPSIS)
1000.0000 mL | Freq: Once | INTRAVENOUS | Status: AC
  Administered 2016-03-18: 1000 mL via INTRAVENOUS

## 2016-03-18 MED ORDER — ONDANSETRON HCL 4 MG PO TABS: 4.0000 mg | ORAL_TABLET | Freq: Four times a day (QID) | ORAL | Status: DC | PRN

## 2016-03-18 MED ORDER — DEXTROSE 5 % IV SOLN
2.0000 g | INTRAVENOUS | Status: DC
  Filled 2016-03-18: qty 2

## 2016-03-18 MED ORDER — HYDROCODONE-ACETAMINOPHEN 10-325 MG PO TABS
1.0000 | ORAL_TABLET | Freq: Two times a day (BID) | ORAL | Status: DC | PRN
  Administered 2016-03-19 – 2016-03-25 (×7): 1 via ORAL
  Filled 2016-03-18 (×7): qty 1

## 2016-03-18 MED ORDER — POTASSIUM CHLORIDE CRYS ER 20 MEQ PO TBCR
20.0000 meq | EXTENDED_RELEASE_TABLET | Freq: Every day | ORAL | Status: DC
  Administered 2016-03-21 – 2016-03-25 (×5): 20 meq via ORAL
  Filled 2016-03-18 (×5): qty 1

## 2016-03-18 MED ORDER — TRAZODONE HCL 50 MG PO TABS
25.0000 mg | ORAL_TABLET | Freq: Every evening | ORAL | Status: DC | PRN
  Administered 2016-03-19 – 2016-03-23 (×2): 25 mg via ORAL
  Filled 2016-03-18 (×2): qty 1

## 2016-03-18 MED ORDER — DEXAMETHASONE SODIUM PHOSPHATE 10 MG/ML IJ SOLN
10.0000 mg | Freq: Once | INTRAMUSCULAR | Status: AC
  Administered 2016-03-18: 10 mg via INTRAVENOUS
  Filled 2016-03-18: qty 1

## 2016-03-18 MED ORDER — LORAZEPAM 2 MG/ML IJ SOLN
0.5000 mg | Freq: Once | INTRAMUSCULAR | Status: AC
  Administered 2016-03-18: 0.5 mg via INTRAVENOUS
  Filled 2016-03-18: qty 1

## 2016-03-18 MED ORDER — PANTOPRAZOLE SODIUM 40 MG PO TBEC
40.0000 mg | DELAYED_RELEASE_TABLET | Freq: Every day | ORAL | Status: DC
  Administered 2016-03-21 – 2016-03-27 (×7): 40 mg via ORAL
  Filled 2016-03-18 (×7): qty 1

## 2016-03-18 MED ORDER — SODIUM CHLORIDE 0.9% FLUSH
3.0000 mL | Freq: Two times a day (BID) | INTRAVENOUS | Status: DC
  Administered 2016-03-18 – 2016-03-28 (×14): 3 mL via INTRAVENOUS

## 2016-03-18 MED ORDER — FUROSEMIDE 10 MG/ML IJ SOLN
20.0000 mg | Freq: Two times a day (BID) | INTRAMUSCULAR | Status: DC
  Administered 2016-03-18 – 2016-03-19 (×3): 20 mg via INTRAVENOUS
  Filled 2016-03-18 (×3): qty 2

## 2016-03-18 MED ORDER — LEVOTHYROXINE SODIUM 125 MCG PO TABS
125.0000 ug | ORAL_TABLET | Freq: Every day | ORAL | Status: DC
  Filled 2016-03-18 (×2): qty 1

## 2016-03-18 MED ORDER — SENNOSIDES-DOCUSATE SODIUM 8.6-50 MG PO TABS: 1.0000 | ORAL_TABLET | Freq: Every evening | ORAL | Status: DC | PRN

## 2016-03-18 MED ORDER — INSULIN GLARGINE 100 UNIT/ML ~~LOC~~ SOLN
25.0000 [IU] | Freq: Every day | SUBCUTANEOUS | Status: DC
  Filled 2016-03-18: qty 0.25

## 2016-03-18 MED ORDER — INSULIN ASPART 100 UNIT/ML ~~LOC~~ SOLN
0.0000 [IU] | SUBCUTANEOUS | Status: DC
  Administered 2016-03-19: 3 [IU] via SUBCUTANEOUS
  Administered 2016-03-19 (×2): 5 [IU] via SUBCUTANEOUS
  Administered 2016-03-19: 11 [IU] via SUBCUTANEOUS
  Administered 2016-03-19 (×2): 5 [IU] via SUBCUTANEOUS
  Administered 2016-03-20: 3 [IU] via SUBCUTANEOUS
  Administered 2016-03-20: 5 [IU] via SUBCUTANEOUS

## 2016-03-18 MED ORDER — BISACODYL 10 MG RE SUPP: 10.0000 mg | Freq: Every day | RECTAL | Status: DC | PRN

## 2016-03-18 MED ORDER — VALPROATE SODIUM 500 MG/5ML IV SOLN
250.0000 mg | Freq: Two times a day (BID) | INTRAVENOUS | Status: DC
  Administered 2016-03-18 – 2016-03-28 (×20): 250 mg via INTRAVENOUS
  Filled 2016-03-18 (×25): qty 2.5

## 2016-03-18 MED ORDER — ASPIRIN EC 81 MG PO TBEC
81.0000 mg | DELAYED_RELEASE_TABLET | Freq: Every day | ORAL | Status: DC
  Administered 2016-03-21 – 2016-03-27 (×7): 81 mg via ORAL
  Filled 2016-03-18 (×7): qty 1

## 2016-03-18 MED ORDER — MORPHINE SULFATE (PF) 2 MG/ML IV SOLN
1.0000 mg | INTRAVENOUS | Status: DC | PRN
  Administered 2016-03-18 – 2016-03-21 (×7): 1 mg via INTRAVENOUS
  Filled 2016-03-18 (×8): qty 1

## 2016-03-18 MED ORDER — ALBUTEROL (5 MG/ML) CONTINUOUS INHALATION SOLN
10.0000 mg/h | INHALATION_SOLUTION | Freq: Once | RESPIRATORY_TRACT | Status: DC
  Filled 2016-03-18: qty 20

## 2016-03-18 MED ORDER — MAGNESIUM SULFATE 2 GM/50ML IV SOLN
2.0000 g | Freq: Once | INTRAVENOUS | Status: AC
  Administered 2016-03-18: 2 g via INTRAVENOUS
  Filled 2016-03-18: qty 50

## 2016-03-18 MED ORDER — ONDANSETRON HCL 4 MG/2ML IJ SOLN: 4.0000 mg | Freq: Four times a day (QID) | INTRAMUSCULAR | Status: DC | PRN

## 2016-03-18 MED ORDER — FUROSEMIDE 10 MG/ML IJ SOLN
80.0000 mg | Freq: Once | INTRAMUSCULAR | Status: AC
  Administered 2016-03-18: 80 mg via INTRAVENOUS
  Filled 2016-03-18: qty 8

## 2016-03-18 MED ORDER — MAGNESIUM CITRATE PO SOLN: 1.0000 | Freq: Once | ORAL | Status: DC | PRN

## 2016-03-18 MED ORDER — METOPROLOL TARTRATE 12.5 MG HALF TABLET: 12.5000 mg | ORAL_TABLET | Freq: Two times a day (BID) | ORAL | Status: DC

## 2016-03-18 MED ORDER — METHYLPREDNISOLONE SODIUM SUCC 125 MG IJ SOLR
60.0000 mg | Freq: Four times a day (QID) | INTRAMUSCULAR | Status: DC
  Administered 2016-03-18 – 2016-03-19 (×3): 60 mg via INTRAVENOUS
  Filled 2016-03-18 (×3): qty 2

## 2016-03-18 MED ORDER — CEFEPIME HCL 2 G IJ SOLR
2.0000 g | Freq: Once | INTRAMUSCULAR | Status: AC
  Administered 2016-03-18: 2 g via INTRAVENOUS
  Filled 2016-03-18: qty 2

## 2016-03-18 MED ORDER — LORAZEPAM 2 MG/ML IJ SOLN
0.5000 mg | Freq: Four times a day (QID) | INTRAMUSCULAR | Status: AC | PRN
  Administered 2016-03-18 – 2016-03-19 (×2): 0.5 mg via INTRAVENOUS
  Filled 2016-03-18 (×2): qty 1

## 2016-03-18 MED ORDER — ENOXAPARIN SODIUM 40 MG/0.4ML ~~LOC~~ SOLN
40.0000 mg | SUBCUTANEOUS | Status: DC
  Administered 2016-03-18 – 2016-03-26 (×9): 40 mg via SUBCUTANEOUS
  Filled 2016-03-18 (×10): qty 0.4

## 2016-03-18 MED ORDER — SODIUM CHLORIDE 0.9 % IV SOLN
INTRAVENOUS | Status: DC
Start: 2016-03-18 — End: 2016-03-28
  Administered 2016-03-18 – 2016-03-24 (×2): via INTRAVENOUS

## 2016-03-18 MED ORDER — FUROSEMIDE 40 MG PO TABS: 40.0000 mg | ORAL_TABLET | Freq: Two times a day (BID) | ORAL | Status: DC

## 2016-03-18 MED ORDER — VANCOMYCIN HCL 10 G IV SOLR
1750.0000 mg | Freq: Once | INTRAVENOUS | Status: AC
  Administered 2016-03-18: 1750 mg via INTRAVENOUS
  Filled 2016-03-18: qty 1750

## 2016-03-18 MED ORDER — VANCOMYCIN HCL IN DEXTROSE 750-5 MG/150ML-% IV SOLN
750.0000 mg | Freq: Two times a day (BID) | INTRAVENOUS | Status: DC
  Administered 2016-03-19: 750 mg via INTRAVENOUS
  Filled 2016-03-18 (×4): qty 150

## 2016-03-18 MED ORDER — INSULIN ASPART 100 UNIT/ML ~~LOC~~ SOLN
16.0000 [IU] | Freq: Once | SUBCUTANEOUS | Status: AC
  Administered 2016-03-18: 16 [IU] via SUBCUTANEOUS

## 2016-03-18 MED ORDER — SIMVASTATIN 10 MG PO TABS
10.0000 mg | ORAL_TABLET | Freq: Every evening | ORAL | Status: DC
  Administered 2016-03-21 – 2016-03-25 (×5): 10 mg via ORAL
  Filled 2016-03-18 (×8): qty 1

## 2016-03-18 MED ORDER — SODIUM CHLORIDE 0.9 % IV BOLUS (SEPSIS): 500.0000 mL | Freq: Once | INTRAVENOUS | Status: DC

## 2016-03-18 MED ORDER — ALPRAZOLAM 0.25 MG PO TABS: 0.2500 mg | ORAL_TABLET | Freq: Two times a day (BID) | ORAL | Status: DC | PRN

## 2016-03-18 MED ORDER — SODIUM CHLORIDE 0.9 % IV BOLUS (SEPSIS)
250.0000 mL | Freq: Once | INTRAVENOUS | Status: AC
  Administered 2016-03-18: 250 mL via INTRAVENOUS

## 2016-03-18 MED ORDER — ARIPIPRAZOLE 10 MG PO TABS
5.0000 mg | ORAL_TABLET | Freq: Every day | ORAL | Status: DC
  Administered 2016-03-21 – 2016-03-27 (×7): 5 mg via ORAL
  Filled 2016-03-18 (×7): qty 1

## 2016-03-18 MED ORDER — INSULIN ASPART 100 UNIT/ML ~~LOC~~ SOLN
0.0000 [IU] | Freq: Three times a day (TID) | SUBCUTANEOUS | Status: DC
  Administered 2016-03-18: 11 [IU] via SUBCUTANEOUS

## 2016-03-18 MED ORDER — METOPROLOL TARTRATE 5 MG/5ML IV SOLN
2.5000 mg | Freq: Four times a day (QID) | INTRAVENOUS | Status: DC
Start: 2016-03-18 — End: 2016-03-20
  Administered 2016-03-18 – 2016-03-20 (×4): 2.5 mg via INTRAVENOUS
  Filled 2016-03-18 (×4): qty 5

## 2016-03-18 MED ORDER — LOSARTAN POTASSIUM 50 MG PO TABS: 50.0000 mg | ORAL_TABLET | Freq: Every day | ORAL | Status: DC

## 2016-03-18 NOTE — Progress Notes (Signed)
RT placed patient on bipap with 50% and 12/6 as her settings. Patient did not tolerate well. She pulled at the mask until I took it off. RT will continue to monitor. RN aware.

## 2016-03-18 NOTE — ED Notes (Signed)
Pt called out st that she needed to go to the bathroom. Offered pt bedpan, st that it was not safe for her to use bedside commode do to SOB. Pt refused bedpan and said "forget about it" Explained to pt again that we really needed to try the bedpan, pt refused.

## 2016-03-18 NOTE — Progress Notes (Signed)
Patient wishes code stats to be changed to full code.

## 2016-03-18 NOTE — ED Notes (Signed)
Per verbal order of [place], [place]. Pt assisted to bedside commode with the help of this NT and Brooke, Charity fundraiserN.

## 2016-03-18 NOTE — ED Notes (Signed)
PA made aware patient BP dropping.

## 2016-03-18 NOTE — ED Notes (Signed)
Pt is requesting bi-pap mask off primary RN aware

## 2016-03-18 NOTE — ED Notes (Addendum)
Per EMS - pt seen 28mo ago and admitted for pneumonia. Pt reports shortness of breath x 2-3 weeks, worse last 2 days. Family reported pt becoming unresponsive prior to EMS arrival. Pt given breathing tx by family prior to EMS arrival. Pt labored respirations, answering yes/no questions at this time d/t shortness of breath. CBG 400+. Unremarkable 12-lead. Hx CHF, COPD.  SpO2 mid-60s on at-home [place] O2 for Fire. 88-89% on first breathing tx w/ EMS. SpO2 upper 90s w/ second breathing tx.  125 solumedrol, 10 albuterol, .5 atrovent

## 2016-03-18 NOTE — Progress Notes (Signed)
Pt transported from ER to 3S08 on Bipap without complication.
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
## 2016-03-18 NOTE — ED Notes (Signed)
Pt labored breathing w/ accessory muscle use when sitting on bedside commode and in stretcher. Trey PaulaJeff, [place]PA and Dr. Jodi MourningZavitz aware and at bedside.

## 2016-03-18 NOTE — Progress Notes (Addendum)
Pharmacy Antibiotic Note  [name] is a 55 y.o. female admitted on 2016-08-07 with pneumonia.  Pharmacy has been consulted for vanc/cefepime dosing. Afeb, wbc 15.1, SCr 1.15 on admit (0.84 6 days ago), CrCl~48.  Plan: Cefepime 2g IV x1; then 2g IV q24h Vanc 1750mg  IV x1; then 750mg  IV q12h Monitor clinical progress, c/s, renal function, abx plan/LOT VT@SS  as indicated Monitor SCr trend - may need to adjust abx dosing     Temp (24hrs), Avg:97.9 F (36.6 C), Min:97.9 F (36.6 C), Max:97.9 F (36.6 C)   Recent Labs Lab 03/12/16 0537 2016-09-22 0945 2016-09-22 0955  WBC  --  15.1*  --   CREATININE 0.84  --   --   LATICACIDVEN  --   --  3.52*    Estimated Creatinine Clearance: 66.1 mL/min (by C-G formula based on Cr of 0.84).    Allergies  Allergen Reactions  . Trazodone And Nefazodone Anaphylaxis and Other (See Comments)    Shortness of breath.  . Celecoxib Nausea Only  . Ibuprofen Nausea Only    Antimicrobials this admission: 6/26 vanc >>  6/26 cefepime >>   Dose adjustments this admission:   Microbiology results: 6/26 BCx:  6/26 UCx:     Babs BertinHaley Chi Garlow, PharmD, BCPS Clinical Pharmacist Pager [phone] 2016-08-07 10:16 AM

## 2016-03-18 NOTE — Progress Notes (Addendum)
Physician notified: Konrad DoloresMerrell At: [id number]  Regarding: pH 7.35, CO2 54.3, O2 125, HCO3 29.4 on BiPAP 40%. [facility] Awaiting return response.   Returned Response at: 1713  Order(s): Off BiPAP to [place] as tolerated. OK to eat and take PO meds.    1730: Pt having serious stridor, expiratory wheezing, hoarse raspy voice. Pt grey while attempting BSC-refused bedpan. SpO2 86-93, placed back on BiPAP. HR 120s. Rapid Response notified to place pt on their radar. Orders placed.  1813: Pt now wants to be a full code.

## 2016-03-18 NOTE — ED Notes (Signed)
Patient resting at this time.
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
## 2016-03-19 ENCOUNTER — Inpatient Hospital Stay (HOSPITAL_COMMUNITY): Payer: Medicaid Other

## 2016-03-19 DIAGNOSIS — I5032 Chronic diastolic (congestive) heart failure: Secondary | ICD-10-CM | POA: Diagnosis present

## 2016-03-19 DIAGNOSIS — F329 Major depressive disorder, single episode, unspecified: Secondary | ICD-10-CM

## 2016-03-19 DIAGNOSIS — E118 Type 2 diabetes mellitus with unspecified complications: Secondary | ICD-10-CM

## 2016-03-19 DIAGNOSIS — I10 Essential (primary) hypertension: Secondary | ICD-10-CM

## 2016-03-19 DIAGNOSIS — F411 Generalized anxiety disorder: Secondary | ICD-10-CM

## 2016-03-19 DIAGNOSIS — F1910 Other psychoactive substance abuse, uncomplicated: Secondary | ICD-10-CM | POA: Diagnosis present

## 2016-03-19 DIAGNOSIS — R079 Chest pain, unspecified: Secondary | ICD-10-CM

## 2016-03-19 DIAGNOSIS — IMO0002 Reserved for concepts with insufficient information to code with codable children: Secondary | ICD-10-CM | POA: Diagnosis present

## 2016-03-19 DIAGNOSIS — J984 Other disorders of lung: Secondary | ICD-10-CM | POA: Diagnosis present

## 2016-03-19 DIAGNOSIS — J9621 Acute and chronic respiratory failure with hypoxia: Secondary | ICD-10-CM | POA: Diagnosis present

## 2016-03-19 DIAGNOSIS — J441 Chronic obstructive pulmonary disease with (acute) exacerbation: Secondary | ICD-10-CM

## 2016-03-19 DIAGNOSIS — T405X1A Poisoning by cocaine, accidental (unintentional), initial encounter: Secondary | ICD-10-CM

## 2016-03-19 DIAGNOSIS — E1165 Type 2 diabetes mellitus with hyperglycemia: Secondary | ICD-10-CM | POA: Diagnosis present

## 2016-03-19 DIAGNOSIS — F319 Bipolar disorder, unspecified: Secondary | ICD-10-CM

## 2016-03-19 LAB — RAPID URINE DRUG SCREEN, HOSP PERFORMED
AMPHETAMINES: NOT DETECTED
BENZODIAZEPINES: POSITIVE — AB
Barbiturates: NOT DETECTED
Cocaine: NOT DETECTED
Opiates: POSITIVE — AB
TETRAHYDROCANNABINOL: NOT DETECTED

## 2016-03-19 LAB — URINE CULTURE

## 2016-03-19 LAB — GLUCOSE, CAPILLARY
GLUCOSE-CAPILLARY: 70 mg/dL (ref 65–99)
Glucose-Capillary: 178 mg/dL — ABNORMAL HIGH (ref 65–99)
Glucose-Capillary: 228 mg/dL — ABNORMAL HIGH (ref 65–99)
Glucose-Capillary: 230 mg/dL — ABNORMAL HIGH (ref 65–99)
Glucose-Capillary: 238 mg/dL — ABNORMAL HIGH (ref 65–99)
Glucose-Capillary: 331 mg/dL — ABNORMAL HIGH (ref 65–99)

## 2016-03-19 LAB — CBC
HCT: 31 % — ABNORMAL LOW (ref 36.0–46.0)
Hemoglobin: 8.8 g/dL — ABNORMAL LOW (ref 12.0–15.0)
MCH: 21.2 pg — ABNORMAL LOW (ref 26.0–34.0)
MCHC: 28.4 g/dL — AB (ref 30.0–36.0)
MCV: 74.5 fL — ABNORMAL LOW (ref 78.0–100.0)
PLATELETS: 341 10*3/uL (ref 150–400)
RBC: 4.16 MIL/uL (ref 3.87–5.11)
RDW: 18.3 % — AB (ref 11.5–15.5)
WBC: 15.3 10*3/uL — AB (ref 4.0–10.5)

## 2016-03-19 LAB — COMPREHENSIVE METABOLIC PANEL
ALBUMIN: 3.2 g/dL — AB (ref 3.5–5.0)
ALK PHOS: 74 U/L (ref 38–126)
ALT: 10 U/L — AB (ref 14–54)
AST: 12 U/L — AB (ref 15–41)
Anion gap: 9 (ref 5–15)
BILIRUBIN TOTAL: 1 mg/dL (ref 0.3–1.2)
BUN: 19 mg/dL (ref 6–20)
CO2: 34 mmol/L — ABNORMAL HIGH (ref 22–32)
CREATININE: 0.78 mg/dL (ref 0.44–1.00)
Calcium: 9.1 mg/dL (ref 8.9–10.3)
Chloride: 101 mmol/L (ref 101–111)
GFR calc Af Amer: >60 mL/min (ref >60)
GLUCOSE: 150 mg/dL — AB (ref 65–99)
Potassium: 3.6 mmol/L (ref 3.5–5.1)
Sodium: 144 mmol/L (ref 135–145)
TOTAL PROTEIN: 5.7 g/dL — AB (ref 6.5–8.1)

## 2016-03-19 MED ORDER — LORAZEPAM 2 MG/ML IJ SOLN
0.5000 mg | Freq: Two times a day (BID) | INTRAMUSCULAR | Status: DC | PRN
  Administered 2016-03-19 – 2016-03-20 (×3): 0.5 mg via INTRAVENOUS
  Filled 2016-03-19 (×3): qty 1

## 2016-03-19 MED ORDER — METHYLPREDNISOLONE SODIUM SUCC 125 MG IJ SOLR
60.0000 mg | Freq: Four times a day (QID) | INTRAMUSCULAR | Status: DC
  Administered 2016-03-19 – 2016-03-25 (×24): 60 mg via INTRAVENOUS
  Filled 2016-03-19 (×24): qty 2

## 2016-03-19 MED ORDER — DM-GUAIFENESIN ER 30-600 MG PO TB12
1.0000 | ORAL_TABLET | Freq: Two times a day (BID) | ORAL | Status: DC
  Administered 2016-03-19 – 2016-03-27 (×15): 1 via ORAL
  Filled 2016-03-19 (×17): qty 1

## 2016-03-19 MED ORDER — IPRATROPIUM-ALBUTEROL 0.5-2.5 (3) MG/3ML IN SOLN
3.0000 mL | Freq: Four times a day (QID) | RESPIRATORY_TRACT | Status: DC
  Administered 2016-03-19 – 2016-03-21 (×7): 3 mL via RESPIRATORY_TRACT
  Filled 2016-03-19 (×7): qty 3

## 2016-03-19 MED ORDER — LEVOTHYROXINE SODIUM 100 MCG IV SOLR
62.5000 ug | Freq: Every day | INTRAVENOUS | Status: DC
  Administered 2016-03-19 – 2016-03-20 (×2): 62.5 ug via INTRAVENOUS
  Filled 2016-03-19: qty 5

## 2016-03-19 MED ORDER — METHYLPREDNISOLONE SODIUM SUCC 125 MG IJ SOLR: 60.0000 mg | Freq: Two times a day (BID) | INTRAMUSCULAR | Status: DC

## 2016-03-19 MED ORDER — INSULIN GLARGINE 100 UNIT/ML ~~LOC~~ SOLN
10.0000 [IU] | Freq: Every day | SUBCUTANEOUS | Status: DC
  Administered 2016-03-19 – 2016-03-20 (×2): 10 [IU] via SUBCUTANEOUS
  Filled 2016-03-19 (×3): qty 0.1

## 2016-03-19 MED ORDER — DEXTROSE 5 % IV SOLN
1.0000 g | Freq: Three times a day (TID) | INTRAVENOUS | Status: DC
  Administered 2016-03-19 – 2016-03-23 (×13): 1 g via INTRAVENOUS
  Filled 2016-03-19 (×15): qty 1

## 2016-03-19 MED ORDER — VANCOMYCIN HCL 10 G IV SOLR
1250.0000 mg | Freq: Two times a day (BID) | INTRAVENOUS | Status: DC
  Administered 2016-03-19 – 2016-03-20 (×3): 1250 mg via INTRAVENOUS
  Filled 2016-03-19 (×3): qty 1250

## 2016-03-19 MED ORDER — IPRATROPIUM-ALBUTEROL 0.5-2.5 (3) MG/3ML IN SOLN
3.0000 mL | Freq: Four times a day (QID) | RESPIRATORY_TRACT | Status: DC
  Administered 2016-03-19 (×3): 3 mL via RESPIRATORY_TRACT
  Filled 2016-03-19 (×3): qty 3

## 2016-03-19 NOTE — Progress Notes (Signed)
CRITICAL VALUE ALERT  Critical value received: ABG pCo2 60.2   Date of notification: 03/19/16  Time of notification:  2058  Critical value read back:Yes.    Nurse who received alert:  Conrad BurlingtonH Trixy Loyola RN    MD notified (1st page):  Craige CottaKirby NP  Time of first page:  2110  MD notified (2nd page):  Time of second page:  Responding MD: Craige CottaKirby NP  Time MD responded:  2120

## 2016-03-19 NOTE — Progress Notes (Signed)
PROGRESS NOTE    [name]  BJY:[id number] DOB: 1961-01-05 DOA: 2016-03-27 PCP: [name], MD   Brief Narrative:  55 y.o. female 55 y.o. WF PMHx Generalized Anxiety disorder, Bipolar 1 Disorder, Major depressive disorder, recurrent, Seizure ,Panic disorder with agoraphobia, HTN,Chronic Diastolic CHF, MI ,COPD on 3 L home O2 (noncompliant), Rheumatic fever, DM type II uncontrolled with, complications, Hypothyroidism, Polysubstance abuse (crack cocaine)  Presents to the ED for evaluation of dyspnea and cough. Patient is chronically ill and has had increasingly frequent hospitalizations for respiratory problems, last from 6/17-6/21 for COPD exacerbation. On route her sats were down to the 60's later improving with respiratory treatments and Solumedrol. Patient denies orthopnea or lower extremity edema at this time. Her dyspnea is at rest. She reports cough. She denies chest pain with cough without palpitations. Denies hemoptysis. Denies any abdominal pain. Has decreased appetite but denies nausea or vomiting Denies dizziness or vertigo. Denies lower extremity swelling. No confusion was reported.Denies any sick contacts.   Assessment & Plan:   Active Problems:   Diabetes mellitus, insulin dependent (IDDM), uncontrolled (HCC)   Depression   HTN (hypertension)   COPD (chronic obstructive pulmonary disease) (HCC)   Generalized anxiety disorder   Physical deconditioning   Hypothyroidism   GERD without esophagitis   Bipolar I disorder (HCC)   Chronic diastolic heart failure (HCC)   Anemia of chronic disease   Chronic respiratory failure (HCC)   Acute on chronic respiratory failure (HCC)   COPD exacerbation (HCC)   Hypotension due to drugs   HCAP (healthcare-associated pneumonia)   ARF (acute respiratory failure) (HCC)   Rheumatic mitral stenosis   Sepsis (HCC)   Sepsis, unspecified organism (HCC)   Acute on chronic respiratory failure with hypoxia (HCC)   Lung problems from  crack cocaine (HCC)   Chronic diastolic CHF (congestive heart failure) (HCC)   Substance abuse   Uncontrolled type 2 diabetes mellitus with complication (HCC)   Acute on chronic respiratory failure with hypoxia -Patient with significant emphysema, and groundglass opacifications throughout all lung fields, 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 -Counseled patient that she stands a significant chance of not recovering, and if she did recover would continue to be O2 dependent, and most likely would not recover her previous voice.  -Consult to palliative care placed; short-term vs long-term care plans. Palliative care vs hospice. Patient with significant home issues grandchildren just removed by DSS and being care for by husband. -Titrate O2 to maintain SPO2 89-93%, to include use of BiPAP -Solu-Medrol 60 mg QID -DuoNeb QID -Flutter valve -Mucinex DM BID  Crack Lung  -Leukocytosis, most likely secondary to Crack Lung, but may also have superinfection (HCAP), though less likely.  -likely reactive, Crack Lung? CT from 6/10 consistent with early Crack Lung   Chronic diastolic heart failure,  -TTE (12/26/15) with EF 60-65%, severe mitral stenosis, and grade 2 diastolic dysfunction 82 kg, at baseline.  -Strict in and out -Daily weight Filed Weights   03-27-2016 1458  Weight: 86.1 kg (189 lb 13.1 oz)  -Hold Lasix until BP improves -Transfuse for hemoglobin<8  Hypertension  -Metoprolol IV 2.5 mg QID -Patient currently hypotensive hold all other BP medication  -Normal saline 31ml/hr  Substance abuse -UDS pending -Drug screen serum pending  Type II Diabetes uncontrolled with complication -4/20 hemoglobin A1c= 7.5  -Lantus 10 units daily -Moderate SSI -Nothing by mouth secondary to not being able to stay off of BiPAP  for at least 2 hours  Bipolar/ Anxiety -Ativan 0.5 mg  BID PRN  -Abilify 5 mg daily  -Depakote IV 250 mg  BID    Hypothyroid -Synthroid 125 g daily (for now 62.5 g IV)  Hyperlipidemia Continue home statins  GERD  Continue PPI  Anemia of chronic disease  Hemoglobin 10.2 on admission, at baseline  No acute issues at this time  -Transfuse 1 unit packed red blood cells if Hb less than 8 or acutely bleeding     DVT prophylaxis: Lovenox Code Status: Full Family Communication: None Disposition Plan: Will await palliative care recommendations. At a minimum patient needs to be made DNR   Consultants:    Procedures/Significant Events:  6/26 PCXR; Worsening diffuse bilateral airspace disease   Cultures 6/26 blood left/right AC NGTD 6/26 urine positive multiple species 6/26 MRSA by PCR negative   Antimicrobials: Cefepime 6/26>> Vancomycin 6/26>>   Devices    LINES / TUBES:      Continuous Infusions: . sodium chloride 50 mL/hr at 03/19/16 2000     Subjective: 6/27 A/O 4, very tearful, stating nobody cares about her, and she can't understand why God has done these things to her.     Objective: Filed Vitals:   03/19/16 1700 03/19/16 1900 03/19/16 1931 03/19/16 2000  BP: 83/50 124/58  111/54  Pulse: 90 91    Temp:   97.9 F (36.6 C)   TempSrc:   Oral   Resp: 19 16    Height:      Weight:      SpO2: 98% 100%      Intake/Output Summary (Last 24 hours) at 03/19/16 2039 Last data filed at 03/19/16 2000  Gross per 24 hour  Intake 1954.99 ml  Output   2825 ml  Net -870.01 ml   Filed Weights   August 15, 2016 1458  Weight: 86.1 kg (189 lb 13.1 oz)    Examination:  General: A/O 4, positive acute on chronic respiratory distress, raspy hoarse voice Eyes: negative scleral hemorrhage, negative anisocoria, negative icterus ENT: Negative Runny nose, negative gingival bleeding, Neck:  Negative scars, masses, torticollis, lymphadenopathy, JVD Lungs: poor air movement throughout all lung fields, positive expiratory wheezing partially secondary to tracheal stenosis  (bronchial wheezing), negative  Cardiovascular: Tachycardic, Regular rhythm without murmur gallop or rub normal S1 and S2 Abdomen: Morbidly obese, negative abdominal pain, nondistended, positive soft, bowel sounds, no rebound, no ascites, no appreciable mass Extremities: No significant cyanosis, clubbing, or edema bilateral lower extremities Skin: Negative rashes, lesions, ulcers Psychiatric:  Negative depression,Positive anxiety, negative fatigue, negative mania  Central nervous system:  Cranial nerves II through XII intact, tongue/uvula midline, all extremities muscle strength 5/5, sensation intact throughout,  negative dysarthria, negative expressive aphasia, negative receptive aphasia.  .     Data Reviewed: Care during the described time interval was provided by me .  I have reviewed this patient's available data, including medical history, events of note, physical examination, and all test results as part of my evaluation. I have personally reviewed and interpreted all radiology studies.  CBC:  Recent Labs Lab August 15, 2016 0945 03/19/16 0424  WBC 15.1* 15.3*  NEUTROABS 14.0*  --   HGB 10.2* 8.8*  HCT 34.5* 31.0*  MCV 75.8* 74.5*  PLT 373 341   Basic Metabolic Panel:  Recent Labs Lab August 15, 2016 0945 August 15, 2016 1753 03/19/16 0424  NA 138 140 144  K 4.2 4.2 3.6  CL 95* 103 101  CO2 30 30 34*  GLUCOSE 478* 432* 150*  BUN 28* 23* 19  CREATININE 1.15* 0.87 0.78  CALCIUM 8.9 8.3* 9.1   GFR: Estimated Creatinine Clearance: 80 mL/min (by C-G formula based on Cr of 0.78). Liver Function Tests:  Recent Labs Lab 04/14/2016 0945 03/23/2016 1753 03/19/16 0424  AST 18 12* 12*  ALT 12* 10* 10*  ALKPHOS 86 77 74  BILITOT 1.0 1.3* 1.0  PROT 6.6 5.9* 5.7*  ALBUMIN 3.5 3.2* 3.2*   No results for input(s): LIPASE, AMYLASE in the last 168 hours. No results for input(s): AMMONIA in the last 168 hours. Coagulation Profile:  Recent Labs Lab 04/06/2016 1753  INR 0.99   Cardiac  Enzymes: No results for input(s): CKTOTAL, CKMB, CKMBINDEX, TROPONINI in the last 168 hours. BNP (last 3 results) No results for input(s): PROBNP in the last 8760 hours. HbA1C: No results for input(s): HGBA1C in the last 72 hours. CBG:  Recent Labs Lab 03/19/16 0305 03/19/16 0726 03/19/16 1203 03/19/16 1507 03/19/16 1951  GLUCAP 230* 70 178* 228* 238*   Lipid Profile: No results for input(s): CHOL, HDL, LDLCALC, TRIG, CHOLHDL, LDLDIRECT in the last 72 hours. Thyroid Function Tests: No results for input(s): TSH, T4TOTAL, FREET4, T3FREE, THYROIDAB in the last 72 hours. Anemia Panel: No results for input(s): VITAMINB12, FOLATE, FERRITIN, TIBC, IRON, RETICCTPCT in the last 72 hours. Urine analysis:    Component Value Date/Time   COLORURINE YELLOW 28-Mar-2016 1050   APPEARANCEUR CLOUDY* 04/19/2016 1050   LABSPEC 1.019 04/04/2016 1050   PHURINE 6.0 04/04/2016 1050   GLUCOSEU >1000* 04/13/2016 1050   HGBUR SMALL* Mar 28, 2016 1050   BILIRUBINUR NEGATIVE 28-Mar-2016 1050   KETONESUR NEGATIVE 28-Mar-2016 1050   PROTEINUR NEGATIVE 04/06/2016 1050   UROBILINOGEN 0.2 07/29/2015 1948   NITRITE NEGATIVE 28-Mar-2016 1050   LEUKOCYTESUR TRACE* 03/26/2016 1050   Sepsis Labs: @LABRCNTIP (procalcitonin:4,lacticidven:4)  ) Recent Results (from the past 240 hour(s))  Culture, respiratory (NON-Expectorated)     Status: None   Collection Time: 03/10/16  7:30 AM  Result Value Ref Range Status   Specimen Description SPUTUM  Final   Special Requests NONE  Final   Gram Stain   Final    NO WBC SEEN NO ORGANISMS SEEN GRAM STAIN REVIEWED-AGREE WITH RESULT V WILKINS    Culture   Final    Consistent with normal respiratory flora. Performed at [facility]    Report Status 03/12/2016 FINAL  Final  Culture, sputum-assessment     Status: None   Collection Time: 03/10/16  7:32 AM  Result Value Ref Range Status   Specimen Description SPUTUM  Final   Special Requests NONE  Final   Sputum  evaluation   Final    THIS SPECIMEN IS ACCEPTABLE. RESPIRATORY CULTURE REPORT TO FOLLOW.   Report Status 03/10/2016 FINAL  Final  Blood Culture (routine x 2)     Status: None (Preliminary result)   Collection Time: Mar 28, 2016  9:45 AM  Result Value Ref Range Status   Specimen Description BLOOD LEFT ANTECUBITAL  Final   Special Requests BOTTLES DRAWN AEROBIC AND ANAEROBIC 5CC  Final   Culture NO GROWTH 1 DAY  Final   Report Status PENDING  Incomplete  Blood Culture (routine x 2)     Status: None (Preliminary result)   Collection Time: 03/31/2016 10:14 AM  Result Value Ref Range Status   Specimen Description BLOOD RIGHT ANTECUBITAL  Final   Special Requests BOTTLES DRAWN AEROBIC AND ANAEROBIC 5CC  Final   Culture NO GROWTH 1 DAY  Final   Report Status  PENDING  Incomplete  Urine culture     Status: Abnormal   Collection Time: 11/03/2015 10:50 AM  Result Value Ref Range Status   Specimen Description URINE, RANDOM  Final   Special Requests NONE  Final   Culture MULTIPLE SPECIES PRESENT, SUGGEST RECOLLECTION (A)  Final   Report Status 03/19/2016 FINAL  Final  MRSA PCR Screening     Status: None   Collection Time: 11/03/2015  8:20 PM  Result Value Ref Range Status   MRSA by PCR NEGATIVE NEGATIVE Final    Comment:        The GeneXpert MRSA Assay (FDA approved for NASAL specimens only), is one component of a comprehensive MRSA colonization surveillance program. It is not intended to diagnose MRSA infection nor to guide or monitor treatment for MRSA infections.          Radiology Studies: Dg Chest Port 1 View  03-19-16  CLINICAL DATA:  Shortness of breath, possible sepsis EXAM: PORTABLE CHEST 1 VIEW COMPARISON:  03/09/2016 FINDINGS: Diffuse airspace disease throughout the lungs, worsening since prior study. There is cardiomegaly. No visible significant effusions. No acute bony abnormality. Hardware within the humeri bilaterally. IMPRESSION: Worsening diffuse bilateral airspace disease  could reflect edema or infection. Electronically Signed   By: Charlett NoseKevin  Dover M.D.   On: 006-27-17 10:08        Scheduled Meds: . ARIPiprazole  5 mg Oral Daily  . aspirin EC  81 mg Oral Daily  . ceFEPime (MAXIPIME) IV  1 g Intravenous Q8H  . dextromethorphan-guaiFENesin  1 tablet Oral BID  . enoxaparin (LOVENOX) injection  40 mg Subcutaneous Q24H  . insulin aspart  0-15 Units Subcutaneous Q4H  . insulin glargine  10 Units Subcutaneous Daily  . ipratropium-albuterol  3 mL Nebulization Q6H  . levothyroxine  62.5 mcg Intravenous Daily  . methylPREDNISolone (SOLU-MEDROL) injection  60 mg Intravenous Q6H  . metoprolol  2.5 mg Intravenous Q6H  . pantoprazole  40 mg Oral Daily  . potassium chloride SA  20 mEq Oral Daily  . simvastatin  10 mg Oral QPM  . sodium chloride flush  3 mL Intravenous Q12H  . valproate sodium  250 mg Intravenous Q12H  . vancomycin  1,250 mg Intravenous Q12H   Continuous Infusions: . sodium chloride 50 mL/hr at 03/19/16 2000     LOS: 1 day    Time spent: 40 minutes    WOODS, Roselind MessierURTIS J, MD [facility] Pager [phone]   If 7PM-7AM, please contact night-coverage [email or web address] Password [email or web address] 03/19/2016, 8:39 PM

## 2016-03-19 NOTE — Progress Notes (Signed)
Pt taken off BiPap.  Significant wheezing heard.  WOB ok at this time.  Protocol assessment indicates bronchodilator, Duoneb ordered.  After ~ 10 min, pt placed back on BiPAP.  Will continue to monitor

## 2016-03-19 NOTE — Progress Notes (Signed)
Pharmacy Antibiotic Note  [name] is a 55 y.o. female admitted on 21-May-2016 with pneumonia.  Pharmacy has been consulted for vanc/cefepime dosing.  Patient's renal function is improving.  Plan: - Change vanc to 1250mg  IV Q12H - Change cefepime to 1gm IV Q8H - Monitor renal fxn, clinical progress, vanc trough at [facility] - F/U CBGs and continuing Lantus  Height: 5\' 2"  (157.5 cm) Weight: 189 lb 13.1 oz (86.1 kg) IBW/kg (Calculated) : 50.1  Temp (24hrs), Avg:98 F (36.7 C), Min:97.5 F (36.4 C), Max:98.5 F (36.9 C)   Recent Labs Lab July 01, 2016 0945 July 01, 2016 0955 July 01, 2016 1315 July 01, 2016 1752 July 01, 2016 1753 July 01, 2016 2207 03/19/16 0424  WBC 15.1*  --   --   --   --   --  15.3*  CREATININE 1.15*  --   --   --  0.87  --  0.78  LATICACIDVEN  --  3.52* 3.69* 1.9  --  2.7*  --     Estimated Creatinine Clearance: 80 mL/min (by C-G formula based on Cr of 0.78).    Allergies  Allergen Reactions  . Trazodone And Nefazodone Anaphylaxis and Other (See Comments)    Shortness of breath.  . Celecoxib Nausea Only  . Ibuprofen Nausea Only    Antimicrobials this admission: Vanc 6/26 >> Cefepime 6/26 >>  Dose adjustments this admission: N/A  Microbiology results: 6/26 Strep pneumo Ag - negative 6/26 MRSA PCR - negative 6/26 UCx - negative 6/26 BCx x2 -    Mikele Sifuentes D. Laney Potashang, PharmD, BCPS Pager:  [phone] 03/19/2016, 10:53 AM

## 2016-03-19 NOTE — Progress Notes (Signed)
CRITICAL VALUE ALERT  Critical value received: Lactic Acid 2.7  Date of notification:  07-May-2016  Time of notification:  2219  Critical value read back:Yes.    Nurse who received alert:  H. Milferd Ansell RN   MD notified (1st page):  Craige CottaKirby NP  Time of first page:  2235  MD notified (2nd page):  Time of second page:  Responding MD:  Craige CottaKirby NP  Time MD responded: 2330

## 2016-03-19 NOTE — Progress Notes (Signed)
Pt CBG 404. Craige CottaKirby Np Notified and gave orders.

## 2016-03-19 NOTE — Progress Notes (Signed)
[facility]  Patient Status: Active (receiving services up to time of hospitalization)  [facility] is providing the following services: RN and MSW  If patient discharges after hours, please call [phone].   Kizzie FurnishDonna Fellmy 03/19/2016, 4:21 PM

## 2016-03-19 NOTE — Progress Notes (Signed)
Pt repeatedly states fears of dying.  RN asked patient if she would like for a member of clergy to come speak with her.  She stated yes.  Consult ordered.

## 2016-03-19 NOTE — Progress Notes (Signed)
Inpatient Diabetes Program Recommendations  AACE/ADA: New Consensus Statement on Inpatient Glycemic Control (2015)  Target Ranges:  Prepandial:   less than 140 mg/dL      Peak postprandial:   less than 180 mg/dL (1-2 hours)      Critically ill patients:  140 - 180 mg/dL   Results for Evaristo BuryMCBRIDE, Jamiria J (MRN [id number]) as of 03/19/2016 07:24  Ref. Range 2016/03/12 16:33 2016/03/12 21:17 03/19/2016 00:47 03/19/2016 03:05  Glucose-Capillary Latest Ref Range: 65-99 mg/dL 132332 (H) 440404 (H) 102331 (H) 230 (H)    Admit with: Sepsis/ Pneumonia  History: DM, COPD, CHF  Home DM Meds: Lantus 50 units QHS       Novolog 6-15 units tid       Amaryl 4 mg daily  Current Insulin Orders: Novolog Moderate Correction Scale/ SSI (0-15 units) Q4 hours     -Note patient currently receiving IV Solumedrol 60 mg Q6 hours.  -Patient does take both Lantus and Novolog at home.  Currently NPO.    MD- Please consider the following in-hospital insulin adjustments:  Start Lantus 25 units QHS (50% home dose)     --Will follow patient during hospitalization--  Ambrose FinlandJeannine Johnston Jenee Spaugh RN, MSN, CDE Diabetes Coordinator Inpatient Glycemic Control Team Team Pager: [phone] (8a-5p)

## 2016-03-19 NOTE — Progress Notes (Signed)
Pt taken off BiPAP and placed on 4L [place].  Pt currently stable with no distress.  Will continue to monitor.

## 2016-03-20 DIAGNOSIS — Z7189 Other specified counseling: Secondary | ICD-10-CM | POA: Diagnosis present

## 2016-03-20 DIAGNOSIS — R0600 Dyspnea, unspecified: Secondary | ICD-10-CM

## 2016-03-20 DIAGNOSIS — Z515 Encounter for palliative care: Secondary | ICD-10-CM | POA: Diagnosis present

## 2016-03-20 LAB — BLOOD GAS, ARTERIAL
ACID-BASE EXCESS: 10.8 mmol/L — AB (ref 0.0–2.0)
Bicarbonate: 36.6 mEq/L — ABNORMAL HIGH (ref 20.0–24.0)
DRAWN BY: 398981
O2 Content: 3.5 L/min
O2 Saturation: 95.8 %
PCO2 ART: 68.2 mmHg — AB (ref 35.0–45.0)
PH ART: 7.35 (ref 7.350–7.450)
Patient temperature: 98.6
TCO2: 38.7 mmol/L (ref 0–100)
pO2, Arterial: 87.2 mmHg (ref 80.0–100.0)

## 2016-03-20 LAB — GLUCOSE, CAPILLARY
GLUCOSE-CAPILLARY: 223 mg/dL — AB (ref 65–99)
GLUCOSE-CAPILLARY: 166 mg/dL — AB (ref 65–99)
GLUCOSE-CAPILLARY: 233 mg/dL — AB (ref 65–99)
Glucose-Capillary: 316 mg/dL — ABNORMAL HIGH (ref 65–99)
Glucose-Capillary: 289 mg/dL — ABNORMAL HIGH (ref 65–99)
Glucose-Capillary: 40 mg/dL — CL (ref 65–99)
Glucose-Capillary: 42 mg/dL — CL (ref 65–99)

## 2016-03-20 LAB — CBC WITH DIFFERENTIAL/PLATELET
Basophils Absolute: 0 10*3/uL (ref 0.0–0.1)
Basophils Relative: 0 %
Eosinophils Absolute: 0 10*3/uL (ref 0.0–0.7)
Eosinophils Relative: 0 %
HEMATOCRIT: 32.7 % — AB (ref 36.0–46.0)
HEMOGLOBIN: 9.6 g/dL — AB (ref 12.0–15.0)
LYMPHS ABS: 0.6 10*3/uL — AB (ref 0.7–4.0)
LYMPHS PCT: 3 %
MCH: 22.2 pg — AB (ref 26.0–34.0)
MCHC: 29.4 g/dL — AB (ref 30.0–36.0)
MCV: 75.5 fL — ABNORMAL LOW (ref 78.0–100.0)
MONOS PCT: 2 %
Monocytes Absolute: 0.3 10*3/uL (ref 0.1–1.0)
NEUTROS ABS: 18.2 10*3/uL — AB (ref 1.7–7.7)
NEUTROS PCT: 95 %
Platelets: 323 10*3/uL (ref 150–400)
RBC: 4.33 MIL/uL (ref 3.87–5.11)
RDW: 18.4 % — ABNORMAL HIGH (ref 11.5–15.5)
WBC: 19.1 10*3/uL — ABNORMAL HIGH (ref 4.0–10.5)

## 2016-03-20 LAB — BASIC METABOLIC PANEL
ANION GAP: 11 (ref 5–15)
BUN: 26 mg/dL — ABNORMAL HIGH (ref 6–20)
CHLORIDE: 98 mmol/L — AB (ref 101–111)
CO2: 32 mmol/L (ref 22–32)
Calcium: 9.1 mg/dL (ref 8.9–10.3)
Creatinine, Ser: 0.76 mg/dL (ref 0.44–1.00)
GFR calc Af Amer: >60 mL/min (ref >60)
GFR calc non Af Amer: >60 mL/min (ref >60)
GLUCOSE: 207 mg/dL — AB (ref 65–99)
POTASSIUM: 3.9 mmol/L (ref 3.5–5.1)
Sodium: 141 mmol/L (ref 135–145)

## 2016-03-20 LAB — MAGNESIUM: Magnesium: 2.3 mg/dL (ref 1.7–2.4)

## 2016-03-20 MED ORDER — LEVOTHYROXINE SODIUM 25 MCG PO TABS
125.0000 ug | ORAL_TABLET | Freq: Every day | ORAL | Status: DC
  Administered 2016-03-21: 125 ug via ORAL
  Filled 2016-03-20: qty 1

## 2016-03-20 MED ORDER — INSULIN ASPART 100 UNIT/ML ~~LOC~~ SOLN
0.0000 [IU] | SUBCUTANEOUS | Status: DC
  Administered 2016-03-20: 15 [IU] via SUBCUTANEOUS
  Administered 2016-03-20: 11 [IU] via SUBCUTANEOUS
  Administered 2016-03-21: 20 [IU] via SUBCUTANEOUS
  Administered 2016-03-21: 7 [IU] via SUBCUTANEOUS
  Administered 2016-03-21: 4 [IU] via SUBCUTANEOUS
  Administered 2016-03-21: 7 [IU] via SUBCUTANEOUS
  Administered 2016-03-21: 4 [IU] via SUBCUTANEOUS
  Administered 2016-03-22: 20 [IU] via SUBCUTANEOUS
  Administered 2016-03-22 (×2): 7 [IU] via SUBCUTANEOUS
  Administered 2016-03-22 (×3): 4 [IU] via SUBCUTANEOUS
  Administered 2016-03-23: 3 [IU] via SUBCUTANEOUS
  Administered 2016-03-23 (×2): 7 [IU] via SUBCUTANEOUS
  Administered 2016-03-23: 20 [IU] via SUBCUTANEOUS
  Administered 2016-03-23 – 2016-03-24 (×2): 7 [IU] via SUBCUTANEOUS
  Administered 2016-03-24: 15 [IU] via SUBCUTANEOUS
  Administered 2016-03-25: 4 [IU] via SUBCUTANEOUS
  Administered 2016-03-25: 7 [IU] via SUBCUTANEOUS
  Administered 2016-03-25: 4 [IU] via SUBCUTANEOUS
  Administered 2016-03-25: 3 [IU] via SUBCUTANEOUS

## 2016-03-20 MED ORDER — FUROSEMIDE 10 MG/ML IJ SOLN
80.0000 mg | Freq: Two times a day (BID) | INTRAMUSCULAR | Status: DC
  Administered 2016-03-20 – 2016-03-25 (×11): 80 mg via INTRAVENOUS
  Filled 2016-03-20 (×11): qty 8

## 2016-03-20 MED ORDER — LORAZEPAM 2 MG/ML IJ SOLN
1.0000 mg | INTRAMUSCULAR | Status: DC | PRN
  Administered 2016-03-20 – 2016-03-21 (×5): 1 mg via INTRAVENOUS
  Filled 2016-03-20 (×5): qty 1

## 2016-03-20 MED ORDER — INSULIN GLARGINE 100 UNIT/ML ~~LOC~~ SOLN
20.0000 [IU] | Freq: Every day | SUBCUTANEOUS | Status: DC
Start: 2016-03-21 — End: 2016-03-23
  Administered 2016-03-21 – 2016-03-23 (×3): 20 [IU] via SUBCUTANEOUS
  Filled 2016-03-20 (×3): qty 0.2

## 2016-03-20 NOTE — Progress Notes (Signed)
Inpatient Diabetes Program Recommendations  AACE/ADA: New Consensus Statement on Inpatient Glycemic Control (2015)  Target Ranges:  Prepandial:   less than 140 mg/dL      Peak postprandial:   less than 180 mg/dL (1-2 hours)      Critically ill patients:  140 - 180 mg/dL   Results for Evaristo BuryMCBRIDE, Eriel J (MRN [id number]) as of 03/20/2016 12:20  Ref. Range 03/19/2016 00:47 03/19/2016 03:05 03/19/2016 07:26 03/19/2016 12:03 03/19/2016 15:07 03/19/2016 19:51  Glucose-Capillary Latest Ref Range: 65-99 mg/dL 469331 (H) 629230 (H) 70 528178 (H) 228 (H) 238 (H)   Results for Evaristo BuryMCBRIDE, Dakayla J (MRN [id number]) as of 03/20/2016 12:20  Ref. Range 03/19/2016 23:17 03/20/2016 03:21 03/20/2016 08:01 03/20/2016 11:26  Glucose-Capillary Latest Ref Range: 65-99 mg/dL 272223 (H) 536166 (H) 644233 (H) 316 (H)    Admit with: Sepsis/ Pneumonia  History: DM, COPD, CHF  Home DM Meds: Lantus 50 units QHS  Novolog 6-15 units tid  Amaryl 4 mg daily  Current Insulin Orders: Novolog Moderate Correction Scale/ SSI (0-15 units) Q4 hours      Lantus 10 units daily     -Note patient currently receiving IV Solumedrol 60 mg Q6 hours.  -Glucose levels consistently elevated.    MD- Please consider increasing Lantus to 25 units daily (50% home dose)     --Will follow patient during hospitalization--  Ambrose FinlandJeannine Johnston Ramone Gander RN, MSN, CDE Diabetes Coordinator Inpatient Glycemic Control Team Team Pager: [phone] (8a-5p)

## 2016-03-20 NOTE — Progress Notes (Signed)
PT Cancellation Note  Patient Details Name: [name] MRN: [id number] DOB: Sep 05, 1961   Cancelled Treatment:    Reason Eval/Treat Not Completed: Medical issues which prohibited therapy.  RN recommended holding due to respiratory issues.  PT to check back tomorrow.  Thanks,    Rollene Rotundaebecca B. Jakob Kimberlin, PT, DPT [phone]#[id number]   03/20/2016, 6:29 PM

## 2016-03-20 NOTE — Consult Note (Signed)
Consultation Note Date: 03/20/2016   Patient Name: [name]  DOB: 1960-12-13  MRN: [id number]  Age / Sex: 55 y.o., female  PCP: Nolene Ebbs, MD Referring Physician: Cherene Altes, MD  Reason for Consultation: Establishing goals of care  HPI/Patient Profile: 55 y.o. female  with past medical history of HTN, anemia, depression, bipolar disorder, MI, COPD with O2 dependency, CHF, GERD, hypothryoidism and crack/cocaine drug abuse  admitted on 03/02/2016 with acute on chronic respiratory failure r/t HCAP (recently discharged from hospital on 6/21 d/t CHF/COPO exacerbation). She has had 8 hospital admissions in the last 6 months. 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  Clinical Assessment and Goals of Care: Met with patient in her bed. She appeared uncomfortable and tearful. States "They told me I'm going to die no matter what". Patient notes that husband, Casper Harrison would be primary decision maker should she become unable to make healthcare decisions. She does not want CPR or intubation. She would like to see her family including husband Jeneen Rinks, two daughters, Judson Roch and Di Kindle, and her grandchildren. Given her advanced lung disease, recent decline in respiratory status, multiple hospitalizations and multiple comorbidities, I believe DNR status is an appropriate choice. She also stated she just wants to go to sleep. I assured her that we can offer her interventions to increase her comfort as she moves towards end of life. I also let her know I would call her family and let them know her wishes regarding her care and that she would like to see them.    NEXT OF KIN - husband- Casper Harrison- I attempted to call Jeneen Rinks and Judson Roch, however received unidentified voicemail. I will continue to try and contact them.    SUMMARY OF RECOMMENDATIONS    Code  Status/Advance Care Planning:  DNR    Symptom Management:   Shortness of breath- Morphine '1mg'$  q4hours prn SOB or pain  Palliative Prophylaxis:   Delirium Protocol  Additional Recommendations (Limitations, Scope, Preferences):  No Artificial Feeding and No Tracheostomy  Psycho-social/Spiritual:   Desire for further Chaplaincy support:will offer at next meeting  Additional Recommendations: Will explore Hospice at home vs. Residential hospice as we see how she progresses over next few days  Prognosis:   < 6 months d/t recent hospitalizations and decline in respiratory status  Discharge Planning: To Be Determined Home with hospice vs. Residential hospice depending on progression      Primary Diagnoses: Present on Admission:  . Sepsis ([place]) . Depression . HTN (hypertension) . Diabetes mellitus, insulin dependent (IDDM), uncontrolled ([place]) . COPD (chronic obstructive pulmonary disease) ([place]) . Generalized anxiety disorder . Physical deconditioning . Hypothyroidism . GERD without esophagitis . Chronic respiratory failure ([place]) . Bipolar I disorder ([place]) . Chronic diastolic heart failure ([place]) . Anemia of chronic disease . Acute on chronic respiratory failure ([place]) . COPD exacerbation ([place]) . Hypotension due to drugs . HCAP (healthcare-associated pneumonia) . Rheumatic mitral stenosis . (Resolved) Chest pain on exertion . ARF (acute respiratory  failure) ([place]) . Sepsis, unspecified organism ([place]) . Acute on chronic respiratory failure with hypoxia ([place]) . Lung problems from crack cocaine ([place]) . Chronic diastolic CHF (congestive heart failure) ([place]) . Substance abuse . Uncontrolled type 2 diabetes mellitus with complication ([place])  I have reviewed the medical record, interviewed the patient and family, and examined the patient. The following aspects are pertinent.  Past Medical History  Diagnosis Date  . Hypertension   . Anemia   . Chronic back pain     "all  over"  . Neuropathy ([place])   . Hypercholesterolemia   . Major depressive disorder, recurrent ([place])   . Generalized anxiety disorder   . Panic disorder with agoraphobia   . Tobacco abuse   . MI (myocardial infarction) ([place]) 2007    a. Per patient report she had a heart attack in 2007. Our consult note from 11/2006 indicates the patient had been seen in 07/2006 by her PCP and was told based on an EKG that she may have had a prior MI. She had undergone a low risk stress test at that time.  . Type II diabetes mellitus ([place])   . Heart murmur   . Emphysema of lung ([place])   . Pneumonia 1960's X 2  . On home oxygen therapy     "2L prn" (04/12/2015)  . Thin blood ([facility])   . History of blood transfusion     "related to OR"  . GERD (gastroesophageal reflux disease)   . Arthritis     "hands, left knee, left shoulder, back feet" (04/12/2015)  . Bipolar disorder ([place])   . Rheumatic fever   . Mitral stenosis   . COPD (chronic obstructive pulmonary disease) ([place])   . Hypothyroidism   . Chronic back pain   . PONV (postoperative nausea and vomiting)   . Seizures ([place])     at age 33  . CHF (congestive heart failure) ([place])   . Fracture of right humerus 04/13/2015  . Left patella fracture 04/12/2015  . Shoulder fracture, right 04/12/2015  . Shortness of breath dyspnea    Social History   Social History  . Marital Status: Divorced    Spouse Name: N/A  . Number of Children: 2  . Years of Education: N/A   Occupational History  . Disabled    Social History Main Topics  . Smoking status: Former Smoker -- 1.00 packs/day for 20 years    Types: Cigarettes    Quit date: 07/05/2012  . Smokeless tobacco: Never Used  . Alcohol Use: Yes     Comment: 04/12/2015 "quit drinking in 2011"  . Drug Use: Yes    Special: "Crack" cocaine     Comment: 04/11/2014 "no drugs since 2005"  . Sexual Activity: Not Currently   Other Topics Concern  . None   Social History Narrative   Family History  Problem Relation  Age of Onset  . Heart disease Mother   . Lung cancer Father     was a former smoker  . Heart attack Mother   . Stroke Mother   . Hypertension Mother    Scheduled Meds: . ARIPiprazole  5 mg Oral Daily  . aspirin EC  81 mg Oral Daily  . ceFEPime (MAXIPIME) IV  1 g Intravenous Q8H  . dextromethorphan-guaiFENesin  1 tablet Oral BID  . enoxaparin (LOVENOX) injection  40 mg Subcutaneous Q24H  . insulin aspart  0-15 Units Subcutaneous Q4H  . insulin glargine  10 Units Subcutaneous Daily  .  ipratropium-albuterol  3 mL Nebulization Q6H  . levothyroxine  62.5 mcg Intravenous Daily  . methylPREDNISolone (SOLU-MEDROL) injection  60 mg Intravenous Q6H  . metoprolol  2.5 mg Intravenous Q6H  . pantoprazole  40 mg Oral Daily  . potassium chloride SA  20 mEq Oral Daily  . simvastatin  10 mg Oral QPM  . sodium chloride flush  3 mL Intravenous Q12H  . valproate sodium  250 mg Intravenous Q12H  . vancomycin  1,250 mg Intravenous Q12H   Continuous Infusions: . sodium chloride 75 mL/hr at 03/20/16 0700   PRN Meds:.bisacodyl, HYDROcodone-acetaminophen, LORazepam, magnesium citrate, morphine injection, ondansetron **OR** ondansetron (ZOFRAN) IV, senna-docusate, traZODone Medications Prior to Admission:  Prior to Admission medications   Medication Sig Start Date End Date Taking? Authorizing Provider  albuterol (PROAIR HFA) 108 (90 Base) MCG/ACT inhaler Inhale 2 puffs into the lungs every 6 (six) hours as needed for wheezing or shortness of breath. scheduled 01/26/16  Yes Modena Jansky, MD  ALPRAZolam Duanne Moron) 0.5 MG tablet Take 0.5 tablets (0.25 mg total) by mouth 2 (two) times daily as needed for anxiety. 01/26/16  Yes Modena Jansky, MD  ARIPiprazole (ABILIFY) 5 MG tablet Take 5 mg by mouth daily.   Yes Historical Provider, MD  aspirin EC 81 MG tablet Take 1 tablet (81 mg total) by mouth daily. 01/26/16  Yes Modena Jansky, MD  cetirizine (ZYRTEC) 10 MG tablet Take 10 mg by mouth daily.    Yes  Historical Provider, MD  divalproex (DEPAKOTE) 250 MG DR tablet Take 1 tablet (250 mg total) by mouth every 12 (twelve) hours. 01/09/16  Yes Theodis Blaze, MD  furosemide (LASIX) 40 MG tablet Take 1 tablet (40 mg total) by mouth 2 (two) times daily. 01/09/16  Yes Theodis Blaze, MD  glimepiride (AMARYL) 4 MG tablet Take 4 mg by mouth daily with breakfast.   Yes Historical Provider, MD  HYDROcodone-acetaminophen (NORCO/VICODIN) 5-325 MG tablet Take 1 tablet by mouth every 6 (six) hours as needed for moderate pain.   Yes Historical Provider, MD  insulin aspart (NOVOLOG FLEXPEN) 100 UNIT/ML FlexPen Inject 6-15 Units into the skin 3 (three) times daily as needed for high blood sugar (CBG >100). CBG 100-200 6-8 units, >200 15 units   Yes Historical Provider, MD  insulin glargine (LANTUS) 100 UNIT/ML injection Inject 0.45 mLs (45 Units total) into the skin at bedtime. 01/26/16  Yes Modena Jansky, MD  levothyroxine (SYNTHROID, LEVOTHROID) 125 MCG tablet Take 1 tablet (125 mcg total) by mouth daily before breakfast. 10/17/15  Yes Thurnell Lose, MD  losartan (COZAAR) 50 MG tablet Take 1 tablet (50 mg total) by mouth daily. 03/07/16  Yes Theodis Blaze, MD  metoprolol tartrate (LOPRESSOR) 25 MG tablet Take 0.5 tablets (12.5 mg total) by mouth 2 (two) times daily. 04/18/15  Yes Orson Eva, MD  omeprazole (PRILOSEC) 20 MG capsule Take 20 mg by mouth daily.   Yes Historical Provider, MD  OXYGEN Inhale into the lungs continuous. 3L   Yes Historical Provider, MD  potassium chloride SA (K-DUR,KLOR-CON) 20 MEQ tablet Take 1 tablet (20 mEq total) by mouth daily. 02/16/16  Yes Burtis Junes, NP  predniSONE (DELTASONE) 10 MG tablet Take '40mg'$  (4 tabs) x 3 days, then taper to '30mg'$  (3 tabs) x 3 days, then '20mg'$  (2 tabs) x 3days, then '10mg'$  (1 tab) x 3days, then OFF. 03/13/16  Yes Maryann Mikhail, DO  simvastatin (ZOCOR) 10 MG tablet Take 10 mg by mouth  daily.    Yes Historical Provider, MD  triamcinolone cream (KENALOG) 0.1 %  Apply 1 application topically 4 (four) times daily.   Yes Historical Provider, MD  Amino Acids-Protein Hydrolys (FEEDING SUPPLEMENT, PRO-STAT SUGAR FREE 64,) LIQD Take 30 mLs by mouth 2 (two) times daily. Patient not taking: Reported on 03/19/2016 03/13/16   Velta Addison Mikhail, DO  ipratropium-albuterol (DUONEB) 0.5-2.5 (3) MG/3ML SOLN Take 3 mLs by nebulization every 2 (two) hours as needed. Patient taking differently: Take 3 mLs by nebulization every 2 (two) hours as needed. For shortness of breath 01/09/16   Theodis Blaze, MD  levofloxacin (LEVAQUIN) 750 MG tablet Take 1 tablet (750 mg total) by mouth daily. Patient not taking: Reported on 03/19/2016 03/13/16   Cristal Ford, DO   Allergies  Allergen Reactions  . Trazodone And Nefazodone Anaphylaxis and Other (See Comments)    Shortness of breath.  . Celecoxib Nausea Only  . Ibuprofen Nausea Only   Review of Systems  Respiratory: Positive for shortness of breath.        Unable to lay flat    Physical Exam  Constitutional: She appears well-developed. She appears distressed.  HENT:  Head: Normocephalic and atraumatic.  Eyes: EOM are normal.  Cardiovascular: Normal rate and regular rhythm.   Pulmonary/Chest: She has wheezes.  Increased effort, rate, sounds diminished, on bipap  Musculoskeletal:  Generalized weakness  Neurological: [name] alert.  Skin: [name] diaphoretic.  Psychiatric:  Labile, tearful    Vital Signs: BP 139/78 mmHg  Pulse 96  Temp(Src) 98.1 F (36.7 C) (Oral)  Resp 17  Ht '5\' 2"'$  (1.575 m)  Wt 85.6 kg (188 lb 11.4 oz)  BMI 34.51 kg/m2  SpO2 86% Pain Assessment: 0-10   Pain Score: 0-No pain   SpO2: SpO2: (!) 86 % O2 Device:SpO2: (!) 86 % O2 Flow Rate: .O2 Flow Rate (L/min): 4 L/min  IO: Intake/output summary:  Intake/Output Summary (Last 24 hours) at 03/20/16 0904 Last data filed at 03/20/16 0800  Gross per 24 hour  Intake 2630.83 ml  Output   2225 ml  Net 405.83 ml    LBM: Last BM Date:  03/10/16 Baseline Weight: Weight: 86.1 kg (189 lb 13.1 oz) Most recent weight: Weight: 85.6 kg (188 lb 11.4 oz)     Palliative Assessment/Data: PPS- 30%     Time In: 0830 Time Out: 0930 Time Total: 60 min Greater than 50%  of this time was spent counseling and coordinating care related to the above assessment and plan.  Thank your for this consult. Will continue to follow and offer support and assistance to patient and family.  Signed by: Mariana Kaufman, AGNP-C   Please contact Palliative Medicine Team phone at [phone] for questions and concerns.  For individual provider: See Shea Evans

## 2016-03-20 NOTE — Progress Notes (Signed)
PROGRESS NOTE  Evaristo BuryMary J Niday  ONG:[id number] DOB: November 17, 1960 DOA: 2016-08-23 PCP: Dorrene GermanEdwin A Avbuere, MD   Brief Narrative:  55 y.o. female w/ Hx Generalized Anxiety disorder and Panic d/o, Bipolar 1 Disorder, Major depressive disorder, Seizure, HTN, Chronic Diastolic CHF, MI ,COPD on 3 L home O2 (noncompliant), Rheumatic fever, DM2 uncontrolled, Hypothyroidism, and Polysubstance abuse (crack cocaine) who presented to the ED w/ dyspnea and cough. Patient is chronically ill and has had increasingly frequent hospitalizations for respiratory problems, last from 6/17-6/21 for COPD exacerbation. Her sats were initially in the 60's.   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 on chest CT 6/10 (probable scarring from prior intubation) -continues to smoke crack cocaine -status today has been further complicated by severe anxiety, and indecision on code status   Crack Lung - Substance abuse -UDS negative for cocaine this admit   Chronic diastolic heart failure  -TTE 12/26/15 with EF 60-65%, severe mitral stenosis, and grade 2 diastolic dysfunction -82 kg at baseline -resume lasix today as BP stable   Filed Weights   06/12/2016 1458 03/19/16 2017 03/20/16 0500  Weight: 86.1 kg (189 lb 13.1 oz) 85.6 kg (188 lb 11.4 oz) 85.6 kg (188 lb 11.4 oz)    Hypertension  -has encountered hypotension this hospital stay - BP appears to be improving   Type II Diabetes uncontrolled with complication -4/20 A1c 7.5 - CBG poorly controlled - adjust tx and follow   Bipolar/ Anxiety  Hypothyroid -Synthroid daily  Hyperlipidemia Continue home tx  GERD  Continue PPI  Anemia of chronic disease  No acute issues at this time    DVT prophylaxis: Lovenox Code Status: FULL (Pt made it clear to me directly that SHE  DESIRES FULL CODE STATUS) Family Communication: None Disposition Plan: SDU    Consultants:  Palliative Care   Procedures/Significant Events:  none  Antimicrobials: Cefepime 6/26 > Vancomycin 6/26 >  Subjective: The patient has been very anxious today with tachypnea and hypoxia.  When her anxiety is treated her rest for status icy stabilizes/improves.  She denies chest pain nausea vomiting or abdominal pain but admits to significant dyspnea.  Despite earlier reports that she was a no CODE BLUE she has now decided that she wishes to be a full code.  She has told me directly that she understands what this means and that she wants to be put on a ventilator if necessary along with all of the other implications of being full code.  Objective: Filed Vitals:   03/20/16 0600 03/20/16 0800 03/20/16 0820 03/20/16 0939  BP: 125/61 139/78    Pulse: 76 88 96   Temp:  98.1 F (36.7 C)    TempSrc:  Oral    Resp: 13 16 17    Height:      Weight:      SpO2: 100% 100% 86% 100%    Intake/Output Summary (Last 24 hours) at 03/20/16 1137 Last data filed at 03/20/16 0800  Gross per 24 hour  Intake   2390 ml  Output   1600 ml  Net    790 ml   Filed Weights   06/12/2016 1458 03/19/16 2017 03/20/16 0500  Weight: 86.1 kg (189 lb 13.1 oz) 85.6 kg (188 lb 11.4 oz) 85.6 kg (188 lb 11.4 oz)    Examination: General: No acute respiratory distress at the  time of my visit post anxiolytic Lungs: Fine crackles scattered throughout all fields with poor air movement in general but no wheezing Cardiovascular: Regular rate and rhythm without murmur gallop or rub normal S1 and S2 Abdomen: Nontender, nondistended, soft, bowel sounds positive, no rebound, no ascites, no appreciable mass Extremities: No significant cyanosis, or clubbing;  trace edema bilateral lower extremities  CBC:  Recent Labs Lab Jul 26, 2016 0945 03/19/16 0424 03/20/16 0510  WBC 15.1* 15.3* 19.1*  NEUTROABS 14.0*  --  18.2*  HGB 10.2*  8.8* 9.6*  HCT 34.5* 31.0* 32.7*  MCV 75.8* 74.5* 75.5*  PLT 373 341 323   Basic Metabolic Panel:  Recent Labs Lab Jul 26, 2016 0945 Jul 26, 2016 1753 03/19/16 0424 03/20/16 0510  NA 138 140 144 141  K 4.2 4.2 3.6 3.9  CL 95* 103 101 98*  CO2 30 30 34* 32  GLUCOSE 478* 432* 150* 207*  BUN 28* 23* 19 26*  CREATININE 1.15* 0.87 0.78 0.76  CALCIUM 8.9 8.3* 9.1 9.1  MG  --   --   --  2.3   GFR: Estimated Creatinine Clearance: 79.7 mL/min (by C-G formula based on Cr of 0.76).   Liver Function Tests:  Recent Labs Lab Jul 26, 2016 0945 Jul 26, 2016 1753 03/19/16 0424  AST 18 12* 12*  ALT 12* 10* 10*  ALKPHOS 86 77 74  BILITOT 1.0 1.3* 1.0  PROT 6.6 5.9* 5.7*  ALBUMIN 3.5 3.2* 3.2*   Coagulation Profile:  Recent Labs Lab Jul 26, 2016 1753  INR 0.99   CBG:  Recent Labs Lab 03/19/16 1951 03/19/16 2317 03/20/16 0321 03/20/16 0801 03/20/16 1126  GLUCAP 238* 223* 166* 233* 316*     Urine analysis:    Component Value Date/Time   COLORURINE YELLOW 2016-03-27 1050   APPEARANCEUR CLOUDY* 2016-03-27 1050   LABSPEC 1.019 2016-03-27 1050   PHURINE 6.0 2016-03-27 1050   GLUCOSEU >1000* 2016-03-27 1050   HGBUR SMALL* 2016-03-27 1050   BILIRUBINUR NEGATIVE 2016-03-27 1050   KETONESUR NEGATIVE 2016-03-27 1050   PROTEINUR NEGATIVE 2016-03-27 1050   UROBILINOGEN 0.2 07/29/2015 1948   NITRITE NEGATIVE 2016-03-27 1050   LEUKOCYTESUR TRACE* 2016-03-27 1050    Recent Results (from the past 240 hour(s))  Blood Culture (routine x 2)     Status: None (Preliminary result)   Collection Time: Jul 26, 2016  9:45 AM  Result Value Ref Range Status   Specimen Description BLOOD LEFT ANTECUBITAL  Final   Special Requests BOTTLES DRAWN AEROBIC AND ANAEROBIC 5CC  Final   Culture NO GROWTH 1 DAY  Final   Report Status PENDING  Incomplete  Blood Culture (routine x 2)     Status: None (Preliminary result)   Collection Time: Jul 26, 2016 10:14 AM  Result Value Ref Range Status   Specimen Description  BLOOD RIGHT ANTECUBITAL  Final   Special Requests BOTTLES DRAWN AEROBIC AND ANAEROBIC 5CC  Final   Culture NO GROWTH 1 DAY  Final   Report Status PENDING  Incomplete  Urine culture     Status: Abnormal   Collection Time: Jul 26, 2016 10:50 AM  Result Value Ref Range Status   Specimen Description URINE, RANDOM  Final   Special Requests NONE  Final   Culture MULTIPLE SPECIES PRESENT, SUGGEST RECOLLECTION (A)  Final   Report Status 03/19/2016 FINAL  Final  MRSA PCR Screening     Status: None   Collection Time: Jul 26, 2016  8:20 PM  Result Value Ref Range Status   MRSA by PCR NEGATIVE NEGATIVE Final    Comment:  The GeneXpert MRSA Assay (FDA approved for NASAL specimens only), is one component of a comprehensive MRSA colonization surveillance program. It is not intended to diagnose MRSA infection nor to guide or monitor treatment for MRSA infections.      Radiology Studies: Dg Chest 2 View  03/19/2016  CLINICAL DATA:  54 year old female with sepsis, CHF, and COPD. EXAM: CHEST  2 VIEW COMPARISON:  Chest radiograph dated 03-26-2016 FINDINGS: There has been interval improvement of the aeration of the lungs with improvement of the airspace density and vascular prominence. Bibasilar airspace densities again noted which may represent atelectasis versus infiltrate. Stable cardiomegaly. There is no pneumothorax. No pleural effusion. There is osteopenia with degenerative changes of the spine. Left shoulder hemiarthroplasty and right humeral orthopedic fixation hardware noted. No acute fracture. IMPRESSION: Interval improvement of the aeration of the lungs. Electronically Signed   By: Elgie Collard M.D.   On: 03/19/2016 22:48    Scheduled Meds: . ARIPiprazole  5 mg Oral Daily  . aspirin EC  81 mg Oral Daily  . ceFEPime (MAXIPIME) IV  1 g Intravenous Q8H  . dextromethorphan-guaiFENesin  1 tablet Oral BID  . enoxaparin (LOVENOX) injection  40 mg Subcutaneous Q24H  . insulin aspart  0-15  Units Subcutaneous Q4H  . insulin glargine  10 Units Subcutaneous Daily  . ipratropium-albuterol  3 mL Nebulization Q6H  . levothyroxine  62.5 mcg Intravenous Daily  . methylPREDNISolone (SOLU-MEDROL) injection  60 mg Intravenous Q6H  . metoprolol  2.5 mg Intravenous Q6H  . pantoprazole  40 mg Oral Daily  . potassium chloride SA  20 mEq Oral Daily  . simvastatin  10 mg Oral QPM  . sodium chloride flush  3 mL Intravenous Q12H  . valproate sodium  250 mg Intravenous Q12H  . vancomycin  1,250 mg Intravenous Q12H     LOS: 2 days    Time spent: 35 minutes  Lonia Blood, MD [facility] Office  [phone] Pager - Text Page per Loretha Stapler as per below:  On-Call/Text Page:      [email or web address]      password [email or web address]  If 7PM-7AM, please contact night-coverage [email or web address] Password [facility] 03/20/2016, 11:47 AM

## 2016-03-20 NOTE — Progress Notes (Signed)
   03/20/16 1100  Clinical Encounter Type  Visited With Patient  Visit Type Patient actively dying  Referral From Nurse  Consult/Referral To Chaplain  Spiritual Encounters  Spiritual Needs Prayer;Emotional  Stress Factors  Patient Stress Factors Loss of control;Exhausted  [facility]  Patient is actively dying and is very tearful and doesn't want to die.  CHP prayed with patient and spent time talking with her about what she is feeling.  She wanted God to forgive her for doing drugs. CHP spoke with her, reassured her of God's love and forgiveness and talked with her about how God wants us to forgive ourselves as well. CHP will follow up with her. Rodney BoozeGail L Raziel Koenigs 03/20/2016

## 2016-03-20 NOTE — Progress Notes (Signed)
Neb tx terminated early. Pt states its to cold she cant take it. RN aware

## 2016-03-20 NOTE — Progress Notes (Signed)
Pt had blood sugar of 40, pt given coke to drink and blood sugar rechecked after 10 minutes. Blood sugar only increased to 42. Pt drank more coke and blood sugar rechecked and was 81. Pt is alert and oriented. Marisue Ivanobyn Kendric Sindelar RN

## 2016-03-20 NOTE — Progress Notes (Signed)
No charge note  I was able to contact patient's husband [name] and let him know of [name]'s status. [name] insisted that he would like patient to be full codes status. I explained to [name] that while patient is alert and decisional that she is the primary decision maker and she desires No Code status at this time. However, after speaking with [name], I received a call from [place], [name] for [name] stating that [name] has changed her mind and wants to resume full code status.   I have changed her code status back to full code for now. Plan to meet with patient, spouse and possible daughter tomorrow around 1030 am to readress code status and goals of care.

## 2016-03-21 DIAGNOSIS — J9622 Acute and chronic respiratory failure with hypercapnia: Secondary | ICD-10-CM

## 2016-03-21 DIAGNOSIS — Z7189 Other specified counseling: Secondary | ICD-10-CM | POA: Diagnosis present

## 2016-03-21 DIAGNOSIS — Z515 Encounter for palliative care: Secondary | ICD-10-CM | POA: Diagnosis present

## 2016-03-21 DIAGNOSIS — D638 Anemia in other chronic diseases classified elsewhere: Secondary | ICD-10-CM

## 2016-03-21 DIAGNOSIS — E038 Other specified hypothyroidism: Secondary | ICD-10-CM

## 2016-03-21 LAB — BASIC METABOLIC PANEL
Anion gap: 8 (ref 5–15)
BUN: 28 mg/dL — ABNORMAL HIGH (ref 6–20)
CHLORIDE: 93 mmol/L — AB (ref 101–111)
CO2: 38 mmol/L — AB (ref 22–32)
CREATININE: 0.81 mg/dL (ref 0.44–1.00)
Calcium: 8.9 mg/dL (ref 8.9–10.3)
GFR calc non Af Amer: >60 mL/min (ref >60)
Glucose, Bld: 253 mg/dL — ABNORMAL HIGH (ref 65–99)
Potassium: 3.4 mmol/L — ABNORMAL LOW (ref 3.5–5.1)
Sodium: 139 mmol/L (ref 135–145)

## 2016-03-21 LAB — GLUCOSE, CAPILLARY
GLUCOSE-CAPILLARY: 190 mg/dL — AB (ref 65–99)
GLUCOSE-CAPILLARY: 192 mg/dL — AB (ref 65–99)
GLUCOSE-CAPILLARY: 229 mg/dL — AB (ref 65–99)
GLUCOSE-CAPILLARY: 44 mg/dL — AB (ref 65–99)
GLUCOSE-CAPILLARY: 81 mg/dL (ref 65–99)
GLUCOSE-CAPILLARY: 91 mg/dL (ref 65–99)
Glucose-Capillary: 386 mg/dL — ABNORMAL HIGH (ref 65–99)
Glucose-Capillary: 209 mg/dL — ABNORMAL HIGH (ref 65–99)
Glucose-Capillary: 100 mg/dL — ABNORMAL HIGH (ref 65–99)

## 2016-03-21 LAB — CBC
HEMATOCRIT: 31.9 % — AB (ref 36.0–46.0)
HEMOGLOBIN: 9.6 g/dL — AB (ref 12.0–15.0)
MCH: 22.7 pg — ABNORMAL LOW (ref 26.0–34.0)
MCHC: 30.1 g/dL (ref 30.0–36.0)
MCV: 75.6 fL — AB (ref 78.0–100.0)
PLATELETS: 256 10*3/uL (ref 150–400)
RBC: 4.22 MIL/uL (ref 3.87–5.11)
RDW: 18.2 % — AB (ref 11.5–15.5)
WBC: 14.8 10*3/uL — ABNORMAL HIGH (ref 4.0–10.5)

## 2016-03-21 LAB — URINE CULTURE: Culture: NO GROWTH

## 2016-03-21 MED ORDER — IPRATROPIUM-ALBUTEROL 0.5-2.5 (3) MG/3ML IN SOLN
3.0000 mL | Freq: Four times a day (QID) | RESPIRATORY_TRACT | Status: DC
  Administered 2016-03-22 – 2016-03-28 (×26): 3 mL via RESPIRATORY_TRACT
  Filled 2016-03-21 (×27): qty 3

## 2016-03-21 MED ORDER — LEVOTHYROXINE SODIUM 100 MCG IV SOLR
62.5000 ug | Freq: Every day | INTRAVENOUS | Status: DC
  Administered 2016-03-21 – 2016-03-24 (×4): 62.5 ug via INTRAVENOUS
  Filled 2016-03-21 (×4): qty 5

## 2016-03-21 MED ORDER — MORPHINE SULFATE (PF) 2 MG/ML IV SOLN
2.0000 mg | INTRAVENOUS | Status: DC | PRN
  Administered 2016-03-21 – 2016-03-26 (×23): 2 mg via INTRAVENOUS
  Filled 2016-03-21 (×23): qty 1

## 2016-03-21 MED ORDER — LORAZEPAM 2 MG/ML IJ SOLN
INTRAMUSCULAR | Status: AC
  Filled 2016-03-21: qty 1

## 2016-03-21 MED ORDER — VANCOMYCIN HCL IN DEXTROSE 1-5 GM/200ML-% IV SOLN
1000.0000 mg | Freq: Two times a day (BID) | INTRAVENOUS | Status: DC
  Administered 2016-03-21 – 2016-03-23 (×4): 1000 mg via INTRAVENOUS
  Filled 2016-03-21 (×8): qty 200

## 2016-03-21 MED ORDER — LORAZEPAM 2 MG/ML IJ SOLN
1.0000 mg | INTRAMUSCULAR | Status: DC | PRN
  Administered 2016-03-21 – 2016-03-28 (×10): 1 mg via INTRAVENOUS
  Filled 2016-03-21 (×10): qty 1

## 2016-03-21 MED ORDER — IPRATROPIUM-ALBUTEROL 0.5-2.5 (3) MG/3ML IN SOLN: 3.0000 mL | Freq: Four times a day (QID) | RESPIRATORY_TRACT | Status: DC | PRN

## 2016-03-21 MED ORDER — LORAZEPAM 2 MG/ML IJ SOLN
1.0000 mg | INTRAMUSCULAR | Status: DC
  Administered 2016-03-21: 1 mg via INTRAVENOUS

## 2016-03-21 NOTE — Progress Notes (Signed)
PT Cancellation Note/Discharge:  Patient Details Name: [name] MRN: [id number] DOB: 10-Aug-1961   Cancelled Treatment:    Reason Eval/Treat Not Completed: Other (comment):  Pt severely dyspnic at rest.  Palliative care involved with end of life and comfort support.  PT would likely cause increased anxiety and increased difficulty breathing.  PT to sign off at this time.  Please re consult if appropriate.  Thanks,    Rollene Rotundaebecca B. Hollie Bartus, PT, DPT [phone]   03/21/2016, 6:14 PM

## 2016-03-21 NOTE — Clinical Social Work Note (Signed)
Clinical Social Work Assessment  Patient Details  Name: SHEMICKA COHRS MRN: [id number] Date of Birth: 1960-10-18  Date of referral:  03/21/16               Reason for consult:  End of Life/Hospice, Other (Comment Required) (Financial resources)                Permission sought to share information with:  Family Supports Permission granted to share information::  Yes, Verbal Permission Granted  Name::     Diplomatic Services operational officer::     Relationship::  Significant Other for 24 years  Contact Information:  [phone]  Housing/Transportation Living arrangements for the past 2 months:  [place] Information:  Other (Comment Required) (Significant other of 24 years) Patient Interpreter Needed:  None Criminal Activity/Legal Involvement Pertinent to Current Situation/Hospitalization:  No - Comment as needed Significant Relationships:  Adult Children, Significant Other, Other(Comment) (Grandchildren) Lives with:  Significant Other Do you feel safe going back to the place where you live?  Yes Need for family participation in patient care:  Yes (Comment)  Care giving concerns:  Patient's significant other in need of financial resources for patient's burial when she passes.   Social Worker assessment / plan:  CSW met with patient. Significant other at bedside. Patient not awake and only oriented to person and place. CSW introduced role and inquired about financial needs. Patient's significant other stated that he needed assistance with HCPOA/will and financial needs for funeral/burial when patient passes. CSW informed patient that chaplain usually assists with HCPOA. CSW left voicemail with spiritual care. CSW told patient that financial assistance will be looked into. No further concerns. CSW encouraged patient's significant other to contact CSW as needed. CSW will continue to follow patient and assist with any social work related needs.  Employment status:  Unemployed Radiation protection practitioner:  Medicaid In [place] PT Recommendations:  Not assessed at this time Information / Referral to community resources:  Other (Comment Required) (Will locate financial resources for funeral and burial)  Patient/Family's Response to care:  Patient not fully oriented. Patient's significant other inquiring about assistance with financial needs for funeral and burial when she passes, HCPOA, and will. Patient's significant other supportive and involved in patient's care. Patient's significant other appreciated social work intervention.  Patient/Family's Understanding of and Emotional Response to Diagnosis, Current Treatment, and Prognosis:  Patient's significant other understands need for advanced directives and that patient may pass within the next month, per palliative care.  Emotional Assessment Appearance:  Appears stated age Attitude/Demeanor/Rapport:  Unable to Assess Affect (typically observed):  Unable to Assess Orientation:  Oriented to Self, Oriented to Place Alcohol / Substance use:  Tobacco Use, Illicit Drugs Psych involvement (Current and /or in the community):  No (Comment)  Discharge Needs  Concerns to be addressed:  Financial / Insurance Concerns Readmission within the last 30 days:  Yes Current discharge risk:  Inadequate Financial Supports, Terminally ill Barriers to Discharge:  No Barriers Identified   Candie Chroman, LCSW 03/21/2016, 1:13 PM

## 2016-03-21 NOTE — Progress Notes (Signed)
Inpatient Diabetes Program Recommendations  AACE/ADA: New Consensus Statement on Inpatient Glycemic Control (2015)  Target Ranges:  Prepandial:   less than 140 mg/dL      Peak postprandial:   less than 180 mg/dL (1-2 hours)      Critically ill patients:  140 - 180 mg/dL  Results for [name], [name] (MRN [id number]) as of 03/21/2016 10:25  Ref. Range 03/20/2016 08:01 03/20/2016 11:26 03/20/2016 14:59 03/20/2016 20:33 03/20/2016 21:02 03/20/2016 21:42 03/21/2016 00:00 03/21/2016 03:50 03/21/2016 08:21  Glucose-Capillary Latest Ref Range: 65-99 mg/dL 119233 (H)  Novolog 5 units @ 10:48 316 (H)   Lantus 10 units @12 :39 289 (H)  Novolog 15 units @ 14:02  Novolog 11 units @ 17:30 40 (LL) 42 (LL) 81 91 229 (H)  Novolog 7 units @ 4:24 190 (H)  Novolog 4 units @ 8:25   Results for [name], [name] (MRN [id number]) as of 03/21/2016 10:25  Ref. Range 01/11/2016 18:43  Hemoglobin A1C Latest Ref Range: 4.8-5.6 % 7.5 (H)   Review of Glycemic Control  Diabetes history: DM2 Outpatient Diabetes medications: Lantus 50 units QHS, Novolog 6-15 units TID with meals, Amaryl 4 mg daily Current orders for Inpatient glycemic control: Lantus 20 units daily, Novolog 0-20 units Q4H  Inpatient Diabetes Program Recommendations: Insulin - Basal: Patient has received a total of 42 units for correction over the past 24 hours. Note glucose 40 mg/dl at 13:0820:33 on 6/57/846/28/17 which was likely from receiving Novolog 15 units at 14:02 and Novolog 11 units at 17:30 (received a total of 26 units of Novolog in a 3 1/2 hour period). Anticipate patient needs more basal insulin. Please consider increasing Lantus to 30 units (based on 83 kg x 0.35 units).  Diet: Continues to have poor PO intake per chart.  Thanks, Orlando PennerMarie Teliah Buffalo, RN, MSN, CDE Diabetes Coordinator Inpatient Diabetes Program [phone] (Team Pager from 8am to 5pm) [phone] ([facility] office) [phone] [facility] office) [phone] [facility] office)

## 2016-03-21 NOTE — Progress Notes (Signed)
Chaplain at bedside with significant other. Pt stating she cannot breath, nonrebreather applied. 02 100% on nonrebreather.  PRN ativan given. Will continue to monitor.

## 2016-03-21 NOTE — Progress Notes (Signed)
   03/21/16 1100  Clinical Encounter Type  Visited With Patient;Family;Patient and family together  Referral From Nurse;Palliative care team  Spiritual Encounters  Spiritual Needs Prayer;Emotional;Grief support;Other (Comment) (ADV DIR Discussion)  CH met with spouse/significant other and patient following [place] meeting with PCT; [place] discussed advanced directive and per their understanding not necessary at this time; [place] observed pt with very difficult breathing and noticed distress on spouse's face and concern about not being ready to die from pt; pt is scared but family understands the seriousness of the pt and that pt is actively dying;  [facility] will monitor and visit this PM. 12:02 PM Gwynn Burly

## 2016-03-21 NOTE — Progress Notes (Signed)
PROGRESS NOTE    Elizabeth BuryMary J [name]  UJW:[id number] DOB: [birth year] DOA: 03/11/2016 PCP: Dorrene GermanEdwin A Avbuere, MD   Brief Narrative:  55 y.o. female 55 y.o. WF PMHx Generalized Anxiety disorder, Bipolar 1 Disorder, Major depressive disorder, recurrent, Seizure ,Panic disorder with agoraphobia, HTN,Chronic Diastolic CHF, MI ,COPD on 3 L home O2 (noncompliant), Rheumatic fever, DM type II uncontrolled with, complications, Hypothyroidism, Polysubstance abuse (crack cocaine)  Presents to the ED for evaluation of dyspnea and cough. Patient is chronically ill and has had increasingly frequent hospitalizations for respiratory problems, last from 6/17-6/21 for COPD exacerbation. On route her sats were down to the 60's later improving with respiratory treatments and Solumedrol. Patient denies orthopnea or lower extremity edema at this time. Her dyspnea is at rest. She reports cough. She denies chest pain with cough without palpitations. Denies hemoptysis. Denies any abdominal pain. Has decreased appetite but denies nausea or vomiting Denies dizziness or vertigo. Denies lower extremity swelling. No confusion was reported.Denies any sick contacts.   Assessment & Plan:   Active Problems:   Diabetes mellitus, insulin dependent (IDDM), uncontrolled (HCC)   Depression   HTN (hypertension)   COPD (chronic obstructive pulmonary disease) (HCC)   Generalized anxiety disorder   Physical deconditioning   Hypothyroidism   GERD without esophagitis   Bipolar I disorder (HCC)   Chronic diastolic heart failure (HCC)   Anemia of chronic disease   Chronic respiratory failure (HCC)   Acute on chronic respiratory failure (HCC)   COPD exacerbation (HCC)   Hypotension due to drugs   HCAP (healthcare-associated pneumonia)   ARF (acute respiratory failure) (HCC)   Rheumatic mitral stenosis   Sepsis (HCC)   Sepsis, unspecified organism (HCC)   Acute on chronic respiratory failure with hypoxia (HCC)   Lung problems from  crack cocaine (HCC)   Chronic diastolic CHF (congestive heart failure) (HCC)   Substance abuse   Uncontrolled type 2 diabetes mellitus with complication (HCC)   Acute dyspnea   Encounter for palliative care   Goals of care, counseling/discussion   Advance care planning   Palliative care encounter   Acute on chronic respiratory failure with hypoxia -Patient with significant emphysema, and groundglass opacifications throughout all lung fields, 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 -Counseled patient that she stands a significant chance of not recovering, and if she did recover would continue to be O2 dependent, and most likely would not recover her previous voice.  -Consult to palliative care placed; short-term vs long-term care plans. Palliative care vs hospice. Patient with significant home issues grandchildren just removed by DSS and being care for by husband. -Titrate O2 to maintain SPO2 89-93%, to include use of BiPAP -Solu-Medrol 60 mg QID -DuoNeb QID -Flutter valve -Mucinex DM BID  Crack Lung/HCAP  -Leukocytosis, most likely secondary to Crack Lung, but may also have superinfection (HCAP), though less likely.  -likely reactive, Crack Lung? CT from 6/10 consistent with early Crack Lung   Chronic diastolic heart failure,  -TTE (12/26/15) with EF 60-65%, severe mitral stenosis, and grade 2 diastolic dysfunction 82 kg, at baseline.  -Strict in and out since admission -5.5 L -Daily weight Filed Weights   03/19/16 2017 03/20/16 0500 03/21/16 0348  Weight: 85.6 kg (188 lb 11.4 oz) 85.6 kg (188 lb 11.4 oz) 83.371 kg (183 lb 12.8 oz)  -Hold Lasix until BP improves -Transfuse for hemoglobin<8  Hypertension/MV moderate to severe stenosis  -Lasix 80 mg BID -Patient currently hypotensive  hold all other BP medication   Substance abuse -UDS pending -Drug screen serum pending  Type II Diabetes uncontrolled with complication -4/20  hemoglobin A1c= 7.5  -Lantus 20 units daily -Resistant SSI -Nothing by mouth secondary to not being able to stay off of BiPAP for at least 2 hours  Bipolar/ Anxiety -Ativan PRN  -Morphine PRN for comfort -Abilify 5 mg daily  -Depakote IV 250 mg  BID   Hypothyroid -Synthroid 125 g daily (for now 62.5 g IV)  Hyperlipidemia Continue home statins  GERD  Continue PPI  Anemia of chronic disease  Hemoglobin 10.2 on admission, at baseline  No acute issues at this time  -Transfuse 1 unit packed red blood cells if Hb less than 8 or acutely bleeding     DVT prophylaxis: Lovenox Code Status: DO NOT RESUSCITATE Family Communication: None Disposition Plan: Will await palliative care recommendations. At a minimum patient needs to be made DNR   Consultants:  NP Barbara Cower Palliative Care    Procedures/Significant Events:  6/26 PCXR; Worsening diffuse bilateral airspace disease   Cultures 6/26 blood left/right AC NGTD 6/26 urine positive multiple species 6/26 MRSA by PCR negative   Antimicrobials: Cefepime 6/26>> Vancomycin 6/26>>   Devices    LINES / TUBES:      Continuous Infusions: . sodium chloride 10 mL/hr at 03/20/16 2300     Subjective: 6/29 A/O 4, states comfortable     Objective: Filed Vitals:   03/21/16 0921 03/21/16 1100 03/21/16 1200 03/21/16 1445  BP:   111/62 121/58  Pulse:   86 79  Temp:  98.4 F (36.9 C)  98.4 F (36.9 C)  TempSrc:  Axillary  Axillary  Resp:   18 16  Height:      Weight:      SpO2: 100%  100% 99%    Intake/Output Summary (Last 24 hours) at 03/21/16 1916 Last data filed at 03/21/16 1500  Gross per 24 hour  Intake 1427.34 ml  Output   4000 ml  Net -2572.66 ml   Filed Weights   03/19/16 2017 03/20/16 0500 03/21/16 0348  Weight: 85.6 kg (188 lb 11.4 oz) 85.6 kg (188 lb 11.4 oz) 83.371 kg (183 lb 12.8 oz)    Examination:  General: A/O 4, positive chronic respiratory distress, raspy hoarse  voice Eyes: negative scleral hemorrhage, negative anisocoria, negative icterus ENT: Negative Runny nose, negative gingival bleeding, Neck:  Negative scars, masses, torticollis, lymphadenopathy, JVD Lungs: poor air movement throughout all lung fields, positive expiratory wheezing partially secondary to tracheal stenosis (bronchial wheezing), negative  Cardiovascular: Regular rhythm and rate without murmur gallop or rub normal S1 and S2 Abdomen: Morbidly obese, negative abdominal pain, nondistended, positive soft, bowel sounds, no rebound, no ascites, no appreciable mass Extremities: No significant cyanosis, clubbing, or edema bilateral lower extremities Skin: Negative rashes, lesions, ulcers Psychiatric:  Negative depression,Positive anxiety, negative fatigue, negative mania  Central nervous system:  Cranial nerves II through XII intact, tongue/uvula midline, all extremities muscle strength 5/5, sensation intact throughout,  negative dysarthria, negative expressive aphasia, negative receptive aphasia.  .     Data Reviewed: Care during the described time interval was provided by me .  I have reviewed this patient's available data, including medical history, events of note, physical examination, and all test results as part of my evaluation. I have personally reviewed and interpreted all radiology studies.  CBC:  Recent Labs Lab 03-26-2016 0945 03/19/16 0424 03/20/16 0510 03/21/16 0548  WBC 15.1* 15.3* 19.1* 14.8*  NEUTROABS 14.0*  --  18.2*  --   HGB 10.2* 8.8* 9.6* 9.6*  HCT 34.5* 31.0* 32.7* 31.9*  MCV 75.8* 74.5* 75.5* 75.6*  PLT 373 341 323 256   Basic Metabolic Panel:  Recent Labs Lab 03/04/2016 0945 03/22/2016 1753 03/19/16 0424 03/20/16 0510 03/21/16 0548  NA 138 140 144 141 139  K 4.2 4.2 3.6 3.9 3.4*  CL 95* 103 101 98* 93*  CO2 30 30 34* 32 38*  GLUCOSE 478* 432* 150* 207* 253*  BUN 28* 23* 19 26* 28*  CREATININE 1.15* 0.87 0.78 0.76 0.81  CALCIUM 8.9 8.3* 9.1 9.1  8.9  MG  --   --   --  2.3  --    GFR: Estimated Creatinine Clearance: 77.6 mL/min (by C-G formula based on Cr of 0.81). Liver Function Tests:  Recent Labs Lab 03/22/2016 0945 03/15/2016 1753 03/19/16 0424  AST 18 12* 12*  ALT 12* 10* 10*  ALKPHOS 86 77 74  BILITOT 1.0 1.3* 1.0  PROT 6.6 5.9* 5.7*  ALBUMIN 3.5 3.2* 3.2*   No results for input(s): LIPASE, AMYLASE in the last 168 hours. No results for input(s): AMMONIA in the last 168 hours. Coagulation Profile:  Recent Labs Lab 03/19/2016 1753  INR 0.99   Cardiac Enzymes: No results for input(s): CKTOTAL, CKMB, CKMBINDEX, TROPONINI in the last 168 hours. BNP (last 3 results) No results for input(s): PROBNP in the last 8760 hours. HbA1C: No results for input(s): HGBA1C in the last 72 hours. CBG:  Recent Labs Lab 03/21/16 03/21/16 0350 03/21/16 0821 03/21/16 1202 03/21/16 1505  GLUCAP 91 229* 190* 386* 209*   Lipid Profile: No results for input(s): CHOL, HDL, LDLCALC, TRIG, CHOLHDL, LDLDIRECT in the last 72 hours. Thyroid Function Tests: No results for input(s): TSH, T4TOTAL, FREET4, T3FREE, THYROIDAB in the last 72 hours. Anemia Panel: No results for input(s): VITAMINB12, FOLATE, FERRITIN, TIBC, IRON, RETICCTPCT in the last 72 hours. Urine analysis:    Component Value Date/Time   COLORURINE YELLOW 02/27/2016 1050   APPEARANCEUR CLOUDY* 03/21/2016 1050   LABSPEC 1.019 02/27/2016 1050   PHURINE 6.0 02/27/2016 1050   GLUCOSEU >1000* 02/29/2016 1050   HGBUR SMALL* 02/28/2016 1050   BILIRUBINUR NEGATIVE 03/04/2016 1050   KETONESUR NEGATIVE 03/19/2016 1050   PROTEINUR NEGATIVE 03/14/2016 1050   UROBILINOGEN 0.2 07/29/2015 1948   NITRITE NEGATIVE 03/05/2016 1050   LEUKOCYTESUR TRACE* 03/03/2016 1050   Sepsis Labs: @LABRCNTIP (procalcitonin:4,lacticidven:4)  ) Recent Results (from the past 240 hour(s))  Blood Culture (routine x 2)     Status: None (Preliminary result)   Collection Time: 03/10/2016  9:45 AM   Result Value Ref Range Status   Specimen Description BLOOD LEFT ANTECUBITAL  Final   Special Requests BOTTLES DRAWN AEROBIC AND ANAEROBIC 5CC  Final   Culture NO GROWTH 3 DAYS  Final   Report Status PENDING  Incomplete  Blood Culture (routine x 2)     Status: None (Preliminary result)   Collection Time: 03/03/2016 10:14 AM  Result Value Ref Range Status   Specimen Description BLOOD RIGHT ANTECUBITAL  Final   Special Requests BOTTLES DRAWN AEROBIC AND ANAEROBIC 5CC  Final   Culture NO GROWTH 3 DAYS  Final   Report Status PENDING  Incomplete  Urine culture     Status: Abnormal   Collection Time: 03/17/2016 10:50 AM  Result Value Ref Range Status   Specimen Description URINE, RANDOM  Final   Special Requests NONE  Final   Culture MULTIPLE SPECIES PRESENT,  SUGGEST RECOLLECTION (A)  Final   Report Status 03/19/2016 FINAL  Final  MRSA PCR Screening     Status: None   Collection Time: 03/22/2016  8:20 PM  Result Value Ref Range Status   MRSA by PCR NEGATIVE NEGATIVE Final    Comment:        The GeneXpert MRSA Assay (FDA approved for NASAL specimens only), is one component of a comprehensive MRSA colonization surveillance program. It is not intended to diagnose MRSA infection nor to guide or monitor treatment for MRSA infections.   Culture, Urine     Status: None   Collection Time: 03/19/16  8:52 PM  Result Value Ref Range Status   Specimen Description URINE, CATHETERIZED  Final   Special Requests NONE  Final   Culture NO GROWTH  Final   Report Status 03/21/2016 FINAL  Final         Radiology Studies: Dg Chest 2 View  03/19/2016  CLINICAL DATA:  55 year old female with sepsis, CHF, and COPD. EXAM: CHEST  2 VIEW COMPARISON:  Chest radiograph dated 03/04/2016 FINDINGS: There has been interval improvement of the aeration of the lungs with improvement of the airspace density and vascular prominence. Bibasilar airspace densities again noted which may represent atelectasis versus  infiltrate. Stable cardiomegaly. There is no pneumothorax. No pleural effusion. There is osteopenia with degenerative changes of the spine. Left shoulder hemiarthroplasty and right humeral orthopedic fixation hardware noted. No acute fracture. IMPRESSION: Interval improvement of the aeration of the lungs. Electronically Signed   By: Elgie Collard M.D.   On: 03/19/2016 22:48        Scheduled Meds: . ARIPiprazole  5 mg Oral Daily  . aspirin EC  81 mg Oral Daily  . ceFEPime (MAXIPIME) IV  1 g Intravenous Q8H  . dextromethorphan-guaiFENesin  1 tablet Oral BID  . enoxaparin (LOVENOX) injection  40 mg Subcutaneous Q24H  . furosemide  80 mg Intravenous BID  . insulin aspart  0-20 Units Subcutaneous Q4H  . insulin glargine  20 Units Subcutaneous Daily  . ipratropium-albuterol  3 mL Nebulization Q6H  . levothyroxine  62.5 mcg Intravenous Daily  . methylPREDNISolone (SOLU-MEDROL) injection  60 mg Intravenous Q6H  . pantoprazole  40 mg Oral Daily  . potassium chloride SA  20 mEq Oral Daily  . simvastatin  10 mg Oral QPM  . sodium chloride flush  3 mL Intravenous Q12H  . valproate sodium  250 mg Intravenous Q12H  . vancomycin  1,000 mg Intravenous Q12H   Continuous Infusions: . sodium chloride 10 mL/hr at 03/20/16 2300     LOS: 3 days    Time spent: 40 minutes    WOODS, Roselind Messier, MD [facility] Pager [phone]   If 7PM-7AM, please contact night-coverage [email or web address] Password [facility] 03/21/2016, 7:16 PM

## 2016-03-21 NOTE — Progress Notes (Addendum)
Daily Progress Note   Patient Name: [name]       Date: 03/21/2016 DOB: [birth year]  Age: 55 y.o. MRN#: [id number] Attending Physician: Allie Bossier, MD Primary Care Physician: Philis Fendt, MD Admit Date: 02/28/2016  Reason for Consultation/Follow-up: Establishing goals of care and Non pain symptom management  Subjective:  HPI: 55 y.o. female with past medical history of HTN, anemia, depression, bipolar disorder, MI, COPD with O2 dependency, CHF, GERD, hypothryoidism and crack/cocaine drug abuse admitted on 03/20/2016 with acute on chronic respiratory failure r/t HCAP (recently discharged from hospital on 6/21 d/t CHF/COPO exacerbation). She has had 8 hospital admissions in the last 6 months. 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 The patient's code status has been ambivalent as at one point patient states desire for No Code, and then will change her mind.   Interval history: Met with significant other of 20+years, [name], and patient today to discuss goals of care. Presented both with information regarding her lung disease, progression, and the fact that if she were to code again, she would likely not survive and if she did survive, she would likely have decreased functional status, pain, and need long term ventilation. Also discussed with patient that her lung disease is not going to get better. Explored meaning of quality of time with friends and family, versus quantity of time when considering goals of therapy. Patient and spouse both agreed they would like to pursue quality and make sure that patient is comfortable during what time of life she has left. Patient and spouse asked for prognosis of time- we discussed that death is  unpredictable, however, based on disease trajectories and past presentations we can estimate a range of weeks to months. After this discussion we met privately with [name] who expressed distress regarding caring for his grandchildren, and being able to afford burial. Recommended that social work may be able to provide information regarding financial assistance for burial. [name] also requests HCPOA and living will paperwork. [name] is appropriate person to be HCPOA as although there are 2 adult children, one is estranged and the other is deemed incompetent.  Length of Stay: 3  Current Medications: Scheduled Meds:  . ARIPiprazole  5 mg Oral Daily  . aspirin EC  81 mg Oral Daily  .  ceFEPime (MAXIPIME) IV  1 g Intravenous Q8H  . dextromethorphan-guaiFENesin  1 tablet Oral BID  . enoxaparin (LOVENOX) injection  40 mg Subcutaneous Q24H  . furosemide  80 mg Intravenous BID  . insulin aspart  0-20 Units Subcutaneous Q4H  . insulin glargine  20 Units Subcutaneous Daily  . ipratropium-albuterol  3 mL Nebulization Q6H  . levothyroxine  125 mcg Oral QAC breakfast  . LORazepam  1 mg Intravenous Q2H  . methylPREDNISolone (SOLU-MEDROL) injection  60 mg Intravenous Q6H  . pantoprazole  40 mg Oral Daily  . potassium chloride SA  20 mEq Oral Daily  . simvastatin  10 mg Oral QPM  . sodium chloride flush  3 mL Intravenous Q12H  . valproate sodium  250 mg Intravenous Q12H    Continuous Infusions: . sodium chloride 10 mL/hr at 03/20/16 2300    PRN Meds: bisacodyl, HYDROcodone-acetaminophen, magnesium citrate, morphine injection, ondansetron **OR** ondansetron (ZOFRAN) IV, senna-docusate, traZODone  Physical Exam  Constitutional: [name] oriented to person, place, and time. She appears distressed.  HENT:  Head: Normocephalic and atraumatic.  Eyes: EOM are normal.  Cardiovascular: Normal rate and regular rhythm.   Pulmonary/Chest: She has wheezes.  Audible wheezing, increased effort  Neurological: She  is alert and oriented to person, place, and time.  Skin: Skin is warm and dry.  Psychiatric:  Anxious, tearful            Vital Signs: BP 132/58 mmHg  Pulse 83  Temp(Src) 98.2 F (36.8 C) (Oral)  Resp 13  Ht _0  (1.575 m)  Wt 83.371 kg (183 lb 12.8 oz)  BMI 33.61 kg/m2  SpO2 100% SpO2: SpO2: 100 % O2 Device: O2 Device: Nasal Cannula O2 Flow Rate: O2 Flow Rate (L/min): 4 L/min  Intake/output summary:  Intake/Output Summary (Last 24 hours) at 03/21/16 1123 Last data filed at 03/21/16 0800  Gross per 24 hour  Intake 1084.84 ml  Output   3925 ml  Net -2840.16 ml   LBM: Last BM Date: 03/10/16 Baseline Weight: Weight: 86.1 kg (189 lb 13.1 oz) Most recent weight: Weight: 83.371 kg (183 lb 12.8 oz)       Palliative Assessment/Data: PPS: 30%   Flowsheet Rows        Most Recent Value   Intake Tab    Referral Department  Hospitalist   Unit at Time of Referral  ICU   Palliative Care Primary Diagnosis  Pulmonary   Date Notified  03/19/16   Palliative Care Type  New Palliative care   Reason for referral  Clarify Goals of Care   Date of Admission  03/16/2016   Date first seen by Palliative Care  03/20/16   # of days Palliative referral response time  1 Day(s)   # of days IP prior to Palliative referral  1   Clinical Assessment    Palliative Performance Scale Score  30%   Psychosocial & Spiritual Assessment    Palliative Care Outcomes    Patient/Family meeting held?  Yes      Patient Active Problem List   Diagnosis Date Noted  . Acute dyspnea   . Encounter for palliative care   . Goals of care, counseling/discussion   . Acute on chronic respiratory failure with hypoxia ([place])   . Lung problems from crack cocaine ([place])   . Chronic diastolic CHF (congestive heart failure) ([place])   . Substance abuse   . Uncontrolled type 2 diabetes mellitus with complication ([place])   . Sepsis ([place])  02/23/2016  . Sepsis, unspecified organism ([place])   . COPD with exacerbation ([place])  03/10/2016  . Insulin dependent diabetes mellitus ([place])   . Dyslipidemia 03/09/2016  . Acute CHF (congestive heart failure) ([place]) 03/02/2016  . Anemia 03/02/2016  . Respiratory failure ([place]) 01/14/2016  . Hyperlipidemia 01/14/2016  . Rheumatic mitral stenosis 01/14/2016  . Acute respiratory failure with hypoxia ([place])   . Urinary retention with incomplete bladder emptying 01/11/2016  . ARF (acute respiratory failure) ([place]) 01/11/2016  . Seizures ([place]) 01/11/2016  . Essential hypertension   . Anxiety state   . Bipolar affective disorder in remission ([place])   . Debility 01/09/2016  . Dysphagia 01/09/2016  . ARDS (adult respiratory distress syndrome) ([place])   . Acute encephalopathy   . HCAP (healthcare-associated pneumonia)   . Acute on chronic respiratory failure ([place]) 12/25/2015  . COPD exacerbation ([place]) 12/25/2015  . Acute on chronic congestive heart failure ([place])   . Bilateral edema of lower extremity   . Acute respiratory failure with hypoxemia ([place])   . Hypotension due to drugs   . Chronic respiratory failure ([place]) 12/24/2015  . Acute on chronic diastolic CHF (congestive heart failure), NYHA class 1 ([place]) 12/23/2015  . Anemia of chronic disease 12/23/2015  . Cellulitis of leg, left 12/23/2015  . Chronic diastolic heart failure ([place]) 10/13/2015  . DM type 2, uncontrolled, with renal complications ([place]) 81/09/7508  . Bipolar I disorder ([place])   . Agoraphobia   . Physical deconditioning 04/21/2015  . Hypothyroidism 04/21/2015  . GERD without esophagitis 04/21/2015  . Generalized anxiety disorder 04/12/2015  . Moderate mitral stenosis 10/06/2013  . COPD (chronic obstructive pulmonary disease) ([place]) 10/04/2011  . Tobacco abuse 10/04/2011  . Diabetes mellitus, insulin dependent (IDDM), uncontrolled ([place]) 07/29/2011  . Depression 07/29/2011  . HTN (hypertension) 07/29/2011    Palliative Care Assessment & Plan   Patient Profile:  Assessment/Recommendations/Plan:  Shortness  of breath: increased morphine to 2 mg q2hr prn SOB  Anxiety: increased lorazepam to 1 mg q2hr prn anxiety and SOB  Advanced Care planning: DNR, chaplain assistance appreciated with HCPOA paperwork, will discuss Hospice Care at next meeting  Goals of care: slow transition to comfort care, do not escalate care, PMT will continue to assist with symptom management  Goals of Care and Additional Recommendations:  Limitations on Scope of Treatment: No Artificial Feeding, No Hemodialysis and No Tracheostomy  Code Status:    Code Status Orders        Start     Ordered   03/21/16 1119  Do not attempt resuscitation (DNR)   Continuous    Question Answer Comment  In the event of cardiac or respiratory ARREST Do not call a "code blue"   In the event of cardiac or respiratory ARREST Do not perform Intubation, CPR, defibrillation or ACLS   In the event of cardiac or respiratory ARREST Use medication by any route, position, wound care, and other measures to relive pain and suffering. May use oxygen, suction and manual treatment of airway obstruction as needed for comfort.      03/21/16 1118    Code Status History    Date Active Date Inactive Code Status Order ID Comments User Context   03/20/2016 11:12 AM 03/21/2016 11:18 AM Full Code [id number]  Earlie Counts, NP Inpatient   03/20/2016  8:57 AM 03/20/2016 11:12 AM DNR [id number]  Earlie Counts, NP Inpatient   03/12/2016  6:16 PM 03/20/2016  8:57 AM Full Code [id number]  Coralee Pesa  Shawn Route, PA-C Inpatient   03/07/2016  6:16 PM 03/14/2016  6:16 PM Partial Code [id number]  Rondel Jumbo, PA-C Inpatient   02/23/2016  5:35 PM 03/22/2016  6:16 PM Partial Code [id number]  Rondel Jumbo, PA-C Inpatient   02/29/2016  2:05 PM 03/07/2016  5:35 PM Full Code [id number]  Rondel Jumbo, PA-C ED   03/10/2016 12:17 AM 03/13/2016  3:06 PM Full Code [id number]  Vianne Bulls, MD ED   03/02/2016  6:31 AM 03/08/2016  6:40 PM Full Code [id number]  Elwin Mocha, MD Inpatient   01/11/2016   5:18 PM 01/26/2016  5:57 PM Full Code [id number]  Waldemar Dickens, MD Inpatient   01/09/2016  3:07 PM 01/11/2016  5:18 PM Full Code [id number]  Bary Leriche, PA-C Inpatient   01/09/2016  3:07 PM 01/09/2016  3:07 PM Full Code [id number]  Bary Leriche, PA-C Inpatient   12/23/2015  3:31 AM 01/09/2016  3:07 PM Full Code [id number]  Edwin Dada, MD Inpatient   10/16/2015  9:42 PM 10/17/2015  4:30 PM Full Code [id number]  Thurnell Lose, MD ED   10/12/2015 11:07 PM 10/14/2015  2:01 PM Full Code [id number]  Meredith Pel, MD Inpatient   07/29/2015  4:52 PM 07/31/2015  6:39 PM Full Code [id number]  Melton Alar, PA-C Inpatient   04/12/2015  9:11 PM 04/18/2015  6:32 PM Full Code [id number]  Melton Alar, PA-C Inpatient   08/28/2013  4:44 AM 08/30/2013  6:05 PM Full Code [id number]  Toy Baker, MD Inpatient   07/06/2012  3:30 PM 07/07/2012  7:57 PM Full Code [id number]  Monico Blitz, PA-C ED   10/04/2011  7:24 AM 10/07/2011  5:45 PM Full Code [id number]  Davene Costain, RN ED   08/11/2011  1:11 PM 08/19/2011  6:19 PM Full Code [id number]  Samuella Cota, MD Inpatient   08/09/2011 10:43 PM 08/11/2011  1:11 PM Full Code [id number]  Thermon Leyland, RN Inpatient   08/05/2011  1:16 PM 08/09/2011 10:43 PM Full Code [id number]  Nat Math, MD Inpatient   07/29/2011  2:11 AM 07/30/2011 12:59 PM Full Code [id number]  Johnna Acosta, MD ED   07/28/2011  9:53 PM 07/28/2011 11:27 PM Full Code [id number]  Camila Li, RN ED       Prognosis:   < 3 months due to chronic advanced lung disease, decreased functional status, and PPS 30%  Discharge Planning:  To Be Determined will be dependent on patient's trajectory- expect home with hospice.  Care plan was discussed with Patient and Spouse- [name].  Thank you for allowing the Palliative Medicine Team to assist in the care of this patient.   Time In: 1030 Time Out: 1140 Total Time 70 min Prolonged Time Billed  No       Greater than 50%  of this  time was spent counseling and coordinating care related to the above assessment and plan.  Mariana Kaufman, AGNP-C  Please contact Palliative Medicine Team phone at [phone] for questions and concerns.

## 2016-03-21 NOTE — Progress Notes (Signed)
Significant other [place] Silos(James Weeks) notified to come by tomorrow at 10 am to fill out HCPOA paperwork. Chaplain notified and aware. Will continue to monitor.

## 2016-03-21 NOTE — Progress Notes (Signed)
Received call from pt's nse that fam concerned when pt does pass away that they cannot afford funeral and wanted inform on body donation. Found several local programs and printed information and gave to husband. Body donation has to be arranged prior to person passing away. Husband also req inform on cremation also. Have made sw ref. Spoke w sw sarah and asked her to see husband as soon as possible. Husband states kids at home and needs to get home but needs to see sw prior to leaving.

## 2016-03-21 NOTE — Progress Notes (Signed)
ANTIBIOTIC CONSULT NOTE - INITIAL  Pharmacy Consult for vancomycin Indication: pneumonia  Allergies  Allergen Reactions  . Trazodone And Nefazodone Anaphylaxis and Other (See Comments)    Shortness of breath.  . Celecoxib Nausea Only  . Ibuprofen Nausea Only    Patient Measurements: Height: 5\' 2"  (157.5 cm) Weight: 183 lb 12.8 oz (83.371 kg) IBW/kg (Calculated) : 50.1 Adjusted Body Weight:   Vital Signs: Temp: 98.4 F (36.9 C) (06/29 1445) Temp Source: Axillary (06/29 1445) BP: 121/58 mmHg (06/29 1445) Pulse Rate: 79 (06/29 1445) Intake/Output from previous day: 06/28 0701 - 06/29 0700 In: 1034.8 [P.O.:240; I.V.:539.8; IV Piggyback:255] Out: 3850 [Urine:3850] Intake/Output from this shift:    Labs:  Recent Labs  03/19/16 0424 03/20/16 0510 03/21/16 0548  WBC 15.3* 19.1* 14.8*  HGB 8.8* 9.6* 9.6*  PLT 341 323 256  CREATININE 0.78 0.76 0.81   Estimated Creatinine Clearance: 77.6 mL/min (by C-G formula based on Cr of 0.81). No results for input(s): VANCOTROUGH, VANCOPEAK, VANCORANDOM, GENTTROUGH, GENTPEAK, GENTRANDOM, TOBRATROUGH, TOBRAPEAK, TOBRARND, AMIKACINPEAK, AMIKACINTROU, AMIKACIN in the last 72 hours.   Microbiology: Recent Results (from the past 720 hour(s))  Culture, respiratory (NON-Expectorated)     Status: None   Collection Time: 03/10/16  7:30 AM  Result Value Ref Range Status   Specimen Description SPUTUM  Final   Special Requests NONE  Final   Gram Stain   Final    NO WBC SEEN NO ORGANISMS SEEN GRAM STAIN REVIEWED-AGREE WITH RESULT V WILKINS    Culture   Final    Consistent with normal respiratory flora. [facility]    Report Status 03/12/2016 FINAL  Final  Culture, sputum-assessment     Status: None   Collection Time: 03/10/16  7:32 AM  Result Value Ref Range Status   Specimen Description SPUTUM  Final   Special Requests NONE  Final   Sputum evaluation   Final    THIS SPECIMEN IS ACCEPTABLE. RESPIRATORY CULTURE  REPORT TO FOLLOW.   Report Status 03/10/2016 FINAL  Final  Blood Culture (routine x 2)     Status: None (Preliminary result)   Collection Time: 02/23/2016  9:45 AM  Result Value Ref Range Status   Specimen Description BLOOD LEFT ANTECUBITAL  Final   Special Requests BOTTLES DRAWN AEROBIC AND ANAEROBIC 5CC  Final   Culture NO GROWTH 3 DAYS  Final   Report Status PENDING  Incomplete  Blood Culture (routine x 2)     Status: None (Preliminary result)   Collection Time: 02/25/2016 10:14 AM  Result Value Ref Range Status   Specimen Description BLOOD RIGHT ANTECUBITAL  Final   Special Requests BOTTLES DRAWN AEROBIC AND ANAEROBIC 5CC  Final   Culture NO GROWTH 3 DAYS  Final   Report Status PENDING  Incomplete  Urine culture     Status: Abnormal   Collection Time: 03/19/2016 10:50 AM  Result Value Ref Range Status   Specimen Description URINE, RANDOM  Final   Special Requests NONE  Final   Culture MULTIPLE SPECIES PRESENT, SUGGEST RECOLLECTION (A)  Final   Report Status 03/19/2016 FINAL  Final  MRSA PCR Screening     Status: None   Collection Time: 03/02/2016  8:20 PM  Result Value Ref Range Status   MRSA by PCR NEGATIVE NEGATIVE Final    Comment:        The GeneXpert MRSA Assay (FDA approved for NASAL specimens only), is one component of a comprehensive MRSA colonization surveillance program. It is  not intended to diagnose MRSA infection nor to guide or monitor treatment for MRSA infections.   Culture, Urine     Status: None   Collection Time: 03/19/16  8:52 PM  Result Value Ref Range Status   Specimen Description URINE, CATHETERIZED  Final   Special Requests NONE  Final   Culture NO GROWTH  Final   Report Status 03/21/2016 FINAL  Final    Medical History: Past Medical History  Diagnosis Date  . Hypertension   . Anemia   . Chronic back pain     "all over"  . Neuropathy (HCC)   . Hypercholesterolemia   . Major depressive disorder, recurrent (HCC)   . Generalized anxiety  disorder   . Panic disorder with agoraphobia   . Tobacco abuse   . MI (myocardial infarction) (HCC) 2007    a. Per patient report she had a heart attack in 2007. Our consult note from 11/2006 indicates the patient had been seen in 07/2006 by her PCP and was told based on an EKG that she may have had a prior MI. She had undergone a low risk stress test at that time.  . Type II diabetes mellitus (HCC)   . Heart murmur   . Emphysema of lung (HCC)   . Pneumonia 1960's X 2  . On home oxygen therapy     "2L prn" (04/12/2015)  . Thin blood (HCC)   . History of blood transfusion     "related to OR"  . GERD (gastroesophageal reflux disease)   . Arthritis     "hands, left knee, left shoulder, back feet" (04/12/2015)  . Bipolar disorder (HCC)   . Rheumatic fever   . Mitral stenosis   . COPD (chronic obstructive pulmonary disease) (HCC)   . Hypothyroidism   . Chronic back pain   . PONV (postoperative nausea and vomiting)   . Seizures (HCC)     at age 89  . CHF (congestive heart failure) (HCC)   . Fracture of right humerus 04/13/2015  . Left patella fracture 04/12/2015  . Shoulder fracture, right 04/12/2015  . Shortness of breath dyspnea     Medications:  Scheduled:  . ARIPiprazole  5 mg Oral Daily  . aspirin EC  81 mg Oral Daily  . ceFEPime (MAXIPIME) IV  1 g Intravenous Q8H  . dextromethorphan-guaiFENesin  1 tablet Oral BID  . enoxaparin (LOVENOX) injection  40 mg Subcutaneous Q24H  . furosemide  80 mg Intravenous BID  . insulin aspart  0-20 Units Subcutaneous Q4H  . insulin glargine  20 Units Subcutaneous Daily  . ipratropium-albuterol  3 mL Nebulization Q6H  . levothyroxine  125 mcg Oral QAC breakfast  . methylPREDNISolone (SOLU-MEDROL) injection  60 mg Intravenous Q6H  . pantoprazole  40 mg Oral Daily  . potassium chloride SA  20 mEq Oral Daily  . simvastatin  10 mg Oral QPM  . sodium chloride flush  3 mL Intravenous Q12H  . valproate sodium  250 mg Intravenous Q12H   Infusions:   . sodium chloride 10 mL/hr at 03/20/16 2300   Assessment: 55 yo female with pneumonia will be started on vancomycin.  CrCl ~77.6  Goal of Therapy:  Vancomycin trough level 15-20 mcg/ml  Plan:  - vancomycin 1g iv q12h - monitor renal function - check vancomycin trough when it's appropriate  Ladawna Walgren, Tsz-Yin 03/21/2016,7:09 PM

## 2016-03-21 NOTE — Clinical Social Work Note (Addendum)
CSW was unable to locate many financial assistance resources for funeral expenses. Most resources discussed low-cost cremation. CSW did read that funeral directors usually knew of financial assistance resources. CSW printed off information on low-cost cremation, a Artistfinancial counselor that assists with funeral planning, and a list of local funeral homes. CSW will speak with one of the social work supervisors tomorrow about any other options that may be available. CSW called patient's significant other with this information. He will be here tomorrow for a 10:00 meeting [facility].  Charlynn CourtSarah Chandlor Noecker, CSW [phone]  4:26 pm Per CSW director, there are not any resources for financial assistance for funeral/burial. Funeral homes are apparently not often connected with any resources. [facility] does cremations for [phone]. Will notify patient tomorrow. Unknown if he is able to afford this amount or if cremation is an option for he and the patient.  Charlynn CourtSarah Kourtney Montesinos, CSW [phone]

## 2016-03-22 LAB — GLUCOSE, CAPILLARY
GLUCOSE-CAPILLARY: 195 mg/dL — AB (ref 65–99)
Glucose-Capillary: 159 mg/dL — ABNORMAL HIGH (ref 65–99)
Glucose-Capillary: 186 mg/dL — ABNORMAL HIGH (ref 65–99)
Glucose-Capillary: 232 mg/dL — ABNORMAL HIGH (ref 65–99)
Glucose-Capillary: 250 mg/dL — ABNORMAL HIGH (ref 65–99)
Glucose-Capillary: 394 mg/dL — ABNORMAL HIGH (ref 65–99)

## 2016-03-22 NOTE — Progress Notes (Signed)
Daily Progress Note   Patient Name: [name]       Date: 03/22/2016 DOB: [birth year]  Age: 55 y.o. MRN#: [id number] Attending Physician: Drema Dallas, MD Primary Care Physician: [name], MD Admit Date: Apr 16, 2016  Reason for Consultation/Follow-up: Establishing goals of care and Non pain symptom management  Subjective:  HPI: 55 y.o. female with past medical history of HTN, anemia, depression, bipolar disorder, MI, COPD with O2 dependency, CHF, GERD, hypothryoidism and crack/cocaine drug abuse admitted on 04-16-2016 with acute on chronic respiratory failure r/t HCAP (recently discharged from hospital on 6/21 d/t CHF/COPO exacerbation). She has had 8 hospital admissions in the last 6 months. 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      Patient is resting in bed, she is awake, not entirely alert. Her spouse is present at the bedside Discussed with bedside RN, agree with Dr Joseph Art recommendations and note, recommend residential hospice.  Appreciate chaplain consult Patient wants to marry her significant other of 24 years, [name].  Length of Stay: 4  Current Medications: Scheduled Meds:  . ARIPiprazole  5 mg Oral Daily  . aspirin EC  81 mg Oral Daily  . ceFEPime (MAXIPIME) IV  1 g Intravenous Q8H  . dextromethorphan-guaiFENesin  1 tablet Oral BID  . enoxaparin (LOVENOX) injection  40 mg Subcutaneous Q24H  . furosemide  80 mg Intravenous BID  . insulin aspart  0-20 Units Subcutaneous Q4H  . insulin glargine  20 Units Subcutaneous Daily  . ipratropium-albuterol  3 mL Nebulization QID  . levothyroxine  62.5 mcg Intravenous Daily  . methylPREDNISolone (SOLU-MEDROL) injection  60 mg Intravenous Q6H  . pantoprazole  40  mg Oral Daily  . potassium chloride SA  20 mEq Oral Daily  . simvastatin  10 mg Oral QPM  . sodium chloride flush  3 mL Intravenous Q12H  . valproate sodium  250 mg Intravenous Q12H  . vancomycin  1,000 mg Intravenous Q12H    Continuous Infusions: . sodium chloride 10 mL/hr at 03/20/16 2300    PRN Meds: bisacodyl, HYDROcodone-acetaminophen, ipratropium-albuterol, LORazepam, magnesium citrate, morphine injection, ondansetron **OR** ondansetron (ZOFRAN) IV, senna-docusate, traZODone  Physical Exam  Constitutional: [name] weak, pale appearing. She appears distressed.  HENT:  Head: Normocephalic and atraumatic.  Eyes: EOM  are normal.  Cardiovascular: Normal rate and regular rhythm.   Pulmonary/Chest: She has wheezes.  Audible wheezing, increased effort   Skin: Skin is warm and dry.  Psychiatric:  Anxious,             Vital Signs: BP 124/66 mmHg  Pulse 80  Temp(Src) 97.7 F (36.5 C) (Oral)  Resp 21  Ht 5\' 2"  (1.575 m)  Wt 82.283 kg (181 lb 6.4 oz)  BMI 33.17 kg/m2  SpO2 98% SpO2: SpO2: 98 % O2 Device: O2 Device: Nasal Cannula O2 Flow Rate: O2 Flow Rate (L/min): 4 L/min  Intake/output summary:   Intake/Output Summary (Last 24 hours) at 03/22/16 1129 Last data filed at 03/22/16 1111  Gross per 24 hour  Intake    893 ml  Output   3950 ml  Net  -3057 ml   LBM: Last BM Date: 03/10/16 Baseline Weight: Weight: 86.1 kg (189 lb 13.1 oz) Most recent weight: Weight: 82.283 kg (181 lb 6.4 oz)       Palliative Assessment/Data: PPS: 30%   Flowsheet Rows        Most Recent Value   Intake Tab    Referral Department  Hospitalist   Unit at Time of Referral  ICU   Palliative Care Primary Diagnosis  Pulmonary   Date Notified  03/19/16   Palliative Care Type  New Palliative care   Reason for referral  Clarify Goals of Care   Date of Admission  2016/09/15   Date first seen by Palliative Care  03/20/16   # of days Palliative referral response time  1 Day(s)   # of days IP  prior to Palliative referral  1   Clinical Assessment    Palliative Performance Scale Score  30%   Psychosocial & Spiritual Assessment    Palliative Care Outcomes    Patient/Family meeting held?  Yes      Patient Active Problem List   Diagnosis Date Noted  . Advance care planning   . Palliative care encounter   . Acute dyspnea   . Encounter for palliative care   . Goals of care, counseling/discussion   . Acute on chronic respiratory failure with hypoxia (HCC)   . Lung problems from crack cocaine (HCC)   . Chronic diastolic CHF (congestive heart failure) (HCC)   . Substance abuse   . Uncontrolled type 2 diabetes mellitus with complication (HCC)   . Sepsis (HCC) 06-May-2016  . Sepsis, unspecified organism (HCC)   . COPD with exacerbation (HCC) 03/10/2016  . Insulin dependent diabetes mellitus (HCC)   . Dyslipidemia 03/09/2016  . Acute CHF (congestive heart failure) (HCC) 03/02/2016  . Anemia 03/02/2016  . Respiratory failure (HCC) 01/14/2016  . Hyperlipidemia 01/14/2016  . Rheumatic mitral stenosis 01/14/2016  . Acute respiratory failure with hypoxia (HCC)   . Urinary retention with incomplete bladder emptying 01/11/2016  . ARF (acute respiratory failure) (HCC) 01/11/2016  . Seizures (HCC) 01/11/2016  . Essential hypertension   . Anxiety state   . Bipolar affective disorder in remission (HCC)   . Debility 01/09/2016  . Dysphagia 01/09/2016  . ARDS (adult respiratory distress syndrome) (HCC)   . Acute encephalopathy   . HCAP (healthcare-associated pneumonia)   . Acute on chronic respiratory failure (HCC) 12/25/2015  . COPD exacerbation (HCC) 12/25/2015  . Acute on chronic congestive heart failure (HCC)   . Bilateral edema of lower extremity   . Acute respiratory failure with hypoxemia (HCC)   . Hypotension due to drugs   .  Chronic respiratory failure (HCC) 12/24/2015  . Acute on chronic diastolic CHF (congestive heart failure), NYHA class 1 (HCC) 12/23/2015  . Anemia of  chronic disease 12/23/2015  . Cellulitis of leg, left 12/23/2015  . Chronic diastolic heart failure (HCC) 10/13/2015  . DM type 2, uncontrolled, with renal complications (HCC) 10/13/2015  . Bipolar I disorder (HCC)   . Agoraphobia   . Physical deconditioning 04/21/2015  . Hypothyroidism 04/21/2015  . GERD without esophagitis 04/21/2015  . Generalized anxiety disorder 04/12/2015  . Moderate mitral stenosis 10/06/2013  . COPD (chronic obstructive pulmonary disease) (HCC) 10/04/2011  . Tobacco abuse 10/04/2011  . Diabetes mellitus, insulin dependent (IDDM), uncontrolled (HCC) 07/29/2011  . Depression 07/29/2011  . HTN (hypertension) 07/29/2011    Palliative Care Assessment & Plan   Patient Profile:  Assessment/Recommendations/Plan:  Shortness of breath:   morphine to 2 mg q2hr prn SOB  Anxiety: lorazepam to 1 mg q2hr prn anxiety and SOB  Advanced Care planning: DNR, chaplain assistance appreciated with HCPOA paperwork, discussed Hospice Care with patient and significant other.   Goals of care: slow transition to comfort care, do not escalate care, PMT will continue to assist with symptom management  Goals of Care and Additional Recommendations:  Limitations on Scope of Treatment: No Artificial Feeding, No Hemodialysis and No Tracheostomy  Code Status:    Code Status Orders        Start     Ordered   03/21/16 1119  Do not attempt resuscitation (DNR)   Continuous    Question Answer Comment  In the event of cardiac or respiratory ARREST Do not call a "code blue"   In the event of cardiac or respiratory ARREST Do not perform Intubation, CPR, defibrillation or ACLS   In the event of cardiac or respiratory ARREST Use medication by any route, position, wound care, and other measures to relive pain and suffering. May use oxygen, suction and manual treatment of airway obstruction as needed for comfort.      03/21/16 1118    Code Status History    Date Active Date Inactive  Code Status Order ID Comments User Context   03/20/2016 11:12 AM 03/21/2016 11:18 AM Full Code [id number]  Barbara Cower, NP Inpatient   03/20/2016  8:57 AM 03/20/2016 11:12 AM DNR [id number]  Barbara Cower, NP Inpatient   03-19-2016  6:16 PM 03/20/2016  8:57 AM Full Code [id number]  Marcos Eke, PA-C Inpatient   March 19, 2016  6:16 PM 03-19-2016  6:16 PM Partial Code [id number]  Marcos Eke, PA-C Inpatient   03/19/2016  5:35 PM 03-19-2016  6:16 PM Partial Code [id number]  Marcos Eke, PA-C Inpatient   03/19/2016  2:05 PM 2016/03/19  5:35 PM Full Code [id number]  Marcos Eke, PA-C ED   03/10/2016 12:17 AM 03/13/2016  3:06 PM Full Code [id number]  Briscoe Deutscher, MD ED   03/02/2016  6:31 AM 03/08/2016  6:40 PM Full Code [id number]  Haydee Salter, MD Inpatient   01/11/2016  5:18 PM 01/26/2016  5:57 PM Full Code [id number]  Ozella Rocks, MD Inpatient   01/09/2016  3:07 PM 01/11/2016  5:18 PM Full Code [id number]  Jacquelynn Cree, PA-C Inpatient   01/09/2016  3:07 PM 01/09/2016  3:07 PM Full Code [id number]  Jacquelynn Cree, PA-C Inpatient   12/23/2015  3:31 AM 01/09/2016  3:07 PM Full Code [id number]  Alberteen Sam, MD Inpatient   10/16/2015  9:42 PM 10/17/2015  4:30 PM  Full Code [id number]  Leroy SeaPrashant K Singh, MD ED   10/12/2015 11:07 PM 10/14/2015  2:01 PM Full Code [id number]  Cammy CopaScott Gregory Dean, MD Inpatient   07/29/2015  4:52 PM 07/31/2015  6:39 PM Full Code [id number]  Stephani PoliceMarianne L York, PA-C Inpatient   04/12/2015  9:11 PM 04/18/2015  6:32 PM Full Code [id number]  Stephani PoliceMarianne L York, PA-C Inpatient   08/28/2013  4:44 AM 08/30/2013  6:05 PM Full Code [id number]  Therisa DoyneAnastassia Doutova, MD Inpatient   07/06/2012  3:30 PM 07/07/2012  7:57 PM Full Code [id number]  Wynetta EmeryNicole Pisciotta, PA-C ED   10/04/2011  7:24 AM 10/07/2011  5:45 PM Full Code [id number]  Norina BuzzardAshley Lavendra Kee, RN ED   08/11/2011  1:11 PM 08/19/2011  6:19 PM Full Code [id number]  Standley Brookinganiel P Goodrich, MD Inpatient   08/09/2011 10:43 PM 08/11/2011  1:11 PM Full Code [id number]  Rudene Christiansharity A  Ingram, RN Inpatient   08/05/2011  1:16 PM 08/09/2011 10:43 PM Full Code [id number]  Conley CanalSimbiso Ranga, MD Inpatient   07/29/2011  2:11 AM 07/30/2011 12:59 PM Full Code [id number]  Vida RollerBrian D Miller, MD ED   07/28/2011  9:53 PM 07/28/2011 11:27 PM Full Code [id number]  Ahmed Primaachel Gayle Hayden, RN ED       Prognosis:   ?[name] due to chronic advanced lung disease, decreased functional status, and PPS 30%  Discharge Planning:  Recommend residential hospice     Care plan was discussed with Patient and Spouse- Grandville SilosJames [name].  Thank you for allowing the Palliative Medicine Team to assist in the care of this patient.   Time In: 10 Time Out: 1025 Total Time 25 Prolonged Time Billed  No       Greater than 50%  of this time was spent counseling and coordinating care related to the above assessment and plan.   Rosalin HawkingZeba Karlynn Furrow MD [facility] palliative [facility] team [phone] office [phone] pager   Please contact Palliative Medicine Team phone at [phone] for questions and concerns.

## 2016-03-22 NOTE — Clinical Social Work Note (Signed)
CSW met with patient this morning. Significant other at bedside. CSW provided resources discussed yesterday. Patient repeatedly stated that she did not want to die. Chaplain came in at that point and provided spiritual care, reminding her of conversation they had yesterday.   [name], [place]

## 2016-03-22 NOTE — Progress Notes (Signed)
Inpatient Diabetes Program Recommendations  AACE/ADA: New Consensus Statement on Inpatient Glycemic Control (2015)  Target Ranges:  Prepandial:   less than 140 mg/dL      Peak postprandial:   less than 180 mg/dL (1-2 hours)      Critically ill patients:  140 - 180 mg/dL   Lab Results  Component Value Date   GLUCAP 394* 03/22/2016   HGBA1C 7.5* 01/11/2016    Review of Glycemic Control:  Results for [name], [name] (MRN [id number]) as of 03/22/2016 12:46  Ref. Range 03/21/2016 20:22 03/21/2016 23:10 03/22/2016 03:26 03/22/2016 08:22 03/22/2016 11:43  Glucose-Capillary Latest Ref Range: 65-99 mg/dL 829100 (H) 562192 (H) 130186 (H) 232 (H) 394 (H)    Inpatient Diabetes Program Recommendations:   Note that CBG's high and then low.  May consider increasing Lantus to 30 units daily in order to meet basal insulin needs.  In addition, consider reducing Novolog correction to moderate tid with meals and HS (instead of resistant q 4 hours).  Once patient is eating consistently may also consider adding Novolog meal coverage 4 units tid with meals-Hold if patient eats less than 50%.  Thanks, Beryl MeagerJenny Ajooni Karam, RN, BC-ADM Inpatient Diabetes Coordinator Pager [phone] (8a-5p)

## 2016-03-22 NOTE — Care Management Note (Signed)
Case Management Note  Patient Details  Name: [name] MRN: [id number] Date of Birth: Oct 15, 1960  Subjective/Objective:    CM talked with significant other re possible potential transfer to resdential hospice facility and he was in agreement [facility].  He stated that he would have no transportation if she was placed in another county and that he would then prefer to take her home with hospice.  Briefly discussed home hospice services and he stated he would need someone to remain in the home during the day to provide care - when advised that was not an option, he stated he plans to get the name of the hospice agency who provided that service for a neighbor.  Also discussed financial constraints surrounding funeral/burial/cremation and advised him that whole body donation would be a no-cost option.  He wants to investigate that option further.                  Expected Discharge Plan:  Hospice Medical Facility  In-House Referral:  Clinical Social Work  Discharge planning Services  CM Consult  Status of Service:  In process, will continue to follow  Magdalene RiverMayo, Chanika Byland T, RN 03/22/2016, 3:02 PM

## 2016-03-22 NOTE — Progress Notes (Signed)
   03/22/16 1000  Clinical Encounter Type  Visited With Patient;Family;Patient and family together  Visit Type Follow-up;Psychological support;Spiritual support;Social support  Spiritual Encounters  Spiritual Needs Emotional;Grief support  CH met with Jeneen Rinks (significant other) and pt; pt now understands her diagnosis more clearly after talk with Dr. Sherral Hammers yesterday; They are leaning towards inpatient hospice per his recommendation; Jeneen Rinks is concerned about will and POA not for health decisions; CSW gave Jeneen Rinks info on funeral/cremation; both pt and Jeneen Rinks asked about in-hospital wedding; [place] is following up with Spiritual Care on the viability and process if this is possible, given the couple being together 24 years; It is a death-bed request and every consideration will be looked at regarding policy.  Jeneen Rinks will be here only this AM as it takes 2 hours to commute by bus; He is primary caregiver of their 4 grandchildren.  In talking with them, please be direct and speak in a clear, non-clinical manner to get information to be understood - this has been [place] best way to communicate and articulate situation.  Gwynn Burly 10:37 AM

## 2016-03-22 NOTE — Progress Notes (Signed)
PROGRESS NOTE    Elizabeth DISE  ZOX:[id number] DOB: 05-28-1961 DOA: Mar 31, 2016 PCP: [name], MD   Brief Narrative:  55 y.o. female 54 y.o. WF PMHx Generalized Anxiety disorder, Bipolar 1 Disorder, Major depressive disorder, recurrent, Seizure ,Panic disorder with agoraphobia, HTN,Chronic Diastolic CHF, MI ,COPD on 3 L home O2 (noncompliant), Rheumatic fever, DM type II uncontrolled with, complications, Hypothyroidism, Polysubstance abuse (crack cocaine)  Presents to the ED for evaluation of dyspnea and cough. Patient is chronically ill and has had increasingly frequent hospitalizations for respiratory problems, last from 6/17-6/21 for COPD exacerbation. On route her sats were down to the 60's later improving with respiratory treatments and Solumedrol. Patient denies orthopnea or lower extremity edema at this time. Her dyspnea is at rest. She reports cough. She denies chest pain with cough without palpitations. Denies hemoptysis. Denies any abdominal pain. Has decreased appetite but denies nausea or vomiting Denies dizziness or vertigo. Denies lower extremity swelling. No confusion was reported.Denies any sick contacts.   Assessment & Plan:   Active Problems:   Diabetes mellitus, insulin dependent (IDDM), uncontrolled (HCC)   Depression   HTN (hypertension)   COPD (chronic obstructive pulmonary disease) (HCC)   Generalized anxiety disorder   Physical deconditioning   Hypothyroidism   GERD without esophagitis   Bipolar I disorder (HCC)   Chronic diastolic heart failure (HCC)   Anemia of chronic disease   Chronic respiratory failure (HCC)   Acute on chronic respiratory failure (HCC)   COPD exacerbation (HCC)   Hypotension due to drugs   HCAP (healthcare-associated pneumonia)   ARF (acute respiratory failure) (HCC)   Rheumatic mitral stenosis   Sepsis (HCC)   Sepsis, unspecified organism (HCC)   Acute on chronic respiratory failure with hypoxia (HCC)   Lung problems from  crack cocaine (HCC)   Chronic diastolic CHF (congestive heart failure) (HCC)   Substance abuse   Uncontrolled type 2 diabetes mellitus with complication (HCC)   Acute dyspnea   Encounter for palliative care   Goals of care, counseling/discussion   Advance care planning   Palliative care encounter   Acute on chronic respiratory failure with hypoxia -Patient with significant emphysema, and groundglass opacifications throughout all lung fields, 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 -Counseled patient that she stands a significant chance of not recovering, and if she did recover would continue to be O2 dependent, and most likely would not recover her previous voice.  -Consult to palliative care placed; short-term vs long-term care plans. Palliative care vs hospice. Patient with significant home issues grandchildren just removed by DSS and being care for by husband. -Titrate O2 to maintain SPO2 89-93%, to include use of BiPAP -Solu-Medrol 60 mg QID -DuoNeb QID -Flutter valve -Mucinex DM BID  Crack Lung/HCAP  -Leukocytosis, most likely secondary to Crack Lung, but may also have superinfection (HCAP), though less likely.  -likely reactive, Crack Lung? CT from 6/10 consistent with early Crack Lung   Chronic diastolic heart failure,  -TTE (12/26/15) with EF 60-65%, severe mitral stenosis, and grade 2 diastolic dysfunction 82 kg, at baseline.  -Strict in and out since admission - 7.6 L -Daily weight Filed Weights   03/20/16 0500 03/21/16 0348 03/22/16 0452  Weight: 85.6 kg (188 lb 11.4 oz) 83.371 kg (183 lb 12.8 oz) 82.283 kg (181 lb 6.4 oz)  -Hold Lasix until BP improves -Transfuse for hemoglobin<8  Hypertension/MV moderate to severe stenosis  -Lasix 80 mg BID -Patient currently  hypotensive hold all other BP medication   Substance abuse -UDS Positive opiates and benzodiazepine (corresponds to home medication) -Drug screen serum  pending  Type II Diabetes uncontrolled with complication -4/20 hemoglobin A1c= 7.5  -Lantus 20 units daily -Resistant SSI -Nothing by mouth secondary to not being able to stay off of BiPAP for at least 2 hours  Bipolar/ Anxiety -Ativan PRN  -Morphine PRN for comfort -Abilify 5 mg daily  -Depakote IV 250 mg  BID   Hypothyroid -Synthroid 125 g daily (for now 62.5 g IV)  Hyperlipidemia Continue home statins  GERD  Continue PPI  Anemia of chronic disease  Hemoglobin 10.2 on admission, at baseline  No acute issues at this time  -Transfuse 1 unit packed red blood cells if Hb less than 8 or acutely bleeding     DVT prophylaxis: Lovenox Code Status: DO NOT RESUSCITATE Family Communication: None Disposition Plan: Residential hospice  vs Home hospice   Consultants:  NP [facility]    Procedures/Significant Events:  6/26 PCXR; Worsening diffuse bilateral airspace disease   Cultures 6/26 blood left/right AC NGTD 6/26 urine positive multiple species 6/26 MRSA by PCR negative   Antimicrobials: Cefepime 6/26>> Vancomycin 6/26>>   Devices    LINES / TUBES:      Continuous Infusions: . sodium chloride 10 mL/hr at 03/20/16 2300     Subjective: 6/30 A/O 4, states comfortable     Objective: Filed Vitals:   03/22/16 1144 03/22/16 1223 03/22/16 1500 03/22/16 1607  BP: 112/60  112/70   Pulse: 95 87 93   Temp: 98.1 F (36.7 C)  98.4 F (36.9 C)   TempSrc: Oral  Oral   Resp: Height:      Weight:      SpO2: 100% 98% 97% 94%    Intake/Output Summary (Last 24 hours) at 03/22/16 1805 Last data filed at 03/22/16 1531  Gross per 24 hour  Intake    963 ml  Output   3325 ml  Net  -2362 ml   Filed Weights   03/20/16 0500 03/21/16 0348 03/22/16 0452  Weight: 85.6 kg (188 lb 11.4 oz) 83.371 kg (183 lb 12.8 oz) 82.283 kg (181 lb 6.4 oz)    Examination:  General: A/O 4, positive chronic respiratory distress, raspy  hoarse voice Eyes: negative scleral hemorrhage, negative anisocoria, negative icterus ENT: Negative Runny nose, negative gingival bleeding, Neck:  Negative scars, masses, torticollis, lymphadenopathy, JVD Lungs: poor air movement throughout all lung fields, positive expiratory wheezing partially secondary to tracheal stenosis (bronchial wheezing), negative  Cardiovascular: Regular rhythm and rate without murmur gallop or rub normal S1 and S2 Abdomen: Morbidly obese, negative abdominal pain, nondistended, positive soft, bowel sounds, no rebound, no ascites, no appreciable mass Extremities: No significant cyanosis, clubbing, or edema bilateral lower extremities Skin: Negative rashes, lesions, ulcers Psychiatric:  Negative depression,Positive anxiety, negative fatigue, negative mania  Central nervous system:  Cranial nerves II through XII intact, tongue/uvula midline, all extremities muscle strength 5/5, sensation intact throughout,  negative dysarthria, negative expressive aphasia, negative receptive aphasia.  .     Data Reviewed: Care during the described time interval was provided by me .  I have reviewed this patient's available data, including medical history, events of note, physical examination, and all test results as part of my evaluation. I have personally reviewed and interpreted all radiology studies.  CBC:  Recent Labs Lab 03/10/2016 0945 03/19/16 0424 03/20/16 0510 03/21/16 0548  WBC  15.1* 15.3* 19.1* 14.8*  NEUTROABS 14.0*  --  18.2*  --   HGB 10.2* 8.8* 9.6* 9.6*  HCT 34.5* 31.0* 32.7* 31.9*  MCV 75.8* 74.5* 75.5* 75.6*  PLT 373 341 323 256   Basic Metabolic Panel:  Recent Labs Lab 02/24/2016 0945 03/13/2016 1753 03/19/16 0424 03/20/16 0510 03/21/16 0548  NA 138 140 144 141 139  K 4.2 4.2 3.6 3.9 3.4*  CL 95* 103 101 98* 93*  CO2 30 30 34* 32 38*  GLUCOSE 478* 432* 150* 207* 253*  BUN 28* 23* 19 26* 28*  CREATININE 1.15* 0.87 0.78 0.76 0.81  CALCIUM 8.9 8.3*  9.1 9.1 8.9  MG  --   --   --  2.3  --    GFR: Estimated Creatinine Clearance: 77.1 mL/min (by C-G formula based on Cr of 0.81). Liver Function Tests:  Recent Labs Lab 03/09/2016 0945 03/13/2016 1753 03/19/16 0424  AST 18 12* 12*  ALT 12* 10* 10*  ALKPHOS 86 77 74  BILITOT 1.0 1.3* 1.0  PROT 6.6 5.9* 5.7*  ALBUMIN 3.5 3.2* 3.2*   No results for input(s): LIPASE, AMYLASE in the last 168 hours. No results for input(s): AMMONIA in the last 168 hours. Coagulation Profile:  Recent Labs Lab 03/21/2016 1753  INR 0.99   Cardiac Enzymes: No results for input(s): CKTOTAL, CKMB, CKMBINDEX, TROPONINI in the last 168 hours. BNP (last 3 results) No results for input(s): PROBNP in the last 8760 hours. HbA1C: No results for input(s): HGBA1C in the last 72 hours. CBG:  Recent Labs Lab 03/21/16 2310 03/22/16 0326 03/22/16 0822 03/22/16 1143 03/22/16 1527  GLUCAP 192* 186* 232* 394* 159*   Lipid Profile: No results for input(s): CHOL, HDL, LDLCALC, TRIG, CHOLHDL, LDLDIRECT in the last 72 hours. Thyroid Function Tests: No results for input(s): TSH, T4TOTAL, FREET4, T3FREE, THYROIDAB in the last 72 hours. Anemia Panel: No results for input(s): VITAMINB12, FOLATE, FERRITIN, TIBC, IRON, RETICCTPCT in the last 72 hours. Urine analysis:    Component Value Date/Time   COLORURINE YELLOW 03/21/2016 1050   APPEARANCEUR CLOUDY* 03/03/2016 1050   LABSPEC 1.019 03/04/2016 1050   PHURINE 6.0 03/12/2016 1050   GLUCOSEU >1000* 03/15/2016 1050   HGBUR SMALL* 03/02/2016 1050   BILIRUBINUR NEGATIVE 03/17/2016 1050   KETONESUR NEGATIVE 03/10/2016 1050   PROTEINUR NEGATIVE 03/06/2016 1050   UROBILINOGEN 0.2 07/29/2015 1948   NITRITE NEGATIVE 02/26/2016 1050   LEUKOCYTESUR TRACE* 02/26/2016 1050   Sepsis Labs: @LABRCNTIP (procalcitonin:4,lacticidven:4)  ) Recent Results (from the past 240 hour(s))  Blood Culture (routine x 2)     Status: None (Preliminary result)   Collection Time: 03/05/2016   9:45 AM  Result Value Ref Range Status   Specimen Description BLOOD LEFT ANTECUBITAL  Final   Special Requests BOTTLES DRAWN AEROBIC AND ANAEROBIC 5CC  Final   Culture NO GROWTH 4 DAYS  Final   Report Status PENDING  Incomplete  Blood Culture (routine x 2)     Status: None (Preliminary result)   Collection Time: 03/21/2016 10:14 AM  Result Value Ref Range Status   Specimen Description BLOOD RIGHT ANTECUBITAL  Final   Special Requests BOTTLES DRAWN AEROBIC AND ANAEROBIC 5CC  Final   Culture NO GROWTH 4 DAYS  Final   Report Status PENDING  Incomplete  Urine culture     Status: Abnormal   Collection Time: 03/11/2016 10:50 AM  Result Value Ref Range Status   Specimen Description URINE, RANDOM  Final   Special Requests NONE  Final  Culture MULTIPLE SPECIES PRESENT, SUGGEST RECOLLECTION (A)  Final   Report Status 03/19/2016 FINAL  Final  MRSA PCR Screening     Status: None   Collection Time: 20-Sep-2016  8:20 PM  Result Value Ref Range Status   MRSA by PCR NEGATIVE NEGATIVE Final    Comment:        The GeneXpert MRSA Assay (FDA approved for NASAL specimens only), is one component of a comprehensive MRSA colonization surveillance program. It is not intended to diagnose MRSA infection nor to guide or monitor treatment for MRSA infections.   Culture, Urine     Status: None   Collection Time: 03/19/16  8:52 PM  Result Value Ref Range Status   Specimen Description URINE, CATHETERIZED  Final   Special Requests NONE  Final   Culture NO GROWTH  Final   Report Status 03/21/2016 FINAL  Final         Radiology Studies: No results found.      Scheduled Meds: . ARIPiprazole  5 mg Oral Daily  . aspirin EC  81 mg Oral Daily  . ceFEPime (MAXIPIME) IV  1 g Intravenous Q8H  . dextromethorphan-guaiFENesin  1 tablet Oral BID  . enoxaparin (LOVENOX) injection  40 mg Subcutaneous Q24H  . furosemide  80 mg Intravenous BID  . insulin aspart  0-20 Units Subcutaneous Q4H  . insulin glargine   20 Units Subcutaneous Daily  . ipratropium-albuterol  3 mL Nebulization QID  . levothyroxine  62.5 mcg Intravenous Daily  . methylPREDNISolone (SOLU-MEDROL) injection  60 mg Intravenous Q6H  . pantoprazole  40 mg Oral Daily  . potassium chloride SA  20 mEq Oral Daily  . simvastatin  10 mg Oral QPM  . sodium chloride flush  3 mL Intravenous Q12H  . valproate sodium  250 mg Intravenous Q12H  . vancomycin  1,000 mg Intravenous Q12H   Continuous Infusions: . sodium chloride 10 mL/hr at 03/20/16 2300     LOS: 4 days    Time spent: 40 minutes    Arti Trang, Roselind MessierURTIS J, MD [facility] Pager [phone]   If 7PM-7AM, please contact night-coverage [email or web address] Password [email or web address] 03/22/2016, 6:05 PM

## 2016-03-23 LAB — GLUCOSE, CAPILLARY
Glucose-Capillary: 143 mg/dL — ABNORMAL HIGH (ref 65–99)
Glucose-Capillary: 114 mg/dL — ABNORMAL HIGH (ref 65–99)
Glucose-Capillary: 233 mg/dL — ABNORMAL HIGH (ref 65–99)
Glucose-Capillary: 215 mg/dL — ABNORMAL HIGH (ref 65–99)
Glucose-Capillary: 398 mg/dL — ABNORMAL HIGH (ref 65–99)

## 2016-03-23 LAB — CULTURE, BLOOD (ROUTINE X 2)
Culture: NO GROWTH
Culture: NO GROWTH

## 2016-03-23 MED ORDER — INSULIN GLARGINE 100 UNIT/ML ~~LOC~~ SOLN
28.0000 [IU] | Freq: Every day | SUBCUTANEOUS | Status: DC
Start: 2016-03-24 — End: 2016-03-27
  Administered 2016-03-24 – 2016-03-26 (×3): 28 [IU] via SUBCUTANEOUS
  Filled 2016-03-23 (×4): qty 0.28

## 2016-03-23 NOTE — Progress Notes (Signed)
PROGRESS NOTE    [name]  HYQ:[id number] [name] DOB: 1961-09-11 DOA: 02/29/2016 PCP: Dorrene GermanEdwin A Avbuere, MD   Brief Narrative:  55 y.o. female 55 y.o. WF PMHx Generalized Anxiety disorder, Bipolar 1 Disorder, Major depressive disorder, recurrent, Seizure ,Panic disorder with agoraphobia, HTN,Chronic Diastolic CHF, MI ,COPD on 3 L home O2 (noncompliant), Rheumatic fever, DM type II uncontrolled with, complications, Hypothyroidism, Polysubstance abuse (crack cocaine)  Presents to the ED for evaluation of dyspnea and cough. Patient is chronically ill and has had increasingly frequent hospitalizations for respiratory problems, last from 6/17-6/21 for COPD exacerbation. On route her sats were down to the 60's later improving with respiratory treatments and Solumedrol. Patient denies orthopnea or lower extremity edema at this time. Her dyspnea is at rest. She reports cough. She denies chest pain with cough without palpitations. Denies hemoptysis. Denies any abdominal pain. Has decreased appetite but denies nausea or vomiting Denies dizziness or vertigo. Denies lower extremity swelling. No confusion was reported.Denies any sick contacts.   Assessment & Plan:   Active Problems:   Diabetes mellitus, insulin dependent (IDDM), uncontrolled (HCC)   Depression   HTN (hypertension)   COPD (chronic obstructive pulmonary disease) (HCC)   Generalized anxiety disorder   Physical deconditioning   Hypothyroidism   GERD without esophagitis   Bipolar I disorder (HCC)   Chronic diastolic heart failure (HCC)   Anemia of chronic disease   Chronic respiratory failure (HCC)   Acute on chronic respiratory failure (HCC)   COPD exacerbation (HCC)   Hypotension due to drugs   HCAP (healthcare-associated pneumonia)   ARF (acute respiratory failure) (HCC)   Rheumatic mitral stenosis   Sepsis (HCC)   Sepsis, unspecified organism (HCC)   Acute on chronic respiratory failure with hypoxia (HCC)   Lung problems from  crack cocaine (HCC)   Chronic diastolic CHF (congestive heart failure) (HCC)   Substance abuse   Uncontrolled type 2 diabetes mellitus with complication (HCC)   Acute dyspnea   Encounter for palliative care   Goals of care, counseling/discussion   Advance care planning   Palliative care encounter   Acute on chronic respiratory failure with hypoxia/COPD exacerbation -Patient with significant emphysema, and groundglass opacifications throughout all lung fields, 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 -Counseled patient that she stands a significant chance of not recovering, and if she did recover would continue to be O2 dependent, and most likely would not recover her previous voice.  -Consult to palliative care placed; short-term vs long-term care plans. Palliative care vs hospice. Patient with significant home issues grandchildren just removed by DSS and being care for by husband. -Titrate O2 to maintain SPO2 89-93%, to include use of BiPAP -Solu-Medrol 60 mg QID -DuoNeb QID -Flutter valve -Mucinex DM BID  Crack Lung/HCAP  -Leukocytosis, most likely secondary to Crack Lung, but may also have superinfection (HCAP), though less likely.  -likely reactive, Crack Lung? CT from 6/10 consistent with early Crack Lung  -Completed course of antibiotics  Chronic diastolic heart failure,  -TTE (12/26/15) with EF 60-65%, severe mitral stenosis, and grade 2 diastolic dysfunction 82 kg, at baseline.  -Strict in and out since admission - 10 L -Daily weight Filed Weights   03/21/16 0348 03/22/16 0452 03/23/16 0450  Weight: 83.371 kg (183 lb 12.8 oz) 82.283 kg (181 lb 6.4 oz) 82.283 kg (181 lb 6.4 oz)  -Lasix 80 mg  BID -Transfuse for hemoglobin<8  Hypertension/MV moderate to severe stenosis  -Lasix  80 mg BID -Patient currently hypotensive hold all other BP medication   Substance abuse -UDS Positive opiates and benzodiazepine (corresponds to  home medication) -Drug screen serum pending  Type II Diabetes uncontrolled with complication -4/20 hemoglobin A1c= 7.5  -Increase Lantus 28 units daily -Resistant SSI -Nothing by mouth secondary to not being able to stay off of BiPAP for at least 2 hours  Bipolar/ Anxiety -Ativan PRN  -Morphine PRN for comfort -Abilify 5 mg daily  -Depakote IV 250 mg  BID   Hypothyroid -Synthroid 125 g daily (for now 62.5 g IV)  Hyperlipidemia Continue home statins  GERD  Continue PPI  Anemia of chronic disease  Hemoglobin 10.2 on admission, at baseline  No acute issues at this time  -Transfuse 1 unit packed red blood cells if Hb less than 8 or acutely bleeding     DVT prophylaxis: Lovenox Code Status: DO NOT RESUSCITATE Family Communication: None Disposition Plan: Residential hospice  vs Home hospice   Consultants:  NP [facility]    Procedures/Significant Events:  6/26 PCXR; Worsening diffuse bilateral airspace disease   Cultures 6/26 blood left/right AC NGTD 6/26 urine positive multiple species 6/26 MRSA by PCR negative   Antimicrobials: Cefepime 6/26>> 7/1 Vancomycin 6/26>> 7/1   Devices    LINES / TUBES:      Continuous Infusions: . sodium chloride 10 mL/hr at 03/20/16 2300     Subjective: 6/30 A/O 4, states comfortable     Objective: Filed Vitals:   03/23/16 1100 03/23/16 1157 03/23/16 1500 03/23/16 1526  BP: 134/66  126/59   Pulse: 87  83   Temp: 97.6 F (36.4 C)  98.2 F (36.8 C)   TempSrc: Oral  Oral   Resp: 13  11   Height:      Weight:      SpO2: 100% 100% 98% 97%    Intake/Output Summary (Last 24 hours) at 03/23/16 1835 Last data filed at 03/23/16 1807  Gross per 24 hour  Intake    680 ml  Output   2825 ml  Net  -2145 ml   Filed Weights   03/21/16 0348 03/22/16 0452 03/23/16 0450  Weight: 83.371 kg (183 lb 12.8 oz) 82.283 kg (181 lb 6.4 oz) 82.283 kg (181 lb 6.4 oz)    Examination:  General: A/O  4, positive Acute on chronic respiratory distress, raspy hoarse voice Eyes: negative scleral hemorrhage, negative anisocoria, negative icterus ENT: Negative Runny nose, negative gingival bleeding, Neck:  Negative scars, masses, torticollis, lymphadenopathy, JVD Lungs: poor air movement throughout all lung fields, positive expiratory wheezing partially secondary to tracheal stenosis (bronchial wheezing), negative  Cardiovascular: Regular rhythm and rate without murmur gallop or rub normal S1 and S2 Abdomen: Morbidly obese, negative abdominal pain, nondistended, positive soft, bowel sounds, no rebound, no ascites, no appreciable mass Extremities: No significant cyanosis, clubbing, or edema bilateral lower extremities Skin: Negative rashes, lesions, ulcers Psychiatric:  Negative depression,Positive anxiety, negative fatigue, negative mania  Central nervous system:  Cranial nerves II through XII intact, tongue/uvula midline, all extremities muscle strength 5/5, sensation intact throughout,  negative dysarthria, negative expressive aphasia, negative receptive aphasia.  .     Data Reviewed: Care during the described time interval was provided by me .  I have reviewed this patient's available data, including medical history, events of note, physical examination, and all test results as part of my evaluation. I have personally reviewed and interpreted all radiology studies.  CBC:  Recent Labs Lab  03/16/2016 0945 03/19/16 0424 03/20/16 0510 03/21/16 0548  WBC 15.1* 15.3* 19.1* 14.8*  NEUTROABS 14.0*  --  18.2*  --   HGB 10.2* 8.8* 9.6* 9.6*  HCT 34.5* 31.0* 32.7* 31.9*  MCV 75.8* 74.5* 75.5* 75.6*  PLT 373 341 323 256   Basic Metabolic Panel:  Recent Labs Lab 03/07/2016 0945 03/11/2016 1753 03/19/16 0424 03/20/16 0510 03/21/16 0548  NA 138 140 144 141 139  K 4.2 4.2 3.6 3.9 3.4*  CL 95* 103 101 98* 93*  CO2 30 30 34* 32 38*  GLUCOSE 478* 432* 150* 207* 253*  BUN 28* 23* 19 26* 28*    CREATININE 1.15* 0.87 0.78 0.76 0.81  CALCIUM 8.9 8.3* 9.1 9.1 8.9  MG  --   --   --  2.3  --    GFR: Estimated Creatinine Clearance: 77.1 mL/min (by C-G formula based on Cr of 0.81). Liver Function Tests:  Recent Labs Lab 02/26/2016 0945 02/22/2016 1753 03/19/16 0424  AST 18 12* 12*  ALT 12* 10* 10*  ALKPHOS 86 77 74  BILITOT 1.0 1.3* 1.0  PROT 6.6 5.9* 5.7*  ALBUMIN 3.5 3.2* 3.2*   No results for input(s): LIPASE, AMYLASE in the last 168 hours. No results for input(s): AMMONIA in the last 168 hours. Coagulation Profile:  Recent Labs Lab 03/21/2016 1753  INR 0.99   Cardiac Enzymes: No results for input(s): CKTOTAL, CKMB, CKMBINDEX, TROPONINI in the last 168 hours. BNP (last 3 results) No results for input(s): PROBNP in the last 8760 hours. HbA1C: No results for input(s): HGBA1C in the last 72 hours. CBG:  Recent Labs Lab 03/22/16 2353 03/23/16 0306 03/23/16 0738 03/23/16 1146 03/23/16 1519  GLUCAP 250* 143* 114* 233* 215*   Lipid Profile: No results for input(s): CHOL, HDL, LDLCALC, TRIG, CHOLHDL, LDLDIRECT in the last 72 hours. Thyroid Function Tests: No results for input(s): TSH, T4TOTAL, FREET4, T3FREE, THYROIDAB in the last 72 hours. Anemia Panel: No results for input(s): VITAMINB12, FOLATE, FERRITIN, TIBC, IRON, RETICCTPCT in the last 72 hours. Urine analysis:    Component Value Date/Time   COLORURINE YELLOW 02/28/2016 1050   APPEARANCEUR CLOUDY* 03/14/2016 1050   LABSPEC 1.019 03/14/2016 1050   PHURINE 6.0 02/29/2016 1050   GLUCOSEU >1000* 03/11/2016 1050   HGBUR SMALL* 03/08/2016 1050   BILIRUBINUR NEGATIVE 03/16/2016 1050   KETONESUR NEGATIVE 03/22/2016 1050   PROTEINUR NEGATIVE 03/04/2016 1050   UROBILINOGEN 0.2 07/29/2015 1948   NITRITE NEGATIVE 02/28/2016 1050   LEUKOCYTESUR TRACE* 03/14/2016 1050   Sepsis Labs: (procalcitonin:4,lacticidven:4)  ) Recent Results (from the past 240 hour(s))  Blood Culture (routine x 2)      Status: None   Collection Time: 02/25/2016  9:45 AM  Result Value Ref Range Status   Specimen Description BLOOD LEFT ANTECUBITAL  Final   Special Requests BOTTLES DRAWN AEROBIC AND ANAEROBIC 5CC  Final   Culture NO GROWTH 5 DAYS  Final   Report Status 03/23/2016 FINAL  Final  Blood Culture (routine x 2)     Status: None   Collection Time: 02/26/2016 10:14 AM  Result Value Ref Range Status   Specimen Description BLOOD RIGHT ANTECUBITAL  Final   Special Requests BOTTLES DRAWN AEROBIC AND ANAEROBIC 5CC  Final   Culture NO GROWTH 5 DAYS  Final   Report Status 03/23/2016 FINAL  Final  Urine culture     Status: Abnormal   Collection Time: 03/02/2016 10:50 AM  Result Value Ref Range Status   Specimen Description URINE, RANDOM  Final   Special Requests NONE  Final   Culture MULTIPLE SPECIES PRESENT, SUGGEST RECOLLECTION (A)  Final   Report Status 03/19/2016 FINAL  Final  MRSA PCR Screening     Status: None   Collection Time: 03/08/2016  8:20 PM  Result Value Ref Range Status   MRSA by PCR NEGATIVE NEGATIVE Final    Comment:        The GeneXpert MRSA Assay (FDA approved for NASAL specimens only), is one component of a comprehensive MRSA colonization surveillance program. It is not intended to diagnose MRSA infection nor to guide or monitor treatment for MRSA infections.   Culture, Urine     Status: None   Collection Time: 03/19/16  8:52 PM  Result Value Ref Range Status   Specimen Description URINE, CATHETERIZED  Final   Special Requests NONE  Final   Culture NO GROWTH  Final   Report Status 03/21/2016 FINAL  Final         Radiology Studies: No results found.      Scheduled Meds: . ARIPiprazole  5 mg Oral Daily  . aspirin EC  81 mg Oral Daily  . ceFEPime (MAXIPIME) IV  1 g Intravenous Q8H  . dextromethorphan-guaiFENesin  1 tablet Oral BID  . enoxaparin (LOVENOX) injection  40 mg Subcutaneous Q24H  . furosemide  80 mg Intravenous BID  . insulin aspart  0-20 Units  Subcutaneous Q4H  . insulin glargine  20 Units Subcutaneous Daily  . ipratropium-albuterol  3 mL Nebulization QID  . levothyroxine  62.5 mcg Intravenous Daily  . methylPREDNISolone (SOLU-MEDROL) injection  60 mg Intravenous Q6H  . pantoprazole  40 mg Oral Daily  . potassium chloride SA  20 mEq Oral Daily  . simvastatin  10 mg Oral QPM  . sodium chloride flush  3 mL Intravenous Q12H  . valproate sodium  250 mg Intravenous Q12H  . vancomycin  1,000 mg Intravenous Q12H   Continuous Infusions: . sodium chloride 10 mL/hr at 03/20/16 2300     LOS: 5 days    Time spent: 40 minutes    Sharetha Newson, Roselind MessierURTIS J, MD [facility] Pager [phone]   If 7PM-7AM, please contact night-coverage [email or web address] Password [email or web address] 03/23/2016, 6:35 PM

## 2016-03-23 NOTE — Progress Notes (Signed)
Patient is tolerating 4L nasal cannula well and not showing signs of respiratory distress. BiPAP not placed on at this time. RT will continue to monitor.

## 2016-03-23 DEATH — deceased

## 2016-03-24 LAB — GLUCOSE, CAPILLARY
GLUCOSE-CAPILLARY: 213 mg/dL — AB (ref 65–99)
GLUCOSE-CAPILLARY: 188 mg/dL — AB (ref 65–99)
GLUCOSE-CAPILLARY: 231 mg/dL — AB (ref 65–99)
GLUCOSE-CAPILLARY: 76 mg/dL (ref 65–99)
GLUCOSE-CAPILLARY: 112 mg/dL — AB (ref 65–99)
Glucose-Capillary: 53 mg/dL — ABNORMAL LOW (ref 65–99)
Glucose-Capillary: 307 mg/dL — ABNORMAL HIGH (ref 65–99)

## 2016-03-24 LAB — BASIC METABOLIC PANEL
ANION GAP: 16 — AB (ref 5–15)
BUN: 24 mg/dL — ABNORMAL HIGH (ref 6–20)
CALCIUM: 9 mg/dL (ref 8.9–10.3)
CHLORIDE: 78 mmol/L — AB (ref 101–111)
CO2: 44 mmol/L — AB (ref 22–32)
Creatinine, Ser: 0.65 mg/dL (ref 0.44–1.00)
GFR calc non Af Amer: >60 mL/min (ref >60)
Glucose, Bld: 198 mg/dL — ABNORMAL HIGH (ref 65–99)
Potassium: 4.3 mmol/L (ref 3.5–5.1)
Sodium: 138 mmol/L (ref 135–145)

## 2016-03-24 LAB — VANCOMYCIN, TROUGH: VANCOMYCIN TR: 18 ug/mL (ref 15–20)

## 2016-03-24 MED ORDER — LEVOTHYROXINE SODIUM 25 MCG PO TABS
125.0000 ug | ORAL_TABLET | Freq: Every day | ORAL | Status: DC
  Administered 2016-03-25 – 2016-03-27 (×3): 125 ug via ORAL
  Filled 2016-03-24 (×3): qty 1

## 2016-03-24 NOTE — Progress Notes (Signed)
PROGRESS NOTE    Elizabeth BuryMary J [name]  WJX:[id number] DOB: [birth year] DOA: 08/08/2016 PCP: Dorrene GermanEdwin A Avbuere, MD   Brief Narrative:  55 y.o. female 55 y.o. WF PMHx Generalized Anxiety disorder, Bipolar 1 Disorder, Major depressive disorder, recurrent, Seizure ,Panic disorder with agoraphobia, HTN,Chronic Diastolic CHF, MI ,COPD on 3 L home O2 (noncompliant), Rheumatic fever, DM type II uncontrolled with, complications, Hypothyroidism, Polysubstance abuse (crack cocaine)  Presents to the ED for evaluation of dyspnea and cough. Patient is chronically ill and has had increasingly frequent hospitalizations for respiratory problems, last from 6/17-6/21 for COPD exacerbation. On route her sats were down to the 60's later improving with respiratory treatments and Solumedrol. Patient denies orthopnea or lower extremity edema at this time. Her dyspnea is at rest. She reports cough. She denies chest pain with cough without palpitations. Denies hemoptysis. Denies any abdominal pain. Has decreased appetite but denies nausea or vomiting Denies dizziness or vertigo. Denies lower extremity swelling. No confusion was reported.Denies any sick contacts.   Assessment & Plan:   Active Problems:   Diabetes mellitus, insulin dependent (IDDM), uncontrolled (HCC)   Depression   HTN (hypertension)   COPD (chronic obstructive pulmonary disease) (HCC)   Generalized anxiety disorder   Physical deconditioning   Hypothyroidism   GERD without esophagitis   Bipolar I disorder (HCC)   Chronic diastolic heart failure (HCC)   Anemia of chronic disease   Chronic respiratory failure (HCC)   Acute on chronic respiratory failure (HCC)   COPD exacerbation (HCC)   Hypotension due to drugs   HCAP (healthcare-associated pneumonia)   ARF (acute respiratory failure) (HCC)   Rheumatic mitral stenosis   Sepsis (HCC)   Sepsis, unspecified organism (HCC)   Acute on chronic respiratory failure with hypoxia (HCC)   Lung problems from  crack cocaine (HCC)   Chronic diastolic CHF (congestive heart failure) (HCC)   Substance abuse   Uncontrolled type 2 diabetes mellitus with complication (HCC)   Acute dyspnea   Encounter for palliative care   Goals of care, counseling/discussion   Advance care planning   Palliative care encounter   Acute on chronic respiratory failure with hypoxia/COPD exacerbation -Patient with significant emphysema, and groundglass opacifications throughout all lung fields, 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 -Counseled patient that she stands a significant chance of not recovering, and if she did recover would continue to be O2 dependent, and most likely would not recover her previous voice.  -Consult to palliative care placed; short-term vs long-term care plans. Palliative care vs hospice. Patient with significant home issues grandchildren just removed by DSS and being care for by husband. -Titrate O2 to maintain SPO2 89-93%, to include use of BiPAP -Solu-Medrol 60 mg QID -DuoNeb QID -Flutter valve -Mucinex DM BID  Crack Lung/HCAP  -Leukocytosis, most likely secondary to Crack Lung, but may also have superinfection (HCAP), though less likely.  -likely reactive, Crack Lung? CT from 6/10 consistent with early Crack Lung  -Completed course of antibiotics -Awaiting residential hospice placement  Chronic diastolic heart failure,  -TTE (12/26/15) with EF 60-65%, severe mitral stenosis, and grade 2 diastolic dysfunction 82 kg, at baseline.  -Strict in and out since admission - 12.2 L -Daily weight Filed Weights   03/22/16 0452 03/23/16 0450 03/24/16 0511  Weight: 82.283 kg (181 lb 6.4 oz) 82.283 kg (181 lb 6.4 oz) 80.7 kg (177 lb 14.6 oz)  -Lasix 80 mg  BID -Transfuse for hemoglobin<8 Recent Labs Lab June 19, 2016  0945 03/19/16 0424 03/20/16 0510 03/21/16 0548  HGB 10.2* 8.8* 9.6* 9.6*  -Stable H/H  Hypertension/MV moderate to severe  stenosis  -Lasix 80 mg BID -Patient currently hypotensive hold all other BP medication   Substance abuse -UDS Positive opiates and benzodiazepine (corresponds to home medication) -Drug screen serum pending  Type II Diabetes uncontrolled with complication -4/20 hemoglobin A1c= 7.5  -Increase Lantus 28 units daily -Resistant SSI  Bipolar/ Anxiety -Ativan PRN  -Morphine PRN for comfort -Abilify 5 mg daily  -Depakote IV 250 mg  BID   Hypothyroid -Synthroid 125 g daily  Hyperlipidemia  GERD  Continue PPI  Anemia of chronic disease  -Hemoglobin 10.2 on admission, at baseline  -No acute issues at this time    Goals of care -7/2 Spoke with Dr.Zeba Anwar [facility]    DVT prophylaxis: Lovenox Code Status: DO NOT RESUSCITATE Family Communication: None Disposition Plan: Residential Hospice     Consultants:  NP Dannette BarbaraKasie J Mahan/Dr.Zeba Anwar Palliative Care    Procedures/Significant Events:  6/26 PCXR; Worsening diffuse bilateral airspace disease   Cultures 6/26 blood left/right AC NGTD 6/26 urine positive multiple species 6/26 MRSA by PCR negative   Antimicrobials: Cefepime 6/26>> 7/1 Vancomycin 6/26>> 7/1   Devices    LINES / TUBES:      Continuous Infusions: . sodium chloride 10 mL/hr at 03/20/16 2300     Subjective: 7/2  A/O 4, states comfortable     Objective: Filed Vitals:   03/24/16 0720 03/24/16 0728 03/24/16 1112 03/24/16 1120  BP: 124/66   94/70  Pulse: 85     Temp: 97.9 F (36.6 C)   98.2 F (36.8 C)  TempSrc: Oral   Oral  Resp: 14     Height:      Weight:      SpO2: 97% 100% 100%     Intake/Output Summary (Last 24 hours) at 03/24/16 1442 Last data filed at 03/24/16 1120  Gross per 24 hour  Intake  197.5 ml  Output   2280 ml  Net -2082.5 ml   Filed Weights   03/22/16 0452 03/23/16 0450 03/24/16 0511  Weight: 82.283 kg (181 lb 6.4 oz) 82.283 kg (181 lb 6.4 oz) 80.7  kg (177 lb 14.6 oz)    Examination:  General: A/O 4, positive Acute on chronic respiratory distress, raspy hoarse voice Eyes: negative scleral hemorrhage, negative anisocoria, negative icterus ENT: Negative Runny nose, negative gingival bleeding, Neck:  Negative scars, masses, torticollis, lymphadenopathy, JVD Lungs: poor air movement throughout all lung fields, positive expiratory wheezing (significantly improved), partially secondary to tracheal stenosis (bronchial wheezing), negative  Cardiovascular: Regular rhythm and rate without murmur gallop or rub normal S1 and S2 Abdomen: Morbidly obese, negative abdominal pain, nondistended, positive soft, bowel sounds, no rebound, no ascites, no appreciable mass Extremities: No significant cyanosis, clubbing, or edema bilateral lower extremities Skin: Negative rashes, lesions, ulcers Psychiatric:  Negative depression,Positive anxiety, negative fatigue, negative mania  Central nervous system:  Cranial nerves II through XII intact, tongue/uvula midline, all extremities muscle strength 5/5, sensation intact throughout,  negative dysarthria, negative expressive aphasia, negative receptive aphasia.  .     Data Reviewed: Care during the described time interval was provided by me .  I have reviewed this patient's available data, including medical history, events of note, physical examination, and all test results as part of my evaluation. I have personally reviewed and interpreted all radiology studies.  CBC:  Recent Labs Lab  2016-04-10 0945 03/19/16 0424 03/20/16 0510 03/21/16 0548  WBC 15.1* 15.3* 19.1* 14.8*  NEUTROABS 14.0*  --  18.2*  --   HGB 10.2* 8.8* 9.6* 9.6*  HCT 34.5* 31.0* 32.7* 31.9*  MCV 75.8* 74.5* 75.5* 75.6*  PLT 373 341 323 256   Basic Metabolic Panel:  Recent Labs Lab Apr 10, 2016 1753 03/19/16 0424 03/20/16 0510 03/21/16 0548 03/24/16 0643  NA 140 144 141 139 138  K 4.2 3.6 3.9 3.4* 4.3  CL 103 101 98* 93* 78*    CO2 30 34* 32 38* 44*  GLUCOSE 432* 150* 207* 253* 198*  BUN 23* 19 26* 28* 24*  CREATININE 0.87 0.78 0.76 0.81 0.65  CALCIUM 8.3* 9.1 9.1 8.9 9.0  MG  --   --  2.3  --   --    GFR: Estimated Creatinine Clearance: 77.2 mL/min (by C-G formula based on Cr of 0.65). Liver Function Tests:  Recent Labs Lab 2016/04/10 0945 04-10-2016 1753 03/19/16 0424  AST 18 12* 12*  ALT 12* 10* 10*  ALKPHOS 86 77 74  BILITOT 1.0 1.3* 1.0  PROT 6.6 5.9* 5.7*  ALBUMIN 3.5 3.2* 3.2*   No results for input(s): LIPASE, AMYLASE in the last 168 hours. No results for input(s): AMMONIA in the last 168 hours. Coagulation Profile:  Recent Labs Lab 2016-04-10 1753  INR 0.99   Cardiac Enzymes: No results for input(s): CKTOTAL, CKMB, CKMBINDEX, TROPONINI in the last 168 hours. BNP (last 3 results) No results for input(s): PROBNP in the last 8760 hours. HbA1C: No results for input(s): HGBA1C in the last 72 hours. CBG:  Recent Labs Lab 03/23/16 2257 03/24/16 0509 03/24/16 0628 03/24/16 0802 03/24/16 1135  GLUCAP 213* 53* 188* 231* 307*   Lipid Profile: No results for input(s): CHOL, HDL, LDLCALC, TRIG, CHOLHDL, LDLDIRECT in the last 72 hours. Thyroid Function Tests: No results for input(s): TSH, T4TOTAL, FREET4, T3FREE, THYROIDAB in the last 72 hours. Anemia Panel: No results for input(s): VITAMINB12, FOLATE, FERRITIN, TIBC, IRON, RETICCTPCT in the last 72 hours. Urine analysis:    Component Value Date/Time   COLORURINE YELLOW 04-10-2016 1050   APPEARANCEUR CLOUDY* 2016/04/10 1050   LABSPEC 1.019 04/10/2016 1050   PHURINE 6.0 April 10, 2016 1050   GLUCOSEU >1000* 04/10/16 1050   HGBUR SMALL* 10-Apr-2016 1050   BILIRUBINUR NEGATIVE 2016/04/10 1050   KETONESUR NEGATIVE Apr 10, 2016 1050   PROTEINUR NEGATIVE 04/10/16 1050   UROBILINOGEN 0.2 07/29/2015 1948   NITRITE NEGATIVE 2016-04-10 1050   LEUKOCYTESUR TRACE* 04-10-16 1050   Sepsis  Labs: (procalcitonin:4,lacticidven:4)  ) Recent Results (from the past 240 hour(s))  Blood Culture (routine x 2)     Status: None   Collection Time: April 10, 2016  9:45 AM  Result Value Ref Range Status   Specimen Description BLOOD LEFT ANTECUBITAL  Final   Special Requests BOTTLES DRAWN AEROBIC AND ANAEROBIC 5CC  Final   Culture NO GROWTH 5 DAYS  Final   Report Status 03/23/2016 FINAL  Final  Blood Culture (routine x 2)     Status: None   Collection Time: Apr 10, 2016 10:14 AM  Result Value Ref Range Status   Specimen Description BLOOD RIGHT ANTECUBITAL  Final   Special Requests BOTTLES DRAWN AEROBIC AND ANAEROBIC 5CC  Final   Culture NO GROWTH 5 DAYS  Final   Report Status 03/23/2016 FINAL  Final  Urine culture     Status: Abnormal   Collection Time: 2016-04-10 10:50 AM  Result Value Ref Range Status   Specimen Description URINE, RANDOM  Final   Special Requests NONE  Final   Culture MULTIPLE SPECIES PRESENT, SUGGEST RECOLLECTION (A)  Final   Report Status 03/19/2016 FINAL  Final  MRSA PCR Screening     Status: None   Collection Time: 16-Apr-2016  8:20 PM  Result Value Ref Range Status   MRSA by PCR NEGATIVE NEGATIVE Final    Comment:        The GeneXpert MRSA Assay (FDA approved for NASAL specimens only), is one component of a comprehensive MRSA colonization surveillance program. It is not intended to diagnose MRSA infection nor to guide or monitor treatment for MRSA infections.   Culture, Urine     Status: None   Collection Time: 03/19/16  8:52 PM  Result Value Ref Range Status   Specimen Description URINE, CATHETERIZED  Final   Special Requests NONE  Final   Culture NO GROWTH  Final   Report Status 03/21/2016 FINAL  Final         Radiology Studies: No results found.      Scheduled Meds: . ARIPiprazole  5 mg Oral Daily  . aspirin EC  81 mg Oral Daily  . dextromethorphan-guaiFENesin  1 tablet Oral BID  . enoxaparin (LOVENOX) injection  40 mg  Subcutaneous Q24H  . furosemide  80 mg Intravenous BID  . insulin aspart  0-20 Units Subcutaneous Q4H  . insulin glargine  28 Units Subcutaneous Daily  . ipratropium-albuterol  3 mL Nebulization QID  . levothyroxine  62.5 mcg Intravenous Daily  . methylPREDNISolone (SOLU-MEDROL) injection  60 mg Intravenous Q6H  . pantoprazole  40 mg Oral Daily  . potassium chloride SA  20 mEq Oral Daily  . simvastatin  10 mg Oral QPM  . sodium chloride flush  3 mL Intravenous Q12H  . valproate sodium  250 mg Intravenous Q12H   Continuous Infusions: . sodium chloride 10 mL/hr at 03/20/16 2300     LOS: 6 days    Time spent: 40 minutes    Nikhita Mentzel, Roselind Messier, MD [facility] Pager [phone]   If 7PM-7AM, please contact night-coverage [email or web address] Password [email or web address] 03/24/2016, 2:42 PM

## 2016-03-25 DIAGNOSIS — Z515 Encounter for palliative care: Secondary | ICD-10-CM | POA: Insufficient documentation

## 2016-03-25 LAB — OPIATES,MS,WB/SP RFX
6-Acetylmorphine: NEGATIVE
Codeine: NEGATIVE ng/mL
DIHYDROCODEINE: NEGATIVE ng/mL
HYDROCODONE: NEGATIVE ng/mL
Hydromorphone: NEGATIVE ng/mL
Morphine: NEGATIVE ng/mL
OPIATE CONFIRMATION: NEGATIVE

## 2016-03-25 LAB — GLUCOSE, CAPILLARY
GLUCOSE-CAPILLARY: 195 mg/dL — AB (ref 65–99)
GLUCOSE-CAPILLARY: 177 mg/dL — AB (ref 65–99)
GLUCOSE-CAPILLARY: 137 mg/dL — AB (ref 65–99)
GLUCOSE-CAPILLARY: 120 mg/dL — AB (ref 65–99)
GLUCOSE-CAPILLARY: 75 mg/dL (ref 65–99)
Glucose-Capillary: 219 mg/dL — ABNORMAL HIGH (ref 65–99)
Glucose-Capillary: 124 mg/dL — ABNORMAL HIGH (ref 65–99)

## 2016-03-25 MED ORDER — INSULIN ASPART 100 UNIT/ML ~~LOC~~ SOLN: 0.0000 [IU] | Freq: Every day | SUBCUTANEOUS | Status: DC

## 2016-03-25 MED ORDER — METHYLPREDNISOLONE SODIUM SUCC 125 MG IJ SOLR
60.0000 mg | Freq: Two times a day (BID) | INTRAMUSCULAR | Status: DC
  Administered 2016-03-26: 60 mg via INTRAVENOUS
  Filled 2016-03-25: qty 2

## 2016-03-25 MED ORDER — INSULIN ASPART 100 UNIT/ML ~~LOC~~ SOLN
0.0000 [IU] | Freq: Three times a day (TID) | SUBCUTANEOUS | Status: DC
  Administered 2016-03-25: 2 [IU] via SUBCUTANEOUS
  Administered 2016-03-26: 3 [IU] via SUBCUTANEOUS
  Administered 2016-03-26: 11 [IU] via SUBCUTANEOUS
  Administered 2016-03-27: 2 [IU] via SUBCUTANEOUS

## 2016-03-25 MED ORDER — FUROSEMIDE 10 MG/ML IJ SOLN
40.0000 mg | Freq: Two times a day (BID) | INTRAMUSCULAR | Status: DC
  Administered 2016-03-25: 40 mg via INTRAVENOUS
  Filled 2016-03-25: qty 4

## 2016-03-25 NOTE — Progress Notes (Signed)
CH visited with patient per referral of previous CH. The patient is having difficulty breathing/talking. The patient did express her fear of dying and wanting to be with her grandchildren. I offered the ministry of presence and prayer. I will follow up as needed.  Elizabeth SkeensBenjamin T Josy [name]    03/25/16 1300  Clinical Encounter Type  Visited With Patient  Visit Type Initial;Spiritual support;Patient actively dying  Referral From Chaplain  Spiritual Encounters  Spiritual Needs Emotional;Grief support

## 2016-03-25 NOTE — Clinical Social Work Note (Signed)
Patient transferred from 3S to 6N. Handoff information given to 6N CSW.   This CSW signing off.  Brees Hounshell, CSW [phone]  

## 2016-03-25 NOTE — Progress Notes (Addendum)
PROGRESS NOTE  Elizabeth BuryMary J [name]  WGN:[id number] DOB: September 29, 1960  DOA: 09-28-2015 PCP: Dorrene GermanEdwin A Avbuere, MD   Brief Narrative:  55 y.o. female with past medical history of HTN, anemia, depression, bipolar disorder, MI, DM 2, COPD with O2 dependency 3 L/m, chronic diastolic CHF, GERD, hypothryoidism, seizure and crack/cocaine drug abuse admitted on 09-28-2015 with acute on chronic respiratory failure r/t HCAP (recently discharged from hospital on 6/21 d/t CHF/COPO exacerbation). She has had 8 hospital admissions in the last 6 months. 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    Assessment & Plan:   Active Problems:   Diabetes mellitus, insulin dependent (IDDM), uncontrolled (HCC)   Depression   HTN (hypertension)   COPD (chronic obstructive pulmonary disease) (HCC)   Generalized anxiety disorder   Physical deconditioning   Hypothyroidism   GERD without esophagitis   Bipolar I disorder (HCC)   Chronic diastolic heart failure (HCC)   Anemia of chronic disease   Chronic respiratory failure (HCC)   Acute on chronic respiratory failure (HCC)   COPD exacerbation (HCC)   Hypotension due to drugs   HCAP (healthcare-associated pneumonia)   ARF (acute respiratory failure) (HCC)   Rheumatic mitral stenosis   Sepsis (HCC)   Sepsis, unspecified organism (HCC)   Acute on chronic respiratory failure with hypoxia (HCC)   Lung problems from crack cocaine (HCC)   Chronic diastolic CHF (congestive heart failure) (HCC)   Substance abuse   Uncontrolled type 2 diabetes mellitus with complication (HCC)   Acute dyspnea   Encounter for palliative care   Goals of care, counseling/discussion   Advance care planning   Palliative care encounter   End of life care   Acute on chronic respiratory failure with hypoxia - Multifactorial secondary to healthcare associated pneumonia, suspected aspiration, COPD exacerbation in the setting of substance  abuse complicating underlying severe emphysema with groundglass opacifications throughout all lung fields (crack lung), 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 However desaturates rapidly with activity. - Start tapering steroids. Continue bronchodilators, flutter valve. - Palliative care team follow-up appreciated. Discussed with Dr. Linna DarnerAnwar. Hospice, residential versus home discussed and agreed by patient and family.  Crack lung-substance abuse - UDS negative for cocaine this admission.  COPD exacerbation - Improving. Taper steroids.  Healthcare associated pneumonia versus aspiration - Suspicion for crack lung. Completed course of antibiotics.  Chronic diastolic CHF - TTE 12/26/15: LVEF 60-65 percent, severe mitral stenosis and grade 2 diastolic dysfunction.  - Currently on IV Lasix. -13.355 L since admission. Improving.  Essential hypertension - Controlled. Except Lasix, other than antihypertensives on hold due to intermittent hypotension.  DM 2, uncontrolled  - 4/20: A1c 7.5 suggesting poor control. Reasonable inpatient control on Lantus and SSI. May have to adjust insulin's last steroids decreased.  Bipolar disorder/anxiety - Stable. Continue Abilify and Depakote with when necessary meds.  Hypothyroid - Continue Synthroid.  Hyperlipidemia  GERD - Continue PPI.  Anemia of chronic disease - Stable.   DVT prophylaxis: Lovenox Code Status: DO NOT RESUSCITATE Family Communication: Discussed with patient. No family at bedside. Disposition Plan: To be decided-home versus residential hospice   Consultants:   Palliative care team  Procedures:   None  Antimicrobials:   Cefepime 6/26 > 7/1  Vancomycin 6/26 > 7/1   Subjective: Denies specific complaints. States that she feels "  so-so". No dyspnea or chest pain  reported.  Objective:  Filed Vitals:   03/25/16 0524 03/25/16 0916 03/25/16 1313 03/25/16 1559  BP: 130/55  97/50   Pulse: 77  81   Temp: 97.7 F (36.5 C)  97.8 F (36.6 C)   TempSrc: Oral  Oral   Resp:   13   Height:      Weight:      SpO2: 95% 93% 100% 98%    Intake/Output Summary (Last 24 hours) at 03/25/16 1622 Last data filed at 03/25/16 1406  Gross per 24 hour  Intake    903 ml  Output   2000 ml  Net  -1097 ml   Filed Weights   03/23/16 0450 03/24/16 0511 03/25/16 0500  Weight: 82.283 kg (181 lb 6.4 oz) 80.7 kg (177 lb 14.6 oz) 81.647 kg (180 lb)    Examination:  General exam: Pleasant middle-aged female sitting up comfortably in bed without distress. Respiratory system: Globally distant breath sounds but clear to auscultation. Respiratory effort normal. Cardiovascular system: S1 & S2 heard, RRR. No JVD, murmurs, rubs, gallops or clicks. Trace bilateral leg edema.  Gastrointestinal system: Abdomen is nondistended, soft and nontender. No organomegaly or masses felt. Normal bowel sounds heard. Central nervous system: Alert and oriented. No focal neurological deficits. Extremities: Symmetric 5 x 5 power. Skin: No rashes, lesions or ulcers Psychiatry: Judgement and insight appear normal. Mood & affect appropriate.     Data Reviewed: I have personally reviewed following labs and imaging studies  CBC:  Recent Labs Lab 03/19/16 0424 03/20/16 0510 03/21/16 0548  WBC 15.3* 19.1* 14.8*  NEUTROABS  --  18.2*  --   HGB 8.8* 9.6* 9.6*  HCT 31.0* 32.7* 31.9*  MCV 74.5* 75.5* 75.6*  PLT 341 323 256   Basic Metabolic Panel:  Recent Labs Lab 03/31/2016 1753 03/19/16 0424 03/20/16 0510 03/21/16 0548 03/24/16 0643  NA 140 144 141 139 138  K 4.2 3.6 3.9 3.4* 4.3  CL 103 101 98* 93* 78*  CO2 30 34* 32 38* 44*  GLUCOSE 432* 150* 207* 253* 198*  BUN 23* 19 26* 28* 24*  CREATININE 0.87 0.78 0.76 0.81 0.65  CALCIUM 8.3* 9.1 9.1 8.9 9.0  MG  --   --  2.3  --   --     GFR: Estimated Creatinine Clearance: 77.7 mL/min (by C-G formula based on Cr of 0.65). Liver Function Tests:  Recent Labs Lab Mar 28, 2016 1753 03/19/16 0424  AST 12* 12*  ALT 10* 10*  ALKPHOS 77 74  BILITOT 1.3* 1.0  PROT 5.9* 5.7*  ALBUMIN 3.2* 3.2*   No results for input(s): LIPASE, AMYLASE in the last 168 hours. No results for input(s): AMMONIA in the last 168 hours. Coagulation Profile:  Recent Labs Lab 03/23/2016 1753  INR 0.99   Cardiac Enzymes: No results for input(s): CKTOTAL, CKMB, CKMBINDEX, TROPONINI in the last 168 hours. BNP (last 3 results) No results for input(s): PROBNP in the last 8760 hours. HbA1C: No results for input(s): HGBA1C in the last 72 hours. CBG:  Recent Labs Lab 03/25/16 0106 03/25/16 0440 03/25/16 0817 03/25/16 1208 03/25/16 1559  GLUCAP 195* 219* 177* 137* 120*   Lipid Profile: No results for input(s): CHOL, HDL, LDLCALC, TRIG, CHOLHDL, LDLDIRECT in the last 72 hours. Thyroid Function Tests: No results for input(s): TSH, T4TOTAL, FREET4, T3FREE, THYROIDAB in the last 72 hours. Anemia Panel: No results for input(s): VITAMINB12, FOLATE, FERRITIN, TIBC, IRON, RETICCTPCT in the last 72 hours.  Sepsis Labs:  Recent Labs Lab 2016/05/11 1752 2016/05/11 1753 2016/05/11 2207  PROCALCITON  --  0.19  --   LATICACIDVEN 1.9  --  2.7*    Recent Results (from the past 240 hour(s))  Blood Culture (routine x 2)     Status: None   Collection Time: 2016/05/11  9:45 AM  Result Value Ref Range Status   Specimen Description BLOOD LEFT ANTECUBITAL  Final   Special Requests BOTTLES DRAWN AEROBIC AND ANAEROBIC 5CC  Final   Culture NO GROWTH 5 DAYS  Final   Report Status 03/23/2016 FINAL  Final  Blood Culture (routine x 2)     Status: None   Collection Time: 2016/05/11 10:14 AM  Result Value Ref Range Status   Specimen Description BLOOD RIGHT ANTECUBITAL  Final   Special Requests BOTTLES DRAWN AEROBIC AND ANAEROBIC 5CC  Final   Culture NO GROWTH 5  DAYS  Final   Report Status 03/23/2016 FINAL  Final  Urine culture     Status: Abnormal   Collection Time: 2016/05/11 10:50 AM  Result Value Ref Range Status   Specimen Description URINE, RANDOM  Final   Special Requests NONE  Final   Culture MULTIPLE SPECIES PRESENT, SUGGEST RECOLLECTION (A)  Final   Report Status 03/19/2016 FINAL  Final  MRSA PCR Screening     Status: None   Collection Time: 2016/05/11  8:20 PM  Result Value Ref Range Status   MRSA by PCR NEGATIVE NEGATIVE Final    Comment:        The GeneXpert MRSA Assay (FDA approved for NASAL specimens only), is one component of a comprehensive MRSA colonization surveillance program. It is not intended to diagnose MRSA infection nor to guide or monitor treatment for MRSA infections.   Culture, Urine     Status: None   Collection Time: 03/19/16  8:52 PM  Result Value Ref Range Status   Specimen Description URINE, CATHETERIZED  Final   Special Requests NONE  Final   Culture NO GROWTH  Final   Report Status 03/21/2016 FINAL  Final         Radiology Studies: No results found.      Scheduled Meds: . ARIPiprazole  5 mg Oral Daily  . aspirin EC  81 mg Oral Daily  . dextromethorphan-guaiFENesin  1 tablet Oral BID  . enoxaparin (LOVENOX) injection  40 mg Subcutaneous Q24H  . furosemide  80 mg Intravenous BID  . insulin aspart  0-20 Units Subcutaneous Q4H  . insulin glargine  28 Units Subcutaneous Daily  . ipratropium-albuterol  3 mL Nebulization QID  . levothyroxine  125 mcg Oral QAC breakfast  . methylPREDNISolone (SOLU-MEDROL) injection  60 mg Intravenous Q6H  . pantoprazole  40 mg Oral Daily  . potassium chloride SA  20 mEq Oral Daily  . simvastatin  10 mg Oral QPM  . sodium chloride flush  3 mL Intravenous Q12H  . valproate sodium  250 mg Intravenous Q12H   Continuous Infusions: . sodium chloride 10 mL/hr at 03/24/16 2057     LOS: 7 days    Time spent: 30 minutes.    [facility] Medical CenterNGALGI,Emeka Lindner, MD [facility] Pager [phone]  If 7PM-7AM, please contact night-coverage [email or web address] Password [email or web address] 03/25/2016, 4:22 PM

## 2016-03-25 NOTE — Progress Notes (Signed)
Nutrition Brief Note  Chart reviewed. Pt now transitioning to comfort care.  No further nutrition interventions warranted at this time.  Please re-consult as needed.   Kord Monette A. Dorrien Grunder, RD, LDN, CDE Pager: [phone] After hours Pager: [phone]  

## 2016-03-25 NOTE — Progress Notes (Addendum)
Daily Progress Note   Patient Name: [name]       Date: 03/25/2016 DOB: [birth year]  Age: 55 y.o. MRN#: [id number] Attending Physician: Elease Etienne, MD Primary Care Physician: [name], MD Admit Date: 03/22/2016  Reason for Consultation/Follow-up: Establishing goals of care and Non pain symptom management  Subjective:  HPI: 55 y.o. female with past medical history of HTN, anemia, depression, bipolar disorder, MI, COPD with O2 dependency, CHF, GERD, hypothryoidism and crack/cocaine drug abuse admitted on 03/17/2016 with acute on chronic respiratory failure r/t HCAP (recently discharged from hospital on 6/21 d/t CHF/COPO exacerbation). She has had 8 hospital admissions in the last 6 months. 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      Patient resting in bed. Visibly and audibly with dyspnea. States she is hot. Discussed with patient that a representative from Hospice is going to visit her to discuss care. She became upset, repeating, "I'm dying, I'm dying". Also, stating she was hot. We provided comfort and support.  Length of Stay: 7  Current Medications: Scheduled Meds:  . ARIPiprazole  5 mg Oral Daily  . aspirin EC  81 mg Oral Daily  . dextromethorphan-guaiFENesin  1 tablet Oral BID  . enoxaparin (LOVENOX) injection  40 mg Subcutaneous Q24H  . furosemide  80 mg Intravenous BID  . insulin aspart  0-20 Units Subcutaneous Q4H  . insulin glargine  28 Units Subcutaneous Daily  . ipratropium-albuterol  3 mL Nebulization QID  . levothyroxine  125 mcg Oral QAC breakfast  . methylPREDNISolone (SOLU-MEDROL) injection  60 mg Intravenous Q6H  . pantoprazole  40 mg Oral Daily  . potassium chloride SA  20 mEq Oral Daily  .  simvastatin  10 mg Oral QPM  . sodium chloride flush  3 mL Intravenous Q12H  . valproate sodium  250 mg Intravenous Q12H    Continuous Infusions: . sodium chloride 10 mL/hr at 03/24/16 2057    PRN Meds: bisacodyl, HYDROcodone-acetaminophen, ipratropium-albuterol, LORazepam, magnesium citrate, morphine injection, ondansetron **OR** ondansetron (ZOFRAN) IV, senna-docusate, traZODone  Physical Exam  Constitutional: [name] weak, pale appearing. She appears distressed.  HENT:  Head: Normocephalic and atraumatic.  Eyes: EOM are normal.  Cardiovascular: Normal rate and regular rhythm.   Pulmonary/Chest: She has wheezes.  Audible wheezing,  increased effort   Skin: Skin is warm and dry.  Psychiatric:  Anxious,             Vital Signs: BP 130/55 mmHg  Pulse 77  Temp(Src) 97.7 F (36.5 C) (Oral)  Resp 14  Ht 5\' 2"  (1.575 m)  Wt 81.647 kg (180 lb)  BMI 32.91 kg/m2  SpO2 93% SpO2: SpO2: 93 % O2 Device: O2 Device: Nasal Cannula O2 Flow Rate: O2 Flow Rate (L/min): 2 L/min  Intake/output summary:   Intake/Output Summary (Last 24 hours) at 03/25/16 0940 Last data filed at 03/25/16 [id number]  Gross per 24 hour  Intake  465.5 ml  Output   1950 ml  Net -1484.5 ml   LBM: Last BM Date: 03/24/16 Baseline Weight: Weight: 86.1 kg (189 lb 13.1 oz) Most recent weight: Weight: 81.647 kg (180 lb)       Palliative Assessment/Data: PPS: 20%   Flowsheet Rows        Most Recent Value   Intake Tab    Referral Department  Hospitalist   Unit at Time of Referral  ICU   Palliative Care Primary Diagnosis  Pulmonary   Date Notified  03/19/16   Palliative Care Type  New Palliative care   Reason for referral  Clarify Goals of Care   Date of Admission  03/04/2016   Date first seen by Palliative Care  03/20/16   # of days Palliative referral response time  1 Day(s)   # of days IP prior to Palliative referral  1   Clinical Assessment    Palliative Performance Scale Score  30%   Psychosocial &  Spiritual Assessment    Palliative Care Outcomes    Patient/Family meeting held?  Yes      Patient Active Problem List   Diagnosis Date Noted  . Advance care planning   . Palliative care encounter   . Acute dyspnea   . Encounter for palliative care   . Goals of care, counseling/discussion   . Acute on chronic respiratory failure with hypoxia (HCC)   . Lung problems from crack cocaine (HCC)   . Chronic diastolic CHF (congestive heart failure) (HCC)   . Substance abuse   . Uncontrolled type 2 diabetes mellitus with complication (HCC)   . Sepsis (HCC) 02/25/2016  . Sepsis, unspecified organism (HCC)   . COPD with exacerbation (HCC) 03/10/2016  . Insulin dependent diabetes mellitus (HCC)   . Dyslipidemia 03/09/2016  . Acute CHF (congestive heart failure) (HCC) 03/02/2016  . Anemia 03/02/2016  . Respiratory failure (HCC) 01/14/2016  . Hyperlipidemia 01/14/2016  . Rheumatic mitral stenosis 01/14/2016  . Acute respiratory failure with hypoxia (HCC)   . Urinary retention with incomplete bladder emptying 01/11/2016  . ARF (acute respiratory failure) (HCC) 01/11/2016  . Seizures (HCC) 01/11/2016  . Essential hypertension   . Anxiety state   . Bipolar affective disorder in remission (HCC)   . Debility 01/09/2016  . Dysphagia 01/09/2016  . ARDS (adult respiratory distress syndrome) (HCC)   . Acute encephalopathy   . HCAP (healthcare-associated pneumonia)   . Acute on chronic respiratory failure (HCC) 12/25/2015  . COPD exacerbation (HCC) 12/25/2015  . Acute on chronic congestive heart failure (HCC)   . Bilateral edema of lower extremity   . Acute respiratory failure with hypoxemia (HCC)   . Hypotension due to drugs   . Chronic respiratory failure (HCC) 12/24/2015  . Acute on chronic diastolic CHF (congestive heart failure), NYHA class 1 (HCC) 12/23/2015  . Anemia  of chronic disease 12/23/2015  . Cellulitis of leg, left 12/23/2015  . Chronic diastolic heart failure (HCC)  91/47/829501/20/2017  . DM type 2, uncontrolled, with renal complications (HCC) 10/13/2015  . Bipolar I disorder (HCC)   . Agoraphobia   . Physical deconditioning 04/21/2015  . Hypothyroidism 04/21/2015  . GERD without esophagitis 04/21/2015  . Generalized anxiety disorder 04/12/2015  . Moderate mitral stenosis 10/06/2013  . COPD (chronic obstructive pulmonary disease) (HCC) 10/04/2011  . Tobacco abuse 10/04/2011  . Diabetes mellitus, insulin dependent (IDDM), uncontrolled (HCC) 07/29/2011  . Depression 07/29/2011  . HTN (hypertension) 07/29/2011    Palliative Care Assessment & Plan   Patient Profile:  Assessment/Recommendations/Plan:  Shortness of breath:  continue morphine 2 mg q2hr prn SOB- she is requiring about 10mg  q 24 hr at this point, will order fan at bedside, keep thermometer on cold  Anxiety: continue lorazepam 1 mg q2hr prn anxiety and SOB  Advanced Care planning: DNR, discussed Hospice Care with patient, referral for Hospice and residential placement made   Goals of care: comfort care, PMT will continue to assist with symptom management  Goals of Care and Additional Recommendations:  Limitations on Scope of Treatment: Avoid Hospitalization, Full Comfort Care, Minimize Medications, No Artificial Feeding, No Blood Transfusions, No Diagnostics, No Hemodialysis and No Tracheostomy  Code Status:    Code Status Orders        Start     Ordered   03/21/16 1119  Do not attempt resuscitation (DNR)   Continuous    Question Answer Comment  In the event of cardiac or respiratory ARREST Do not call a "code blue"   In the event of cardiac or respiratory ARREST Do not perform Intubation, CPR, defibrillation or ACLS   In the event of cardiac or respiratory ARREST Use medication by any route, position, wound care, and other measures to relive pain and suffering. May use oxygen, suction and manual treatment of airway obstruction as needed for comfort.      03/21/16 1118    Code  Status History    Date Active Date Inactive Code Status Order ID Comments User Context   03/20/2016 11:12 AM 03/21/2016 11:18 AM Full Code [id number]  Barbara CowerKasie J Mahan, NP Inpatient   03/20/2016  8:57 AM 03/20/2016 11:12 AM DNR [id number]  Barbara CowerKasie J Mahan, NP Inpatient   02/27/2016  6:16 PM 03/20/2016  8:57 AM Full Code [id number]  Marcos EkeSara E Wertman, PA-C Inpatient   03/06/2016  6:16 PM 03/07/2016  6:16 PM Partial Code [id number]  Marcos EkeSara E Wertman, PA-C Inpatient   02/25/2016  5:35 PM 03/17/2016  6:16 PM Partial Code [id number]  Marcos EkeSara E Wertman, PA-C Inpatient   03/07/2016  2:05 PM 03/16/2016  5:35 PM Full Code [id number]  Marcos EkeSara E Wertman, PA-C ED   03/10/2016 12:17 AM 03/13/2016  3:06 PM Full Code [id number]  Briscoe Deutscherimothy S Opyd, MD ED   03/02/2016  6:31 AM 03/08/2016  6:40 PM Full Code [id number]  Haydee SalterPhillip M Hobbs, MD Inpatient   01/11/2016  5:18 PM 01/26/2016  5:57 PM Full Code [id number]  Ozella Rocksavid J Merrell, MD Inpatient   01/09/2016  3:07 PM 01/11/2016  5:18 PM Full Code [id number]  Jacquelynn CreePamela S Love, PA-C Inpatient   01/09/2016  3:07 PM 01/09/2016  3:07 PM Full Code [id number]  Jacquelynn CreePamela S Love, PA-C Inpatient   12/23/2015  3:31 AM 01/09/2016  3:07 PM Full Code [id number]  Alberteen Samhristopher P Danford, MD Inpatient   10/16/2015  9:42 PM 10/17/2015  4:30 PM Full  Code [id number]  Leroy Sea, MD ED   10/12/2015 11:07 PM 10/14/2015  2:01 PM Full Code [id number]  Cammy Copa, MD Inpatient   07/29/2015  4:52 PM 07/31/2015  6:39 PM Full Code [id number]  Stephani Police, PA-C Inpatient   04/12/2015  9:11 PM 04/18/2015  6:32 PM Full Code [id number]  Stephani Police, PA-C Inpatient   08/28/2013  4:44 AM 08/30/2013  6:05 PM Full Code [id number]  Therisa Doyne, MD Inpatient   07/06/2012  3:30 PM 07/07/2012  7:57 PM Full Code [id number]  Wynetta Emery, PA-C ED   10/04/2011  7:24 AM 10/07/2011  5:45 PM Full Code [id number]  Norina Buzzard, RN ED   08/11/2011  1:11 PM 08/19/2011  6:19 PM Full Code [id number]  Standley Brooking, MD Inpatient   08/09/2011 10:43 PM 08/11/2011   1:11 PM Full Code [id number]  Rudene Christians, RN Inpatient   08/05/2011  1:16 PM 08/09/2011 10:43 PM Full Code [id number]  Conley Canal, MD Inpatient   07/29/2011  2:11 AM 07/30/2011 12:59 PM Full Code [id number]  Vida Roller, MD ED   07/28/2011  9:53 PM 07/28/2011 11:27 PM Full Code [id number]  Ahmed Prima, RN ED       Prognosis:   weeks due to chronic advanced lung disease, decreased functional status, and PPS 20%  Discharge Planning:  Recommend residential hospice     Care plan was discussed with Patient  Thank you for allowing the Palliative Medicine Team to assist in the care of this patient.   Time In: 0920 Time Out: 0945 Total Time 25 Prolonged Time Billed  No       Greater than 50%  of this time was spent counseling and coordinating care related to the above assessment and plan.  Ocie Bob, AGNP-C [facility] Palliative Medicine Team [phone] office [phone]  pager   Please contact Palliative Medicine Team phone at [phone] for questions and concerns.   Addendum: Agree with Ms Mahan's note Patient seen and examined this am Residential hospice  Rosalin Hawking MD [facility] palliative medicine [phone]

## 2016-03-26 DIAGNOSIS — E873 Alkalosis: Secondary | ICD-10-CM | POA: Insufficient documentation

## 2016-03-26 LAB — DRUG SCREEN 10 W/CONF, SERUM
Amphetamines, IA: NEGATIVE ng/mL
BARBITURATES, IA: NEGATIVE ug/mL
BENZODIAZEPINES, IA: POSITIVE ng/mL
COCAINE & METABOLITE, IA: NEGATIVE ng/mL
Methadone, IA: NEGATIVE ng/mL
OPIATES, IA: NEGATIVE ng/mL
Oxycodones, IA: NEGATIVE ng/mL
Phencyclidine, IA: NEGATIVE ng/mL
Propoxyphene, IA: NEGATIVE ng/mL
THC(MARIJUANA) METABOLITE, IA: NEGATIVE ng/mL

## 2016-03-26 LAB — BENZODIAZEPINES,MS,WB/SP RFX
7-AMINOCLONAZEPAM: NEGATIVE ng/mL
Alprazolam: 4.7 ng/mL
BENZODIAZEPINES CONFIRM: POSITIVE
CHLORDIAZEPOXIDE: NEGATIVE ng/mL
Clonazepam: NEGATIVE ng/mL
DESMETHYLDIAZEPAM: NEGATIVE ng/mL
DIAZEPAM: NEGATIVE ng/mL
Desalkylflurazepam: NEGATIVE ng/mL
Desmethylchlordiazepoxide: NEGATIVE ng/mL
Flurazepam: NEGATIVE ng/mL
Lorazepam: 32.4 ng/mL
Midazolam: NEGATIVE ng/mL
Oxazepam: NEGATIVE ng/mL
TEMAZEPAM: NEGATIVE ng/mL
Triazolam: NEGATIVE ng/mL

## 2016-03-26 LAB — BASIC METABOLIC PANEL
BUN: 22 mg/dL — AB (ref 6–20)
CO2: >50 mmol/L — ABNORMAL HIGH (ref 22–32)
CREATININE: 0.67 mg/dL (ref 0.44–1.00)
Calcium: 8.9 mg/dL (ref 8.9–10.3)
Chloride: 75 mmol/L — ABNORMAL LOW (ref 101–111)
GFR calc Af Amer: >60 mL/min (ref >60)
GLUCOSE: 118 mg/dL — AB (ref 65–99)
Potassium: 3.5 mmol/L (ref 3.5–5.1)
SODIUM: 135 mmol/L (ref 135–145)

## 2016-03-26 LAB — GLUCOSE, CAPILLARY
GLUCOSE-CAPILLARY: 189 mg/dL — AB (ref 65–99)
GLUCOSE-CAPILLARY: 87 mg/dL (ref 65–99)
Glucose-Capillary: 301 mg/dL — ABNORMAL HIGH (ref 65–99)
Glucose-Capillary: 97 mg/dL (ref 65–99)

## 2016-03-26 MED ORDER — MORPHINE SULFATE (PF) 2 MG/ML IV SOLN
2.0000 mg | INTRAVENOUS | Status: DC | PRN
  Administered 2016-03-26 – 2016-03-27 (×2): 2 mg via INTRAVENOUS
  Filled 2016-03-26 (×3): qty 1

## 2016-03-26 MED ORDER — MORPHINE SULFATE (PF) 2 MG/ML IV SOLN
2.0000 mg | INTRAVENOUS | Status: DC
  Administered 2016-03-26 – 2016-03-27 (×7): 2 mg via INTRAVENOUS
  Filled 2016-03-26 (×5): qty 1

## 2016-03-26 MED ORDER — POTASSIUM CHLORIDE CRYS ER 20 MEQ PO TBCR
40.0000 meq | EXTENDED_RELEASE_TABLET | Freq: Two times a day (BID) | ORAL | Status: AC
  Administered 2016-03-26 (×2): 40 meq via ORAL
  Filled 2016-03-26 (×2): qty 2

## 2016-03-26 MED ORDER — PREDNISONE 20 MG PO TABS
40.0000 mg | ORAL_TABLET | Freq: Every day | ORAL | Status: DC
  Administered 2016-03-27: 40 mg via ORAL
  Filled 2016-03-26: qty 2

## 2016-03-26 MED ORDER — POTASSIUM CHLORIDE CRYS ER 20 MEQ PO TBCR
20.0000 meq | EXTENDED_RELEASE_TABLET | Freq: Every day | ORAL | Status: DC
  Administered 2016-03-27: 20 meq via ORAL
  Filled 2016-03-26: qty 1

## 2016-03-26 NOTE — Progress Notes (Signed)
Daily Progress Note   Patient Name: [name]       Date: 03/26/2016 DOB: [birth year]  Age: 55 y.o. MRN#: [id number] Attending Physician: Elease EtienneAnand D Hongalgi, MD Primary Care Physician: Dorrene GermanEdwin A Avbuere, MD Admit Date: 02/22/2016  Reason for Consultation/Follow-up: Establishing goals of care and Non pain symptom management  Subjective:  HPI: 55 y.o. female with past medical history of HTN, anemia, depression, bipolar disorder, MI, COPD with O2 dependency, CHF, GERD, hypothryoidism and crack/cocaine drug abuse admitted on 03/17/2016 with acute on chronic respiratory failure r/t HCAP (recently discharged from hospital on 6/21 d/t CHF/COPO exacerbation). She has had 8 hospital admissions in the last 6 months. 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      She was lying in bed today during examination. [name] some distress from increased respiratory effort with audible wheezing. Respiratory therapy did arrive at end of the encounter to provide breathing treatment. She reports that she is still having shortness of breath. She does not remember to ask for morphine for shortness of breath, but feels it is helpful whenever she sees it. We discussed option of scheduling medication while also increasing frequency of as needed medication. She believes this would be beneficial to her and states this would probably help with her anxiety as she is worried she is not communicative medication when she needs it.  We also discussed options for discharge from the hospital. We talked about [facility]. She reports being agreeable to this, but states that she would need to talk further with her husband prior to making final decision. She states that she will do  this when he rides the hospital today. I called and spoke with social work who reports she is waiting on facility choice from patient. She will follow-up later today to confirm patient's choice of residential hospice facility and make appropriate referral at that time.  Length of Stay: 8  Current Medications: Scheduled Meds:  . ARIPiprazole  5 mg Oral Daily  . aspirin EC  81 mg Oral Daily  . dextromethorphan-guaiFENesin  1 tablet Oral BID  . enoxaparin (LOVENOX) injection  40 mg Subcutaneous Q24H  . insulin aspart  0-15 Units Subcutaneous TID WC  . insulin aspart  0-5 Units Subcutaneous QHS  . insulin glargine  28 Units Subcutaneous Daily  . ipratropium-albuterol  3 mL Nebulization QID  . levothyroxine  125 mcg Oral QAC breakfast  . methylPREDNISolone (SOLU-MEDROL) injection  60 mg Intravenous Q12H  .  morphine injection  2 mg Intravenous Q4H  . pantoprazole  40 mg Oral Daily  . [START ON 03/27/2016] potassium chloride SA  20 mEq Oral Daily  . potassium chloride  40 mEq Oral BID  . simvastatin  10 mg Oral QPM  . sodium chloride flush  3 mL Intravenous Q12H  . valproate sodium  250 mg Intravenous Q12H    Continuous Infusions: . sodium chloride 10 mL/hr at 03/24/16 2057    PRN Meds: bisacodyl, HYDROcodone-acetaminophen, ipratropium-albuterol, LORazepam, magnesium citrate, morphine injection, ondansetron **OR** ondansetron (ZOFRAN) IV, senna-docusate, traZODone  Physical Exam  Constitutional: [name] weak, pale appearing. She appears distressed.  HENT:  Head: Normocephalic and atraumatic.  Eyes: EOM are normal.  Cardiovascular: Normal rate and regular rhythm.   Pulmonary/Chest: She has wheezes.  Audible wheezing, increased effort   Skin: Skin is warm and dry.  Psychiatric:  Anxious,             Vital Signs: BP 118/74 mmHg  Pulse 81  Temp(Src) 98.3 F (36.8 C) (Oral)  Resp 16  Ht 5\' 2"  (1.575 m)  Wt 79.969 kg (176 lb 4.8 oz)  BMI 32.24 kg/m2  SpO2 98% SpO2: SpO2: 98  % O2 Device: O2 Device: Nasal Cannula O2 Flow Rate: O2 Flow Rate (L/min): 2 L/min  Intake/output summary:   Intake/Output Summary (Last 24 hours) at 03/26/16 1240 Last data filed at 03/26/16 0458  Gross per 24 hour  Intake   1340 ml  Output   1650 ml  Net   -310 ml   LBM: Last BM Date: 03/24/16 Baseline Weight: Weight: 86.1 kg (189 lb 13.1 oz) Most recent weight: Weight: 79.969 kg (176 lb 4.8 oz)       Palliative Assessment/Data: PPS: 20%   Flowsheet Rows        Most Recent Value   Intake Tab    Referral Department  Hospitalist   Unit at Time of Referral  ICU   Palliative Care Primary Diagnosis  Pulmonary   Date Notified  03/19/16   Palliative Care Type  New Palliative care   Reason for referral  Clarify Goals of Care   Date of Admission  03/26/2016   Date first seen by Palliative Care  03/20/16   # of days Palliative referral response time  1 Day(s)   # of days IP prior to Palliative referral  1   Clinical Assessment    Palliative Performance Scale Score  30%   Psychosocial & Spiritual Assessment    Palliative Care Outcomes    Patient/Family meeting held?  Yes      Patient Active Problem List   Diagnosis Date Noted  . End of life care   . Advance care planning   . Palliative care encounter   . Acute dyspnea   . Encounter for palliative care   . Goals of care, counseling/discussion   . Acute on chronic respiratory failure with hypoxia (HCC)   . Lung problems from crack cocaine (HCC)   . Chronic diastolic CHF (congestive heart failure) (HCC)   . Substance abuse   . Uncontrolled type 2 diabetes mellitus with complication (HCC)   . Sepsis (HCC) 26-Mar-2016  . Sepsis, unspecified organism (HCC)   . COPD with exacerbation (HCC) 03/10/2016  . Insulin dependent diabetes mellitus (HCC)   .  Dyslipidemia 03/09/2016  . Acute CHF (congestive heart failure) (HCC) 03/02/2016  . Anemia 03/02/2016  . Respiratory failure (HCC) 01/14/2016  . Hyperlipidemia 01/14/2016  .  Rheumatic mitral stenosis 01/14/2016  . Acute respiratory failure with hypoxia (HCC)   . Urinary retention with incomplete bladder emptying 01/11/2016  . ARF (acute respiratory failure) (HCC) 01/11/2016  . Seizures (HCC) 01/11/2016  . Essential hypertension   . Anxiety state   . Bipolar affective disorder in remission (HCC)   . Debility 01/09/2016  . Dysphagia 01/09/2016  . ARDS (adult respiratory distress syndrome) (HCC)   . Acute encephalopathy   . HCAP (healthcare-associated pneumonia)   . Acute on chronic respiratory failure (HCC) 12/25/2015  . COPD exacerbation (HCC) 12/25/2015  . Acute on chronic congestive heart failure (HCC)   . Bilateral edema of lower extremity   . Acute respiratory failure with hypoxemia (HCC)   . Hypotension due to drugs   . Chronic respiratory failure (HCC) 12/24/2015  . Acute on chronic diastolic CHF (congestive heart failure), NYHA class 1 (HCC) 12/23/2015  . Anemia of chronic disease 12/23/2015  . Cellulitis of leg, left 12/23/2015  . Chronic diastolic heart failure (HCC) 10/13/2015  . DM type 2, uncontrolled, with renal complications (HCC) 10/13/2015  . Bipolar I disorder (HCC)   . Agoraphobia   . Physical deconditioning 04/21/2015  . Hypothyroidism 04/21/2015  . GERD without esophagitis 04/21/2015  . Generalized anxiety disorder 04/12/2015  . Moderate mitral stenosis 10/06/2013  . COPD (chronic obstructive pulmonary disease) (HCC) 10/04/2011  . Tobacco abuse 10/04/2011  . Diabetes mellitus, insulin dependent (IDDM), uncontrolled (HCC) 07/29/2011  . Depression 07/29/2011  . HTN (hypertension) 07/29/2011    Palliative Care Assessment & Plan   Patient Profile:  Assessment/Recommendations/Plan:  Shortness of breath:  She has not received a dose of morphine for last several hours.  She does have some increased work of breathing noted on examination. She reports that she is short of breath does not remember to ask for morphine. I talked with  her about scheduling a dose every 4 hours and she thinks this would be helpful to her. Additionally, she reports when her shortness of breath gets bad that the medication does not last long enough and she starts to feel short of breath again about an hour after taking a rescue dose. We'll therefore increase frequency of when necessary dose to every hour as needed.  Will order fan at bedside, keep thermostat on cold.  Anxiety: continue lorazepam 1 mg q2hr prn anxiety and SOB  Advanced Care planning: DNR, discussed Hospice Care with patient, referral for Hospice and residential placement.  I discussed [facility] again with her today. She told social work yesterday and that she needed to confirm this with her husband prior to making choice. She states that she has not yet done this as of my meeting with her this morning. She said her husband is coming to the hospital and she will confirm with him. I talked with her social worker and she will follow up with her later today and make referral for patient's preferred residential hospice (likely [facility] based on my conversation with patient this AM).   Goals of care: comfort care, PMT will continue to assist with symptom management  Goals of Care and Additional Recommendations:  Limitations on Scope of Treatment: Avoid Hospitalization, Full Comfort Care, Minimize Medications, No Artificial Feeding, No Blood Transfusions, No Diagnostics, No Hemodialysis and No Tracheostomy  Code Status:    Code Status Orders  Start     Ordered   03/21/16 1119  Do not attempt resuscitation (DNR)   Continuous    Question Answer Comment  In the event of cardiac or respiratory ARREST Do not call a "code blue"   In the event of cardiac or respiratory ARREST Do not perform Intubation, CPR, defibrillation or ACLS   In the event of cardiac or respiratory ARREST Use medication by any route, position, wound care, and other measures to relive pain and suffering. May  use oxygen, suction and manual treatment of airway obstruction as needed for comfort.      03/21/16 1118    Code Status History    Date Active Date Inactive Code Status Order ID Comments User Context   03/20/2016 11:12 AM 03/21/2016 11:18 AM Full Code [id number]  Barbara CowerKasie J Mahan, NP Inpatient   03/20/2016  8:57 AM 03/20/2016 11:12 AM DNR [id number]  Barbara CowerKasie J Mahan, NP Inpatient   03/16/2016  6:16 PM 03/20/2016  8:57 AM Full Code [id number]  Marcos EkeSara E Wertman, PA-C Inpatient   03/15/2016  6:16 PM 03/07/2016  6:16 PM Partial Code [id number]  Marcos EkeSara E Wertman, PA-C Inpatient   03/05/2016  5:35 PM 03/08/2016  6:16 PM Partial Code [id number]  Marcos EkeSara E Wertman, PA-C Inpatient   03/22/2016  2:05 PM 03/13/2016  5:35 PM Full Code [id number]  Marcos EkeSara E Wertman, PA-C ED   03/10/2016 12:17 AM 03/13/2016  3:06 PM Full Code [id number]  Briscoe Deutscherimothy S Opyd, MD ED   03/02/2016  6:31 AM 03/08/2016  6:40 PM Full Code [id number]  Haydee SalterPhillip M Hobbs, MD Inpatient   01/11/2016  5:18 PM 01/26/2016  5:57 PM Full Code [id number]  Ozella Rocksavid J Merrell, MD Inpatient   01/09/2016  3:07 PM 01/11/2016  5:18 PM Full Code [id number]  Jacquelynn CreePamela S Love, PA-C Inpatient   01/09/2016  3:07 PM 01/09/2016  3:07 PM Full Code [id number]  Jacquelynn CreePamela S Love, PA-C Inpatient   12/23/2015  3:31 AM 01/09/2016  3:07 PM Full Code [id number]  Alberteen Samhristopher P Danford, MD Inpatient   10/16/2015  9:42 PM 10/17/2015  4:30 PM Full Code [id number]  Leroy SeaPrashant K Singh, MD ED   10/12/2015 11:07 PM 10/14/2015  2:01 PM Full Code [id number]  Cammy CopaScott Gregory Dean, MD Inpatient   07/29/2015  4:52 PM 07/31/2015  6:39 PM Full Code [id number]  Stephani PoliceMarianne L York, PA-C Inpatient   04/12/2015  9:11 PM 04/18/2015  6:32 PM Full Code [id number]  Stephani PoliceMarianne L York, PA-C Inpatient   08/28/2013  4:44 AM 08/30/2013  6:05 PM Full Code [id number]  Therisa DoyneAnastassia Doutova, MD Inpatient   07/06/2012  3:30 PM 07/07/2012  7:57 PM Full Code [id number]  Wynetta EmeryNicole Pisciotta, PA-C ED   10/04/2011  7:24 AM 10/07/2011  5:45 PM Full Code [id number]  Norina BuzzardAshley Lavendra Kee, RN ED    08/11/2011  1:11 PM 08/19/2011  6:19 PM Full Code [id number]  Standley Brookinganiel P Goodrich, MD Inpatient   08/09/2011 10:43 PM 08/11/2011  1:11 PM Full Code [id number]  Rudene Christiansharity A Ingram, RN Inpatient   08/05/2011  1:16 PM 08/09/2011 10:43 PM Full Code [id number]  Conley CanalSimbiso Ranga, MD Inpatient   07/29/2011  2:11 AM 07/30/2011 12:59 PM Full Code [id number]  Vida RollerBrian D Miller, MD ED   07/28/2011  9:53 PM 07/28/2011 11:27 PM Full Code [id number]  Ahmed Primaachel Gayle Hayden, RN ED       Prognosis:   Patient with chronic advanced lung disease with significantly decreased functional status (PPS 20%). [name] not taking in significant nutrition nor  hydration.  Symptom management needs continue to increase. I believe her prognosis with limited to matter of weeks at best and she is at high risk of acute decompensation/death with need for emergent symptom management due to the fact that she has known tracheal stenosis wheezing noted on exam today.    Discharge Planning:  Recommend residential hospice     Care plan was discussed with Patient, SW, and bedside RN  Thank you for allowing the Palliative Medicine Team to assist in the care of this patient.   Time In: 1105 Time Out: 1145 Total Time 40 Prolonged Time Billed  No       Greater than 50%  of this time was spent counseling and coordinating care related to the above assessment and plan.    Please contact Palliative Medicine Team phone at [phone] for questions and concerns.   Romie Minus, MD [facility] Palliative Medicine Team [phone]

## 2016-03-26 NOTE — Clinical Social Work Note (Addendum)
CSW spoke with patient. She reported her support is Grandville SilosJames Weeks and she would like him to help her make decision regarding going to residential hospice. CSW called Grandville SilosJames Weeks he reported he has no transportation and would not be able to visit patient if [place]. CSW explained residential hospice search and answered his questions. Grandville SilosJames Weeks reported he would like patient to be place with [facility]. [facility].  1:55PM CSW received a call from [place] with [facility]. She reported she would call patient's support Fayrene FearingJames Week to complete paperwork for [facility].   [employer] EnergyShonny Dagoberto Nealy, LCSW [phone]

## 2016-03-26 NOTE — Progress Notes (Signed)
PROGRESS NOTE  Elizabeth BuryMary J [name]  ZOX:[id number] DOB: 03-02-1961  DOA: 21-Dec-2015 PCP: Dorrene GermanEdwin A Avbuere, MD   Brief Narrative:  55 y.o. female with past medical history of HTN, anemia, depression, bipolar disorder, MI, DM 2, COPD with O2 dependency 3 L/m, chronic diastolic CHF, GERD, hypothryoidism, seizure and crack/cocaine drug abuse admitted on 21-Dec-2015 with acute on chronic respiratory failure r/t HCAP (recently discharged from hospital on 6/21 d/t CHF/COPO exacerbation). She has had 8 hospital admissions in the last 6 months. 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    Assessment & Plan:   Active Problems:   Diabetes mellitus, insulin dependent (IDDM), uncontrolled (HCC)   Depression   HTN (hypertension)   COPD (chronic obstructive pulmonary disease) (HCC)   Generalized anxiety disorder   Physical deconditioning   Hypothyroidism   GERD without esophagitis   Bipolar I disorder (HCC)   Chronic diastolic heart failure (HCC)   Anemia of chronic disease   Chronic respiratory failure (HCC)   Acute on chronic respiratory failure (HCC)   COPD exacerbation (HCC)   Hypotension due to drugs   HCAP (healthcare-associated pneumonia)   ARF (acute respiratory failure) (HCC)   Rheumatic mitral stenosis   Sepsis (HCC)   Sepsis, unspecified organism (HCC)   Acute on chronic respiratory failure with hypoxia (HCC)   Lung problems from crack cocaine (HCC)   Chronic diastolic CHF (congestive heart failure) (HCC)   Substance abuse   Uncontrolled type 2 diabetes mellitus with complication (HCC)   Acute dyspnea   Encounter for palliative care   Goals of care, counseling/discussion   Advance care planning   Palliative care encounter   End of life care   Acute on chronic respiratory failure with hypoxia - Multifactorial secondary to healthcare associated pneumonia, suspected aspiration, COPD exacerbation in the setting of substance  abuse complicating underlying severe emphysema with groundglass opacifications throughout all lung fields (crack lung), 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 However desaturates rapidly with activity. - Start tapering steroids. Continue bronchodilators, flutter valve. - Palliative care team follow-up appreciated. Discussed with Dr. Linna DarnerAnwar. Hospice, residential versus home discussed and agreed by patient and family.  Crack lung-substance abuse - UDS negative for cocaine this admission.  COPD exacerbation - Improving. Taper steroids.  Healthcare associated pneumonia versus aspiration - Suspicion for crack lung. Completed course of antibiotics.  Chronic diastolic CHF - TTE 12/26/15: LVEF 60-65 percent, severe mitral stenosis and grade 2 diastolic dysfunction.  - Was on on IV Lasix. -13.935 L since admission. Improved and clinically euvolemic - IV Lasix discontinued on 7/4 due to marked contraction alkalosis.  Essential hypertension - Controlled. Except Lasix, other than antihypertensives on hold due to intermittent hypotension.  DM 2, uncontrolled  - 4/20: A1c 7.5 suggesting poor control. Reasonable inpatient control on Lantus and SSI. May have to adjust insulin's last steroids decreased.  Bipolar disorder/anxiety - Stable. Continue Abilify and Depakote with when necessary meds.  Hypothyroid - Continue Synthroid.  Hyperlipidemia  GERD - Continue PPI.  Anemia of chronic disease - Stable.  Metabolic alkalosis - Likely related to Lasix diuresis. Discontinued Lasix 7/4. Discussed with nephrology and started potassium chloride-if it doesn't improve with this, may consider acetazolamide. Follow BMP in a.m.   DVT prophylaxis: Lovenox Code Status: DO NOT RESUSCITATE Family Communication: Discussed with patient. No family  at  bedside. Disposition Plan: Residential hospice, possibly 7/5 pending bed availability.   Consultants:   Palliative care team  Procedures:   None  Antimicrobials:   Cefepime 6/26 > 7/1  Vancomycin 6/26 > 7/1   Subjective: Denies dyspnea or pain. Leg swellings improved. As per RN, anxious and screams out at times.  Objective:  Filed Vitals:   03/26/16 0454 03/26/16 0735 03/26/16 1132 03/26/16 1411  BP: 118/74   104/52  Pulse: 81   89  Temp: 98.3 F (36.8 C)   98.1 F (36.7 C)  TempSrc: Oral   Oral  Resp: 16   17  Height:      Weight:      SpO2: 96% 94% 98% 100%    Intake/Output Summary (Last 24 hours) at 03/26/16 1433 Last data filed at 03/26/16 0458  Gross per 24 hour  Intake   1070 ml  Output   1650 ml  Net   -580 ml   Filed Weights   03/24/16 0511 03/25/16 0500 03/26/16 0401  Weight: 80.7 kg (177 lb 14.6 oz) 81.647 kg (180 lb) 79.969 kg (176 lb 4.8 oz)    Examination:  General exam: Pleasant middle-aged female sitting up comfortably in bed without distress. Respiratory system: Globally distant breath sounds but clear to auscultation. Respiratory effort normal. Cardiovascular system: S1 & S2 heard, RRR. No JVD, murmurs, rubs, gallops or clicks. No leg edema.  Gastrointestinal system: Abdomen is nondistended, soft and nontender. No organomegaly or masses felt. Normal bowel sounds heard. Central nervous system: Alert and oriented. No focal neurological deficits. Extremities: Symmetric 5 x 5 power. Skin: No rashes, lesions or ulcers Psychiatry: Judgement and insight appear impaired. Mood & affect appropriate.     Data Reviewed: I have personally reviewed following labs and imaging studies  CBC:  Recent Labs Lab 03/20/16 0510 03/21/16 0548  WBC 19.1* 14.8*  NEUTROABS 18.2*  --   HGB 9.6* 9.6*  HCT 32.7* 31.9*  MCV 75.5* 75.6*  PLT 323 256   Basic Metabolic Panel:  Recent Labs Lab 03/20/16 0510 03/21/16 0548 03/24/16 0643 03/26/16 0414    NA 141 139 138 135  K 3.9 3.4* 4.3 3.5  CL 98* 93* 78* 75*  CO2 32 38* 44* >50*  GLUCOSE 207* 253* 198* 118*  BUN 26* 28* 24* 22*  CREATININE 0.76 0.81 0.65 0.67  CALCIUM 9.1 8.9 9.0 8.9  MG 2.3  --   --   --    GFR: Estimated Creatinine Clearance: 77 mL/min (by C-G formula based on Cr of 0.67). Liver Function Tests: No results for input(s): AST, ALT, ALKPHOS, BILITOT, PROT, ALBUMIN in the last 168 hours. No results for input(s): LIPASE, AMYLASE in the last 168 hours. No results for input(s): AMMONIA in the last 168 hours. Coagulation Profile: No results for input(s): INR, PROTIME in the last 168 hours. Cardiac Enzymes: No results for input(s): CKTOTAL, CKMB, CKMBINDEX, TROPONINI in the last 168 hours. BNP (last 3 results) No results for input(s): PROBNP in the last 8760 hours. HbA1C: No results for input(s): HGBA1C in the last 72 hours. CBG:  Recent Labs Lab 03/25/16 1559 03/25/16 1824 03/25/16 2138 03/26/16 0748 03/26/16 1239  GLUCAP 120* 124* 75 189* 301*   Lipid Profile: No results for input(s): CHOL, HDL, LDLCALC, TRIG, CHOLHDL, LDLDIRECT in the last 72 hours. Thyroid Function Tests: No results for input(s): TSH, T4TOTAL, FREET4, T3FREE, THYROIDAB in the last 72 hours. Anemia Panel: No results for input(s): VITAMINB12, FOLATE, FERRITIN, TIBC, IRON,  RETICCTPCT in the last 72 hours.  Sepsis Labs: No results for input(s): PROCALCITON, LATICACIDVEN in the last 168 hours.  Recent Results (from the past 240 hour(s))  Blood Culture (routine x 2)     Status: None   Collection Time: 08/09/2016  9:45 AM  Result Value Ref Range Status   Specimen Description BLOOD LEFT ANTECUBITAL  Final   Special Requests BOTTLES DRAWN AEROBIC AND ANAEROBIC 5CC  Final   Culture NO GROWTH 5 DAYS  Final   Report Status 03/23/2016 FINAL  Final  Blood Culture (routine x 2)     Status: None   Collection Time: 08/09/2016 10:14 AM  Result Value Ref Range Status   Specimen Description BLOOD  RIGHT ANTECUBITAL  Final   Special Requests BOTTLES DRAWN AEROBIC AND ANAEROBIC 5CC  Final   Culture NO GROWTH 5 DAYS  Final   Report Status 03/23/2016 FINAL  Final  Urine culture     Status: Abnormal   Collection Time: 08/09/2016 10:50 AM  Result Value Ref Range Status   Specimen Description URINE, RANDOM  Final   Special Requests NONE  Final   Culture MULTIPLE SPECIES PRESENT, SUGGEST RECOLLECTION (A)  Final   Report Status 03/19/2016 FINAL  Final  MRSA PCR Screening     Status: None   Collection Time: 08/09/2016  8:20 PM  Result Value Ref Range Status   MRSA by PCR NEGATIVE NEGATIVE Final    Comment:        The GeneXpert MRSA Assay (FDA approved for NASAL specimens only), is one component of a comprehensive MRSA colonization surveillance program. It is not intended to diagnose MRSA infection nor to guide or monitor treatment for MRSA infections.   Culture, Urine     Status: None   Collection Time: 03/19/16  8:52 PM  Result Value Ref Range Status   Specimen Description URINE, CATHETERIZED  Final   Special Requests NONE  Final   Culture NO GROWTH  Final   Report Status 03/21/2016 FINAL  Final         Radiology Studies: No results found.      Scheduled Meds: . ARIPiprazole  5 mg Oral Daily  . aspirin EC  81 mg Oral Daily  . dextromethorphan-guaiFENesin  1 tablet Oral BID  . enoxaparin (LOVENOX) injection  40 mg Subcutaneous Q24H  . insulin aspart  0-15 Units Subcutaneous TID WC  . insulin aspart  0-5 Units Subcutaneous QHS  . insulin glargine  28 Units Subcutaneous Daily  . ipratropium-albuterol  3 mL Nebulization QID  . levothyroxine  125 mcg Oral QAC breakfast  . methylPREDNISolone (SOLU-MEDROL) injection  60 mg Intravenous Q12H  .  morphine injection  2 mg Intravenous Q4H  . pantoprazole  40 mg Oral Daily  . [START ON 03/27/2016] potassium chloride SA  20 mEq Oral Daily  . potassium chloride  40 mEq Oral BID  . simvastatin  10 mg Oral QPM  . sodium chloride  flush  3 mL Intravenous Q12H  . valproate sodium  250 mg Intravenous Q12H   Continuous Infusions: . sodium chloride 10 mL/hr at 03/24/16 2057     LOS: 8 days    Time spent: 30 minutes.    N[facility] Alabama Regional Medical CenterNGALGI,Imanuel Pruiett, MD [facility] Pager [phone]  If 7PM-7AM, please contact night-coverage [email or web address] Password [email or web address] 03/26/2016, 2:33 PM

## 2016-03-26 NOTE — Progress Notes (Signed)
Patient  Screaming out constantly , encouraged to use call bell. Complaining of SOB and pain medications as ordered

## 2016-03-27 LAB — BASIC METABOLIC PANEL
Anion gap: 9 (ref 5–15)
BUN: 22 mg/dL — AB (ref 6–20)
CALCIUM: 8.8 mg/dL — AB (ref 8.9–10.3)
CO2: 49 mmol/L — ABNORMAL HIGH (ref 22–32)
CREATININE: 0.7 mg/dL (ref 0.44–1.00)
Chloride: 78 mmol/L — ABNORMAL LOW (ref 101–111)
GFR calc Af Amer: >60 mL/min (ref >60)
Glucose, Bld: <20 mg/dL — CL (ref 65–99)
Potassium: 3 mmol/L — ABNORMAL LOW (ref 3.5–5.1)
SODIUM: 136 mmol/L (ref 135–145)

## 2016-03-27 LAB — GLUCOSE, CAPILLARY
GLUCOSE-CAPILLARY: 78 mg/dL (ref 65–99)
Glucose-Capillary: 131 mg/dL — ABNORMAL HIGH (ref 65–99)
Glucose-Capillary: 120 mg/dL — ABNORMAL HIGH (ref 65–99)

## 2016-03-27 MED ORDER — DEXTROSE 50 % IV SOLN
25.0000 mL | Freq: Once | INTRAVENOUS | Status: AC
  Administered 2016-03-27: 25 mL via INTRAVENOUS
  Filled 2016-03-27: qty 50

## 2016-03-27 MED ORDER — POTASSIUM CHLORIDE CRYS ER 20 MEQ PO TBCR
40.0000 meq | EXTENDED_RELEASE_TABLET | ORAL | Status: DC
  Administered 2016-03-27: 40 meq via ORAL

## 2016-03-27 MED ORDER — INSULIN GLARGINE 100 UNIT/ML ~~LOC~~ SOLN
14.0000 [IU] | Freq: Every day | SUBCUTANEOUS | Status: DC
  Administered 2016-03-27: 14 [IU] via SUBCUTANEOUS
  Filled 2016-03-27: qty 0.14

## 2016-03-27 MED ORDER — GLYCOPYRROLATE 0.2 MG/ML IJ SOLN
0.2000 mg | INTRAMUSCULAR | Status: DC | PRN
  Administered 2016-03-28: 0.2 mg via INTRAVENOUS
  Filled 2016-03-27: qty 1

## 2016-03-27 MED ORDER — GLYCOPYRROLATE 0.2 MG/ML IJ SOLN: 0.2000 mg | INTRAMUSCULAR | Status: DC | PRN

## 2016-03-27 MED ORDER — MORPHINE SULFATE (PF) 4 MG/ML IV SOLN
4.0000 mg | INTRAVENOUS | Status: DC | PRN
  Administered 2016-03-27 – 2016-03-28 (×2): 4 mg via INTRAVENOUS
  Filled 2016-03-27 (×2): qty 1

## 2016-03-27 MED ORDER — MORPHINE SULFATE 25 MG/ML IV SOLN
3.0000 mg/h | INTRAVENOUS | Status: DC
  Administered 2016-03-27: 3 mg/h via INTRAVENOUS
  Filled 2016-03-27: qty 10

## 2016-03-27 MED ORDER — DEXTROSE-NACL 5-0.45 % IV SOLN
INTRAVENOUS | Status: AC
  Administered 2016-03-27: 17:00:00 via INTRAVENOUS

## 2016-03-27 MED ORDER — DEXAMETHASONE SODIUM PHOSPHATE 4 MG/ML IJ SOLN
4.0000 mg | Freq: Four times a day (QID) | INTRAMUSCULAR | Status: DC
  Administered 2016-03-27 – 2016-03-28 (×4): 4 mg via INTRAVENOUS
  Filled 2016-03-27 (×4): qty 1

## 2016-03-27 MED ORDER — DEXAMETHASONE SODIUM PHOSPHATE 10 MG/ML IJ SOLN
20.0000 mg | Freq: Once | INTRAMUSCULAR | Status: AC
  Administered 2016-03-27: 20 mg via INTRAVENOUS
  Filled 2016-03-27: qty 2

## 2016-03-27 MED ORDER — HALOPERIDOL LACTATE 5 MG/ML IJ SOLN: 0.5000 mg | INTRAMUSCULAR | Status: DC | PRN

## 2016-03-27 MED ORDER — GLYCOPYRROLATE 1 MG PO TABS
1.0000 mg | ORAL_TABLET | ORAL | Status: DC | PRN
  Filled 2016-03-27: qty 1

## 2016-03-27 MED ORDER — HALOPERIDOL LACTATE 2 MG/ML PO CONC
0.5000 mg | ORAL | Status: DC | PRN
  Filled 2016-03-27: qty 0.3

## 2016-03-27 MED ORDER — HALOPERIDOL 1 MG PO TABS: 0.5000 mg | ORAL_TABLET | ORAL | Status: DC | PRN

## 2016-03-27 MED ORDER — MORPHINE BOLUS VIA INFUSION
4.0000 mg | INTRAVENOUS | Status: DC | PRN
  Administered 2016-03-27 – 2016-03-28 (×4): 4 mg via INTRAVENOUS
  Filled 2016-03-27 (×5): qty 4

## 2016-03-27 NOTE — Progress Notes (Signed)
I visited with patient per referral from chaplain colleague too continue support and to follow up on  discussion regarding in hospital marriage.  During my visit palliative care staff was in consultation with patient significant other.  Palliative staff Carley Hammed(Eva) and I discussed patient and significant other conversation about getting  marriage.  I explained that they would need license in order for us to proceed further and  perform wedding.  Palliative Care team staff indicated that [facility] and if the couple still wanted to get [facility] and proceed as needed.  Patient is still laboring with SOB. Significant other at bedside. Chaplain available as needed.  Staff Chaplain Fouad Taul [place], [facility]

## 2016-03-27 NOTE — Progress Notes (Signed)
Pt's cbg this am is <20, hypoglycemic protoccol initiated, D50 25 ml IV given, rechecked cbg, result is 78, pt is more alert and verbal.

## 2016-03-27 NOTE — Progress Notes (Signed)
PROGRESS NOTE  [name]  ZOX:[id number] DOB: 1961/07/02  DOA: 03/19/2016 PCP: Dorrene GermanEdwin A Avbuere, MD   Brief Narrative:  55 y.o. female with past medical history of HTN, anemia, depression, bipolar disorder, MI, DM 2, COPD with O2 dependency 3 L/m, chronic diastolic CHF, GERD, hypothryoidism, seizure and crack/cocaine drug abuse admitted on 03/13/2016 with acute on chronic respiratory failure r/t HCAP (recently discharged from hospital on 6/21 d/t CHF/COPO exacerbation). She has had 8 hospital admissions in the last 6 months. 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    Assessment & Plan:   Active Problems:   Diabetes mellitus, insulin dependent (IDDM), uncontrolled (HCC)   Depression   HTN (hypertension)   COPD (chronic obstructive pulmonary disease) (HCC)   Generalized anxiety disorder   Physical deconditioning   Hypothyroidism   GERD without esophagitis   Bipolar I disorder (HCC)   Chronic diastolic heart failure (HCC)   Anemia of chronic disease   Chronic respiratory failure (HCC)   Acute on chronic respiratory failure (HCC)   COPD exacerbation (HCC)   Hypotension due to drugs   HCAP (healthcare-associated pneumonia)   ARF (acute respiratory failure) (HCC)   Rheumatic mitral stenosis   Sepsis (HCC)   Sepsis, unspecified organism (HCC)   Acute on chronic respiratory failure with hypoxia (HCC)   Lung problems from crack cocaine (HCC)   Chronic diastolic CHF (congestive heart failure) (HCC)   Substance abuse   Uncontrolled type 2 diabetes mellitus with complication (HCC)   Acute dyspnea   Encounter for palliative care   Goals of care, counseling/discussion   Advance care planning   Palliative care encounter   End of life care   Metabolic alkalosis   Acute on chronic respiratory failure with hypoxia - Multifactorial secondary to healthcare associated pneumonia, suspected aspiration, COPD exacerbati,  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 - Overall very poor prognosis, multiple recent intercurrent hospitalization, with severe deconditioning. - Patient with significant upper airway stridor most likely related to her cervical tracheal stenosis, palliative medicine input appreciated, continue with IV steroids to help with her symptoms. - Palliative care team follow-up appreciated. Patient is for comfort measures, the plan [facility] has been postponed given patient's frail respiratory status. - Patient appears to be with significant dyspnea and air hunger, on morphine drip, and IV steroids  COPD exacerbation - On morphine drip and IV steroids  Healthcare associated pneumonia versus aspiration - Negative for comfort measures  Chronic diastolic CHF - TTE 12/26/15: LVEF 60-65 percent, severe mitral stenosis and grade 2 diastolic dysfunction.  - IV Lasix discontinued on 7/4 due to marked contraction alkalosis. - Currently on full comfort measures  Essential hypertension - Controlled.   DM 2, uncontrolled  - Patient with hypoglycemia this a.m., until full comfort measures, off insulin  Bipolar disorder/anxiety  Hypothyroid  Hyperlipidemia  GERD  Anemia of chronic disease  Metabolic alkalosis   DVT prophylaxis: Comfort care Code Status: DO NOT RESUSCITATE Family Communication: Discussed with Mr. Grandville SilosWeeks james, her significant other at bedside Disposition Plan: Residential hospice versus hospital day  Consultants:   Palliative care team  Procedures:   None  Antimicrobials:   Cefepime 6/26 > 7/1  Vancomycin 6/26 > 7/1   Subjective: Reports dyspnea  Objective:  Filed Vitals:   03/26/16 2107 03/26/16 2158 03/27/16 0554 03/27/16 1105  BP:  125/57 129/63  Pulse:  72 72 99  Temp:  98.2 F (36.8 C) 98.9 F (37.2 C)   TempSrc:  Oral Oral   Resp:  16 20    Height:      Weight:   81.103 kg (178 lb 12.8 oz)   SpO2: 98% 99% 99% 100%    Intake/Output Summary (Last 24 hours) at 03/27/16 1601 Last data filed at 03/27/16 1439  Gross per 24 hour  Intake    180 ml  Output    550 ml  Net   -370 ml   Filed Weights   03/25/16 0500 03/26/16 0401 03/27/16 0554  Weight: 81.647 kg (180 lb) 79.969 kg (176 lb 4.8 oz) 81.103 kg (178 lb 12.8 oz)    Examination:  General exam: Laying in bed, in significant respiratory distress and audible stridor. Respiratory system: Globally distant breath sounds , and respiratory distress with use of accessory muscle, significant stridor. Cardiovascular system: S1 & S2 heard, RRR. No JVD, murmurs, rubs, gallops or clicks. No leg edema.  Gastrointestinal system: Abdomen is nondistended, soft and nontender.  Central nervous system: Alert and oriented. No focal neurological deficits. Extremities: Move all extremities without gross deficits Skin: No rashes, lesions or ulcer    Data Reviewed: I have personally reviewed following labs and imaging studies  CBC:  Recent Labs Lab 03/21/16 0548  WBC 14.8*  HGB 9.6*  HCT 31.9*  MCV 75.6*  PLT 256   Basic Metabolic Panel:  Recent Labs Lab 03/21/16 0548 03/24/16 0643 03/26/16 0414 03/27/16 0446  NA 139 138 135 136  K 3.4* 4.3 3.5 3.0*  CL 93* 78* 75* 78*  CO2 38* 44* >50* 49*  GLUCOSE 253* 198* 118* <20*  BUN 28* 24* 22* 22*  CREATININE 0.81 0.65 0.67 0.70  CALCIUM 8.9 9.0 8.9 8.8*   GFR: Estimated Creatinine Clearance: 77.5 mL/min (by C-G formula based on Cr of 0.7). Liver Function Tests: No results for input(s): AST, ALT, ALKPHOS, BILITOT, PROT, ALBUMIN in the last 168 hours. No results for input(s): LIPASE, AMYLASE in the last 168 hours. No results for input(s): AMMONIA in the last 168 hours. Coagulation Profile: No results for input(s): INR, PROTIME in the last 168 hours. Cardiac Enzymes: No results for input(s): CKTOTAL, CKMB, CKMBINDEX,  TROPONINI in the last 168 hours. BNP (last 3 results) No results for input(s): PROBNP in the last 8760 hours. HbA1C: No results for input(s): HGBA1C in the last 72 hours. CBG:  Recent Labs Lab 03/26/16 1645 03/26/16 2205 03/27/16 0721 03/27/16 0741 03/27/16 1130  GLUCAP 97 87 78 131* 120*   Lipid Profile: No results for input(s): CHOL, HDL, LDLCALC, TRIG, CHOLHDL, LDLDIRECT in the last 72 hours. Thyroid Function Tests: No results for input(s): TSH, T4TOTAL, FREET4, T3FREE, THYROIDAB in the last 72 hours. Anemia Panel: No results for input(s): VITAMINB12, FOLATE, FERRITIN, TIBC, IRON, RETICCTPCT in the last 72 hours.  Sepsis Labs: No results for input(s): PROCALCITON, LATICACIDVEN in the last 168 hours.  Recent Results (from the past 240 hour(s))  Blood Culture (routine x 2)     Status: None   Collection Time: 02/23/2016  9:45 AM  Result Value Ref Range Status   Specimen Description BLOOD LEFT ANTECUBITAL  Final   Special Requests BOTTLES DRAWN AEROBIC AND ANAEROBIC 5CC  Final   Culture NO GROWTH 5 DAYS  Final   Report Status 03/23/2016 FINAL  Final  Blood Culture (routine x 2)     Status: None   Collection Time: 02/26/2016 10:14 AM  Result Value Ref Range Status   Specimen Description BLOOD RIGHT ANTECUBITAL  Final   Special Requests BOTTLES DRAWN AEROBIC AND ANAEROBIC 5CC  Final   Culture NO GROWTH 5 DAYS  Final   Report Status 03/23/2016 FINAL  Final  Urine culture     Status: Abnormal   Collection Time: 04/17/2016 10:50 AM  Result Value Ref Range Status   Specimen Description URINE, RANDOM  Final   Special Requests NONE  Final   Culture MULTIPLE SPECIES PRESENT, SUGGEST RECOLLECTION (A)  Final   Report Status 03/19/2016 FINAL  Final  MRSA PCR Screening     Status: None   Collection Time: 04-17-2016  8:20 PM  Result Value Ref Range Status   MRSA by PCR NEGATIVE NEGATIVE Final    Comment:        The GeneXpert MRSA Assay (FDA approved for NASAL specimens only), is one  component of a comprehensive MRSA colonization surveillance program. It is not intended to diagnose MRSA infection nor to guide or monitor treatment for MRSA infections.   Culture, Urine     Status: None   Collection Time: 03/19/16  8:52 PM  Result Value Ref Range Status   Specimen Description URINE, CATHETERIZED  Final   Special Requests NONE  Final   Culture NO GROWTH  Final   Report Status 03/21/2016 FINAL  Final       Radiology Studies: No results found.    Scheduled Meds: . dexamethasone  4 mg Intravenous Q6H  . ipratropium-albuterol  3 mL Nebulization QID  . sodium chloride flush  3 mL Intravenous Q12H  . valproate sodium  250 mg Intravenous Q12H   Continuous Infusions: . sodium chloride 10 mL/hr at 03/24/16 2057  . morphine 3 mg/hr (03/27/16 1402)     LOS: 9 days    Joyann Spidle, MD [facility] Pager [phone]  If 7PM-7AM, please contact night-coverage [email or web address] Password [email or web address] 03/27/2016, 4:01 PM

## 2016-03-27 NOTE — Progress Notes (Addendum)
Daily Progress Note   Patient Name: [name]       Date: 03/27/2016 DOB: 03-21-1961  Age: 55 y.o. MRN#: [id number] Attending Physician: Starleen Arms, MD Primary Care Physician: [name], MD Admit Date: 03/04/2016  Reason for Consultation/Follow-up: Establishing goals of care and Non pain symptom management  Subjective:  HPI: 55 y.o. female with past medical history of HTN, anemia, depression, bipolar disorder, MI, COPD with O2 dependency, CHF, GERD, hypothryoidism and crack/cocaine drug abuse admitted on 03/12/2016 with acute on chronic respiratory failure r/t HCAP (recently discharged from hospital on 6/21 d/t CHF/COPO exacerbation). She has had 8 hospital admissions in the last 6 months. 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      She was lying in bed today during examination. She was in some distress from increased respiratory effort with audible wheezing. She appeared more distressed than in previous visits.   Forrestine Him from [facility] is scheduled to meet with patient and significant other regarding transfer to [facility]. There is a bed holding for her now if she and Fayrene Fearing agree.     Length of Stay: 9  Current Medications: Scheduled Meds:  . ARIPiprazole  5 mg Oral Daily  . aspirin EC  81 mg Oral Daily  . dextromethorphan-guaiFENesin  1 tablet Oral BID  . enoxaparin (LOVENOX) injection  40 mg Subcutaneous Q24H  . insulin aspart  0-15 Units Subcutaneous TID WC  . insulin glargine  14 Units Subcutaneous Daily  . ipratropium-albuterol  3 mL Nebulization QID  . levothyroxine  125 mcg Oral QAC breakfast  .  morphine injection  2 mg Intravenous Q4H  . pantoprazole  40 mg Oral Daily  . potassium chloride SA  20  mEq Oral Daily  . potassium chloride  40 mEq Oral Q4H  . predniSONE  40 mg Oral Q breakfast  . simvastatin  10 mg Oral QPM  . sodium chloride flush  3 mL Intravenous Q12H  . valproate sodium  250 mg Intravenous Q12H    Continuous Infusions: . sodium chloride 10 mL/hr at 03/24/16 2057    PRN Meds: bisacodyl, HYDROcodone-acetaminophen, ipratropium-albuterol, LORazepam, magnesium citrate, morphine injection, ondansetron **OR** ondansetron (ZOFRAN) IV, senna-docusate, traZODone  Physical Exam  Constitutional: [name] weak, pale appearing. She appears distressed.  HENT:  Head: Normocephalic and atraumatic.  Eyes: EOM are normal.  Cardiovascular: Normal rate and regular rhythm.   Pulmonary/Chest: She has wheezes.  Audible wheezing, increased effort   Skin: Skin is warm and diaphoretic  Psychiatric:  Anxious             Vital Signs: BP 129/63 mmHg  Pulse 72  Temp(Src) 98.9 F (37.2 C) (Oral)  Resp 20  Ht 5\' 2"  (1.575 m)  Wt 81.103 kg (178 lb 12.8 oz)  BMI 32.69 kg/m2  SpO2 99% SpO2: SpO2: 99 % O2 Device: O2 Device: Nasal Cannula O2 Flow Rate: O2 Flow Rate (L/min): 2 L/min  Intake/output summary:   Intake/Output Summary (Last 24 hours) at 03/27/16 1031 Last data filed at 03/27/16 0915  Gross per 24 hour  Intake     60 ml  Output    300 ml  Net   -240 ml   LBM: Last BM Date: 03/24/16 Baseline Weight: Weight: 86.1 kg (189 lb 13.1 oz) Most recent weight: Weight: 81.103 kg (178 lb 12.8 oz)       Palliative Assessment/Data: PPS: 20%   Flowsheet Rows        Most Recent Value   Intake Tab    Referral Department  Hospitalist   Unit at Time of Referral  ICU   Palliative Care Primary Diagnosis  Pulmonary   Date Notified  03/19/16   Palliative Care Type  New Palliative care   Reason for referral  Clarify Goals of Care   Date of Admission  03/09/2016   Date first seen by Palliative Care  03/20/16   # of days Palliative referral response time  1 Day(s)   # of days IP  prior to Palliative referral  1   Clinical Assessment    Palliative Performance Scale Score  30%   Psychosocial & Spiritual Assessment    Palliative Care Outcomes    Patient/Family meeting held?  Yes      Patient Active Problem List   Diagnosis Date Noted  . Metabolic alkalosis   . End of life care   . Advance care planning   . Palliative care encounter   . Acute dyspnea   . Encounter for palliative care   . Goals of care, counseling/discussion   . Acute on chronic respiratory failure with hypoxia (HCC)   . Lung problems from crack cocaine (HCC)   . Chronic diastolic CHF (congestive heart failure) (HCC)   . Substance abuse   . Uncontrolled type 2 diabetes mellitus with complication (HCC)   . Sepsis (HCC) 03/07/2016  . Sepsis, unspecified organism (HCC)   . COPD with exacerbation (HCC) 03/10/2016  . Insulin dependent diabetes mellitus (HCC)   . Dyslipidemia 03/09/2016  . Acute CHF (congestive heart failure) (HCC) 03/02/2016  . Anemia 03/02/2016  . Respiratory failure (HCC) 01/14/2016  . Hyperlipidemia 01/14/2016  . Rheumatic mitral stenosis 01/14/2016  . Acute respiratory failure with hypoxia (HCC)   . Urinary retention with incomplete bladder emptying 01/11/2016  . ARF (acute respiratory failure) (HCC) 01/11/2016  . Seizures (HCC) 01/11/2016  . Essential hypertension   . Anxiety state   . Bipolar affective disorder in remission (HCC)   . Debility 01/09/2016  . Dysphagia 01/09/2016  . ARDS (adult respiratory distress syndrome) (HCC)   . Acute encephalopathy   . HCAP (healthcare-associated pneumonia)   . Acute on chronic respiratory failure (HCC) 12/25/2015  . COPD exacerbation (HCC) 12/25/2015  . Acute on chronic congestive heart failure (HCC)   .  Bilateral edema of lower extremity   . Acute respiratory failure with hypoxemia (HCC)   . Hypotension due to drugs   . Chronic respiratory failure (HCC) 12/24/2015  . Acute on chronic diastolic CHF (congestive heart  failure), NYHA class 1 (HCC) 12/23/2015  . Anemia of chronic disease 12/23/2015  . Cellulitis of leg, left 12/23/2015  . Chronic diastolic heart failure (HCC) 10/13/2015  . DM type 2, uncontrolled, with renal complications (HCC) 10/13/2015  . Bipolar I disorder (HCC)   . Agoraphobia   . Physical deconditioning 04/21/2015  . Hypothyroidism 04/21/2015  . GERD without esophagitis 04/21/2015  . Generalized anxiety disorder 04/12/2015  . Moderate mitral stenosis 10/06/2013  . COPD (chronic obstructive pulmonary disease) (HCC) 10/04/2011  . Tobacco abuse 10/04/2011  . Diabetes mellitus, insulin dependent (IDDM), uncontrolled (HCC) 07/29/2011  . Depression 07/29/2011  . HTN (hypertension) 07/29/2011    Palliative Care Assessment & Plan   Patient Profile:  Assessment/Recommendations/Plan:  Shortness of breath:  She has significant increased work of breathing noted on examination. She does not ask for prn dosing, doesn't press call button, only calls out in her voice that is hard to hear. She needs frequent assessments for SOB and administration of morphine when she is SOB. Continue to keep fan blowing in face and thermostat on cold.  Anxiety: continue lorazepam 1 mg q2hr prn anxiety and SOB- needs frequent assessment for anxiety as she does not use call button to call for prn medication.  Advanced Care planning: DNR, likely transfer to [facility] today. Forrestine Him to meet with patient and spouse at 10am to confirm.  Goals of care: comfort care, PMT will continue to assist with symptom management as needed  Goals of Care and Additional Recommendations:  Limitations on Scope of Treatment: Avoid Hospitalization, Full Comfort Care, Minimize Medications, No Artificial Feeding, No Blood Transfusions, No Diagnostics, No Hemodialysis and No Tracheostomy  Code Status:    Code Status Orders        Start     Ordered   03/21/16 1119  Do not attempt resuscitation (DNR)   Continuous     Question Answer Comment  In the event of cardiac or respiratory ARREST Do not call a "code blue"   In the event of cardiac or respiratory ARREST Do not perform Intubation, CPR, defibrillation or ACLS   In the event of cardiac or respiratory ARREST Use medication by any route, position, wound care, and other measures to relive pain and suffering. May use oxygen, suction and manual treatment of airway obstruction as needed for comfort.      03/21/16 1118    Code Status History    Date Active Date Inactive Code Status Order ID Comments User Context   03/20/2016 11:12 AM 03/21/2016 11:18 AM Full Code [id number]  Barbara Cower, NP Inpatient   03/20/2016  8:57 AM 03/20/2016 11:12 AM DNR [id number]  Barbara Cower, NP Inpatient   04-13-16  6:16 PM 03/20/2016  8:57 AM Full Code [id number]  Marcos Eke, PA-C Inpatient   13-Apr-2016  6:16 PM 04/13/2016  6:16 PM Partial Code [id number]  Marcos Eke, PA-C Inpatient   04/13/2016  5:35 PM 04-13-16  6:16 PM Partial Code [id number]  Marcos Eke, PA-C Inpatient   04/13/16  2:05 PM 04-13-16  5:35 PM Full Code [id number]  Marcos Eke, PA-C ED   03/10/2016 12:17 AM 03/13/2016  3:06 PM Full Code [id number]  Briscoe Deutscher, MD ED   03/02/2016  6:31  AM 03/08/2016  6:40 PM Full Code [id number]  Haydee Salter, MD Inpatient   01/11/2016  5:18 PM 01/26/2016  5:57 PM Full Code [id number]  Ozella Rocks, MD Inpatient   01/09/2016  3:07 PM 01/11/2016  5:18 PM Full Code [id number]  Jacquelynn Cree, PA-C Inpatient   01/09/2016  3:07 PM 01/09/2016  3:07 PM Full Code [id number]  Jacquelynn Cree, PA-C Inpatient   12/23/2015  3:31 AM 01/09/2016  3:07 PM Full Code [id number]  Alberteen Sam, MD Inpatient   10/16/2015  9:42 PM 10/17/2015  4:30 PM Full Code [id number]  Leroy Sea, MD ED   10/12/2015 11:07 PM 10/14/2015  2:01 PM Full Code [id number]  Cammy Copa, MD Inpatient   07/29/2015  4:52 PM 07/31/2015  6:39 PM Full Code [id number]  Stephani Police, PA-C Inpatient   04/12/2015  9:11  PM 04/18/2015  6:32 PM Full Code [id number]  Stephani Police, PA-C Inpatient   08/28/2013  4:44 AM 08/30/2013  6:05 PM Full Code [id number]  Therisa Doyne, MD Inpatient   07/06/2012  3:30 PM 07/07/2012  7:57 PM Full Code [id number]  Wynetta Emery, PA-C ED   10/04/2011  7:24 AM 10/07/2011  5:45 PM Full Code [id number]  Norina Buzzard, RN ED   08/11/2011  1:11 PM 08/19/2011  6:19 PM Full Code [id number]  Standley Brooking, MD Inpatient   08/09/2011 10:43 PM 08/11/2011  1:11 PM Full Code [id number]  Rudene Christians, RN Inpatient   08/05/2011  1:16 PM 08/09/2011 10:43 PM Full Code [id number]  Conley Canal, MD Inpatient   07/29/2011  2:11 AM 07/30/2011 12:59 PM Full Code [id number]  Vida Roller, MD ED   07/28/2011  9:53 PM 07/28/2011 11:27 PM Full Code [id number]  Ahmed Prima, RN ED       Prognosis:  Patient with chronic advanced lung disease with significantly decreased functional status (PPS 20%). [name] not taking in significant nutrition nor hydration.  Prognosis is more- likely weeks. Symptom management needs are high, may consider morphine drip if SOB is not controlled with prn morphine.  Discharge Planning:  Recommend residential hospice- [facility] has a bed available per Forrestine Him, LCSW- hope for transfer today.  Care plan was discussed with Patient, SW, and bedside RN  Thank you for allowing the Palliative Medicine Team to assist in the care of this patient.   Time In: 0945 Time Out: 1025 Total Time 35 Prolonged Time Billed  No       Greater than 50%  of this time was spent counseling and coordinating care related to the above assessment and plan.    Please contact Palliative Medicine Team phone at [phone] for questions and concerns.   Ocie Bob, AGNP-C  [facility] Palliative Medicine Team [phone] See AMION for pager   Addendum:   After today's evaluation, we were called to patient's bedside by Dr. Randol Kern. Ms. Febo had an acute change in status and does  not appear stable for transfer to [facility]. Noted to have increased work of breathing, air hunger, with mental status changes. She could respond minimally to questions. Her S/O, Fayrene Fearing, was at bedside and requested to make patient comfortable as possible. Ms. Hemrick also indicated her desire for increased medication for comfort. Notified Fayrene Fearing, that by increasing medical interventions for comfort, she may become drowsy and unable to communicate. Discussed that this was likely end of life transition to actively dying. Fayrene Fearing verbalized understanding and stated  he wanted her to no longer be in pain. Patient was able to articulate to Dr. Neale BurlyFreeman that she was dying and would prefer to be comfortable. Morphine drip with q15 min bolus was started. Patient received 3 boluses before showing signs of comfort. After boluses we observed her resting comfortably with no signs of distress. I telephoned Mr. Tania AdeWeeks to inform him that Ms. Adin HectorMcBride was resting comfortably. Transfer to [facility] is on hold at this time. Anticipate hospital death. Mr. Tania AdeWeeks said to please call him when she dies, he will likely not return. Will continue to monitor and offer assistance with symptom management as needed.   Addended time in: 1515 Time Out: 1610   Greater than 50% of this time was spent counseling and coordinating care related to the above assessment and plan.

## 2016-03-27 NOTE — Discharge Instructions (Signed)
Management per [facility] °

## 2016-03-27 NOTE — Progress Notes (Signed)
Morphine gtt started. Patient is not in distress at the moment- Warehouse managerAssistance director and 2 RN's assessed patient before giving first bolus. Patient will be reassessed before future boluses. Will continue to monitor

## 2016-03-27 NOTE — Progress Notes (Signed)
Verbal orders from MD to given of oral potassium this am- but hold all other doses until 4pm. Patient then should receive another 40-80. RN will verify the exact amount to give.

## 2016-03-27 NOTE — Progress Notes (Addendum)
   03/27/16 1600  Clinical Encounter Type  Visited With Health care provider;Patient not available  Visit Type Follow-up  Referral From Palliative care team  [facility]; pt was asleep and significant other, Fayrene FearingJames not present; [facility] with staff to inform Fayrene FearingJames of what is needed to facilitate in-hospital marriage.  Fayrene FearingJames [place] what needs to be done to get license from county - likely a notarized request from both [place] and Fayrene FearingJames indicating her being medically unable/terminal to come to [place].  With document from clerk, Spiritual Care department will be able to perform marriage.  This information needs to be transmitted to Fayrene FearingJames in a clear manner to enable marriage to occur. Erline LevineMichael I Neema Fluegge 4:20 PM  [facility] spoke with Fayrene FearingJames  He was told that PT will not wake up and understands that all efforts were made to try and facilitate an in-hospital marriage [facility] and also realizes that she will likely not wake-up from her current restful sleep. 4:25 PM

## 2016-03-27 NOTE — Progress Notes (Signed)
Patient reports difficulty breathing at this time- Vitals have been checked and patient is 100% on 2L.

## 2016-03-27 NOTE — Consult Note (Signed)
[facility]. Plan is to meet with primary caregiver this morning around 10:00 if he is able to get transportation. Room is available this morning if everyone in agreement with transfer. Will update CSW after meeting with [facility]. Please don't hesitate to call with questions.    Thank you,  Forrestine Himva Audery Wassenaar, LCSW [phone]

## 2016-03-27 NOTE — Clinical Social Work Note (Signed)
[facility], CSW updated physician.  Ervin KnackEric R. Clotine Heiner, MSW, Theresia MajorsLCSWA [phone] 03/27/2016 12:19 PM

## 2016-03-28 DIAGNOSIS — R0603 Acute respiratory distress: Secondary | ICD-10-CM | POA: Insufficient documentation

## 2016-03-28 DIAGNOSIS — Z515 Encounter for palliative care: Secondary | ICD-10-CM | POA: Insufficient documentation

## 2016-04-01 ENCOUNTER — Inpatient Hospital Stay: Payer: Self-pay | Admitting: Acute Care

## 2016-04-23 NOTE — Progress Notes (Signed)
150 ML morphine wasted with Evonnie Dawesakita Butler, RN.

## 2016-04-23 NOTE — Progress Notes (Signed)
PROGRESS NOTE  [name]  ZOX:[id number] DOB: Feb 01, 1961  DOA: 04-04-2016 PCP: Dorrene GermanEdwin A Avbuere, MD   Brief Narrative:  55 y.o. female with past medical history of HTN, anemia, depression, bipolar disorder, MI, DM 2, COPD with O2 dependency 3 L/m, chronic diastolic CHF, GERD, hypothryoidism, seizure and crack/cocaine drug abuse admitted on 04-04-2016 with acute on chronic respiratory failure r/t HCAP (recently discharged from hospital on 6/21 d/t CHF/COPO exacerbation). She has had 8 hospital admissions in the last 6 months. 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  - Patient continue to have deteriorating respiratory status, limited medicine has been on falls, and following the patient and family closely, patient has been transitioned to comfort care, initial plan [facility] has been canceled given patient tenuous respiratory status,.     Assessment & Plan:   Active Problems:   Diabetes mellitus, insulin dependent (IDDM), uncontrolled (HCC)   Depression   HTN (hypertension)   COPD (chronic obstructive pulmonary disease) (HCC)   Generalized anxiety disorder   Physical deconditioning   Hypothyroidism   GERD without esophagitis   Bipolar I disorder (HCC)   Chronic diastolic heart failure (HCC)   Anemia of chronic disease   Chronic respiratory failure (HCC)   Acute on chronic respiratory failure (HCC)   COPD exacerbation (HCC)   Hypotension due to drugs   HCAP (healthcare-associated pneumonia)   ARF (acute respiratory failure) (HCC)   Rheumatic mitral stenosis   Sepsis (HCC)   Sepsis, unspecified organism (HCC)   Acute on chronic respiratory failure with hypoxia (HCC)   Lung problems from crack cocaine (HCC)   Chronic diastolic CHF (congestive heart failure) (HCC)   Substance abuse   Uncontrolled type 2 diabetes mellitus with complication (HCC)   Acute dyspnea   Encounter for palliative care  Goals of care, counseling/discussion   Advance care planning   Palliative care encounter   End of life care   Metabolic alkalosis   Terminal care   Distressed breathing   Caring for the dying   Acute on chronic respiratory failure with hypoxia - Multifactorial secondary to healthcare associated pneumonia, suspected aspiration, COPD exacerbati, 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 - Overall very poor prognosis, multiple recent intercurrent hospitalization, with severe deconditioning. - Patient with significant upper airway stridor most likely related to her cervical tracheal stenosis, palliative medicine input appreciated, continue with IV steroids to help with her symptoms. - Palliative care team follow-up appreciated. Hospital death is expected, and stable for [facility] . - On IV steroids and morphology for air hunger.  COPD exacerbation - On morphine drip and IV steroids  Healthcare associated pneumonia versus aspiration - Negative for comfort measures  Chronic diastolic CHF - TTE 12/26/15: LVEF 60-65 percent, severe mitral stenosis and grade 2 diastolic dysfunction.  - IV Lasix discontinued on 7/4 due to marked contraction alkalosis. - Currently on full comfort measures  Essential hypertension - Controlled.   DM 2, uncontrolled  - Patient with hypoglycemia this a.m., until full comfort measures, off insulin  Bipolar disorder/anxiety  Hypothyroid  Hyperlipidemia  GERD  Anemia of chronic disease  Metabolic alkalosis   DVT prophylaxis: Comfort care Code Status: DO NOT RESUSCITATE Family Communication: None at bedside Disposition Plan: Hospital death is expected.  Consultants:   Palliative care team  Procedures:   None  Antimicrobials:   Cefepime 6/26 > 7/1  Vancomycin 6/26 > 7/1   Subjective: Unresponsive.  Objective:  Filed Vitals:   03/27/16  1105 03/27/16 1954 03/27/16 2106 04/04/2016 0847  BP:  96/39    Pulse: 99     Temp:      TempSrc:      Resp:      Height:      Weight:      SpO2: 100%  94% 95%    Intake/Output Summary (Last 24 hours) at 03/27/2016 1112 Last data filed at 04/22/2016 0656  Gross per 24 hour  Intake  484.9 ml  Output    225 ml  Net  259.9 ml   Filed Weights   03/25/16 0500 03/26/16 0401 03/27/16 0554  Weight: 81.647 kg (180 lb) 79.969 kg (176 lb 4.8 oz) 81.103 kg (178 lb 12.8 oz)    Examination:  General exam: Pale, actively dieting Respiratory system: Globally distant breath sounds , abdominal breathing Cardiovascular system: S1 & S2 heard, weak pulses Gastrointestinal system: Abdomen is nondistended, soft  Central nervous system: Unresponsive. Extremities: Pale     Data Reviewed: I have personally reviewed following labs and imaging studies  CBC: No results for input(s): WBC, NEUTROABS, HGB, HCT, MCV, PLT in the last 168 hours. Basic Metabolic Panel:  Recent Labs Lab 03/24/16 0643 03/26/16 0414 03/27/16 0446  NA 138 135 136  K 4.3 3.5 3.0*  CL 78* 75* 78*  CO2 44* >50* 49*  GLUCOSE 198* 118* <20*  BUN 24* 22* 22*  CREATININE 0.65 0.67 0.70  CALCIUM 9.0 8.9 8.8*   GFR: Estimated Creatinine Clearance: 77.5 mL/min (by C-G formula based on Cr of 0.7). Liver Function Tests: No results for input(s): AST, ALT, ALKPHOS, BILITOT, PROT, ALBUMIN in the last 168 hours. No results for input(s): LIPASE, AMYLASE in the last 168 hours. No results for input(s): AMMONIA in the last 168 hours. Coagulation Profile: No results for input(s): INR, PROTIME in the last 168 hours. Cardiac Enzymes: No results for input(s): CKTOTAL, CKMB, CKMBINDEX, TROPONINI in the last 168 hours. BNP (last 3 results) No results for input(s): PROBNP in the last 8760 hours. HbA1C: No results for input(s): HGBA1C in the last 72 hours. CBG:  Recent Labs Lab 03/26/16 1645 03/26/16 2205 03/27/16 0721  03/27/16 0741 03/27/16 1130  GLUCAP 97 87 78 131* 120*   Lipid Profile: No results for input(s): CHOL, HDL, LDLCALC, TRIG, CHOLHDL, LDLDIRECT in the last 72 hours. Thyroid Function Tests: No results for input(s): TSH, T4TOTAL, FREET4, T3FREE, THYROIDAB in the last 72 hours. Anemia Panel: No results for input(s): VITAMINB12, FOLATE, FERRITIN, TIBC, IRON, RETICCTPCT in the last 72 hours.  Sepsis Labs: No results for input(s): PROCALCITON, LATICACIDVEN in the last 168 hours.  Recent Results (from the past 240 hour(s))  MRSA PCR Screening     Status: None   Collection Time: 07/08/16  8:20 PM  Result Value Ref Range Status   MRSA by PCR NEGATIVE NEGATIVE Final    Comment:        The GeneXpert MRSA Assay (FDA approved for NASAL specimens only), is one component of a comprehensive MRSA colonization surveillance program. It is not intended to diagnose MRSA infection nor to guide or monitor treatment for MRSA infections.   Culture, Urine     Status: None   Collection Time: 03/19/16  8:52 PM  Result Value Ref Range Status   Specimen Description URINE, CATHETERIZED  Final   Special Requests NONE  Final   Culture NO GROWTH  Final  Report Status 03/21/2016 FINAL  Final       Radiology Studies: No results found.    Scheduled Meds: . dexamethasone  4 mg Intravenous Q6H  . ipratropium-albuterol  3 mL Nebulization QID  . sodium chloride flush  3 mL Intravenous Q12H  . valproate sodium  250 mg Intravenous Q12H   Continuous Infusions: . sodium chloride 10 mL/hr at 03/24/16 2057  . dextrose 5 % and 0.45% NaCl 30 mL/hr at 03/27/16 1712  . morphine 3 mg/hr (03/27/16 1402)     LOS: 10 days    Huey Bienenstock, MD [facility] Pager [phone]  If 7PM-7AM, please contact night-coverage [email or web address] Password [email or web address] 2016/04/27, 11:12 AM

## 2016-04-23 NOTE — Discharge Summary (Addendum)
Brief Death Summary:  Date of death 04/12/2016  Cause of death: - cardiopulmonary arrest. Due to: - Chronic obstructive pulmonary disease - Congestive heart failure - Tracheal stenosis  Discharge diagnosis:  Acute on chronic respiratory failure with hypoxia - Multifactorial secondary to healthcare associated pneumonia, suspected aspiration, COPD exacerbation, tracheal stenosis (70% stenosis of cervical trachea noted on CT chest 6/10-probably scarring from prior intubation) COPD exacerbation Healthcare associated pneumonia versus aspiration Chronic diastolic CHF Tracheal stenosis Essential hypertension DM 2, uncontrolled  Bipolar disorder/anxiety Hypothyroid Hyperlipidemia GERD Anemia of chronic disease Metabolic alkalosis  Clinical summary:  For detailed hospital stay please check progress note dectated by me on 04/01/2016.  - Patient admitted on 02/27/2016 with acute on chronic respiratory failure r/t HCAP (recently discharged from hospital on 6/21 d/t CHF/COPO exacerbation). She has had 8 hospital admissions in the last 6 months. CT Angio on 6/17 showed tracheal stenosis, multifocal ground glass opacities, and pulmonary edema, Patient continue to have deteriorating respiratory status, palliative medicine has been consulted, and following the patient and family closely, patient has been transitioned to comfort care, initial plan to transfer to [facility] has been canceled given patient tenuous respiratory status, she was treated with  IV steroid and morphine GTT for air hunger and dyspnea, which has been managed by palliative medicine, patient expired on 03/26/2016.   Huey Bienenstockawood Elgergawy MD

## 2016-04-23 NOTE — Progress Notes (Signed)
Pt. [facility]. Successfully.

## 2016-04-23 NOTE — Progress Notes (Addendum)
Daily Progress Note   Patient Name: [name]       Date: Apr 02, 2016 DOB: [birth year]  Age: 55 y.o. MRN#: [id number] Attending Physician: Starleen Arms, MD Primary Care Physician: [name], MD Admit Date: 03/17/2016  Reason for Consultation/Follow-up: Establishing goals of care and Non pain symptom management  Subjective:  HPI: 55 y.o. female with past medical history of HTN, anemia, depression, bipolar disorder, MI, COPD with O2 dependency, CHF, GERD, hypothryoidism and crack/cocaine drug abuse admitted on 03/09/2016 with acute on chronic respiratory failure r/t HCAP (recently discharged from hospital on 6/21 d/t CHF/COPD exacerbation). She has had 8 hospital admissions in the last 6 months. 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      [name] now actively dying. Noted agonal breathing, periods of apnea, weak radial pulses. She appears to have some air hunger, almost sitting up in the bed with inspirations due to work of breathing. No morphine boluses were given over night. She did receive Ativan earlier this morning. RN administered morphine bolus while I was at bedside and this calmed breathing somewhat. At this point there does not appear to be a need to transfer to [facility].   Length of Stay: 10  Current Medications: Scheduled Meds:  . dexamethasone  4 mg Intravenous Q6H  . ipratropium-albuterol  3 mL Nebulization QID  . sodium chloride flush  3 mL Intravenous Q12H  . valproate sodium  250 mg Intravenous Q12H    Continuous Infusions: . sodium chloride 10 mL/hr at 03/24/16 2057  . dextrose 5 % and 0.45% NaCl 30 mL/hr at 03/27/16 1712  . morphine 3 mg/hr (03/27/16 1402)    PRN Meds: bisacodyl,  glycopyrrolate **OR** glycopyrrolate **OR** glycopyrrolate, haloperidol **OR** haloperidol **OR** haloperidol lactate, ipratropium-albuterol, LORazepam, morphine injection, morphine, ondansetron **OR** ondansetron (ZOFRAN) IV  Physical Exam  Constitutional: Pale, actively dying  Head: Normocephalic and atraumatic.   Cardiovascular: Normal rate and regular rhythm, weak radial pulse Pulmonary/Chest: rales, terminal secretions present, agonal breathing interspersed with shallow breaths, increased work of breathing Skin: warm, scattered bruising Neuro: nonresponsive            Vital Signs: BP 96/39 mmHg  Pulse 99  Temp(Src) 98.9 F (37.2 C) (Oral)  Resp 20  Ht 5\' 2"  (1.575 m)  Wt 81.103 kg (178 lb 12.8 oz)  BMI 32.69 kg/m2  SpO2 95% SpO2: SpO2: 95 % O2 Device: O2 Device: Nasal Cannula O2 Flow Rate: O2 Flow Rate (L/min): 2 L/min  Intake/output summary:   Intake/Output Summary (Last 24 hours) at 03/30/2016 0854 Last data filed at 03-30-2016 1610  Gross per 24 hour  Intake  664.9 ml  Output    225 ml  Net  439.9 ml   LBM: Last BM Date: 03/24/16 Baseline Weight: Weight: 86.1 kg (189 lb 13.1 oz) Most recent weight: Weight: 81.103 kg (178 lb 12.8 oz)       Palliative Assessment/Data: PPS:10%    Flowsheet Rows        Most Recent Value   Intake Tab    Referral Department  Hospitalist   Unit at Time of Referral  ICU   Palliative Care Primary Diagnosis  Pulmonary   Date Notified  03/19/16   Palliative Care Type  New Palliative care   Reason for referral  Clarify Goals of Care   Date of Admission  02/22/2016   Date first seen by Palliative Care  03/20/16   # of days Palliative referral response time  1 Day(s)   # of days IP prior to Palliative referral  1   Clinical Assessment    Palliative Performance Scale Score  30%   Psychosocial & Spiritual Assessment    Palliative Care Outcomes    Patient/Family meeting held?  Yes      Patient Active Problem List   Diagnosis Date  Noted  . Metabolic alkalosis   . End of life care   . Advance care planning   . Palliative care encounter   . Acute dyspnea   . Encounter for palliative care   . Goals of care, counseling/discussion   . Acute on chronic respiratory failure with hypoxia (HCC)   . Lung problems from crack cocaine (HCC)   . Chronic diastolic CHF (congestive heart failure) (HCC)   . Substance abuse   . Uncontrolled type 2 diabetes mellitus with complication (HCC)   . Sepsis (HCC) 03/05/2016  . Sepsis, unspecified organism (HCC)   . COPD with exacerbation (HCC) 03/10/2016  . Insulin dependent diabetes mellitus (HCC)   . Dyslipidemia 03/09/2016  . Acute CHF (congestive heart failure) (HCC) 03/02/2016  . Anemia 03/02/2016  . Respiratory failure (HCC) 01/14/2016  . Hyperlipidemia 01/14/2016  . Rheumatic mitral stenosis 01/14/2016  . Acute respiratory failure with hypoxia (HCC)   . Urinary retention with incomplete bladder emptying 01/11/2016  . ARF (acute respiratory failure) (HCC) 01/11/2016  . Seizures (HCC) 01/11/2016  . Essential hypertension   . Anxiety state   . Bipolar affective disorder in remission (HCC)   . Debility 01/09/2016  . Dysphagia 01/09/2016  . ARDS (adult respiratory distress syndrome) (HCC)   . Acute encephalopathy   . HCAP (healthcare-associated pneumonia)   . Acute on chronic respiratory failure (HCC) 12/25/2015  . COPD exacerbation (HCC) 12/25/2015  . Acute on chronic congestive heart failure (HCC)   . Bilateral edema of lower extremity   . Acute respiratory failure with hypoxemia (HCC)   . Hypotension due to drugs   . Chronic respiratory failure (HCC) 12/24/2015  . Acute on chronic diastolic CHF (congestive heart failure), NYHA class 1 (HCC) 12/23/2015  . Anemia of chronic disease 12/23/2015  . Cellulitis of leg, left 12/23/2015  . Chronic diastolic heart failure (HCC) 10/13/2015  . DM type 2, uncontrolled, with renal complications (HCC) 10/13/2015  . Bipolar  I disorder  (HCC)   . Agoraphobia   . Physical deconditioning 04/21/2015  . Hypothyroidism 04/21/2015  . GERD without esophagitis 04/21/2015  . Generalized anxiety disorder 04/12/2015  . Moderate mitral stenosis 10/06/2013  . COPD (chronic obstructive pulmonary disease) (HCC) 10/04/2011  . Tobacco abuse 10/04/2011  . Diabetes mellitus, insulin dependent (IDDM), uncontrolled (HCC) 07/29/2011  . Depression 07/29/2011  . HTN (hypertension) 07/29/2011    Palliative Care Assessment & Plan   Patient Profile:  Assessment/Recommendations/Plan:  Shortness of breath:  She has significant increased work of breathing noted on examination. Continue Morphine drip 3mg /hr with 4mg  bolus q3915mins prn. Continue to keep fan blowing in face and thermostat on cold.  Anxiety: continue lorazepam 1 mg q2hr prn anxiety and SOB.  Terminal secretions: continue Robinol .2mg  IV q4hrs prn  Advanced Care planning: DNR, Anticipate hospital death, no need for transfer to [facility] at this point.  Goals of care: comfort care only, PMT will continue to assist with symptom management as needed  Goals of Care and Additional Recommendations:  Limitations on Scope of Treatment: Avoid Hospitalization, Full Comfort Care, Minimize Medications, No Artificial Feeding, No Blood Transfusions, No Diagnostics, No Hemodialysis and No Tracheostomy  Code Status:    Code Status Orders        Start     Ordered   03/21/16 1119  Do not attempt resuscitation (DNR)   Continuous    Question Answer Comment  In the event of cardiac or respiratory ARREST Do not call a "code blue"   In the event of cardiac or respiratory ARREST Do not perform Intubation, CPR, defibrillation or ACLS   In the event of cardiac or respiratory ARREST Use medication by any route, position, wound care, and other measures to relive pain and suffering. May use oxygen, suction and manual treatment of airway obstruction as needed for comfort.      03/21/16 1118    Code  Status History    Date Active Date Inactive Code Status Order ID Comments User Context   03/20/2016 11:12 AM 03/21/2016 11:18 AM Full Code [id number]  Barbara CowerKasie J Kairi Harshbarger, NP Inpatient   03/20/2016  8:57 AM 03/20/2016 11:12 AM DNR [id number]  Barbara CowerKasie J Johnsie Moscoso, NP Inpatient   03-Oct-2015  6:16 PM 03/20/2016  8:57 AM Full Code [id number]  Marcos EkeSara E Wertman, PA-C Inpatient   03-Oct-2015  6:16 PM 03-Oct-2015  6:16 PM Partial Code [id number]  Marcos EkeSara E Wertman, PA-C Inpatient   03-Oct-2015  5:35 PM 03-Oct-2015  6:16 PM Partial Code [id number]  Marcos EkeSara E Wertman, PA-C Inpatient   03-Oct-2015  2:05 PM 03-Oct-2015  5:35 PM Full Code [id number]  Marcos EkeSara E Wertman, PA-C ED   03/10/2016 12:17 AM 03/13/2016  3:06 PM Full Code [id number]  Briscoe Deutscherimothy S Opyd, MD ED   03/02/2016  6:31 AM 03/08/2016  6:40 PM Full Code [id number]  Haydee SalterPhillip M Hobbs, MD Inpatient   01/11/2016  5:18 PM 01/26/2016  5:57 PM Full Code [id number]  Ozella Rocksavid J Merrell, MD Inpatient   01/09/2016  3:07 PM 01/11/2016  5:18 PM Full Code [id number]  Jacquelynn CreePamela S Love, PA-C Inpatient   01/09/2016  3:07 PM 01/09/2016  3:07 PM Full Code [id number]  Jacquelynn CreePamela S Love, PA-C Inpatient   12/23/2015  3:31 AM 01/09/2016  3:07 PM Full Code [id number]  Alberteen Samhristopher P Danford, MD Inpatient   10/16/2015  9:42 PM 10/17/2015  4:30 PM Full Code [id number]  Leroy SeaPrashant K Singh, MD ED   10/12/2015 11:07 PM 10/14/2015  2:01 PM Full Code  [id number]  Cammy CopaScott Gregory Dean, MD Inpatient   07/29/2015  4:52 PM 07/31/2015  6:39 PM Full Code [id number]  [name] PoliceMarianne L York, PA-C Inpatient   04/12/2015  9:11 PM 04/18/2015  6:32 PM Full Code [id number]  [name] PoliceMarianne L York, PA-C Inpatient   08/28/2013  4:44 AM 08/30/2013  6:05 PM Full Code [id number]  Therisa DoyneAnastassia Doutova, MD Inpatient   07/06/2012  3:30 PM 07/07/2012  7:57 PM Full Code [id number]  Wynetta EmeryNicole Pisciotta, PA-C ED   10/04/2011  7:24 AM 10/07/2011  5:45 PM Full Code [id number]  Norina BuzzardAshley Lavendra Kee, RN ED   08/11/2011  1:11 PM 08/19/2011  6:19 PM Full Code [id number]  Standley Brookinganiel P Goodrich, MD Inpatient   08/09/2011 10:43 PM 08/11/2011   1:11 PM Full Code [id number]  Rudene Christiansharity A Ingram, RN Inpatient   08/05/2011  1:16 PM 08/09/2011 10:43 PM Full Code [id number]  Conley CanalSimbiso Ranga, MD Inpatient   07/29/2011  2:11 AM 07/30/2011 12:59 PM Full Code [id number]  Vida RollerBrian D Miller, MD ED   07/28/2011  9:53 PM 07/28/2011 11:27 PM Full Code [id number]  Ahmed Primaachel Gayle Hayden, RN ED       Prognosis:  Patient with chronic advanced lung disease with significantly decreased functional status (PPS 10%). [name] now actively dying. Symptom management needs are high.  Discharge Planning:  Anticipate hospital death  [facility]  Care plan was discussed with  bedside RN, Dr. Romie MinusGene Freeman  Thank you for allowing the Palliative Medicine Team to assist in the care of this patient.   Time In: 0745 Time Out: 0815 Total Time 30 Prolonged Time Billed  No       Greater than 50%  of this time was spent counseling and coordinating care related to the above assessment and plan.    Please contact Palliative Medicine Team phone at [phone] for questions and concerns.   Ocie BobKasie Antolin Belsito, AGNP-C  [facility] Palliative Medicine Team [phone]- team phone See AMION for pager

## 2016-04-23 DEATH — deceased
